# Patient Record
Sex: Male | Born: 1940
Health system: Southern US, Community
[De-identification: ages and names within clinical notes are randomized; demographics above are authoritative.]

## PROBLEM LIST (undated history)

## (undated) DIAGNOSIS — I10 Essential (primary) hypertension: Secondary | ICD-10-CM

## (undated) DIAGNOSIS — M1990 Unspecified osteoarthritis, unspecified site: Secondary | ICD-10-CM

## (undated) DIAGNOSIS — K264 Chronic or unspecified duodenal ulcer with hemorrhage: Secondary | ICD-10-CM

## (undated) DIAGNOSIS — K222 Esophageal obstruction: Secondary | ICD-10-CM

## (undated) DIAGNOSIS — K259 Gastric ulcer, unspecified as acute or chronic, without hemorrhage or perforation: Secondary | ICD-10-CM

## (undated) DIAGNOSIS — K209 Esophagitis, unspecified: Secondary | ICD-10-CM

## (undated) DIAGNOSIS — K449 Diaphragmatic hernia without obstruction or gangrene: Secondary | ICD-10-CM

## (undated) HISTORY — DX: Unspecified osteoarthritis, unspecified site: M19.90

## (undated) HISTORY — PX: COLONOSCOPY: SHX174

## (undated) HISTORY — DX: Gastric ulcer, unspecified as acute or chronic, without hemorrhage or perforation: K25.9

## (undated) HISTORY — PX: REPLACEMENT TOTAL KNEE: SUR1224

---

## 1973-05-07 HISTORY — PX: HERNIA REPAIR: SHX51

## 1998-03-03 ENCOUNTER — Inpatient Hospital Stay (HOSPITAL_COMMUNITY): Admission: EM | Admit: 1998-03-03 | Discharge: 1998-03-05 | Payer: Self-pay | Admitting: Emergency Medicine

## 1998-03-04 ENCOUNTER — Encounter: Payer: Self-pay | Admitting: *Deleted

## 1998-04-05 ENCOUNTER — Encounter: Payer: Self-pay | Admitting: Emergency Medicine

## 1998-04-05 ENCOUNTER — Inpatient Hospital Stay (HOSPITAL_COMMUNITY): Admission: EM | Admit: 1998-04-05 | Discharge: 1998-04-09 | Payer: Self-pay | Admitting: Emergency Medicine

## 1998-04-06 ENCOUNTER — Encounter: Payer: Self-pay | Admitting: Emergency Medicine

## 1998-04-07 ENCOUNTER — Encounter: Payer: Self-pay | Admitting: *Deleted

## 1999-12-04 ENCOUNTER — Encounter: Payer: Self-pay | Admitting: Emergency Medicine

## 1999-12-04 ENCOUNTER — Inpatient Hospital Stay (HOSPITAL_COMMUNITY): Admission: EM | Admit: 1999-12-04 | Discharge: 1999-12-06 | Payer: Self-pay | Admitting: Emergency Medicine

## 2000-04-07 ENCOUNTER — Emergency Department (HOSPITAL_COMMUNITY): Admission: EM | Admit: 2000-04-07 | Discharge: 2000-04-07 | Payer: Self-pay | Admitting: Emergency Medicine

## 2000-05-07 HISTORY — PX: OTHER SURGICAL HISTORY: SHX169

## 2000-11-17 ENCOUNTER — Emergency Department (HOSPITAL_COMMUNITY): Admission: EM | Admit: 2000-11-17 | Discharge: 2000-11-17 | Payer: Self-pay | Admitting: Unknown Physician Specialty

## 2003-10-15 ENCOUNTER — Emergency Department (HOSPITAL_COMMUNITY): Admission: EM | Admit: 2003-10-15 | Discharge: 2003-10-15 | Payer: Self-pay | Admitting: *Deleted

## 2003-10-22 ENCOUNTER — Ambulatory Visit (HOSPITAL_COMMUNITY): Admission: RE | Admit: 2003-10-22 | Discharge: 2003-10-22 | Payer: Self-pay | Admitting: Orthopaedic Surgery

## 2003-11-02 ENCOUNTER — Encounter: Admission: RE | Admit: 2003-11-02 | Discharge: 2004-01-31 | Payer: Self-pay | Admitting: Orthopaedic Surgery

## 2008-01-22 ENCOUNTER — Inpatient Hospital Stay (HOSPITAL_COMMUNITY): Admission: RE | Admit: 2008-01-22 | Discharge: 2008-01-28 | Payer: Self-pay | Admitting: Orthopaedic Surgery

## 2008-01-26 ENCOUNTER — Ambulatory Visit: Payer: Self-pay | Admitting: Vascular Surgery

## 2008-01-26 ENCOUNTER — Encounter (INDEPENDENT_AMBULATORY_CARE_PROVIDER_SITE_OTHER): Payer: Self-pay | Admitting: Orthopaedic Surgery

## 2008-02-03 ENCOUNTER — Inpatient Hospital Stay (HOSPITAL_COMMUNITY): Admission: EM | Admit: 2008-02-03 | Discharge: 2008-02-11 | Payer: Self-pay | Admitting: Emergency Medicine

## 2008-02-03 ENCOUNTER — Ambulatory Visit: Payer: Self-pay | Admitting: Infectious Diseases

## 2008-02-06 ENCOUNTER — Encounter: Payer: Self-pay | Admitting: Infectious Diseases

## 2008-04-28 ENCOUNTER — Encounter: Admission: RE | Admit: 2008-04-28 | Discharge: 2008-04-28 | Payer: Self-pay | Admitting: Orthopedic Surgery

## 2009-12-06 IMAGING — CR DG CHEST 2V
2 series · 2 of 2 positions shown · non-contrast
Comparison: None

CLINICAL DATA: Preop chest radiograph

CHEST - 2 VIEW

[view not recorded (1 of 2)]
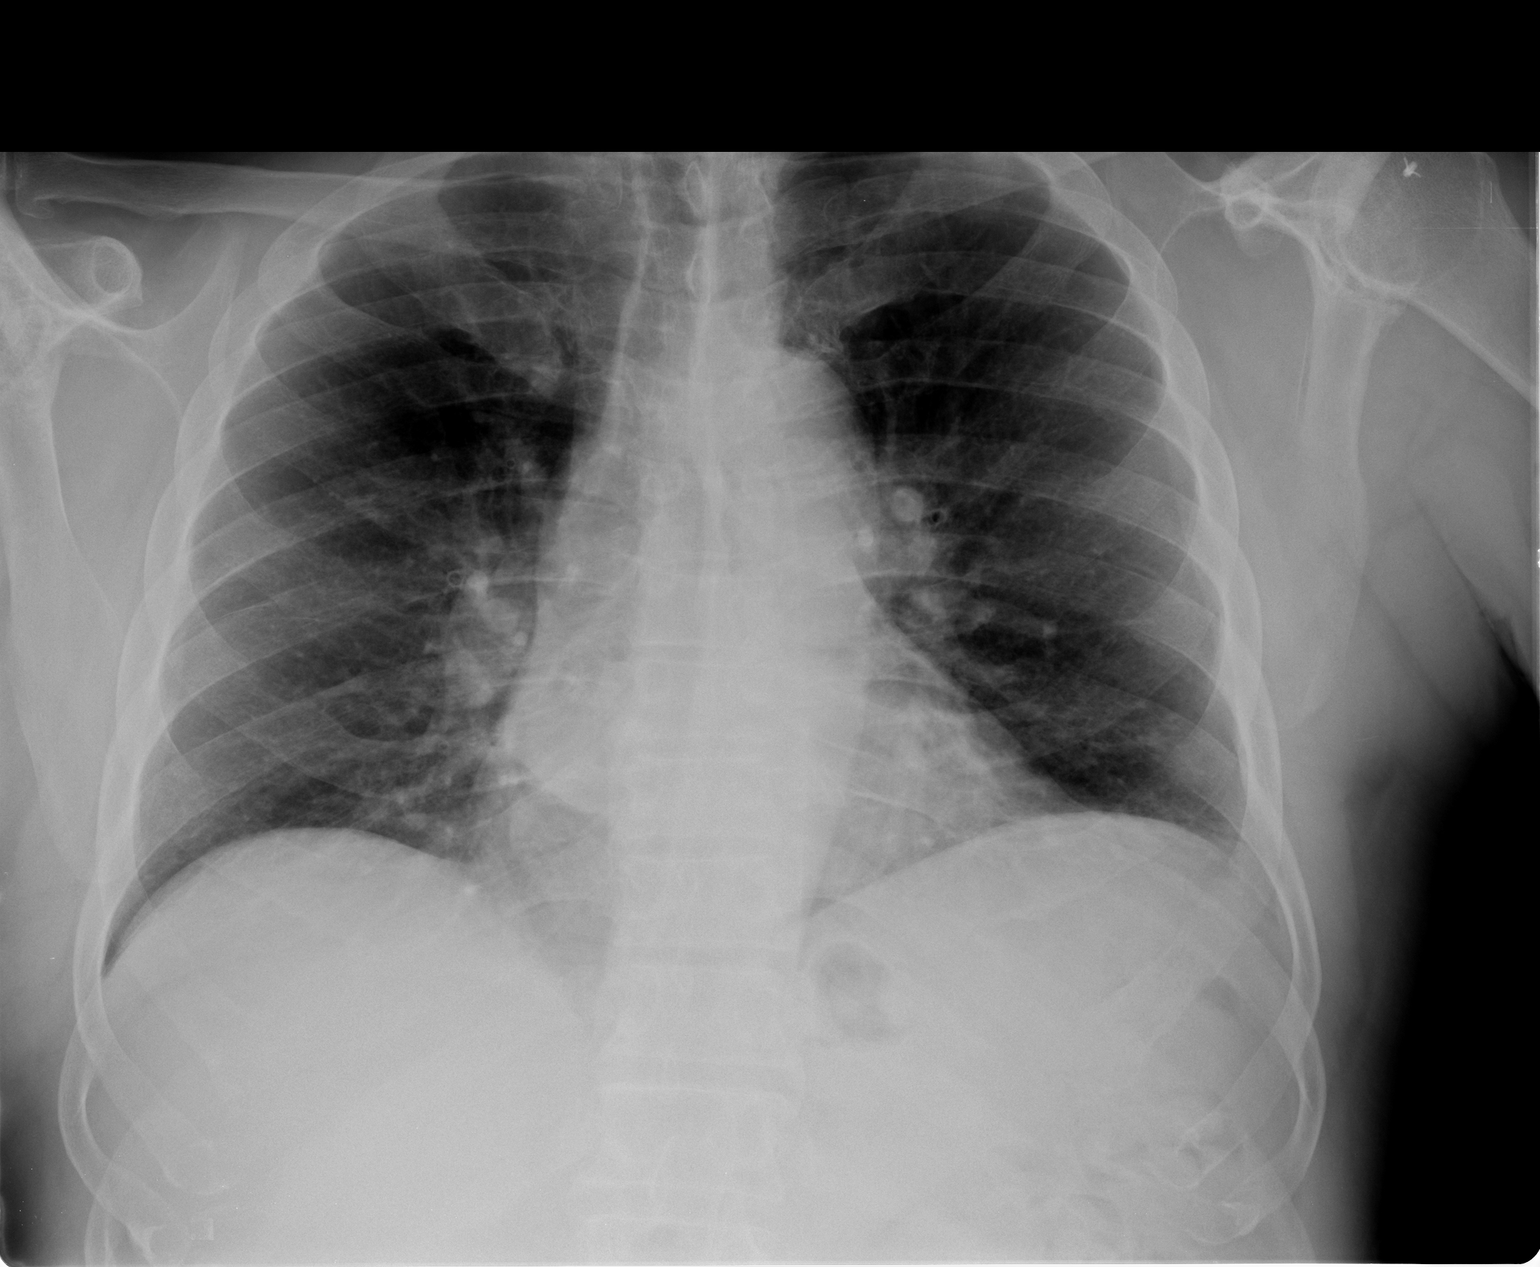

[view not recorded (2 of 2)]
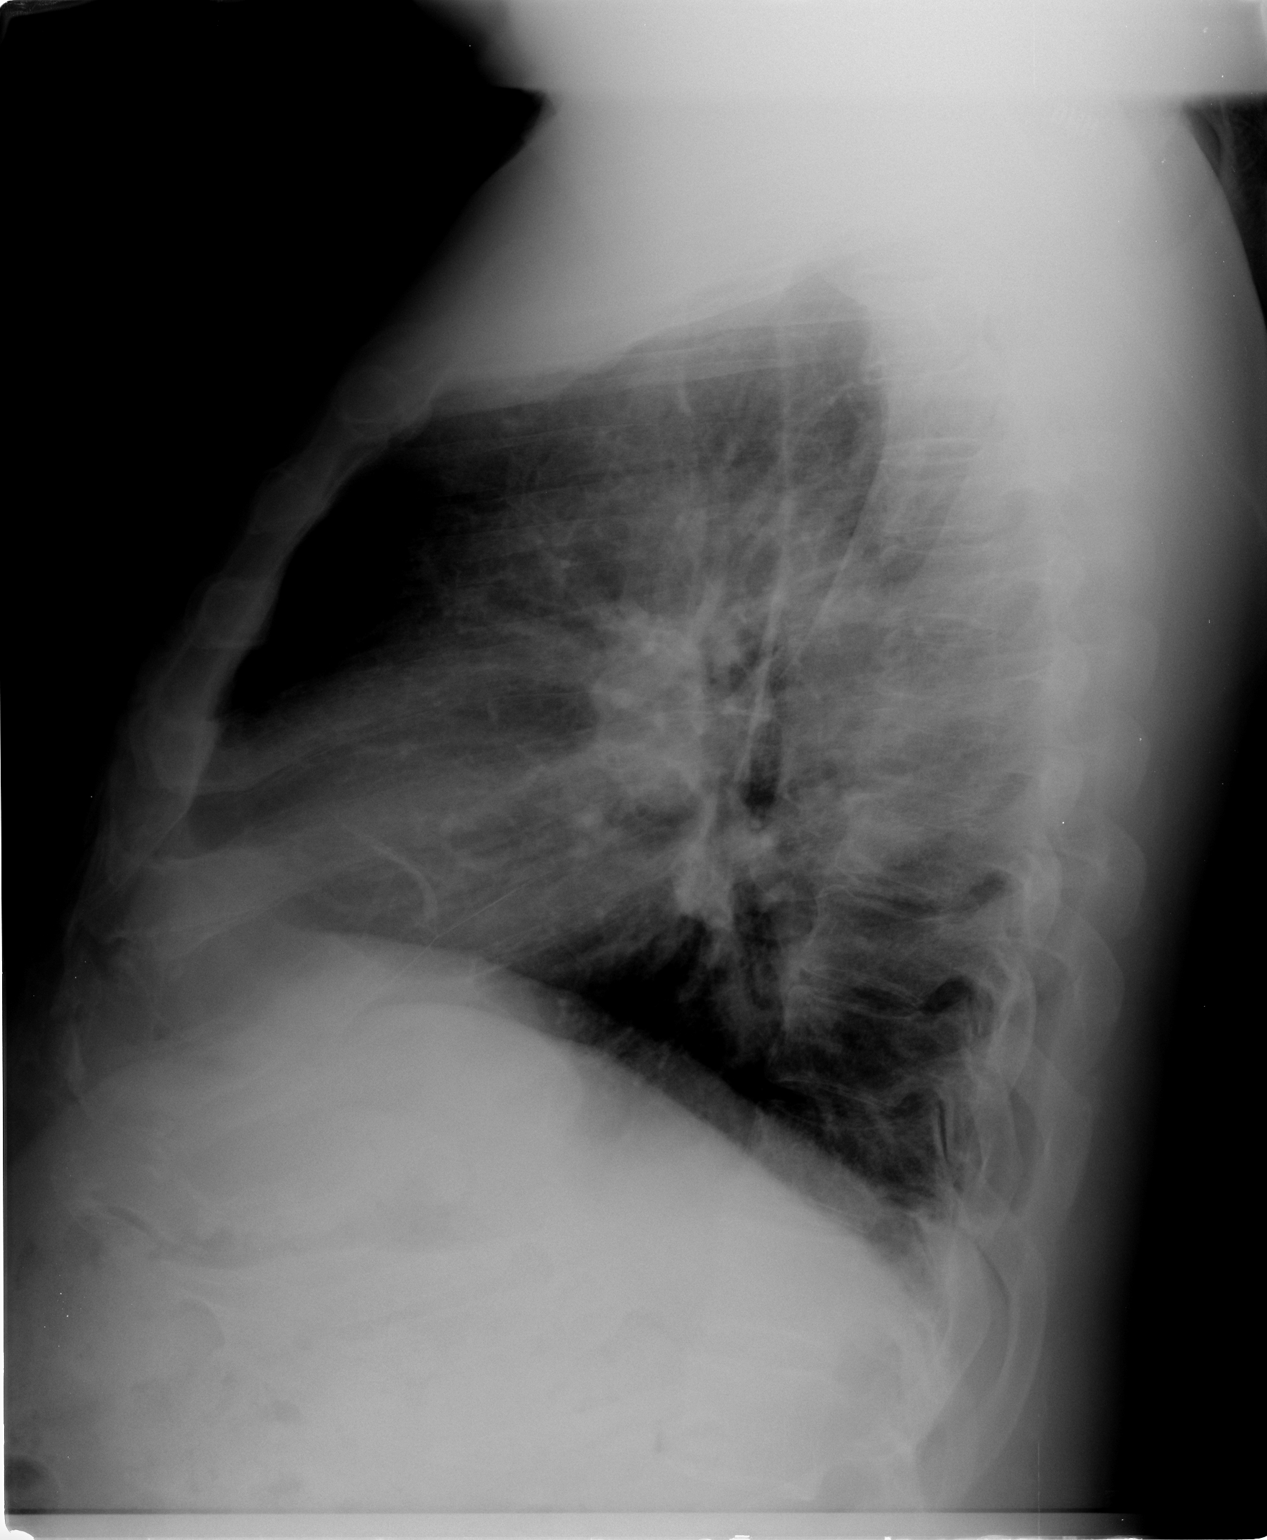

[2 of 2 positions shown; findings below may reference images not displayed]

FINDINGS: Heart size is normal.

There is no pleural effusion or pulmonary interstitial edema

No airspace densities identified.

There is a right hilar prominence which is best appreciated on the
lateral view.

No airspace densities or pulmonary masses noted.
IMPRESSION: 1.  Right hilar prominence, cannot rule out adenopathy..  Recommend
CT of the chest for more definitive evaluation.
2.  No acute airspace density.

## 2009-12-15 IMAGING — CR DG CHEST 2V
1 series · 1 of 1 positions shown · non-contrast
Comparison: Chest radiograph 01/16/2008

CLINICAL DATA: DJD left knee, productive cough

CHEST - 2 VIEW

[view not recorded]
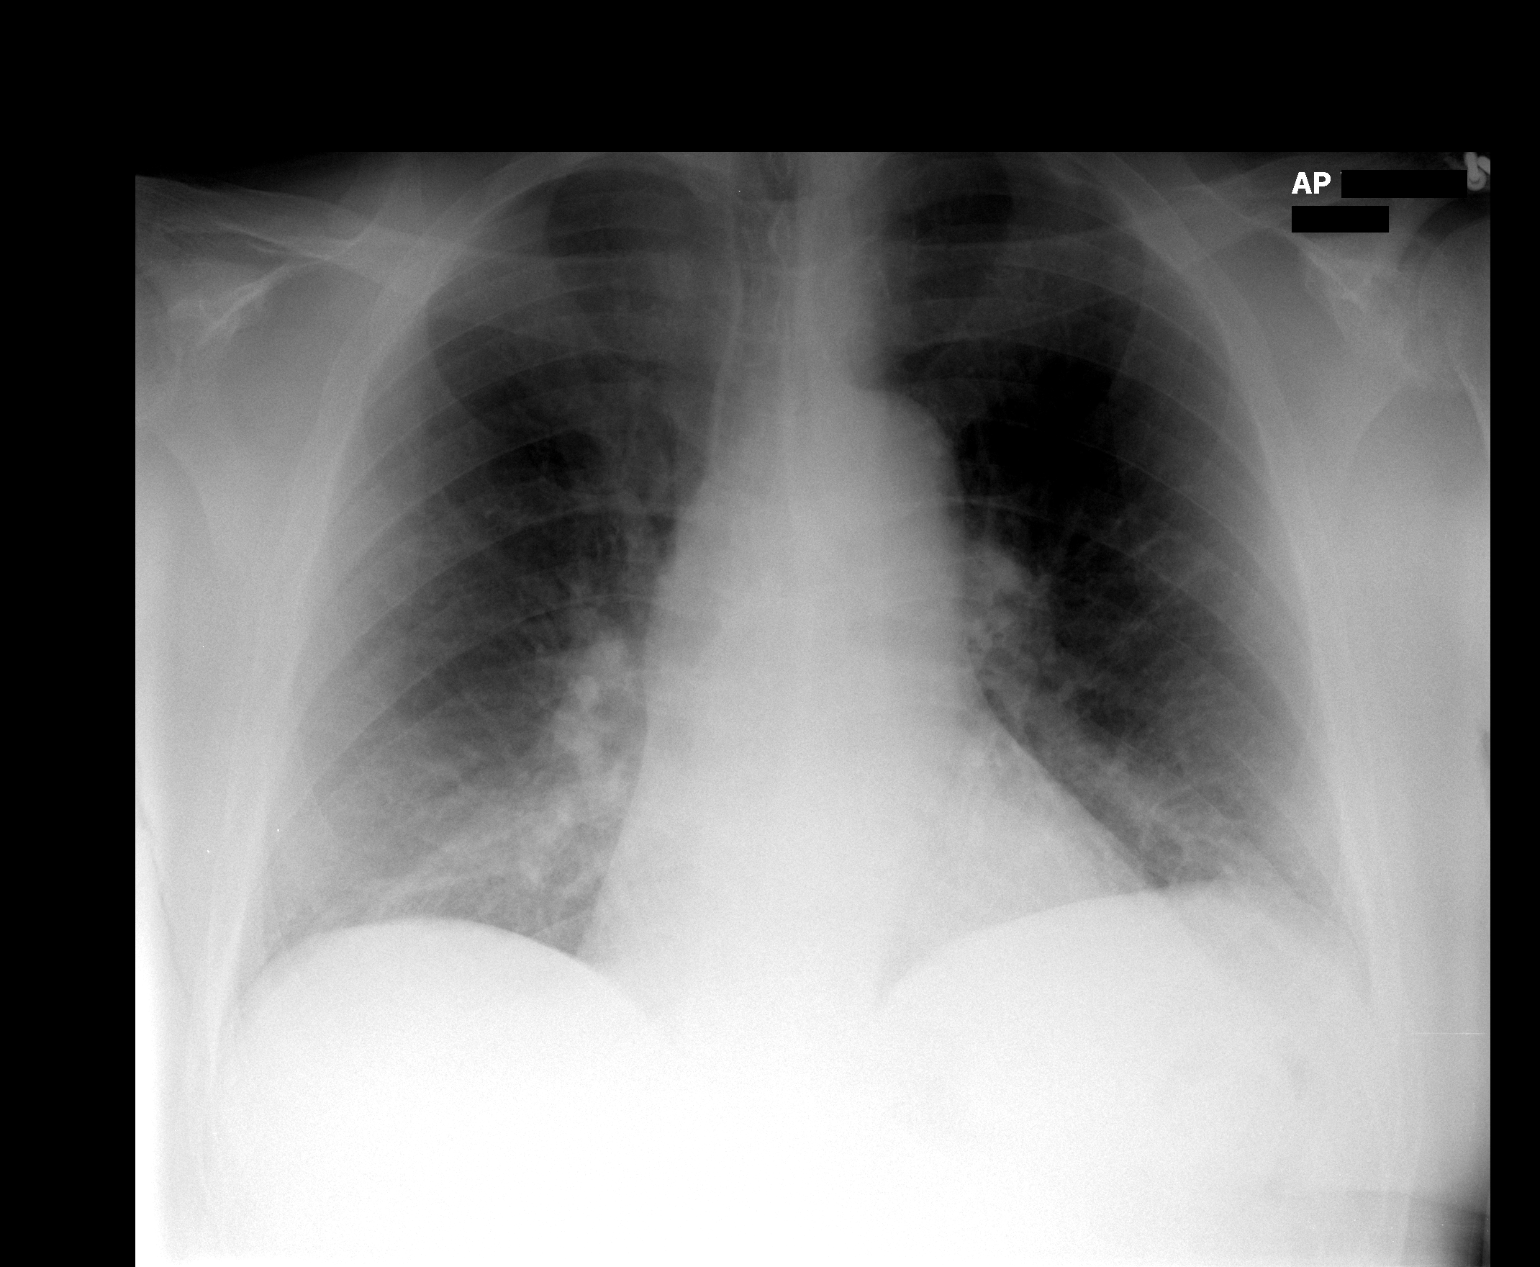

[1 of 1 positions shown; findings below may reference images not displayed]

FINDINGS: Mildly enlarged cardiac silhouette.  There are linear
markings at the bilateral lung bases slightly increased from prior.
No evidence of pulmonary edema.  No pneumothorax.  Lateral
projection demonstrates a potential 3 cm opacity projecting over
the anterior border of the thoracic spine.
IMPRESSION: 1..  Increasing bilateral lobe linear opacities representing
atelectasis or infiltrates.

2.  Potential 3 cm opacity projecting over the lower lobes.    This
may represent a focus of infection however cannot exclude a
pulmonary mass or lymph node.  Recommend either repeat PA and
lateral radiographs in short term or consider CT thorax.

## 2010-04-08 ENCOUNTER — Emergency Department (HOSPITAL_COMMUNITY)
Admission: EM | Admit: 2010-04-08 | Discharge: 2010-04-08 | Payer: Self-pay | Source: Home / Self Care | Admitting: Emergency Medicine

## 2010-07-18 LAB — GC/CHLAMYDIA PROBE AMP, GENITAL
Chlamydia, DNA Probe: NEGATIVE
GC Probe Amp, Genital: NEGATIVE

## 2010-09-19 NOTE — Op Note (Signed)
NAME:  Adrian Miller, WOJNAROWSKI NO.:  [id number]   MEDICAL RECORD NO.:  [id number]          PATIENT TYPE:  INP   LOCATION:  5033                         FACILITY:  [facility]   PHYSICIAN:  Lubertha Basque. Dalldorf, M.D.DATE OF BIRTH:  Apr 07, 1941   DATE OF PROCEDURE:  01/22/2008  DATE OF DISCHARGE:                               OPERATIVE REPORT   PREOPERATIVE DIAGNOSIS:  Left knee degenerative arthritis.   POSTOPERATIVE DIAGNOSIS:  Left knee degenerative arthritis.   PROCEDURE:  Left total knee replacement.   ANESTHESIA:  General and block.   ATTENDING SURGEON:  Lubertha Basque. Jerl Santos, MD   ASSISTANT:  Lindwood Qua, PA   INDICATIONS FOR PROCEDURE:  The patient is a 70 year old male with a  long history of painful knees.  By x-ray he has advanced degenerative  change, medial and patellofemoral.  He has failed oral anti-  inflammatories and injections.  He has pain which limits his ability to  rest and walk.  He is offered a knee replacement to the most painful  side which is the left.  Informed operative consent was obtained after  discussion of possible complications of reaction to anesthesia,  infection, DVT, PE, and death.   SUMMARY, FINDINGS, AND PROCEDURE:  Under general anesthesia and a block,  a left knee replacement was performed through a standard anterior  longitudinal incision and medial parapatellar approach.  He had advanced  degenerative change, medial and patellofemoral, but excellent bone  quality.  We addressed this problem with a cemented DePuy LCS system  using a large femur, 10-mm deep dish spacer, 5 tibia, and 38 mm all-  polyethylene patella.  We did include antibiotic in the cement.  Lindwood Qua assisted throughout and was invaluable to the completion of the  case in that he helped to position and retract while I performed the  procedure.  He also closed simultaneously to help minimize OR time.   DESCRIPTION OF PROCEDURE:  The patient was taken to  the operating suite  where general anesthetic was applied without difficulty.  He was also  given a block in the preanesthesia area.  He was positioned in supine  and prepped and draped in the normal sterile fashion.  After the  administration of preop IV Kefzol, the left leg was elevated,  exsanguinated, and tourniquet was inflated about the thigh.  A  longitudinal anterior incision was made with dissection down to the  extensor mechanism.  A medial parapatellar incision was made in this  structure.  The kneecap was flipped and the knee was flexed.  Residual  meniscal tissues were removed along with ACL and PCL.  Findings were as  noted above.  We made a cut on the tibia with an extramedullary guide  with a slight posterior tilt.  We then made a cut on the femur with an  intramedullary guide, removing an anterior-posterior bone, creating a  flexion gap of 10 mm.  We then made a second cut on the femur with a  distal guide also intramedullary.  This cut balanced the knee at 10 mm.  The femur  sized to a large and the tibia to a 4 and the appropriate  guides were placed and utilized.  The patella was cut down to thickness  by 9 mm to 15 and sized to a 38 with the appropriate guide placed and  utilized.  A trial reduction was done with these components.  The knee  easily came to full extension and flexed well and the patella tracked  well.  Trial components were removed followed by pulsatile lavage and  irrigation of 3 cut bony surfaces.  Cement was mixed including Zinacef  antibiotic 1.5 g.  The cement was pressurized onto the bones followed by  placement of the aforementioned DePuy LCS components.  Pressure was held  on the components until cement hardened.  Excess cement was trimmed.  The tourniquet was deflated and a small amount of bleeding was easily  controlled with Bovie cautery.  The knee was irrigated followed by  placement of drain exiting superolaterally.  The extensor mechanism  was  reapproximated with #1 Vicryl interrupted fashion followed by  subcutaneous reapproximation with 0 and 2-0 undyed Vicryl and skin  closure with staples.  The knee was easily flexed to 120 against gravity  at the end of the case.  Estimated blood loss and intraoperative fluids  could be obtained from anesthesia records as could the accurate  tourniquet time.   DISPOSITION:  The patient was extubated in the operating room and taken  to the recovery room in stable addition.  He is to be admitted to the  Orthopedic Surgery Service for appropriate postop care to include  perioperative antibiotics and Coumadin plus Lovenox for DVT prophylaxis.      Lubertha Basque Jerl Santos, M.D.  Electronically Signed     PGD/MEDQ  D:  01/22/2008  T:  01/23/2008  Job:  [id number]

## 2010-09-19 NOTE — Consult Note (Signed)
NAME:  Adrian Miller, Adrian Miller NO.:  [id number]   MEDICAL RECORD NO.:  [id number]          PATIENT TYPE:  INP   LOCATION:  5033                         FACILITY:  [facility]   PHYSICIAN:  Isidor Holts, M.D.  DATE OF BIRTH:  12-28-1940   DATE OF CONSULTATION:  01/26/2008  DATE OF DISCHARGE:                                 CONSULTATION   PMD:  Lubertha Basque. Jerl Santos, M.D.   REQUESTING PHYSICIAN:  Lubertha Basque. Jerl Santos, M.D., orthopedic surgeon.   REASON FOR CONSULTATION:  Possible chest infection.   HISTORY OF PRESENT ILLNESS:  This is a 70 year old male admitted to  [facility] on January 22, 2008, for end-stage  osteoarthritis of his left knee and is now status post left knee total  knee replacement on January 22, 2008.  We are requested for consult  for possible chest infection.  On detailed questioning, patient admits  to cough productive of mucky gray-brown phlegm for three days,  associated with fever, sweats, a feeling of cold.  He also has  bilateral pleuritic-type chest pain.   PAST MEDICAL HISTORY:  1. Hypertension.  2. Dyslipidemia.  3. Coronary artery disease, status post NSTEMI in 1999.  4. Status post coronary catheterization on November 25, 1999, which showed      nonobstructive coronary disease and diastolic dysfunction.  5. History of skull trauma in 1994, i.e., occupational.  6. Smoking history.  7. Osteoarthritis.  8. Erectile dysfunction.   PRE-ADMISSION MEDICATIONS:  1. Viagra 100 mg p.o. p.r.n.  2. Tramadol 50 mg p.o. p.r.n. q.6h.  3. Hydrochlorothiazide 25 mg p.o. daily.  4. Tarka-240, 1 p.o. daily.  5. Goody's powders p.r.n.   CURRENT MEDICATIONS:  1. Therapeutic Lovenox.  2. Coumadin per INR.  3. Dilaudid PCA.  4. Colace 100 mg p.o. b.i.d.  5. Ferrous sulfate 325 mg p.o. t.i.d.  6. Verapamil 240 mg p.o. daily.  7. Trandolapril 4 mg p.o. daily.   ALLERGIES:  No known drug allergies.   REVIEW OF SYSTEMS:  Patient denies  abdominal pain, vomiting, or  diarrhea.   SOCIAL HISTORY:  Patient is a smoker, and has been smoking for many  years.  Continues to smoke about a half a pack of cigarettes per day.  Alcohol:  Drinks only occasionally.   FAMILY HISTORY:  This is positive for coronary artery disease, patient's  sister having died status post MI at age 38; however, noncontributory.   PHYSICAL EXAMINATION:  VITALS:  Temperature 99.4 (was 101.5 on January 25, 2008).  Pulse 88 per minute.  Respiratory 18.  BP 108/56 mmHg.  Pulse oximetry 95% on room air.  Patient did not appear to be in obvious acute distress at the time of  this evaluation.  Alert, communicative.  Not short of breath at rest.  HEENT:  No clinical pallor.  No jaundice.  No conjunctival injection.  Throat is clear.  NECK:  Supple.  JVP not seen.  No palpable lymphadenopathy.  No palpable  goiter.  CHEST:  Clinically clear to auscultation.  No wheezes, no crackles.  HEART:  Sounds S1 and  S2 heard.  Normal, regular, no murmurs.  ABDOMEN:  Full, soft, nontender.  No palpable organomegaly.  No palpable  masses.  Normal bowel sounds.  LOWER EXTREMITIES:  Right lower extremity is unremarkable.  Left lower  extremity:  Postop dressing is noted over the knee incision.  Patient  has moderate edema, left lower extremity and a knot that is palpable in  the popliteal fossa, which might represent superficial thrombophlebitis.  CENTRAL NERVOUS SYSTEM:  No focal neurologic deficit on gross  examination.   INVESTIGATIONS:  CBC:  WBC 16, hemoglobin 11.5, hematocrit 34.4,  platelets 260, INR 1.5.  Electrolytes on January 25, 2008 shows sodium  136, potassium 3.9, chloride 100, CO2 27, BUN 15, creatinine 1.36,  glucose 104.  Urinalysis shows WBCs 0-2, otherwise negative.   Chest x-ray detailed on January 25, 2008 shows increase of bilateral  linear opacities, consistent with infiltrates versus atelectasis.  Also,  a 3-cm opacity projecting over the  lower lobes, which may represent  focal infection versus mass.   ASSESSMENT/PLAN/RECOMMENDATIONS:  1. Pneumonia:  Patient clinically appears to have a nosocomial      pneumonia, as indicated by significant pyrexia, productive cough,      pleuritic-type chest pain.  We shall therefore commence patient on      broad-spectrum intravenous antibiotic therapy with Vancomycin and      Zosyn, p.r.n. bronchodilator nebulizers and oxygen supplementation.      To further elucidate pulmonary opacity, we shall arrange chest CT      angiogram, as this will also assist in excluding pulmonary      embolism, for which this patient is clearly susceptible.   1. Hypertension:  This is controlled.  We should continue the present      treatment.   1. Smoking history:  Patient has been counseled.  He does not show any      inclination to quit.  We shall utilize Nicoderm CQ patch, during      this hospitalization.   1. Dyslipidemia:  Patient was not on any lipid-lowering medication on      admission.  We shall therefore check a lipid profile, and institute      appropriate treatment, if indicated.   1. End-stage osteoarthritis, left knee:  Postoperative day #4.  Manage      per orthopedic surgeon.  Patient appears to be comfortable pain-      wise.  We shall defer pain management to orthopedic surgeons.   1. Left lower extremity edema:  This is clearly suspicious for DVT,      although patient is currently on therapeutic Lovenox and      concomitant Coumadin.  Primary MD has already arranged lower      extremity venous Doppler.  We agree with this.   Thank you for this consultation.  Will follow with you.      Isidor Holts, M.D.  Electronically Signed     CO/MEDQ  D:  01/26/2008  T:  01/26/2008  Job:  [id number]

## 2010-09-19 NOTE — Op Note (Signed)
NAME:  Adrian Miller, Adrian Miller NO.:  [id number]   MEDICAL RECORD NO.:  [id number]          PATIENT TYPE:  INP   LOCATION:  5021                         FACILITY:  [facility]   PHYSICIAN:  Lubertha Basque. Dalldorf, M.D.DATE OF BIRTH:  June 25, 1940   DATE OF PROCEDURE:  02/06/2008  DATE OF DISCHARGE:                               OPERATIVE REPORT   PREOPERATIVE DIAGNOSIS:  Left knee infection.   POSTOPERATIVE DIAGNOSIS:  Left knee infection.   PROCEDURE:  Left knee arthroscopic debridement.   ANESTHESIA:  General.   ATTENDING SURGEON:  Lubertha Basque. Dalldorf, MD   INDICATIONS FOR PROCEDURE:  The patient is a 70 year old man about 2  weeks from a knee replacement.  We admitted him with an inflamed and  reddened left knee about 4 days ago.  An aspiration of his knee revealed  about 15,000 white cells.  This was repeated yesterday and was slightly  higher at about 25,000.  The cultures had been no growth and a gram  stain has not shown any organisms.  He has not responded to IV  vancomycin and persists with some low-grade temperatures and pain.  We  elected to go ahead and irrigate and debride the knee.  Informed  operative consent was obtained after discussion of possible  complications of reaction to anesthesia and continued infection.   SUMMARY, FINDINGS, AND PROCEDURE:  Under general anesthesia, an  arthroscopic lavage of the left knee was performed.  There was really no  fluid in the knee just some clotted blood.  This was all removed with  thorough irrigation with saline and antibiotic solution.  Drains were  placed at the end of the case.   DESCRIPTION OF PROCEDURE:  The patient was taken to the operating suite  where general anesthetic was applied without difficulty.  He was  positioned supine and prepped and draped in normal sterile fashion.  He  was already on IV vancomycin and no additional perioperative antibiotic  was necessary.  We made 2 small incisions and performed an  arthroscopy  of the knee.  I found some clotted blood but really no significant fluid  in the joint.  We removed the blood clots with a shaver and irrigated  with 6 liters of saline followed by about a liter of antibiotic  solution.  I placed 2 large Hemovac drains through the portals.  These  were tied in place with suture.  A sterile dressing was applied.  Estimated blood loss and intraoperative fluids obtained from anesthesia  records.   DISPOSITION:  The patient was extubated in the operating room and taken  to recovery room in stable addition.  He is being admitted back to  Orthopedic Surgery Service for appropriate postop care to include  continued vancomycin plus Rocephin and Coumadin for DVT prophylaxis.      Lubertha Basque Jerl Santos, M.D.  Electronically Signed     PGD/MEDQ  D:  02/06/2008  T:  02/07/2008  Job:  [id number]

## 2010-09-22 NOTE — Discharge Summary (Signed)
NAME:  Adrian Miller, Adrian Miller NO.:  [id number]   MEDICAL RECORD NO.:  [id number]          PATIENT TYPE:  INP   LOCATION:  5033                         FACILITY:  [facility]   PHYSICIAN:  Lubertha Basque. Dalldorf, M.D.DATE OF BIRTH:  03-31-1941   DATE OF ADMISSION:  01/22/2008  DATE OF DISCHARGE:  01/28/2008                               DISCHARGE SUMMARY   ADMITTING DIAGNOSES:  1. Left knee end-stage degenerative joint disease.  2. Hypertension.  3. Hyperlipidemia.  4. Tobacco abuse.   DISCHARGE DIAGNOSES:  1. Left knee end-stage degenerative joint disease.  2. Hypertension.  3. Hyperlipidemia.  4. Tobacco abuse.  5. Pneumonia.   BRIEF HISTORY:  Adrian Miller is a patient well-known to our practice.  Adrian Miller  is a 69 year old black male complaining of increasing left knee pain.  His x-rays reveal end-stage DJD.  Adrian Miller is having pain when Adrian Miller walks and  pain when Adrian Miller sleeps.  Adrian Miller has failed oral anti-inflammatory medicines and  corticosteroid injections.   PERTINENT LABORATORY DATA AND X-RAY FINDINGS:  EKG:  Sinus bradycardia.  WBCs 8.1 with a rise to 19.3, hemoglobin 10.6 on discharge, hematocrit  31.5, and platelets at 300.  Regular or serial INRs were drawn as Adrian Miller was  on low-dose Coumadin protocol.  Sodium 138, potassium 4.4, glucose at  82, BUN 14, and creatinine 1.07.  Cholesterol 123.  Chest x-ray:  Increasing bilateral lobe linear opacities representing atelectasis or  infiltrates.  CT of the chest negative for pulmonary embolus or acute  cardiopulmonary disease.  Negative for mass or other evidence of  neoplastic process.   HOSPITAL COURSE:  Adrian Miller was admitted postoperatively and placed on a  variety of p.o. and IM analgesics for pain.  A PCA pump was used with  reduced dose of Dilaudid.  Adrian Miller was given IV Ancef 1 gram q.8 h. x3 doses,  Coumadin, and Lovenox protocol for DVT prophylaxis by Pharmacy and then  appropriate stool softeners, laxatives of choice, p.o. medications for  iron  supplementation, pain relief, antiemetics, CPM machine use 0-50  degrees, advanced as tolerated, knee-high TEDs, incentive spirometry,  and then out of bed with physical therapy to be weightbearing as  tolerated.  During his hospital stay, first day postop, his vital signs  were blood pressure 128/67, temperature 99.  Lungs were clear.  Abdomen  was soft.  Dressing was changed as Adrian Miller had kind of soaked through the  dressing around the area of the drain site, but the drain was left in  and Adrian Miller had a Foley catheter in, but was eating well.  Second day postop,  his dressing was changed again and drain was pulled.  No sign of  infection or irritation.  Adrian Miller did during his hospitalization had some  trouble with nausea and temperature elevation.  We called in a medical  consult which worked him up and diagnosed him as having a pneumonia for  which Adrian Miller was treated with appropriate antibiotics.  At one point, Adrian Miller was  thinking about being discharged to a nursing care facility, but later  someone helped from his family  members and was discharged home.   CONDITION ON DISCHARGE:  Improved.   DISCHARGE INSTRUCTIONS:  Adrian Miller will remain on a low-sodium heart-healthy  diet, may change his dressing daily, any sign of infection will call the  office at [phone] and report this to be seen.  Also, to be seen for  staple removal in 2 weeks.  Adrian Miller will have home physical therapy and blood  draws for INR arranged.  A dose of Coumadin per Pharmacy prior to  discharge.  Adrian Miller is also given a prescription for Percocet one to two q.4-  6 p.r.n. pain and then Adrian Miller is kept on his home medicines which include  Viagra 100 mg daily as needed, hydrochlorothiazide 25 mg as needed,  Tarka 240 daily as needed, and Avelox 400 mg one p.o. daily x10 days.  Followup with his PCP doctor in regards to his pneumonia who is Dr.  Yetta Barre.      Adrian Miller, P.A.      Lubertha Basque Jerl Santos, M.D.  Electronically Signed    MC/MEDQ  D:   03/02/2008  T:  03/02/2008  Job:  [id number]

## 2010-09-22 NOTE — Discharge Summary (Signed)
NAME:  Adrian Miller, Adrian Miller NO.:  [id number]   MEDICAL RECORD NO.:  [id number]          PATIENT TYPE:  INP   LOCATION:  5021                         FACILITY:  [facility]   PHYSICIAN:  Lubertha Basque. Dalldorf, M.D.DATE OF BIRTH:  [birth year]   DATE OF ADMISSION:  02/03/2008  DATE OF DISCHARGE:  02/11/2008                               DISCHARGE SUMMARY   ADMITTING DIAGNOSES:  1. Status post left total knee replacement with cellulitis and      possible septic joint infection.  2. Hypertension.  3. Hyperlipidemia.  4. History of myocardial infarction.  5. Tobacco abuse.   DISCHARGE DIAGNOSES:  1. Status post left total knee replacement with cellulitis and      possible septic joint infection.  2. Hypertension.  3. Hyperlipidemia.  4. History of myocardial infarction.  5. Tobacco abuse.  6. Left total knee replacement cellulitis.   BRIEF HISTORY:  Adrian Miller is a 70 year old black male patient well known  to our practice who had undergone a total knee replacement earlier at  home the week of admission, he started to develop some increasing  redness and pain in his left knee.  Was seen in the office and was  suspicious that he may have had an infection.  He presented to the  emergency room at which time he was admitted to the floor and consult by  Dr. Ninetta Lights from Infectious Disease about consideration of going to the  operating room for I and D.   PERTINENT LABORATORY DATA AND X-RAY FINDINGS:  Abdomen 2 views, no  obstruction of bowel-gas pattern and no free air.  Chest, a PICC line  placement.  WBC initially 17 down to 9.0, hemoglobin 9.1, hematocrit  26.7, platelets 767.  ESR 110.  Serial INRs were done, as he was on low-  dose Coumadin protocol for DVT prophylaxis.  Sodium 137, potassium 3.8,  glucose 104, BUN 8, creatinine 0.97.  Total protein 6.7, albumin 2.5,  amylase 48, lipase 25, other indices were normal.  CK 71.  Synovial  fluid cell count 15,000 and second cell  count 23,000.  Urinalysis,  normal.  Blood cultures, no growth 5 days.  Left knee aspirate and  synovial fluid culture, no growth 3 days.   COURSE IN THE HOSPITAL:  He was admitted actually through the emergency  room and consulted by Dr. Ninetta Lights, put on appropriate antibiotics, and  followed his knee with increasing cell count and redness and discomfort  and made the decision to go to the operating room for I and D, which was  done.  Large 4 drains were left in for 2-3 days postop and he was kept  on appropriate antibiotics, a PICC line was placed, vancomycin in  particular, and he progressed well, was feeling better.  With dressing  change, wound was noted to be benign.  Temperatures all came back within  normal limits and as did his white count.  Physical therapy was ordered  for out of bed.  Weightbearing as tolerated.  He was kept on Coumadin.  Dose regulated by pharmacy.  Before his  discharge from the hospital, his  PICC line was discontinued as recommendation by Dr. Darlina Sicilian.  His  condition on discharge improved.   DISCHARGE INSTRUCTIONS:  Follow up will be on a low-sodium heart-healthy  diet.  May change his dressing daily.  Weightbearing as tolerated.  Call  Dr. Nolon Nations office [phone] for any increasing signs of infection and  for a return appointment visit in 10 days.  He is kept on Coumadin x2  weeks, dose regulated by pharmacy, Percocet 1 or 2 q.4-6 p.r.n. as  needed for pain, doxycycline 100 mg 1 p.o. b.i.d.  Home physical therapy  and INRs were also arranged.  He was kept on his home medications, which  are Viagra p.r.n., hydrochlorothiazide 25 mg daily, verapamil 240 daily.      Lindwood Qua, P.A.      Lubertha Basque Jerl Santos, M.D.  Electronically Signed    MC/MEDQ  D:  03/18/2008  T:  03/18/2008  Job:  [id number]

## 2010-09-22 NOTE — Cardiovascular Report (Signed)
[place]. [facility]  Patient:    Adrian Miller                         MRN: [id number] Proc. Date: 12/05/99 Adm. Date:  [id number] Attending:  Learta Codding CC:         Lewayne Bunting, M.D.   Cardiac Catheterization  PROCEDURES PERFORMED: 1. Left heart catheterization. 2. Selective coronary angiography. 3. Ventriculography.  DIAGNOSES: 1. No flow-limiting coronary artery disease. 2. Elevated left ventricular end-diastolic pressure, consistent    with diastolic dysfunction.  HISTORY:  Mr. Barthold is a 70 year old African-American male with a past medical history of hypertension, hypercholesterolemia and possibility of non-Q-wave myocardial infarction.  The patient had a prior cardiac catheterization in 1999 and was noted to have a 40% LAD lesion; otherwise nonobstructive coronary artery disease.  The patient now presents with recurrent substernal chest pain, increased orthopnea and PND.  She has been referred for diagnostic catheterization to assess his coronary anatomy.  DESCRIPTION OF PROCEDURE:  After an informed consent was obtained, the patient was brought to the catheterization laboratory.  After adequate anesthesia was provided to the right groin, a 6-French arterial sheath was inserted without difficulty.  Subsequently selective coronary angiography was performed using a JL4 and JR4 catheters.  Images were obtained in various projections, using hand injection of contrast.  After selective coronary angiography, a 6-French pigtail catheter was advanced through the arterial sheath and pinto the femoral artery and placed in the left ventricle.  Appropriate left-sided hemodynamics were then obtained.  The left ventriculogram was then performed in single-plane RAO projection.  Subsequently the pigtail catheter was pulled back and removed.  At the termination of the case the sheath was removed and adequate hemostasis was provided.  There were no  complications noted during this procedure.  FINDINGS:  HEMODYNAMICS: 1. Left ventricular pressure:  112/21 mmHg. 2. Aortic pressure:   112/67 mmHg.  VENTRICULOGRAPHY:  No segmental wall motion abnormalities.  Ejection fraction 71%.  SELECTIVE CORONARY ANGIOGRAPHY: 1. LEFT MAIN CORONARY ARTERY:  A large-caliber vessel and free of disease. 2. LEFT ANTERIOR DESCENDING ARTERY:  A moderately-sized vessel, with no    evidence of flow-limiting coronary artery disease.  But, there was a 30%    mid LAD lesion.  Two large diagonal branches arise from the LAD with no    flow-limiting disease. 3. LEFT CIRCUMFLEX CORONARY ARTERY:  The patients coronary system was left    dominant.  The posterior descending artery comes off the left circumflex    artery.  This was a large-caliber vessel with no evidence of flow-limiting    coronary artery disease.  Several obtuse marginal branches have no    flow-limiting coronary artery disease. 4. RIGHT CORONARY ARTERY:  Nondominant vessel with no evidence of    flow-limiting coronary artery disease.  CONCLUSIONS: 1. No evidence of flow-limiting coronary artery disease, with mild    coronary plaquing of the LAD. 2. Elevated left ventricular end-diastolic pressure, consistent with    diastolic dysfunction.  RECOMMENDATIONS:  The patient had no evidence of significant coronary artery disease to explain his symptoms.  He does have significant diastolic dysfunction; his predominant symptoms are orthopnea and PND.  The patient will need to be treated medically with ACE inhibitors, beta blockers and hydrochlorothiazide has been added to this regimen.  The patient can refer to Dr. Andee Lineman in the office. DD:  12/05/99 TD:  12/05/99 Job: [id number]

## 2010-09-22 NOTE — Discharge Summary (Signed)
[place]. [facility]  Patient:    Adrian Miller                         MRN: [id number] Adm. Date:  [id number] Disc. Date: [id number] Attending:  Learta Codding Dictator:   Joellyn Rued, P.A.C. CC:         Lewayne Bunting, M.D.   Discharge Summary  DATE OF BIRTH: 10-12-1940  HISTORY OF PRESENT ILLNESS: Adrian Miller is a 70 year old black male, with a past medical history of hypertension and hyperlipidemia, possible non-Q wave MI in 1999.  He underwent cardiac catheterization at that time; however, results were not available.  He has done well until the last couple of months.  He was being awakened at night with a squeezing sensation in his chest, with associated feeling that his breathing was being cut off, lasting several minutes.  Sitting up improved his symptoms.  It occasionally radiated to the back and left shoulder.  He states he feels like it is 18-wheeler driving over his chest.  His symptoms are not necessarily worsened by exertion.  He does complain of orthopnea and PND as well as dyspnea on exertion.  He has not been compliant with medical care and has not followed up with a cardiologist since Dr. Weston Anna premature death.  LABORATORY DATA: EKG showed sinus bradycardia, T wave inversion in V3 through V6, with flattening inferiorly.  Chest x-ray showed mild interstitial prominence without focal infiltrates or heart failure.  Hemoglobin 16.2, hematocrit 47.5, normal indices; platelets 301,000; WBC 9.7. PT 13.2, PTT 32.  Sodium 138, potassium 3.9, BUN 12, creatinine 1.0, glucose 118.  CKs and troponins were negative for myocardial infarction.  Fasting lipids showed a total cholesterol of 181, triglyceride 287, HDL 30, LDL 94.  HOSPITAL COURSE: The patient was admitted overnight for observation.  He did not have any further discomfort and enzymes and EKGs were negative for myocardial infarction.  It was also noted that he had some rectal bleeding  and would require outpatient GI evaluation.  It was felt that his chest discomfort was atypical; however, he underwent cardiac catheterization on December 05, 1999. This did not show any evidence of flow limiting coronary artery disease, with mild coronary plaque of the LAD noted.  He did have elevated left ventricular end diastolic pressure consistent with diastolic dysfunction.  Dr. Andee Lineman felt he should be treated medically with ACE inhibitors, beta-blockers, and hydrochlorothiazide.  After sheath removal and best rest the patients catheterization site was intact and he was ambulating without difficulty; thus, Dr. Andee Lineman felt he could be discharged home on December 05, 1999.  DISCHARGE DIAGNOSES:  1. Exertional shortness of breath, possibly related to diastolic dysfunction.  2. Hypertension.  3. Rectal bleeding.  DISPOSITION: He is discharged home.  DISCHARGE MEDICATIONS:  1. Hydrochlorothiazide 25 mg q.d.  2. Altace 2.5 mg q.d.  3. Lopressor 50 mg 1/2 tablet b.i.d.  4. Aspirin 325 mg q.d.  DISCHARGE ACTIVITY: He was advised to do no lifting, driving, sexual activity, or heavy exertion x 2 days.  DISCHARGE DIET: Maintain low-fat/low-salt/low-cholesterol diet.  FOLLOW-UP: If he has any problems with his catheterization site he is asked to call us immediately.  He was started that he may go to the office after 9 a.m. tomorrow morning, or the morning following discharge, but before 11 a.m. to obtain samples that may be available of the above.  He will see Dr. Andee Lineman in the office  and he was asked to call for an appointment in the morning after he is discharged.DD:  01/05/00 TD:  01/05/00 Job: [id number]

## 2010-09-22 NOTE — H&P (Signed)
[place]. [facility]  Patient:    Adrian Miller                         MRN: [id number] Adm. Date:  [id number] Attending:  Learta Codding CC:         Lewayne Bunting, M.D.                         History and Physical  CHIEF COMPLAINT: Substernal chest pain and upper epigastric pain for several months, with worsening symptoms over the last two weeks.  HISTORY OF PRESENT ILLNESS: [name] is a 70 year old African-American male with a past medical history of hypertension, hypercholesterolemia, and possible non-Q wave myocardial infarction.  The patient had a prior admission in 1999 due to non-Q wave MI and underwent a cardiac catheterization.  The results, however, are unavailable at the present time.  The patient has been doing well since that time with no recurred substernal chest pain.  However, over the last couple of months the patient complains of, particularly at night, being awakened by a squeezing sensation in the chest associated with feeling that his breathing is cut off.  This usually lasts several minutes and the patient has to sit up at the bedside in order to improve his symptoms.  He also has occasional substernal chest pain with radiation to the back and some in the left shoulder.  Again, this is also associated with shortness of breath.  The patient describes it as feeling like an 18-wheeler is driving over his chest when he has this tightness and when he feels his throat is closing up on him.  However, it does not appear his symptoms are necessarily worsened by exertion.  He does complain of orthopnea and PND, and dyspnea on exertion.  The patient has been somewhat noncompliant with his medical care and has not seen a cardiologist or primary care doctor in the last two years since Dr. Weston Anna premature death.  The patient has a prior history of syncope and has been worked up for this several years ago.  He has had no recent episodes of  syncope.  He denies any palpitations or presyncope.  He does report over the last several months having seen blood in the stools.  On occasional he also has reported melena.  ALLERGIES: No known drug allergies.  MEDICATIONS:  1. Aspirin.  2. Alka-Seltzer.  3. BC and Goody Powders.  PAST MEDICAL HISTORY:  1. Positive for non-Q wave myocardial infarction, status post catheterization     in 1999, results unavailable, performed by Dr. Daisy Floro.  2. Hypertension.  3. Hypercholesterolemia.  4. Tobacco use.  5. History of syncope with negative work-up including negative neurological     work-up.  6. History of CNS trauma related to patients occupation involving     significant skull injury in 1994.  FAMILY HISTORY: Positive for CAD, with sister having died of MI at age 54.  SOCIAL HISTORY: The patient has been unable to work over the last few years since his trauma.  He used to work with heavy equipment.  He continues to smoke and occasionally drinks.  There is no history of drug use.  REVIEW OF SYSTEMS: Notable for bright red blood per rectum for the last several months and occasional melena.  Epigastric pain, for which the patient takes Alka-Seltzer, BC and Goody Powders with some relief of his symptoms.  No  fever or chills.  No nausea or vomiting.  The remainder of the Review Of Systems is as per the History of Present Illness.  PHYSICAL EXAMINATION:  VITAL SIGNS: Temperature 98.1 degrees, heart rate 70-80 beats per minute, respirations 18, blood pressure 180/110 mmHg.  GENERAL: Obese black male in no apparent distress.  HEENT: No JVD or jugular reflux.  Normal carotid upstrokes.  No carotid bruits.  NECK: Supple.  LUNGS: Clear breath sounds bilaterally.  HEART: Regular rate and rhythm, normal S1 and S2.  Soft 1/6 systolic murmur at the left upper sternal border.  No S3 or S4.  ABDOMEN: Soft, nontender.  Positive epigastric tenderness but no rebound or guarding.  Good  bowel sounds.  GU: Examination not performed.  EXTREMITIES: There are 2+ peripheral pulses.  No clubbing or cyanosis but there is trace edema.  SKIN: No lesions.  NEUROLOGIC: The patient is alert and oriented.  Cranial nerves 2-12 grossly nonfocal.  Neurologic examination otherwise nonfocal.  LABORATORY DATA: Chest x-ray shows no evidence of congestive heart failure, mild cardiomegaly.  A 12 lead electrocardiogram showed normal sinus rhythm with nonspecific T wave changes but dynamic changes compared to a tracing earlier in the day.  Sodium was 142, potassium 4.1, chloride 106, bicarbonate 30, BUN 11, creatinine 1.3, blood sugar 117.  Hemoglobin 17, hematocrit 50.  The first set of cardiac enzymes obtained was within normal limits.  IMPRESSION/PLAN:  1. Substernal chest pain.  The patients chest pain syndrome is somewhat     atypical, particularly his epigastric pain which resolves with taking     BC Powders and Alka-Seltzer.  However, he does report a feeling as if     an 18-wheeler is driving over his chest and he clearly has symptoms of     orthopnea and paroxysmal nocturnal dyspnea and nocturnal chest pain.     Given the patients history and profile and prior history of non-Q wave     myocardial infarction, aggressive cardiac work-up is indicated.  The     patient will be admitted for rule out myocardial infarction and we have     discussed proceeding with cardiac catheterization, particularly in light     of some of the dynamic T wave changes noted by 12 lead electrocardiogram.     The patient will be kept NPO and will probably undergo cardiac     catheterization in the morning.  In the interim he will be treated with     aspirin, beta-blockers, and ACE inhibitors.  We will hold off on     anticoagulation due to his report of bright red blood per rectum.  2. Hypertension.  Initiation of beta-blockers and aspirin as outlined above.  3. Rectal bleeding.  The patient will  require gastrointestinal evaluation     and we will obtain consultation during this admission.  Will hold off on      anticoagulation for the time being.  4. Hypercholesterolemia.  Fasting lipid panel will be obtained.  The patient     likely will need to be started on Statin drug therapy. DD:  12/04/99 TD:  12/05/99 Job: [id number]

## 2010-10-28 ENCOUNTER — Emergency Department (HOSPITAL_COMMUNITY): Payer: Medicare Other

## 2010-10-28 ENCOUNTER — Observation Stay (HOSPITAL_COMMUNITY)
Admission: EM | Admit: 2010-10-28 | Discharge: 2010-10-30 | Disposition: A | Discharge status: Home / Self Care | Payer: Medicare Other | Attending: Internal Medicine | Admitting: Internal Medicine

## 2010-10-28 DIAGNOSIS — J438 Other emphysema: Secondary | ICD-10-CM | POA: Insufficient documentation

## 2010-10-28 DIAGNOSIS — I10 Essential (primary) hypertension: Secondary | ICD-10-CM | POA: Insufficient documentation

## 2010-10-28 DIAGNOSIS — E785 Hyperlipidemia, unspecified: Secondary | ICD-10-CM | POA: Insufficient documentation

## 2010-10-28 DIAGNOSIS — R5383 Other fatigue: Secondary | ICD-10-CM | POA: Insufficient documentation

## 2010-10-28 DIAGNOSIS — R509 Fever, unspecified: Secondary | ICD-10-CM | POA: Insufficient documentation

## 2010-10-28 DIAGNOSIS — G8929 Other chronic pain: Secondary | ICD-10-CM | POA: Insufficient documentation

## 2010-10-28 DIAGNOSIS — R5381 Other malaise: Secondary | ICD-10-CM | POA: Insufficient documentation

## 2010-10-28 DIAGNOSIS — R0789 Other chest pain: Principal | ICD-10-CM | POA: Insufficient documentation

## 2010-10-28 DIAGNOSIS — I517 Cardiomegaly: Secondary | ICD-10-CM | POA: Insufficient documentation

## 2010-10-28 DIAGNOSIS — Z87891 Personal history of nicotine dependence: Secondary | ICD-10-CM | POA: Insufficient documentation

## 2010-10-28 DIAGNOSIS — M549 Dorsalgia, unspecified: Secondary | ICD-10-CM | POA: Insufficient documentation

## 2010-10-28 DIAGNOSIS — M25559 Pain in unspecified hip: Secondary | ICD-10-CM | POA: Insufficient documentation

## 2010-10-28 DIAGNOSIS — Z23 Encounter for immunization: Secondary | ICD-10-CM | POA: Insufficient documentation

## 2010-10-28 DIAGNOSIS — Z79899 Other long term (current) drug therapy: Secondary | ICD-10-CM | POA: Insufficient documentation

## 2010-10-28 LAB — COMPREHENSIVE METABOLIC PANEL
ALT: 14 U/L (ref 0–53)
AST: 14 U/L (ref 0–37)
Albumin: 3.7 g/dL (ref 3.5–5.2)
Alkaline Phosphatase: 121 U/L — ABNORMAL HIGH (ref 39–117)
BUN: 14 mg/dL (ref 6–23)
CO2: 22 mEq/L (ref 19–32)
Calcium: 9.2 mg/dL (ref 8.4–10.5)
Chloride: 103 mEq/L (ref 96–112)
Creatinine, Ser: 1.05 mg/dL (ref 0.50–1.35)
GFR calc Af Amer: >60 mL/min (ref >60)
GFR calc non Af Amer: >60 mL/min (ref >60)
Glucose, Bld: 105 mg/dL — ABNORMAL HIGH (ref 70–99)
Potassium: 3.5 mEq/L (ref 3.5–5.1)
Sodium: 138 mEq/L (ref 135–145)
Total Bilirubin: 0.6 mg/dL (ref 0.3–1.2)
Total Protein: 7.9 g/dL (ref 6.0–8.3)

## 2010-10-28 LAB — CK TOTAL AND CKMB (NOT AT ARMC)
CK, MB: 2.5 ng/mL (ref 0.3–4.0)
Relative Index: 2.4 (ref 0.0–2.5)
Total CK: 104 U/L (ref 7–232)

## 2010-10-28 LAB — DIFFERENTIAL
Basophils Absolute: 0 10*3/uL (ref 0.0–0.1)
Basophils Relative: 0 % (ref 0–1)
Eosinophils Absolute: 0.1 10*3/uL (ref 0.0–0.7)
Eosinophils Relative: 1 % (ref 0–5)
Lymphocytes Relative: 11 % — ABNORMAL LOW (ref 12–46)
Lymphs Abs: 2.7 10*3/uL (ref 0.7–4.0)
Monocytes Absolute: 2 10*3/uL — ABNORMAL HIGH (ref 0.1–1.0)
Monocytes Relative: 8 % (ref 3–12)
Neutro Abs: 19.8 10*3/uL — ABNORMAL HIGH (ref 1.7–7.7)
Neutrophils Relative %: 80 % — ABNORMAL HIGH (ref 43–77)

## 2010-10-28 LAB — CBC
HCT: 43.5 % (ref 39.0–52.0)
Hemoglobin: 15.2 g/dL (ref 13.0–17.0)
MCH: 30.5 pg (ref 26.0–34.0)
MCHC: 34.9 g/dL (ref 30.0–36.0)
MCV: 87.3 fL (ref 78.0–100.0)
Platelets: 293 10*3/uL (ref 150–400)
RBC: 4.98 MIL/uL (ref 4.22–5.81)
RDW: 14.8 % (ref 11.5–15.5)
WBC: 24.7 10*3/uL — ABNORMAL HIGH (ref 4.0–10.5)

## 2010-10-28 LAB — TROPONIN I: Troponin I: <0.3 ng/mL (ref <0.30)

## 2010-10-29 ENCOUNTER — Encounter (HOSPITAL_COMMUNITY): Payer: Self-pay

## 2010-10-29 ENCOUNTER — Observation Stay (HOSPITAL_COMMUNITY): Payer: Medicare Other

## 2010-10-29 LAB — URINALYSIS, ROUTINE W REFLEX MICROSCOPIC
Glucose, UA: NEGATIVE mg/dL
Hgb urine dipstick: NEGATIVE
Ketones, ur: 15 mg/dL — AB
Leukocytes, UA: NEGATIVE
Nitrite: NEGATIVE
Protein, ur: NEGATIVE mg/dL
Specific Gravity, Urine: 1.028 (ref 1.005–1.030)
Urobilinogen, UA: 1 mg/dL (ref 0.0–1.0)
pH: 5 (ref 5.0–8.0)

## 2010-10-29 LAB — LIPID PANEL
Cholesterol: 135 mg/dL (ref 0–200)
HDL: 37 mg/dL — ABNORMAL LOW (ref >39)
LDL Cholesterol: 72 mg/dL (ref 0–99)
Total CHOL/HDL Ratio: 3.6 RATIO
Triglycerides: 131 mg/dL (ref <150)
VLDL: 26 mg/dL (ref 0–40)

## 2010-10-29 LAB — CBC
HCT: 40.6 % (ref 39.0–52.0)
Hemoglobin: 14.7 g/dL (ref 13.0–17.0)
MCH: 31.9 pg (ref 26.0–34.0)
MCHC: 36.2 g/dL — ABNORMAL HIGH (ref 30.0–36.0)
MCV: 88.1 fL (ref 78.0–100.0)
Platelets: 272 10*3/uL (ref 150–400)
RBC: 4.61 MIL/uL (ref 4.22–5.81)
RDW: 14.7 % (ref 11.5–15.5)
WBC: 20.1 10*3/uL — ABNORMAL HIGH (ref 4.0–10.5)

## 2010-10-29 LAB — CARDIAC PANEL(CRET KIN+CKTOT+MB+TROPI)
CK, MB: 3.1 ng/mL (ref 0.3–4.0)
CK, MB: 3 ng/mL (ref 0.3–4.0)
Relative Index: 2.6 — ABNORMAL HIGH (ref 0.0–2.5)
Relative Index: 2.7 — ABNORMAL HIGH (ref 0.0–2.5)
Total CK: 118 U/L (ref 7–232)
Total CK: 112 U/L (ref 7–232)
Troponin I: <0.3 ng/mL (ref <0.30)
Troponin I: <0.3 ng/mL (ref <0.30)

## 2010-10-29 LAB — D-DIMER, QUANTITATIVE (NOT AT ARMC): D-Dimer, Quant: 0.95 ug/mL-FEU — ABNORMAL HIGH (ref 0.00–0.48)

## 2010-10-29 LAB — SEDIMENTATION RATE: Sed Rate: 23 mm/hr — ABNORMAL HIGH (ref 0–16)

## 2010-10-29 LAB — TSH: TSH: 2.359 u[IU]/mL (ref 0.350–4.500)

## 2010-10-29 MED ORDER — IOHEXOL 300 MG/ML  SOLN
100.0000 mL | Freq: Once | INTRAMUSCULAR | Status: AC | PRN
  Administered 2010-10-29: 100 mL via INTRAVENOUS

## 2010-10-30 LAB — URINE CULTURE
Colony Count: NO GROWTH
Culture  Setup Time: 201206241124
Culture: NO GROWTH

## 2010-10-30 LAB — CBC
HCT: 41.4 % (ref 39.0–52.0)
Hemoglobin: 14.1 g/dL (ref 13.0–17.0)
MCH: 30.4 pg (ref 26.0–34.0)
MCHC: 34.1 g/dL (ref 30.0–36.0)
MCV: 89.2 fL (ref 78.0–100.0)
Platelets: 301 10*3/uL (ref 150–400)
RBC: 4.64 MIL/uL (ref 4.22–5.81)
RDW: 14.7 % (ref 11.5–15.5)
WBC: 14.9 10*3/uL — ABNORMAL HIGH (ref 4.0–10.5)

## 2010-10-30 LAB — BASIC METABOLIC PANEL
BUN: 17 mg/dL (ref 6–23)
CO2: 28 mEq/L (ref 19–32)
Calcium: 8.9 mg/dL (ref 8.4–10.5)
Chloride: 102 mEq/L (ref 96–112)
Creatinine, Ser: 1.2 mg/dL (ref 0.50–1.35)
GFR calc Af Amer: >60 mL/min (ref >60)
GFR calc non Af Amer: >60 mL/min (ref >60)
Glucose, Bld: 94 mg/dL (ref 70–99)
Potassium: 3.9 mEq/L (ref 3.5–5.1)
Sodium: 138 mEq/L (ref 135–145)

## 2010-10-30 LAB — D-DIMER, QUANTITATIVE: D-Dimer, Quant: 1.22 ug/mL-FEU — ABNORMAL HIGH (ref 0.00–0.48)

## 2010-11-02 ENCOUNTER — Emergency Department (HOSPITAL_COMMUNITY): Payer: Medicare Other

## 2010-11-02 ENCOUNTER — Inpatient Hospital Stay (HOSPITAL_COMMUNITY)
Admission: EM | Admit: 2010-11-02 | Discharge: 2010-11-08 | DRG: 075 | Disposition: A | Discharge status: Home Health | Payer: Medicare Other | Attending: Internal Medicine | Admitting: Internal Medicine

## 2010-11-02 DIAGNOSIS — J449 Chronic obstructive pulmonary disease, unspecified: Secondary | ICD-10-CM | POA: Diagnosis present

## 2010-11-02 DIAGNOSIS — I471 Supraventricular tachycardia, unspecified: Secondary | ICD-10-CM | POA: Diagnosis present

## 2010-11-02 DIAGNOSIS — I252 Old myocardial infarction: Secondary | ICD-10-CM

## 2010-11-02 DIAGNOSIS — K5900 Constipation, unspecified: Secondary | ICD-10-CM | POA: Diagnosis present

## 2010-11-02 DIAGNOSIS — J4489 Other specified chronic obstructive pulmonary disease: Secondary | ICD-10-CM | POA: Diagnosis present

## 2010-11-02 DIAGNOSIS — A879 Viral meningitis, unspecified: Principal | ICD-10-CM | POA: Diagnosis present

## 2010-11-02 DIAGNOSIS — E785 Hyperlipidemia, unspecified: Secondary | ICD-10-CM | POA: Diagnosis present

## 2010-11-02 DIAGNOSIS — K047 Periapical abscess without sinus: Secondary | ICD-10-CM | POA: Diagnosis present

## 2010-11-02 DIAGNOSIS — I10 Essential (primary) hypertension: Secondary | ICD-10-CM | POA: Diagnosis present

## 2010-11-02 DIAGNOSIS — F17200 Nicotine dependence, unspecified, uncomplicated: Secondary | ICD-10-CM | POA: Diagnosis present

## 2010-11-02 DIAGNOSIS — G8929 Other chronic pain: Secondary | ICD-10-CM | POA: Diagnosis present

## 2010-11-02 DIAGNOSIS — M549 Dorsalgia, unspecified: Secondary | ICD-10-CM | POA: Diagnosis present

## 2010-11-02 LAB — DIFFERENTIAL
Basophils Absolute: 0 10*3/uL (ref 0.0–0.1)
Basophils Relative: 0 % (ref 0–1)
Eosinophils Absolute: 0 10*3/uL (ref 0.0–0.7)
Eosinophils Relative: 0 % (ref 0–5)
Lymphocytes Relative: 13 % (ref 12–46)
Lymphs Abs: 2.8 10*3/uL (ref 0.7–4.0)
Monocytes Absolute: 1.7 10*3/uL — ABNORMAL HIGH (ref 0.1–1.0)
Monocytes Relative: 8 % (ref 3–12)
Neutro Abs: 17.3 10*3/uL — ABNORMAL HIGH (ref 1.7–7.7)
Neutrophils Relative %: 79 % — ABNORMAL HIGH (ref 43–77)

## 2010-11-02 LAB — BASIC METABOLIC PANEL
BUN: 14 mg/dL (ref 6–23)
CO2: 27 mEq/L (ref 19–32)
Calcium: 11 mg/dL — ABNORMAL HIGH (ref 8.4–10.5)
Chloride: 92 mEq/L — ABNORMAL LOW (ref 96–112)
Creatinine, Ser: 1.22 mg/dL (ref 0.50–1.35)
GFR calc Af Amer: >60 mL/min (ref >60)
GFR calc non Af Amer: 59 mL/min — ABNORMAL LOW (ref >60)
Glucose, Bld: 118 mg/dL — ABNORMAL HIGH (ref 70–99)
Potassium: 3.7 mEq/L (ref 3.5–5.1)
Sodium: 133 mEq/L — ABNORMAL LOW (ref 135–145)

## 2010-11-02 LAB — CBC
HCT: 47.9 % (ref 39.0–52.0)
Hemoglobin: 16.8 g/dL (ref 13.0–17.0)
MCH: 30.7 pg (ref 26.0–34.0)
MCHC: 35.1 g/dL (ref 30.0–36.0)
MCV: 87.4 fL (ref 78.0–100.0)
Platelets: 328 10*3/uL (ref 150–400)
RBC: 5.48 MIL/uL (ref 4.22–5.81)
RDW: 14.1 % (ref 11.5–15.5)
WBC: 21.8 10*3/uL — ABNORMAL HIGH (ref 4.0–10.5)

## 2010-11-02 LAB — LACTIC ACID, PLASMA: Lactic Acid, Venous: 1.2 mmol/L (ref 0.5–2.2)

## 2010-11-02 LAB — PROCALCITONIN: Procalcitonin: <0.1 ng/mL

## 2010-11-02 MED ORDER — IOHEXOL 300 MG/ML  SOLN: 100.0000 mL | Freq: Once | INTRAMUSCULAR | Status: AC | PRN

## 2010-11-03 ENCOUNTER — Inpatient Hospital Stay (HOSPITAL_COMMUNITY): Payer: Medicare Other

## 2010-11-03 LAB — COMPREHENSIVE METABOLIC PANEL
ALT: 37 U/L (ref 0–53)
AST: 23 U/L (ref 0–37)
Albumin: 3.6 g/dL (ref 3.5–5.2)
Alkaline Phosphatase: 125 U/L — ABNORMAL HIGH (ref 39–117)
BUN: 11 mg/dL (ref 6–23)
CO2: 27 mEq/L (ref 19–32)
Calcium: 9.6 mg/dL (ref 8.4–10.5)
Chloride: 97 mEq/L (ref 96–112)
Creatinine, Ser: 1.2 mg/dL (ref 0.50–1.35)
GFR calc Af Amer: >60 mL/min (ref >60)
GFR calc non Af Amer: >60 mL/min (ref >60)
Glucose, Bld: 126 mg/dL — ABNORMAL HIGH (ref 70–99)
Potassium: 3.9 mEq/L (ref 3.5–5.1)
Sodium: 132 mEq/L — ABNORMAL LOW (ref 135–145)
Total Bilirubin: 0.4 mg/dL (ref 0.3–1.2)
Total Protein: 7.7 g/dL (ref 6.0–8.3)

## 2010-11-03 LAB — CBC
HCT: 41.4 % (ref 39.0–52.0)
Hemoglobin: 14 g/dL (ref 13.0–17.0)
MCH: 29.5 pg (ref 26.0–34.0)
MCHC: 33.8 g/dL (ref 30.0–36.0)
MCV: 87.3 fL (ref 78.0–100.0)
Platelets: 348 10*3/uL (ref 150–400)
RBC: 4.74 MIL/uL (ref 4.22–5.81)
RDW: 14.1 % (ref 11.5–15.5)
WBC: 20.3 10*3/uL — ABNORMAL HIGH (ref 4.0–10.5)

## 2010-11-03 LAB — CSF CELL COUNT WITH DIFFERENTIAL
RBC Count, CSF: 1 /mm3 — ABNORMAL HIGH
Tube #: 3
WBC, CSF: 6 /mm3 — ABNORMAL HIGH (ref 0–5)

## 2010-11-03 LAB — PROTEIN AND GLUCOSE, CSF
Glucose, CSF: 63 mg/dL (ref 43–76)
Total  Protein, CSF: 226 mg/dL — ABNORMAL HIGH (ref 15–45)

## 2010-11-04 ENCOUNTER — Inpatient Hospital Stay (HOSPITAL_COMMUNITY): Payer: Medicare Other

## 2010-11-04 LAB — CBC
HCT: 40.8 % (ref 39.0–52.0)
Hemoglobin: 13.7 g/dL (ref 13.0–17.0)
MCH: 29.5 pg (ref 26.0–34.0)
MCHC: 33.6 g/dL (ref 30.0–36.0)
MCV: 87.7 fL (ref 78.0–100.0)
Platelets: 318 10*3/uL (ref 150–400)
RBC: 4.65 MIL/uL (ref 4.22–5.81)
RDW: 14.2 % (ref 11.5–15.5)
WBC: 15.6 10*3/uL — ABNORMAL HIGH (ref 4.0–10.5)

## 2010-11-04 LAB — BASIC METABOLIC PANEL
BUN: 10 mg/dL (ref 6–23)
CO2: 25 mEq/L (ref 19–32)
Calcium: 8.9 mg/dL (ref 8.4–10.5)
Chloride: 98 mEq/L (ref 96–112)
Creatinine, Ser: 1.09 mg/dL (ref 0.50–1.35)
GFR calc Af Amer: >60 mL/min (ref >60)
GFR calc non Af Amer: >60 mL/min (ref >60)
Glucose, Bld: 114 mg/dL — ABNORMAL HIGH (ref 70–99)
Potassium: 3.8 mEq/L (ref 3.5–5.1)
Sodium: 133 mEq/L — ABNORMAL LOW (ref 135–145)

## 2010-11-04 LAB — LIPID PANEL
Cholesterol: 94 mg/dL (ref 0–200)
HDL: 30 mg/dL — ABNORMAL LOW (ref >39)
LDL Cholesterol: 48 mg/dL (ref 0–99)
Total CHOL/HDL Ratio: 3.1 RATIO
Triglycerides: 78 mg/dL (ref <150)
VLDL: 16 mg/dL (ref 0–40)

## 2010-11-04 LAB — HEMOGLOBIN A1C
Hgb A1c MFr Bld: 6 % — ABNORMAL HIGH (ref <5.7)
Mean Plasma Glucose: 126 mg/dL — ABNORMAL HIGH (ref <117)

## 2010-11-04 MED ORDER — GADOBENATE DIMEGLUMINE 529 MG/ML IV SOLN
20.0000 mL | Freq: Once | INTRAVENOUS | Status: AC | PRN
  Administered 2010-11-04: 20 mL via INTRAVENOUS

## 2010-11-05 LAB — COMPREHENSIVE METABOLIC PANEL
ALT: 21 U/L (ref 0–53)
AST: 14 U/L (ref 0–37)
Albumin: 3.3 g/dL — ABNORMAL LOW (ref 3.5–5.2)
Alkaline Phosphatase: 109 U/L (ref 39–117)
BUN: 11 mg/dL (ref 6–23)
CO2: 28 mEq/L (ref 19–32)
Calcium: 9.8 mg/dL (ref 8.4–10.5)
Chloride: 93 mEq/L — ABNORMAL LOW (ref 96–112)
Creatinine, Ser: 1.07 mg/dL (ref 0.50–1.35)
GFR calc Af Amer: >60 mL/min (ref >60)
GFR calc non Af Amer: >60 mL/min (ref >60)
Glucose, Bld: 102 mg/dL — ABNORMAL HIGH (ref 70–99)
Potassium: 3.7 mEq/L (ref 3.5–5.1)
Sodium: 131 mEq/L — ABNORMAL LOW (ref 135–145)
Total Bilirubin: 0.4 mg/dL (ref 0.3–1.2)
Total Protein: 8.2 g/dL (ref 6.0–8.3)

## 2010-11-05 LAB — CBC
HCT: 40.9 % (ref 39.0–52.0)
Hemoglobin: 14.1 g/dL (ref 13.0–17.0)
MCH: 30.2 pg (ref 26.0–34.0)
MCHC: 34.5 g/dL (ref 30.0–36.0)
MCV: 87.6 fL (ref 78.0–100.0)
Platelets: 326 10*3/uL (ref 150–400)
RBC: 4.67 MIL/uL (ref 4.22–5.81)
RDW: 14.3 % (ref 11.5–15.5)
WBC: 13.2 10*3/uL — ABNORMAL HIGH (ref 4.0–10.5)

## 2010-11-05 NOTE — H&P (Signed)
Adrian Miller, Adrian Miller NO.:  [id number]  MEDICAL RECORD NO.:  [id number]  LOCATION:  [id number]                         FACILITY:  [facility]  PHYSICIAN:  Lonia Blood, M.D.      DATE OF BIRTH:  1940-10-22  DATE OF ADMISSION:  11/02/2010 DATE OF DISCHARGE:                             HISTORY & PHYSICAL   PRIMARY CARE PHYSICIAN:  Dr. Oliver Barre.  PRESENTING COMPLAINT:  Fever and weakness.  HISTORY OF PRESENT ILLNESS:  The patient is a 70 year old gentleman that was just discharged from [facility] recently after admission with atypical chest pain.  He left the hospital on October 30, 2010.  During that hospitalization, the patient was having some fever and his x-rays suggested early pneumonia.  He was discharged on Avelox to complete for 5 days.  The patient returned secondary to continued pain all over and further more he was having pain in his jaw area and neck.  He is unable to turn his neck to the right side effectively.  His workup here showed that the patient has right upper molar abscess.  He is also having some low-grade fever, hence he is being admitted for further management.  PAST MEDICAL HISTORY:  Significant for, 1. Recent atypical chest pain. 2. History of chronic obstructive pulmonary disease. 3. Cardiomegaly. 4. Hypertension. 5. Hyperlipidemia. 6. Chronic back pain. 7. Chronic tobacco use. 8. Non-Q-wave MI in 1999.  ALLERGIES:  He has no known drug allergies.  CURRENT MEDICATIONS: 1. Avelox 400 mg daily. 2. Hydrochlorothiazide 25 mg daily. 3. Ventolin inhaler 1 puff t.i.d. 4. Robaxin 750 mg p.o. t.i.d. 5. Tramadol 50 mg p.o. t.i.d. 6. Percocet 5/325 one tablet q.6 h. p.r.n. 7. Nicoderm patch as needed.  SOCIAL HISTORY:  The patient lives in [place].  He smokes about half a pack per day.  Denied any alcohol or IV drug use.  FAMILY HISTORY:  Significant for heart disease and hypertension.  REVIEW OF SYSTEMS:  All systems reviewed are  negative except as per HPI.  PHYSICAL EXAMINATION:  VITAL SIGNS:  His temperature is 99, blood pressure 155/86, pulse 85, respiratory rate 16.  He saturations 100% on room air. GENERAL:  He is awake, alert, and oriented.  He is in no acute distress. HEENT:  PERRL.  EOMI.  He has slightly swollen right side of his jaw, which is tender to touch.  He has submandibular lymphadenopathy, otherwise no rhinorrhea.  Examination of his buccal mucosa showed dislodged, several matted poor dentition with missing teeth. NECK:  Supple with some scattered lymphadenopathy. RESPIRATORY:  Good air entry bilaterally.  No wheezes.  No rales. CARDIOVASCULAR:  He has S1 and S2.  No murmur. ABDOMEN:  Soft, full, nontender with positive bowel sounds. EXTREMITIES:  No edema, cyanosis, or clubbing. SKIN:  No rashes or ulcers.  LABORATORY DATA:  Sodium is 133, potassium 3.7, chloride 92, CO2 of 27, glucose 118, BUN 14, creatinine 1.22 with a calcium of 11.0.  White count is 1800, hemoglobin 16.8 with a platelet count of 328.  He has left shift ANC of 17.3.  Procalcitonin is less than 0.1 and lactic acid 1.2.  The patient had a CT of  the head and neck region that showed scattered subcentimeter limb loss in the neck likely reactive.  No abscess or mass identified.  There is evidence of dental infection around the right upper molar.  ASSESSMENT:  This is a 70 year old gentleman presenting with what appears to be a right molar tooth infection with possible abscess. Also, the patient has chronic obstructive pulmonary disease and persistent fever from that.  The oral surgeon has already been consulted and they wanted the patient to be in the hospital for IV antibiotics for now.  PLAN: 1. Right upper molar abscess due to persistent fever and pain.  We     will admit the patient for initial IV antibiotics.  We will start     him on clindamycin and Flagyl per oral surgeon.  They will see him     in the morning and  make further decisions, probably extraction of     that molar tooth.  According to the patient, he had crane injury     years ago at work where he lost most of his teeth, so his current     teeth were actually artificial.  He is therefore very familiar with     his dentist and the procedures by the dental surgeons.  We will     defer, therefore, to them while we do pain control and antibiotics. 2. Chronic obstructive pulmonary disease.  I will put him on empiric     nebulizers. 3. Tobacco abuse.  The patient has been counseled and will be given     nicotine patch as needed. 4. Hypertension and blood pressure seems slightly elevated.  We will     continue hydrochlorothiazide and make further adjustments as     needed. 5. Hyperlipidemia.  He is not on any statin at this point, but seems     stable. 6. Chronic back pain.  We will continue with Robaxin and his Percocet. 7. Dehydration.  Hydrate him gently.  Further treatment will depend on how he responds to these measures.     Lonia Blood, M.D.     Verlin Grills  D:  11/03/2010  T:  11/03/2010  Job:  [id number]  Electronically Signed by Lonia Blood M.D. on 11/05/2010 12:51:19 AM

## 2010-11-06 LAB — CBC
HCT: 40.2 % (ref 39.0–52.0)
Hemoglobin: 13.8 g/dL (ref 13.0–17.0)
MCH: 29.7 pg (ref 26.0–34.0)
MCHC: 34.3 g/dL (ref 30.0–36.0)
MCV: 86.6 fL (ref 78.0–100.0)
Platelets: 351 10*3/uL (ref 150–400)
RBC: 4.64 MIL/uL (ref 4.22–5.81)
RDW: 14.1 % (ref 11.5–15.5)
WBC: 12.9 10*3/uL — ABNORMAL HIGH (ref 4.0–10.5)

## 2010-11-06 LAB — BASIC METABOLIC PANEL
BUN: 12 mg/dL (ref 6–23)
CO2: 22 mEq/L (ref 19–32)
Calcium: 9.3 mg/dL (ref 8.4–10.5)
Chloride: 96 mEq/L (ref 96–112)
Creatinine, Ser: 1.01 mg/dL (ref 0.50–1.35)
GFR calc Af Amer: >60 mL/min (ref >60)
GFR calc non Af Amer: >60 mL/min (ref >60)
Glucose, Bld: 102 mg/dL — ABNORMAL HIGH (ref 70–99)
Potassium: 3.7 mEq/L (ref 3.5–5.1)
Sodium: 131 mEq/L — ABNORMAL LOW (ref 135–145)

## 2010-11-06 LAB — VANCOMYCIN, TROUGH: Vancomycin Tr: 16.3 ug/mL (ref 10.0–20.0)

## 2010-11-06 LAB — C-REACTIVE PROTEIN: CRP: 9.4 mg/dL (ref >0.6)

## 2010-11-07 LAB — CSF CULTURE W GRAM STAIN: Culture: NO GROWTH

## 2010-11-07 LAB — CBC
HCT: 40.6 % (ref 39.0–52.0)
Hemoglobin: 13.9 g/dL (ref 13.0–17.0)
MCH: 29.8 pg (ref 26.0–34.0)
MCHC: 34.2 g/dL (ref 30.0–36.0)
MCV: 86.9 fL (ref 78.0–100.0)
Platelets: 370 10*3/uL (ref 150–400)
RBC: 4.67 MIL/uL (ref 4.22–5.81)
RDW: 14.3 % (ref 11.5–15.5)
WBC: 12.3 10*3/uL — ABNORMAL HIGH (ref 4.0–10.5)

## 2010-11-07 NOTE — Discharge Summary (Signed)
Adrian Miller, Adrian Miller NO.:  [id number]  MEDICAL RECORD NO.:  [id number]  LOCATION:  1441                         FACILITY:  [facility]  PHYSICIAN:  Pleas Koch, MD        DATE OF BIRTH:  [birth year]  DATE OF ADMISSION:  11/02/2010 DATE OF DISCHARGE:                              DISCHARGE SUMMARY   DISCHARGE DIAGNOSES: 1. Likely aseptic/partially treated bacterial meningitis. 2. Right upper molar abscess. 3. Chronic obstructive pulmonary disease. 4. Tobacco abuse. 5. Hypertension. 6. Hyperlipidemia. 7. Chronic back pain. 8. Nonsustained paroxysmal supraventricular tachycardia, 1 episode     while in the hospital.  DISCHARGE MEDICATIONS:  As follows: 1. Lactulose 30 mL b.i.d. 2. Tylenol 650 q.6 p.r.n. 3. Nicotine patch 21 mg over 24 hours. 4. MiraLax 17 g daily. 5. Senokot-S 1 tablet b.i.d. 6. Robaxin 750 one tablet t.i.d. 7. Ceftriaxone 2 g b.i.d. for diagnosis of meningitis for 5 more days.     I have discontinued his Percocet in favor of morphine sulfate 15 g     SR tablets q.12 hourly.  Prescription sufficient for 5 days, 10     tablets given. 8. Tramadol 50 mg 1 tablet t.i.d. p.r.n. 9. Vancomycin 1250 mg b.i.d. IV, sufficient for 5 days. 10.Discontinue BuSpar in favor of Aspirin 81 mg daily.  Discontinue     Avelox, which he was on in the past. 11.Ventolin inhaler 1 puff t.i.d. 12.HCTZ 25 mg once daily. 13.Visine 2 drops monthly p.r.n.[name]  DIAGNOSTIC DATA:  Pertinent imaging studies: 1. CT neck on November 02, 2010, showed infection at the molar site,     scattered subcentimeter lymph nodes, likely reactive.  No abscess     or neck mass.  Orthopantogram on November 03, 2010, showed lucency     around roots of posterior right upper tooth, raises question of     periapical abscess.  CT head without contrast showed unremarkable     noncontrast head CT.  No acute intracranial findings.  Lumbar     puncture was performed. 2. MRA/MRI showed no acute infarct.  No  intracerebral hemorrhage.     a.     Mild-to-moderate small-vessel type changes.     b.     Mild global atrophy of the hydrocephalous.     c.     Very mild paranasal sinus mucosal thickening.  MRA neck showed vertebral arteries ectatic with narrowing measuring 60% to 70%.  No evidence of hemodynamically significant stenosis involving either carotid bifurcation.  Please see full dictation by Dr. Mikeal Hawthorne.  Briefly, this is a 70 year old male recently discharged from [facility] with the diagnosis of atypical chest pain,  fever and was discharged on Avelox.  He returned with pain all over and is having further pain in jaw and neck, unable to turn his neck and he had a right upper molar abscess.  STUDIES:  Vital signs on admission were temperature 99, blood pressure was 165/86, pulse 85, respirations 16, saturating 100% on room air. Initially, sodium was 133, potassium 3.7, BUN and creatinine 14 and 1.22.  Prolactin less than 1, 0.1.  some Left shift, absolute neutrophil count 17.3, white count was  1800.  Please see full dictation by Dr. Mikeal Hawthorne for details.  1. Neck pain and fever.  It was thought that the patient initially had     tooth abscess however, The patient had significant enough pain that     lumbar puncture was performed.  Lumbar puncture revealed     pleocytosis with 6 white cells and CSF preliminary showed no     organism in 2 days.  Cell count differential showed 6 white cells,     1 red blood cell.  Blood cultures were negative.  Protein was 226,     glucose was 63.  I had consulted Dr. Maurice March regarding presentation     and he recommends that this might be a partially treated meningitis     with prominent meningeal focus, which we ruled out.  He does have     ectatic vertebral arteries, but I think this is not a cause of his     headache.  Nevertheless, he has a PICC line and will continue 5     more days of IV and vancomycin and ceftriaxone at home and I will     arrange  home health for the same.  The patient was doing better on     day of discharge and had decreased neck pain. 2. Headache.  This could be a prior meningeal abscess.  We will     continue high-dose medications.  In addition, I have placed him on     morphine. 3. Constipation.  The patient had significant constipation and has     been placed on lactulose, MiraLax, Senokot-S and if he does not     resolve from this, I will add Fleet's enema b.i.d. for 3 days to     help with him passing stool. 4. A 16-beat run of V-tach.  The patient is asymptomatic.  Vitals are     stable.  He had no chest pain at that time.  He has had a recent     workup of cardiac issues inclusive of echocardiogram on October 30, 2010, which showed EF of 55% to 60% and grade 1 diastolic     dysfunction.  He had no further recurrence on day of discharge and     is doing fair. 5. It is noted that he had prior STEMI in 1999 and he is on Goody     Powder at home, I have changed him over to aspirin. 6. Hyperlipidemia.  His lipids are at goal.  With his HDL slightly     depressed, the patient will benefit from exercise and diet     counseling.  DISCHARGE CONDITION:  The patient was stable on day of discharge. Temperature was 99.3, blood pressures were 132-148 over 74-86, pulse was 80, saturating 94% on room air.  He was feeling much better as his white count was down to 12.3 and as his other issues have been resolved, I feel it would be relatively safe to send him home.  I have discussed this with his daughter.  She understands the same.  He will go home with PT/OT and Home Health and likely would benefit from iron to help with medications.          ______________________________ Pleas Koch, MD     JS/MEDQ  D:  11/07/2010  T:  11/07/2010  Job:  [id number]  cc:   Corwin Levins, MD [address] [place] [address]  Electronically Signed  by Pleas Koch MD on 11/07/2010 08:06:09 PM

## 2010-11-09 ENCOUNTER — Telehealth: Payer: Self-pay | Admitting: *Deleted

## 2010-11-09 LAB — CULTURE, BLOOD (ROUTINE X 2)
Culture  Setup Time: 201206290126
Culture  Setup Time: 201206290126
Culture: NO GROWTH
Culture: NO GROWTH

## 2010-11-09 NOTE — Telephone Encounter (Signed)
Caller is requesting call back at your earliest convenience to verify that you will be signing Pt's orders on referral for Home Health and that you will be the signing physician for Pt. [NP 11/14/2010] Caller states that she has a few questions also that she needs to ask of you.

## 2010-11-09 NOTE — Telephone Encounter (Signed)
He is to be Establishing Care as New Patient with you as of 11/14/2010.

## 2010-11-09 NOTE — Telephone Encounter (Signed)
[facility] RN advised that MD cannot sign orders for pt until he has established care.

## 2010-11-09 NOTE — Discharge Summary (Signed)
NAME:  Adrian Miller, Adrian Miller NO.:  [id number]  MEDICAL RECORD NO.:  [id number]  LOCATION:  2037                         FACILITY:  [facility]  PHYSICIAN:  Thad Ranger, MD       DATE OF BIRTH:  17-Aug-1940  DATE OF ADMISSION:  10/28/2010 DATE OF DISCHARGE:  10/30/2010                        DISCHARGE SUMMARY - REFERRING   PRIMARY CARE PHYSICIAN:  Corwin Levins, MD  ORTHOPEDIC DOCTOR:  Lubertha Basque. Jerl Santos, MD  DISCHARGE DIAGNOSES: 1. Atypical chest pain resolved. 2. Emphysema with possible early pneumonia. 3. Cardiomegaly. 4. Hypertension. 5. Hyperlipidemia. 6. History of tobacco abuse. 7. Chronic back pain and hip pain.  DISCHARGE MEDICATIONS: 1. Moxifloxacin 400 mg p.o. daily for 5 days. 2. NicoDerm patch 21 mg/24 hours transdermal daily. 3. Percocet 5/325 mg, 1 tablet p.o. q.6 h. p.r.n. for pain. 4. Tramadol 50 mg p.o. t.i.d. 5. Robaxin 750 mg p.o. t.i.d. 6. Hydrochlorothiazide 25 mg p.o. daily. 7. Ventolin inhaler 1 puff inhaled t.i.d. p.r.n. for shortness of     breath or wheezing.  RADIOLOGICAL DATA:   Chest x-ray June 23, minimal chronic bronchitic changes, no acute abnormality.    CT angiogram of the chest June 24, no evidence of acute PE, emphysema but no acute pulmonary process, cardiomegaly, no acute abnormality identified.    A 2-D echocardiogram on June 25 showed EF in the range of 55-60%, grade 1 diastolic dysfunction, aortic sclerosis.  BRIEF HOSPITALIZATION COURSE:  Mr. Hagood is a 70 year old male who presented on October 28, 2010 for atypical chest pain.  The patient was admitted by Dr. Della Goo.  Please see admit note on the chart. Briefly, Mr. Gleed is a 70 year old male who presented with atypical chest pain for 1 week, also had fevers, chills with weakness and decreased p.o. intake and appetite.  The patient was admitted with atypical chest pain and bronchitis with early pneumonia. 1. Atypical chest pain resolved.  Cardiac enzymes.   Three sets     remained negative for acute ACS.  The patient underwent 2-D     echocardiogram which showed EF of 55-60% with no regional wall     motion abnormalities.  The patient does have grade 1 diastolic     dysfunction.  D-dimer was found to be elevated at 0.95 from the CT     angiogram of the chest which was negative for any acute PE.  It did     show emphysema with cardiomegaly.  The patient also had some     musculoskeletal component to the chest pain as well on examination     and worse with movement.  He was placed on Robaxin and Ultram     scheduled with p.r.n. Percocet.  At the time of discharge, the pain     has resolved. 2. Leukocytosis.  The patient did present with leukocytosis of 24.7.     He was started on Avelox for early pneumonia/bronchitis.  At the     time of dictation, the patient has not spiked any fevers and the     CBC at the time of discharge showed white count improved to 14.9.     The patient will continue Avelox  to complete the course as well as     Ventolin inhaler.  I strongly counseled him for the smoking     cessation and provided him with the NicoDerm patch at the     discharge. 3. Hypertension remained stable.  The patient was continued on HCTZ.     The patient was discharged to home today.  PT, OT evaluation was     done and recommended home health PT, which was arranged through the     case management.  PHYSICAL EXAMINATION ON DISCHARGE:  VITAL SIGNS:  Temperature 98.0, pulse 61, respirations 18, blood pressure 138/88, O2 sats 96% on room air. GENERAL:  The patient is alert, awake, and oriented x3 not in acute distress. HEENT:  Anicteric sclerae, conjunctivae.  Pupils reactive to light and accommodation.  EOMI. NECK:  Supple.  No lymphadenopathy.  No JVD. CVS:  S1, S2 clear. CHEST:  Decreased breath sounds at the bases, otherwise fairly clear. No wheezing or rhonchi noted. ABDOMEN:  Soft, nontender, nondistended.  Normal bowel  sounds. EXTREMITIES:  No cyanosis, clubbing, or edema noted in upper or lower extremities bilaterally.  DISCHARGE DIET:  Heart-healthy diet.  DISCHARGE FOLLOWUP:  With Dr. Oliver Barre in 2 weeks and Dr. Evette Doffing in 2 weeks.     Thad Ranger, MD     RR/MEDQ  D:  10/30/2010  T:  10/30/2010  Job:  [id number]  cc:   Lubertha Basque. Jerl Santos, M.D. Corwin Levins, MD  Electronically Signed by Andres Labrum RAI  on 11/09/2010 01:59:45 PM

## 2010-11-09 NOTE — Telephone Encounter (Signed)
No, since I see no record of involvment or seeing this pt in centricity or epic, and I dont think he is an established pt

## 2010-11-10 LAB — CULTURE, BLOOD (ROUTINE X 2)
Culture  Setup Time: 201206300005
Culture  Setup Time: 201206300005
Culture: NO GROWTH
Culture: NO GROWTH

## 2010-11-14 ENCOUNTER — Ambulatory Visit (INDEPENDENT_AMBULATORY_CARE_PROVIDER_SITE_OTHER): Payer: Medicare Other | Admitting: Internal Medicine

## 2010-11-14 ENCOUNTER — Encounter: Payer: Self-pay | Admitting: Internal Medicine

## 2010-11-14 VITALS — BP 112/74 | HR 71 | Temp 98.3°F | Ht 69.0 in | Wt 201.5 lb

## 2010-11-14 DIAGNOSIS — B370 Candidal stomatitis: Secondary | ICD-10-CM

## 2010-11-14 DIAGNOSIS — Z0000 Encounter for general adult medical examination without abnormal findings: Secondary | ICD-10-CM

## 2010-11-14 DIAGNOSIS — Z23 Encounter for immunization: Secondary | ICD-10-CM

## 2010-11-14 MED ORDER — PNEUMOCOCCAL VAC POLYVALENT 25 MCG/0.5ML IJ INJ
0.5000 mL | INJECTION | Freq: Once | INTRAMUSCULAR | Status: AC
  Administered 2010-11-14: 0.5 mL via INTRAMUSCULAR

## 2010-11-14 MED ORDER — TETANUS-DIPHTH-ACELL PERTUSSIS 5-2.5-18.5 LF-MCG/0.5 IM SUSP
0.5000 mL | Freq: Once | INTRAMUSCULAR | Status: AC
  Administered 2010-11-14: 0.5 mL via INTRAMUSCULAR

## 2010-11-14 MED ORDER — NYSTATIN 100000 UNIT/ML MT SUSP: 500000.0000 [IU] | Freq: Four times a day (QID) | OROMUCOSAL | Status: AC

## 2010-11-14 NOTE — Assessment & Plan Note (Signed)
Ok to d/c the PICC line s/p IV antibx done

## 2010-11-14 NOTE — Progress Notes (Signed)
Subjective:    Patient ID: Adrian Miller, male    DOB: 1940-07-09, 70 y.o.   MRN: [id number]  HPI Here to establish as new pt,  Was just hospd with molar abscess and meningitis, s/p IV antibx tx post hospn with family able to admin IV's.  Now doing well.  Pt denies fever, wt loss, night sweats, loss of appetite, or other constitutional symptoms.  Has gained a few lbs back.  Pt denies chest pain, increased sob or doe, wheezing, orthopnea, PND, increased LE swelling, palpitations, dizziness or syncope.  Pt denies new neurological symptoms such as new headache, or facial or extremity weakness or numbness   Pt denies polydipsia, polyuria. Pt states overall good compliance with meds, to start trying to follow lower cholesterol diet, wt overall stable but little exercise however.   Here for wellness also; Pt denies worsening depressive symptoms, suicidal ideation or panic. No fever, wt loss, night sweats, loss of appetite, or other constitutional symptoms.  Pt states good ability with ADL's, low fall risk, home safety reviewed and adequate, no significant changes in hearing or vision, and occasionally active with exercise.  Home since July 4, daughter able to accomplish IV home meds, and needs PICC liine out. No other new complaints, Using little of the pain medication in the past 2 days. Has some neck soreness since d/c. Past Medical History  Diagnosis Date  . Chronic headaches   . Arthritis   . Lumbar disc disease 11/14/2010  . Hypertension   . Meningitis 11/14/2010  . COPD (chronic obstructive pulmonary disease) 11/14/2010  . Hyperlipidemia 11/14/2010  . Chronic LBP 11/14/2010   History reviewed. No pertinent past surgical history.  reports that he has quit smoking. He does not have any smokeless tobacco history on file. He reports that he does not drink alcohol or use illicit drugs. family history is not on file. No Known Allergies No current outpatient prescriptions on file prior to visit.   No  current facility-administered medications on file prior to visit.   Review of Systems Review of Systems  Constitutional: Negative for diaphoresis, activity change, appetite change and unexpected weight change.  HENT: Negative for hearing loss, ear pain, facial swelling, mouth sores and neck stiffness.   Eyes: Negative for pain, redness and visual disturbance.  Respiratory: Negative for shortness of breath and wheezing.   Cardiovascular: Negative for chest pain and palpitations.  Gastrointestinal: Negative for diarrhea, blood in stool, abdominal distention and rectal pain.  Genitourinary: Negative for hematuria, flank pain and decreased urine volume.  Musculoskeletal: Negative for myalgias and joint swelling.  Skin: Negative for color change and wound.  Neurological: Negative for syncope and numbness.  Hematological: Negative for adenopathy.  Psychiatric/Behavioral: Negative for hallucinations, self-injury, decreased concentration and agitation.      Objective:   Physical Exam BP 112/74  Pulse 71  Temp(Src) 98.3 F (36.8 C) (Oral)  Ht 5\' 9"  (1.753 m)  Wt 201 lb 8 oz (91.4 kg)  BMI 29.76 kg/m2  SpO2 94% Physical Exam  VS noted Constitutional: Pt is oriented to person, place, and time. Appears well-developed and well-nourished.  Head: Normocephalic and atraumatic.  Right Ear: External ear normal.  Left Ear: External ear normal.  Nose: Nose normal.  Mouth/Throat: Oropharynx is clear and moist. mild thrush noted to dorsal tongue Eyes: Conjunctivae and EOM are normal. Pupils are equal, round, and reactive to light.  Neck: Normal range of motion. Neck supple. No JVD present. No tracheal deviation present.  Cardiovascular: Normal  rate, regular rhythm, normal heart sounds and intact distal pulses.   Pulmonary/Chest: Effort normal and breath sounds normal.  Abdominal: Soft. Bowel sounds are normal. There is no tenderness.  Musculoskeletal: Normal range of motion. Exhibits no edema.    Lymphadenopathy:  Has no cervical adenopathy.  Neurological: Pt is alert and oriented to person, place, and time. Pt has normal reflexes. No cranial nerve deficit.  Skin: Skin is warm and dry. No rash noted. Left PICC line in place Psychiatric:  Has  normal mood and affect. Behavior is normal.         Assessment & Plan:

## 2010-11-14 NOTE — Assessment & Plan Note (Signed)
Mild s/p IV antibx, for nystatin soln asd,  to f/u any worsening symptoms or concerns

## 2010-11-14 NOTE — Patient Instructions (Addendum)
Take all new medications as prescribed - the nystatin soln Continue all other medications as before We will contact advanced home care to have the PICC line stopped You had the pneumonia and tetanus shots today You will be contacted regarding the referral for: colonoscopy Please return in 1 year for your yearly visit, or sooner if needed, with Lab testing done 3-5 days before

## 2010-11-14 NOTE — Assessment & Plan Note (Signed)
Overall doing well, age appropriate education and counseling updated, referrals for preventative services and immunizations addressed, dietary and smoking counseling addressed, most recent labs and ECG reviewed.  I have personally reviewed and have noted: 1) the patient's medical and social history 2) The pt's use of alcohol, tobacco, and illicit drugs 3) The patient's current medications and supplements 4) Functional ability including ADL's, fall risk, home safety risk, hearing and visual impairment 5) Diet and physical activities 6) Evidence for depression or mood disorder 7) The patient's height, weight, and BMI have been recorded in the chart I have made referrals, and provided counseling and education based on review of the above Due for colonsocpy, tetanus and pneumonia

## 2010-11-24 ENCOUNTER — Emergency Department (HOSPITAL_COMMUNITY)
Admission: EM | Admit: 2010-11-24 | Discharge: 2010-11-24 | Disposition: A | Discharge status: Home / Self Care | Payer: Medicare Other | Attending: Emergency Medicine | Admitting: Emergency Medicine

## 2010-11-24 DIAGNOSIS — N390 Urinary tract infection, site not specified: Secondary | ICD-10-CM | POA: Insufficient documentation

## 2010-11-24 DIAGNOSIS — W1809XA Striking against other object with subsequent fall, initial encounter: Secondary | ICD-10-CM | POA: Insufficient documentation

## 2010-11-24 DIAGNOSIS — S63509A Unspecified sprain of unspecified wrist, initial encounter: Secondary | ICD-10-CM | POA: Insufficient documentation

## 2010-11-24 DIAGNOSIS — I10 Essential (primary) hypertension: Secondary | ICD-10-CM | POA: Insufficient documentation

## 2010-11-24 LAB — POCT I-STAT, CHEM 8
BUN: 10 mg/dL (ref 6–23)
Calcium, Ion: 1.06 mmol/L — ABNORMAL LOW (ref 1.12–1.32)
Chloride: 104 mEq/L (ref 96–112)
Creatinine, Ser: 1 mg/dL (ref 0.50–1.35)
Glucose, Bld: 104 mg/dL — ABNORMAL HIGH (ref 70–99)
HCT: 42 % (ref 39.0–52.0)
Hemoglobin: 14.3 g/dL (ref 13.0–17.0)
Potassium: 3.8 mEq/L (ref 3.5–5.1)
Sodium: 139 mEq/L (ref 135–145)
TCO2: 24 mmol/L (ref 0–100)

## 2010-11-24 LAB — URINALYSIS, ROUTINE W REFLEX MICROSCOPIC
Bilirubin Urine: NEGATIVE
Glucose, UA: NEGATIVE mg/dL
Hgb urine dipstick: NEGATIVE
Ketones, ur: NEGATIVE mg/dL
Nitrite: NEGATIVE
Protein, ur: NEGATIVE mg/dL
Specific Gravity, Urine: 1.024 (ref 1.005–1.030)
Urobilinogen, UA: 0.2 mg/dL (ref 0.0–1.0)
pH: 6 (ref 5.0–8.0)

## 2010-11-24 LAB — DIFFERENTIAL
Basophils Absolute: 0 10*3/uL (ref 0.0–0.1)
Basophils Relative: 0 % (ref 0–1)
Eosinophils Absolute: 0.1 10*3/uL (ref 0.0–0.7)
Eosinophils Relative: 1 % (ref 0–5)
Lymphocytes Relative: 13 % (ref 12–46)
Lymphs Abs: 2.1 10*3/uL (ref 0.7–4.0)
Monocytes Absolute: 1.4 10*3/uL — ABNORMAL HIGH (ref 0.1–1.0)
Monocytes Relative: 9 % (ref 3–12)
Neutro Abs: 11.8 10*3/uL — ABNORMAL HIGH (ref 1.7–7.7)
Neutrophils Relative %: 77 % (ref 43–77)

## 2010-11-24 LAB — URINE MICROSCOPIC-ADD ON

## 2010-11-24 LAB — CBC
HCT: 38.9 % — ABNORMAL LOW (ref 39.0–52.0)
Hemoglobin: 13 g/dL (ref 13.0–17.0)
MCH: 29.3 pg (ref 26.0–34.0)
MCHC: 33.4 g/dL (ref 30.0–36.0)
MCV: 87.8 fL (ref 78.0–100.0)
Platelets: 286 10*3/uL (ref 150–400)
RBC: 4.43 MIL/uL (ref 4.22–5.81)
RDW: 14.2 % (ref 11.5–15.5)
WBC: 15.4 10*3/uL — ABNORMAL HIGH (ref 4.0–10.5)

## 2010-11-27 ENCOUNTER — Telehealth: Payer: Self-pay

## 2010-11-27 NOTE — Telephone Encounter (Signed)
[facility] Triage Call Report Triage Record Num: [id number] Operator: Patriciaann Clan Patient Name: Adrian Miller Call Date & Time: 11/26/2010 9:43:25PM Patient Phone: [phone] PCP: Oliver Barre Patient Gender: Male PCP Fax : [phone] Patient DOB: 1940/09/26 Practice Name: [facility] Reason for Call: Daughter/[name] calling. States patient c/o mid sternal chest pain. Onset 11/26/10 0800. States pain intermittent, brief, since 0800 11/26/10. Patient had PICC line removed 11/14/10. Patient has cast on hand for hand fx 11/16/10. States intermittent shortness of breath. Describes pain as an "ache." States pain @ "2" on 1-10 scale. Patient also states he feels irregular heart beats. Triage per Chest Pain Protocol. Care advice given per guidelines. Advised to give ASA 81mg .; 4 tablets now. Daughter advised to call 911 for transport to [facility] ED. Verbalizes understanding and agreeable. Protocol(s) Used: Chest Pain Recommended Outcome per Protocol: Activate EMS 911 Reason for Outcome: New onset or worsening odd heartbeats or different heart rate (fluttering, skipping, or rapid heart beat) with this chest pain Care Advice: ~ Protect the patient from falling or other harm. ~ An adult should stay with the patient, preferably one trained in CPR. ~ IMMEDIATE ACTION Write down provider's name. List or place the following in a bag for transport with the patient: current prescription and/or nonprescription medications; alternative treatments, therapies and medications; and street drugs. ~ After calling EMS 911, have the person chew one aspirin tablet (325 mg), or 4 baby aspirin (81mg ) with a small amount of water now if conscious, not allergic to aspirin, or if has not been told to avoid taking aspirin by their provider. It is important to use aspirin, not acetaminophen. ~ ~ Tell providers if you are taking an erectile dysfunction drug such as Viagra(R), Cialis(R), Levitra(R). 11/26/2010  10:00:02PM Page 1 of 1 [id number]

## 2011-01-15 ENCOUNTER — Encounter: Payer: Self-pay | Admitting: Gastroenterology

## 2011-02-05 LAB — CBC
HCT: 29.5 — ABNORMAL LOW
HCT: 30.7 — ABNORMAL LOW
HCT: 31.5 — ABNORMAL LOW
HCT: 34.4 — ABNORMAL LOW
HCT: 34.8 — ABNORMAL LOW
HCT: 35.1 — ABNORMAL LOW
HCT: 35.6 — ABNORMAL LOW
HCT: 25.3 — ABNORMAL LOW
HCT: 26.7 — ABNORMAL LOW
Hemoglobin: 10.4 — ABNORMAL LOW
Hemoglobin: 10.6 — ABNORMAL LOW
Hemoglobin: 11.4 — ABNORMAL LOW
Hemoglobin: 11.8 — ABNORMAL LOW
Hemoglobin: 12 — ABNORMAL LOW
Hemoglobin: 12.1 — ABNORMAL LOW
Hemoglobin: 9.9 — ABNORMAL LOW
Hemoglobin: 8.4 — ABNORMAL LOW
Hemoglobin: 9.1 — ABNORMAL LOW
MCHC: 33.2
MCHC: 33.5
MCHC: 33.5
MCHC: 33.6
MCHC: 33.7
MCHC: 33.9
MCHC: 34.8
MCHC: 33.4
MCHC: 34.2
MCV: 91.9
MCV: 93.2
MCV: 93.5
MCV: 93.6
MCV: 94
MCV: 94.1
MCV: 94.6
MCV: 93.3
MCV: 93.8
Platelets: 232
Platelets: 232
Platelets: 239
Platelets: 260
Platelets: 300
Platelets: 627 — ABNORMAL HIGH
Platelets: 715 — ABNORMAL HIGH
Platelets: 712 — ABNORMAL HIGH
Platelets: 767 — ABNORMAL HIGH
RBC: 3.14 — ABNORMAL LOW
RBC: 3.3 — ABNORMAL LOW
RBC: 3.35 — ABNORMAL LOW
RBC: 3.64 — ABNORMAL LOW
RBC: 3.76 — ABNORMAL LOW
RBC: 3.79 — ABNORMAL LOW
RBC: 3.8 — ABNORMAL LOW
RBC: 2.69 — ABNORMAL LOW
RBC: 2.86 — ABNORMAL LOW
RDW: 14.1
RDW: 14.7
RDW: 14.7
RDW: 14.8
RDW: 14.8
RDW: 14.8
RDW: 14.9
RDW: 14.4
RDW: 14.6
WBC: 11.5 — ABNORMAL HIGH
WBC: 14.8 — ABNORMAL HIGH
WBC: 15.4 — ABNORMAL HIGH
WBC: 16 — ABNORMAL HIGH
WBC: 17 — ABNORMAL HIGH
WBC: 18.4 — ABNORMAL HIGH
WBC: 19.3 — ABNORMAL HIGH
WBC: 8.2
WBC: 9

## 2011-02-05 LAB — GRAM STAIN

## 2011-02-05 LAB — PROTIME-INR
INR: 1.1
INR: 1.2
INR: 1.4
INR: 1.5
INR: 1.5
INR: 1.9 — ABNORMAL HIGH
INR: 2.3 — ABNORMAL HIGH
INR: 2.4 — ABNORMAL HIGH
INR: 1.4
INR: 1.4
INR: 1.5
INR: 1.7 — ABNORMAL HIGH
INR: 1.7 — ABNORMAL HIGH
INR: 1.8 — ABNORMAL HIGH
Prothrombin Time: 14.4
Prothrombin Time: 15.4 — ABNORMAL HIGH
Prothrombin Time: 18.1 — ABNORMAL HIGH
Prothrombin Time: 18.8 — ABNORMAL HIGH
Prothrombin Time: 19 — ABNORMAL HIGH
Prothrombin Time: 23.1 — ABNORMAL HIGH
Prothrombin Time: 26.5 — ABNORMAL HIGH
Prothrombin Time: 27.3 — ABNORMAL HIGH
Prothrombin Time: 17.4 — ABNORMAL HIGH
Prothrombin Time: 18 — ABNORMAL HIGH
Prothrombin Time: 19.2 — ABNORMAL HIGH
Prothrombin Time: 20.3 — ABNORMAL HIGH
Prothrombin Time: 21.2 — ABNORMAL HIGH
Prothrombin Time: 22.2 — ABNORMAL HIGH

## 2011-02-05 LAB — COMPREHENSIVE METABOLIC PANEL
ALT: 14
AST: 18
Albumin: 2.5 — ABNORMAL LOW
Alkaline Phosphatase: 64
BUN: 8
CO2: 26
Calcium: 8.7
Chloride: 102
Creatinine, Ser: 0.97
GFR calc Af Amer: >60
GFR calc non Af Amer: >60
Glucose, Bld: 104 — ABNORMAL HIGH
Potassium: 3.8
Sodium: 137
Total Bilirubin: 1.1
Total Protein: 6.7

## 2011-02-05 LAB — POCT I-STAT, CHEM 8
BUN: 16
Calcium, Ion: 1.13
Chloride: 105
Creatinine, Ser: 1.3
Glucose, Bld: 147 — ABNORMAL HIGH
HCT: 32 — ABNORMAL LOW
Hemoglobin: 10.9 — ABNORMAL LOW
Potassium: 3.8
Sodium: 138
TCO2: 23

## 2011-02-05 LAB — URINALYSIS, ROUTINE W REFLEX MICROSCOPIC
Bilirubin Urine: NEGATIVE
Glucose, UA: NEGATIVE
Glucose, UA: NEGATIVE
Hgb urine dipstick: NEGATIVE
Hgb urine dipstick: NEGATIVE
Ketones, ur: 15 — AB
Ketones, ur: NEGATIVE
Nitrite: NEGATIVE
Nitrite: NEGATIVE
Protein, ur: NEGATIVE
Protein, ur: NEGATIVE
Specific Gravity, Urine: 1.024
Specific Gravity, Urine: 1.019
Urobilinogen, UA: 2 — ABNORMAL HIGH
Urobilinogen, UA: 2 — ABNORMAL HIGH
pH: 6
pH: 6

## 2011-02-05 LAB — BASIC METABOLIC PANEL
BUN: 10
BUN: 13
BUN: 15
BUN: 22
CO2: 25
CO2: 25
CO2: 26
CO2: 27
Calcium: 8.6
Calcium: 8.6
Calcium: 8.7
Calcium: 8.8
Chloride: 100
Chloride: 103
Chloride: 103
Chloride: 105
Creatinine, Ser: 1.05
Creatinine, Ser: 1.19
Creatinine, Ser: 1.36
Creatinine, Ser: 1.36
GFR calc Af Amer: >60
GFR calc Af Amer: >60
GFR calc Af Amer: >60
GFR calc Af Amer: >60
GFR calc non Af Amer: 52 — ABNORMAL LOW
GFR calc non Af Amer: 52 — ABNORMAL LOW
GFR calc non Af Amer: >60
GFR calc non Af Amer: >60
Glucose, Bld: 101 — ABNORMAL HIGH
Glucose, Bld: 104 — ABNORMAL HIGH
Glucose, Bld: 106 — ABNORMAL HIGH
Glucose, Bld: 127 — ABNORMAL HIGH
Potassium: 3.5
Potassium: 3.9
Potassium: 4
Potassium: 4
Sodium: 136
Sodium: 136
Sodium: 137
Sodium: 137

## 2011-02-05 LAB — LIPID PANEL
Cholesterol: 123
HDL: 23 — ABNORMAL LOW
LDL Cholesterol: 71
Total CHOL/HDL Ratio: 5.3
Triglycerides: 146
VLDL: 29

## 2011-02-05 LAB — SYNOVIAL CELL COUNT + DIFF, W/ CRYSTALS
Crystals, Fluid: NONE SEEN
Crystals, Fluid: NONE SEEN
Crystals, Fluid: NONE SEEN
Eosinophils-Synovial: 0
Lymphocytes-Synovial Fld: 6
Lymphocytes-Synovial Fld: 4
Monocyte-Macrophage-Synovial Fluid: 2 — ABNORMAL LOW
Monocyte-Macrophage-Synovial Fluid: 2 — ABNORMAL LOW
Neutrophil, Synovial: 92 — ABNORMAL HIGH
Neutrophil, Synovial: 100 — ABNORMAL HIGH
Neutrophil, Synovial: 94 — ABNORMAL HIGH
Other Cells-SYN: 0
WBC, Synovial: 15490 — ABNORMAL HIGH
WBC, Synovial: 23500 — ABNORMAL HIGH

## 2011-02-05 LAB — AMYLASE: Amylase: 48

## 2011-02-05 LAB — DIFFERENTIAL
Basophils Absolute: 0.1
Basophils Absolute: 0
Basophils Absolute: 0.1
Basophils Relative: 1
Basophils Relative: 0
Basophils Relative: 1
Eosinophils Absolute: 0
Eosinophils Absolute: 0.2
Eosinophils Absolute: 0.2
Eosinophils Relative: 0
Eosinophils Relative: 2
Eosinophils Relative: 3
Lymphocytes Relative: 9 — ABNORMAL LOW
Lymphocytes Relative: 22
Lymphocytes Relative: 25
Lymphs Abs: 1.5
Lymphs Abs: 2
Lymphs Abs: 2
Monocytes Absolute: 1.2 — ABNORMAL HIGH
Monocytes Absolute: 1.2 — ABNORMAL HIGH
Monocytes Absolute: 1.2 — ABNORMAL HIGH
Monocytes Relative: 7
Monocytes Relative: 13 — ABNORMAL HIGH
Monocytes Relative: 15 — ABNORMAL HIGH
Neutro Abs: 14.2 — ABNORMAL HIGH
Neutro Abs: 4.8
Neutro Abs: 5.6
Neutrophils Relative %: 83 — ABNORMAL HIGH
Neutrophils Relative %: 58
Neutrophils Relative %: 62

## 2011-02-05 LAB — URINE CULTURE
Colony Count: NO GROWTH
Culture: NO GROWTH
Special Requests: NEGATIVE

## 2011-02-05 LAB — CULTURE, BLOOD (SINGLE): Culture: NO GROWTH

## 2011-02-05 LAB — BODY FLUID CULTURE: Culture: NO GROWTH

## 2011-02-05 LAB — CULTURE, BLOOD (ROUTINE X 2)
Culture: NO GROWTH
Culture: NO GROWTH

## 2011-02-05 LAB — VANCOMYCIN, TROUGH: Vancomycin Tr: <5 — ABNORMAL LOW

## 2011-02-05 LAB — URINE MICROSCOPIC-ADD ON

## 2011-02-05 LAB — CK: Total CK: 71

## 2011-02-05 LAB — LIPASE, BLOOD: Lipase: 25

## 2011-02-05 LAB — SEDIMENTATION RATE: Sed Rate: 110 — ABNORMAL HIGH

## 2011-02-07 LAB — BASIC METABOLIC PANEL
BUN: 14
CO2: 25
Calcium: 9
Chloride: 109
Creatinine, Ser: 1.07
GFR calc Af Amer: >60
GFR calc non Af Amer: >60
Glucose, Bld: 82
Potassium: 4.4
Sodium: 138

## 2011-02-07 LAB — CBC
HCT: 43.2
Hemoglobin: 14.4
MCHC: 33.3
MCV: 93.3
Platelets: 263
RBC: 4.63
RDW: 15.1
WBC: 8.1

## 2011-03-13 ENCOUNTER — Encounter: Payer: Self-pay | Admitting: Internal Medicine

## 2011-03-13 ENCOUNTER — Other Ambulatory Visit (INDEPENDENT_AMBULATORY_CARE_PROVIDER_SITE_OTHER): Payer: Medicare Other

## 2011-03-13 ENCOUNTER — Ambulatory Visit (INDEPENDENT_AMBULATORY_CARE_PROVIDER_SITE_OTHER): Payer: Medicare Other | Admitting: Internal Medicine

## 2011-03-13 VITALS — BP 140/82 | HR 84 | Temp 99.0°F | Wt 202.1 lb

## 2011-03-13 DIAGNOSIS — R42 Dizziness and giddiness: Secondary | ICD-10-CM

## 2011-03-13 DIAGNOSIS — IMO0002 Reserved for concepts with insufficient information to code with codable children: Secondary | ICD-10-CM

## 2011-03-13 DIAGNOSIS — G589 Mononeuropathy, unspecified: Secondary | ICD-10-CM

## 2011-03-13 DIAGNOSIS — G5610 Other lesions of median nerve, unspecified upper limb: Secondary | ICD-10-CM

## 2011-03-13 DIAGNOSIS — G90519 Complex regional pain syndrome I of unspecified upper limb: Secondary | ICD-10-CM

## 2011-03-13 LAB — CBC WITH DIFFERENTIAL/PLATELET
Basophils Absolute: 0 10*3/uL (ref 0.0–0.1)
Basophils Relative: 0.2 % (ref 0.0–3.0)
Eosinophils Absolute: 0.2 10*3/uL (ref 0.0–0.7)
Eosinophils Relative: 1.7 % (ref 0.0–5.0)
HCT: 46.7 % (ref 39.0–52.0)
Hemoglobin: 16 g/dL (ref 13.0–17.0)
Lymphocytes Relative: 19.1 % (ref 12.0–46.0)
Lymphs Abs: 2.8 10*3/uL (ref 0.7–4.0)
MCHC: 34.2 g/dL (ref 30.0–36.0)
MCV: 91.9 fl (ref 78.0–100.0)
Monocytes Absolute: 1.1 10*3/uL — ABNORMAL HIGH (ref 0.1–1.0)
Monocytes Relative: 7.2 % (ref 3.0–12.0)
Neutro Abs: 10.5 10*3/uL — ABNORMAL HIGH (ref 1.4–7.7)
Neutrophils Relative %: 71.8 % (ref 43.0–77.0)
Platelets: 314 10*3/uL (ref 150.0–400.0)
RBC: 5.08 Mil/uL (ref 4.22–5.81)
RDW: 15 % — ABNORMAL HIGH (ref 11.5–14.6)
WBC: 14.6 10*3/uL — ABNORMAL HIGH (ref 4.5–10.5)

## 2011-03-13 MED ORDER — MECLIZINE HCL 12.5 MG PO TABS: 12.5000 mg | ORAL_TABLET | Freq: Three times a day (TID) | ORAL | Status: DC | PRN

## 2011-03-13 NOTE — Patient Instructions (Signed)
It was good to see you today. We have reviewed your prior records including labs and tests today Test(s) ordered today. Your results will be called to you after review (48-72hours after test completion). If any changes need to be made, you will be notified at that time. Median nerve injury causing hand numbness and weakness - we'll make referral to hand specialist to help you with same. Our office will contact you regarding appointment(s) once made. Use meclizine as needed for dizzy symptoms - Your prescription(s) have been submitted to your pharmacy. Please take as directed and contact our office if you believe you are having problem(s) with the medication(s). Please schedule followup in 2 weeks with Dr. Jonny Ruiz, call sooner if problems.    Vertigo Vertigo means you feel like you or your surroundings are moving when they are not. Vertigo can be dangerous if it occurs when you are at work, driving, or performing difficult activities.   CAUSES   Vertigo occurs when there is a conflict of signals sent to your brain from the visual and sensory systems in your body. There are many different causes of vertigo, including:  Infections, especially in the inner ear.     A bad reaction to a drug or misuse of alcohol and medicines.     Withdrawal from drugs or alcohol.     Rapidly changing positions, such as lying down or rolling over in bed.     A migraine headache.     Decreased blood flow to the brain.     Increased pressure in the brain from a head injury, infection, tumor, or bleeding.  SYMPTOMS   You may feel as though the world is spinning around or you are falling to the ground. Because your balance is upset, vertigo can cause nausea and vomiting. You may have involuntary eye movements (nystagmus). DIAGNOSIS   Vertigo is usually diagnosed by physical exam. If the cause of your vertigo is unknown, your caregiver may perform imaging tests, such as an MRI scan (magnetic resonance  imaging). TREATMENT   Most cases of vertigo resolve on their own, without treatment. Depending on the cause, your caregiver may prescribe certain medicines. If your vertigo is related to body position issues, your caregiver may recommend movements or procedures to correct the problem. In rare cases, if your vertigo is caused by certain inner ear problems, you may need surgery. HOME CARE INSTRUCTIONS    Follow your caregiver's instructions.     Avoid driving.     Avoid operating heavy machinery.     Avoid performing any tasks that would be dangerous to you or others during a vertigo episode.     Tell your caregiver if you notice that certain medicines seem to be causing your vertigo. Some of the medicines used to treat vertigo episodes can actually make them worse in some people.  SEEK IMMEDIATE MEDICAL CARE IF:    Your medicines do not relieve your vertigo or are making it worse.     You develop problems with talking, walking, weakness, or using your arms, hands, or legs.     You develop severe headaches.     Your nausea or vomiting continues or gets worse.     You develop visual changes.     A family member notices behavioral changes.     Your condition gets worse.  MAKE SURE YOU:  Understand these instructions.     Will watch your condition.     Will get help right away if  you are not doing well or get worse.  Document Released: 01/31/2005 Document Revised: 01/03/2011 Document Reviewed: 11/09/2010 [facility] Patient Information 2012 [place], [place].

## 2011-03-13 NOTE — Progress Notes (Signed)
Subjective:    Patient ID: Adrian Miller, male    DOB: May 15, 1940, 70 y.o.   MRN: [id number]  HPI Complains of numbness in right hand Onset following right forearm trauma in July 2012 Seen at that time by urgent care and subsequent referral to orthopedics>> reports ? of fracture>> recommendation to wear wrist splint until symptoms improved. Persisting pain and numbness especially in, index and third finger. Weakness when using right hand such as holding coffee mug or shaving activity. Now swelling in thumb index and third finger on right hand over past week, increase in numbness and pain No relief with use of splint, temporary relief with previously prescribed Percocet  Complains of dizziness x3 days Symptoms are positional, worse with position change Symptoms resolved when lying still/supine or eyes closed No head trauma or recent falls History of meningitis summer 2012 reviewed No tinnitus or hearing change, no vision change or balance change  Past Medical History  Diagnosis Date  . Chronic headaches   . Arthritis   . Lumbar disc disease   . Hypertension   . Meningitis 10/2010    related to molart abscess   . COPD (chronic obstructive pulmonary disease)   . Hyperlipidemia   . Chronic LBP      Review of Systems  Constitutional: Positive for appetite change. Negative for fever, chills and fatigue.  Respiratory: Negative for cough and shortness of breath.   Cardiovascular: Negative for chest pain and leg swelling.  Musculoskeletal: Negative for myalgias, back pain and gait problem.  Skin: Negative for pallor and rash.  Neurological: Negative for tremors, seizures, syncope and speech difficulty.       Objective:   Physical Exam BP 140/82  Pulse 84  Temp(Src) 99 F (37.2 C) (Oral)  Wt 202 lb 1.9 oz (91.681 kg)  SpO2 98% Wt Readings from Last 3 Encounters:  03/13/11 202 lb 1.9 oz (91.681 kg)  11/14/10 201 lb 8 oz (91.4 kg)   Constitutional:  He appears well-developed  and well-nourished. No distress.  Dtr at side HENT: NCAT, ears bilaterally without cerumen, bilateral tympanic membranes without erythema or effusion. Neck: Normal range of motion. Neck supple. No JVD present. No thyromegaly present.  Cardiovascular: Normal rate, regular rhythm and normal heart sounds.  No murmur heard. no BLE edema Pulmonary/Chest: Effort normal and breath sounds normal. No respiratory distress. no wheezes.   Musculoskeletal: No gross deformities of right forearm, wrist or hand. Diffuse but mild edematous swelling of the palm, thumb, index and third finger on right hand. Weakness of grip on right hand compared to left grip; poor flexion and extension of right hand. Neurological: he is alert and oriented to person, place, and time. No cranial nerve deficit. No nystagmus. Coordination symmetrically intact and gait cautious but normal and unaided.  Psychiatric: he has a normal mood and affect. behavior is normal. Judgment and thought content normal.   Lab Results  Component Value Date   WBC 15.4* 11/24/2010   HGB 14.3 11/24/2010   HCT 42.0 11/24/2010   PLT 286 11/24/2010   GLUCOSE 104* 11/24/2010   CHOL 94 11/04/2010   TRIG 78 11/04/2010   HDL 30* 11/04/2010   LDLCALC 48 11/04/2010   ALT 21 11/05/2010   AST 14 11/05/2010   NA 139 11/24/2010   K 3.8 11/24/2010   CL 104 11/24/2010   CREATININE 1.00 11/24/2010   BUN 10 11/24/2010   CO2 22 11/06/2010   TSH 2.359 10/29/2010   INR 1.8* 02/11/2008  HGBA1C 6.0* 11/04/2010        Assessment & Plan:   R hand progressive numbness, pain with new weakness and swelling of, index and third fingers. Precipitated by forearm trauma 11/2010, ? Fracture. Seen initially in urgent care but lost to or for followup since. Wearing wrist splints without relief of pain symptoms. Clinically concerning for complex regional pain syndrome or developing dystrophy>> refer urgent to hand specialist for eval and tx of same  Vertigo - ENT and neuro exam benign, but  check labs given hx meningitis 10/2010 related to molar abscess - trial antivert - education provided on same

## 2011-03-15 ENCOUNTER — Telehealth: Payer: Self-pay

## 2011-03-15 NOTE — Telephone Encounter (Signed)
Called left message to call back 

## 2011-03-15 NOTE — Telephone Encounter (Signed)
Already addressed per Dr Felicity Coyer and I agree - see other note

## 2011-03-15 NOTE — Telephone Encounter (Signed)
Called the daughter and informed  Of MD's instructions.  The patients daughter would like more explanation please. She would like to know if there is anything she can do until the appointment or symptoms to watch for that would indicate a problem with the WBC. She is very concerned he could be getting worse and would like to know what to do.

## 2011-03-15 NOTE — Telephone Encounter (Signed)
The patients daughter Lanney Gins called with concern over the patients WBC being up on 03/13/2011. She would like to know how they can watch that on a daily basis, there is concern that it will continue to go and without checking daily  How will they know?

## 2011-03-15 NOTE — Telephone Encounter (Signed)
WBC has been mild slight elev for 4 mo, and extremely unlikely to need to be checked daily;  The time to do this would be if there was some concern that he has a known active acute infection that checking the WBC would be helpful in determining if the infection is getting better or worse

## 2011-03-16 NOTE — Telephone Encounter (Signed)
Informed the patients daughter of information. 

## 2011-03-27 ENCOUNTER — Ambulatory Visit: Payer: Medicare Other | Admitting: Internal Medicine

## 2011-03-27 DIAGNOSIS — Z0289 Encounter for other administrative examinations: Secondary | ICD-10-CM

## 2011-04-19 ENCOUNTER — Ambulatory Visit (INDEPENDENT_AMBULATORY_CARE_PROVIDER_SITE_OTHER): Payer: Medicare Other

## 2011-04-19 DIAGNOSIS — M1710 Unilateral primary osteoarthritis, unspecified knee: Secondary | ICD-10-CM

## 2011-04-19 DIAGNOSIS — M25569 Pain in unspecified knee: Secondary | ICD-10-CM

## 2011-04-19 DIAGNOSIS — R350 Frequency of micturition: Secondary | ICD-10-CM

## 2011-07-16 ENCOUNTER — Encounter (HOSPITAL_COMMUNITY): Payer: Self-pay

## 2011-07-16 ENCOUNTER — Emergency Department (HOSPITAL_COMMUNITY)
Admission: EM | Admit: 2011-07-16 | Discharge: 2011-07-16 | Disposition: A | Discharge status: Home / Self Care | Payer: Medicare Other | Attending: Emergency Medicine | Admitting: Emergency Medicine

## 2011-07-16 DIAGNOSIS — K029 Dental caries, unspecified: Secondary | ICD-10-CM | POA: Insufficient documentation

## 2011-07-16 DIAGNOSIS — Z87891 Personal history of nicotine dependence: Secondary | ICD-10-CM | POA: Insufficient documentation

## 2011-07-16 DIAGNOSIS — I10 Essential (primary) hypertension: Secondary | ICD-10-CM | POA: Insufficient documentation

## 2011-07-16 DIAGNOSIS — E785 Hyperlipidemia, unspecified: Secondary | ICD-10-CM | POA: Insufficient documentation

## 2011-07-16 DIAGNOSIS — K0889 Other specified disorders of teeth and supporting structures: Secondary | ICD-10-CM

## 2011-07-16 DIAGNOSIS — J449 Chronic obstructive pulmonary disease, unspecified: Secondary | ICD-10-CM | POA: Insufficient documentation

## 2011-07-16 DIAGNOSIS — J4489 Other specified chronic obstructive pulmonary disease: Secondary | ICD-10-CM | POA: Insufficient documentation

## 2011-07-16 MED ORDER — PENICILLIN V POTASSIUM 500 MG PO TABS: 500.0000 mg | ORAL_TABLET | Freq: Four times a day (QID) | ORAL | Status: AC

## 2011-07-16 MED ORDER — OXYCODONE-ACETAMINOPHEN 5-325 MG PO TABS: 1.0000 | ORAL_TABLET | Freq: Four times a day (QID) | ORAL | Status: DC | PRN

## 2011-07-16 MED ORDER — BUPIVACAINE HCL 0.5 % IJ SOLN
5.0000 mL | Freq: Once | INTRAMUSCULAR | Status: AC
  Administered 2011-07-16: 5 mL
  Filled 2011-07-16: qty 5

## 2011-07-16 NOTE — ED Notes (Signed)
Lt. Lower toothache began 1 week ago

## 2011-07-16 NOTE — Discharge Instructions (Signed)
Dental Pain  A tooth ache may be caused by cavities (tooth decay). Cavities expose the nerve of the tooth to air and hot or cold temperatures. It may come from an infection or abscess (also called a boil or furuncle) around your tooth. It is also often caused by dental caries (tooth decay). This causes the pain you are having.  DIAGNOSIS   Your caregiver can diagnose this problem by exam.  TREATMENT   · If caused by an infection, it may be treated with medications which kill germs (antibiotics) and pain medications as prescribed by your caregiver. Take medications as directed.  · Only take over-the-counter or prescription medicines for pain, discomfort, or fever as directed by your caregiver.  · Whether the tooth ache today is caused by infection or dental disease, you should see your dentist as soon as possible for further care.  SEEK MEDICAL CARE IF:  The exam and treatment you received today has been provided on an emergency basis only. This is not a substitute for complete medical or dental care. If your problem worsens or new problems (symptoms) appear, and you are unable to meet with your dentist, call or return to this location.  SEEK IMMEDIATE MEDICAL CARE IF:   · You have a fever.  · You develop redness and swelling of your face, jaw, or neck.  · You are unable to open your mouth.  · You have severe pain uncontrolled by pain medicine.  MAKE SURE YOU:   · Understand these instructions.  · Will watch your condition.  · Will get help right away if you are not doing well or get worse.  Document Released: 04/23/2005 Document Revised: 04/12/2011 Document Reviewed: 12/10/2007  [employer]® Patient Information ©2012 [employer].

## 2011-07-16 NOTE — ED Notes (Signed)
Pt. Took 3 percocets prior to coming to ED and also took a Tramadol

## 2011-07-16 NOTE — ED Notes (Signed)
Patient states seen 3 dentist for dental pain left lower tooth.  Does not take his insurance [employer].  Pain 8-10/10 throbbing. Airway intact bilateral equal chest rise and fall.

## 2011-07-16 NOTE — ED Provider Notes (Signed)
History     CSN: [id number]  Arrival date & time 07/16/11  1152   None     Chief Complaint  Patient presents with  . Dental Pain    (Consider location/radiation/quality/duration/timing/severity/associated sxs/prior treatment) Patient is a 71 y.o. male presenting with tooth pain. The history is provided by the patient.  Dental PainPrimary symptoms do not include fever or sore throat.  Additional symptoms do not include: trouble swallowing and drooling.   patient had dental pain from broken off tooth the last week. No relief with Percocet and Ultram. No fevers. No trauma. He states he is hoping we would pull it out or have someone to come in to do it. No swelling in his mouth.  Past Medical History  Diagnosis Date  . Chronic headaches   . Arthritis   . Lumbar disc disease   . Hypertension   . Meningitis 10/2010    related to molart abscess   . COPD (chronic obstructive pulmonary disease)   . Hyperlipidemia   . Chronic LBP     History reviewed. No pertinent past surgical history.  No family history on file.  History  Substance Use Topics  . Smoking status: Former Games developer  . Smokeless tobacco: Not on file   Comment: quit 2 weeks ago  . Alcohol Use: No      Review of Systems  Constitutional: Negative for fever and chills.  HENT: Positive for dental problem. Negative for sore throat, drooling, mouth sores and trouble swallowing.     Allergies  Review of patient's allergies indicates no known allergies.  Home Medications   Current Outpatient Rx  Name Route Sig Dispense Refill  . ALBUTEROL SULFATE HFA 108 (90 BASE) MCG/ACT IN AERS Inhalation Inhale 2 puffs into the lungs every 6 (six) hours as needed. For shortness of breath     . ASPIRIN 81 MG PO TABS Oral Take 81 mg by mouth daily.      [name] HYDROCHLOROTHIAZIDE 25 MG PO TABS Oral Take 25 mg by mouth daily.      [name] MECLIZINE HCL 12.5 MG PO TABS Oral Take 12.5 mg by mouth 3 (three) times daily as needed. For  dizziness     . OXYCODONE-ACETAMINOPHEN 5-325 MG PO TABS Oral Take 3 tablets by mouth daily as needed. For pain    . TRAMADOL HCL 50 MG PO TABS Oral Take 50 mg by mouth every 6 (six) hours as needed. For pain    . OXYCODONE-ACETAMINOPHEN 5-325 MG PO TABS Oral Take 1-2 tablets by mouth every 6 (six) hours as needed for pain. 15 tablet 0  . PENICILLIN V POTASSIUM 500 MG PO TABS Oral Take 1 tablet (500 mg total) by mouth 4 (four) times daily. 40 tablet 0    BP 195/95  Pulse 64  Temp(Src) 99 F (37.2 C) (Oral)  Resp 16  SpO2 95%  Physical Exam  Constitutional: He appears well-developed.  HENT:  Head: Normocephalic.       Left lower fourth tooth from midline with large cavity. No fluctuance it done. Some tenderness to the tooth. No swelling of floor of mouth.    ED Course  Dental Date/Time: 07/16/2011 2:12 PM Performed by: Billee Cashing. Authorized by: Billee Cashing Consent: Verbal consent obtained. Written consent not obtained. Risks and benefits: risks, benefits and alternatives were discussed Consent given by: patient Patient understanding: patient states understanding of the procedure being performed Patient consent: the patient's understanding of the procedure matches consent given Procedure consent:  procedure consent matches procedure scheduled Relevant documents: relevant documents present and verified Test results: test results available and properly labeled Site marked: the operative site was marked Imaging studies: imaging studies not available Required items: required blood products, implants, devices, and special equipment available Patient identity confirmed: verbally with patient and arm band Time out: Immediately prior to procedure a "time out" was called to verify the correct patient, procedure, equipment, support staff and site/side marked as required. Comments: Dental block was done. 2 cc of 0.5% Marcaine was injected in the left lower fourth tooth  from midline. 1 cc was injected medially and 1 cc laterally. There is no immediate complications.   (including critical care time)  Labs Reviewed - No data to display No results found.   1. Pain, dental       MDM  Dental pain. Bad tooth. Dental block was done. Additionally given some pain medicine, he states he is out of his own. He'll also get antibiotics. He'll follow with the dentist        Juliet Rude. Rubin Payor, MD 07/16/11 1415

## 2011-08-21 ENCOUNTER — Other Ambulatory Visit: Payer: Self-pay | Admitting: Internal Medicine

## 2011-08-27 ENCOUNTER — Telehealth: Payer: Self-pay

## 2011-08-27 MED ORDER — HYDROCHLOROTHIAZIDE 25 MG PO TABS: 25.0000 mg | ORAL_TABLET | Freq: Every day | ORAL | Status: DC

## 2011-08-27 NOTE — Telephone Encounter (Signed)
The patients daughter called stating the patient has been having headaches and some chest pain.  She states there has been no other symptoms. Please advise the patient is scheduled for appt. Tomorrow 08/28/11 with ZOX

## 2011-08-27 NOTE — Telephone Encounter (Signed)
Will see at OV, I could not offer more until then

## 2011-08-28 ENCOUNTER — Encounter: Payer: Self-pay | Admitting: Internal Medicine

## 2011-08-28 ENCOUNTER — Ambulatory Visit (INDEPENDENT_AMBULATORY_CARE_PROVIDER_SITE_OTHER): Payer: Medicare Other | Admitting: Internal Medicine

## 2011-08-28 ENCOUNTER — Encounter: Payer: Self-pay | Admitting: Neurology

## 2011-08-28 DIAGNOSIS — M79609 Pain in unspecified limb: Secondary | ICD-10-CM

## 2011-08-28 DIAGNOSIS — R079 Chest pain, unspecified: Secondary | ICD-10-CM

## 2011-08-28 DIAGNOSIS — N400 Enlarged prostate without lower urinary tract symptoms: Secondary | ICD-10-CM

## 2011-08-28 DIAGNOSIS — R202 Paresthesia of skin: Secondary | ICD-10-CM

## 2011-08-28 DIAGNOSIS — I10 Essential (primary) hypertension: Secondary | ICD-10-CM

## 2011-08-28 DIAGNOSIS — R42 Dizziness and giddiness: Secondary | ICD-10-CM

## 2011-08-28 DIAGNOSIS — R413 Other amnesia: Secondary | ICD-10-CM

## 2011-08-28 DIAGNOSIS — R51 Headache: Secondary | ICD-10-CM

## 2011-08-28 MED ORDER — TAMSULOSIN HCL 0.4 MG PO CAPS: 0.4000 mg | ORAL_CAPSULE | Freq: Every day | ORAL | Status: DC

## 2011-08-28 MED ORDER — AMLODIPINE BESYLATE 5 MG PO TABS: 5.0000 mg | ORAL_TABLET | Freq: Every day | ORAL | Status: DC

## 2011-08-28 NOTE — Assessment & Plan Note (Signed)
liekly benign HA given neg MRI June 2012,  to f/u any worsening symptoms or concerns

## 2011-08-28 NOTE — Assessment & Plan Note (Signed)
Recent onset, MRI June 2012 neg for acute, will hold on further imaging at this time, for neurology eval as well as for HA, dizziness, and RUE predicament

## 2011-08-28 NOTE — Assessment & Plan Note (Signed)
More symptomatic, for flomax, refer urology

## 2011-08-28 NOTE — Assessment & Plan Note (Addendum)
Also for UE NCS/EMG, neuro eval, will try to obtain most recent Dr Orlan Leavens eval

## 2011-08-28 NOTE — Assessment & Plan Note (Signed)
For tylenol prn,  to f/u any worsening symptoms or concerns 

## 2011-08-28 NOTE — Progress Notes (Signed)
Subjective:    Patient ID: Adrian Miller, male    DOB: 28-Jun-1940, 71 y.o.   MRN: [id number]  HPI  Here with family with confusing hx  - ostensibly here for CP and HA's but then with numerous other complaints, pt c/o 4 wks intermittent diffuse chest pain, but with repeated questioning o/w vague hx - unable to characterize for instance sharp or dull, pleuritic or exertional but apparently without radiation or assoc factors such as n/v, sob or doe, wheezing, orthopnea, PND, increased LE swelling, palpitations, dizziness or syncope.  Also seems somewhat confused today with some memory difficulty family states ongoing since about July last yr; BP has been elevated several times along with recurring headaches, dizziness, and achiness of the legs by which he seems to localize most at the left knee s/p TKR with mild discomfort and swelling.  Also mentions about 6 mo gradual increase urinary hesitancy and slower stream but not overly straining to urinate, no fever, flank pain, passed stones or hematuria. Also with persistent distal RUE hand/wrist sweling and numbness of several fingers, has seen Dr Orlan Leavens, and referred elsewhere but pt and family unable to explain further, stating pt just gave up after saw Dr Orlan Leavens.  Does not describe any NCS done, though this was ordered per family.   Past Medical History  Diagnosis Date  . Chronic headaches   . Arthritis   . Lumbar disc disease   . Hypertension   . Meningitis 10/2010    related to molart abscess   . COPD (chronic obstructive pulmonary disease)   . Hyperlipidemia   . Chronic LBP    No past surgical history on file.  reports that he has quit smoking. He does not have any smokeless tobacco history on file. He reports that he does not drink alcohol or use illicit drugs. family history is not on file. No Known Allergies Current Outpatient Prescriptions on File Prior to Visit  Medication Sig Dispense Refill  . hydrochlorothiazide (HYDRODIURIL) 25 MG tablet  Take 1 tablet (25 mg total) by mouth daily.  90 tablet  1  . meclizine (ANTIVERT) 12.5 MG tablet Take 12.5 mg by mouth 3 (three) times daily as needed. For dizziness       . albuterol (VENTOLIN HFA) 108 (90 BASE) MCG/ACT inhaler Inhale 2 puffs into the lungs every 6 (six) hours as needed. For shortness of breath       . amLODipine (NORVASC) 5 MG tablet Take 1 tablet (5 mg total) by mouth daily.  90 tablet  3  . aspirin 81 MG tablet Take 81 mg by mouth daily.        [name] oxyCODONE-acetaminophen (PERCOCET) 5-325 MG per tablet Take 3 tablets by mouth daily as needed. For pain      . oxyCODONE-acetaminophen (PERCOCET) 5-325 MG per tablet Take 1-2 tablets by mouth every 6 (six) hours as needed for pain.  15 tablet  0  . traMADol (ULTRAM) 50 MG tablet Take 50 mg by mouth every 6 (six) hours as needed. For pain       Review of Systems Review of Systems  Constitutional: Negative for diaphoresis and unexpected weight change.  HENT: Negative for drooling and tinnitus.   Eyes: Negative for photophobia and visual disturbance.  Respiratory: Negative for choking and stridor.   Gastrointestinal: Negative for vomiting and blood in stool.  Genitourinary: Negative for hematuria and decreased urine volume.  Musculoskeletal: Negative for gait problem.  Skin: Negative for color change and wound.  Neurological: Negative for tremors.    Objective:   Physical Exam BP 158/84  Pulse 66  Temp(Src) 99.7 F (37.6 C) (Oral)  Ht 5\' 9"  (1.753 m)  Wt 221 lb 4 oz (100.358 kg)  BMI 32.67 kg/m2  SpO2 94% Physical Exam  VS noted, not ill appearing Constitutional: Pt appears well-developed and well-nourished.  HENT: Head: Normocephalic.  Right Ear: External ear normal.  Left Ear: External ear normal.  Eyes: Conjunctivae and EOM are normal. Pupils are equal, round, and reactive to light.  Neck: Normal range of motion. Neck supple.  Cardiovascular: Normal rate and regular rhythm.   Pulmonary/Chest: Effort normal and  breath sounds normal.  Abd:  Soft, NT, non-distended, + BS Neurological: Pt is alert. No cranial nerve deficit. o/w not done in detail Distal RUE with mild swelling lateral wrist  Left knee s/p TKR, mild effusion, NT but mild decreased ROM Skin: Skin is warm. No erythema.  Psychiatric: Pt behavior is normal. Thought content with some cognitive slowing, mild confusion    Assessment & Plan:

## 2011-08-28 NOTE — Patient Instructions (Addendum)
Take all new medications as prescribed  - the generic flomax for the prostate, and amlodipine 5 mg per day for the blood pressure Continue all other medications as before Please go to XRAY in the Basement for the x-ray test You will be contacted regarding the referral for: [facility],  Urology, Stress test, and the Nerve test for the arm Please take tylenol for the leg and knee pain as needed

## 2011-08-28 NOTE — Assessment & Plan Note (Addendum)
Uncontrolled, to add amlod 5 qd,  to f/u any worsening symptoms or concerns BP Readings from Last 3 Encounters:  08/28/11 158/84  07/16/11 195/95  03/13/11 140/82

## 2011-08-28 NOTE — Assessment & Plan Note (Signed)
Etiology unclear, to cont meclziine prn,  to f/u any worsening symptoms or concerns

## 2011-08-28 NOTE — Assessment & Plan Note (Addendum)
ECG reviewed as per emr, atypical, for cxr and stress test for further evalaution, Continue all other medications as before,  to f/u any worsening symptoms or concerns  Note:  Time for pt hx and exam, review of record with pt and family in room, determination of diagnosis and plan for further eval and tx is > 40 min

## 2011-09-06 ENCOUNTER — Encounter: Payer: Self-pay | Admitting: Internal Medicine

## 2011-09-13 ENCOUNTER — Encounter (HOSPITAL_COMMUNITY): Payer: Medicare Other

## 2011-11-01 ENCOUNTER — Ambulatory Visit: Payer: Medicare Other | Admitting: Neurology

## 2012-02-05 ENCOUNTER — Inpatient Hospital Stay (HOSPITAL_COMMUNITY)
Admission: EM | Admit: 2012-02-05 | Discharge: 2012-02-08 | DRG: 193 | Disposition: A | Discharge status: Home / Self Care | Payer: Medicare Other | Attending: Family Medicine | Admitting: Family Medicine

## 2012-02-05 ENCOUNTER — Ambulatory Visit (INDEPENDENT_AMBULATORY_CARE_PROVIDER_SITE_OTHER): Payer: Medicare Other | Admitting: Emergency Medicine

## 2012-02-05 ENCOUNTER — Telehealth: Payer: Self-pay | Admitting: Internal Medicine

## 2012-02-05 ENCOUNTER — Encounter (HOSPITAL_COMMUNITY): Payer: Self-pay | Admitting: Emergency Medicine

## 2012-02-05 ENCOUNTER — Emergency Department (HOSPITAL_COMMUNITY): Payer: Medicare Other

## 2012-02-05 ENCOUNTER — Ambulatory Visit: Payer: Medicare Other

## 2012-02-05 VITALS — BP 152/82 | HR 113 | Temp 102.9°F | Resp 17 | Ht 68.5 in | Wt 203.0 lb

## 2012-02-05 DIAGNOSIS — N179 Acute kidney failure, unspecified: Secondary | ICD-10-CM | POA: Diagnosis present

## 2012-02-05 DIAGNOSIS — Z7982 Long term (current) use of aspirin: Secondary | ICD-10-CM

## 2012-02-05 DIAGNOSIS — J4489 Other specified chronic obstructive pulmonary disease: Secondary | ICD-10-CM | POA: Diagnosis present

## 2012-02-05 DIAGNOSIS — M51379 Other intervertebral disc degeneration, lumbosacral region without mention of lumbar back pain or lower extremity pain: Secondary | ICD-10-CM | POA: Diagnosis present

## 2012-02-05 DIAGNOSIS — M5137 Other intervertebral disc degeneration, lumbosacral region: Secondary | ICD-10-CM | POA: Diagnosis present

## 2012-02-05 DIAGNOSIS — Z87891 Personal history of nicotine dependence: Secondary | ICD-10-CM

## 2012-02-05 DIAGNOSIS — Z8661 Personal history of infections of the central nervous system: Secondary | ICD-10-CM

## 2012-02-05 DIAGNOSIS — Z6831 Body mass index (BMI) 31.0-31.9, adult: Secondary | ICD-10-CM

## 2012-02-05 DIAGNOSIS — M545 Low back pain, unspecified: Secondary | ICD-10-CM

## 2012-02-05 DIAGNOSIS — N289 Disorder of kidney and ureter, unspecified: Secondary | ICD-10-CM

## 2012-02-05 DIAGNOSIS — R509 Fever, unspecified: Secondary | ICD-10-CM

## 2012-02-05 DIAGNOSIS — E860 Dehydration: Secondary | ICD-10-CM

## 2012-02-05 DIAGNOSIS — G8929 Other chronic pain: Secondary | ICD-10-CM

## 2012-02-05 DIAGNOSIS — E785 Hyperlipidemia, unspecified: Secondary | ICD-10-CM | POA: Diagnosis present

## 2012-02-05 DIAGNOSIS — Z23 Encounter for immunization: Secondary | ICD-10-CM

## 2012-02-05 DIAGNOSIS — R059 Cough, unspecified: Secondary | ICD-10-CM

## 2012-02-05 DIAGNOSIS — Z79899 Other long term (current) drug therapy: Secondary | ICD-10-CM

## 2012-02-05 DIAGNOSIS — R112 Nausea with vomiting, unspecified: Secondary | ICD-10-CM

## 2012-02-05 DIAGNOSIS — E43 Unspecified severe protein-calorie malnutrition: Secondary | ICD-10-CM | POA: Diagnosis present

## 2012-02-05 DIAGNOSIS — J189 Pneumonia, unspecified organism: Principal | ICD-10-CM | POA: Diagnosis present

## 2012-02-05 DIAGNOSIS — R197 Diarrhea, unspecified: Secondary | ICD-10-CM

## 2012-02-05 DIAGNOSIS — R05 Cough: Secondary | ICD-10-CM

## 2012-02-05 DIAGNOSIS — J449 Chronic obstructive pulmonary disease, unspecified: Secondary | ICD-10-CM

## 2012-02-05 DIAGNOSIS — N400 Enlarged prostate without lower urinary tract symptoms: Secondary | ICD-10-CM | POA: Diagnosis present

## 2012-02-05 DIAGNOSIS — I10 Essential (primary) hypertension: Secondary | ICD-10-CM | POA: Diagnosis present

## 2012-02-05 LAB — POCT UA - MICROSCOPIC ONLY
Casts, Ur, LPF, POC: NEGATIVE
Crystals, Ur, HPF, POC: NEGATIVE
Yeast, UA: NEGATIVE

## 2012-02-05 LAB — COMPREHENSIVE METABOLIC PANEL
ALT: 31 U/L (ref 0–53)
AST: 33 U/L (ref 0–37)
Albumin: 3.7 g/dL (ref 3.5–5.2)
Alkaline Phosphatase: 100 U/L (ref 39–117)
BUN: 21 mg/dL (ref 6–23)
CO2: 24 mEq/L (ref 19–32)
Calcium: 9 mg/dL (ref 8.4–10.5)
Chloride: 97 mEq/L (ref 96–112)
Creatinine, Ser: 1.39 mg/dL — ABNORMAL HIGH (ref 0.50–1.35)
GFR calc Af Amer: 58 mL/min — ABNORMAL LOW (ref >90)
GFR calc non Af Amer: 50 mL/min — ABNORMAL LOW (ref >90)
Glucose, Bld: 136 mg/dL — ABNORMAL HIGH (ref 70–99)
Potassium: 3.6 mEq/L (ref 3.5–5.1)
Sodium: 133 mEq/L — ABNORMAL LOW (ref 135–145)
Total Bilirubin: 0.8 mg/dL (ref 0.3–1.2)
Total Protein: 7.7 g/dL (ref 6.0–8.3)

## 2012-02-05 LAB — POCT URINALYSIS DIPSTICK
Blood, UA: NEGATIVE
Glucose, UA: NEGATIVE
Leukocytes, UA: NEGATIVE
Nitrite, UA: NEGATIVE
Protein, UA: 30
Spec Grav, UA: >=1.03
Urobilinogen, UA: 1
pH, UA: 5

## 2012-02-05 LAB — CBC WITH DIFFERENTIAL/PLATELET
Basophils Absolute: 0 10*3/uL (ref 0.0–0.1)
Basophils Relative: 0 % (ref 0–1)
Eosinophils Absolute: 0 10*3/uL (ref 0.0–0.7)
Eosinophils Relative: 0 % (ref 0–5)
HCT: 45.6 % (ref 39.0–52.0)
Hemoglobin: 16.3 g/dL (ref 13.0–17.0)
Lymphocytes Relative: 7 % — ABNORMAL LOW (ref 12–46)
Lymphs Abs: 1.2 10*3/uL (ref 0.7–4.0)
MCH: 31.2 pg (ref 26.0–34.0)
MCHC: 35.7 g/dL (ref 30.0–36.0)
MCV: 87.4 fL (ref 78.0–100.0)
Monocytes Absolute: 0.9 10*3/uL (ref 0.1–1.0)
Monocytes Relative: 5 % (ref 3–12)
Neutro Abs: 14.7 10*3/uL — ABNORMAL HIGH (ref 1.7–7.7)
Neutrophils Relative %: 87 % — ABNORMAL HIGH (ref 43–77)
Platelets: 232 10*3/uL (ref 150–400)
RBC: 5.22 MIL/uL (ref 4.22–5.81)
RDW: 15.3 % (ref 11.5–15.5)
WBC: 16.8 10*3/uL — ABNORMAL HIGH (ref 4.0–10.5)

## 2012-02-05 LAB — POCT CBC
Granulocyte percent: 91.1 %G — AB (ref 37–80)
HCT, POC: 54.5 % — AB (ref 43.5–53.7)
Hemoglobin: 17.7 g/dL (ref 14.1–18.1)
Lymph, poc: 1.2 (ref 0.6–3.4)
MCH, POC: 30.6 pg (ref 27–31.2)
MCHC: 32.5 g/dL (ref 31.8–35.4)
MCV: 94.2 fL (ref 80–97)
MID (cbc): 0.6 (ref 0–0.9)
MPV: 8.2 fL (ref 0–99.8)
POC Granulocyte: 18.4 — AB (ref 2–6.9)
POC LYMPH PERCENT: 5.9 %L — AB (ref 10–50)
POC MID %: 3 %M (ref 0–12)
Platelet Count, POC: 296 10*3/uL (ref 142–424)
RBC: 5.79 M/uL (ref 4.69–6.13)
RDW, POC: 15.5 %
WBC: 20.2 10*3/uL — AB (ref 4.6–10.2)

## 2012-02-05 LAB — IFOBT (OCCULT BLOOD): IFOBT: NEGATIVE

## 2012-02-05 LAB — LACTIC ACID, PLASMA: Lactic Acid, Venous: 1.4 mmol/L (ref 0.5–2.2)

## 2012-02-05 MED ORDER — ONDANSETRON HCL 4 MG/2ML IJ SOLN
4.0000 mg | Freq: Once | INTRAMUSCULAR | Status: AC
  Administered 2012-02-05: 4 mg via INTRAVENOUS
  Filled 2012-02-05: qty 2

## 2012-02-05 MED ORDER — SODIUM CHLORIDE 0.9 % IV BOLUS (SEPSIS)
1000.0000 mL | Freq: Once | INTRAVENOUS | Status: AC
  Administered 2012-02-05: 1000 mL via INTRAVENOUS

## 2012-02-05 MED ORDER — MORPHINE SULFATE 4 MG/ML IJ SOLN
4.0000 mg | Freq: Once | INTRAMUSCULAR | Status: AC
  Administered 2012-02-05: 4 mg via INTRAVENOUS
  Filled 2012-02-05: qty 1

## 2012-02-05 MED ORDER — DEXTROSE 5 % IV SOLN
1.0000 g | Freq: Once | INTRAVENOUS | Status: AC
  Administered 2012-02-05: 1 g via INTRAVENOUS
  Filled 2012-02-05: qty 10

## 2012-02-05 MED ORDER — DEXTROSE 5 % IV SOLN
500.0000 mg | Freq: Once | INTRAVENOUS | Status: AC
  Administered 2012-02-05: 500 mg via INTRAVENOUS
  Filled 2012-02-05: qty 500

## 2012-02-05 NOTE — Progress Notes (Signed)
[facility] [address], [place] [address] [phone]  Date:  02/05/2012   Name:  Adrian Miller   DOB:  Nov 12, 1940   MRN:  [id number]  PCP:  Oliver Barre, MD    Chief Complaint: Dizziness, Emesis and Nausea   History of Present Illness:  Adrian Miller is a 71 y.o. very pleasant male patient who presents with the following:  Ill since Friday with fever and cough.  Has nausea and vomiting, diarrhea frequently with black stool.  Not using pepto bismol.  Fever past 48 hours.  Poor po intake.  Has some numbness in hands. No visual symptoms.  Says dizzy.   No shortness of breath or wheezing.  No abdominal pain or GU symptoms.  No rash.  Patient Active Problem List  Diagnosis  . Arthritis  . Hypertension  . Lumbar disc disease  . Meningitis  . Preventative health care  . COPD (chronic obstructive pulmonary disease)  . Hyperlipidemia  . Chronic LBP  . Degenerative arthritis of hip  . Left knee DJD  . Headache  . Dizziness  . Memory loss  . Paresthesia and pain of right extremity  . BPH (benign prostatic hyperplasia)  . Chest pain    Past Medical History  Diagnosis Date  . Chronic headaches   . Arthritis   . Lumbar disc disease   . Hypertension   . Meningitis 10/2010    related to molart abscess   . COPD (chronic obstructive pulmonary disease)   . Hyperlipidemia   . Chronic LBP     No past surgical history on file.  History  Substance Use Topics  . Smoking status: Former Games developer  . Smokeless tobacco: Not on file   Comment: quit 2 weeks ago  . Alcohol Use: No    No family history on file.  No Known Allergies  Medication list has been reviewed and updated.  Current Outpatient Prescriptions on File Prior to Visit  Medication Sig Dispense Refill  . aspirin 81 MG tablet Take 81 mg by mouth daily.        . hydrochlorothiazide (HYDRODIURIL) 25 MG tablet Take 1 tablet (25 mg total) by mouth daily.  90 tablet  1  . oxyCODONE-acetaminophen  (PERCOCET) 5-325 MG per tablet Take 3 tablets by mouth daily as needed. For pain      . traMADol (ULTRAM) 50 MG tablet Take 50 mg by mouth every 6 (six) hours as needed. For pain      . albuterol (VENTOLIN HFA) 108 (90 BASE) MCG/ACT inhaler Inhale 2 puffs into the lungs every 6 (six) hours as needed. For shortness of breath       . amLODipine (NORVASC) 5 MG tablet Take 1 tablet (5 mg total) by mouth daily.  90 tablet  3  . meclizine (ANTIVERT) 12.5 MG tablet Take 12.5 mg by mouth 3 (three) times daily as needed. For dizziness       . oxyCODONE-acetaminophen (PERCOCET) 5-325 MG per tablet Take 1-2 tablets by mouth every 6 (six) hours as needed for pain.  15 tablet  0  . Tamsulosin HCl (FLOMAX) 0.4 MG CAPS Take 1 capsule (0.4 mg total) by mouth daily.  90 capsule  3    Review of Systems:  As per HPI, otherwise negative.    Physical Examination: Filed Vitals:   02/05/12 1837  BP: 152/82  Pulse: 113  Temp: 102.9 F (39.4 C)  Resp: 17   Filed Vitals:  02/05/12 1837  Height: 5' 8.5" (1.74 m)  Weight: 203 lb (92.08 kg)   Body mass index is 30.42 kg/(m^2). Ideal Body Weight: Weight in (lb) to have BMI = 25: 166.5   GEN: WDWN, NAD, Non-toxic, A & O x 3 HEENT: Atraumatic, Normocephalic. Neck supple. No masses, No LAD. Ears and Nose: No external deformity. CV: RRR, No M/G/R. No JVD. No thrill. No extra heart sounds. PULM:  B, no wheezes, right basilar rales, rhonchi. No retractions. No resp. distress. No accessory muscle use. ABD: S, NT, ND, +BS. No rebound. No HSM. EXTR: No c/c/e NEURO Normal gait.  PSYCH: Normally interactive. Conversant. Not depressed or anxious appearing.  Calm demeanor.  RECTAL:  Heme negative  Assessment and Plan: Nausea, vomiting and diarrhea Cough Fever and chills Dehydration IV fluids  To the ER via EMS   [facility] reading (PRIMARY) by  Dr. Dareen Piano.  No acute process.    Results for orders placed in visit on 02/05/12  POCT CBC      Component  Value Range   WBC 20.2 (*) 4.6 - 10.2 K/uL   Lymph, poc 1.2  0.6 - 3.4   POC LYMPH PERCENT 5.9 (*) 10 - 50 %L   MID (cbc) 0.6  0 - 0.9   POC MID % 3.0  0 - 12 %M   POC Granulocyte 18.4 (*) 2 - 6.9   Granulocyte percent 91.1 (*) 37 - 80 %G   RBC 5.79  4.69 - 6.13 M/uL   Hemoglobin 17.7  14.1 - 18.1 g/dL   HCT, POC 16.1 (*) 09.6 - 53.7 %   MCV 94.2  80 - 97 fL   MCH, POC 30.6  27 - 31.2 pg   MCHC 32.5  31.8 - 35.4 g/dL   RDW, POC 04.5     Platelet Count, POC 296  142 - 424 K/uL   MPV 8.2  0 - 99.8 fL  POCT URINALYSIS DIPSTICK      Component Value Range   Color, UA brown     Clarity, UA cloudy     Glucose, UA neg     Bilirubin, UA small     Ketones, UA trace     Spec Grav, UA >=1.030     Blood, UA neg     pH, UA 5.0     Protein, UA 30     Urobilinogen, UA 1.0     Nitrite, UA neg     Leukocytes, UA Negative    POCT UA - MICROSCOPIC ONLY      Component Value Range   WBC, Ur, HPF, POC 0-1     RBC, urine, microscopic 0-1     Bacteria, U Microscopic trace     Mucus, UA 3+     Epithelial cells, urine per micros 0-2     Crystals, Ur, HPF, POC neg     Casts, Ur, LPF, POC neg     Yeast, UA neg    IFOBT (OCCULT BLOOD)      Component Value Range   IFOBT Negative       Carmelina Dane, MD  I have reviewed and agree with documentation. Robert P. Merla Riches, M.D.

## 2012-02-05 NOTE — Telephone Encounter (Signed)
Caller: Laura/Child; Patient Name: Adrian Miller; PCP: Oliver Barre (Adults only); Best Callback Phone Number: [phone].  Caller calling about vomiting and diarrhea.  States diarrhea has blood in it.  Temp 99.6 O.  Onset 02/05/12.  Seen in orthopedics office 02/05/12 but states he was not feeling well.  Caller states he looks very weak, and she is concerned.  Patient states he is vomiting coffee-grounds appearing emesis and his stools are tarry and black.  BP currently 154/85.  Per diarrhea protocol, advised 911; family declines but states they will go to [facility] ED.

## 2012-02-05 NOTE — ED Provider Notes (Signed)
History     CSN: [id number]  Arrival date & time 02/05/12  2042   First MD Initiated Contact with Patient 02/05/12 2143      Chief Complaint  Patient presents with  . Nausea  . Emesis  . Diarrhea    (Consider location/radiation/quality/duration/timing/severity/associated sxs/prior treatment) HPI Patient presents emergency Department, nausea, vomiting, and cough, that started this morning.  Patient, states he went to the urgent care center and was found to be febrile and sent to the emergency department.  Patient denies chest pain, headache, visual changes, syncope, weakness, myalgias, dysuria, or abdominal pain.  Patient, states, that he did not take anything for his symptoms prior to arrival.  Past Medical History  Diagnosis Date  . Chronic headaches   . Arthritis   . Lumbar disc disease   . Hypertension   . Meningitis 10/2010    related to molart abscess   . COPD (chronic obstructive pulmonary disease)   . Hyperlipidemia   . Chronic LBP   . BPH (benign prostatic hypertrophy)     History reviewed. No pertinent past surgical history.  History reviewed. No pertinent family history.  History  Substance Use Topics  . Smoking status: Former Games developer  . Smokeless tobacco: Not on file   Comment: quit 2 weeks ago  . Alcohol Use: No      Review of Systems All other systems negative except as documented in the HPI. All pertinent positives and negatives as reviewed in the HPI.  Allergies  Review of patient's allergies indicates no known allergies.  Home Medications   Current Outpatient Rx  Name Route Sig Dispense Refill  . ALBUTEROL SULFATE HFA 108 (90 BASE) MCG/ACT IN AERS Inhalation Inhale 2 puffs into the lungs every 6 (six) hours as needed. For shortness of breath     . AMLODIPINE BESYLATE 5 MG PO TABS Oral Take 1 tablet (5 mg total) by mouth daily. 90 tablet 3  . ASPIRIN 81 MG PO TABS Oral Take 81 mg by mouth daily.      [name] HYDROCHLOROTHIAZIDE 25 MG PO TABS Oral  Take 1 tablet (25 mg total) by mouth daily. 90 tablet 1  . MECLIZINE HCL 12.5 MG PO TABS Oral Take 12.5 mg by mouth 3 (three) times daily as needed. For dizziness     . OXYCODONE-ACETAMINOPHEN 5-325 MG PO TABS Oral Take 3 tablets by mouth daily as needed. For pain    . OXYCODONE-ACETAMINOPHEN 5-325 MG PO TABS Oral Take 1-2 tablets by mouth every 6 (six) hours as needed for pain. 15 tablet 0  . TAMSULOSIN HCL 0.4 MG PO CAPS Oral Take 1 capsule (0.4 mg total) by mouth daily. 90 capsule 3  . TRAMADOL HCL 50 MG PO TABS Oral Take 50 mg by mouth every 6 (six) hours as needed. For pain      BP 160/77  Pulse 83  Temp 101.8 F (38.8 C) (Oral)  Resp 16  SpO2 97%  Physical Exam  Nursing note and vitals reviewed. Constitutional: He is oriented to person, place, and time. He appears well-developed and well-nourished. No distress.  HENT:  Head: Normocephalic and atraumatic.  Mouth/Throat: Oropharynx is clear and moist.  Eyes: Pupils are equal, round, and reactive to light.  Neck: Normal range of motion. Neck supple.  Cardiovascular: Normal rate, regular rhythm and normal heart sounds.   Pulmonary/Chest: Effort normal and breath sounds normal. No respiratory distress.       Patient has crackles at the bases bilaterally  Neurological:  He is alert and oriented to person, place, and time.  Skin: Skin is warm and dry. No rash noted.    ED Course  Procedures (including critical care time)  Labs Reviewed - No data to display Dg Chest 2 View  02/05/2012  *RADIOLOGY REPORT*  Clinical Data: Vomiting.  Cough and fever.  CHEST - 2 VIEW  Comparison: None.  Findings: The cardiac silhouette, mediastinal and hilar contours are within normal limits for age.  There is mild tortuosity and ectasia of the thoracic aorta.  The lungs are clear.  No pleural effusion.  The bony thorax is intact.  Degenerative changes noted in the mid thoracic spine.  IMPRESSION: No acute cardiopulmonary findings.   Original Report  Authenticated By: P. Loralie Champagne, M.D.    Patient most likely need admission, based on his symptoms and continued fever.  Patient has been stable otherwise in the emergency department.  Patient was started on antibiotics for community acquired pneumonia.   MDM  MDM Reviewed: vitals, nursing note and previous chart Reviewed previous: labs Interpretation: labs and x-ray Consults: admitting MD            Carlyle Dolly, PA-C 02/06/12 [phone]

## 2012-02-05 NOTE — ED Notes (Signed)
Per EMS pt sent here from [facility] with c/o N/V/D, fever, and cough  Sxs started on Thursday of last week  Pt states he feels weak and tired and has only had one episode of vomiting today  Pt has an IV established in his right [facility] from urgent care  O2 applied by EMS for pt comfort as requested by the pt

## 2012-02-06 ENCOUNTER — Encounter (HOSPITAL_COMMUNITY): Payer: Self-pay | Admitting: Internal Medicine

## 2012-02-06 DIAGNOSIS — N289 Disorder of kidney and ureter, unspecified: Secondary | ICD-10-CM

## 2012-02-06 DIAGNOSIS — J189 Pneumonia, unspecified organism: Principal | ICD-10-CM

## 2012-02-06 DIAGNOSIS — J449 Chronic obstructive pulmonary disease, unspecified: Secondary | ICD-10-CM

## 2012-02-06 DIAGNOSIS — G8929 Other chronic pain: Secondary | ICD-10-CM

## 2012-02-06 DIAGNOSIS — M545 Low back pain, unspecified: Secondary | ICD-10-CM

## 2012-02-06 DIAGNOSIS — R509 Fever, unspecified: Secondary | ICD-10-CM

## 2012-02-06 LAB — TSH: TSH: 0.372 u[IU]/mL (ref 0.350–4.500)

## 2012-02-06 MED ORDER — INFLUENZA VIRUS VACC SPLIT PF IM SUSP
0.5000 mL | INTRAMUSCULAR | Status: AC
  Administered 2012-02-07: 0.5 mL via INTRAMUSCULAR
  Filled 2012-02-06: qty 0.5

## 2012-02-06 MED ORDER — ASPIRIN EC 81 MG PO TBEC: 81.0000 mg | DELAYED_RELEASE_TABLET | Freq: Every day | ORAL | Status: DC

## 2012-02-06 MED ORDER — MORPHINE SULFATE 4 MG/ML IJ SOLN
4.0000 mg | INTRAMUSCULAR | Status: DC | PRN
  Administered 2012-02-06: 4 mg via INTRAVENOUS
  Filled 2012-02-06: qty 1

## 2012-02-06 MED ORDER — ONDANSETRON HCL 4 MG/2ML IJ SOLN
4.0000 mg | Freq: Once | INTRAMUSCULAR | Status: AC
  Administered 2012-02-06: 4 mg via INTRAVENOUS

## 2012-02-06 MED ORDER — ACETAMINOPHEN 500 MG PO TABS
1000.0000 mg | ORAL_TABLET | Freq: Once | ORAL | Status: AC
  Administered 2012-02-06: 1000 mg via ORAL
  Filled 2012-02-06: qty 2

## 2012-02-06 MED ORDER — ONDANSETRON HCL 4 MG PO TABS: 4.0000 mg | ORAL_TABLET | Freq: Four times a day (QID) | ORAL | Status: DC | PRN

## 2012-02-06 MED ORDER — ALBUTEROL SULFATE (5 MG/ML) 0.5% IN NEBU: 2.5000 mg | INHALATION_SOLUTION | RESPIRATORY_TRACT | Status: DC | PRN

## 2012-02-06 MED ORDER — ONDANSETRON HCL 4 MG/2ML IJ SOLN
4.0000 mg | Freq: Four times a day (QID) | INTRAMUSCULAR | Status: DC | PRN
  Administered 2012-02-06: 4 mg via INTRAVENOUS
  Filled 2012-02-06: qty 2

## 2012-02-06 MED ORDER — DEXTROSE 5 % IV SOLN
1.0000 g | INTRAVENOUS | Status: DC
  Administered 2012-02-06 – 2012-02-07 (×2): 1 g via INTRAVENOUS
  Filled 2012-02-06 (×2): qty 10

## 2012-02-06 MED ORDER — MORPHINE SULFATE 2 MG/ML IJ SOLN
2.0000 mg | INTRAMUSCULAR | Status: DC | PRN
  Administered 2012-02-06 – 2012-02-07 (×2): 2 mg via INTRAVENOUS
  Filled 2012-02-06 (×2): qty 1

## 2012-02-06 MED ORDER — DEXTROSE 5 % IV SOLN
500.0000 mg | INTRAVENOUS | Status: DC
  Administered 2012-02-07 (×2): 500 mg via INTRAVENOUS
  Filled 2012-02-06 (×2): qty 500

## 2012-02-06 MED ORDER — ACETAMINOPHEN 325 MG PO TABS
650.0000 mg | ORAL_TABLET | Freq: Four times a day (QID) | ORAL | Status: DC | PRN
  Administered 2012-02-06 – 2012-02-07 (×2): 650 mg via ORAL
  Filled 2012-02-06 (×2): qty 2

## 2012-02-06 MED ORDER — DOCUSATE SODIUM 100 MG PO CAPS
100.0000 mg | ORAL_CAPSULE | Freq: Two times a day (BID) | ORAL | Status: DC
  Administered 2012-02-06 – 2012-02-08 (×4): 100 mg via ORAL
  Filled 2012-02-06 (×6): qty 1

## 2012-02-06 MED ORDER — ALBUTEROL SULFATE (5 MG/ML) 0.5% IN NEBU
2.5000 mg | INHALATION_SOLUTION | Freq: Four times a day (QID) | RESPIRATORY_TRACT | Status: DC
  Administered 2012-02-06 – 2012-02-08 (×8): 2.5 mg via RESPIRATORY_TRACT
  Filled 2012-02-06 (×9): qty 0.5

## 2012-02-06 MED ORDER — ASPIRIN 81 MG PO CHEW
81.0000 mg | CHEWABLE_TABLET | Freq: Every day | ORAL | Status: DC
  Administered 2012-02-06: 81 mg via ORAL
  Filled 2012-02-06: qty 1

## 2012-02-06 MED ORDER — ONDANSETRON HCL 4 MG/2ML IJ SOLN
INTRAMUSCULAR | Status: AC
  Administered 2012-02-06: 4 mg via INTRAVENOUS
  Filled 2012-02-06: qty 2

## 2012-02-06 MED ORDER — PANTOPRAZOLE SODIUM 40 MG PO TBEC
40.0000 mg | DELAYED_RELEASE_TABLET | Freq: Every day | ORAL | Status: DC
  Administered 2012-02-07 – 2012-02-08 (×2): 40 mg via ORAL
  Filled 2012-02-06 (×2): qty 1

## 2012-02-06 MED ORDER — ASPIRIN 81 MG PO TABS: 81.0000 mg | ORAL_TABLET | Freq: Every day | ORAL | Status: DC

## 2012-02-06 MED ORDER — DEXTROSE-NACL 5-0.9 % IV SOLN
INTRAVENOUS | Status: DC
  Administered 2012-02-06 – 2012-02-07 (×2): via INTRAVENOUS

## 2012-02-06 MED ORDER — ENOXAPARIN SODIUM 40 MG/0.4ML ~~LOC~~ SOLN
40.0000 mg | SUBCUTANEOUS | Status: DC
  Administered 2012-02-06: 40 mg via SUBCUTANEOUS
  Filled 2012-02-06: qty 0.4

## 2012-02-06 NOTE — H&P (Signed)
[facility] History and Physical  Adrian Miller ZOX:[id number] DOB: [birth year]    PCP:   Oliver Barre, MD   Chief Complaint: fever, chills, and cough.  HPI: Adrian Miller is an 71 y.o. male with hx of COPD, prior heavy smoker, HTN, chronic LBP, presents to [facility] complaining of 4 days history of fever and diaphoresis, with a nonproductive cough and increased SOB.  He had a CXR there which didn't show any infiltrate.  He was also having black stool, but FOB was negative.  He had Leukocytosis with WBC of 20K and he was referred [facility] to [facility].  Reevaluation with CXR showed possible infiltrates bibisilarly.  His Na is 133, with Cr of 1.39.  He had a temperature of 102 in the ER.  Hospitalist was asked to admit him for CAP.  Rewiew of Systems:  Constitutional: Negative for malaise. No significant weight loss or weight gain Eyes: Negative for eye pain, redness and discharge, diplopia, visual changes, or flashes of light. ENMT: Negative for ear pain, hoarseness, nasal congestion, sinus pressure and sore throat. No headaches; tinnitus, drooling, or problem swallowing. Cardiovascular: Negative for chest pain, palpitations, diaphoresis,  and peripheral edema. ; No orthopnea, PND Respiratory: Negative for hemoptysis, wheezing and stridor. No pleuritic chestpain. Gastrointestinal: Negative for nausea, vomiting, diarrhea, constipation, abdominal pain, melena, blood in stool, hematemesis, jaundice and rectal bleeding.    Genitourinary: Negative for frequency, dysuria, incontinence,flank pain and hematuria; Musculoskeletal: Negative for back pain and neck pain. Negative for swelling and trauma.;  Skin: . Negative for pruritus, rash, abrasions, bruising and skin lesion.; ulcerations Neuro: Negative for headache, lightheadedness and neck stiffness. Negative for weakness, altered level of consciousness , altered mental status, extremity weakness, burning feet, involuntary movement, seizure and  syncope.  Psych: negative for anxiety, depression, insomnia, tearfulness, panic attacks, hallucinations, paranoia, suicidal or homicidal ideation    Past Medical History  Diagnosis Date  . Chronic headaches   . Arthritis   . Lumbar disc disease   . Hypertension   . Meningitis 10/2010    related to molart abscess   . COPD (chronic obstructive pulmonary disease)   . Hyperlipidemia   . Chronic LBP   . BPH (benign prostatic hypertrophy)     History reviewed. No pertinent past surgical history.  Medications:  HOME MEDS: Prior to Admission medications   Medication Sig Start Date End Date Taking? Authorizing Provider  aspirin 81 MG tablet Take 81 mg by mouth daily.     Yes Historical Provider, MD  oxyCODONE-acetaminophen (PERCOCET) 5-325 MG per tablet Take 1-2 tablets by mouth every 6 (six) hours as needed for pain. 07/16/11 07/26/11  Juliet Rude. Rubin Payor, MD     Allergies:  No Known Allergies  Social History:   reports that he has quit smoking. He does not have any smokeless tobacco history on file. He reports that he does not drink alcohol or use illicit drugs.  Family History: History reviewed. No pertinent family history.   Physical Exam: Filed Vitals:   02/06/12 0130 02/06/12 0200 02/06/12 0225 02/06/12 0300  BP: 117/54 168/64  156/65  Pulse:      Temp:   102.4 F (39.1 C)   TempSrc:      Resp:      SpO2:       Blood pressure 156/65, pulse 83, temperature 102.4 F (39.1 C), temperature source Oral, resp. rate 16, SpO2 97.00%.  GEN:  Pleasant  patient lying in the stretcher in no acute  distress; cooperative with exam. PSYCH:  alert and oriented x4; does not appear anxious or depressed; affect is appropriate. HEENT: Mucous membranes pink and anicteric; PERRLA; EOM intact; no cervical lymphadenopathy nor thyromegaly or carotid bruit; no JVD; There were no stridor. Neck is very supple. Breasts:: Not examined CHEST WALL: No tenderness CHEST: crackles on his right base,  with no wheezing. HEART: Regular rate and rhythm.  There are no murmur, rub, or gallops.   BACK: No kyphosis or scoliosis; no CVA tenderness ABDOMEN: soft and non-tender; no masses, no organomegaly, normal abdominal bowel sounds; no pannus; no intertriginous candida. There is no rebound and no distention. Rectal Exam: Not done EXTREMITIES: No bone or joint deformity; age-appropriate arthropathy of the hands and knees; no edema; no ulcerations.  There is no calf tenderness. Genitalia: not examined PULSES: 2+ and symmetric SKIN: Normal hydration no rash or ulceration CNS: Cranial nerves 2-12 grossly intact no focal lateralizing neurologic deficit.  Speech is fluent; uvula elevated with phonation, facial symmetry and tongue midline. DTR are normal bilaterally, cerebella exam is intact, barbinski is negative and strengths are equaled bilaterally.  No sensory loss.   Labs on Admission:  Basic Metabolic Panel:  Lab 02/05/12 1610  NA 133*  K 3.6  CL 97  CO2 24  GLUCOSE 136*  BUN 21  CREATININE 1.39*  CALCIUM 9.0  MG --  PHOS --   Liver Function Tests:  Lab 02/05/12 2213  AST 33  ALT 31  ALKPHOS 100  BILITOT 0.8  PROT 7.7  ALBUMIN 3.7   No results found for this basename: LIPASE:5,AMYLASE:5 in the last 168 hours No results found for this basename: AMMONIA:5 in the last 168 hours CBC:  Lab 02/05/12 2213 02/05/12 1951  WBC 16.8* 20.2*  NEUTROABS 14.7* --  HGB 16.3 17.7  HCT 45.6 54.5*  MCV 87.4 94.2  PLT 232 --   Cardiac Enzymes: No results found for this basename: CKTOTAL:5,CKMB:5,CKMBINDEX:5,TROPONINI:5 in the last 168 hours  CBG: No results found for this basename: GLUCAP:5 in the last 168 hours   Radiological Exams on Admission: Dg Chest 2 View  02/05/2012  *RADIOLOGY REPORT*  Clinical Data: Fever, shortness of breath.  CHEST - 2 VIEW  Comparison: 10/29/2010 CT  Findings: Mild bilateral lung base opacity.  Cardiomediastinal contours unchanged.  Mild left  hemidiaphragm elevation. No pleural effusion or pneumothorax.  No acute osseous finding. Glenohumeral degenerative changes.  IMPRESSION: Mild bibasilar opacities; atelectasis versus pneumonia.   Original Report Authenticated By: Waneta Martins, M.D.    Dg Chest 2 View  02/05/2012  *RADIOLOGY REPORT*  Clinical Data: Vomiting.  Cough and fever.  CHEST - 2 VIEW  Comparison: None.  Findings: The cardiac silhouette, mediastinal and hilar contours are within normal limits for age.  There is mild tortuosity and ectasia of the thoracic aorta.  The lungs are clear.  No pleural effusion.  The bony thorax is intact.  Degenerative changes noted in the mid thoracic spine.  IMPRESSION: No acute cardiopulmonary findings.   Original Report Authenticated By: P. Loralie Champagne, M.D.      Assessment/Plan Present on Admission:  .PNA (pneumonia) .COPD (chronic obstructive pulmonary disease) .Chronic LBP .Hypertension  PLAN:  Will admit him for CAP given his advanced age, significant leukocytosis, and high fever.  Will Tx with Rocephin and Zithromax.  He will be given pain meds for his LBP.  He is very stable, full code, and will be admitted to [facility] service.  I have confirmed that he is  a full code.  Other plans as per orders.  Code Status: FULL Unk Lightning, MD. [facility] Pager [phone] 7pm to 7am.  02/06/2012, 5:06 AM

## 2012-02-06 NOTE — Progress Notes (Signed)
[facility] PROGRESS NOTE  LOC FEINSTEIN ZOX:[id number] DOB: 05-Sep-1940 DOA: 02/05/2012 PCP: Oliver Barre, MD  Assessment/Plan: 1. CAP--improving, continue abx. Add IS. Out of bed during day. 2. N/v/diarrhea--none since admission, Hgb normal. Significance of RN note "emesis, tarry and black [stool]" unclear. Patient does not report. Check stool studies, follow CBC. Does not appear clinically significant at this point. Hold ASA, Lovenox for now. 3. Renal insufficiency--acute vs. Chronic, repeat BMP in AM. 4. Chronic low back pain--stable.  Follow-up legionella  Code Status: Full code Family Communication: discussed with daughter at bedside Disposition Plan: home when improved  Brendia Sacks, MD  [facility] Team 6 Pager [phone]. If 8PM-8AM, please contact night-coverage at [email or web address], password [facility] 02/06/2012, 12:09 PM  LOS: 1 day   Brief narrative: 71 year old man presented with fever, cough, n/v/d, black stool (FOBT negative). Admitted for pneumonia.  Consultants:    Procedures:    Antibiotics:  Azithromycin 10/1 >>  Rocephin 10/1 >>  HPI/Subjective: Fever to 102.9. BP, HR, oxygenation, RR stable. Feels better, breathing better. No n/v/diarrhea since admission.  Objective: Filed Vitals:   02/06/12 0630 02/06/12 0817 02/06/12 0823 02/06/12 0920  BP: 156/62 161/69    Pulse: 71 68    Temp:  98.5 F (36.9 C)    TempSrc:  Oral    Resp:  18    SpO2: 94% 98% 95% 95%   No intake or output data in the 24 hours ending 02/06/12 1209 There were no vitals filed for this visit.  Exam:  General:  Appears calm and comfortable Cardiovascular: RRR, no m/r/g. No LE edema. Respiratory: CTA bilaterally, no w/r/r. Normal respiratory effort. Abdomen: soft, ntnd Musculoskeletal: grossly normal tone BUE/BLE Psychiatric: grossly normal mood and affect, speech fluent and appropriate  Data Reviewed: Basic Metabolic Panel:  Lab 02/05/12 8295  NA 133*  K 3.6    CL 97  CO2 24  GLUCOSE 136*  BUN 21  CREATININE 1.39*  CALCIUM 9.0  MG --  PHOS --   Liver Function Tests:  Lab 02/05/12 2213  AST 33  ALT 31  ALKPHOS 100  BILITOT 0.8  PROT 7.7  ALBUMIN 3.7   CBC:  Lab 02/05/12 2213 02/05/12 1951  WBC 16.8* 20.2*  NEUTROABS 14.7* --  HGB 16.3 17.7  HCT 45.6 54.5*  MCV 87.4 94.2  PLT 232 --   Studies: Dg Chest 2 View  02/05/2012  *RADIOLOGY REPORT*  Clinical Data: Fever, shortness of breath.  CHEST - 2 VIEW  Comparison: 10/29/2010 CT  Findings: Mild bilateral lung base opacity.  Cardiomediastinal contours unchanged.  Mild left hemidiaphragm elevation. No pleural effusion or pneumothorax.  No acute osseous finding. Glenohumeral degenerative changes.  IMPRESSION: Mild bibasilar opacities; atelectasis versus pneumonia.   Original Report Authenticated By: Waneta Martins, M.D.    Dg Chest 2 View  02/05/2012  *RADIOLOGY REPORT*  Clinical Data: Vomiting.  Cough and fever.  CHEST - 2 VIEW  Comparison: None.  Findings: The cardiac silhouette, mediastinal and hilar contours are within normal limits for age.  There is mild tortuosity and ectasia of the thoracic aorta.  The lungs are clear.  No pleural effusion.  The bony thorax is intact.  Degenerative changes noted in the mid thoracic spine.  IMPRESSION: No acute cardiopulmonary findings.   Original Report Authenticated By: P. Loralie Champagne, M.D.     Scheduled Meds:   . acetaminophen  1,000 mg Oral Once  . albuterol  2.5 mg Nebulization Q6H  . aspirin  81 mg  Oral Daily  . azithromycin  500 mg Intravenous Once  . azithromycin  500 mg Intravenous Q24H  . cefTRIAXone (ROCEPHIN)  IV  1 g Intravenous Once  . cefTRIAXone (ROCEPHIN)  IV  1 g Intravenous Q24H  . docusate sodium  100 mg Oral BID  . enoxaparin (LOVENOX) injection  40 mg Subcutaneous Q24H  . influenza  inactive virus vaccine  0.5 mL Intramuscular Tomorrow-1000  .  morphine injection  4 mg Intravenous Once  . ondansetron (ZOFRAN)  IV  4 mg Intravenous Once  . ondansetron (ZOFRAN) IV  4 mg Intravenous Once  . sodium chloride  1,000 mL Intravenous Once  . DISCONTD: aspirin EC  81 mg Oral Daily  . DISCONTD: aspirin  81 mg Oral Daily   Continuous Infusions:   . dextrose 5 % and 0.9% NaCl 100 mL/hr at 02/06/12 0949    Principal Problem:  *PNA (pneumonia) Active Problems:  Hypertension  Chronic LBP  Renal insufficiency     Brendia Sacks, MD  [facility] Team 6 Pager [phone]. If 8PM-8AM, please contact night-coverage at [email or web address], password [facility] 02/06/2012, 12:09 PM  LOS: 1 day   Time spent: 25 minutes

## 2012-02-06 NOTE — Telephone Encounter (Signed)
Agree with [place] for fever, bloody diarrhea, black stools

## 2012-02-06 NOTE — ED Provider Notes (Signed)
Patient presented with nausea vomiting cough with associated fever. On exam the patient had vital signs that reflected a fever but no tachycardia or significant hypoxia, moist mucous membranes, clear lungs on my exam, abdomen which is soft, slight increased bowel sounds no focal abdominal tenderness to palpation. The chest x-ray showed possible lower lobe acute infiltrate, otherwise patient had no hypotension. Due to ongoing nausea with fever patient will be admitted to the hospital.  Medical screening examination/treatment/procedure(s) were conducted as a shared visit with non-physician practitioner(s) and myself.  I personally evaluated the patient during the encounter    Vida Roller, MD 02/06/12 [phone]

## 2012-02-07 LAB — BASIC METABOLIC PANEL
BUN: 12 mg/dL (ref 6–23)
CO2: 24 mEq/L (ref 19–32)
Calcium: 8.4 mg/dL (ref 8.4–10.5)
Chloride: 99 mEq/L (ref 96–112)
Creatinine, Ser: 1.05 mg/dL (ref 0.50–1.35)
GFR calc Af Amer: 81 mL/min — ABNORMAL LOW (ref >90)
GFR calc non Af Amer: 70 mL/min — ABNORMAL LOW (ref >90)
Glucose, Bld: 155 mg/dL — ABNORMAL HIGH (ref 70–99)
Potassium: 3.4 mEq/L — ABNORMAL LOW (ref 3.5–5.1)
Sodium: 134 mEq/L — ABNORMAL LOW (ref 135–145)

## 2012-02-07 LAB — CBC
HCT: 41.1 % (ref 39.0–52.0)
Hemoglobin: 14.1 g/dL (ref 13.0–17.0)
MCH: 30.3 pg (ref 26.0–34.0)
MCHC: 34.3 g/dL (ref 30.0–36.0)
MCV: 88.2 fL (ref 78.0–100.0)
Platelets: 180 10*3/uL (ref 150–400)
RBC: 4.66 MIL/uL (ref 4.22–5.81)
RDW: 15.5 % (ref 11.5–15.5)
WBC: 8.6 10*3/uL (ref 4.0–10.5)

## 2012-02-07 LAB — LEGIONELLA ANTIGEN, URINE: Legionella Antigen, Urine: NEGATIVE

## 2012-02-07 MED ORDER — OXYCODONE-ACETAMINOPHEN 5-325 MG PO TABS
1.0000 | ORAL_TABLET | Freq: Four times a day (QID) | ORAL | Status: DC | PRN
  Administered 2012-02-07: 1 via ORAL
  Administered 2012-02-08: 2 via ORAL
  Filled 2012-02-07: qty 2
  Filled 2012-02-07: qty 1

## 2012-02-07 MED ORDER — POTASSIUM CHLORIDE CRYS ER 20 MEQ PO TBCR
40.0000 meq | EXTENDED_RELEASE_TABLET | Freq: Two times a day (BID) | ORAL | Status: AC
  Administered 2012-02-07 (×2): 40 meq via ORAL
  Filled 2012-02-07 (×2): qty 2

## 2012-02-07 MED ORDER — ASPIRIN EC 81 MG PO TBEC
81.0000 mg | DELAYED_RELEASE_TABLET | Freq: Every day | ORAL | Status: DC
  Administered 2012-02-07 – 2012-02-08 (×2): 81 mg via ORAL
  Filled 2012-02-07 (×2): qty 1

## 2012-02-07 MED ORDER — ENOXAPARIN SODIUM 40 MG/0.4ML ~~LOC~~ SOLN
40.0000 mg | SUBCUTANEOUS | Status: DC
  Administered 2012-02-07: 40 mg via SUBCUTANEOUS
  Filled 2012-02-07 (×2): qty 0.4

## 2012-02-07 NOTE — Progress Notes (Signed)
[facility] PROGRESS NOTE  Adrian Miller WUJ:[id number] DOB: 1940/07/05 DOA: 02/05/2012 PCP: Oliver Barre, MD  Assessment/Plan: 1. CAP--febrile last night, but overall improving, no hypoxia; leukocytosis resolved. Continue antibiotics. Out of bed during day. Legionella negative. 2. N/v/diarrhea--none since admission, Hgb normal (decrease likely dilutional). Significance of RN note prior to admission "emesis, tarry and black [stool]" unclear. Check stool studies (no BM while inpatient). Follow CBC in AM. Resume ASA, Lovenox. 3. Acute renal failure--resolved. 4. Chronic low back pain--stable.  5. Severe malnutrition of chronic illness  Code Status: Full code Family Communication: discussed with daughter at bedside 10/3 Disposition Plan: home 10/4 if better  Brendia Sacks, MD  [facility] Team 6 Pager [phone]. If 8PM-8AM, please contact night-coverage at [email or web address], password [facility] 02/07/2012, 3:25 PM  LOS: 2 days   Brief narrative: 71 year old man presented with fever, cough, n/v/d, black stool (FOBT negative). Admitted for pneumonia.  Consultants:    Procedures:    Antibiotics:  Azithromycin 10/1 >>  Rocephin 10/1 >>  HPI/Subjective: Feels better, no vomiting or diarrhea. Breathing fine. Wants to go home.  Objective: Filed Vitals:   02/07/12 0912 02/07/12 1200 02/07/12 1230 02/07/12 1346  BP:  168/90 134/74 163/79  Pulse:  62  60  Temp:  98.4 F (36.9 C)  99.2 F (37.3 C)  TempSrc:  Oral  Oral  Resp:  16  20  Height:      Weight:      SpO2: 95% 97%  97%    Intake/Output Summary (Last 24 hours) at 02/07/12 1525 Last data filed at 02/07/12 1418  Gross per 24 hour  Intake   1048 ml  Output    900 ml  Net    148 ml   Filed Weights   02/06/12 1816  Weight: 95.255 kg (210 lb)    Exam:  General:  Appears calm and comfortable Cardiovascular: RRR, no m/r/g. No LE edema. Respiratory: CTA bilaterally, no w/r/r. Normal respiratory  effort. Psychiatric: grossly normal mood and affect, speech fluent and appropriate  Data Reviewed: Basic Metabolic Panel:  Lab 02/07/12 8657 02/05/12 2213  NA 134* 133*  K 3.4* 3.6  CL 99 97  CO2 24 24  GLUCOSE 155* 136*  BUN 12 21  CREATININE 1.05 1.39*  CALCIUM 8.4 9.0  MG -- --  PHOS -- --   Liver Function Tests:  Lab 02/05/12 2213  AST 33  ALT 31  ALKPHOS 100  BILITOT 0.8  PROT 7.7  ALBUMIN 3.7   CBC:  Lab 02/07/12 0500 02/05/12 2213 02/05/12 1951  WBC 8.6 16.8* 20.2*  NEUTROABS -- 14.7* --  HGB 14.1 16.3 17.7  HCT 41.1 45.6 54.5*  MCV 88.2 87.4 94.2  PLT 180 232 --   Studies: Dg Chest 2 View  02/05/2012  *RADIOLOGY REPORT*  Clinical Data: Fever, shortness of breath.  CHEST - 2 VIEW  Comparison: 10/29/2010 CT  Findings: Mild bilateral lung base opacity.  Cardiomediastinal contours unchanged.  Mild left hemidiaphragm elevation. No pleural effusion or pneumothorax.  No acute osseous finding. Glenohumeral degenerative changes.  IMPRESSION: Mild bibasilar opacities; atelectasis versus pneumonia.   Original Report Authenticated By: Waneta Martins, M.D.    Dg Chest 2 View  02/05/2012  *RADIOLOGY REPORT*  Clinical Data: Vomiting.  Cough and fever.  CHEST - 2 VIEW  Comparison: None.  Findings: The cardiac silhouette, mediastinal and hilar contours are within normal limits for age.  There is mild tortuosity and ectasia of the thoracic aorta.  The lungs  are clear.  No pleural effusion.  The bony thorax is intact.  Degenerative changes noted in the mid thoracic spine.  IMPRESSION: No acute cardiopulmonary findings.   Original Report Authenticated By: P. Loralie Champagne, M.D.     Scheduled Meds:    . albuterol  2.5 mg Nebulization Q6H  . azithromycin  500 mg Intravenous Q24H  . cefTRIAXone (ROCEPHIN)  IV  1 g Intravenous Q24H  . docusate sodium  100 mg Oral BID  . influenza  inactive virus vaccine  0.5 mL Intramuscular Tomorrow-1000  . pantoprazole  40 mg Oral Daily   . DISCONTD: aspirin  81 mg Oral Daily  . DISCONTD: enoxaparin (LOVENOX) injection  40 mg Subcutaneous Q24H   Continuous Infusions:    . dextrose 5 % and 0.9% NaCl 100 mL/hr at 02/07/12 0724    Principal Problem:  *PNA (pneumonia) Active Problems:  Hypertension  Chronic LBP  Renal insufficiency     Brendia Sacks, MD  [facility] Team 6 Pager [phone]. If 8PM-8AM, please contact night-coverage at [email or web address], password [facility] 02/07/2012, 3:25 PM  LOS: 2 days   Time spent: 25 minutes

## 2012-02-07 NOTE — Progress Notes (Signed)
INITIAL ADULT NUTRITION ASSESSMENT Date: 02/07/2012   Time: 12:54 PM Reason for Assessment: Nutrition risk   INTERVENTION: Diet advancement per MD. Will monitor.   Pt meets criteria for severe malnutrition of chronic illness AEB likely <75% estimated energy intake for the past month with 7.8% weight loss in the past 2 months per pt report.   ASSESSMENT: Male 71 y.o.  Dx: PNA (pneumonia)  Food/Nutrition Related Hx: Pt reports poor intake since Thursday r/t nausea/vomiting/diarrhea. Pt states when he as feeling better before then he would eat whenever he wanted to - no specific meal pattern. Pt states since he got sick he has lost his sense of taste and that nothing tastes good. Pt reports 18 pound unintended weight loss in the past 2 months. Pt denies any nausea/vomiting today and said he has been tolerating the clear liquid diet.   Hx:  Past Medical History  Diagnosis Date  . Chronic headaches   . Arthritis   . Lumbar disc disease   . Hypertension   . Meningitis 10/2010    related to molart abscess   . COPD (chronic obstructive pulmonary disease)   . Hyperlipidemia   . Chronic LBP   . BPH (benign prostatic hypertrophy)     Related Meds:  Scheduled Meds:   . albuterol  2.5 mg Nebulization Q6H  . azithromycin  500 mg Intravenous Q24H  . cefTRIAXone (ROCEPHIN)  IV  1 g Intravenous Q24H  . docusate sodium  100 mg Oral BID  . influenza  inactive virus vaccine  0.5 mL Intramuscular Tomorrow-1000  . pantoprazole  40 mg Oral Daily  . DISCONTD: aspirin  81 mg Oral Daily  . DISCONTD: enoxaparin (LOVENOX) injection  40 mg Subcutaneous Q24H   Continuous Infusions:   . dextrose 5 % and 0.9% NaCl 100 mL/hr at 02/07/12 0724   PRN Meds:.acetaminophen, albuterol, morphine injection, ondansetron (ZOFRAN) IV, ondansetron, DISCONTD:  morphine injection  Ht: 5\' 9"  (175.3 cm)  Wt: 210 lb (95.255 kg)  Ideal Wt: 160 lb % Ideal Wt: 131  Usual Wt: 228 lb per pt report % Usual Wt:  92   Body mass index is 31.01 kg/(m^2). Class I obesity   Labs:  CMP     Component Value Date/Time   NA 134* 02/07/2012 0500   K 3.4* 02/07/2012 0500   CL 99 02/07/2012 0500   CO2 24 02/07/2012 0500   GLUCOSE 155* 02/07/2012 0500   BUN 12 02/07/2012 0500   CREATININE 1.05 02/07/2012 0500   CALCIUM 8.4 02/07/2012 0500   PROT 7.7 02/05/2012 2213   ALBUMIN 3.7 02/05/2012 2213   AST 33 02/05/2012 2213   ALT 31 02/05/2012 2213   ALKPHOS 100 02/05/2012 2213   BILITOT 0.8 02/05/2012 2213   GFRNONAA 70* 02/07/2012 0500   GFRAA 81* 02/07/2012 0500    Intake/Output Summary (Last 24 hours) at 02/07/12 1301 Last data filed at 02/07/12 1100  Gross per 24 hour  Intake      0 ml  Output    600 ml  Net   -600 ml   Last BM - 9/30  Diet Order: Clear Liquid    IVF:    dextrose 5 % and 0.9% NaCl Last Rate: 100 mL/hr at 02/07/12 0724    Estimated Nutritional Needs:   Kcal:1700-2050 Protein:75-90g Fluid:1.7-2L  NUTRITION DIAGNOSIS: -Inadequate oral intake (NI-2.1).  Status: Ongoing  RELATED TO: nausea/vomiting  AS EVIDENCE BY: clear liquid   MONITORING/EVALUATION(Goals): Advance diet as tolerated to regular diet  EDUCATION  NEEDS: -No education needs identified at this time   Dietitian #: [phone]  DOCUMENTATION CODES Per approved criteria  -Severe malnutrition in the context of chronic illness -Obesity Unspecified    Marshall Cork 02/07/2012, 12:54 PM

## 2012-02-08 LAB — BASIC METABOLIC PANEL
BUN: 10 mg/dL (ref 6–23)
CO2: 25 mEq/L (ref 19–32)
Calcium: 8.7 mg/dL (ref 8.4–10.5)
Chloride: 103 mEq/L (ref 96–112)
Creatinine, Ser: 1.13 mg/dL (ref 0.50–1.35)
GFR calc Af Amer: 74 mL/min — ABNORMAL LOW (ref >90)
GFR calc non Af Amer: 64 mL/min — ABNORMAL LOW (ref >90)
Glucose, Bld: 91 mg/dL (ref 70–99)
Potassium: 3.7 mEq/L (ref 3.5–5.1)
Sodium: 138 mEq/L (ref 135–145)

## 2012-02-08 MED ORDER — LEVOFLOXACIN 750 MG PO TABS
750.0000 mg | ORAL_TABLET | Freq: Every day | ORAL | Status: DC
  Administered 2012-02-08: 750 mg via ORAL
  Filled 2012-02-08 (×2): qty 1

## 2012-02-08 MED ORDER — LEVOFLOXACIN 750 MG PO TABS: 750.0000 mg | ORAL_TABLET | Freq: Every day | ORAL | Status: DC

## 2012-02-08 MED ORDER — OXYCODONE-ACETAMINOPHEN 5-325 MG PO TABS: 1.0000 | ORAL_TABLET | Freq: Four times a day (QID) | ORAL | Status: DC | PRN

## 2012-02-08 MED ORDER — ALBUTEROL SULFATE HFA 108 (90 BASE) MCG/ACT IN AERS: 2.0000 | INHALATION_SPRAY | Freq: Four times a day (QID) | RESPIRATORY_TRACT | Status: DC | PRN

## 2012-02-08 NOTE — Discharge Summary (Signed)
Physician Discharge Summary  Adrian Miller YQI:[id number] DOB: [birth year] DOA: 02/05/2012  PCP: Oliver Barre, MD  Admit date: 02/05/2012 Discharge date: 02/08/2012  Recommendations for Outpatient Follow-up:  1. Follow-up resolution of CAP  Follow-up Information    Follow up with Oliver Barre, MD. Schedule an appointment as soon as possible for a visit in 1 week.   Contact information:   [address] [place] [address] [phone]         Discharge Diagnoses:  1. CAP 2. N/v/diarrhea  3. Acute renal failure 4. Chronic low back pain   5. Severe malnutrition of chronic illness  Discharge Condition: improved Disposition: home  Diet recommendation: regular  Filed Weights   02/06/12 1816  Weight: 95.255 kg (210 lb)    History of present illness:  71 year old man presented with fever, cough, n/v/d, black stool (FOBT negative). Admitted for pneumonia.  Hospital Course:  Adrian Miller was placed on empiric antibiotics for CAP. He rapidly improved and will complete antibiotics in the outpatient setting. There was a telephone note made prior to admission of tarry black stool prior to admission and coffee ground emesis, however there was no evidence of this on admission and in fact no emesis or stools during this admission, no evidence of bleeding and Hgb has been normal. The significance of this report is unclear but no further testing is suggested at this point. Acute renal failure resolved with IVF.  1. CAP--afebrile 24 hours, no hypoxia; leukocytosis resolved. Continue antibiotics as outpatient. Legionella negative.  2. N/v/diarrhea--none since admission, Hgb normal (decrease likely dilutional). Significance of RN note prior to admission "emesis, tarry and black [stool]" unclear. Unable to obtain stool studies (no BM while inpatient).   3. Acute renal failure--resolved.  4. Chronic low back pain--stable.   5. Severe malnutrition of chronic  illness  Consultants:  None  Procedures:  None  Antibiotics:  Azithromycin 10/1 >>10/3   Rocephin 10/1 >>10/3   Levaquin 10/4 >> 10/8  Discharge Instructions  Discharge Orders    Future Orders Please Complete By Expires   Diet general      Discharge instructions      Comments:   Be sure to finish all antibiotics. Call physician or seek immediate medical attention for recurrent fever, increased shortness of breath.   Activity as tolerated - No restrictions          Medication List     As of 02/08/2012 12:10 PM    TAKE these medications         albuterol 108 (90 BASE) MCG/ACT inhaler   Commonly known as: PROVENTIL HFA;VENTOLIN HFA   Inhale 2 puffs into the lungs every 6 (six) hours as needed for wheezing or shortness of breath.      aspirin 81 MG tablet   Take 81 mg by mouth daily.      levofloxacin 750 MG tablet   Commonly known as: LEVAQUIN   Take 1 tablet (750 mg total) by mouth daily. Start 10/5 at 1200.      oxyCODONE-acetaminophen 5-325 MG per tablet   Commonly known as: PERCOCET/ROXICET   Take 1 tablet by mouth every 6 (six) hours as needed for pain.        The results of significant diagnostics from this hospitalization (including imaging, microbiology, ancillary and laboratory) are listed below for reference.    Significant Diagnostic Studies: Dg Chest 2 View  02/05/2012  *RADIOLOGY REPORT*  Clinical Data: Fever, shortness of  breath.  CHEST - 2 VIEW  Comparison: 10/29/2010 CT  Findings: Mild bilateral lung base opacity.  Cardiomediastinal contours unchanged.  Mild left hemidiaphragm elevation. No pleural effusion or pneumothorax.  No acute osseous finding. Glenohumeral degenerative changes.  IMPRESSION: Mild bibasilar opacities; atelectasis versus pneumonia.   Original Report Authenticated By: Waneta Martins, M.D.    Dg Chest 2 View  02/05/2012  *RADIOLOGY REPORT*  Clinical Data: Vomiting.  Cough and fever.  CHEST - 2 VIEW  Comparison: None.   Findings: The cardiac silhouette, mediastinal and hilar contours are within normal limits for age.  There is mild tortuosity and ectasia of the thoracic aorta.  The lungs are clear.  No pleural effusion.  The bony thorax is intact.  Degenerative changes noted in the mid thoracic spine.  IMPRESSION: No acute cardiopulmonary findings.   Original Report Authenticated By: P. Loralie Champagne, M.D.    Labs: Basic Metabolic Panel:  Lab 02/08/12 1610 02/07/12 0500 02/05/12 2213  NA 138 134* 133*  K 3.7 3.4* 3.6  CL 103 99 97  CO2 25 24 24   GLUCOSE 91 155* 136*  BUN 10 12 21   CREATININE 1.13 1.05 1.39*  CALCIUM 8.7 8.4 9.0  MG -- -- --  PHOS -- -- --   Liver Function Tests:  Lab 02/05/12 2213  AST 33  ALT 31  ALKPHOS 100  BILITOT 0.8  PROT 7.7  ALBUMIN 3.7   CBC:  Lab 02/07/12 0500 02/05/12 2213 02/05/12 1951  WBC 8.6 16.8* 20.2*  NEUTROABS -- 14.7* --  HGB 14.1 16.3 17.7  HCT 41.1 45.6 54.5*  MCV 88.2 87.4 94.2  PLT 180 232 --    Principal Problem:  *PNA (pneumonia) Active Problems:  Hypertension  Chronic LBP  Renal insufficiency   Time coordinating discharge: 35 minutes  Signed:  Brendia Sacks, MD [facility] 02/08/2012, 12:00 PM

## 2012-02-08 NOTE — Progress Notes (Signed)
Patient's daughter called with multiple questions and worried about father.  She has questions about if patient has complained of any headaches as he frequently complains of this at home.  I told her that he had not complained of any headache to me only pain in the right leg and foot.  She is also concerned about him having mold in his home and if that is causing him problems.  I encouraged her to call [facility] Department to see if his home could be tested.  She also wondered if he needed an inhaler, I told her I would mention this to the MD who will be rounding today.   Daughter requests if she does not see physician that he please call her at [phone] Adrian Miller).  Thanks

## 2012-02-08 NOTE — Progress Notes (Signed)
Patient discharged to home.  Reviewed discharge instructions and prescriptions with patient.  No questions at this time.  Patient escorted to lobby via wheelchair.  Patient discharged.

## 2012-02-08 NOTE — Progress Notes (Signed)
[facility] PROGRESS NOTE  ATSUSHI YOM GNF:[id number] DOB: 1940-11-12 DOA: 02/05/2012 PCP: Oliver Barre, MD  Assessment/Plan: 1. CAP--afebrile 24 hours, no hypoxia; leukocytosis resolved. Continue antibiotics. Out of bed during day. Legionella negative. 2. N/v/diarrhea--none since admission, Hgb normal (decrease likely dilutional). Significance of RN note prior to admission "emesis, tarry and black [stool]" unclear. Unable to obtain stool studies (no BM while inpatient).   3. Acute renal failure--resolved. 4. Chronic low back pain--stable.  5. Severe malnutrition of chronic illness  Code Status: Full code Family Communication: discussed with daughter at bedside 10/3 Disposition Plan: home 10/4 if better  Brendia Sacks, MD  [facility] Team 6 Pager [phone]. If 8PM-8AM, please contact night-coverage at [email or web address], password [facility] 02/08/2012, 11:40 AM  LOS: 3 days   Brief narrative: 71 year old man presented with fever, cough, n/v/d, black stool (FOBT negative). Admitted for pneumonia.  Consultants:    Procedures:    Antibiotics:  Azithromycin 10/1 >>10/3  Rocephin 10/1 >>10/3  Levaquin 10/4 >> 10/8  HPI/Subjective: Feels better, no vomiting or diarrhea. Breathing fine. Wants to go home.  Objective: Filed Vitals:   02/07/12 2230 02/08/12 0126 02/08/12 0630 02/08/12 0732  BP: 170/81  163/78   Pulse: 73  57   Temp: 99.3 F (37.4 C)  98.9 F (37.2 C)   TempSrc: Oral     Resp: 18  18   Height:      Weight:      SpO2: 98% 99% 97% 93%    Intake/Output Summary (Last 24 hours) at 02/08/12 1140 Last data filed at 02/07/12 1818  Gross per 24 hour  Intake    808 ml  Output    700 ml  Net    108 ml   Filed Weights   02/06/12 1816  Weight: 95.255 kg (210 lb)    Exam:  General:  Appears calm and comfortable Cardiovascular: RRR, no m/r/g. No LE edema. Respiratory: CTA bilaterally, no w/r/r. Normal respiratory effort. Psychiatric: grossly normal  mood and affect, speech fluent and appropriate  Data Reviewed: Basic Metabolic Panel:  Lab 02/08/12 5284 02/07/12 0500 02/05/12 2213  NA 138 134* 133*  K 3.7 3.4* 3.6  CL 103 99 97  CO2 25 24 24   GLUCOSE 91 155* 136*  BUN 10 12 21   CREATININE 1.13 1.05 1.39*  CALCIUM 8.7 8.4 9.0  MG -- -- --  PHOS -- -- --   Liver Function Tests:  Lab 02/05/12 2213  AST 33  ALT 31  ALKPHOS 100  BILITOT 0.8  PROT 7.7  ALBUMIN 3.7   CBC:  Lab 02/07/12 0500 02/05/12 2213 02/05/12 1951  WBC 8.6 16.8* 20.2*  NEUTROABS -- 14.7* --  HGB 14.1 16.3 17.7  HCT 41.1 45.6 54.5*  MCV 88.2 87.4 94.2  PLT 180 232 --   Studies: No results found.  Scheduled Meds:    . albuterol  2.5 mg Nebulization Q6H  . aspirin EC  81 mg Oral Daily  . azithromycin  500 mg Intravenous Q24H  . cefTRIAXone (ROCEPHIN)  IV  1 g Intravenous Q24H  . docusate sodium  100 mg Oral BID  . enoxaparin (LOVENOX) injection  40 mg Subcutaneous Q24H  . pantoprazole  40 mg Oral Daily  . potassium chloride  40 mEq Oral BID   Continuous Infusions:    . DISCONTD: dextrose 5 % and 0.9% NaCl 100 mL/hr at 02/07/12 0724    Principal Problem:  *PNA (pneumonia) Active Problems:  Hypertension  Chronic LBP  Renal  insufficiency     Brendia Sacks, MD  [facility] Team 6 Pager [phone]. If 8PM-8AM, please contact night-coverage at [email or web address], password [facility] 02/08/2012, 11:40 AM  LOS: 3 days   Time spent: 25 minutes

## 2012-02-19 ENCOUNTER — Telehealth: Payer: Self-pay | Admitting: Internal Medicine

## 2012-02-19 NOTE — Telephone Encounter (Signed)
Ok with me 

## 2012-02-19 NOTE — Telephone Encounter (Signed)
The patient's daughter called and is hoping to get the patient switched from Dr.John to Dr.Jones for Primary Care.  Please advise if this works.   Pt's daughter Lawson Fiscal) [phone]

## 2012-02-20 NOTE — Telephone Encounter (Signed)
yes

## 2012-02-21 NOTE — Telephone Encounter (Signed)
Pt scheduled with Dr Yetta Barre 02/28/12

## 2012-02-28 ENCOUNTER — Encounter: Payer: Self-pay | Admitting: Internal Medicine

## 2012-02-28 ENCOUNTER — Ambulatory Visit (INDEPENDENT_AMBULATORY_CARE_PROVIDER_SITE_OTHER)
Admission: RE | Admit: 2012-02-28 | Discharge: 2012-02-28 | Disposition: A | Discharge status: Home / Self Care | Payer: Medicare Other | Source: Ambulatory Visit | Attending: Internal Medicine | Admitting: Internal Medicine

## 2012-02-28 ENCOUNTER — Ambulatory Visit (INDEPENDENT_AMBULATORY_CARE_PROVIDER_SITE_OTHER): Payer: Medicare Other | Admitting: Internal Medicine

## 2012-02-28 VITALS — BP 140/70 | HR 64 | Temp 98.3°F | Resp 16 | Wt 219.0 lb

## 2012-02-28 DIAGNOSIS — M169 Osteoarthritis of hip, unspecified: Secondary | ICD-10-CM

## 2012-02-28 DIAGNOSIS — J189 Pneumonia, unspecified organism: Secondary | ICD-10-CM

## 2012-02-28 DIAGNOSIS — J449 Chronic obstructive pulmonary disease, unspecified: Secondary | ICD-10-CM

## 2012-02-28 DIAGNOSIS — I10 Essential (primary) hypertension: Secondary | ICD-10-CM

## 2012-02-28 MED ORDER — MOMETASONE FURO-FORMOTEROL FUM 100-5 MCG/ACT IN AERO: 2.0000 | INHALATION_SPRAY | Freq: Two times a day (BID) | RESPIRATORY_TRACT | Status: DC

## 2012-02-28 MED ORDER — METHYLPREDNISOLONE ACETATE 80 MG/ML IJ SUSP
120.0000 mg | Freq: Once | INTRAMUSCULAR | Status: AC
  Administered 2012-02-28: 120 mg via INTRAMUSCULAR

## 2012-02-28 MED ORDER — HYDROCODONE-ACETAMINOPHEN 10-325 MG PO TABS: 1.0000 | ORAL_TABLET | Freq: Three times a day (TID) | ORAL | Status: DC | PRN

## 2012-02-28 NOTE — Progress Notes (Signed)
Subjective:    Patient ID: Adrian Miller, male    DOB: November 16, 1940, 71 y.o.   MRN: [id number]  Cough This is a recurrent problem. The current episode started 1 to 4 weeks ago. The problem has been gradually improving. The problem occurs every few hours. The cough is non-productive. Pertinent negatives include no chest pain, chills, ear congestion, ear pain, fever, headaches, heartburn, hemoptysis, myalgias, nasal congestion, postnasal drip, rash, rhinorrhea, sore throat, shortness of breath, sweats, weight loss or wheezing. He has tried a beta-agonist inhaler for the symptoms. The treatment provided moderate relief. His past medical history is significant for COPD and pneumonia. There is no history of asthma, bronchiectasis, emphysema or environmental allergies.      Review of Systems  Constitutional: Negative for fever, chills, weight loss, diaphoresis, activity change, appetite change, fatigue and unexpected weight change.  HENT: Negative for ear pain, sore throat, rhinorrhea and postnasal drip.   Eyes: Negative.   Respiratory: Positive for cough. Negative for apnea, hemoptysis, choking, chest tightness, shortness of breath, wheezing and stridor.   Cardiovascular: Negative for chest pain.  Gastrointestinal: Negative for heartburn, nausea, vomiting, abdominal pain, diarrhea, constipation, blood in stool and anal bleeding.  Genitourinary: Negative.   Musculoskeletal: Positive for arthralgias (hips). Negative for myalgias, back pain, joint swelling and gait problem.  Skin: Negative for color change, pallor, rash and wound.  Neurological: Negative for dizziness, tremors, seizures, syncope, facial asymmetry, speech difficulty, weakness, light-headedness, numbness and headaches.  Hematological: Negative for environmental allergies and adenopathy. Does not bruise/bleed easily.  Psychiatric/Behavioral: Negative.        Objective:   Physical Exam  Vitals reviewed. Constitutional: He is oriented  to person, place, and time. He appears well-developed and well-nourished. No distress.  HENT:  Head: Normocephalic and atraumatic.  Mouth/Throat: Oropharynx is clear and moist. No oropharyngeal exudate.  Eyes: Conjunctivae normal are normal. Right eye exhibits no discharge. Left eye exhibits no discharge. No scleral icterus.  Neck: Normal range of motion. Neck supple. No JVD present. No tracheal deviation present. No thyromegaly present.  Cardiovascular: Normal rate, regular rhythm, normal heart sounds and intact distal pulses.  Exam reveals no gallop and no friction rub.   No murmur heard. Pulmonary/Chest: Effort normal and breath sounds normal. No stridor. No respiratory distress. He has no wheezes. He has no rales. He exhibits no tenderness.  Abdominal: Soft. Bowel sounds are normal. He exhibits no distension and no mass. There is no tenderness. There is no rebound and no guarding.  Musculoskeletal: Normal range of motion. He exhibits no edema and no tenderness.  Lymphadenopathy:    He has no cervical adenopathy.  Neurological: He is oriented to person, place, and time.  Skin: Skin is warm and dry. No rash noted. He is not diaphoretic. No erythema. No pallor.  Psychiatric: He has a normal mood and affect. His behavior is normal. Judgment and thought content normal.      Lab Results  Component Value Date   WBC 8.6 02/07/2012   HGB 14.1 02/07/2012   HCT 41.1 02/07/2012   PLT 180 02/07/2012   GLUCOSE 91 02/08/2012   CHOL 94 11/04/2010   TRIG 78 11/04/2010   HDL 30* 11/04/2010   LDLCALC 48 11/04/2010   ALT 31 02/05/2012   AST 33 02/05/2012   NA 138 02/08/2012   K 3.7 02/08/2012   CL 103 02/08/2012   CREATININE 1.13 02/08/2012   BUN 10 02/08/2012   CO2 25 02/08/2012   TSH 0.372 02/05/2012  INR 1.8* 02/11/2008   HGBA1C 6.0* 11/04/2010  Dg Chest 2 View  02/05/2012  *RADIOLOGY REPORT*  Clinical Data: Fever, shortness of breath.  CHEST - 2 VIEW  Comparison: 10/29/2010 CT  Findings: Mild bilateral  lung base opacity.  Cardiomediastinal contours unchanged.  Mild left hemidiaphragm elevation. No pleural effusion or pneumothorax.  No acute osseous finding. Glenohumeral degenerative changes.  IMPRESSION: Mild bibasilar opacities; atelectasis versus pneumonia.   Original Report Authenticated By: Waneta Martins, M.D.      Assessment & Plan:

## 2012-02-28 NOTE — Patient Instructions (Addendum)

## 2012-03-02 ENCOUNTER — Encounter: Payer: Self-pay | Admitting: Internal Medicine

## 2012-03-02 NOTE — Assessment & Plan Note (Signed)
I will recheck his CXR today to see if the PNA has resolved

## 2012-03-02 NOTE — Assessment & Plan Note (Signed)
I have asked him to start [facility]

## 2012-03-02 NOTE — Assessment & Plan Note (Signed)
His BP is well controlled 

## 2012-03-02 NOTE — Assessment & Plan Note (Signed)
Will try an injection of depo-medrol IM for symptoms relief, he will continue taking percocet as needed for pain

## 2012-04-23 ENCOUNTER — Ambulatory Visit: Payer: Medicare Other | Admitting: Internal Medicine

## 2012-04-23 DIAGNOSIS — Z0289 Encounter for other administrative examinations: Secondary | ICD-10-CM

## 2012-09-17 IMAGING — CR DG CHEST 2V
2 series · 2 of 2 positions shown · non-contrast
Comparison: 02/09/2000

CLINICAL DATA: Chest pain, fever, weakness, not eating, history
hypertension, smoking

CHEST - 2 VIEW

[w chest pa]
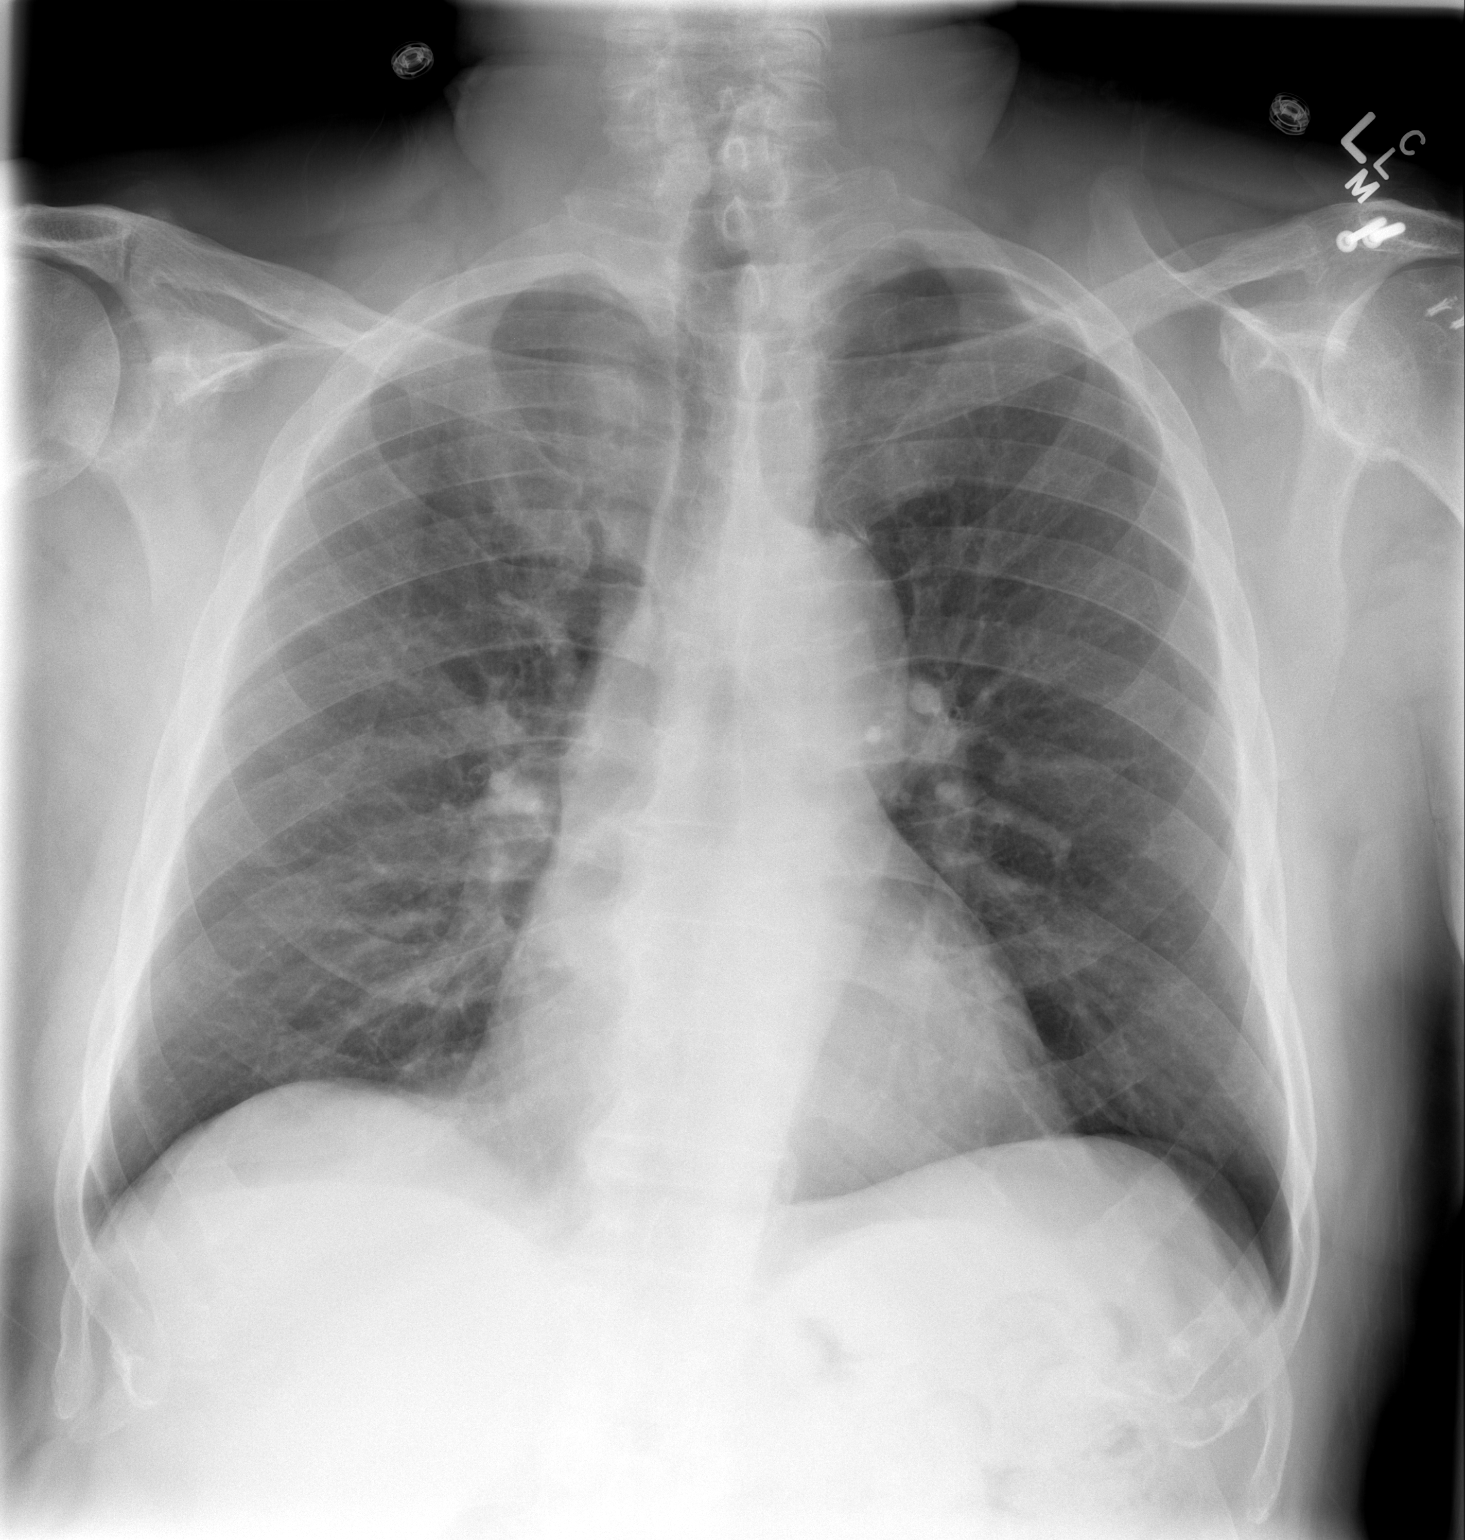

[w chest lat]
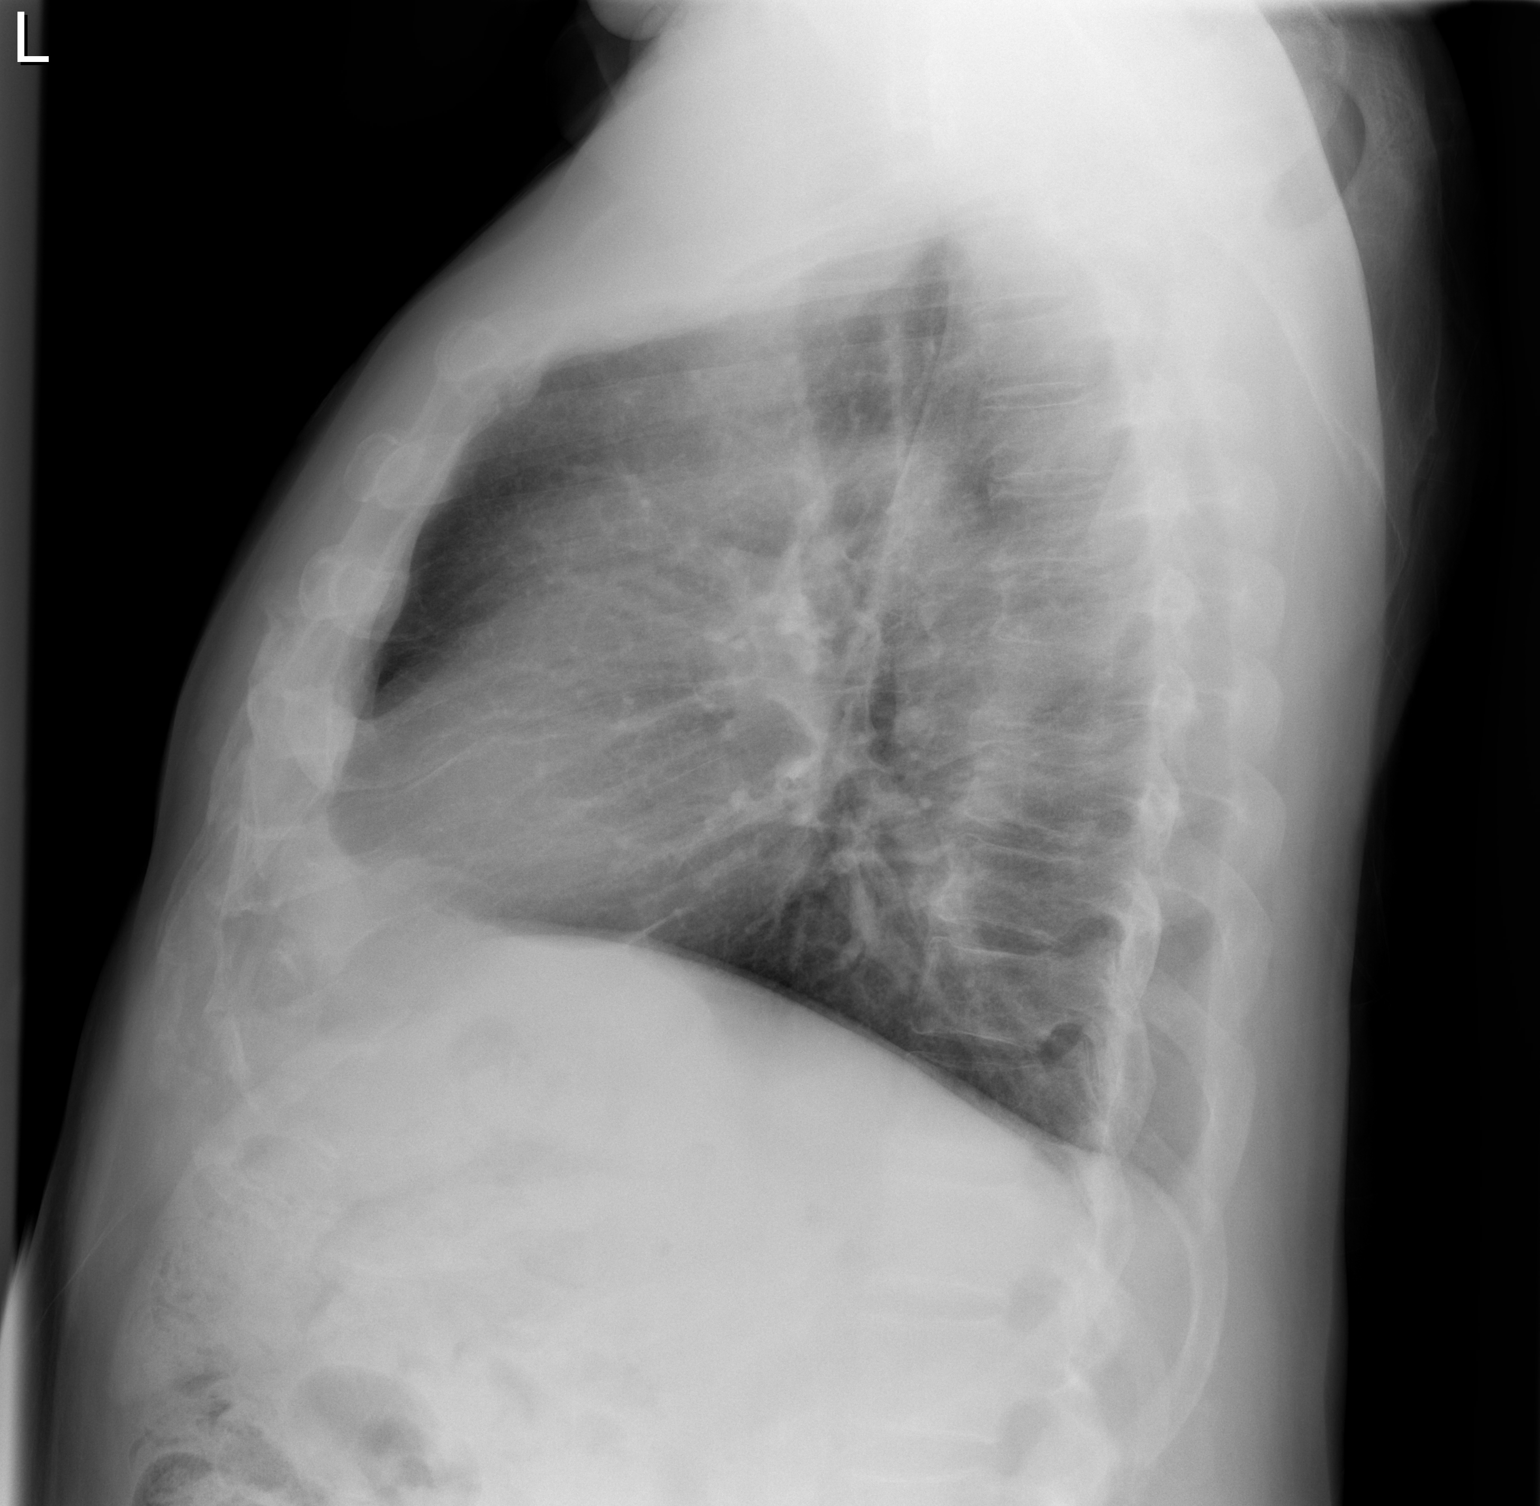

[2 of 2 positions shown; findings below may reference images not displayed]

FINDINGS: Upper-normal size of cardiac silhouette.
Mediastinal contours and pulmonary vascularity normal.
Minimally rotated to the right.
No definite infiltrate, pleural effusion or pneumothorax.
Minimal chronic peribronchial thickening.
Bones unremarkable.
IMPRESSION: Minimal chronic bronchitic changes.
No acute abnormalities.

## 2012-10-29 ENCOUNTER — Emergency Department (HOSPITAL_COMMUNITY): Payer: Medicare Other

## 2012-10-29 ENCOUNTER — Emergency Department (HOSPITAL_COMMUNITY)
Admission: EM | Admit: 2012-10-29 | Discharge: 2012-10-29 | Disposition: A | Discharge status: Home / Self Care | Payer: Medicare Other | Attending: Emergency Medicine | Admitting: Emergency Medicine

## 2012-10-29 ENCOUNTER — Encounter (HOSPITAL_COMMUNITY): Payer: Self-pay | Admitting: *Deleted

## 2012-10-29 DIAGNOSIS — J4489 Other specified chronic obstructive pulmonary disease: Secondary | ICD-10-CM | POA: Insufficient documentation

## 2012-10-29 DIAGNOSIS — I10 Essential (primary) hypertension: Secondary | ICD-10-CM | POA: Insufficient documentation

## 2012-10-29 DIAGNOSIS — Z8739 Personal history of other diseases of the musculoskeletal system and connective tissue: Secondary | ICD-10-CM | POA: Insufficient documentation

## 2012-10-29 DIAGNOSIS — M545 Low back pain, unspecified: Secondary | ICD-10-CM | POA: Insufficient documentation

## 2012-10-29 DIAGNOSIS — M129 Arthropathy, unspecified: Secondary | ICD-10-CM | POA: Insufficient documentation

## 2012-10-29 DIAGNOSIS — R51 Headache: Secondary | ICD-10-CM | POA: Insufficient documentation

## 2012-10-29 DIAGNOSIS — Z7982 Long term (current) use of aspirin: Secondary | ICD-10-CM | POA: Insufficient documentation

## 2012-10-29 DIAGNOSIS — M542 Cervicalgia: Secondary | ICD-10-CM | POA: Insufficient documentation

## 2012-10-29 DIAGNOSIS — Z862 Personal history of diseases of the blood and blood-forming organs and certain disorders involving the immune mechanism: Secondary | ICD-10-CM | POA: Insufficient documentation

## 2012-10-29 DIAGNOSIS — Z87448 Personal history of other diseases of urinary system: Secondary | ICD-10-CM | POA: Insufficient documentation

## 2012-10-29 DIAGNOSIS — Z8639 Personal history of other endocrine, nutritional and metabolic disease: Secondary | ICD-10-CM | POA: Insufficient documentation

## 2012-10-29 DIAGNOSIS — Z87891 Personal history of nicotine dependence: Secondary | ICD-10-CM | POA: Insufficient documentation

## 2012-10-29 DIAGNOSIS — Z96659 Presence of unspecified artificial knee joint: Secondary | ICD-10-CM | POA: Insufficient documentation

## 2012-10-29 DIAGNOSIS — J449 Chronic obstructive pulmonary disease, unspecified: Secondary | ICD-10-CM | POA: Insufficient documentation

## 2012-10-29 DIAGNOSIS — Z8661 Personal history of infections of the central nervous system: Secondary | ICD-10-CM | POA: Insufficient documentation

## 2012-10-29 DIAGNOSIS — M2569 Stiffness of other specified joint, not elsewhere classified: Secondary | ICD-10-CM | POA: Insufficient documentation

## 2012-10-29 DIAGNOSIS — Z79899 Other long term (current) drug therapy: Secondary | ICD-10-CM | POA: Insufficient documentation

## 2012-10-29 DIAGNOSIS — M25569 Pain in unspecified knee: Secondary | ICD-10-CM | POA: Insufficient documentation

## 2012-10-29 DIAGNOSIS — G8929 Other chronic pain: Secondary | ICD-10-CM | POA: Insufficient documentation

## 2012-10-29 LAB — POCT I-STAT, CHEM 8
BUN: 16 mg/dL (ref 6–23)
Calcium, Ion: 1.17 mmol/L (ref 1.13–1.30)
Chloride: 105 mEq/L (ref 96–112)
Creatinine, Ser: 1.3 mg/dL (ref 0.50–1.35)
Glucose, Bld: 110 mg/dL — ABNORMAL HIGH (ref 70–99)
HCT: 47 % (ref 39.0–52.0)
Hemoglobin: 16 g/dL (ref 13.0–17.0)
Potassium: 3.8 mEq/L (ref 3.5–5.1)
Sodium: 141 mEq/L (ref 135–145)
TCO2: 25 mmol/L (ref 0–100)

## 2012-10-29 LAB — CBC
HCT: 43.2 % (ref 39.0–52.0)
Hemoglobin: 14.8 g/dL (ref 13.0–17.0)
MCH: 29.6 pg (ref 26.0–34.0)
MCHC: 34.3 g/dL (ref 30.0–36.0)
MCV: 86.4 fL (ref 78.0–100.0)
Platelets: 302 10*3/uL (ref 150–400)
RBC: 5 MIL/uL (ref 4.22–5.81)
RDW: 14.7 % (ref 11.5–15.5)
WBC: 13.5 10*3/uL — ABNORMAL HIGH (ref 4.0–10.5)

## 2012-10-29 MED ORDER — HYDROMORPHONE HCL PF 1 MG/ML IJ SOLN
1.0000 mg | Freq: Once | INTRAMUSCULAR | Status: AC
  Administered 2012-10-29: 1 mg via INTRAVENOUS
  Filled 2012-10-29: qty 1

## 2012-10-29 MED ORDER — OXYCODONE-ACETAMINOPHEN 5-325 MG PO TABS: 1.0000 | ORAL_TABLET | Freq: Four times a day (QID) | ORAL | Status: DC | PRN

## 2012-10-29 MED ORDER — CYCLOBENZAPRINE HCL 10 MG PO TABS
5.0000 mg | ORAL_TABLET | Freq: Once | ORAL | Status: AC
  Administered 2012-10-29: 5 mg via ORAL
  Filled 2012-10-29: qty 1

## 2012-10-29 MED ORDER — OXYCODONE-ACETAMINOPHEN 5-325 MG PO TABS
1.0000 | ORAL_TABLET | Freq: Once | ORAL | Status: AC
  Administered 2012-10-29: 1 via ORAL
  Filled 2012-10-29: qty 1

## 2012-10-29 MED ORDER — ONDANSETRON HCL 4 MG/2ML IJ SOLN
4.0000 mg | Freq: Once | INTRAMUSCULAR | Status: AC
  Administered 2012-10-29: 4 mg via INTRAVENOUS
  Filled 2012-10-29: qty 2

## 2012-10-29 MED ORDER — CYCLOBENZAPRINE HCL 10 MG PO TABS: 10.0000 mg | ORAL_TABLET | Freq: Two times a day (BID) | ORAL | Status: DC | PRN

## 2012-10-29 MED ORDER — SODIUM CHLORIDE 0.9 % IV SOLN
INTRAVENOUS | Status: DC
  Administered 2012-10-29: 07:00:00 via INTRAVENOUS

## 2012-10-29 NOTE — ED Notes (Signed)
MD at bedside. 

## 2012-10-29 NOTE — ED Notes (Signed)
Family at bedside. 

## 2012-10-29 NOTE — ED Provider Notes (Signed)
History    CSN: [id number] Arrival date & time 10/29/12  0413  First MD Initiated Contact with Patient 10/29/12 [phone]     Chief Complaint  Patient presents with  . Neck Pain   (Consider location/radiation/quality/duration/timing/severity/associated sxs/prior Treatment) HPI HX per PT - HAs chronic back pain and HAs, presents tonight with mostly HA/ neck pain but also knee pain and joint pain all over, worse over the last 3 days. No trauma, no F/C. Taking hydrocodone at home without relief. No rash, recent travel, photophobia or AMS.    Past Medical History  Diagnosis Date  . Chronic headaches   . Arthritis   . Lumbar disc disease   . Hypertension   . Meningitis 10/2010    related to molart abscess   . COPD (chronic obstructive pulmonary disease)   . Hyperlipidemia   . Chronic LBP   . BPH (benign prostatic hypertrophy)    Past Surgical History  Procedure Laterality Date  . Left rotator cuff repair  2002  . Hernia repair  1975  . Replacement total knee     No family history on file. History  Substance Use Topics  . Smoking status: Former Smoker -- 0.25 packs/day for 51 years    Types: Cigarettes  . Smokeless tobacco: Never Used     Comment: quit 2 weeks ago  . Alcohol Use: No    Review of Systems  Constitutional: Negative for fever and chills.  HENT: Positive for neck pain and neck stiffness.   Eyes: Negative for pain.  Respiratory: Negative for shortness of breath.   Cardiovascular: Negative for chest pain.  Gastrointestinal: Negative for abdominal pain.  Genitourinary: Negative for dysuria.  Musculoskeletal: Positive for back pain. Negative for joint swelling.  Skin: Negative for rash.  Neurological: Positive for headaches.  All other systems reviewed and are negative.    Allergies  Review of patient's allergies indicates no known allergies.  Home Medications   Current Outpatient Rx  Name  Route  Sig  Dispense  Refill  . albuterol (PROVENTIL  HFA;VENTOLIN HFA) 108 (90 BASE) MCG/ACT inhaler   Inhalation   Inhale 2 puffs into the lungs every 6 (six) hours as needed for wheezing or shortness of breath.   1 Inhaler   0   . aspirin 81 MG tablet   Oral   Take 81 mg by mouth daily.           [name] HYDROcodone-acetaminophen (NORCO) 10-325 MG per tablet   Oral   Take 1 tablet by mouth every 8 (eight) hours as needed for pain.   60 tablet   5   . mometasone-formoterol (DULERA) 100-5 MCG/ACT AERO   Inhalation   Inhale 2 puffs into the lungs 2 (two) times daily.   2 Inhaler   0    BP 153/98  Pulse 75  Temp(Src) 98.9 F (37.2 C)  Resp 20  SpO2 96% Physical Exam  Constitutional: He is oriented to person, place, and time. He appears well-developed and well-nourished.  HENT:  Head: Normocephalic and atraumatic.  Eyes: EOM are normal. Pupils are equal, round, and reactive to light.  Neck:  Bilateral paraspinal tenderness. Good range of motion laterally to the left, minimal to the right.   Cardiovascular: Normal rate, regular rhythm and intact distal pulses.   Pulmonary/Chest: Effort normal. No respiratory distress.  Abdominal: Soft. There is no tenderness.  Musculoskeletal: Normal range of motion. He exhibits no edema.  Neurological: He is alert and oriented to person,  place, and time. No cranial nerve deficit. Coordination normal.  Skin: Skin is warm and dry.    ED Course  Procedures (including critical care time) Labs Reviewed  CBC - Abnormal; Notable for the following:    WBC 13.5 (*)    All other components within normal limits  POCT I-STAT, CHEM 8 - Abnormal; Notable for the following:    Glucose, Bld 110 (*)    All other components within normal limits   Dg Cervical Spine Complete  10/29/2012   *RADIOLOGY REPORT*  Clinical Data: Neck pain.  No known injury.  CERVICAL SPINE - COMPLETE 4+ VIEW  Comparison: None.  Findings: Degenerative disc disease changes from C4-5 through C6-7 with disc space narrowing and  spurring.  Moderate to severe bilateral neural foraminal narrowing at these levels, most pronounced bilaterally at C5-6.  No fracture.  Prevertebral soft tissues are normal.  IMPRESSION: Degenerative changes as above.  No acute findings.   Original Report Authenticated By: Charlett Nose, M.D.   Ct Head Wo Contrast  10/29/2012   *RADIOLOGY REPORT*  Clinical Data: Headache.  Neck pain.  CT HEAD WITHOUT CONTRAST  Technique:  Contiguous axial images were obtained from the base of the skull through the vertex without contrast.  Comparison: MRI 11/04/2010  Findings: Mild age related volume loss. No acute intracranial abnormality.  Specifically, no hemorrhage, hydrocephalus, mass lesion, acute infarction, or significant intracranial injury.  No acute calvarial abnormality. Visualized paranasal sinuses and mastoids clear.  Orbital soft tissues unremarkable.  IMPRESSION: No acute intracranial abnormality.   Original Report Authenticated By: Charlett Nose, M.D.   IV dilaudid and IVfs  I had a long discussion with PT and family bedside, I doubt bacterial meningitis given 3 days of symptoms and improved with pain medications.  Daughter concerned about prior diagnosis of meningitis. Patient assures me that those symptoms were different and much more severe.   7:56 AM headache is resolved and neck pain feels much better with improved range of motion. PT ambulates in ED, declines LP and wants to go home.  Rx provided, plan close PCP follow up.  Patient's family agreed to strict return precautions and followup instructions   MDM   Neck pain  Evaluated with imaging and labs reviewed as above.  treated with IV fluids and narcotics, condition improved  Vital signs and nursing notes reviewed and considered  Sunnie Nielsen, MD 10/29/12 2306

## 2012-10-29 NOTE — ED Notes (Signed)
Pt c/o neck pain and headache x 3 days; no known injury; states headache consistent regardless if lying or sitting; no fevers; previous history of meningitis--states feels the same

## 2012-10-30 ENCOUNTER — Telehealth: Payer: Self-pay | Admitting: *Deleted

## 2012-10-30 NOTE — Telephone Encounter (Signed)
Vernona Rieger, pt's daughter called states pt is experiencing neck stiffness and headaches again, she is requesting a spinal tap be scheduled for her father.  Please advise

## 2012-10-30 NOTE — Telephone Encounter (Signed)
Spoke with Vernona Rieger, advised her of Dr Yetta Barre message.

## 2012-10-30 NOTE — Telephone Encounter (Signed)
He needs to go back to the ER

## 2012-12-16 ENCOUNTER — Emergency Department (HOSPITAL_COMMUNITY): Payer: Medicare Other

## 2012-12-16 ENCOUNTER — Encounter (HOSPITAL_COMMUNITY): Payer: Self-pay | Admitting: Emergency Medicine

## 2012-12-16 ENCOUNTER — Emergency Department (HOSPITAL_COMMUNITY)
Admission: EM | Admit: 2012-12-16 | Discharge: 2012-12-17 | Discharge status: Against Medical Advice | Payer: Medicare Other | Attending: Emergency Medicine | Admitting: Emergency Medicine

## 2012-12-16 DIAGNOSIS — Z79899 Other long term (current) drug therapy: Secondary | ICD-10-CM | POA: Insufficient documentation

## 2012-12-16 DIAGNOSIS — I10 Essential (primary) hypertension: Secondary | ICD-10-CM | POA: Insufficient documentation

## 2012-12-16 DIAGNOSIS — R079 Chest pain, unspecified: Secondary | ICD-10-CM | POA: Insufficient documentation

## 2012-12-16 DIAGNOSIS — J449 Chronic obstructive pulmonary disease, unspecified: Secondary | ICD-10-CM | POA: Insufficient documentation

## 2012-12-16 DIAGNOSIS — I219 Acute myocardial infarction, unspecified: Secondary | ICD-10-CM | POA: Insufficient documentation

## 2012-12-16 DIAGNOSIS — F17200 Nicotine dependence, unspecified, uncomplicated: Secondary | ICD-10-CM | POA: Insufficient documentation

## 2012-12-16 DIAGNOSIS — J4489 Other specified chronic obstructive pulmonary disease: Secondary | ICD-10-CM | POA: Insufficient documentation

## 2012-12-16 LAB — CBC
HCT: 43.9 % (ref 39.0–52.0)
Hemoglobin: 15.2 g/dL (ref 13.0–17.0)
MCH: 30.1 pg (ref 26.0–34.0)
MCHC: 34.6 g/dL (ref 30.0–36.0)
MCV: 86.9 fL (ref 78.0–100.0)
Platelets: 313 10*3/uL (ref 150–400)
RBC: 5.05 MIL/uL (ref 4.22–5.81)
RDW: 15.3 % (ref 11.5–15.5)
WBC: 11.5 10*3/uL — ABNORMAL HIGH (ref 4.0–10.5)

## 2012-12-16 LAB — POCT I-STAT TROPONIN I: Troponin i, poc: 0.01 ng/mL (ref 0.00–0.08)

## 2012-12-16 MED ORDER — ASPIRIN 81 MG PO CHEW
324.0000 mg | CHEWABLE_TABLET | Freq: Once | ORAL | Status: AC
  Administered 2012-12-17: 324 mg via ORAL
  Filled 2012-12-16: qty 4

## 2012-12-16 MED ORDER — ALBUTEROL SULFATE (5 MG/ML) 0.5% IN NEBU
5.0000 mg | INHALATION_SOLUTION | Freq: Once | RESPIRATORY_TRACT | Status: AC
  Administered 2012-12-17: 5 mg via RESPIRATORY_TRACT
  Filled 2012-12-16: qty 1

## 2012-12-16 NOTE — ED Notes (Signed)
Pt states that he started having mid substernal CP approx. 2-3 days ago and usually he just takes a 'shot' on his albuterol inhaler but he is now out.

## 2012-12-16 NOTE — ED Provider Notes (Signed)
CSN: [id number]     Arrival date & time 12/16/12  2303 History     First MD Initiated Contact with Patient 12/16/12 2309     Chief Complaint  Patient presents with  . Chest Pain   (Consider location/radiation/quality/duration/timing/severity/associated sxs/prior Treatment) HPI Comments: Patient with a history of HTN, NSTEMI, and COPD presents with a chief complaint of chest pain.  Patient is a poor historian.  He states that he has had the chest pain for the past 3-4 days.  Pain is intermittent.  He is unsure how long the pain last.  He is unable to characterize the pain and unable to state if the pain radiates.  He denies any chest pain at this time.  He is unable to state if the pain is associated with exertion or anything else.  Nothing makes the pain better or worse.  He has not taken anything for the pain prior to arrival.  He reports mild shortness of breath, but denies nausea, vomiting, numbness, lightheadedness, or syncope.   He is unsure if he has had a fever, but denies chills or cough.  Review of the chart shows tht the patient had a NSTEMI thirteen years ago.  He states that he is currently not followed by Cardiology.  He states that he currently smokes, but is unable to state how much.  He states it is less than one ppd.  He has a history of HTN, but is unsure if he has been taking his antihypertensive medications.  He uses an Albuterol inhaler for his COPD, but states that the inhaler ran out a few days ago.  PCP is Dr. Oliver Barre.    The history is provided by the patient.    Past Medical History  Diagnosis Date  . Chronic headaches   . Arthritis   . Lumbar disc disease   . Hypertension   . Meningitis 10/2010    related to molart abscess   . COPD (chronic obstructive pulmonary disease)   . Hyperlipidemia   . Chronic LBP   . BPH (benign prostatic hypertrophy)    Past Surgical History  Procedure Laterality Date  . Left rotator cuff repair  2002  . Hernia repair  1975  .  Replacement total knee     History reviewed. No pertinent family history. History  Substance Use Topics  . Smoking status: Former Smoker -- 0.25 packs/day for 51 years    Types: Cigarettes  . Smokeless tobacco: Never Used     Comment: quit 2 weeks ago  . Alcohol Use: No    Review of Systems  Cardiovascular: Positive for chest pain.  All other systems reviewed and are negative.    Allergies  Review of patient's allergies indicates no known allergies.  Home Medications   Current Outpatient Rx  Name  Route  Sig  Dispense  Refill  . albuterol (PROVENTIL HFA;VENTOLIN HFA) 108 (90 BASE) MCG/ACT inhaler   Inhalation   Inhale 2 puffs into the lungs every 6 (six) hours as needed for wheezing or shortness of breath.   1 Inhaler   0   . aspirin 81 MG tablet   Oral   Take 81 mg by mouth daily.           . cyclobenzaprine (FLEXERIL) 10 MG tablet   Oral   Take 1 tablet (10 mg total) by mouth 2 (two) times daily as needed for muscle spasms.   20 tablet   0   . HYDROcodone-acetaminophen (NORCO) 10-325  MG per tablet   Oral   Take 1 tablet by mouth every 8 (eight) hours as needed for pain.   60 tablet   5   . oxyCODONE-acetaminophen (PERCOCET/ROXICET) 5-325 MG per tablet   Oral   Take 1 tablet by mouth every 6 (six) hours as needed for pain.   12 tablet   0    BP 196/80  Pulse 70  Temp(Src) 98.1 F (36.7 C) (Axillary)  Resp 21  SpO2 98% Physical Exam  Nursing note and vitals reviewed. Constitutional: He appears well-developed and well-nourished. No distress.  HENT:  Head: Normocephalic and atraumatic.  Mouth/Throat: Oropharynx is clear and moist.  Neck: Normal range of motion. Neck supple.  Cardiovascular: Normal rate, regular rhythm, normal heart sounds and intact distal pulses.   Pulmonary/Chest: Effort normal and breath sounds normal. No respiratory distress. He has no wheezes. He has no rales.  Abdominal: Soft. He exhibits no distension and no mass. There is  no tenderness. There is no rebound and no guarding.  Musculoskeletal: Normal range of motion.  No LE edema  Neurological: He is alert.  Skin: Skin is warm and dry. He is not diaphoretic.  Psychiatric: He has a normal mood and affect.    ED Course   Procedures (including critical care time)  Labs Reviewed  CBC - Abnormal; Notable for the following:    WBC 11.5 (*)    All other components within normal limits  BASIC METABOLIC PANEL  PRO B NATRIURETIC PEPTIDE   Dg Chest Port 1 View  12/17/2012   *RADIOLOGY REPORT*  Clinical Data: Chest pain  PORTABLE CHEST - 1 VIEW  Comparison: 02/28/2012  Findings: Heart size is upper limits of normal.  Mild central vascular congestion without overt edema.  No pleural effusion or focal parenchymal consolidation.  No pneumothorax.  IMPRESSION: Borderline cardiomegaly without focal acute finding.   Original Report Authenticated By: Christiana Pellant, M.D.   No diagnosis found.   Date: 12/17/2012  Rate: 64  Rhythm: normal sinus rhythm  QRS Axis: normal  Intervals: normal  ST/T Wave abnormalities: T wave inversion in leads V3-V6  Conduction Disutrbances:none  Narrative Interpretation:   Old EKG Reviewed: changes noted, new t wave inversions    MDM  Patient with a history of HTN and NSTEMI presents today with chest pain.  He denies any pain at the time of my evaluation.  Patient is a poor historian.  Labs unremarkable.  Initial troponin negaative.  CXR negative.  EKG showing new t wave inversion in leads V3-V6.  Therefore, patient admitted to [facility] for chest pain rule out.    Pascal Lux [place], PA-C 12/17/12 1219

## 2012-12-17 LAB — BASIC METABOLIC PANEL
BUN: 10 mg/dL (ref 6–23)
CO2: 28 mEq/L (ref 19–32)
Calcium: 9.2 mg/dL (ref 8.4–10.5)
Chloride: 99 mEq/L (ref 96–112)
Creatinine, Ser: 0.92 mg/dL (ref 0.50–1.35)
GFR calc Af Amer: >90 mL/min (ref >90)
GFR calc non Af Amer: 83 mL/min — ABNORMAL LOW (ref >90)
Glucose, Bld: 103 mg/dL — ABNORMAL HIGH (ref 70–99)
Potassium: 3.7 mEq/L (ref 3.5–5.1)
Sodium: 138 mEq/L (ref 135–145)

## 2012-12-17 LAB — PRO B NATRIURETIC PEPTIDE: Pro B Natriuretic peptide (BNP): 44.2 pg/mL (ref 0–125)

## 2012-12-17 NOTE — ED Notes (Signed)
Pt offered admission but he refused. Risks were explained to pt by both Dr. Rulon Abide and Dr. Sharyon Medicus. Pt set home with inhaler in stable condition.

## 2012-12-17 NOTE — ED Provider Notes (Signed)
Medical screening examination/treatment/procedure(s) were conducted as a shared visit with non-physician practitioner(s) and myself.  I personally evaluated the patient during the encounter Jones Skene, M.D.  Adrian Miller is a 72 y.o. male presenting with chest pain, I agree with PA assessment.  I saw the patient, is currently chest pain-free, not short of breath, walking around the room.  He refused admission by [facility] and wants to leave against medical advice. Patient retains capacity to make this decision for himself, he understands the hospital have a heart attack or die as a result of this decision, accepts the risks and has left AMA, he knows he can return for any reason to the ER.    Jones Skene, MD 12/17/12 2256

## 2013-01-28 ENCOUNTER — Encounter (HOSPITAL_COMMUNITY): Payer: Self-pay | Admitting: Emergency Medicine

## 2013-01-28 ENCOUNTER — Emergency Department (HOSPITAL_COMMUNITY)
Admission: EM | Admit: 2013-01-28 | Discharge: 2013-01-28 | Disposition: A | Discharge status: Home / Self Care | Payer: Medicare Other | Attending: Emergency Medicine | Admitting: Emergency Medicine

## 2013-01-28 DIAGNOSIS — G8929 Other chronic pain: Secondary | ICD-10-CM | POA: Insufficient documentation

## 2013-01-28 DIAGNOSIS — Z8639 Personal history of other endocrine, nutritional and metabolic disease: Secondary | ICD-10-CM | POA: Insufficient documentation

## 2013-01-28 DIAGNOSIS — Z87891 Personal history of nicotine dependence: Secondary | ICD-10-CM | POA: Insufficient documentation

## 2013-01-28 DIAGNOSIS — M129 Arthropathy, unspecified: Secondary | ICD-10-CM | POA: Insufficient documentation

## 2013-01-28 DIAGNOSIS — R21 Rash and other nonspecific skin eruption: Secondary | ICD-10-CM | POA: Insufficient documentation

## 2013-01-28 DIAGNOSIS — Z7982 Long term (current) use of aspirin: Secondary | ICD-10-CM | POA: Insufficient documentation

## 2013-01-28 DIAGNOSIS — Z8669 Personal history of other diseases of the nervous system and sense organs: Secondary | ICD-10-CM | POA: Insufficient documentation

## 2013-01-28 DIAGNOSIS — I10 Essential (primary) hypertension: Secondary | ICD-10-CM | POA: Insufficient documentation

## 2013-01-28 DIAGNOSIS — Z79899 Other long term (current) drug therapy: Secondary | ICD-10-CM | POA: Insufficient documentation

## 2013-01-28 DIAGNOSIS — J449 Chronic obstructive pulmonary disease, unspecified: Secondary | ICD-10-CM | POA: Insufficient documentation

## 2013-01-28 DIAGNOSIS — Z87448 Personal history of other diseases of urinary system: Secondary | ICD-10-CM | POA: Insufficient documentation

## 2013-01-28 DIAGNOSIS — Z862 Personal history of diseases of the blood and blood-forming organs and certain disorders involving the immune mechanism: Secondary | ICD-10-CM | POA: Insufficient documentation

## 2013-01-28 DIAGNOSIS — T7840XA Allergy, unspecified, initial encounter: Secondary | ICD-10-CM

## 2013-01-28 DIAGNOSIS — J4489 Other specified chronic obstructive pulmonary disease: Secondary | ICD-10-CM | POA: Insufficient documentation

## 2013-01-28 MED ORDER — PREDNISONE 20 MG PO TABS
60.0000 mg | ORAL_TABLET | Freq: Once | ORAL | Status: AC
  Administered 2013-01-28: 60 mg via ORAL

## 2013-01-28 MED ORDER — DIPHENHYDRAMINE HCL 25 MG PO TABS: 50.0000 mg | ORAL_TABLET | Freq: Three times a day (TID) | ORAL | Status: DC

## 2013-01-28 MED ORDER — DIPHENHYDRAMINE HCL 25 MG PO CAPS
50.0000 mg | ORAL_CAPSULE | Freq: Once | ORAL | Status: AC
  Administered 2013-01-28: 50 mg via ORAL
  Filled 2013-01-28: qty 2

## 2013-01-28 MED ORDER — PREDNISONE 20 MG PO TABS: 40.0000 mg | ORAL_TABLET | Freq: Every day | ORAL | Status: DC

## 2013-01-28 MED ORDER — PREDNISONE 50 MG PO TABS: 50.0000 mg | ORAL_TABLET | Freq: Every day | ORAL | Status: DC

## 2013-01-28 MED ORDER — RANITIDINE HCL 150 MG PO TABS: 150.0000 mg | ORAL_TABLET | Freq: Two times a day (BID) | ORAL | Status: DC

## 2013-01-28 MED ORDER — PREDNISONE 20 MG PO TABS
60.0000 mg | ORAL_TABLET | Freq: Once | ORAL | Status: DC
  Filled 2013-01-28: qty 3

## 2013-01-28 NOTE — ED Provider Notes (Signed)
I saw and evaluated the patient, reviewed the resident's note and I agree with the findings and plan.  Pertinent History: The patient has had several weeks of intermittent swelling, this includes swelling of the lips which occurred 2 weeks ago, and swelling of his face which family members state he had when they found him earlier in the evening. He has now developed a rash which is itching mostly on his legs but it is bilateral on the trunk, arms and legs. He also has it at the base of his neck bilaterally.   Pertinent Exam findings: On exam he has no facial edema, no angioedema, clear oropharynx. He has a mild urticarial blanching raised red pruritic rash on his bilateral arms and legs and neck. There are no lesions in the mouth, no difficulty with vision, no conjunctival changes.  The patient has allergic reaction type symptoms, whether this is related to the medication or not we are unsure.  Obtained medication list from the pharmacy, no ACE inhibitor's present. The patient is stable for discharge, will discharge on prednisone, antihistamines. Followup with family doctor for medication reconciliation.  I personally interpreted the EKG as well as the resident and agree with the interpretation on the resident's chart.   Vida Roller, MD 01/28/13 2116

## 2013-01-28 NOTE — ED Notes (Addendum)
Per EMS pt here from home c/o scattered rash to left eye, arms, abdomen due to possible bug bites. Pt does not remember being bit by anything. When EMS got on scene pt was diaphoretic and hypotensive 70/40 lying down. EMS gave 50 mg IV benadryl at 1417 and NS IV. Pt is now warm and dry, BP now 166/97. Pt has hx of chronic back pain and has been taking tylenol and ibuprophen "whenever he has pain" and has not had BP medication since April. Lungs clear. NAD noted at this time.

## 2013-01-28 NOTE — ED Provider Notes (Signed)
CSN: [id number]     Arrival date & time 01/28/13  1446 History   First MD Initiated Contact with Patient 01/28/13 1503     Chief Complaint  Patient presents with  . Allergic Reaction  . Rash   (Consider location/radiation/quality/duration/timing/severity/associated sxs/prior Treatment) HPI 72 year old male with a pertinent past medical history of COPD, chronic low back pain and hypertension who presents with an itching rash of his arms and legs. He tried Benadryl with some relief.  Rash has been present for several days. He reports working outside yesterday and thinks he may have been bitten by mosquitoes. He also reports some lower lip swelling yesterday. No one else that he is in close contact with has had similar symptoms. There are no pets in the house. Adrian Miller is unable to provide an accurate medication history or further exposure history. Upon further discussion he did report significant lip swelling 2 weeks ago. He had a picture of this episode on his phone.  Past Medical History  Diagnosis Date  . Chronic headaches   . Arthritis   . Lumbar disc disease   . Hypertension   . Meningitis 10/2010    related to molart abscess   . COPD (chronic obstructive pulmonary disease)   . Hyperlipidemia   . Chronic LBP   . BPH (benign prostatic hypertrophy)    Past Surgical History  Procedure Laterality Date  . Left rotator cuff repair  2002  . Hernia repair  1975  . Replacement total knee     History reviewed. No pertinent family history. History  Substance Use Topics  . Smoking status: Former Smoker -- 0.25 packs/day for 51 years    Types: Cigarettes  . Smokeless tobacco: Never Used     Comment: quit 2 weeks ago  . Alcohol Use: No    Review of Systems  Constitutional: Negative for fever and chills.  HENT: Negative for trouble swallowing.   Eyes: Negative for pain and redness.  Respiratory: Negative for shortness of breath.   Cardiovascular: Negative for chest pain and  palpitations.  Genitourinary: Negative for dysuria and frequency.  Musculoskeletal: Negative for back pain and arthralgias.  Skin: Negative for rash.  Neurological: Negative for syncope, numbness and headaches.  All other systems reviewed and are negative.    Allergies  Review of patient's allergies indicates no known allergies.  Home Medications   Current Outpatient Rx  Name  Route  Sig  Dispense  Refill  . albuterol (PROVENTIL HFA;VENTOLIN HFA) 108 (90 BASE) MCG/ACT inhaler   Inhalation   Inhale 2 puffs into the lungs every 6 (six) hours as needed for wheezing or shortness of breath.   1 Inhaler   0   . aspirin 81 MG tablet   Oral   Take 81 mg by mouth daily.           . cyclobenzaprine (FLEXERIL) 10 MG tablet   Oral   Take 1 tablet (10 mg total) by mouth 2 (two) times daily as needed for muscle spasms.   20 tablet   0   . HYDROcodone-acetaminophen (NORCO) 10-325 MG per tablet   Oral   Take 1 tablet by mouth every 6 (six) hours as needed. For pain          BP 149/75  Pulse 62  Temp(Src) 97.6 F (36.4 C) (Oral)  Resp 16  SpO2 97% Physical Exam  Vitals reviewed. Constitutional: He is oriented to person, place, and time. He appears well-developed and well-nourished. No distress.  HENT:  Head: Normocephalic.  Right Ear: External ear normal.  Left Ear: External ear normal.  Nose: Nose normal.  Mouth/Throat: Oropharynx is clear and moist. No oropharyngeal exudate.  No mucosal lesions  Eyes: Conjunctivae and EOM are normal. Pupils are equal, round, and reactive to light.  Neck: Normal range of motion. Neck supple.  Cardiovascular: Normal rate, regular rhythm, normal heart sounds and intact distal pulses.  Exam reveals no gallop and no friction rub.   No murmur heard. Pulmonary/Chest: Effort normal and breath sounds normal.  Abdominal: Soft. Bowel sounds are normal. He exhibits no distension. There is no tenderness.  Musculoskeletal: Normal range of motion. He  exhibits no edema and no tenderness.  Neurological: He is alert and oriented to person, place, and time. No cranial nerve deficit.  Skin: Skin is warm and dry.  Blanchable urticarial rash with some areas of excoriation of his bilateral upper and lower extremities involving thighs, lower legs, upper arms and forearms   Psychiatric: He has a normal mood and affect.    ED Course  Procedures (including critical care time) Labs Review Labs Reviewed - No data to display Imaging Review No results found.  MDM   2 y M here with an itching rash.  No mucosal lesions or signs of SJS/TEN.  Rash not c/w scabies.  None of his close contacts have similar symptoms.  No pets.  Pt is A&Ox4 but is a poor historian and can't provide an accurate exposure or medication hx.  Benadryl and prednisone here.  Rx's for benadryl, prednisone and zantac.  Allergic reaction return precautions reviewed.  They were instructed to call 911 with any sx of difficulty breathing, tongue/throat swelling, etc.  I was unable to obtain a reliable medication history even after contacting his pharmacy. Given these difficulties he was counseled to hold all of his medications including OTCs and to followup with his PCP with next available appointment, preferably by tomorrow.  This plan was also reviewed with family at the bedside. Everyone was in agreement.  Clinical Impression: 1. Allergic reaction, initial encounter     Disposition: Discharge  Condition: Good  I have discussed the results, Dx and Tx plan. They expressed understanding and agree with the plan and were told to return to ED with any worsening of condition or concern.    Discharge Medication List as of 01/28/2013  4:35 PM    START taking these medications   Details  diphenhydrAMINE (BENADRYL) 25 MG tablet Take 2 tablets (50 mg total) by mouth every 8 (eight) hours. For three days, Starting 01/28/2013, Until Discontinued, Print    ranitidine (ZANTAC) 150 MG tablet  Take 1 tablet (150 mg total) by mouth 2 (two) times daily., Starting 01/28/2013, Until Discontinued, Print      predniSONE (DELTASONE) 20 MG tablet Take 2 tablets (40 mg total) by mouth daily. Beginning 01/29/13, Refills: 0   Follow Up: Etta Grandchild, MD [address] [place] [address] [phone]  Schedule an appointment as soon as possible for a visit    Pt seen in conjunction with Dr. Hyacinth Meeker.  Adrian Just. Beverely Pace, MD Emergency Medicine PGY-III [phone]    Oleh Genin, MD 01/28/13 [phone]

## 2013-01-28 NOTE — ED Notes (Signed)
MD at bedside. 

## 2013-02-10 ENCOUNTER — Other Ambulatory Visit: Payer: Self-pay | Admitting: Internal Medicine

## 2013-02-11 NOTE — Telephone Encounter (Signed)
Patient needs follow up appointment

## 2013-03-05 ENCOUNTER — Telehealth: Payer: Self-pay | Admitting: Internal Medicine

## 2013-03-05 NOTE — Telephone Encounter (Signed)
Pt's daughter call request referral for allergy specialist. Pt face, eye and lip is swollen and the daughter is very concern about this. Please call pt's daughter if this is ok.

## 2013-03-05 NOTE — Telephone Encounter (Signed)
I have not seen him recently so I don't feel comfortable sending a referral

## 2013-03-05 NOTE — Telephone Encounter (Signed)
Called left Vernona Rieger vm to call back and make an appt with Dr. Yetta Barre for Mr. Grauberger.

## 2013-03-06 ENCOUNTER — Encounter: Payer: Self-pay | Admitting: Internal Medicine

## 2013-03-06 ENCOUNTER — Other Ambulatory Visit (INDEPENDENT_AMBULATORY_CARE_PROVIDER_SITE_OTHER): Payer: Medicare Other

## 2013-03-06 ENCOUNTER — Ambulatory Visit (INDEPENDENT_AMBULATORY_CARE_PROVIDER_SITE_OTHER): Payer: Medicare Other | Admitting: Internal Medicine

## 2013-03-06 VITALS — BP 140/90 | HR 71 | Temp 97.0°F | Resp 16 | Ht 69.0 in | Wt 222.0 lb

## 2013-03-06 DIAGNOSIS — L500 Allergic urticaria: Secondary | ICD-10-CM

## 2013-03-06 DIAGNOSIS — I10 Essential (primary) hypertension: Secondary | ICD-10-CM

## 2013-03-06 DIAGNOSIS — N289 Disorder of kidney and ureter, unspecified: Secondary | ICD-10-CM

## 2013-03-06 DIAGNOSIS — D72829 Elevated white blood cell count, unspecified: Secondary | ICD-10-CM

## 2013-03-06 LAB — BASIC METABOLIC PANEL
BUN: 10 mg/dL (ref 6–23)
CO2: 27 mEq/L (ref 19–32)
Calcium: 9.2 mg/dL (ref 8.4–10.5)
Chloride: 104 mEq/L (ref 96–112)
Creatinine, Ser: 0.9 mg/dL (ref 0.4–1.5)
GFR: 101.5 mL/min (ref >60.00)
Glucose, Bld: 90 mg/dL (ref 70–99)
Potassium: 4.2 mEq/L (ref 3.5–5.1)
Sodium: 139 mEq/L (ref 135–145)

## 2013-03-06 LAB — CBC WITH DIFFERENTIAL/PLATELET
Basophils Absolute: 0 10*3/uL (ref 0.0–0.1)
Basophils Relative: 0.4 % (ref 0.0–3.0)
Eosinophils Absolute: 0.1 10*3/uL (ref 0.0–0.7)
Eosinophils Relative: 1.7 % (ref 0.0–5.0)
HCT: 45.3 % (ref 39.0–52.0)
Hemoglobin: 15.3 g/dL (ref 13.0–17.0)
Lymphocytes Relative: 27 % (ref 12.0–46.0)
Lymphs Abs: 2.3 10*3/uL (ref 0.7–4.0)
MCHC: 33.8 g/dL (ref 30.0–36.0)
MCV: 89.2 fl (ref 78.0–100.0)
Monocytes Absolute: 0.7 10*3/uL (ref 0.1–1.0)
Monocytes Relative: 8.7 % (ref 3.0–12.0)
Neutro Abs: 5.3 10*3/uL (ref 1.4–7.7)
Neutrophils Relative %: 62.2 % (ref 43.0–77.0)
Platelets: 331 10*3/uL (ref 150.0–400.0)
RBC: 5.08 Mil/uL (ref 4.22–5.81)
RDW: 15.3 % — ABNORMAL HIGH (ref 11.5–14.6)
WBC: 8.5 10*3/uL (ref 4.5–10.5)

## 2013-03-06 LAB — URINALYSIS, ROUTINE W REFLEX MICROSCOPIC
Bilirubin Urine: NEGATIVE
Hgb urine dipstick: NEGATIVE
Ketones, ur: NEGATIVE
Leukocytes, UA: NEGATIVE
Nitrite: NEGATIVE
Specific Gravity, Urine: 1.025 (ref 1.000–1.030)
Total Protein, Urine: NEGATIVE
Urine Glucose: NEGATIVE
Urobilinogen, UA: 0.2 (ref 0.0–1.0)
pH: 6 (ref 5.0–8.0)

## 2013-03-06 LAB — SEDIMENTATION RATE: Sed Rate: 10 mm/hr (ref 0–22)

## 2013-03-06 LAB — TSH: TSH: 1.24 u[IU]/mL (ref 0.35–5.50)

## 2013-03-06 MED ORDER — HYDROXYZINE HCL 50 MG PO TABS: 50.0000 mg | ORAL_TABLET | Freq: Three times a day (TID) | ORAL | Status: DC | PRN

## 2013-03-06 NOTE — Progress Notes (Signed)
Subjective:    Patient ID: Adrian Miller, male    DOB: 1940-08-08, 72 y.o.   MRN: [id number]  Allergic Reaction This is a new problem. The current episode started more than 1 week ago. The problem occurs intermittently. The problem is unchanged. The problem is moderate. Associated with: aspirin. The time of exposure was just prior to onset. Associated symptoms include itching and a rash. Pertinent negatives include no abdominal pain, chest pain, chest pressure, coughing, diarrhea, difficulty breathing, drooling, eye itching, eye redness, eye watering, globus sensation, hyperventilation, stridor, trouble swallowing or wheezing. Swelling is present on the eyes and face. Past treatments include diphenhydramine. The treatment provided mild relief.      Review of Systems  Constitutional: Negative.  Negative for fever, chills, diaphoresis, appetite change and fatigue.  HENT: Negative.  Negative for drooling and trouble swallowing.   Eyes: Negative.  Negative for redness and itching.  Respiratory: Negative.  Negative for cough, choking, chest tightness, shortness of breath, wheezing and stridor.   Cardiovascular: Negative.  Negative for chest pain, palpitations and leg swelling.  Gastrointestinal: Negative.  Negative for abdominal pain and diarrhea.  Endocrine: Negative.   Genitourinary: Negative.   Musculoskeletal: Negative.   Skin: Positive for itching and rash. Negative for color change, pallor and wound.       Intermittent swelling of lips, eyelids over the last months. Also has itchy whelps that come and go over his torso, arms, and legs. He has been taking aspirin.  Allergic/Immunologic: Negative.   Neurological: Negative.  Negative for dizziness, tremors, weakness and light-headedness.  Hematological: Negative.  Negative for adenopathy. Does not bruise/bleed easily.  Psychiatric/Behavioral: Negative.        Objective:   Physical Exam  Vitals reviewed. Constitutional: He is oriented to  person, place, and time. He appears well-developed and well-nourished. No distress.  HENT:  Head: Normocephalic and atraumatic.  Mouth/Throat: Oropharynx is clear and moist. No oropharyngeal exudate.  Eyes: Conjunctivae are normal. Right eye exhibits no discharge. Left eye exhibits no discharge. No scleral icterus.  Neck: Normal range of motion. Neck supple. No JVD present. No tracheal deviation present. No thyromegaly present.  Cardiovascular: Normal rate, regular rhythm, normal heart sounds and intact distal pulses.  Exam reveals no gallop and no friction rub.   No murmur heard. Pulmonary/Chest: Effort normal and breath sounds normal. No accessory muscle usage or stridor. Not tachypneic. No respiratory distress. He has no decreased breath sounds. He has no wheezes. He has no rhonchi. He has no rales. He exhibits no tenderness.  Abdominal: Soft. Bowel sounds are normal. He exhibits no distension and no mass. There is no tenderness. There is no rebound and no guarding.  Musculoskeletal: Normal range of motion. He exhibits no edema and no tenderness.  Lymphadenopathy:    He has no cervical adenopathy.  Neurological: He is oriented to person, place, and time.  Skin: Skin is warm and dry. Rash noted. No purpura noted. Rash is urticarial. Rash is not macular, not papular, not maculopapular, not nodular, not pustular and not vesicular. He is not diaphoretic. No erythema. No pallor.  Right upper eyelid is mildly swollen, there are wheals on his torso.  Psychiatric: He has a normal mood and affect. His behavior is normal. Judgment and thought content normal.     Lab Results  Component Value Date   WBC 11.5* 12/16/2012   HGB 15.2 12/16/2012   HCT 43.9 12/16/2012   PLT 313 12/16/2012   GLUCOSE 103* 12/16/2012  CHOL 94 11/04/2010   TRIG 78 11/04/2010   HDL 30* 11/04/2010   LDLCALC 48 11/04/2010   ALT 31 02/05/2012   AST 33 02/05/2012   NA 138 12/16/2012   K 3.7 12/16/2012   CL 99 12/16/2012   CREATININE  0.92 12/16/2012   BUN 10 12/16/2012   CO2 28 12/16/2012   TSH 0.372 02/05/2012   INR 1.8* 02/11/2008   HGBA1C 6.0* 11/04/2010       Assessment & Plan:

## 2013-03-06 NOTE — Patient Instructions (Signed)
Angioedema Angioedema (AE) is a sudden swelling of the eyelids, lips, lobes of ears, external genitalia, skin, and other parts of the body. AE can happen by itself. It usually begins during the night and is found on awakening. It can happen with hives and other allergic reactions. Attacks can be mild and annoying, or life-threatening if the air passages swell. AE generally occurs in a short time period (over minutes to hours) and gets better in 24 to 48 hours. It usually does not cause any serious problems.  There are 2 different kinds of AE:   Allergic AE.  Nonallergic AE.  There may be an overreaction or direct stimulation of cells that are a part of the immune system (mast cells).  There may be problems with the release of chemicals made by the body that cause swelling and inflammation (kinins). AE due to kinins can be inherited from parents (hereditary), or it can develop on its own (acquired). Acquired AE either shows up before, or along with, certain diseases or is due to the body's immune system attacking parts of the body's own cells (autoimmune). CAUSES  Allergic  AE due to allergic reactions are caused by something that causes the body to react (trigger). Common triggers include:  Foods.  Medicines.  Latex.  Direct contact with certain fruits, vegetables, or animal saliva.  Insect stings. Nonallergic  Mast cell stimulation may be caused by:  Medicines.  Dyes used in X-rays.  The body's own immune system reactions to parts of the body (autoimmune disease).  Possibly, some virus infections.  AE due to problems with kinins can be hereditary or acquired. Attacks are triggered by:  Mild injury.  Dental work or any surgery.  Stress.  Sudden changes in temperature.  Exercise.  Medicines.  AE due to problems with kinins can also be due to certain medicines, especially blood pressure medicines like angiotensin-converting enzyme (ACE) inhibitors. African Americans  are at nearly 5 times greater risk of developing AE than Caucasians from ACE inhibitors. SYMPTOMS  Allergic symptoms:  Non-itchy swelling of the skin. Often the swelling is on the face and lips, but any area of the skin can swell. Sometimes, the swelling can be painful. If hives are present, there is intense itching.  Breathing problems if the air passages swell. Nonallergic symptoms:  If internal organs are involved, there may be:  Nausea.  Abdominal pain.  Vomiting.  Difficulty swallowing.  Difficulty passing urine.  Breathing problems if the air passages swell. Depending on the cause of AE, episodes may:  Only happen once (if triggers are removed or avoided).  Come back in unpredictable patterns.  Repeat for several years and then gradually fade away. DIAGNOSIS  AE is diagnosed by:   Asking questions to find out how fast the symptoms began.  Taking a family history.  Physical exam.  Diagnostic tests. Tests could include:  Allergy skin tests to see if the problem is allergic.  Blood tests to diagnose hereditary and some acquired types of AE.  Other tests to see if there is a hidden disease leading to the AE. TREATMENT  Treatment depends on the type and cause (if any) of the AE. Allergic  Allergic types of AE are treated with:  Immediate removal of the trigger or medicine (if any).  Epinephrine injection.  Steroids.  Antihistamines.  Hospitalization for severe attacks. Nonallergic  Mast cell stimulation types of AE are treated with:  Immediate removal of the trigger or medicine (if any).  Epinephrine injection.    Steroids.  Antihistamines.  Hospitalization for severe attacks.  Hereditary AE is treated with:  Medicines to prevent and treat attacks. There is little response to antihistamines, epinephrine, or steroids.  Preventive medicines before dental work or surgery.  Removing or avoiding medicines that trigger  attacks.  Hospitalization for severe attacks.  Acquired AE is treated with:  Treating underlying disease (if any).  Medicines to prevent and treat attacks. HOME CARE INSTRUCTIONS   Always carry your emergency allergy treatment medicines with you.  Wear a medical bracelet.  Avoid known triggers. SEEK MEDICAL CARE IF:   You get repeat attacks.  Your attacks are more frequent or more severe despite preventive measures.  You have hereditary AE and are considering having children. It is important to discuss the risks of passing this on to your children. SEEK IMMEDIATE MEDICAL CARE IF:   You have difficulty breathing.  You have difficulty swallowing.  You experience fainting. This condition should be treated immediately. It can be life-threatening if it involves throat swelling. Document Released: 07/02/2001 Document Revised: 07/16/2011 Document Reviewed: 04/22/2008 [employer] Patient Information 2014 [employer].  

## 2013-03-06 NOTE — Assessment & Plan Note (Signed)
I will recheck his CBC Will also look at his SPEP and ESR to see if he has a lymphoproliferative process

## 2013-03-06 NOTE — Assessment & Plan Note (Signed)
His BP is well controlled 

## 2013-03-06 NOTE — Assessment & Plan Note (Signed)
He will stop the aspirin and will avoid alcohol He will try atarax as needed Today I will check his labs to look for secondary causes

## 2013-03-10 ENCOUNTER — Other Ambulatory Visit: Payer: Self-pay | Admitting: Internal Medicine

## 2013-03-10 ENCOUNTER — Encounter: Payer: Self-pay | Admitting: Internal Medicine

## 2013-03-10 DIAGNOSIS — D72829 Elevated white blood cell count, unspecified: Secondary | ICD-10-CM

## 2013-03-10 LAB — PROTEIN ELECTROPHORESIS, SERUM
Albumin ELP: 55.1 % — ABNORMAL LOW (ref 55.8–66.1)
Alpha-1-Globulin: 4.2 % (ref 2.9–4.9)
Alpha-2-Globulin: 9.7 % (ref 7.1–11.8)
Beta 2: 6.4 % (ref 3.2–6.5)
Beta Globulin: 7.3 % — ABNORMAL HIGH (ref 4.7–7.2)
Gamma Globulin: 17.3 % (ref 11.1–18.8)
Total Protein, Serum Electrophoresis: 7.7 g/dL (ref 6.0–8.3)

## 2013-03-12 ENCOUNTER — Telehealth: Payer: Self-pay | Admitting: Internal Medicine

## 2013-03-12 ENCOUNTER — Telehealth: Payer: Self-pay | Admitting: Oncology

## 2013-03-12 ENCOUNTER — Other Ambulatory Visit: Payer: Self-pay

## 2013-03-12 NOTE — Telephone Encounter (Signed)
S/w pt and gve np appt 11/19 @ 1:30 w/Dr. Clelia Croft Referring Dr. Sanda Linger Dx-leukocytosis Welcome packet mailed.

## 2013-03-12 NOTE — Telephone Encounter (Signed)
03/12/2013  Pt was calling to get lab results. Was speaking with Dr. Yetta Barre and was cut off.  Please call back.

## 2013-03-12 NOTE — Telephone Encounter (Signed)
C/D 03/12/13 for appt. 03/25/13

## 2013-03-13 NOTE — Telephone Encounter (Signed)
Results and e-mail sent via mychart

## 2013-03-16 ENCOUNTER — Telehealth: Payer: Self-pay | Admitting: Internal Medicine

## 2013-03-16 NOTE — Telephone Encounter (Signed)
03/16/2013  Pt called wanting to know results of lab work that was done on 03/06/2013.  Please contact pt.

## 2013-03-18 LAB — HM DIABETES EYE EXAM

## 2013-03-20 ENCOUNTER — Other Ambulatory Visit: Payer: Self-pay | Admitting: Oncology

## 2013-03-20 DIAGNOSIS — D72829 Elevated white blood cell count, unspecified: Secondary | ICD-10-CM

## 2013-03-25 ENCOUNTER — Emergency Department (HOSPITAL_COMMUNITY): Payer: Medicare Other

## 2013-03-25 ENCOUNTER — Ambulatory Visit: Payer: Medicare Other

## 2013-03-25 ENCOUNTER — Other Ambulatory Visit: Payer: Medicare Other | Admitting: Lab

## 2013-03-25 ENCOUNTER — Encounter: Payer: Self-pay | Admitting: *Deleted

## 2013-03-25 ENCOUNTER — Emergency Department (HOSPITAL_COMMUNITY)
Admission: EM | Admit: 2013-03-25 | Discharge: 2013-03-25 | Disposition: A | Discharge status: Home / Self Care | Payer: Medicare Other | Attending: Emergency Medicine | Admitting: Emergency Medicine

## 2013-03-25 ENCOUNTER — Encounter: Payer: Medicare Other | Admitting: Oncology

## 2013-03-25 ENCOUNTER — Encounter (HOSPITAL_COMMUNITY): Payer: Self-pay | Admitting: Emergency Medicine

## 2013-03-25 DIAGNOSIS — Z8661 Personal history of infections of the central nervous system: Secondary | ICD-10-CM | POA: Insufficient documentation

## 2013-03-25 DIAGNOSIS — Z79899 Other long term (current) drug therapy: Secondary | ICD-10-CM | POA: Insufficient documentation

## 2013-03-25 DIAGNOSIS — M129 Arthropathy, unspecified: Secondary | ICD-10-CM | POA: Insufficient documentation

## 2013-03-25 DIAGNOSIS — G8929 Other chronic pain: Secondary | ICD-10-CM | POA: Insufficient documentation

## 2013-03-25 DIAGNOSIS — J4489 Other specified chronic obstructive pulmonary disease: Secondary | ICD-10-CM | POA: Insufficient documentation

## 2013-03-25 DIAGNOSIS — G510 Bell's palsy: Secondary | ICD-10-CM | POA: Insufficient documentation

## 2013-03-25 DIAGNOSIS — Z862 Personal history of diseases of the blood and blood-forming organs and certain disorders involving the immune mechanism: Secondary | ICD-10-CM | POA: Insufficient documentation

## 2013-03-25 DIAGNOSIS — IMO0002 Reserved for concepts with insufficient information to code with codable children: Secondary | ICD-10-CM | POA: Insufficient documentation

## 2013-03-25 DIAGNOSIS — Z8639 Personal history of other endocrine, nutritional and metabolic disease: Secondary | ICD-10-CM | POA: Insufficient documentation

## 2013-03-25 DIAGNOSIS — Z87891 Personal history of nicotine dependence: Secondary | ICD-10-CM | POA: Insufficient documentation

## 2013-03-25 DIAGNOSIS — I10 Essential (primary) hypertension: Secondary | ICD-10-CM | POA: Insufficient documentation

## 2013-03-25 DIAGNOSIS — J449 Chronic obstructive pulmonary disease, unspecified: Secondary | ICD-10-CM | POA: Insufficient documentation

## 2013-03-25 DIAGNOSIS — Z87448 Personal history of other diseases of urinary system: Secondary | ICD-10-CM | POA: Insufficient documentation

## 2013-03-25 LAB — DIFFERENTIAL
Basophils Absolute: 0 10*3/uL (ref 0.0–0.1)
Basophils Relative: 0 % (ref 0–1)
Eosinophils Absolute: 0.1 10*3/uL (ref 0.0–0.7)
Eosinophils Relative: 1 % (ref 0–5)
Lymphocytes Relative: 32 % (ref 12–46)
Lymphs Abs: 2.7 10*3/uL (ref 0.7–4.0)
Monocytes Absolute: 0.5 10*3/uL (ref 0.1–1.0)
Monocytes Relative: 6 % (ref 3–12)
Neutro Abs: 5.2 10*3/uL (ref 1.7–7.7)
Neutrophils Relative %: 61 % (ref 43–77)

## 2013-03-25 LAB — CBC
HCT: 45.1 % (ref 39.0–52.0)
Hemoglobin: 15.1 g/dL (ref 13.0–17.0)
MCH: 29.3 pg (ref 26.0–34.0)
MCHC: 33.5 g/dL (ref 30.0–36.0)
MCV: 87.4 fL (ref 78.0–100.0)
Platelets: 329 10*3/uL (ref 150–400)
RBC: 5.16 MIL/uL (ref 4.22–5.81)
RDW: 14.4 % (ref 11.5–15.5)
WBC: 8.5 10*3/uL (ref 4.0–10.5)

## 2013-03-25 LAB — TROPONIN I: Troponin I: <0.3 ng/mL (ref <0.30)

## 2013-03-25 LAB — POCT I-STAT, CHEM 8
BUN: 10 mg/dL (ref 6–23)
Calcium, Ion: 1.17 mmol/L (ref 1.13–1.30)
Chloride: 105 mEq/L (ref 96–112)
Creatinine, Ser: 1.2 mg/dL (ref 0.50–1.35)
Glucose, Bld: 136 mg/dL — ABNORMAL HIGH (ref 70–99)
HCT: 52 % (ref 39.0–52.0)
Hemoglobin: 17.7 g/dL — ABNORMAL HIGH (ref 13.0–17.0)
Potassium: 3.7 mEq/L (ref 3.5–5.1)
Sodium: 143 mEq/L (ref 135–145)
TCO2: 25 mmol/L (ref 0–100)

## 2013-03-25 LAB — PROTIME-INR
INR: 1.01 (ref 0.00–1.49)
Prothrombin Time: 13.1 seconds (ref 11.6–15.2)

## 2013-03-25 LAB — POCT I-STAT TROPONIN I: Troponin i, poc: 0 ng/mL (ref 0.00–0.08)

## 2013-03-25 LAB — COMPREHENSIVE METABOLIC PANEL
ALT: 24 U/L (ref 0–53)
AST: 29 U/L (ref 0–37)
Albumin: 4.2 g/dL (ref 3.5–5.2)
Alkaline Phosphatase: 119 U/L — ABNORMAL HIGH (ref 39–117)
BUN: 10 mg/dL (ref 6–23)
CO2: 24 mEq/L (ref 19–32)
Calcium: 9.5 mg/dL (ref 8.4–10.5)
Chloride: 104 mEq/L (ref 96–112)
Creatinine, Ser: 1.01 mg/dL (ref 0.50–1.35)
GFR calc Af Amer: 84 mL/min — ABNORMAL LOW (ref >90)
GFR calc non Af Amer: 73 mL/min — ABNORMAL LOW (ref >90)
Glucose, Bld: 133 mg/dL — ABNORMAL HIGH (ref 70–99)
Potassium: 4.4 mEq/L (ref 3.5–5.1)
Sodium: 139 mEq/L (ref 135–145)
Total Bilirubin: 0.4 mg/dL (ref 0.3–1.2)
Total Protein: 8.3 g/dL (ref 6.0–8.3)

## 2013-03-25 LAB — GLUCOSE, CAPILLARY: Glucose-Capillary: 116 mg/dL — ABNORMAL HIGH (ref 70–99)

## 2013-03-25 LAB — APTT: aPTT: 34 seconds (ref 24–37)

## 2013-03-25 MED ORDER — PREDNISONE (PAK) 10 MG PO TABS: ORAL_TABLET | ORAL | Status: DC

## 2013-03-25 NOTE — ED Notes (Signed)
MRI transported pt to MRI.

## 2013-03-25 NOTE — ED Notes (Signed)
Pt currently in MRI, unable to perform 1400 Neuro check.

## 2013-03-25 NOTE — Progress Notes (Signed)
This encounter was created in error - please disregard.

## 2013-03-25 NOTE — Progress Notes (Signed)
Mr. Askren and his daughter in [facility] lobby at 12:45 for new patient visit.  Asked registration for help reporting they believe he has had a stroke.  This nurse to lobby.  Patient seated with cane in hand.  Daughter reports his "FACE IS TWISTED, EYE DROOPING and trying to figure out what is wrong with him because he didn't look like this last night."  This nurse noted red tinted stains to white shirt at his mid upper chest near clavicle.  Reports having trouble drinking today.  He noticed these changes when he awakened this morning.  Speech clear, denies trouble walking or trouble with use of limbs.  Daughter reports "he fell within the last two weeks and does not want to call 911 or have family call for help."  This nurse took him to the ER, registered him and he was taken to a room immediately for stroke like symptoms.  Dr. Clelia Croft notified.  I returned to [facility] at 1300.

## 2013-03-25 NOTE — ED Provider Notes (Signed)
CSN: [id number]     Arrival date & time 03/25/13  1249 History   First MD Initiated Contact with Patient 03/25/13 1303     Chief Complaint  Patient presents with  . Facial Droop  . Weakness    Patient is a 72 y.o. male presenting with weakness.  Weakness   Patient presents to the emergency room with complaints of a facial droop. Patient states he noticed it last night. The symptoms have persisted throughout the day. Patient denies any trouble with his speech. He does feel like his balance is a little off in his not sure if his right side is a little bit weakeR.  However, he does have a history of back trouble and weakness in that same leg which is chronic. The patient denies any headache. He denies any fevers or chills. Denies any neck pain or chest pain. No visual disturbances Past Medical History  Diagnosis Date  . Chronic headaches   . Arthritis   . Lumbar disc disease   . Hypertension   . Meningitis 10/2010    related to molart abscess   . COPD (chronic obstructive pulmonary disease)   . Hyperlipidemia   . Chronic LBP   . BPH (benign prostatic hypertrophy)    Past Surgical History  Procedure Laterality Date  . Left rotator cuff repair  2002  . Hernia repair  1975  . Replacement total knee     History reviewed. No pertinent family history. History  Substance Use Topics  . Smoking status: Former Smoker -- 0.25 packs/day for 51 years    Types: Cigarettes  . Smokeless tobacco: Never Used     Comment: quit 2 weeks ago  . Alcohol Use: No    Review of Systems  Neurological: Positive for weakness.  All other systems reviewed and are negative.    Allergies  Asa  Home Medications   Current Outpatient Rx  Name  Route  Sig  Dispense  Refill  . albuterol (PROVENTIL HFA;VENTOLIN HFA) 108 (90 BASE) MCG/ACT inhaler   Inhalation   Inhale 2 puffs into the lungs every 6 (six) hours as needed for wheezing or shortness of breath.   1 Inhaler   0   . amLODipine (NORVASC) 5  MG tablet   Oral   Take 5 mg by mouth daily.         [name] HYDROcodone-acetaminophen (NORCO) 10-325 MG per tablet   Oral   Take 1 tablet by mouth every 6 (six) hours as needed for moderate pain.         . hydrOXYzine (ATARAX/VISTARIL) 50 MG tablet   Oral   Take 1 tablet (50 mg total) by mouth 3 (three) times daily as needed for itching.   75 tablet   3   . mometasone-formoterol (DULERA) 100-5 MCG/ACT AERO   Inhalation   Inhale 2 puffs into the lungs 2 (two) times daily.         . predniSONE (STERAPRED UNI-PAK) 10 MG tablet      Take 6 tabs by mouth daily  for 2 days, then 5 tabs for 2 days, then 4 tabs for 2 days, then 3 tabs for 2 days, 2 tabs for 2 days, then 1 tab by mouth daily for 2 days   42 tablet   0    BP 146/82  Pulse 66  Temp(Src) 98.7 F (37.1 C) (Oral)  Resp 15  SpO2 96% Physical Exam  Nursing note and vitals reviewed. Constitutional: He is oriented to  person, place, and time. He appears well-developed and well-nourished. No distress.  HENT:  Head: Normocephalic and atraumatic.  Right Ear: External ear normal.  Left Ear: External ear normal.  Mouth/Throat: Oropharynx is clear and moist.  Eyes: Conjunctivae are normal. Right eye exhibits no discharge. Left eye exhibits no discharge. No scleral icterus.  Neck: Neck supple. No tracheal deviation present.  Cardiovascular: Normal rate, regular rhythm and intact distal pulses.   Pulmonary/Chest: Effort normal and breath sounds normal. No stridor. No respiratory distress. He has no wheezes. He has no rales.  Abdominal: Soft. Bowel sounds are normal. He exhibits no distension. There is no tenderness. There is no rebound and no guarding.  Musculoskeletal: He exhibits no edema and no tenderness.  Neurological: He is alert and oriented to person, place, and time. He has normal strength. A cranial nerve deficit (Left-sided facial droop it does seem to involve the forehead slightly, extraocular movements intact, tongue  midline  ) is present. No sensory deficit. He exhibits normal muscle tone. He displays no seizure activity. Coordination normal.  No pronator drift bilateral upper extrem, able to hold both legs off bed for 5 seconds, sensation intact in all extremities, no visual field cuts, no left or right sided neglect, normal finger-nose exam bilaterally, no nystagmus noted   Skin: Skin is warm and dry. No rash noted.  Psychiatric: He has a normal mood and affect.    ED Course  Procedures (including critical care time) Labs Review Labs Reviewed  GLUCOSE, CAPILLARY - Abnormal; Notable for the following:    Glucose-Capillary 116 (*)    All other components within normal limits  COMPREHENSIVE METABOLIC PANEL - Abnormal; Notable for the following:    Glucose, Bld 133 (*)    Alkaline Phosphatase 119 (*)    GFR calc non Af Amer 73 (*)    GFR calc Af Amer 84 (*)    All other components within normal limits  POCT I-STAT, CHEM 8 - Abnormal; Notable for the following:    Glucose, Bld 136 (*)    Hemoglobin 17.7 (*)    All other components within normal limits  PROTIME-INR  APTT  CBC  DIFFERENTIAL  TROPONIN I  POCT I-STAT TROPONIN I   Imaging Review Mr Brain Wo Contrast  03/25/2013   CLINICAL DATA:  Right-sided facial droop, weakness and slurred speech. History of high blood pressure hyperlipidemia.  EXAM: MRI HEAD WITHOUT CONTRAST  TECHNIQUE: Multiplanar, multiecho pulse sequences of the brain and surrounding structures were obtained without intravenous contrast.  COMPARISON:  10/29/2012 head CT.  11/04/2010 brain MR.  FINDINGS: Sequences are motion degraded.  No acute infarct.  No intracranial hemorrhage.  Mild to moderate small vessel disease type changes including that involving the pons with minimal progression since prior exam.  Global atrophy without hydrocephalus. Slightly asymmetric appearance of the subarachnoid spaces more notable left hemisphere unchanged.  No intracranial mass lesion noted on  this unenhanced exam.  Major intracranial vascular structures are patent.  IMPRESSION: Sequences are motion degraded.  No acute infarct.  No intracranial hemorrhage.  Mild to moderate small vessel disease type changes including that involving the pons with minimal progression since prior exam.  Global atrophy without hydrocephalus.   Electronically Signed   By: Bridgett Larsson M.D.   On: 03/25/2013 14:44    EKG Interpretation    Date/Time:  Wednesday March 25 2013 13:02:37 EST Ventricular Rate:  53 PR Interval:  161 QRS Duration: 92 QT Interval:  409 QTC Calculation: 384  R Axis:   50 Text Interpretation:  Sinus rhythm Borderline repolarization abnormality No significant change since last tracing Confirmed by Palyn Scrima  MD-J, Netty Sullivant ([id number]) on 03/25/2013 1:28:46 PM            MDM   1. Bell's palsy     Symptoms concerning for stroke versus a peripheral facial palsy. There does seem to be some involvement of the forehead but it is not complete and I am not entirely certain. i PLAN TO mri  The brain to rule out a stroke.   MRI is negative.  Will treat with steroids for bells palsy   Celene Kras, MD 03/25/13 1537
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
## 2013-03-25 NOTE — ED Notes (Signed)
Pt from home  right facial droop starting last pm, and  Weakness. Alert and oriented to personand place but stated October for month.

## 2013-04-09 ENCOUNTER — Encounter: Payer: Self-pay | Admitting: Internal Medicine

## 2013-04-09 ENCOUNTER — Ambulatory Visit (HOSPITAL_BASED_OUTPATIENT_CLINIC_OR_DEPARTMENT_OTHER): Payer: Medicare Other | Admitting: Internal Medicine

## 2013-04-09 ENCOUNTER — Ambulatory Visit: Payer: Medicare Other | Admitting: Internal Medicine

## 2013-04-09 ENCOUNTER — Other Ambulatory Visit (INDEPENDENT_AMBULATORY_CARE_PROVIDER_SITE_OTHER): Payer: Medicare Other

## 2013-04-09 ENCOUNTER — Ambulatory Visit (INDEPENDENT_AMBULATORY_CARE_PROVIDER_SITE_OTHER): Payer: Medicare Other | Admitting: Internal Medicine

## 2013-04-09 ENCOUNTER — Telehealth: Payer: Self-pay | Admitting: Internal Medicine

## 2013-04-09 ENCOUNTER — Ambulatory Visit: Payer: Medicare Other

## 2013-04-09 ENCOUNTER — Other Ambulatory Visit: Payer: Medicare Other | Admitting: Lab

## 2013-04-09 VITALS — BP 208/87 | HR 66 | Temp 98.4°F | Resp 18 | Ht 69.0 in | Wt 219.3 lb

## 2013-04-09 VITALS — BP 180/90 | HR 88 | Temp 98.7°F | Resp 16 | Wt 221.0 lb

## 2013-04-09 DIAGNOSIS — M519 Unspecified thoracic, thoracolumbar and lumbosacral intervertebral disc disorder: Secondary | ICD-10-CM

## 2013-04-09 DIAGNOSIS — M79609 Pain in unspecified limb: Secondary | ICD-10-CM

## 2013-04-09 DIAGNOSIS — D72829 Elevated white blood cell count, unspecified: Secondary | ICD-10-CM

## 2013-04-09 DIAGNOSIS — N289 Disorder of kidney and ureter, unspecified: Secondary | ICD-10-CM

## 2013-04-09 DIAGNOSIS — Z1211 Encounter for screening for malignant neoplasm of colon: Secondary | ICD-10-CM

## 2013-04-09 DIAGNOSIS — R7309 Other abnormal glucose: Secondary | ICD-10-CM

## 2013-04-09 DIAGNOSIS — M545 Low back pain, unspecified: Secondary | ICD-10-CM

## 2013-04-09 DIAGNOSIS — I2510 Atherosclerotic heart disease of native coronary artery without angina pectoris: Secondary | ICD-10-CM

## 2013-04-09 DIAGNOSIS — G510 Bell's palsy: Secondary | ICD-10-CM

## 2013-04-09 DIAGNOSIS — I10 Essential (primary) hypertension: Secondary | ICD-10-CM

## 2013-04-09 DIAGNOSIS — R202 Paresthesia of skin: Secondary | ICD-10-CM

## 2013-04-09 DIAGNOSIS — E785 Hyperlipidemia, unspecified: Secondary | ICD-10-CM

## 2013-04-09 DIAGNOSIS — J449 Chronic obstructive pulmonary disease, unspecified: Secondary | ICD-10-CM

## 2013-04-09 DIAGNOSIS — L500 Allergic urticaria: Secondary | ICD-10-CM

## 2013-04-09 DIAGNOSIS — G8929 Other chronic pain: Secondary | ICD-10-CM

## 2013-04-09 DIAGNOSIS — F17200 Nicotine dependence, unspecified, uncomplicated: Secondary | ICD-10-CM

## 2013-04-09 DIAGNOSIS — M169 Osteoarthritis of hip, unspecified: Secondary | ICD-10-CM

## 2013-04-09 LAB — LIPID PANEL
Cholesterol: 156 mg/dL (ref 0–200)
HDL: 39.5 mg/dL (ref >39.00)
LDL Cholesterol: 79 mg/dL (ref 0–99)
Total CHOL/HDL Ratio: 4
Triglycerides: 187 mg/dL — ABNORMAL HIGH (ref 0.0–149.0)
VLDL: 37.4 mg/dL (ref 0.0–40.0)

## 2013-04-09 LAB — BASIC METABOLIC PANEL
BUN: 11 mg/dL (ref 6–23)
CO2: 29 mEq/L (ref 19–32)
Calcium: 8.9 mg/dL (ref 8.4–10.5)
Chloride: 103 mEq/L (ref 96–112)
Creatinine, Ser: 1 mg/dL (ref 0.4–1.5)
GFR: 90.3 mL/min (ref >60.00)
Glucose, Bld: 151 mg/dL — ABNORMAL HIGH (ref 70–99)
Potassium: 3.7 mEq/L (ref 3.5–5.1)
Sodium: 138 mEq/L (ref 135–145)

## 2013-04-09 LAB — HM DIABETES FOOT EXAM

## 2013-04-09 LAB — HEMOGLOBIN A1C: Hgb A1c MFr Bld: 6.7 % — ABNORMAL HIGH (ref 4.6–6.5)

## 2013-04-09 MED ORDER — OLMESARTAN MEDOXOMIL-HCTZ 40-12.5 MG PO TABS: 1.0000 | ORAL_TABLET | Freq: Every day | ORAL | Status: DC

## 2013-04-09 MED ORDER — AMLODIPINE BESYLATE 5 MG PO TABS: 5.0000 mg | ORAL_TABLET | Freq: Every day | ORAL | Status: DC

## 2013-04-09 MED ORDER — ATORVASTATIN CALCIUM 10 MG PO TABS: 10.0000 mg | ORAL_TABLET | Freq: Every day | ORAL | Status: DC

## 2013-04-09 NOTE — Assessment & Plan Note (Signed)
His BP is not well controlled so I have asked him to add Benicar-HCT to the amlodipine

## 2013-04-09 NOTE — Patient Instructions (Signed)

## 2013-04-09 NOTE — Assessment & Plan Note (Signed)
He will work on his lifestyle modifications He does not need a medication yet

## 2013-04-09 NOTE — Assessment & Plan Note (Signed)
He needs the risk reduction of a statin so I have asked him to start atorvastatin

## 2013-04-09 NOTE — Progress Notes (Signed)
Pre visit review using our clinic review tool, if applicable. No additional management support is needed unless otherwise documented below in the visit note. 

## 2013-04-09 NOTE — Progress Notes (Signed)
Subjective:    Patient ID: Adrian Miller, male    DOB: 1940-06-17, 72 y.o.   MRN: [id number]  Hypertension This is a chronic problem. The current episode started more than 1 year ago. The problem has been gradually worsening since onset. The problem is uncontrolled. Pertinent negatives include no anxiety, blurred vision, chest pain, headaches, malaise/fatigue, neck pain, orthopnea, palpitations, peripheral edema, PND, shortness of breath or sweats. Past treatments include calcium channel blockers. The current treatment provides mild improvement. Compliance problems include exercise and diet.  Hypertensive end-organ damage includes kidney disease. Identifiable causes of hypertension include chronic renal disease.      Review of Systems  Constitutional: Negative.  Negative for fever, chills, malaise/fatigue, diaphoresis, appetite change and fatigue.  HENT: Negative.   Eyes: Negative.  Negative for blurred vision.  Respiratory: Negative.  Negative for cough, choking, chest tightness, shortness of breath, wheezing and stridor.   Cardiovascular: Negative.  Negative for chest pain, palpitations, orthopnea, leg swelling and PND.  Gastrointestinal: Negative.  Negative for abdominal pain, diarrhea, constipation, blood in stool and rectal pain.  Endocrine: Negative.   Genitourinary: Negative.   Musculoskeletal: Negative.  Negative for arthralgias, neck pain and neck stiffness.  Skin: Negative.   Allergic/Immunologic: Negative.   Neurological: Positive for facial asymmetry. Negative for dizziness, tremors, syncope, weakness, light-headedness and headaches.  Hematological: Negative.  Negative for adenopathy. Does not bruise/bleed easily.  Psychiatric/Behavioral: Negative.        Objective:   Physical Exam  Vitals reviewed. Constitutional: He is oriented to person, place, and time. He appears well-developed and well-nourished. No distress.  HENT:  Head: Normocephalic and atraumatic.    Mouth/Throat: Oropharynx is clear and moist. No oropharyngeal exudate.  Eyes: Conjunctivae are normal. Right eye exhibits no discharge. Left eye exhibits no discharge. No scleral icterus.  Neck: Normal range of motion. Neck supple. No JVD present. No tracheal deviation present. No thyromegaly present.  Cardiovascular: Normal rate, regular rhythm, normal heart sounds and intact distal pulses.  Exam reveals no gallop and no friction rub.   No murmur heard. Pulmonary/Chest: Effort normal and breath sounds normal. No stridor. No respiratory distress. He has no wheezes. He has no rales. He exhibits no tenderness.  Abdominal: Soft. Bowel sounds are normal. He exhibits no distension and no mass. There is no tenderness. There is no rebound and no guarding.  Musculoskeletal: Normal range of motion. He exhibits no edema and no tenderness.  Lymphadenopathy:    He has no cervical adenopathy.  Neurological: He is alert and oriented to person, place, and time. He has normal strength. He displays no atrophy, no tremor and normal reflexes. A cranial nerve deficit (left peripheral facial palsy) is present. No sensory deficit. He exhibits normal muscle tone. He displays a negative Romberg sign. He displays no seizure activity. Coordination and gait normal.  Reflex Scores:      Tricep reflexes are 1+ on the right side and 1+ on the left side.      Bicep reflexes are 1+ on the right side and 1+ on the left side.      Brachioradialis reflexes are 1+ on the right side and 1+ on the left side.      Patellar reflexes are 1+ on the right side and 1+ on the left side.      Achilles reflexes are 1+ on the right side and 1+ on the left side. Skin: Skin is warm and dry. No rash noted. He is not diaphoretic. No erythema.  No pallor.     Lab Results  Component Value Date   WBC 8.5 03/25/2013   HGB 17.7* 03/25/2013   HCT 52.0 03/25/2013   PLT 329 03/25/2013   GLUCOSE 136* 03/25/2013   CHOL 94 11/04/2010   TRIG 78  11/04/2010   HDL 30* 11/04/2010   LDLCALC 48 11/04/2010   ALT 24 03/25/2013   AST 29 03/25/2013   NA 143 03/25/2013   K 3.7 03/25/2013   CL 105 03/25/2013   CREATININE 1.20 03/25/2013   BUN 10 03/25/2013   CO2 24 03/25/2013   TSH 1.24 03/06/2013   INR 1.01 03/25/2013   HGBA1C 6.0* 11/04/2010       Assessment & Plan:

## 2013-04-09 NOTE — Assessment & Plan Note (Signed)
I have asked him to f/up with cardiology 

## 2013-04-09 NOTE — Patient Instructions (Signed)
Bell's Palsy Bell's palsy is a condition in which the muscles on one side of the face cannot move (paralysis). This is because the nerves in the face are paralyzed. It is most often thought to be caused by a virus. The virus causes swelling of the nerve that controls movement on one side of the face. The nerve travels through a tight space surrounded by bone. When the nerve swells, it can be compressed by the bone. This results in damage to the protective covering around the nerve. This damage interferes with how the nerve communicates with the muscles of the face. As a result, it can cause weakness or paralysis of the facial muscles.  Injury (trauma), tumor, and surgery may cause Bell's palsy, but most of the time the cause is unknown. It is a relatively common condition. It starts suddenly (abrupt onset) with the paralysis usually ending within 2 days. Bell's palsy is not dangerous. But because the eye does not close properly, you may need care to keep the eye from getting dry. This can include splinting (to keep the eye shut) or moistening with artificial tears. Bell's palsy very seldom occurs on both sides of the face at the same time. SYMPTOMS   Eyebrow sagging.  Drooping of the eyelid and corner of the mouth.  Inability to close one eye.  Loss of taste on the front of the tongue.  Sensitivity to loud noises. TREATMENT  The treatment is usually non-surgical. If the patient is seen within the first 24 to 48 hours, a short course of steroids may be prescribed, in an attempt to shorten the length of the condition. Antiviral medicines may also be used with the steroids, but it is unclear if they are helpful.  You will need to protect your eye, if you cannot close it. The cornea (clear covering over your eye) will become dry and can be damaged. Artificial tears can be used to keep your eye moist. Glasses or an eye patch should be worn to protect your eye. PROGNOSIS  Recovery is variable, ranging  from days to months. Although the problem usually goes away completely (about 80% of cases resolve), predicting the outcome is impossible. Most people improve within 3 weeks of when the symptoms began. Improvement may continue for 3 to 6 months. A small number of people have moderate to severe weakness that is permanent.  HOME CARE INSTRUCTIONS   If your caregiver prescribed medication to reduce swelling in the nerve, use as directed. Do not stop taking the medication unless directed by your caregiver.  Use moisturizing eye drops as needed to prevent drying of your eye, as directed by your caregiver.  Protect your eye, as directed by your caregiver.  Use facial massage and exercises, as directed by your caregiver.  Perform your normal activities, and get your normal rest. SEEK IMMEDIATE MEDICAL CARE IF:   There is pain, redness or irritation in the eye.  You or your child has an oral temperature above 102 F (38.9 C), not controlled by medicine. MAKE SURE YOU:   Understand these instructions.  Will watch your condition.  Will get help right away if you are not doing well or get worse. Document Released: 04/23/2005 Document Revised: 07/16/2011 Document Reviewed: 05/02/2009 [facility] Patient Information 2014 [place], [place].  Colorectal Cancer Screening Colorectal cancer screening is done to detect early disease. Colorectal refers to the colon and rectum. The colon and rectum are located at the end of the large intestine (digestive system), and carry your  bowel movements out of the body. Screening may be done even if you are not experiencing symptoms.  Colorectal cancer screening checks for:  Polyps. These are small growths in the lining of the colon that can turn cancerous.  Cancer that is already growing. Cancer is a cluster of abnormal cells that can cause problems in the body. REASONS FOR COLORECTAL CANCER SCREENING  It is common for polyps to form in the lining of the colon,  especially in older people. These polyps can be cancerous or become cancerous.  Caught early, colorectal cancer is treatable.  Cancer can be life threatening. Detecting or preventing cancer early can save your life and allow you to enjoy life longer. TYPES OF SCREENING  Fecal occult blood testing. A stool sample is examined for blood in the laboratory.  Sigmoidoscopy. A sigmoidoscope is used to examine the rectum and lower colon. A sigmoidoscope is a flexible tube with a camera that is inserted through your anus to examine your lower rectum.  Colonoscopy. The longer colonoscope is used to examine the entire colon. A colonoscope is also a thin, flexible tube with a camera. This test examines the colon and rectum. Other tests include:  Digital rectal exam.  Barium enema.  Stool DNA test.  Virtual colonoscopy is the use of computerized X-ray scan (computed tomography, CT) to take X-ray images of your colon. WHO SHOULD HAVE COLORECTAL CANCER SCREENING?  Screening is recommended for all adults aged 7 to 75 years.  Screening is generally done every 5 to 10 years or more frequently if you have a family history or symptoms.  Screening is rarely recommended in adults aged 2 to 85 years. Screening is not recommended in adults aged 43 years and older. Your caregiver may recommend screening at a younger age and more frequent screening if you have:  A history of colorectal cancer or polyps.  Family members with histories of colorectal cancer or polyps.  Inflammatory bowel disease, such as ulcerative colitis or Crohn's disease.  A type of hereditary colon cancer syndrome. Talk with your caregiver about any symptoms, personal and family history. SYMPTOMS OF COLORECTAL CANCER It is important to discuss the following symptoms with your caregiver. These symptoms may be the result of other conditions and may be easily treated:  Rectal bleeding.  Blood in your stool.  Changes in bowel  movements (hard or loose stools). These changes may last several weeks.  Abdominal cramping.  Feeling the pressure to have a bowel movement when there is no bowel movement.  Feeling tired or weak.  Unexplained weight loss.  Unexplained low red blood cell count. This may also be called iron deficiency anemia. HOME CARE INSTRUCTIONS   Follow up with your caregiver as directed.  Follow all instructions for preparation before your test as well as after. PREVENTION  Following healthy lifestyle habits each day can reduce your chance of getting colorectal cancer and many other types of cancer:  Eat a healthy, well-balanced diet rich in fruits and vegetables and low in fats, sugars and cholesterol.  Stay active. Try to exercise at least 4 to 6 times per week for 30 minutes.  Maintain a healthy weight. Ask your caregiver what a healthy weight range is for you.  Women should only drink 1 alcoholic drink per day. Men should only drink 2 alcoholic drinks per day.  Quit smoking. SEEK MEDICAL CARE IF:   You experience abdominal or rectal symptoms (see Symptoms of Colorectal Cancer).  Your gastrointestinal issues (constipation, diarrhea) do  not go away as expected.  You have questions or concerns. FOR MORE INFORMATION  American Academy of Family Physicians www.familydoctor.org  Centers for Disease Control and Prevention FootballEx[employer].com.br  [place] Preventive Services Task Force www.uspreventiveservicestaskforce.org  American Cancer Society www.cancer.org MAKE SURE YOU:   Understand these instructions.  Will watch your condition.  Will get help right away if you are not doing well or get worse. Always follow up with your caregiver to find out the results of your tests. Not all test results may be available during your visit. If your test results are not back during the visit, make an appointment with your caregiver to find out the results. Do not assume everything is normal if you have not  heard from your caregiver or the medical facility. It is important for you to follow up on all of your test results.  Document Released: 10/11/2009 Document Revised: 07/16/2011 Document Reviewed: 10/11/2009 [facility] Patient Information 2014 [place], [place]. Amlodipine tablets What is this medicine? AMLODIPINE (am LOE di peen) is a calcium-channel blocker. It affects the amount of calcium found in your heart and muscle cells. This relaxes your blood vessels, which can reduce the amount of work the heart has to do. This medicine is used to lower high blood pressure. It is also used to prevent chest pain. This medicine may be used for other purposes; ask your health care provider or pharmacist if you have questions. COMMON BRAND NAME(S): Norvasc What should I tell my health care provider before I take this medicine? They need to know if you have any of these conditions: -heart problems like heart failure or aortic stenosis -liver disease -an unusual or allergic reaction to amlodipine, other medicines, foods, dyes, or preservatives -pregnant or trying to get pregnant -breast-feeding How should I use this medicine? Take this medicine by mouth with a glass of water. Follow the directions on the prescription label. Take your medicine at regular intervals. Do not take more medicine than directed. Talk to your pediatrician regarding the use of this medicine in children. Special care may be needed. This medicine has been used in children as young as 6. Persons over 25 years old may have a stronger reaction to this medicine and need smaller doses. Overdosage: If you think you have taken too much of this medicine contact a poison control center or emergency room at once. NOTE: This medicine is only for you. Do not share this medicine with others. What if I miss a dose? If you miss a dose, take it as soon as you can. If it is almost time for your next dose, take only that dose. Do not take double or extra  doses. What may interact with this medicine? -herbal or dietary supplements -local or general anesthetics -medicines for high blood pressure -medicines for prostate problems -rifampin This list may not describe all possible interactions. Give your health care provider a list of all the medicines, herbs, non-prescription drugs, or dietary supplements you use. Also tell them if you smoke, drink alcohol, or use illegal drugs. Some items may interact with your medicine. What should I watch for while using this medicine? Visit your doctor or health care professional for regular check ups. Check your blood pressure and pulse rate regularly. Ask your health care professional what your blood pressure and pulse rate should be, and when you should contact him or her. This medicine may make you feel confused, dizzy or lightheaded. Do not drive, use machinery, or do anything that needs mental alertness  until you know how this medicine affects you. To reduce the risk of dizzy or fainting spells, do not sit or stand up quickly, especially if you are an older patient. Avoid alcoholic drinks; they can make you more dizzy. Do not suddenly stop taking amlodipine. Ask your doctor or health care professional how you can gradually reduce the dose. What side effects may I notice from receiving this medicine? Side effects that you should report to your doctor or health care professional as soon as possible: -allergic reactions like skin rash, itching or hives, swelling of the face, lips, or tongue -breathing problems -changes in vision or hearing -chest pain -fast, irregular heartbeat -swelling of legs or ankles Side effects that usually do not require medical attention (report to your doctor or health care professional if they continue or are bothersome): -dry mouth -facial flushing -nausea, vomiting -stomach gas, pain -tired, weak -trouble sleeping This list may not describe all possible side effects. Call  your doctor for medical advice about side effects. You may report side effects to FDA at 1-800-FDA-1088. Where should I keep my medicine? Keep out of the reach of children. Store at room temperature between 59 and 86 degrees F (15 and 30 degrees C). Protect from light. Keep container tightly closed. Throw away any unused medicine after the expiration date. NOTE: This sheet is a summary. It may not cover all possible information. If you have questions about this medicine, talk to your doctor, pharmacist, or health care provider.  2014, Elsevier/Gold Standard. (2012-03-21 11:40:58) Leukocytosis Leukocytosis means you have more white blood cells than normal. White blood cells are made in your bone marrow. The main job of white blood cells is to fight infection. Having too many white blood cells is a common condition. It can develop as a result of many types of medical problems. CAUSES  In some cases, your bone marrow may be normal, but it is still making too many white blood cells. This could be the result of:  Infection.  Injury.  Physical stress.  Emotional stress.  Surgery.  Allergic reactions.  Tumors that do not start in the blood or bone marrow.  An inherited disease.  Certain medicines.  Pregnancy and labor. In other cases, you may have a bone marrow disorder that is causing your body to make too many white blood cells. Bone marrow disorders include:  Leukemia. This is a type of blood cancer.  Myeloproliferative disorders. These disorders cause blood cells to grow abnormally. SYMPTOMS  Some people have no symptoms. Others have symptoms due to the medical problem that is causing their leukocytosis. These symptoms may include:  Bleeding.  Bruising.  Fever.  Night sweats.  Repeated infections.  Weakness.  Weight loss. DIAGNOSIS  Leukocytosis is often found during blood tests that are done as part of a normal physical exam. Your caregiver will probably order other  tests to help determine why you have too many white blood cells. These tests may include:  A complete blood count (CBC). This test measures all the types of blood cells in your body.  Chest X-rays, urine tests (urinalysis), or other tests to look for signs of infection.  Bone marrow aspiration. For this test, a needle is put into your bone. Cells from the bone marrow are removed through the needle. The cells are then examined under a microscope. TREATMENT  Treatment is usually not needed for leukocytosis. However, if a disorder is causing your leukocytosis, it will need to be treated. Treatment may include:  Antibiotic medicines if you have a bacterial infection.  Bone marrow transplant. Your diseased bone marrow is replaced with healthy cells that will grow new bone marrow.  Chemotherapy. This is the use of drugs to kill cancer cells. HOME CARE INSTRUCTIONS  Only take over-the-counter or prescription medicines as directed by your caregiver.  Maintain a healthy weight. Ask your caregiver what weight is best for you.  Eat foods that are low in saturated fats and high in fiber. Eat plenty of fruits and vegetables.  Drink enough fluids to keep your urine clear or pale yellow.  Get 30 minutes of exercise at least 5 times a week. Check with your caregiver before starting a new exercise routine.  Limit caffeine and alcohol.  Do not smoke.  Keep all follow-up appointments as directed by your caregiver. SEEK MEDICAL CARE IF:  You feel weak or more tired than usual.  You develop chills, a cough, or nasal congestion.  You lose weight without trying.  You have night sweats.  You bruise easily. SEEK IMMEDIATE MEDICAL CARE IF:  You bleed more than normal.  You have chest pain.  You have trouble breathing.  You have a fever.  You have uncontrolled nausea or vomiting.  You feel dizzy or lightheaded. MAKE SURE YOU:  Understand these instructions.  Will watch your  condition.  Will get help right away if you are not doing well or get worse. Document Released: 04/12/2011 Document Revised: 07/16/2011 Document Reviewed: 04/12/2011 [facility] Patient Information 2014 [place], [place].

## 2013-04-09 NOTE — Assessment & Plan Note (Signed)
He tells me that he has been treated in the ER and by an eye doctor and a neurologist for this

## 2013-04-09 NOTE — Telephone Encounter (Signed)
appts made per 12/4 POF Appt for referral to GI and screening colonoscopy made AVS and CAL printed shh

## 2013-04-09 NOTE — Telephone Encounter (Signed)
C/D 04/09/13 for appt. 04/09/13

## 2013-04-12 NOTE — Progress Notes (Signed)
Adrian Miller [phone]   [phone]  NEW PATIENT EVALUATION   Name: Adrian Miller Date: 04/12/2013 MRN: [id number] DOB: [birth year]  PCP: Sanda Linger, MD   REFERRING PHYSICIAN: Etta Grandchild, MD  REASON FOR REFERRAL: Leukocytosis, mild   HISTORY OF PRESENT ILLNESS:Adrian Miller is a 72 y.o. male with a history as noted below including a recent history of Bell's palsy is here for evaluation of his mild leukocytosis.  He is a reliable historian.  He notes that two weeks ago he was diagnosed with Bell's palsy.  Additional neurological work-up was negative (see MRI of Brain below).  He is finishing steroids with some resolution.  He still has persitence in left eyelid drag with dry eye and left facial droop.  Otherwise, he denies any additional constitutional symptoms.  He was seen by Dr. Sanda Linger on 03/06/2013 secondary to intermittent swelling of his lips, eyelids over the past one month.  He also reported itchy whelps that came and went over his torso, arms and legs associated with aspirin use.  His recent includes including a CBC obtained on 03/25/2013 revealed a WBC of 8.5, Hemoglobin of 15.1, Platelets of 329; One month ago (03/06/2013), WBC of 8.5, Hemoglobin of 15.3, Platelets of 331;  Three months ago (12/16/2012), WBC of 11.5; Hemoglobin of 15.2; Platelets of 313; Five months ago (10/29/2012) with WBC of 13.5; Hemoglobin of 14.8 and Platelets of 302; and finally one year ago (02/05/2012) WB of 16.8; Hemoglobin of 16.3 and Platelets of 232.  Differential from one year ago showed elevated neutrophils.  He has a normal creatinine and liver function test recently. He reports finishing the steroid pack for his Bell's palsy and receiving back injections every few months for severe back pain by Dr. Regino Schultze.  He is a 1/2 pack smoker for many years with a notable history of COPD.    PAST MEDICAL HISTORY:  has a past medical history of Chronic headaches; Arthritis;  Lumbar disc disease; Hypertension; Meningitis (10/2010); COPD (chronic obstructive pulmonary disease); Hyperlipidemia; Chronic LBP; and BPH (benign prostatic hypertrophy).     PAST SURGICAL HISTORY: Past Surgical History  Procedure Laterality Date  . Left rotator cuff repair  2002  . Hernia repair  1975  . Replacement total knee       CURRENT MEDICATIONS: has a current medication list which includes the following prescription(s): amlodipine, hydrocodone-acetaminophen, atorvastatin, and olmesartan-hydrochlorothiazide.   ALLERGIES: Asa   SOCIAL HISTORY:  reports that he has been smoking Cigarettes.  He has a 12.75 pack-year smoking history. He has never used smokeless tobacco. He reports that he does not drink alcohol or use illicit drugs.   FAMILY HISTORY: Mother deceased at age 63 for natural causes; Father, deceased at age 42 from natural causes.  Sister deceased of MI at age 25.    LABORATORY DATA:  CBC    Component Value Date/Time   WBC 8.5 03/25/2013 1301   WBC 20.2* 02/05/2012 1951   RBC 5.16 03/25/2013 1301   RBC 5.79 02/05/2012 1951   HGB 17.7* 03/25/2013 1312   HGB 17.7 02/05/2012 1951   HCT 52.0 03/25/2013 1312   HCT 54.5* 02/05/2012 1951   PLT 329 03/25/2013 1301   MCV 87.4 03/25/2013 1301   MCV 94.2 02/05/2012 1951   MCH 29.3 03/25/2013 1301   MCH 30.6 02/05/2012 1951   MCHC 33.5 03/25/2013 1301   MCHC 32.5 02/05/2012 1951   RDW 14.4 03/25/2013 1301   LYMPHSABS 2.7  03/25/2013 1301   MONOABS 0.5 03/25/2013 1301   EOSABS 0.1 03/25/2013 1301   BASOSABS 0.0 03/25/2013 1301    CMP     Component Value Date/Time   NA 138 04/09/2013 1445   K 3.7 04/09/2013 1445   CL 103 04/09/2013 1445   CO2 29 04/09/2013 1445   GLUCOSE 151* 04/09/2013 1445   BUN 11 04/09/2013 1445   CREATININE 1.0 04/09/2013 1445   CALCIUM 8.9 04/09/2013 1445   PROT 8.3 03/25/2013 1301   ALBUMIN 4.2 03/25/2013 1301   AST 29 03/25/2013 1301   ALT 24 03/25/2013 1301   ALKPHOS 119* 03/25/2013 1301    BILITOT 0.4 03/25/2013 1301   GFRNONAA 73* 03/25/2013 1301   GFRAA 84* 03/25/2013 1301    RADIOGRAPHY: Mr Brain Wo Contrast  03/25/2013   CLINICAL DATA:  Right-sided facial droop, weakness and slurred speech. History of high blood pressure hyperlipidemia.  EXAM: MRI HEAD WITHOUT CONTRAST  TECHNIQUE: Multiplanar, multiecho pulse sequences of the brain and surrounding structures were obtained without intravenous contrast.  COMPARISON:  10/29/2012 head CT.  11/04/2010 brain MR.  FINDINGS: Sequences are motion degraded.  No acute infarct.  No intracranial hemorrhage.  Mild to moderate small vessel disease type changes including that involving the pons with minimal progression since prior exam.  Global atrophy without hydrocephalus. Slightly asymmetric appearance of the subarachnoid spaces more notable left hemisphere unchanged.  No intracranial mass lesion noted on this unenhanced exam.  Major intracranial vascular structures are patent.  IMPRESSION: Sequences are motion degraded.  No acute infarct.  No intracranial hemorrhage.  Mild to moderate small vessel disease type changes including that involving the pons with minimal progression since prior exam.  Global atrophy without hydrocephalus.   Electronically Signed   By: Bridgett Larsson M.D.   On: 03/25/2013 14:44    REVIEW OF SYSTEMS:  Constitutional: Denies fevers, chills or abnormal weight loss; + L knee pain that is chronic Eyes: Denies blurriness of vision; + dry eye Ears, nose, mouth, throat, and face: Denies mucositis or sore throat Respiratory: Denies cough, dyspnea or wheezes Cardiovascular: Denies palpitation, chest discomfort or lower extremity swelling Gastrointestinal:  Denies nausea, heartburn or change in bowel habits Skin: Denies abnormal skin rashes Lymphatics: Denies new lymphadenopathy or easy bruising Neurological:Denies numbness, tingling or new weaknesses Behavioral/Psych: Mood is stable, no new changes  All other systems were  reviewed with the patient and are negative.  PHYSICAL EXAM:  height is 5\' 9"  (1.753 m) and weight is 219 lb 4.8 oz (99.474 kg). His oral temperature is 98.4 F (36.9 C). His blood pressure is 208/87 and his pulse is 66. His respiration is 18.    GENERAL:alert, no distress and comfortable; well developed and well nourished.  SKIN: skin color, texture, turgor are normal, no rashes or significant lesions EYES: normal, Conjunctiva are pink and non-injected, sclera clear; L eye lid droop.  L facial drop.  OROPHARYNX:no exudate, no erythema and lips, buccal mucosa, and tongue normal  NECK: supple, thyroid normal size, non-tender, without nodularity LYMPH:  no palpable lymphadenopathy in the cervical, axillary or inguinal LUNGS: clear to auscultation and percussion with normal breathing effort HEART: regular rate & rhythm and no murmurs and no lower extremity edema ABDOMEN:abdomen soft, non-tender and normal bowel sounds Musculoskeletal:no cyanosis of digits and no clubbing  NEURO: alert & oriented x 3 with fluent speech, no focal motor/sensory deficits   IMPRESSION: Adrian Miller is a 72 y.o. male with a history of COPD/Tobacco abuse,  Bell's palsy, and mild leukocytosis.  PLAN: 1.  Leukocytosis, likely secondary smoking versus medication. -- We discussed that we will repeat his labs following his receipt of steroids for #2.   Unlikely, to be a primary bone marrow problem given normal hemoglobin and plts.   If trend persists with increases, we will consider further hematological work up.  CLL less likely given normal lymphocytes.  We will review his peripheral smear.  In addition, we discussed that smoking causes mild leukocytosis.     2. Bell's Palsy. --Patient had questions regarding this diagnosis.  We provided him a detail handout along with reassurance that in 80% of cases that it remit.    3. COPD/Tobacco Abuse. --We counseled Adrian Miller on the benefits of quitting.  He did not request  referral to tobacco cessation presently.   4. Follow-up. --Follow up for repeat labs in a few weeks off steroids and with resolution of #2.   He was referred to GI for screening colonoscopy.   RTC in one month.   All questions were answered. The patient knows to call the clinic with any problems, questions or concerns. We can certainly see the patient much sooner if necessary.  I spent 25 minutes counseling the patient face to face. The total time spent in the appointment was 45 minutes.    Suan Pyeatt, Bar[place], MD 04/12/2013 11:00 PM

## 2013-04-14 ENCOUNTER — Ambulatory Visit: Payer: Medicare Other | Admitting: Internal Medicine

## 2013-04-15 ENCOUNTER — Encounter: Payer: Self-pay | Admitting: *Deleted

## 2013-04-16 ENCOUNTER — Other Ambulatory Visit: Payer: Medicare Other

## 2013-04-17 ENCOUNTER — Ambulatory Visit: Payer: Medicare Other | Admitting: Cardiology

## 2013-04-23 ENCOUNTER — Encounter: Payer: Medicare Other | Admitting: Internal Medicine

## 2013-05-18 ENCOUNTER — Other Ambulatory Visit: Payer: Self-pay | Admitting: Medical Oncology

## 2013-05-18 ENCOUNTER — Other Ambulatory Visit: Payer: Medicare Other

## 2013-05-25 ENCOUNTER — Emergency Department (HOSPITAL_COMMUNITY)
Admission: EM | Admit: 2013-05-25 | Discharge: 2013-05-25 | Disposition: A | Discharge status: Home / Self Care | Payer: Medicare Other | Attending: Emergency Medicine | Admitting: Emergency Medicine

## 2013-05-25 ENCOUNTER — Encounter (HOSPITAL_COMMUNITY): Payer: Self-pay | Admitting: Emergency Medicine

## 2013-05-25 DIAGNOSIS — E785 Hyperlipidemia, unspecified: Secondary | ICD-10-CM | POA: Insufficient documentation

## 2013-05-25 DIAGNOSIS — J4489 Other specified chronic obstructive pulmonary disease: Secondary | ICD-10-CM | POA: Insufficient documentation

## 2013-05-25 DIAGNOSIS — Z79899 Other long term (current) drug therapy: Secondary | ICD-10-CM | POA: Insufficient documentation

## 2013-05-25 DIAGNOSIS — M129 Arthropathy, unspecified: Secondary | ICD-10-CM | POA: Insufficient documentation

## 2013-05-25 DIAGNOSIS — H9312 Tinnitus, left ear: Secondary | ICD-10-CM

## 2013-05-25 DIAGNOSIS — Z87448 Personal history of other diseases of urinary system: Secondary | ICD-10-CM | POA: Insufficient documentation

## 2013-05-25 DIAGNOSIS — J449 Chronic obstructive pulmonary disease, unspecified: Secondary | ICD-10-CM | POA: Insufficient documentation

## 2013-05-25 DIAGNOSIS — H9319 Tinnitus, unspecified ear: Secondary | ICD-10-CM | POA: Insufficient documentation

## 2013-05-25 DIAGNOSIS — F17200 Nicotine dependence, unspecified, uncomplicated: Secondary | ICD-10-CM | POA: Insufficient documentation

## 2013-05-25 DIAGNOSIS — G8929 Other chronic pain: Secondary | ICD-10-CM | POA: Insufficient documentation

## 2013-05-25 DIAGNOSIS — I10 Essential (primary) hypertension: Secondary | ICD-10-CM | POA: Insufficient documentation

## 2013-05-25 NOTE — ED Provider Notes (Signed)
Medical screening examination/treatment/procedure(s) were performed by non-physician practitioner and as supervising physician I was immediately available for consultation/collaboration.  EKG Interpretation   None        Threasa Beards, MD 05/25/13 1331

## 2013-05-25 NOTE — ED Provider Notes (Signed)
CSN: [id number]     Arrival date & time 05/25/13  1141 History  This chart was scribed for non-physician practitioner, Clayton Bibles, PA-C working with Threasa Beards, MD by Frederich Balding, ED scribe. This patient was seen in [facility] [place] and the patient's care was started at 12:47 PM.  Chief Complaint  Patient presents with  . Tinnitus    x 3 weeks   The history is provided by the patient. No language interpreter was used.   HPI Comments: Adrian Miller is a 73 y.o. male who presents to the Emergency Department complaining of gradual onset, constant tinnitus that started 3 weeks ago. Denies ear pain, discharge, or change in hearing. Denies injury. States it sounds like a motor in his ear. He has been cleaning his ear out with Q-tips and alcohol. Pt states he was diagnosed with Bell's Palsy about one month ago and this has resolved. Denies fever, ear pain, rhinorrhea, congestion, sore throat, SOB, cough, chest pain.   Denies any residual facial weakness or numbness.    Past Medical History  Diagnosis Date  . Chronic headaches   . Arthritis   . Lumbar disc disease   . Hypertension   . Meningitis 10/2010    related to molart abscess   . COPD (chronic obstructive pulmonary disease)   . Hyperlipidemia   . Chronic LBP   . BPH (benign prostatic hypertrophy)    Past Surgical History  Procedure Laterality Date  . Left rotator cuff repair  2002  . Hernia repair  1975  . Replacement total knee     No family history on file. History  Substance Use Topics  . Smoking status: Current Every Day Smoker -- 0.25 packs/day for 51 years    Types: Cigarettes  . Smokeless tobacco: Never Used     Comment: quit 2 weeks ago  . Alcohol Use: No    Review of Systems  Constitutional: Negative for fever.  HENT: Positive for tinnitus. Negative for congestion, ear pain, rhinorrhea and sore throat.   Respiratory: Negative for cough and shortness of breath.   Cardiovascular: Negative for chest pain.  All  other systems reviewed and are negative.    Allergies  Asa  Home Medications   Current Outpatient Rx  Name  Route  Sig  Dispense  Refill  . amLODipine (NORVASC) 5 MG tablet   Oral   Take 1 tablet (5 mg total) by mouth daily.   30 tablet   0   . atorvastatin (LIPITOR) 10 MG tablet   Oral   Take 1 tablet (10 mg total) by mouth daily.   90 tablet   3   . HYDROcodone-acetaminophen (NORCO) 10-325 MG per tablet   Oral   Take 1 tablet by mouth every 6 (six) hours as needed for moderate pain.         [name] olmesartan-hydrochlorothiazide (BENICAR HCT) 40-12.5 MG per tablet   Oral   Take 1 tablet by mouth daily.   70 tablet   0    BP 154/67  Pulse 64  Temp(Src) 98.4 F (36.9 C) (Oral)  Resp 16  SpO2 96%  Physical Exam  Nursing note and vitals reviewed. Constitutional: He appears well-developed and well-nourished. No distress.  HENT:  Head: Normocephalic and atraumatic.  Right Ear: Tympanic membrane and ear canal normal.  Left Ear: Tympanic membrane and ear canal normal.  Mouth/Throat: Uvula is midline and oropharynx is clear and moist. No oropharyngeal exudate, posterior oropharyngeal edema or posterior oropharyngeal  erythema.  No pain with traction of the pinna or palpation of tragus.  No facial droop.    Eyes: Conjunctivae and EOM are normal. Right eye exhibits no discharge. Left eye exhibits no discharge. No scleral icterus.  Neck: Normal range of motion. Neck supple.  Pulmonary/Chest: Effort normal.  Neurological: He is alert.  Skin: No rash noted. He is not diaphoretic.    ED Course  Procedures (including critical care time)  DIAGNOSTIC STUDIES: Oxygen Saturation is 96% on RA, normal by my interpretation.    COORDINATION OF CARE: 12:50 PM-Discussed treatment plan which includes referral to ENT with pt at bedside and pt agreed to plan.   Labs Review Labs Reviewed - No data to display Imaging Review No results found.  EKG Interpretation   None       1:03 PM Discussed patient with Dr Canary Brim.   MDM   1. Tinnitus of left ear    Pt with three weeks of tinnitus of left ear.  He has not been ill and has no infection on exam.  NO change in hearing.  No accumulation of cerumen that might change hearing.  He has had left sided bell's palsy diagnosed in midNovember treated successfully with steroids.  Unclear what has caused his tinnitus.  Pt discussed with Dr Canary Brim.  D/C home with ENT follow up.  Discussed findings, treatment, and follow up  with patient.  Pt given return precautions.  Pt verbalizes understanding and agrees with plan.      I personally performed the services described in this documentation, which was scribed in my presence. The recorded information has been reviewed and is accurate.   Clayton Bibles, PA-C 05/25/13 1321

## 2013-05-25 NOTE — Discharge Instructions (Signed)
Read the information below.  You may return to the Emergency Department at any time for worsening condition or any new symptoms that concern you.  Please follow up with the specialist listed above.  If you develop fevers, ear pain, discharge from your ear, new weakness or numbness in your face, or change in your hearing, return to the ER for a recheck.     Tinnitus Sounds you hear in your ears and coming from within the ear is called tinnitus. This can be a symptom of many ear disorders. It is often associated with hearing loss.  Tinnitus can be seen with:  Infections.  Ear blockages such as wax buildup.  Meniere's disease.  Ear damage.  Inherited.  Occupational causes. While irritating, it is not usually a threat to health. When the cause of the tinnitus is wax, infection in the middle ear, or foreign body it is easily treated. Hearing loss will usually be reversible.  TREATMENT  When treating the underlying cause does not get rid of tinnitus, it may be necessary to get rid of the unwanted sound by covering it up with more pleasant background noises. This may include music, the radio etc. There are tinnitus maskers which can be worn which produce background noise to cover up the tinnitus. Avoid all medications which tend to make tinnitus worse such as alcohol, caffeine, aspirin, and nicotine. There are many soothing background tapes such as rain, ocean, thunderstorms, etc. These soothing sounds help with sleeping or resting. Keep all follow-up appointments and referrals. This is important to identify the cause of the problem. It also helps avoid complications, impaired hearing, disability, or chronic pain. Document Released: 04/23/2005 Document Revised: 07/16/2011 Document Reviewed: 12/10/2007 [facility] Patient Information 2014 [place].

## 2013-05-25 NOTE — ED Notes (Signed)
Pt presents with NAD- Pt reports ringing and pain to left ear x 3 weeks. " sounds like motor running". Used Q-TIP  Without relief Denies any other symptoms. GCS 15

## 2013-06-04 ENCOUNTER — Telehealth: Payer: Self-pay

## 2013-06-04 DIAGNOSIS — I10 Essential (primary) hypertension: Secondary | ICD-10-CM

## 2013-06-04 MED ORDER — AMLODIPINE BESYLATE 5 MG PO TABS
5.0000 mg | ORAL_TABLET | Freq: Every day | ORAL | Status: DC
Start: 2013-06-04 — End: 2013-10-28

## 2013-06-04 MED ORDER — OLMESARTAN MEDOXOMIL-HCTZ 40-12.5 MG PO TABS: 1.0000 | ORAL_TABLET | Freq: Every day | ORAL | Status: DC

## 2013-06-04 NOTE — Telephone Encounter (Signed)
benicar + HCTZ + amlodipine

## 2013-06-04 NOTE — Telephone Encounter (Signed)
I left a message for Ms Adrian Miller that he should be on both blood pressure meds.

## 2013-06-04 NOTE — Telephone Encounter (Signed)
Patient's daughter called. When patient went to the pharmacy to get new BP med he was given the old one. Is patient to be on Benicar instead of Amlodipine?

## 2013-06-08 ENCOUNTER — Ambulatory Visit: Payer: Medicare Other | Admitting: Cardiology

## 2013-10-08 ENCOUNTER — Other Ambulatory Visit: Payer: Self-pay | Admitting: Medical Oncology

## 2013-10-08 ENCOUNTER — Ambulatory Visit: Payer: Medicare Other

## 2013-10-12 ENCOUNTER — Telehealth: Payer: Self-pay | Admitting: Internal Medicine

## 2013-10-12 NOTE — Telephone Encounter (Signed)
called pt re appt for 6/23. appt r/s from last week. per pt he has to have his eyes fixed - cx appt and he will call back to let us know when he can come in. message to provider/desk nurse.

## 2013-10-27 ENCOUNTER — Other Ambulatory Visit: Payer: Self-pay

## 2013-10-28 ENCOUNTER — Other Ambulatory Visit: Payer: Self-pay

## 2013-10-28 DIAGNOSIS — I10 Essential (primary) hypertension: Secondary | ICD-10-CM

## 2013-10-28 MED ORDER — AMLODIPINE BESYLATE 5 MG PO TABS
5.0000 mg | ORAL_TABLET | Freq: Every day | ORAL | Status: DC
Start: 2013-10-28 — End: 2014-10-13

## 2013-10-29 ENCOUNTER — Ambulatory Visit: Payer: Medicare Other

## 2013-10-29 ENCOUNTER — Other Ambulatory Visit: Payer: Medicare Other

## 2014-01-23 ENCOUNTER — Emergency Department (HOSPITAL_COMMUNITY): Payer: Medicare Other

## 2014-01-23 ENCOUNTER — Emergency Department (HOSPITAL_COMMUNITY)
Admission: EM | Admit: 2014-01-23 | Discharge: 2014-01-23 | Disposition: A | Discharge status: Home / Self Care | Payer: Medicare Other | Attending: Emergency Medicine | Admitting: Emergency Medicine

## 2014-01-23 ENCOUNTER — Encounter (HOSPITAL_COMMUNITY): Payer: Self-pay | Admitting: Emergency Medicine

## 2014-01-23 DIAGNOSIS — G8929 Other chronic pain: Secondary | ICD-10-CM | POA: Insufficient documentation

## 2014-01-23 DIAGNOSIS — J449 Chronic obstructive pulmonary disease, unspecified: Secondary | ICD-10-CM | POA: Diagnosis not present

## 2014-01-23 DIAGNOSIS — I10 Essential (primary) hypertension: Secondary | ICD-10-CM | POA: Insufficient documentation

## 2014-01-23 DIAGNOSIS — Z8669 Personal history of other diseases of the nervous system and sense organs: Secondary | ICD-10-CM | POA: Diagnosis not present

## 2014-01-23 DIAGNOSIS — F17200 Nicotine dependence, unspecified, uncomplicated: Secondary | ICD-10-CM | POA: Diagnosis not present

## 2014-01-23 DIAGNOSIS — Z79899 Other long term (current) drug therapy: Secondary | ICD-10-CM | POA: Diagnosis not present

## 2014-01-23 DIAGNOSIS — R059 Cough, unspecified: Secondary | ICD-10-CM | POA: Insufficient documentation

## 2014-01-23 DIAGNOSIS — J4489 Other specified chronic obstructive pulmonary disease: Secondary | ICD-10-CM | POA: Insufficient documentation

## 2014-01-23 DIAGNOSIS — R05 Cough: Secondary | ICD-10-CM | POA: Insufficient documentation

## 2014-01-23 DIAGNOSIS — E785 Hyperlipidemia, unspecified: Secondary | ICD-10-CM | POA: Insufficient documentation

## 2014-01-23 DIAGNOSIS — Z8739 Personal history of other diseases of the musculoskeletal system and connective tissue: Secondary | ICD-10-CM | POA: Insufficient documentation

## 2014-01-23 DIAGNOSIS — Z87448 Personal history of other diseases of urinary system: Secondary | ICD-10-CM | POA: Diagnosis not present

## 2014-01-23 DIAGNOSIS — J42 Unspecified chronic bronchitis: Secondary | ICD-10-CM

## 2014-01-23 MED ORDER — AZITHROMYCIN 250 MG PO TABS: 500.0000 mg | ORAL_TABLET | Freq: Every day | ORAL | Status: DC

## 2014-01-23 MED ORDER — AZITHROMYCIN 250 MG PO TABS: ORAL_TABLET | ORAL | Status: DC

## 2014-01-23 MED ORDER — AZITHROMYCIN 250 MG PO TABS: 250.0000 mg | ORAL_TABLET | Freq: Every day | ORAL | Status: DC

## 2014-01-23 NOTE — Discharge Instructions (Signed)

## 2014-01-23 NOTE — ED Notes (Signed)
Pt ambulatory upon dc with cane. He verbalizes that he is leaving with ALL belongings he arrived with.

## 2014-01-23 NOTE — ED Provider Notes (Signed)
CSN: [id number]     Arrival date & time 01/23/14  1093 History   First MD Initiated Contact with Patient 01/23/14 0757     Chief Complaint  Patient presents with  . Cough  . Sore Throat      HPI Patient has a history of smoking.  Has had cough and sore throat for the past week.  Cough has been productive.  Patient is unsure this had fevers or not.  No recent weight loss.  No difficulty in breathing. Past Medical History  Diagnosis Date  . Chronic headaches   . Arthritis   . Lumbar disc disease   . Hypertension   . Meningitis 10/2010    related to molart abscess   . COPD (chronic obstructive pulmonary disease)   . Hyperlipidemia   . Chronic LBP   . BPH (benign prostatic hypertrophy)    Past Surgical History  Procedure Laterality Date  . Left rotator cuff repair  2002  . Hernia repair  1975  . Replacement total knee     History reviewed. No pertinent family history. History  Substance Use Topics  . Smoking status: Current Every Day Smoker -- 0.25 packs/day for 51 years    Types: Cigarettes  . Smokeless tobacco: Never Used     Comment: quit 2 weeks ago  . Alcohol Use: No    Review of Systems  All other systems reviewed and are negative  Allergies  Asa  Home Medications   Prior to Admission medications   Medication Sig Start Date End Date Taking? Authorizing Provider  amLODipine (NORVASC) 5 MG tablet Take 1 tablet (5 mg total) by mouth daily. 10/28/13   Janith Lima, MD  atorvastatin (LIPITOR) 10 MG tablet Take 1 tablet (10 mg total) by mouth daily. 04/09/13   Janith Lima, MD  azithromycin (ZITHROMAX Z-PAK) 250 MG tablet Take 2 tablets on the first day then one tablet a day until all gone 01/23/14   Dot Lanes, MD  HYDROcodone-acetaminophen (NORCO) 10-325 MG per tablet Take 1 tablet by mouth every 6 (six) hours as needed for moderate pain.    Historical Provider, MD  olmesartan-hydrochlorothiazide (BENICAR HCT) 40-12.5 MG per tablet Take 1 tablet by mouth  daily. 06/04/13   Janith Lima, MD   BP 148/72  Pulse 68  Temp(Src) 98.1 F (36.7 C) (Oral)  Resp 17  SpO2 98% Physical Exam  Nursing note and vitals reviewed. Constitutional: He is oriented to person, place, and time. He appears well-developed and well-nourished. No distress.  HENT:  Head: Normocephalic and atraumatic.  Mouth/Throat: Uvula is midline and oropharynx is clear and moist. No oropharyngeal exudate, posterior oropharyngeal edema, posterior oropharyngeal erythema or tonsillar abscesses.  Eyes: Pupils are equal, round, and reactive to light.  Neck: Normal range of motion. No tracheal deviation present. No thyromegaly present.  Cardiovascular: Normal rate and intact distal pulses.   Pulmonary/Chest: No respiratory distress.  Abdominal: Normal appearance. He exhibits no distension.  Musculoskeletal: Normal range of motion.  Lymphadenopathy:    He has no cervical adenopathy.  Neurological: He is alert and oriented to person, place, and time. No cranial nerve deficit.  Skin: Skin is warm and dry. No rash noted.  Psychiatric: He has a normal mood and affect. His behavior is normal.    ED Course  Procedures (including critical care time) Labs Review Labs Reviewed - No data to display  Imaging Review Dg Chest 2 View (if Patient Has Fever And/or Copd)  01/23/2014  CLINICAL DATA:  Cough.  Pharyngitis.  EXAM: CHEST  2 VIEW  COMPARISON:  12/16/2012.  FINDINGS: Poor inspiration. Normal sized heart. Clear lungs. Stable prominence of the interstitial markings. Thoracic spine degenerative changes.  IMPRESSION: Mild to moderate chronic interstitial lung disease. No acute abnormality.   Electronically Signed   By: Enrique Sack M.D.   On: 01/23/2014 08:05      MDM   Final diagnoses:  Chronic bronchitis, unspecified chronic bronchitis type        Dot Lanes, MD 01/23/14 [phone]

## 2014-01-23 NOTE — ED Notes (Addendum)
Pt reports cough and sore throat for past week. Cough productive with small amount of sputum. Unsure if he has had any fever at home. Pt speaking in complete sentences with no difficulty.

## 2014-02-24 LAB — HM DIABETES EYE EXAM

## 2014-03-18 ENCOUNTER — Ambulatory Visit (INDEPENDENT_AMBULATORY_CARE_PROVIDER_SITE_OTHER): Payer: Medicare Other | Admitting: Internal Medicine

## 2014-03-18 ENCOUNTER — Encounter: Payer: Self-pay | Admitting: Internal Medicine

## 2014-03-18 ENCOUNTER — Other Ambulatory Visit (INDEPENDENT_AMBULATORY_CARE_PROVIDER_SITE_OTHER): Payer: Medicare Other

## 2014-03-18 VITALS — BP 140/90 | HR 64 | Temp 97.9°F | Ht 69.0 in | Wt 221.0 lb

## 2014-03-18 DIAGNOSIS — N289 Disorder of kidney and ureter, unspecified: Secondary | ICD-10-CM

## 2014-03-18 DIAGNOSIS — I2510 Atherosclerotic heart disease of native coronary artery without angina pectoris: Secondary | ICD-10-CM

## 2014-03-18 DIAGNOSIS — Z23 Encounter for immunization: Secondary | ICD-10-CM

## 2014-03-18 DIAGNOSIS — N400 Enlarged prostate without lower urinary tract symptoms: Secondary | ICD-10-CM

## 2014-03-18 DIAGNOSIS — E785 Hyperlipidemia, unspecified: Secondary | ICD-10-CM

## 2014-03-18 DIAGNOSIS — E118 Type 2 diabetes mellitus with unspecified complications: Secondary | ICD-10-CM

## 2014-03-18 DIAGNOSIS — I10 Essential (primary) hypertension: Secondary | ICD-10-CM

## 2014-03-18 DIAGNOSIS — M1712 Unilateral primary osteoarthritis, left knee: Secondary | ICD-10-CM

## 2014-03-18 DIAGNOSIS — M160 Bilateral primary osteoarthritis of hip: Secondary | ICD-10-CM

## 2014-03-18 DIAGNOSIS — M519 Unspecified thoracic, thoracolumbar and lumbosacral intervertebral disc disorder: Secondary | ICD-10-CM

## 2014-03-18 DIAGNOSIS — Z0000 Encounter for general adult medical examination without abnormal findings: Secondary | ICD-10-CM

## 2014-03-18 LAB — URINALYSIS, ROUTINE W REFLEX MICROSCOPIC
Bilirubin Urine: NEGATIVE
Hgb urine dipstick: NEGATIVE
Ketones, ur: NEGATIVE
Leukocytes, UA: NEGATIVE
Nitrite: NEGATIVE
Specific Gravity, Urine: 1.025 (ref 1.000–1.030)
Total Protein, Urine: NEGATIVE
Urine Glucose: NEGATIVE
Urobilinogen, UA: 0.2 (ref 0.0–1.0)
pH: 6 (ref 5.0–8.0)

## 2014-03-18 LAB — COMPREHENSIVE METABOLIC PANEL
ALT: 13 U/L (ref 0–53)
AST: 18 U/L (ref 0–37)
Albumin: 3.7 g/dL (ref 3.5–5.2)
Alkaline Phosphatase: 115 U/L (ref 39–117)
BUN: 14 mg/dL (ref 6–23)
CO2: 25 mEq/L (ref 19–32)
Calcium: 9.4 mg/dL (ref 8.4–10.5)
Chloride: 106 mEq/L (ref 96–112)
Creatinine, Ser: 1 mg/dL (ref 0.4–1.5)
GFR: 94.23 mL/min (ref >60.00)
Glucose, Bld: 82 mg/dL (ref 70–99)
Potassium: 4.3 mEq/L (ref 3.5–5.1)
Sodium: 139 mEq/L (ref 135–145)
Total Bilirubin: 0.5 mg/dL (ref 0.2–1.2)
Total Protein: 7.8 g/dL (ref 6.0–8.3)

## 2014-03-18 LAB — CBC WITH DIFFERENTIAL/PLATELET
Basophils Absolute: 0 10*3/uL (ref 0.0–0.1)
Basophils Relative: 0.3 % (ref 0.0–3.0)
Eosinophils Absolute: 0.3 10*3/uL (ref 0.0–0.7)
Eosinophils Relative: 3.9 % (ref 0.0–5.0)
HCT: 46.3 % (ref 39.0–52.0)
Hemoglobin: 15.4 g/dL (ref 13.0–17.0)
Lymphocytes Relative: 33.6 % (ref 12.0–46.0)
Lymphs Abs: 2.8 10*3/uL (ref 0.7–4.0)
MCHC: 33.2 g/dL (ref 30.0–36.0)
MCV: 90.3 fl (ref 78.0–100.0)
Monocytes Absolute: 0.8 10*3/uL (ref 0.1–1.0)
Monocytes Relative: 9.2 % (ref 3.0–12.0)
Neutro Abs: 4.5 10*3/uL (ref 1.4–7.7)
Neutrophils Relative %: 53 % (ref 43.0–77.0)
Platelets: 323 10*3/uL (ref 150.0–400.0)
RBC: 5.13 Mil/uL (ref 4.22–5.81)
RDW: 15.3 % (ref 11.5–15.5)
WBC: 8.4 10*3/uL (ref 4.0–10.5)

## 2014-03-18 LAB — LIPID PANEL
Cholesterol: 154 mg/dL (ref 0–200)
HDL: 26.7 mg/dL — ABNORMAL LOW (ref >39.00)
LDL Cholesterol: 106 mg/dL — ABNORMAL HIGH (ref 0–99)
NonHDL: 127.3
Total CHOL/HDL Ratio: 6
Triglycerides: 107 mg/dL (ref 0.0–149.0)
VLDL: 21.4 mg/dL (ref 0.0–40.0)

## 2014-03-18 LAB — PSA: PSA: 0.87 ng/mL (ref 0.10–4.00)

## 2014-03-18 LAB — TSH: TSH: 1.32 u[IU]/mL (ref 0.35–4.50)

## 2014-03-18 LAB — HEMOGLOBIN A1C: Hgb A1c MFr Bld: 6.2 % (ref 4.6–6.5)

## 2014-03-18 MED ORDER — HYDROCODONE-ACETAMINOPHEN 10-325 MG PO TABS: 1.0000 | ORAL_TABLET | Freq: Four times a day (QID) | ORAL | Status: DC | PRN

## 2014-03-18 NOTE — Progress Notes (Signed)
Pre visit review using our clinic review tool, if applicable. No additional management support is needed unless otherwise documented below in the visit note. 

## 2014-03-18 NOTE — Progress Notes (Signed)
Subjective:    Patient ID: Adrian Miller, male    DOB: [birth year], 73 y.o.   MRN: [id number]  Arthritis Presents for follow-up visit. The disease course has been fluctuating. He complains of pain. He reports no stiffness, joint swelling or joint warmth. Affected locations include the left knee and right knee. His pain is at a severity of 6/10. Associated symptoms include pain at night and pain while resting. Pertinent negatives include no diarrhea, dry eyes, dry mouth, dysuria, fatigue, fever, rash, Raynaud's syndrome, uveitis or weight loss. His past medical history is significant for osteoarthritis. His pertinent risk factors include overuse. Past treatments include an opioid and acetaminophen. The treatment provided moderate relief. Factors aggravating his arthritis include activity.      Review of Systems  Constitutional: Negative.  Negative for fever, chills, weight loss, diaphoresis, appetite change and fatigue.  HENT: Negative.   Eyes: Negative.   Respiratory: Negative.  Negative for cough, choking, chest tightness, shortness of breath and stridor.   Cardiovascular: Negative.  Negative for chest pain, palpitations and leg swelling.  Gastrointestinal: Negative.  Negative for abdominal pain and diarrhea.  Endocrine: Negative.   Genitourinary: Negative.  Negative for dysuria.  Musculoskeletal: Positive for back pain, arthralgias and arthritis. Negative for myalgias, joint swelling, gait problem, stiffness, neck pain and neck stiffness.  Skin: Negative.  Negative for rash.  Allergic/Immunologic: Negative.   Neurological: Negative.   Hematological: Negative.  Negative for adenopathy. Does not bruise/bleed easily.  Psychiatric/Behavioral: Negative.        Objective:   Physical Exam  Constitutional: He is oriented to person, place, and time. He appears well-developed and well-nourished. No distress.  HENT:  Head: Normocephalic and atraumatic.  Mouth/Throat: Oropharynx is clear and  moist. No oropharyngeal exudate.  Eyes: Conjunctivae are normal. Right eye exhibits no discharge. Left eye exhibits no discharge. No scleral icterus.  Neck: Normal range of motion. Neck supple. No JVD present. No tracheal deviation present. No thyromegaly present.  Cardiovascular: Normal rate, regular rhythm, normal heart sounds and intact distal pulses.  Exam reveals no gallop and no friction rub.   No murmur heard. Pulmonary/Chest: Effort normal and breath sounds normal. No stridor. No respiratory distress. He has no wheezes. He has no rales. He exhibits no tenderness.  Abdominal: Soft. Bowel sounds are normal. He exhibits no distension and no mass. There is no tenderness. There is no rebound and no guarding.  Musculoskeletal: Normal range of motion. He exhibits no edema or tenderness.  Lymphadenopathy:    He has no cervical adenopathy.  Neurological: He is oriented to person, place, and time.  Skin: Skin is warm and dry. No rash noted. He is not diaphoretic. No erythema. No pallor.  Psychiatric: He has a normal mood and affect. His behavior is normal. Judgment and thought content normal.  Vitals reviewed.     Lab Results  Component Value Date   WBC 8.5 03/25/2013   HGB 17.7* 03/25/2013   HCT 52.0 03/25/2013   PLT 329 03/25/2013   GLUCOSE 151* 04/09/2013   CHOL 156 04/09/2013   TRIG 187.0* 04/09/2013   HDL 39.50 04/09/2013   LDLCALC 79 04/09/2013   ALT 24 03/25/2013   AST 29 03/25/2013   NA 138 04/09/2013   K 3.7 04/09/2013   CL 103 04/09/2013   CREATININE 1.0 04/09/2013   BUN 11 04/09/2013   CO2 29 04/09/2013   TSH 1.24 03/06/2013   INR 1.01 03/25/2013   HGBA1C 6.7* 04/09/2013  Assessment & Plan:

## 2014-03-18 NOTE — Patient Instructions (Signed)

## 2014-03-19 ENCOUNTER — Encounter: Payer: Self-pay | Admitting: Internal Medicine

## 2014-03-19 MED ORDER — EZETIMIBE 10 MG PO TABS: 10.0000 mg | ORAL_TABLET | Freq: Every day | ORAL | Status: DC

## 2014-03-20 NOTE — Assessment & Plan Note (Signed)
Will cont norco as needed for pain 

## 2014-03-20 NOTE — Assessment & Plan Note (Signed)
His blood sugars are adequately well controlled 

## 2014-03-20 NOTE — Assessment & Plan Note (Signed)
His BP is well controlled Lytes and renal function are stable 

## 2014-03-20 NOTE — Assessment & Plan Note (Signed)
He has not met his LDL goal so I have asked him to add zetia to the statin

## 2014-03-31 ENCOUNTER — Encounter: Payer: Self-pay | Admitting: Internal Medicine

## 2014-04-14 ENCOUNTER — Encounter: Payer: Self-pay | Admitting: Family Medicine

## 2014-04-14 ENCOUNTER — Ambulatory Visit (INDEPENDENT_AMBULATORY_CARE_PROVIDER_SITE_OTHER): Payer: Medicare Other | Admitting: Family Medicine

## 2014-04-14 VITALS — BP 142/86 | HR 68 | Ht 69.0 in | Wt 228.0 lb

## 2014-04-14 DIAGNOSIS — M19011 Primary osteoarthritis, right shoulder: Secondary | ICD-10-CM

## 2014-04-14 DIAGNOSIS — M1711 Unilateral primary osteoarthritis, right knee: Secondary | ICD-10-CM

## 2014-04-14 NOTE — Patient Instructions (Signed)
Great to meet you Ice 20 minutes 2 times daily. Usually after activity and before bed. Exercises 3 times a week. Alternate the knee and shoulder Try the brace with activity Take tylenol 650 mg three times a day is the best evidence based medicine we have for arthritis.  Glucosamine sulfate 750mg  twice a day is a supplement that has been shown to help moderate to severe arthritis. Vitamin D 2000 IU daily Fish oil 2 grams daily.  Tumeric 500mg  twice daily.  Capsaicin topically up to four times a day may also help with pain..  If cortisone injections do not help, there are different types of shots that may help but they take longer to take effect.  We can discuss this at follow up.  It's important that you continue to stay active. Shoe inserts with good arch support may be helpful.  [employer] orthotics at [employer] could help.  Water aerobics and cycling with low resistance are the best two types of exercise for arthritis. Come back and see me in 4-6 weeks.

## 2014-04-14 NOTE — Progress Notes (Signed)
[name] [address], [place] [address] Phone: [phone] Subjective:    I'm seeing this patient by the request  of:  Scarlette Calico, MD   CC: Right shoulder pain, right knee pain.  ONG:[id number] Adrian Miller is a 73 y.o. male coming in with complaint of right shoulder and knee pain.  Regarding patient's right shoulder. Patient states that it hurts with certain range of motion. Patient states that it has allowed clicking sound. Patient has been told that he does have a tear in his rotator cuff. Patient did not have her have any surgery on the right shoulder but has had repair on the left shoulder multiple years ago. Patient states that there is some mild weakness and sometimes radiation down the arm. Patient tries not to take any medications but from time to time needs hydrocodone. Denies any numbness in the fingertips. States that it does not wake him up at night. Rates the severity of 5 out of 10.  Is also complaining of right knee pain. Has had this pain for multiple years. Patient does have a left knee total replacement and has been told that he would need a replacement on the right knee. Patient would like to avoid this as long as possible. Patient has had steroid injections but not one for greater than a year. Patient denies any giving out on him but he does ambulate with the aid of a cane. Denies any radiation down the leg or any numbness or tingling associated with the knee but does have chronic low back pain that can contribute to this. Patient rates the severity of this as 6 out of 10.     Past medical history, social, surgical and family history all reviewed in electronic medical record.   Review of Systems: No headache, visual changes, nausea, vomiting, diarrhea, constipation, dizziness, abdominal pain, skin rash, fevers, chills, night sweats, weight loss, swollen lymph nodes, body aches, joint swelling, muscle aches, chest pain, shortness  of breath, mood changes.   Objective Blood pressure 142/86, pulse 68, height 5\' 9"  (1.753 m), weight 228 lb (103.42 kg), SpO2 95 %.  General: No apparent distress alert and oriented x3 mood and affect normal, dressed appropriately.  HEENT: Pupils equal, extraocular movements intact  Respiratory: Patient's speak in full sentences and does not appear short of breath  Cardiovascular: No lower extremity edema, non tender, no erythema  Skin: Warm dry intact with no signs of infection or rash on extremities or on axial skeleton.  Abdomen: Soft nontender  Neuro: Cranial nerves II through XII are intact, neurovascularly intact in all extremities with 2+ DTRs and 2+ pulses.  Lymph: No lymphadenopathy of posterior or anterior cervical chain or axillae bilaterally.  Gait he relates with the aid of a cane MSK:  Non tender with full range of motion and good stability and symmetric strength and tone of  elbows, wrist, hip, and ankles bilaterally. Degenerative changes of multiple joints Knee:right Normal to inspection with no erythema or effusion or obvious bony abnormalities. Tender to palpation of the medial and lateral joint lines ROM full in flexion and extension and lower leg rotation. Ligaments with solid consistent endpoints including ACL, PCL, LCL, MCL. Negative Mcmurray's, Apley's, and Thessalonian tests.  painful patellar compression. Patellar glide with moderate crepitus. Patellar and quadriceps tendons unremarkable. Hamstring and quadriceps strength is normal.  Contralateral knee has been replaced  Shoulder: Right Inspection reveals no abnormalities, atrophy or asymmetry. Palpation is normal with no  tenderness over AC joint or bicipital groove. ROM is full in all planes passively. Rotator cuff strength normal throughout. signs of impingement with positive Neer and Hawkin's tests, but negative empty can sign. Speeds and Yergason's tests normal. No labral pathology noted with negative  Obrien's, negative clunk and good stability. Normal scapular function observed. No painful arc and no drop arm sign. No apprehension sign  MSK US performed of: Right This study was ordered, performed, and interpreted by Charlann Boxer D.O.  Shoulder:   Supraspinatus:  Degenerative tear noted, Bursal bulge seen with shoulder abduction on impingement view. Infraspinatus:  Appears normal on long and transverse views. Significant increase in Doppler flow Subscapularis:  Degenerative tear noted Positive bursa Teres Minor:  Appears normal on long and transverse views. AC joint:  Moderate arthritis Glenohumeral Joint:  Severe arthritis Glenoid Labrum:  Difficult to assess Biceps Tendon:  Appears normal on long and transverse views, no fraying of tendon, tendon located in intertubercular groove, no subluxation with shoulder internal or external rotation.  Impression: Subacromial bursitis, severe osteophytic changes  Procedure: Real-time Ultrasound Guided Injection of right glenohumeral joint Device: GE Logiq E  Ultrasound guided injection is preferred based studies that show increased duration, increased effect, greater accuracy, decreased procedural pain, increased response rate with ultrasound guided versus blind injection.  Verbal informed consent obtained.  Time-out conducted.  Noted no overlying erythema, induration, or other signs of local infection.  Skin prepped in a sterile fashion.  Local anesthesia: Topical Ethyl chloride.  With sterile technique and under real time ultrasound guidance:  Joint visualized.  23g 1  inch needle inserted posterior approach. Pictures taken for needle placement. Patient did have injection of 2 cc of 1% lidocaine, 2 cc of 0.5% Marcaine, and 1.0 cc of Kenalog 40 mg/dL. Completed without difficulty  Pain immediately resolved suggesting accurate placement of the medication.  Advised to call if fevers/chills, erythema, induration, drainage, or persistent  bleeding.  Images permanently stored and available for review in the ultrasound unit.  Impression: Technically successful ultrasound guided injection.   After informed written and verbal consent, patient was seated on exam table. Right knee was prepped with alcohol swab and utilizing anterolateral approach, patient's right knee space was injected with 4:1  marcaine 0.5%: Kenalog 40mg /dL. Patient tolerated the procedure well without immediate complications.   Impression and Recommendations:     This case required medical decision making of moderate complexity.

## 2014-04-14 NOTE — Assessment & Plan Note (Signed)
Patient does have rotator cuff arthropathy. Patient did respond fairly well to the injection. Patient given home exercises to do 3 times a week. We discussed proper lifting mechanics. We discussed over-the-counter medications a candidate beneficial. We discussed icing regimen. Patient try these changes and come back and see me again in 3-4 weeks to make sure that he continues to improve. We can repeat the injection every 3-4 months if necessary.

## 2014-04-14 NOTE — Assessment & Plan Note (Signed)
Injected today. Home exercises given, patient was given a medial unloader brace. Patient sits that this is very come trouble. Patient and will come back and see me again in 3-4 weeks for further evaluation. Patient could be a candidate for viscous supplementation.

## 2014-05-17 ENCOUNTER — Telehealth: Payer: Self-pay | Admitting: Family Medicine

## 2014-05-17 DIAGNOSIS — M1712 Unilateral primary osteoarthritis, left knee: Secondary | ICD-10-CM

## 2014-05-17 DIAGNOSIS — M160 Bilateral primary osteoarthritis of hip: Secondary | ICD-10-CM

## 2014-05-17 DIAGNOSIS — M519 Unspecified thoracic, thoracolumbar and lumbosacral intervertebral disc disorder: Secondary | ICD-10-CM

## 2014-05-17 MED ORDER — HYDROCODONE-ACETAMINOPHEN 10-325 MG PO TABS: 1.0000 | ORAL_TABLET | Freq: Four times a day (QID) | ORAL | Status: DC | PRN

## 2014-05-17 NOTE — Telephone Encounter (Signed)
Pt.notified

## 2014-05-17 NOTE — Telephone Encounter (Signed)
Is requesting a script for pain meds.  Not sure what it is he is taking.

## 2014-05-17 NOTE — Telephone Encounter (Signed)
done

## 2014-05-17 NOTE — Telephone Encounter (Signed)
Pt is requesting a refill on Hydrocodone.

## 2014-05-20 ENCOUNTER — Ambulatory Visit: Payer: Medicare Other | Admitting: Family Medicine

## 2014-06-17 ENCOUNTER — Telehealth: Payer: Self-pay | Admitting: Family Medicine

## 2014-06-17 DIAGNOSIS — M1712 Unilateral primary osteoarthritis, left knee: Secondary | ICD-10-CM

## 2014-06-17 DIAGNOSIS — M160 Bilateral primary osteoarthritis of hip: Secondary | ICD-10-CM

## 2014-06-17 DIAGNOSIS — M519 Unspecified thoracic, thoracolumbar and lumbosacral intervertebral disc disorder: Secondary | ICD-10-CM

## 2014-06-17 MED ORDER — HYDROCODONE-ACETAMINOPHEN 10-325 MG PO TABS: 1.0000 | ORAL_TABLET | Freq: Four times a day (QID) | ORAL | Status: DC | PRN

## 2014-06-17 NOTE — Telephone Encounter (Signed)
done

## 2014-06-17 NOTE — Telephone Encounter (Signed)
Patient is requesting script for hydrocodone.   °

## 2014-07-14 ENCOUNTER — Telehealth: Payer: Self-pay | Admitting: Internal Medicine

## 2014-07-14 NOTE — Telephone Encounter (Signed)
appt set

## 2014-07-14 NOTE — Telephone Encounter (Signed)
Patient requesting HYDROcodone-acetaminophen (NORCO) 10-325 MG per tablet [[id number]] refill.

## 2014-07-14 NOTE — Telephone Encounter (Signed)
Last seen 03/2014, must be seen every 3 mos for controlled refills

## 2014-07-15 ENCOUNTER — Ambulatory Visit: Payer: Medicare Other | Admitting: Internal Medicine

## 2014-07-15 ENCOUNTER — Ambulatory Visit (INDEPENDENT_AMBULATORY_CARE_PROVIDER_SITE_OTHER): Payer: Medicare Other | Admitting: Internal Medicine

## 2014-07-15 ENCOUNTER — Encounter: Payer: Self-pay | Admitting: Internal Medicine

## 2014-07-15 VITALS — BP 140/80 | HR 77 | Temp 98.5°F | Resp 16 | Ht 69.0 in | Wt 222.0 lb

## 2014-07-15 DIAGNOSIS — M160 Bilateral primary osteoarthritis of hip: Secondary | ICD-10-CM

## 2014-07-15 DIAGNOSIS — M545 Low back pain, unspecified: Secondary | ICD-10-CM

## 2014-07-15 DIAGNOSIS — M1711 Unilateral primary osteoarthritis, right knee: Secondary | ICD-10-CM

## 2014-07-15 DIAGNOSIS — M1712 Unilateral primary osteoarthritis, left knee: Secondary | ICD-10-CM

## 2014-07-15 DIAGNOSIS — E118 Type 2 diabetes mellitus with unspecified complications: Secondary | ICD-10-CM

## 2014-07-15 DIAGNOSIS — M519 Unspecified thoracic, thoracolumbar and lumbosacral intervertebral disc disorder: Secondary | ICD-10-CM

## 2014-07-15 DIAGNOSIS — G8929 Other chronic pain: Secondary | ICD-10-CM

## 2014-07-15 DIAGNOSIS — M19011 Primary osteoarthritis, right shoulder: Secondary | ICD-10-CM

## 2014-07-15 DIAGNOSIS — I10 Essential (primary) hypertension: Secondary | ICD-10-CM

## 2014-07-15 MED ORDER — HYDROCODONE-ACETAMINOPHEN 10-325 MG PO TABS: 1.0000 | ORAL_TABLET | Freq: Four times a day (QID) | ORAL | Status: DC | PRN

## 2014-07-15 NOTE — Progress Notes (Signed)
Subjective:    Patient ID: Adrian Miller, male    DOB: 09-22-1940, 74 y.o.   MRN: [id number]  Hypertension This is a chronic problem. The current episode started more than 1 year ago. The problem has been gradually improving since onset. The problem is controlled. Pertinent negatives include no anxiety, blurred vision, chest pain, headaches, malaise/fatigue, neck pain, orthopnea, palpitations, peripheral edema, PND, shortness of breath or sweats. There are no associated agents to hypertension. Past treatments include calcium channel blockers, angiotensin blockers and diuretics. The current treatment provides moderate improvement. There are no compliance problems.  Hypertensive end-organ damage includes CAD/MI.      Review of Systems  Constitutional: Negative.  Negative for fever, chills, malaise/fatigue, diaphoresis, appetite change and fatigue.  HENT: Negative.   Eyes: Negative.  Negative for blurred vision.  Respiratory: Negative.  Negative for cough, chest tightness, shortness of breath and stridor.   Cardiovascular: Negative.  Negative for chest pain, palpitations, orthopnea, leg swelling and PND.  Gastrointestinal: Negative.  Negative for nausea, vomiting, abdominal pain, diarrhea, constipation and blood in stool.  Endocrine: Negative.   Genitourinary: Negative.  Negative for dysuria, urgency, frequency, hematuria, flank pain, decreased urine volume and difficulty urinating.  Musculoskeletal: Positive for back pain and arthralgias. Negative for myalgias, joint swelling, gait problem, neck pain and neck stiffness.  Skin: Negative.  Negative for rash.  Allergic/Immunologic: Negative.   Neurological: Negative.  Negative for headaches.  Hematological: Negative.  Negative for adenopathy. Does not bruise/bleed easily.  Psychiatric/Behavioral: Negative.        Objective:   Physical Exam  Constitutional: He is oriented to person, place, and time. He appears well-developed and  well-nourished. No distress.  HENT:  Head: Normocephalic and atraumatic.  Mouth/Throat: Oropharynx is clear and moist. No oropharyngeal exudate.  Eyes: Conjunctivae are normal. Right eye exhibits no discharge. Left eye exhibits no discharge. No scleral icterus.  Neck: Normal range of motion. Neck supple. No JVD present. No tracheal deviation present. No thyromegaly present.  Cardiovascular: Normal rate, regular rhythm, normal heart sounds and intact distal pulses.  Exam reveals no gallop and no friction rub.   No murmur heard. Pulmonary/Chest: Effort normal and breath sounds normal. No stridor. No respiratory distress. He has no wheezes. He has no rales. He exhibits no tenderness.  Abdominal: Soft. Bowel sounds are normal. He exhibits no distension and no mass. There is no tenderness. There is no rebound and no guarding.  Musculoskeletal: Normal range of motion. He exhibits no edema or tenderness.  Lymphadenopathy:    He has no cervical adenopathy.  Neurological: He is oriented to person, place, and time.  Skin: Skin is warm and dry. No rash noted. He is not diaphoretic. No erythema. No pallor.  Psychiatric: He has a normal mood and affect. His behavior is normal. Judgment and thought content normal.  Vitals reviewed.     Lab Results  Component Value Date   WBC 8.4 03/18/2014   HGB 15.4 03/18/2014   HCT 46.3 03/18/2014   PLT 323.0 03/18/2014   GLUCOSE 82 03/18/2014   CHOL 154 03/18/2014   TRIG 107.0 03/18/2014   HDL 26.70* 03/18/2014   LDLCALC 106* 03/18/2014   ALT 13 03/18/2014   AST 18 03/18/2014   NA 139 03/18/2014   K 4.3 03/18/2014   CL 106 03/18/2014   CREATININE 1.0 03/18/2014   BUN 14 03/18/2014   CO2 25 03/18/2014   TSH 1.32 03/18/2014   PSA 0.87 03/18/2014   INR 1.01 03/25/2013  HGBA1C 6.2 03/18/2014      Assessment & Plan:

## 2014-07-15 NOTE — Patient Instructions (Signed)

## 2014-07-15 NOTE — Progress Notes (Signed)
Pre visit review using our clinic review tool, if applicable. No additional management support is needed unless otherwise documented below in the visit note. 

## 2014-07-16 NOTE — Assessment & Plan Note (Signed)
His BP is well controlled Will monitor his lytes and renal function today

## 2014-07-16 NOTE — Assessment & Plan Note (Signed)
His blood sugars have been well controlled Will recheck his A1C and will monitor his renal function

## 2014-07-16 NOTE — Assessment & Plan Note (Signed)
Will cont norco as needed for pain 

## 2014-09-19 ENCOUNTER — Encounter (HOSPITAL_COMMUNITY): Payer: Self-pay | Admitting: *Deleted

## 2014-09-19 ENCOUNTER — Emergency Department (HOSPITAL_COMMUNITY)
Admission: EM | Admit: 2014-09-19 | Discharge: 2014-09-19 | Disposition: A | Discharge status: Home / Self Care | Payer: Medicare Other | Attending: Emergency Medicine | Admitting: Emergency Medicine

## 2014-09-19 DIAGNOSIS — Z9119 Patient's noncompliance with other medical treatment and regimen: Secondary | ICD-10-CM | POA: Diagnosis not present

## 2014-09-19 DIAGNOSIS — Z79899 Other long term (current) drug therapy: Secondary | ICD-10-CM | POA: Diagnosis not present

## 2014-09-19 DIAGNOSIS — E785 Hyperlipidemia, unspecified: Secondary | ICD-10-CM | POA: Diagnosis not present

## 2014-09-19 DIAGNOSIS — Z9114 Patient's other noncompliance with medication regimen: Secondary | ICD-10-CM

## 2014-09-19 DIAGNOSIS — Z720 Tobacco use: Secondary | ICD-10-CM | POA: Insufficient documentation

## 2014-09-19 DIAGNOSIS — R369 Urethral discharge, unspecified: Secondary | ICD-10-CM | POA: Diagnosis present

## 2014-09-19 DIAGNOSIS — J449 Chronic obstructive pulmonary disease, unspecified: Secondary | ICD-10-CM | POA: Insufficient documentation

## 2014-09-19 DIAGNOSIS — G8929 Other chronic pain: Secondary | ICD-10-CM | POA: Diagnosis not present

## 2014-09-19 DIAGNOSIS — M1990 Unspecified osteoarthritis, unspecified site: Secondary | ICD-10-CM | POA: Diagnosis not present

## 2014-09-19 DIAGNOSIS — I10 Essential (primary) hypertension: Secondary | ICD-10-CM | POA: Diagnosis not present

## 2014-09-19 DIAGNOSIS — A64 Unspecified sexually transmitted disease: Secondary | ICD-10-CM | POA: Insufficient documentation

## 2014-09-19 DIAGNOSIS — Z87438 Personal history of other diseases of male genital organs: Secondary | ICD-10-CM | POA: Insufficient documentation

## 2014-09-19 LAB — HIV ANTIBODY (ROUTINE TESTING W REFLEX): HIV Screen 4th Generation wRfx: NONREACTIVE

## 2014-09-19 LAB — RPR: RPR Ser Ql: NONREACTIVE

## 2014-09-19 MED ORDER — AZITHROMYCIN 250 MG PO TABS
1000.0000 mg | ORAL_TABLET | Freq: Once | ORAL | Status: AC
  Administered 2014-09-19: 1000 mg via ORAL
  Filled 2014-09-19: qty 4

## 2014-09-19 MED ORDER — AMLODIPINE BESYLATE 10 MG PO TABS
10.0000 mg | ORAL_TABLET | Freq: Once | ORAL | Status: AC
  Administered 2014-09-19: 10 mg via ORAL
  Filled 2014-09-19: qty 1

## 2014-09-19 MED ORDER — CEFTRIAXONE SODIUM 250 MG IJ SOLR
250.0000 mg | Freq: Once | INTRAMUSCULAR | Status: AC
  Administered 2014-09-19: 250 mg via INTRAMUSCULAR
  Filled 2014-09-19: qty 250

## 2014-09-19 MED ORDER — STERILE WATER FOR INJECTION IJ SOLN
INTRAMUSCULAR | Status: AC
  Filled 2014-09-19: qty 10

## 2014-09-19 NOTE — ED Notes (Signed)
"  his penis feels sticky, thinks he has a sexually transmitted disease,  Unprotected sex,"

## 2014-09-19 NOTE — Discharge Instructions (Signed)
Managing Your High Blood Pressure Blood pressure is a measurement of how forceful your blood is pressing against the walls of the arteries. Arteries are muscular tubes within the circulatory system. Blood pressure does not stay the same. Blood pressure rises when you are active, excited, or nervous; and it lowers during sleep and relaxation. If the numbers measuring your blood pressure stay above normal most of the time, you are at risk for health problems. High blood pressure (hypertension) is a long-term (chronic) condition in which blood pressure is elevated. A blood pressure reading is recorded as two numbers, such as 120 over 80 (or 120/80). The first, higher number is called the systolic pressure. It is a measure of the pressure in your arteries as the heart beats. The second, lower number is called the diastolic pressure. It is a measure of the pressure in your arteries as the heart relaxes between beats.  Keeping your blood pressure in a normal range is important to your overall health and prevention of health problems, such as heart disease and stroke. When your blood pressure is uncontrolled, your heart has to work harder than normal. High blood pressure is a very common condition in adults because blood pressure tends to rise with age. Men and women are equally likely to have hypertension but at different times in life. Before age 75, men are more likely to have hypertension. After 73 years of age, women are more likely to have it. Hypertension is especially common in African Americans. This condition often has no signs or symptoms. The cause of the condition is usually not known. Your caregiver can help you come up with a plan to keep your blood pressure in a normal, healthy range. BLOOD PRESSURE STAGES Blood pressure is classified into four stages: normal, prehypertension, stage 1, and stage 2. Your blood pressure reading will be used to determine what type of treatment, if any, is necessary.  Appropriate treatment options are tied to these four stages:  Normal  Systolic pressure (mm Hg): below 120.  Diastolic pressure (mm Hg): below 80. Prehypertension  Systolic pressure (mm Hg): 120 to 139.  Diastolic pressure (mm Hg): 80 to 89. Stage1  Systolic pressure (mm Hg): 140 to 159.  Diastolic pressure (mm Hg): 90 to 99. Stage2  Systolic pressure (mm Hg): 160 or above.  Diastolic pressure (mm Hg): 100 or above. RISKS RELATED TO HIGH BLOOD PRESSURE Managing your blood pressure is an important responsibility. Uncontrolled high blood pressure can lead to:  A heart attack.  A stroke.  A weakened blood vessel (aneurysm).  Heart failure.  Kidney damage.  Eye damage.  Metabolic syndrome.  Memory and concentration problems. HOW TO MANAGE YOUR BLOOD PRESSURE Blood pressure can be managed effectively with lifestyle changes and medicines (if needed). Your caregiver will help you come up with a plan to bring your blood pressure within a normal range. Your plan should include the following: Education  Read all information provided by your caregivers about how to control blood pressure.  Educate yourself on the latest guidelines and treatment recommendations. New research is always being done to further define the risks and treatments for high blood pressure. Lifestylechanges  Control your weight.  Avoid smoking.  Stay physically active.  Reduce the amount of salt in your diet.  Reduce stress.  Control any chronic conditions, such as high cholesterol or diabetes.  Reduce your alcohol intake. Medicines  Several medicines (antihypertensive medicines) are available, if needed, to bring blood pressure within a normal range.  Communication  Review all the medicines you take with your caregiver because there may be side effects or interactions.  Talk with your caregiver about your diet, exercise habits, and other lifestyle factors that may be contributing to  high blood pressure.  See your caregiver regularly. Your caregiver can help you create and adjust your plan for managing high blood pressure. RECOMMENDATIONS FOR TREATMENT AND FOLLOW-UP  The following recommendations are based on current guidelines for managing high blood pressure in nonpregnant adults. Use these recommendations to identify the proper follow-up period or treatment option based on your blood pressure reading. You can discuss these options with your caregiver.  Systolic pressure of 025 to 427 or diastolic pressure of 80 to 89: Follow up with your caregiver as directed.  Systolic pressure of 062 to 376 or diastolic pressure of 90 to 100: Follow up with your caregiver within 2 months.  Systolic pressure above 283 or diastolic pressure above 151: Follow up with your caregiver within 1 month.  Systolic pressure above 761 or diastolic pressure above 607: Consider antihypertensive therapy; follow up with your caregiver within 1 week.  Systolic pressure above 371 or diastolic pressure above 062: Begin antihypertensive therapy; follow up with your caregiver within 1 week. Document Released: 01/16/2012 Document Reviewed: 01/16/2012 [facility] Patient Information 2015 [place]. This information is not intended to replace advice given to you by your health care provider. Make sure you discuss any questions you have with your health care provider.  Sexually Transmitted Disease A sexually transmitted disease (STD) is a disease or infection that may be passed (transmitted) from person to person, usually during sexual activity. This may happen by way of saliva, semen, blood, vaginal mucus, or urine. Common STDs include:   Gonorrhea.   Chlamydia.   Syphilis.   HIV and AIDS.   Genital herpes.   Hepatitis B and C.   Trichomonas.   Human papillomavirus (HPV).   Pubic lice.   Scabies.  Mites.  Bacterial vaginosis. WHAT ARE CAUSES OF STDs? An STD may be caused by  bacteria, a virus, or parasites. STDs are often transmitted during sexual activity if one person is infected. However, they may also be transmitted through nonsexual means. STDs may be transmitted after:   Sexual intercourse with an infected person.   Sharing sex toys with an infected person.   Sharing needles with an infected person or using unclean piercing or tattoo needles.  Having intimate contact with the genitals, mouth, or rectal areas of an infected person.   Exposure to infected fluids during birth. WHAT ARE THE SIGNS AND SYMPTOMS OF STDs? Different STDs have different symptoms. Some people may not have any symptoms. If symptoms are present, they may include:   Painful or bloody urination.   Pain in the pelvis, abdomen, vagina, anus, throat, or eyes.   A skin rash, itching, or irritation.  Growths, ulcerations, blisters, or sores in the genital and anal areas.  Abnormal vaginal discharge with or without bad odor.   Penile discharge in men.   Fever.   Pain or bleeding during sexual intercourse.   Swollen glands in the groin area.   Yellow skin and eyes (jaundice). This is seen with hepatitis.   Swollen testicles.  Infertility.  Sores and blisters in the mouth. HOW ARE STDs DIAGNOSED? To make a diagnosis, your health care provider may:   Take a medical history.   Perform a physical exam.   Take a sample of any discharge to examine.  Swab  the throat, cervix, opening to the penis, rectum, or vagina for testing.  Test a sample of your first morning urine.   Perform blood tests.   Perform a Pap test, if this applies.   Perform a colposcopy.   Perform a laparoscopy.  HOW ARE STDs TREATED? Treatment depends on the STD. Some STDs may be treated but not cured.   Chlamydia, gonorrhea, trichomonas, and syphilis can be cured with antibiotic medicine.   Genital herpes, hepatitis, and HIV can be treated, but not cured, with prescribed  medicines. The medicines lessen symptoms.   Genital warts from HPV can be treated with medicine or by freezing, burning (electrocautery), or surgery. Warts may come back.   HPV cannot be cured with medicine or surgery. However, abnormal areas may be removed from the cervix, vagina, or vulva.   If your diagnosis is confirmed, your recent sexual partners need treatment. This is true even if they are symptom-free or have a negative culture or evaluation. They should not have sex until their health care providers say it is okay. HOW CAN I REDUCE MY RISK OF GETTING AN STD? Take these steps to reduce your risk of getting an STD:  Use latex condoms, dental dams, and water-soluble lubricants during sexual activity. Do not use petroleum jelly or oils.  Avoid having multiple sex partners.  Do not have sex with someone who has other sex partners.  Do not have sex with anyone you do not know or who is at high risk for an STD.  Avoid risky sex practices that can break your skin.  Do not have sex if you have open sores on your mouth or skin.  Avoid drinking too much alcohol or taking illegal drugs. Alcohol and drugs can affect your judgment and put you in a vulnerable position.  Avoid engaging in oral and anal sex acts.  Get vaccinated for HPV and hepatitis. If you have not received these vaccines in the past, talk to your health care provider about whether one or both might be right for you.   If you are at risk of being infected with HIV, it is recommended that you take a prescription medicine daily to prevent HIV infection. This is called pre-exposure prophylaxis (PrEP). You are considered at risk if:  You are a man who has sex with other men (MSM).  You are a heterosexual man or woman and are sexually active with more than one partner.  You take drugs by injection.  You are sexually active with a partner who has HIV.  Talk with your health care provider about whether you are at high  risk of being infected with HIV. If you choose to begin PrEP, you should first be tested for HIV. You should then be tested every 3 months for as long as you are taking PrEP.  WHAT SHOULD I DO IF I THINK I HAVE AN STD?  See your health care provider.   Tell your sexual partner(s). They should be tested and treated for any STDs.  Do not have sex until your health care provider says it is okay. WHEN SHOULD I GET IMMEDIATE MEDICAL CARE? Contact your health care provider right away if:   You have severe abdominal pain.  You are a man and notice swelling or pain in your testicles.  You are a woman and notice swelling or pain in your vagina. Document Released: 07/14/2002 Document Revised: 04/28/2013 Document Reviewed: 11/11/2012 [facility] Patient Information 2015 [place], [place]. This information is not intended  to replace advice given to you by your health care provider. Make sure you discuss any questions you have with your health care provider. ° °

## 2014-09-19 NOTE — ED Provider Notes (Signed)
CSN: [id number]     Arrival date & time 09/19/14  0421 History   First MD Initiated Contact with Patient 09/19/14 [phone]     Chief Complaint  Patient presents with  . SEXUALLY TRANSMITTED DISEASE     (Consider location/radiation/quality/duration/timing/severity/associated sxs/prior Treatment) HPI 74 year old male presents to the emergency department with complaint of stickiness to his penis.  He reports that he recently has had unprotected sex.  He thinks that he has a sexually transmitted disease.  He denies any dysuria.  Patient is uncircumcised.  Patient noted be hypertensive.  He reports that he only takes his blood pressure medicine, Norvasc, when he thinks that he might need it.  He took it yesterday morning.  He reports he had not taken it for several days prior to that.  He denies chest pain, shortness of breath, headache, dizziness, weakness or numbness. Past Medical History  Diagnosis Date  . Chronic headaches   . Arthritis   . Lumbar disc disease   . Hypertension   . Meningitis 10/2010    related to molart abscess   . COPD (chronic obstructive pulmonary disease)   . Hyperlipidemia   . Chronic LBP   . BPH (benign prostatic hypertrophy)    Past Surgical History  Procedure Laterality Date  . Left rotator cuff repair  2002  . Hernia repair  1975  . Replacement total knee     History reviewed. No pertinent family history. History  Substance Use Topics  . Smoking status: Current Every Day Smoker -- 0.25 packs/day for 51 years    Types: Cigarettes  . Smokeless tobacco: Never Used     Comment: quit 2 weeks ago  . Alcohol Use: No    Review of Systems  See History of Present Illness; otherwise all other systems are reviewed and negative   Allergies  Asa  Home Medications   Prior to Admission medications   Medication Sig Start Date End Date Taking? Authorizing Provider  amLODipine (NORVASC) 5 MG tablet Take 1 tablet (5 mg total) by mouth daily. 10/28/13  Yes Janith Lima, MD  HYDROcodone-acetaminophen (NORCO) 10-325 MG per tablet Take 1 tablet by mouth every 6 (six) hours as needed for moderate pain. 07/15/14  Yes Janith Lima, MD  atorvastatin (LIPITOR) 10 MG tablet Take 1 tablet (10 mg total) by mouth daily. Patient not taking: Reported on 09/19/2014 04/09/13   Janith Lima, MD  ezetimibe (ZETIA) 10 MG tablet Take 1 tablet (10 mg total) by mouth daily. Patient not taking: Reported on 09/19/2014 03/19/14   Janith Lima, MD  olmesartan-hydrochlorothiazide (BENICAR HCT) 40-12.5 MG per tablet Take 1 tablet by mouth daily. Patient not taking: Reported on 09/19/2014 06/04/13   Janith Lima, MD   BP 204/94 mmHg  Pulse 74  Temp(Src) 98.7 F (37.1 C) (Oral)  Resp 18  SpO2 96% Physical Exam  Constitutional: He is oriented to person, place, and time. He appears well-developed and well-nourished.  HENT:  Head: Normocephalic and atraumatic.  Nose: Nose normal.  Mouth/Throat: Oropharynx is clear and moist.  Eyes: Conjunctivae and EOM are normal. Pupils are equal, round, and reactive to light.  Neck: Normal range of motion. Neck supple. No JVD present. No tracheal deviation present. No thyromegaly present.  Cardiovascular: Normal rate, regular rhythm, normal heart sounds and intact distal pulses.  Exam reveals no gallop and no friction rub.   No murmur heard. Pulmonary/Chest: Effort normal and breath sounds normal. No stridor. No respiratory distress. He  has no wheezes. He has no rales. He exhibits no tenderness.  Abdominal: Soft. Bowel sounds are normal. He exhibits no distension and no mass. There is no tenderness. There is no rebound and no guarding.  Genitourinary:  No discharge noted at urethra.  No signs of balanitis or other penile infections.  Musculoskeletal: Normal range of motion. He exhibits no edema or tenderness.  Lymphadenopathy:    He has no cervical adenopathy.  Neurological: He is alert and oriented to person, place, and time. He displays  normal reflexes. He exhibits normal muscle tone. Coordination normal.  Skin: Skin is warm and dry. No rash noted. No erythema. No pallor.  Psychiatric: He has a normal mood and affect. His behavior is normal. Judgment and thought content normal.  Nursing note and vitals reviewed.   ED Course  Procedures (including critical care time) Labs Review Labs Reviewed  RPR  HIV ANTIBODY (ROUTINE TESTING)  GC/CHLAMYDIA PROBE AMP ([place])    Imaging Review No results found.   EKG Interpretation None      MDM   Final diagnoses:  Penile discharge  Essential hypertension  Noncompliance with medication regimen  STD (male)   74 year old male with reported penile discharge and stickiness.  Patient was swelled for gonorrhea and chlamydia.  HIV and RPR drawn.  Patient to be treated with Rocephin and azithromycin.  Patient counseled to take his blood pressure medicine every day.  Follow-up with health department and primary care advised.  Linton Flemings, MD 09/19/14 [phone]

## 2014-09-20 LAB — GC/CHLAMYDIA PROBE AMP (~~LOC~~) NOT AT ARMC
Chlamydia: NEGATIVE
Neisseria Gonorrhea: NEGATIVE

## 2014-10-13 ENCOUNTER — Other Ambulatory Visit: Payer: Self-pay

## 2014-10-13 DIAGNOSIS — I10 Essential (primary) hypertension: Secondary | ICD-10-CM

## 2014-10-13 MED ORDER — AMLODIPINE BESYLATE 5 MG PO TABS: 5.0000 mg | ORAL_TABLET | Freq: Every day | ORAL | Status: DC

## 2014-11-01 ENCOUNTER — Other Ambulatory Visit (INDEPENDENT_AMBULATORY_CARE_PROVIDER_SITE_OTHER): Payer: Medicare Other

## 2014-11-01 ENCOUNTER — Ambulatory Visit (INDEPENDENT_AMBULATORY_CARE_PROVIDER_SITE_OTHER): Payer: Medicare Other | Admitting: Family Medicine

## 2014-11-01 ENCOUNTER — Encounter: Payer: Self-pay | Admitting: Family Medicine

## 2014-11-01 VITALS — BP 134/82 | HR 66 | Ht 69.0 in | Wt 220.0 lb

## 2014-11-01 DIAGNOSIS — M79642 Pain in left hand: Secondary | ICD-10-CM

## 2014-11-01 DIAGNOSIS — G5602 Carpal tunnel syndrome, left upper limb: Secondary | ICD-10-CM

## 2014-11-01 DIAGNOSIS — M1711 Unilateral primary osteoarthritis, right knee: Secondary | ICD-10-CM

## 2014-11-01 NOTE — Progress Notes (Signed)
[facility] [address], [place] [address] Phone: [phone] Subjective:    I'm seeing this patient by the request  of:  Scarlette Calico, MD   CC: Multiple joint pain mostly being left wrist and right knee today  HLK:[id number] DAXTIN LEIKER is a 74 y.o. male coming in with complaint of right shoulder and knee pain.  Patient is not seen for greater than 6 months. Patient does have known osteophytic changes of the right knee pain. Patient has had contralateral knee replacement previously. Patient states that overall he has started to do relatively well. Patient states though that this is starting to decrease his ambulation secondary to the pain. States his ulcer on the medial aspect of the knee. States that going up or downstairs seems to be worse.  She is also complaining of hand pain.  She states that this is mostly on the left hand. States that he has numbness of the thumb index and middle finger. States at night it seems to be worse. Seems to get better throughout the day little bit. Continues to have constant numbness though. States that the pain is more of a sharp burning sensation. Has full range of motion. Denies any significant weakness.  .    Past medical history, social, surgical and family history all reviewed in electronic medical record.   Review of Systems: No headache, visual changes, nausea, vomiting, diarrhea, constipation, dizziness, abdominal pain, skin rash, fevers, chills, night sweats, weight loss, swollen lymph nodes, body aches, joint swelling, muscle aches, chest pain, shortness of breath, mood changes.   Objective Blood pressure 134/82, pulse 66, height 5\' 9"  (1.753 m), weight 220 lb (99.791 kg), SpO2 97 %.  General: No apparent distress alert and oriented x3 mood and affect normal, dressed appropriately.  HEENT: Pupils equal, extraocular movements intact  Respiratory: Patient's speak in full sentences and does not appear  short of breath  Cardiovascular: No lower extremity edema, non tender, no erythema  Skin: Warm dry intact with no signs of infection or rash on extremities or on axial skeleton.  Abdomen: Soft nontender  Neuro: Cranial nerves II through XII are intact, neurovascularly intact in all extremities with 2+ DTRs and 2+ pulses.  Lymph: No lymphadenopathy of posterior or anterior cervical chain or axillae bilaterally.  Gait he relates with the aid of a cane MSK:  Non tender with full range of motion and good stability and symmetric strength and tone of  elbows, wrist, hip, and ankles bilaterally. Degenerative changes of multiple joints Knee:right Normal to inspection with no erythema or effusion or obvious bony abnormalities. Tender to palpation of the medial and lateral joint lines ROM full in flexion and extension and lower leg rotation. Ligaments with solid consistent endpoints including ACL, PCL, LCL, MCL. Negative Mcmurray's, Apley's, and Thessalonian tests.  painful patellar compression. Patellar glide with moderate crepitus. Patellar and quadriceps tendons unremarkable. Hamstring and quadriceps strength is normal.  Contralateral knee has been replaced  Wrist: Left Inspection normal with no visible erythema or swelling. ROM smooth and normal with good flexion and extension and ulnar/radial deviation that is symmetrical with opposite wrist. Moderate osteophytic changes noted with some crepitus with range of motion. Palpation is normal over metacarpals, navicular, lunate, and TFCC; tendons without tenderness/ swelling No snuffbox tenderness. No tenderness over Canal of [place]. Strength 5/5 in all directions without pain. Positive Tinel's. Negative Watson's test.    Procedure: Real-time Ultrasound Guided Injection of left carpal tunnel Device: GE  Logiq E  Ultrasound guided injection is preferred based studies that show increased duration, increased effect, greater accuracy, decreased  procedural pain, increased response rate with ultrasound guided versus blind injection.  Verbal informed consent obtained.  Time-out conducted.  Noted no overlying erythema, induration, or other signs of local infection.  Skin prepped in a sterile fashion.  Local anesthesia: Topical Ethyl chloride.  With sterile technique and under real time ultrasound guidance:  median nerve visualized.  23g 5/8 inch needle inserted distal to proximal approach into nerve sheath. Pictures taken nfor needle placement. Patient did have injection of 2 cc of 1% lidocaine, 1 cc of 0.5% Marcaine, and 1 cc of Kenalog 40 mg/dL. Completed without difficulty  Pain immediately resolved suggesting accurate placement of the medication.  Advised to call if fevers/chills, erythema, induration, drainage, or persistent bleeding.  Images permanently stored and available for review in the ultrasound unit.  Impression: Technically successful ultrasound guided injection.  Procedure note After informed written and verbal consent, patient was seated on exam table. Right knee was prepped with alcohol swab and utilizing anterolateral approach, patient's right knee space was injected with 4:1  marcaine 0.5%: Kenalog 40mg /dL. Patient tolerated the procedure well without immediate complications.    Impression and Recommendations:     This case required medical decision making of moderate complexity.

## 2014-11-01 NOTE — Patient Instructions (Addendum)
God to see you Contonue the medications we discussed previously.  Ice 20 minutes 2 times daily as needed.  Stay active! We did 2 injections today  Wear gloves at night See me again in 3-4 weeks if you need more.  If shoulder and hip still hurts we will take care of it now.  Take tylenol 650 mg three times a day is the best evidence based medicine we have for arthritis.  Do not take the pain medicine with this.  Glucosamine sulfate 750mg  twice a day is a supplement that has been shown to help moderate to severe arthritis. Vitamin D 2000 IU daily Fish oil 2 grams daily.  Tumeric 500mg  twice daily.

## 2014-11-01 NOTE — Assessment & Plan Note (Signed)
Patient was given an injection today. Neer complete resolution of the numbness almost immediately. This is very good news. We discussed icing regimen. Patient we discussed the possibility of a brace which patient declined. We can repeat this injection and written months if necessary. Patient did have a history of a fracture left wrist that likely will be contributing to some of the discomfort and pain.

## 2014-11-01 NOTE — Progress Notes (Signed)
Pre visit review using our clinic review tool, if applicable. No additional management support is needed unless otherwise documented below in the visit note. 

## 2014-11-01 NOTE — Assessment & Plan Note (Signed)
Patient overall does have osteophytic changes. Patient's last injection was greater than 6 months ago. Patient could be a candidate for viscous supplementation otherwise he can keep doing the injection every 3-4 weeks as needed. Patient will remain active. If any worsening symptoms or instability will consider further workup. His home exercises and will try to do more regularly. Patient may need a custom brace in the future.

## 2014-11-17 ENCOUNTER — Other Ambulatory Visit (INDEPENDENT_AMBULATORY_CARE_PROVIDER_SITE_OTHER): Payer: Medicare Other

## 2014-11-17 ENCOUNTER — Encounter: Payer: Self-pay | Admitting: Internal Medicine

## 2014-11-17 ENCOUNTER — Ambulatory Visit (INDEPENDENT_AMBULATORY_CARE_PROVIDER_SITE_OTHER): Payer: Medicare Other | Admitting: Internal Medicine

## 2014-11-17 VITALS — BP 138/78 | HR 76 | Temp 98.1°F | Resp 16 | Ht 69.0 in | Wt 221.0 lb

## 2014-11-17 DIAGNOSIS — I2510 Atherosclerotic heart disease of native coronary artery without angina pectoris: Secondary | ICD-10-CM | POA: Diagnosis not present

## 2014-11-17 DIAGNOSIS — E118 Type 2 diabetes mellitus with unspecified complications: Secondary | ICD-10-CM

## 2014-11-17 DIAGNOSIS — I10 Essential (primary) hypertension: Secondary | ICD-10-CM | POA: Diagnosis not present

## 2014-11-17 DIAGNOSIS — M1711 Unilateral primary osteoarthritis, right knee: Secondary | ICD-10-CM

## 2014-11-17 DIAGNOSIS — M519 Unspecified thoracic, thoracolumbar and lumbosacral intervertebral disc disorder: Secondary | ICD-10-CM

## 2014-11-17 DIAGNOSIS — M19011 Primary osteoarthritis, right shoulder: Secondary | ICD-10-CM

## 2014-11-17 DIAGNOSIS — M1712 Unilateral primary osteoarthritis, left knee: Secondary | ICD-10-CM

## 2014-11-17 DIAGNOSIS — M160 Bilateral primary osteoarthritis of hip: Secondary | ICD-10-CM

## 2014-11-17 LAB — BASIC METABOLIC PANEL
BUN: 16 mg/dL (ref 6–23)
CO2: 28 mEq/L (ref 19–32)
Calcium: 9.4 mg/dL (ref 8.4–10.5)
Chloride: 105 mEq/L (ref 96–112)
Creatinine, Ser: 1.11 mg/dL (ref 0.40–1.50)
GFR: 83.39 mL/min (ref >60.00)
Glucose, Bld: 83 mg/dL (ref 70–99)
Potassium: 3.8 mEq/L (ref 3.5–5.1)
Sodium: 140 mEq/L (ref 135–145)

## 2014-11-17 LAB — HEMOGLOBIN A1C: Hgb A1c MFr Bld: 6.3 % (ref 4.6–6.5)

## 2014-11-17 MED ORDER — HYDROCODONE-ACETAMINOPHEN 10-325 MG PO TABS: 1.0000 | ORAL_TABLET | Freq: Four times a day (QID) | ORAL | Status: DC | PRN

## 2014-11-17 NOTE — Patient Instructions (Signed)

## 2014-11-17 NOTE — Progress Notes (Signed)
Pre visit review using our clinic review tool, if applicable. No additional management support is needed unless otherwise documented below in the visit note. 

## 2014-11-17 NOTE — Progress Notes (Signed)
Subjective:  Patient ID: [facility], male    DOB: 1941-04-26  Age: 74 y.o. MRN: [id number]  CC: Hypertension and Diabetes   HPI [facility] Miller presents for for blood pressure and blood sugar check. He also needs a refill on his pain medication for chronic back and joint pain.  Outpatient Prescriptions Prior to Visit  Medication Sig Dispense Refill  . amLODipine (NORVASC) 5 MG tablet Take 1 tablet (5 mg total) by mouth daily. 90 tablet 3  . atorvastatin (LIPITOR) 10 MG tablet Take 1 tablet (10 mg total) by mouth daily. 90 tablet 3  . ezetimibe (ZETIA) 10 MG tablet Take 1 tablet (10 mg total) by mouth daily. 90 tablet 3  . olmesartan-hydrochlorothiazide (BENICAR HCT) 40-12.5 MG per tablet Take 1 tablet by mouth daily. 30 tablet 5  . HYDROcodone-acetaminophen (NORCO) 10-325 MG per tablet Take 1 tablet by mouth every 6 (six) hours as needed for moderate pain. 90 tablet 0   No facility-administered medications prior to visit.    ROS Review of Systems  Constitutional: Negative.  Negative for fever, chills, diaphoresis, appetite change and fatigue.  HENT: Negative.   Eyes: Negative.   Respiratory: Negative.  Negative for cough, choking, chest tightness, shortness of breath and stridor.   Cardiovascular: Negative.  Negative for chest pain, palpitations and leg swelling.  Gastrointestinal: Negative.  Negative for nausea, vomiting, abdominal pain, diarrhea, constipation and blood in stool.  Endocrine: Negative.  Negative for polydipsia, polyphagia and polyuria.  Genitourinary: Negative.  Negative for urgency, hematuria and difficulty urinating.  Musculoskeletal: Positive for back pain and arthralgias.  Skin: Negative.   Allergic/Immunologic: Negative.   Neurological: Negative.  Negative for dizziness, syncope, speech difficulty, weakness, light-headedness, numbness and headaches.  Hematological: Negative.  Negative for adenopathy. Does not bruise/bleed easily.  Psychiatric/Behavioral:  Negative.     Objective:  BP 138/78 mmHg  Pulse 61  Temp(Src) 98.2 F (36.8 C) (Oral)  Resp 11  Ht 5\' 9"  (1.753 m)  Wt 221 lb (100.245 kg)  BMI 32.62 kg/m2  SpO2 94%  BP Readings from Last 3 Encounters:  11/17/14 138/78  11/01/14 134/82  09/19/14 184/94    Wt Readings from Last 3 Encounters:  11/17/14 221 lb (100.245 kg)  11/01/14 220 lb (99.791 kg)  07/15/14 222 lb (100.699 kg)    Physical Exam  Constitutional: He is oriented to person, place, and time. No distress.  HENT:  Mouth/Throat: Oropharynx is clear and moist. No oropharyngeal exudate.  Eyes: Conjunctivae are normal. Right eye exhibits no discharge. Left eye exhibits no discharge. No scleral icterus.  Neck: Normal range of motion. Neck supple. No JVD present. No tracheal deviation present. No thyromegaly present.  Cardiovascular: Normal rate, regular rhythm, normal heart sounds and intact distal pulses.  Exam reveals no gallop and no friction rub.   No murmur heard. Pulmonary/Chest: Effort normal and breath sounds normal. No stridor. No respiratory distress. He has no wheezes. He has no rales. He exhibits no tenderness.  Abdominal: Soft. Bowel sounds are normal. He exhibits no distension and no mass. There is no tenderness. There is no rebound and no guarding.  Musculoskeletal: Normal range of motion. He exhibits no edema or tenderness.  Lymphadenopathy:    He has no cervical adenopathy.  Neurological: He is oriented to person, place, and time.  Skin: Skin is warm and dry. No rash noted. He is not diaphoretic. No erythema. No pallor.  Psychiatric: He has a normal mood and affect. His behavior is  normal. Judgment and thought content normal.  Vitals reviewed.   Lab Results  Component Value Date   WBC 8.4 03/18/2014   HGB 15.4 03/18/2014   HCT 46.3 03/18/2014   PLT 323.0 03/18/2014   GLUCOSE 83 11/17/2014   CHOL 154 03/18/2014   TRIG 107.0 03/18/2014   HDL 26.70* 03/18/2014   LDLCALC 106* 03/18/2014   ALT  13 03/18/2014   AST 18 03/18/2014   NA 140 11/17/2014   K 3.8 11/17/2014   CL 105 11/17/2014   CREATININE 1.11 11/17/2014   BUN 16 11/17/2014   CO2 28 11/17/2014   TSH 1.32 03/18/2014   PSA 0.87 03/18/2014   INR 1.01 03/25/2013   HGBA1C 6.3 11/17/2014    No results found.  Assessment & Plan:   [facility] Miller was seen today for hypertension and diabetes.  Diagnoses and all orders for this visit:  Type II diabetes mellitus with manifestations- his blood sugars are well controlled. He will continue his lifestyle modifications. Orders: -     Basic metabolic panel; Future -     Hemoglobin A1c; Future  Essential hypertension, benign- his blood pressure is well-controlled, lites and renal function are stable. Orders: -     Basic metabolic panel; Future  Coronary artery disease involving native coronary artery of native heart without angina pectoris- he is symptom free with respect to this will continue risk modification  Primary osteoarthritis of right knee Orders: -     Discontinue: HYDROcodone-acetaminophen (NORCO) 10-325 MG per tablet; Take 1 tablet by mouth every 6 (six) hours as needed for moderate pain. -     Discontinue: HYDROcodone-acetaminophen (NORCO) 10-325 MG per tablet; Take 1 tablet by mouth every 6 (six) hours as needed for moderate pain. -     HYDROcodone-acetaminophen (NORCO) 10-325 MG per tablet; Take 1 tablet by mouth every 6 (six) hours as needed for moderate pain.  Primary osteoarthritis of right shoulder Orders: -     Discontinue: HYDROcodone-acetaminophen (NORCO) 10-325 MG per tablet; Take 1 tablet by mouth every 6 (six) hours as needed for moderate pain. -     Discontinue: HYDROcodone-acetaminophen (NORCO) 10-325 MG per tablet; Take 1 tablet by mouth every 6 (six) hours as needed for moderate pain. -     HYDROcodone-acetaminophen (NORCO) 10-325 MG per tablet; Take 1 tablet by mouth every 6 (six) hours as needed for moderate pain.  Lumbar disc disease Orders: -      Discontinue: HYDROcodone-acetaminophen (NORCO) 10-325 MG per tablet; Take 1 tablet by mouth every 6 (six) hours as needed for moderate pain. -     Discontinue: HYDROcodone-acetaminophen (NORCO) 10-325 MG per tablet; Take 1 tablet by mouth every 6 (six) hours as needed for moderate pain. -     HYDROcodone-acetaminophen (NORCO) 10-325 MG per tablet; Take 1 tablet by mouth every 6 (six) hours as needed for moderate pain.  Primary osteoarthritis of left knee Orders: -     Discontinue: HYDROcodone-acetaminophen (NORCO) 10-325 MG per tablet; Take 1 tablet by mouth every 6 (six) hours as needed for moderate pain. -     Discontinue: HYDROcodone-acetaminophen (NORCO) 10-325 MG per tablet; Take 1 tablet by mouth every 6 (six) hours as needed for moderate pain. -     HYDROcodone-acetaminophen (NORCO) 10-325 MG per tablet; Take 1 tablet by mouth every 6 (six) hours as needed for moderate pain.  Primary osteoarthritis of both hips Orders: -     Discontinue: HYDROcodone-acetaminophen (NORCO) 10-325 MG per tablet; Take 1 tablet by mouth every 6 (  six) hours as needed for moderate pain. -     Discontinue: HYDROcodone-acetaminophen (NORCO) 10-325 MG per tablet; Take 1 tablet by mouth every 6 (six) hours as needed for moderate pain. -     HYDROcodone-acetaminophen (NORCO) 10-325 MG per tablet; Take 1 tablet by mouth every 6 (six) hours as needed for moderate pain.   I am having Adrian Miller maintain his atorvastatin, olmesartan-hydrochlorothiazide, ezetimibe, amLODipine, and HYDROcodone-acetaminophen.  Meds ordered this encounter  Medications  . DISCONTD: HYDROcodone-acetaminophen (NORCO) 10-325 MG per tablet    Sig: Take 1 tablet by mouth every 6 (six) hours as needed for moderate pain.    Dispense:  90 tablet    Refill:  0    Fill on or after 11/17/14  . DISCONTD: HYDROcodone-acetaminophen (NORCO) 10-325 MG per tablet    Sig: Take 1 tablet by mouth every 6 (six) hours as needed for moderate pain.     Dispense:  90 tablet    Refill:  0    Fill on or after 12/1314  . HYDROcodone-acetaminophen (NORCO) 10-325 MG per tablet    Sig: Take 1 tablet by mouth every 6 (six) hours as needed for moderate pain.    Dispense:  90 tablet    Refill:  0    Fill on or after 01/18/15     Follow-up: Return in about 4 months (around 03/20/2015).  Scarlette Calico, MD

## 2014-11-18 ENCOUNTER — Encounter: Payer: Self-pay | Admitting: Internal Medicine

## 2015-01-21 ENCOUNTER — Ambulatory Visit (INDEPENDENT_AMBULATORY_CARE_PROVIDER_SITE_OTHER)
Admission: RE | Admit: 2015-01-21 | Discharge: 2015-01-21 | Disposition: A | Discharge status: Home / Self Care | Payer: Medicare Other | Source: Ambulatory Visit | Attending: Family Medicine | Admitting: Family Medicine

## 2015-01-21 ENCOUNTER — Other Ambulatory Visit (INDEPENDENT_AMBULATORY_CARE_PROVIDER_SITE_OTHER): Payer: Medicare Other

## 2015-01-21 ENCOUNTER — Encounter: Payer: Self-pay | Admitting: Family Medicine

## 2015-01-21 ENCOUNTER — Ambulatory Visit (INDEPENDENT_AMBULATORY_CARE_PROVIDER_SITE_OTHER): Payer: Medicare Other | Admitting: Family Medicine

## 2015-01-21 VITALS — BP 144/86 | HR 66 | Ht 69.0 in | Wt 223.0 lb

## 2015-01-21 DIAGNOSIS — M1711 Unilateral primary osteoarthritis, right knee: Secondary | ICD-10-CM | POA: Diagnosis not present

## 2015-01-21 DIAGNOSIS — M25551 Pain in right hip: Secondary | ICD-10-CM

## 2015-01-21 DIAGNOSIS — M160 Bilateral primary osteoarthritis of hip: Secondary | ICD-10-CM

## 2015-01-21 NOTE — Patient Instructions (Signed)
Good to see you Tylenol 325 mg 3 times daily Ice when you need it up to 3 times a day Get xray downstairs today and I will call you if I think you need a replacement Otherwise we can repeat every 3-4 months.  See me again when you need me.

## 2015-01-21 NOTE — Assessment & Plan Note (Signed)
Patient does have arthritis of the hip and we will get x-rays to further evaluate. This is seen on ultrasound. Patient given some mild range of motion exercises and did respond very well to the injection. Discussed with continue this every 3-4 months if needed. Otherwise patient may need to consider surgical intervention. If patient has worsening pain within the next several weeks we would consider referral.

## 2015-01-21 NOTE — Progress Notes (Signed)
[name] [place], [place] [address] Phone: [phone] Subjective:     CC: Right hip and right knee pain  [id number] Adrian Miller is a 74 y.o. male coming in with complaint of right hip and right knee. Right hip pain. Patient's has had this pain for quite some time but worsening over the course last several weeks. States that it has groin pain that seems to radiate to the posterior aspect as well. Sometimes feels like it is unstable. States that sometimes has pain at night as well. Does walk with the aid of a cane. Rates the severity of pain a 7 out of 10. Patient has had arthritis of multiple different joints and has been told that he has arthritis in this joint as well.  Patient is also complaining of right knee pain. States that it feels unstable as well. Has history of severe arthritis of the contralateral knee. Patient states it feels very similar. Most the pain seems to be on the medial aspect of the knee. Sometimes feels unstable. Severity 8 out of 10. Sometimes has swelling. Ambulates with the aid of a cane.     Past medical history, social, surgical and family history all reviewed in electronic medical record.   Review of Systems: No headache, visual changes, nausea, vomiting, diarrhea, constipation, dizziness, abdominal pain, skin rash, fevers, chills, night sweats, weight loss, swollen lymph nodes, body aches, joint swelling, muscle aches, chest pain, shortness of breath, mood changes.   Objective Blood pressure 144/86, pulse 66, weight 223 lb (101.152 kg), SpO2 96 %.  General: No apparent distress alert and oriented x3 mood and affect normal, dressed appropriately.  HEENT: Pupils equal, extraocular movements intact  Respiratory: Patient's speak in full sentences and does not appear short of breath  Cardiovascular: No lower extremity edema, non tender, no erythema  Skin: Warm dry intact with no signs of infection or rash on  extremities or on axial skeleton.  Abdomen: Soft nontender  Neuro: Cranial nerves II through XII are intact, neurovascularly intact in all extremities with 2+ DTRs and 2+ pulses.  Lymph: No lymphadenopathy of posterior or anterior cervical chain or axillae bilaterally.  Gait normal with good balance and coordination.  MSK:  Non tender with full range of motion and good stability and symmetric strength and tone of shoulders, elbows, wrist, and ankles bilaterally.  Hip: Right ROM IR: 15 Deg with discomfort in the groin, ER: 45 Deg, Flexion: 100 Deg, Extension: 80 Deg, Abduction: 45 Deg, Adduction: 15 Deg Strength 4 out of 5 strength compared to 5 out of 5 on the contralateral side Pelvic alignment unremarkable to inspection and palpation. Standing hip rotation and gait without trendelenburg sign / unsteadiness. Greater trochanter without tenderness to palpation. No tenderness over piriformis and greater trochanter. Significant pain with internal rotation No SI joint tenderness and normal minimal SI movement.  Knee: Right Normal to inspection with no erythema or effusion or obvious bony abnormalities. Valgus deformity noted Tender to palpation over the medial joint line ROM full in flexion and extension and lower leg rotation. Mild instability with valgus stress Non painful patellar compression. Patellar glide without crepitus. Patellar and quadriceps tendons unremarkable. Hamstring and quadriceps strength is normal.  Contralateral knee unremarkable  Procedure: Real-time Ultrasound Guided Injection of right hip Device: GE Logiq E  Ultrasound guided injection is preferred based studies that show increased duration, increased effect, greater accuracy, decreased procedural pain, increased response rate with ultrasound guided versus blind  injection.  Verbal informed consent obtained.  Time-out conducted.  Noted no overlying erythema, induration, or other signs of local infection.  Skin  prepped in a sterile fashion.  Local anesthesia: Topical Ethyl chloride.  With sterile technique and under real time ultrasound guidance:  Anterior capsule visualized, needle visualized going to the head neck junction at the anterior capsule. Pictures taken. Patient did have injection of 3 cc of 1% lidocaine, 3 cc of 0.5% Marcaine, and 1 cc of Kenalog 40 mg/dL. Completed without difficulty  Pain immediately resolved suggesting accurate placement of the medication.  Advised to call if fevers/chills, erythema, induration, drainage, or persistent bleeding.  Images permanently stored and available for review in the ultrasound unit.  Impression: Technically successful ultrasound guided injection.   After informed written and verbal consent, patient was seated on exam table. Right knee was prepped with alcohol swab and utilizing anterolateral approach, patient's right knee space was injected with 4:1  marcaine 0.5%: Kenalog 40mg /dL. Patient tolerated the procedure well without immediate complications.    Impression and Recommendations:     This case required medical decision making of moderate complexity.

## 2015-01-21 NOTE — Assessment & Plan Note (Signed)
Injected today.  Discussed HEP and possible bracing.  Patient declined custom bracing at this time but we may went to consider this. We could consider also viscous supplementation in this individual. We discussed patient should come back in 4 weeks for further evaluation and treatment.

## 2015-01-21 NOTE — Progress Notes (Signed)
Pre visit review using our clinic review tool, if applicable. No additional management support is needed unless otherwise documented below in the visit note. 

## 2015-01-31 ENCOUNTER — Other Ambulatory Visit: Payer: Self-pay | Admitting: Orthopaedic Surgery

## 2015-02-11 ENCOUNTER — Encounter (HOSPITAL_COMMUNITY): Payer: Self-pay

## 2015-02-11 ENCOUNTER — Ambulatory Visit (HOSPITAL_COMMUNITY)
Admission: RE | Admit: 2015-02-11 | Discharge: 2015-02-11 | Disposition: A | Discharge status: Home / Self Care | Payer: Medicare Other | Source: Ambulatory Visit | Attending: Orthopaedic Surgery | Admitting: Orthopaedic Surgery

## 2015-02-11 ENCOUNTER — Encounter (HOSPITAL_COMMUNITY)
Admission: RE | Admit: 2015-02-11 | Discharge: 2015-02-11 | Disposition: A | Discharge status: Home / Self Care | Payer: Medicare Other | Source: Ambulatory Visit | Attending: Orthopaedic Surgery | Admitting: Orthopaedic Surgery

## 2015-02-11 DIAGNOSIS — Z0183 Encounter for blood typing: Secondary | ICD-10-CM | POA: Diagnosis not present

## 2015-02-11 DIAGNOSIS — Z01812 Encounter for preprocedural laboratory examination: Secondary | ICD-10-CM | POA: Diagnosis not present

## 2015-02-11 DIAGNOSIS — J449 Chronic obstructive pulmonary disease, unspecified: Secondary | ICD-10-CM | POA: Insufficient documentation

## 2015-02-11 DIAGNOSIS — Z79899 Other long term (current) drug therapy: Secondary | ICD-10-CM | POA: Diagnosis not present

## 2015-02-11 DIAGNOSIS — M179 Osteoarthritis of knee, unspecified: Secondary | ICD-10-CM | POA: Insufficient documentation

## 2015-02-11 DIAGNOSIS — I10 Essential (primary) hypertension: Secondary | ICD-10-CM | POA: Insufficient documentation

## 2015-02-11 DIAGNOSIS — F17210 Nicotine dependence, cigarettes, uncomplicated: Secondary | ICD-10-CM | POA: Diagnosis not present

## 2015-02-11 DIAGNOSIS — E785 Hyperlipidemia, unspecified: Secondary | ICD-10-CM | POA: Insufficient documentation

## 2015-02-11 DIAGNOSIS — Z01818 Encounter for other preprocedural examination: Secondary | ICD-10-CM | POA: Insufficient documentation

## 2015-02-11 LAB — BASIC METABOLIC PANEL
Anion gap: 11 (ref 5–15)
BUN: 14 mg/dL (ref 6–20)
CO2: 21 mmol/L — ABNORMAL LOW (ref 22–32)
Calcium: 9.2 mg/dL (ref 8.9–10.3)
Chloride: 106 mmol/L (ref 101–111)
Creatinine, Ser: 0.89 mg/dL (ref 0.61–1.24)
GFR calc Af Amer: >60 mL/min (ref >60)
GFR calc non Af Amer: >60 mL/min (ref >60)
Glucose, Bld: 116 mg/dL — ABNORMAL HIGH (ref 65–99)
Potassium: 4.8 mmol/L (ref 3.5–5.1)
Sodium: 138 mmol/L (ref 135–145)

## 2015-02-11 LAB — URINALYSIS, ROUTINE W REFLEX MICROSCOPIC
Bilirubin Urine: NEGATIVE
Glucose, UA: NEGATIVE mg/dL
Hgb urine dipstick: NEGATIVE
Ketones, ur: NEGATIVE mg/dL
Leukocytes, UA: NEGATIVE
Nitrite: NEGATIVE
Protein, ur: NEGATIVE mg/dL
Specific Gravity, Urine: >1.03 — ABNORMAL HIGH (ref 1.005–1.030)
Urobilinogen, UA: 0.2 mg/dL (ref 0.0–1.0)
pH: 5 (ref 5.0–8.0)

## 2015-02-11 LAB — ABO/RH: ABO/RH(D): O POS

## 2015-02-11 LAB — CBC WITH DIFFERENTIAL/PLATELET
Basophils Absolute: 0 10*3/uL (ref 0.0–0.1)
Basophils Relative: 0 %
Eosinophils Absolute: 0.4 10*3/uL (ref 0.0–0.7)
Eosinophils Relative: 4 %
HCT: 45.2 % (ref 39.0–52.0)
Hemoglobin: 15.3 g/dL (ref 13.0–17.0)
Lymphocytes Relative: 18 %
Lymphs Abs: 1.8 10*3/uL (ref 0.7–4.0)
MCH: 30.7 pg (ref 26.0–34.0)
MCHC: 33.8 g/dL (ref 30.0–36.0)
MCV: 90.8 fL (ref 78.0–100.0)
Monocytes Absolute: 0.7 10*3/uL (ref 0.1–1.0)
Monocytes Relative: 7 %
Neutro Abs: 7.3 10*3/uL (ref 1.7–7.7)
Neutrophils Relative %: 71 %
Platelets: 273 10*3/uL (ref 150–400)
RBC: 4.98 MIL/uL (ref 4.22–5.81)
RDW: 15.5 % (ref 11.5–15.5)
WBC: 10.2 10*3/uL (ref 4.0–10.5)

## 2015-02-11 LAB — SURGICAL PCR SCREEN
MRSA, PCR: POSITIVE — AB
Staphylococcus aureus: POSITIVE — AB

## 2015-02-11 LAB — PROTIME-INR
INR: 1.08 (ref 0.00–1.49)
Prothrombin Time: 14.2 seconds (ref 11.6–15.2)

## 2015-02-11 LAB — APTT: aPTT: 31 seconds (ref 24–37)

## 2015-02-11 LAB — TYPE AND SCREEN
ABO/RH(D): O POS
Antibody Screen: NEGATIVE

## 2015-02-11 MED ORDER — CHLORHEXIDINE GLUCONATE 4 % EX LIQD: 60.0000 mL | Freq: Once | CUTANEOUS | Status: DC

## 2015-02-11 NOTE — Progress Notes (Signed)
[facility] called into the  [facility] on [address] 

## 2015-02-11 NOTE — Progress Notes (Addendum)
Cardiologist denies having one  Medical Md is Dr.Thomas Jones  Echo in 1999 per pt  Stress test report in epic from [place] cath in 1999 per pt  EKG denies having in past yr  CXR denies having in past yr

## 2015-02-11 NOTE — Pre-Procedure Instructions (Signed)
Adrian Miller  02/11/2015      [id number] [place], [place] [address] [place] [address] Phone: [phone] Fax: [phone]    Your procedure is scheduled on Tues, Oct 18 @ 12:50 PM  Report to [facility] Admitting at 10:45 AM  Call this number if you have problems the morning of surgery:  [phone]   Remember:  Do not eat food or drink liquids after midnight.  Take these medicines the morning of surgery with A SIP OF WATER Amlodipine(Norvasc) and Pain Pill(if needed)             No Goody's,BC's,Aleve,Aspirin,Ibuprofen,Fish Oil,or any Herbal Medications.    Do not wear jewelry.  Do not wear lotions, powders, or colognes.  You may wear deodorant.             Men may shave face and neck.  Do not bring valuables to the hospital.  [facility] is not responsible for any belongings or valuables.  Contacts, dentures or bridgework may not be worn into surgery.  Leave your suitcase in the car.  After surgery it may be brought to your room.  For patients admitted to the hospital, discharge time will be determined by your treatment team.  Patients discharged the day of surgery will not be allowed to drive home.    Special instructions:  [place] - Preparing for Surgery  Before surgery, you can play an important role.  Because skin is not sterile, your skin needs to be as free of germs as possible.  You can reduce the number of germs on you skin by washing with CHG (chlorahexidine gluconate) soap before surgery.  CHG is an antiseptic cleaner which kills germs and bonds with the skin to continue killing germs even after washing.  Please DO NOT use if you have an allergy to CHG or antibacterial soaps.  If your skin becomes reddened/irritated stop using the CHG and inform your nurse when you arrive at Short Stay.  Do not shave (including legs and underarms) for at least 48 hours prior to the first CHG shower.  You may shave your  face.  Please follow these instructions carefully:   1.  Shower with CHG Soap the night before surgery and the                                morning of Surgery.  2.  If you choose to wash your hair, wash your hair first as usual with your       normal shampoo.  3.  After you shampoo, rinse your hair and body thoroughly to remove the                      Shampoo.  4.  Use CHG as you would any other liquid soap.  You can apply chg directly       to the skin and wash gently with scrungie or a clean washcloth.  5.  Apply the CHG Soap to your body ONLY FROM THE NECK DOWN.        Do not use on open wounds or open sores.  Avoid contact with your eyes,       ears, mouth and genitals (private parts).  Wash genitals (private parts)       with your normal soap.  6.  Wash thoroughly, paying special attention to  the area where your surgery        will be performed.  7.  Thoroughly rinse your body with warm water from the neck down.  8.  DO NOT shower/wash with your normal soap after using and rinsing off       the CHG Soap.  9.  Pat yourself dry with a clean towel.            10.  Wear clean pajamas.            11.  Place clean sheets on your bed the night of your first shower and do not        sleep with pets.  Day of Surgery  Do not apply any lotions/deoderants the morning of surgery.  Please wear clean clothes to the hospital/surgery center.    Please read over the following fact sheets that you were given. Pain Booklet, Coughing and Deep Breathing, Blood Transfusion Information, MRSA Information and Surgical Site Infection Prevention

## 2015-02-14 NOTE — Progress Notes (Signed)
Anesthesia Chart Review:  Pt is 74 year old male scheduled for R total knee arthroplasty on 02/22/2015 with Dr. Rhona Raider.   PMH includes: HTN, hyperlipidemia, COPD (pt denies this). Current smoker. BMI 33.   Medications include: amlodipine  Preoperative labs reviewed.    Chest x-ray 02/11/2015 reviewed. COPD with mild stable interstitial prominent. There is no CHF, pneumonia, nor other acute cardiopulmonary abnormality.  EKG 02/11/2015: NSR. Cannot rule out Inferior infarct, age undetermined. Non-specific ST-t changes. EKG appears stable dating back to 04/07/1998.   Echo 10/30/2010: -LV cavity size normal, systolic function normal. EF 55-60%. Increased relative contribution of atrial contraction to ventricular filling. Doppler parameters are consistent with abnormal LV relaxation (grade I diastolic dysfunction).  -Aortic valve: moderate diffuse thickening and calcification, consistent with sclerosis -LA mildly dilated  Cardiac cath 12/05/1999:  1. No evidence of flow-limiting coronary artery disease, with mild coronary plaquing of the LAD (mid 30%). 2. Elevated left ventricular end-diastolic pressure, consistent with diastolic dysfunction.  Reviewed case with Dr. Kalman Shan.   If no changes, I anticipate pt can proceed with surgery as scheduled.   Willeen Cass, FNP-BC [facility] Stay [facility]/Anesthesiology Phone: [phone] 02/14/2015 4:11 PM

## 2015-02-16 ENCOUNTER — Other Ambulatory Visit: Payer: Self-pay

## 2015-02-16 ENCOUNTER — Telehealth: Payer: Self-pay | Admitting: Internal Medicine

## 2015-02-16 ENCOUNTER — Other Ambulatory Visit: Payer: Self-pay | Admitting: Internal Medicine

## 2015-02-16 MED ORDER — HYDROCODONE-ACETAMINOPHEN 10-325 MG PO TABS: 1.0000 | ORAL_TABLET | Freq: Four times a day (QID) | ORAL | Status: DC | PRN

## 2015-02-16 NOTE — Telephone Encounter (Signed)
Patient requesting refill for HYDROcodone-acetaminophen (NORCO) 7.5-325 MG tablet [[id number]]  He wants to pick it up on Friday

## 2015-02-16 NOTE — H&P (Signed)
TOTAL KNEE ADMISSION H&P  Patient is being admitted for right total knee arthroplasty.  Subjective:  Chief Complaint:right knee pain.  HPI: Adrian Miller, 74 y.o. male, has a history of pain and functional disability in the right knee due to arthritis and has failed non-surgical conservative treatments for greater than 12 weeks to includeNSAID's and/or analgesics, corticosteriod injections, flexibility and strengthening excercises, use of assistive devices, weight reduction as appropriate and activity modification.  Onset of symptoms was gradual, starting 5 years ago with gradually worsening course since that time. The patient noted no past surgery on the right knee(s).  Patient currently rates pain in the right knee(s) at 10 out of 10 with activity. Patient has night pain, worsening of pain with activity and weight bearing, pain that interferes with activities of daily living, crepitus and joint swelling.  Patient has evidence of subchondral cysts, subchondral sclerosis, periarticular osteophytes and joint space narrowing by imaging studies.There is no active infection.  Patient Active Problem List   Diagnosis Date Noted  . Carpal tunnel syndrome on left 11/01/2014  . Osteoarthritis of right shoulder 04/14/2014  . Osteoarthritis of right knee 04/14/2014  . Type II diabetes mellitus with manifestations ([place]) 04/09/2013  . CAD (coronary artery disease), native coronary artery 04/09/2013  . Paresthesia and pain of right extremity 08/28/2011  . BPH (benign prostatic hyperplasia) 08/28/2011  . Lumbar disc disease 11/14/2010  . Preventative health care 11/14/2010  . COPD (chronic obstructive pulmonary disease) ([place]) 11/14/2010  . Hyperlipidemia with target LDL less than 70 11/14/2010  . Chronic LBP 11/14/2010  . Degenerative arthritis of hip 11/14/2010  . Left knee DJD 11/14/2010  . Essential hypertension, benign    Past Medical History  Diagnosis Date  . Arthritis   . Hyperlipidemia    takes Zetia and Atorvastatin daily  . BPH (benign prostatic hypertrophy)   . Hypertension     takes Amlodipine and Benicar daily  . Joint swelling   . Chronic back pain   . Urinary frequency   . Urinary urgency   . COPD (chronic obstructive pulmonary disease) ([place])     pt denies this diagnosis    Past Surgical History  Procedure Laterality Date  . Left rotator cuff repair  2002  . Hernia repair  1975  . Replacement total knee Left     No prescriptions prior to admission   Allergies  Allergen Reactions  . Asa [Aspirin]     angioedema    Social History  Substance Use Topics  . Smoking status: Current Every Day Smoker -- 0.50 packs/day for 51 years    Types: Cigarettes  . Smokeless tobacco: Former Systems developer    Quit date: 11/03/2014     Comment: quit 2 weeks ago  . Alcohol Use: No    No family history on file.   Review of Systems  Musculoskeletal: Positive for joint pain.       Right knee  All other systems reviewed and are negative.   Objective:  Physical Exam  Constitutional: He is oriented to person, place, and time. He appears well-developed and well-nourished.  HENT:  Head: Normocephalic and atraumatic.  Eyes: Pupils are equal, round, and reactive to light.  Neck: Normal range of motion.  Cardiovascular: Normal rate and regular rhythm.   Respiratory: Effort normal.  GI: Soft.  Musculoskeletal:  His right knee motion is about 0-110. He has crepitation and a moderate valgus deformity. Hip motion is good and does cause some low back pain. Straight leg  raise causes back pain only. Sensation and motor function are intact in his feet with palpable pulses on both sides.His calves are soft and nontender.  Neurological: He is alert and oriented to person, place, and time.  Skin: Skin is warm and dry.  Psychiatric: He has a normal mood and affect. His behavior is normal. Judgment and thought content normal.    Vital signs in last 24 hours:    Labs:   Estimated body  mass index is 32.92 kg/(m^2) as calculated from the following:   Height as of 01/21/15: 5\' 9"  (1.753 m).   Weight as of 01/21/15: 101.152 kg (223 lb).   Imaging Review Plain radiographs demonstrate severe degenerative joint disease of the right knee(s). The overall alignment isneutral. The bone quality appears to be good for age and reported activity level.  Assessment/Plan:  End stage primary arthritis, right knee   The patient history, physical examination, clinical judgment of the provider and imaging studies are consistent with end stage degenerative joint disease of the right knee(s) and total knee arthroplasty is deemed medically necessary. The treatment options including medical management, injection therapy arthroscopy and arthroplasty were discussed at length. The risks and benefits of total knee arthroplasty were presented and reviewed. The risks due to aseptic loosening, infection, stiffness, patella tracking problems, thromboembolic complications and other imponderables were discussed. The patient acknowledged the explanation, agreed to proceed with the plan and consent was signed. Patient is being admitted for inpatient treatment for surgery, pain control, PT, OT, prophylactic antibiotics, VTE prophylaxis, progressive ambulation and ADL's and discharge planning. The patient is planning to be discharged home with home health services

## 2015-02-16 NOTE — Telephone Encounter (Signed)
done

## 2015-02-16 NOTE — Telephone Encounter (Signed)
Pt informed - will pick up on Friday

## 2015-02-16 NOTE — Telephone Encounter (Signed)
Pt requesting RF on Hydrocodone-acetaminophen. Last rf from you was #[phone] in July. I cannot see who provided the refills is Sept. Please advise

## 2015-02-21 NOTE — Progress Notes (Signed)
Pt called and informed of time change. Pt instructed to be here at 0900 for surgery at 1100, pt voices understanding.

## 2015-02-22 ENCOUNTER — Inpatient Hospital Stay (HOSPITAL_COMMUNITY): Payer: Medicare Other | Admitting: Emergency Medicine

## 2015-02-22 ENCOUNTER — Encounter (HOSPITAL_COMMUNITY)
Admission: RE | Disposition: A | Discharge status: Home / Self Care | Payer: Self-pay | Source: Ambulatory Visit | Attending: Orthopaedic Surgery

## 2015-02-22 ENCOUNTER — Inpatient Hospital Stay (HOSPITAL_COMMUNITY)
Admission: RE | Admit: 2015-02-22 | Discharge: 2015-02-24 | DRG: 470 | Disposition: A | Discharge status: Home / Self Care | Payer: Medicare Other | Source: Ambulatory Visit | Attending: Orthopaedic Surgery | Admitting: Orthopaedic Surgery

## 2015-02-22 ENCOUNTER — Encounter (HOSPITAL_COMMUNITY): Payer: Self-pay | Admitting: General Practice

## 2015-02-22 DIAGNOSIS — Z96652 Presence of left artificial knee joint: Secondary | ICD-10-CM | POA: Diagnosis present

## 2015-02-22 DIAGNOSIS — E785 Hyperlipidemia, unspecified: Secondary | ICD-10-CM | POA: Diagnosis present

## 2015-02-22 DIAGNOSIS — Z886 Allergy status to analgesic agent status: Secondary | ICD-10-CM | POA: Diagnosis not present

## 2015-02-22 DIAGNOSIS — I10 Essential (primary) hypertension: Secondary | ICD-10-CM | POA: Diagnosis present

## 2015-02-22 DIAGNOSIS — F17200 Nicotine dependence, unspecified, uncomplicated: Secondary | ICD-10-CM | POA: Diagnosis present

## 2015-02-22 DIAGNOSIS — M1711 Unilateral primary osteoarthritis, right knee: Secondary | ICD-10-CM | POA: Diagnosis present

## 2015-02-22 DIAGNOSIS — N400 Enlarged prostate without lower urinary tract symptoms: Secondary | ICD-10-CM | POA: Diagnosis present

## 2015-02-22 DIAGNOSIS — Z23 Encounter for immunization: Secondary | ICD-10-CM | POA: Diagnosis not present

## 2015-02-22 DIAGNOSIS — M19011 Primary osteoarthritis, right shoulder: Secondary | ICD-10-CM | POA: Diagnosis present

## 2015-02-22 DIAGNOSIS — I2510 Atherosclerotic heart disease of native coronary artery without angina pectoris: Secondary | ICD-10-CM | POA: Diagnosis present

## 2015-02-22 DIAGNOSIS — G8929 Other chronic pain: Secondary | ICD-10-CM | POA: Diagnosis present

## 2015-02-22 DIAGNOSIS — E119 Type 2 diabetes mellitus without complications: Secondary | ICD-10-CM | POA: Diagnosis present

## 2015-02-22 DIAGNOSIS — M1610 Unilateral primary osteoarthritis, unspecified hip: Secondary | ICD-10-CM | POA: Diagnosis present

## 2015-02-22 HISTORY — PX: TOTAL KNEE ARTHROPLASTY: SHX125

## 2015-02-22 SURGERY — ARTHROPLASTY, KNEE, TOTAL
Anesthesia: Spinal | Site: Knee | Laterality: Right

## 2015-02-22 MED ORDER — ONDANSETRON HCL 4 MG/2ML IJ SOLN
INTRAMUSCULAR | Status: DC | PRN
  Administered 2015-02-22: 4 mg via INTRAVENOUS

## 2015-02-22 MED ORDER — ONDANSETRON HCL 4 MG/2ML IJ SOLN: 4.0000 mg | Freq: Four times a day (QID) | INTRAMUSCULAR | Status: DC | PRN

## 2015-02-22 MED ORDER — ONDANSETRON HCL 4 MG PO TABS: 4.0000 mg | ORAL_TABLET | Freq: Four times a day (QID) | ORAL | Status: DC | PRN

## 2015-02-22 MED ORDER — LIDOCAINE HCL (CARDIAC) 20 MG/ML IV SOLN
INTRAVENOUS | Status: AC
  Filled 2015-02-22: qty 5

## 2015-02-22 MED ORDER — PROPOFOL 500 MG/50ML IV EMUL
INTRAVENOUS | Status: DC | PRN
  Administered 2015-02-22: 60 ug/kg/min via INTRAVENOUS

## 2015-02-22 MED ORDER — PROPOFOL 10 MG/ML IV BOLUS
INTRAVENOUS | Status: DC | PRN
  Administered 2015-02-22 (×2): 10 mg via INTRAVENOUS
  Administered 2015-02-22: 20 mg via INTRAVENOUS

## 2015-02-22 MED ORDER — BUPIVACAINE LIPOSOME 1.3 % IJ SUSP
20.0000 mL | INTRAMUSCULAR | Status: DC
  Filled 2015-02-22: qty 20

## 2015-02-22 MED ORDER — TRANEXAMIC ACID 1000 MG/10ML IV SOLN
2000.0000 mg | INTRAVENOUS | Status: DC | PRN
  Administered 2015-02-22: 2000 mg via TOPICAL

## 2015-02-22 MED ORDER — VANCOMYCIN HCL IN DEXTROSE 1-5 GM/200ML-% IV SOLN
1000.0000 mg | Freq: Once | INTRAVENOUS | Status: AC
  Administered 2015-02-23: 1000 mg via INTRAVENOUS
  Filled 2015-02-22: qty 200

## 2015-02-22 MED ORDER — FENTANYL CITRATE (PF) 250 MCG/5ML IJ SOLN
INTRAMUSCULAR | Status: AC
  Filled 2015-02-22: qty 5

## 2015-02-22 MED ORDER — CEFAZOLIN SODIUM-DEXTROSE 2-3 GM-% IV SOLR
2.0000 g | Freq: Four times a day (QID) | INTRAVENOUS | Status: AC
  Administered 2015-02-22 (×2): 2 g via INTRAVENOUS
  Filled 2015-02-22 (×2): qty 50

## 2015-02-22 MED ORDER — SODIUM CHLORIDE 0.9 % IR SOLN
Status: DC | PRN
  Administered 2015-02-22: 3000 mL

## 2015-02-22 MED ORDER — METOCLOPRAMIDE HCL 5 MG PO TABS: 5.0000 mg | ORAL_TABLET | Freq: Three times a day (TID) | ORAL | Status: DC | PRN

## 2015-02-22 MED ORDER — 0.9 % SODIUM CHLORIDE (POUR BTL) OPTIME
TOPICAL | Status: DC | PRN
  Administered 2015-02-22: 1000 mL

## 2015-02-22 MED ORDER — METOCLOPRAMIDE HCL 5 MG/ML IJ SOLN: 5.0000 mg | Freq: Three times a day (TID) | INTRAMUSCULAR | Status: DC | PRN

## 2015-02-22 MED ORDER — CEFAZOLIN SODIUM-DEXTROSE 2-3 GM-% IV SOLR
2.0000 g | INTRAVENOUS | Status: AC
  Administered 2015-02-22: 2 g via INTRAVENOUS
  Filled 2015-02-22: qty 50

## 2015-02-22 MED ORDER — MIDAZOLAM HCL 2 MG/2ML IJ SOLN
INTRAMUSCULAR | Status: AC
  Filled 2015-02-22: qty 4

## 2015-02-22 MED ORDER — LACTATED RINGERS IV SOLN
INTRAVENOUS | Status: DC
  Administered 2015-02-22 (×3): via INTRAVENOUS

## 2015-02-22 MED ORDER — DIPHENHYDRAMINE HCL 12.5 MG/5ML PO ELIX: 12.5000 mg | ORAL_SOLUTION | ORAL | Status: DC | PRN

## 2015-02-22 MED ORDER — LIDOCAINE HCL (PF) 2 % IJ SOLN
INTRAMUSCULAR | Status: DC | PRN
  Administered 2015-02-22: 40 mg via INTRADERMAL

## 2015-02-22 MED ORDER — BUPIVACAINE-EPINEPHRINE (PF) 0.25% -1:200000 IJ SOLN
INTRAMUSCULAR | Status: DC | PRN
  Administered 2015-02-22: 20 mL

## 2015-02-22 MED ORDER — HYDROMORPHONE HCL 1 MG/ML IJ SOLN
0.5000 mg | INTRAMUSCULAR | Status: DC | PRN
  Administered 2015-02-22 – 2015-02-23 (×3): 1 mg via INTRAVENOUS
  Filled 2015-02-22 (×3): qty 1

## 2015-02-22 MED ORDER — VANCOMYCIN HCL IN DEXTROSE 1-5 GM/200ML-% IV SOLN
1000.0000 mg | INTRAVENOUS | Status: AC
  Administered 2015-02-22: 1000 mg via INTRAVENOUS
  Filled 2015-02-22: qty 200

## 2015-02-22 MED ORDER — MIDAZOLAM HCL 5 MG/5ML IJ SOLN
INTRAMUSCULAR | Status: DC | PRN
  Administered 2015-02-22: 2 mg via INTRAVENOUS

## 2015-02-22 MED ORDER — INFLUENZA VAC SPLIT QUAD 0.5 ML IM SUSY
0.5000 mL | PREFILLED_SYRINGE | INTRAMUSCULAR | Status: AC
  Administered 2015-02-24: 0.5 mL via INTRAMUSCULAR
  Filled 2015-02-22: qty 0.5

## 2015-02-22 MED ORDER — MENTHOL 3 MG MT LOZG: 1.0000 | LOZENGE | OROMUCOSAL | Status: DC | PRN

## 2015-02-22 MED ORDER — LACTATED RINGERS IV SOLN
INTRAVENOUS | Status: DC
  Administered 2015-02-22: 19:00:00 via INTRAVENOUS

## 2015-02-22 MED ORDER — EPHEDRINE SULFATE 50 MG/ML IJ SOLN
INTRAMUSCULAR | Status: DC | PRN
  Administered 2015-02-22: 5 mg via INTRAVENOUS

## 2015-02-22 MED ORDER — BUPIVACAINE-EPINEPHRINE (PF) 0.25% -1:200000 IJ SOLN
INTRAMUSCULAR | Status: AC
  Filled 2015-02-22: qty 30

## 2015-02-22 MED ORDER — ACETAMINOPHEN 650 MG RE SUPP: 650.0000 mg | Freq: Four times a day (QID) | RECTAL | Status: DC | PRN

## 2015-02-22 MED ORDER — OXYCODONE HCL 5 MG PO TABS
5.0000 mg | ORAL_TABLET | ORAL | Status: DC | PRN
  Administered 2015-02-22 – 2015-02-24 (×9): 10 mg via ORAL
  Filled 2015-02-22 (×9): qty 2

## 2015-02-22 MED ORDER — DOCUSATE SODIUM 100 MG PO CAPS
100.0000 mg | ORAL_CAPSULE | Freq: Two times a day (BID) | ORAL | Status: DC
  Administered 2015-02-22 – 2015-02-24 (×4): 100 mg via ORAL
  Filled 2015-02-22 (×4): qty 1

## 2015-02-22 MED ORDER — TRANEXAMIC ACID 1000 MG/10ML IV SOLN
2000.0000 mg | INTRAVENOUS | Status: DC
  Filled 2015-02-22: qty 20

## 2015-02-22 MED ORDER — ONDANSETRON HCL 4 MG/2ML IJ SOLN: 4.0000 mg | Freq: Once | INTRAMUSCULAR | Status: DC | PRN

## 2015-02-22 MED ORDER — ROCURONIUM BROMIDE 50 MG/5ML IV SOLN
INTRAVENOUS | Status: AC
  Filled 2015-02-22: qty 1

## 2015-02-22 MED ORDER — ACETAMINOPHEN 325 MG PO TABS
650.0000 mg | ORAL_TABLET | Freq: Four times a day (QID) | ORAL | Status: DC | PRN
  Administered 2015-02-23 – 2015-02-24 (×5): 650 mg via ORAL
  Filled 2015-02-22 (×5): qty 2

## 2015-02-22 MED ORDER — BUPIVACAINE LIPOSOME 1.3 % IJ SUSP
INTRAMUSCULAR | Status: DC | PRN
  Administered 2015-02-22: 20 mL

## 2015-02-22 MED ORDER — ENOXAPARIN SODIUM 40 MG/0.4ML ~~LOC~~ SOLN
40.0000 mg | SUBCUTANEOUS | Status: DC
  Administered 2015-02-23 – 2015-02-24 (×2): 40 mg via SUBCUTANEOUS
  Filled 2015-02-22 (×2): qty 0.4

## 2015-02-22 MED ORDER — METHOCARBAMOL 1000 MG/10ML IJ SOLN: 500.0000 mg | Freq: Four times a day (QID) | INTRAVENOUS | Status: DC | PRN

## 2015-02-22 MED ORDER — ONDANSETRON HCL 4 MG/2ML IJ SOLN
INTRAMUSCULAR | Status: AC
  Filled 2015-02-22: qty 2

## 2015-02-22 MED ORDER — PROPOFOL 10 MG/ML IV BOLUS
INTRAVENOUS | Status: AC
  Filled 2015-02-22: qty 20

## 2015-02-22 MED ORDER — AMLODIPINE BESYLATE 5 MG PO TABS
5.0000 mg | ORAL_TABLET | Freq: Every day | ORAL | Status: DC
  Administered 2015-02-23 – 2015-02-24 (×2): 5 mg via ORAL
  Filled 2015-02-22 (×3): qty 1

## 2015-02-22 MED ORDER — METHOCARBAMOL 500 MG PO TABS
500.0000 mg | ORAL_TABLET | Freq: Four times a day (QID) | ORAL | Status: DC | PRN
  Administered 2015-02-22 – 2015-02-23 (×3): 500 mg via ORAL
  Filled 2015-02-22 (×3): qty 1

## 2015-02-22 MED ORDER — ALUM & MAG HYDROXIDE-SIMETH 200-200-20 MG/5ML PO SUSP: 30.0000 mL | ORAL | Status: DC | PRN

## 2015-02-22 MED ORDER — PHENOL 1.4 % MT LIQD: 1.0000 | OROMUCOSAL | Status: DC | PRN

## 2015-02-22 MED ORDER — BISACODYL 5 MG PO TBEC
5.0000 mg | DELAYED_RELEASE_TABLET | Freq: Every day | ORAL | Status: DC | PRN
Start: 2015-02-22 — End: 2015-02-24

## 2015-02-22 MED ORDER — FENTANYL CITRATE (PF) 100 MCG/2ML IJ SOLN
INTRAMUSCULAR | Status: DC | PRN
  Administered 2015-02-22 (×4): 25 ug via INTRAVENOUS

## 2015-02-22 SURGICAL SUPPLY — 62 items
BAG DECANTER FOR FLEXI CONT (MISCELLANEOUS) ×2 IMPLANT
BANDAGE ELASTIC 4 VELCRO ST LF (GAUZE/BANDAGES/DRESSINGS) IMPLANT
BANDAGE ESMARK 6X9 LF (GAUZE/BANDAGES/DRESSINGS) ×1 IMPLANT
BENZOIN TINCTURE PRP APPL 2/3 (GAUZE/BANDAGES/DRESSINGS) ×2 IMPLANT
BLADE SAGITTAL 25.0X1.19X90 (BLADE) ×2 IMPLANT
BLADE SAW SGTL 13.0X1.19X90.0M (BLADE) IMPLANT
BLADE SURG ROTATE 9660 (MISCELLANEOUS) IMPLANT
BNDG ELASTIC 6X10 VLCR STRL LF (GAUZE/BANDAGES/DRESSINGS) ×2 IMPLANT
BNDG ESMARK 6X9 LF (GAUZE/BANDAGES/DRESSINGS) ×2
BNDG GAUZE ELAST 4 BULKY (GAUZE/BANDAGES/DRESSINGS) ×4 IMPLANT
BOWL SMART MIX CTS (DISPOSABLE) ×2 IMPLANT
CAP KNEE TOTAL 3 SIGMA ×2 IMPLANT
CEMENT HV SMART SET (Cement) ×4 IMPLANT
COVER SURGICAL LIGHT HANDLE (MISCELLANEOUS) ×2 IMPLANT
CUFF TOURNIQUET SINGLE 34IN LL (TOURNIQUET CUFF) ×2 IMPLANT
CUFF TOURNIQUET SINGLE 44IN (TOURNIQUET CUFF) IMPLANT
DECANTER SPIKE VIAL GLASS SM (MISCELLANEOUS) ×2 IMPLANT
DRAPE EXTREMITY T 121X128X90 (DRAPE) ×2 IMPLANT
DRAPE PROXIMA HALF (DRAPES) ×2 IMPLANT
DRAPE U-SHAPE 47X51 STRL (DRAPES) ×2 IMPLANT
DRSG ADAPTIC 3X8 NADH LF (GAUZE/BANDAGES/DRESSINGS) ×2 IMPLANT
DRSG PAD ABDOMINAL 8X10 ST (GAUZE/BANDAGES/DRESSINGS) ×4 IMPLANT
DURAPREP 26ML APPLICATOR (WOUND CARE) ×2 IMPLANT
ELECT REM PT RETURN 9FT ADLT (ELECTROSURGICAL) ×2
ELECTRODE REM PT RTRN 9FT ADLT (ELECTROSURGICAL) ×1 IMPLANT
GAUZE SPONGE 4X4 12PLY STRL (GAUZE/BANDAGES/DRESSINGS) ×2 IMPLANT
GLOVE BIO SURGEON STRL SZ8 (GLOVE) ×4 IMPLANT
GLOVE BIOGEL PI IND STRL 8 (GLOVE) ×2 IMPLANT
GLOVE BIOGEL PI INDICATOR 8 (GLOVE) ×2
GOWN STRL REUS W/ TWL LRG LVL3 (GOWN DISPOSABLE) ×1 IMPLANT
GOWN STRL REUS W/ TWL XL LVL3 (GOWN DISPOSABLE) ×2 IMPLANT
GOWN STRL REUS W/TWL LRG LVL3 (GOWN DISPOSABLE) ×1
GOWN STRL REUS W/TWL XL LVL3 (GOWN DISPOSABLE) ×2
HANDPIECE INTERPULSE COAX TIP (DISPOSABLE) ×1
HOOD PEEL AWAY FACE SHEILD DIS (HOOD) ×6 IMPLANT
IMMOBILIZER KNEE 22 UNIV (SOFTGOODS) ×2 IMPLANT
KIT BASIN OR (CUSTOM PROCEDURE TRAY) ×2 IMPLANT
KIT ROOM TURNOVER OR (KITS) ×2 IMPLANT
MANIFOLD NEPTUNE II (INSTRUMENTS) ×2 IMPLANT
NEEDLE HYPO 21X1 ECLIPSE (NEEDLE) IMPLANT
NS IRRIG 1000ML POUR BTL (IV SOLUTION) ×2 IMPLANT
PACK TOTAL JOINT (CUSTOM PROCEDURE TRAY) ×2 IMPLANT
PACK UNIVERSAL I (CUSTOM PROCEDURE TRAY) ×2 IMPLANT
PAD ARMBOARD 7.5X6 YLW CONV (MISCELLANEOUS) ×6 IMPLANT
SET HNDPC FAN SPRY TIP SCT (DISPOSABLE) ×1 IMPLANT
STAPLER VISISTAT 35W (STAPLE) IMPLANT
STRIP CLOSURE SKIN 1/2X4 (GAUZE/BANDAGES/DRESSINGS) ×2 IMPLANT
SUCTION FRAZIER TIP 10 FR DISP (SUCTIONS) IMPLANT
SUT MNCRL AB 3-0 PS2 18 (SUTURE) ×2 IMPLANT
SUT VIC AB 0 CT1 27 (SUTURE) ×2
SUT VIC AB 0 CT1 27XBRD ANBCTR (SUTURE) ×2 IMPLANT
SUT VIC AB 1 CT1 27 (SUTURE) ×1
SUT VIC AB 1 CT1 27XBRD ANBCTR (SUTURE) ×1 IMPLANT
SUT VIC AB 2-0 CT1 27 (SUTURE) ×2
SUT VIC AB 2-0 CT1 TAPERPNT 27 (SUTURE) ×2 IMPLANT
SUT VLOC 180 0 24IN GS25 (SUTURE) ×2 IMPLANT
SYR 50ML LL SCALE MARK (SYRINGE) ×2 IMPLANT
TOWEL OR 17X24 6PK STRL BLUE (TOWEL DISPOSABLE) ×2 IMPLANT
TOWEL OR 17X26 10 PK STRL BLUE (TOWEL DISPOSABLE) ×2 IMPLANT
TRAY CATH 16FR W/PLASTIC CATH (SET/KITS/TRAYS/PACK) ×2 IMPLANT
TRAY FOLEY CATH 14FR (SET/KITS/TRAYS/PACK) IMPLANT
WATER STERILE IRR 1000ML POUR (IV SOLUTION) IMPLANT

## 2015-02-22 NOTE — Evaluation (Signed)
Physical Therapy Evaluation Patient Details Name: Adrian Miller MRN: [id number] DOB: 1940-09-13 Today's Date: 02/22/2015   History of Present Illness  Patient is a 74 y/o male s/p Rt TKA. PMH includes HTN, HLD, COPD.  Clinical Impression  Patient presents with pain and post surgical deficits RLE s/p Rt TKA. Tolerated transfers and short distance ambulation with Min guard -Min A for balance/safety. Instructed pt in exercises. Pt lives alone but reports having good support from daughters and family. Will need to see if family can stay with pt initially for ~1 week at discharge. Will follow acutely to maximize independence and mobility prior to return home.    Follow Up Recommendations Home health PT;Supervision/Assistance - 24 hour    Equipment Recommendations  None recommended by PT    Recommendations for Other Services OT consult     Precautions / Restrictions Precautions Precautions: Knee Precaution Booklet Issued: No Precaution Comments: reviewed no pillow under knee and precautions. Restrictions Weight Bearing Restrictions: Yes RLE Weight Bearing: Weight bearing as tolerated      Mobility  Bed Mobility Overal bed mobility: Needs Assistance Bed Mobility: Supine to Sit     Supine to sit: Min guard;HOB elevated     General bed mobility comments: Cues for technique. Use of rails for support.  Transfers Overall transfer level: Needs assistance Equipment used: Rolling walker (2 wheeled) Transfers: Sit to/from Stand Sit to Stand: Min guard;Min assist         General transfer comment: Stoodf rom EOB x5 due to assisting with pericare. Cues for hand placement.   Ambulation/Gait Ambulation/Gait assistance: Min guard Ambulation Distance (Feet): 7 Feet Assistive device: Rolling walker (2 wheeled) Gait Pattern/deviations: Step-to pattern;Decreased stance time - right;Decreased step length - left;Trunk flexed;Decreased stride length Gait velocity: decreased   General  Gait Details: Slow, steady gait with increased knee flexion throughout gait cycle RLE.  Stairs            Wheelchair Mobility    Modified Rankin (Stroke Patients Only)       Balance Overall balance assessment: Needs assistance Sitting-balance support: Feet supported;No upper extremity supported Sitting balance-Leahy Scale: Good Sitting balance - Comments: Able to reach outside BoS and wipe up floor without LOB.   Standing balance support: During functional activity Standing balance-Leahy Scale: Fair Standing balance comment: Able to perform pericare in standing and reach around to bottom without LOB.                             Pertinent Vitals/Pain Pain Assessment: Faces Faces Pain Scale: Hurts a little bit Pain Location: right knee Pain Descriptors / Indicators: Sore Pain Intervention(s): Monitored during session;Repositioned;Premedicated before session    Home Living Family/patient expects to be discharged to:: Private residence Living Arrangements: Alone Available Help at Discharge: Family;Available PRN/intermittently Type of Home: House Home Access: Stairs to enter Entrance Stairs-Rails: Can reach both;Right;Left Entrance Stairs-Number of Steps: 1 Home Layout: One level Home Equipment: Walker - 2 wheels;Cane - single point      Prior Function Level of Independence: Independent with assistive device(s)         Comments: uses SPC PRN when in community.     Hand Dominance        Extremity/Trunk Assessment   Upper Extremity Assessment: Defer to OT evaluation           Lower Extremity Assessment: RLE deficits/detail RLE Deficits / Details: Limited AROM/strength secondary to pain/surgery  Communication   Communication: No difficulties  Cognition Arousal/Alertness: Awake/alert Behavior During Therapy: WFL for tasks assessed/performed Overall Cognitive Status: Within Functional Limits for tasks assessed                       General Comments General comments (skin integrity, edema, etc.): Family members stepped outside room during session. Pt found incontinent of stool. Performed clean up.    Exercises Total Joint Exercises Ankle Circles/Pumps: Both;10 reps;Supine Quad Sets: Both;10 reps;Supine      Assessment/Plan    PT Assessment Patient needs continued PT services  PT Diagnosis Difficulty walking;Acute pain;Generalized weakness   PT Problem List Decreased strength;Pain;Decreased range of motion;Impaired sensation;Decreased activity tolerance;Decreased balance;Decreased mobility  PT Treatment Interventions Balance training;Gait training;Stair training;Therapeutic activities;Therapeutic exercise;Functional mobility training;Patient/family education   PT Goals (Current goals can be found in the Care Plan section) Acute Rehab PT Goals Patient Stated Goal: to go home PT Goal Formulation: With patient Time For Goal Achievement: 03/08/15 Potential to Achieve Goals: Fair    Frequency 7X/week   Barriers to discharge Decreased caregiver support Pt lives alone    Co-evaluation               End of Session Equipment Utilized During Treatment: Gait belt Activity Tolerance: Patient tolerated treatment well Patient left: in chair;with call bell/phone within reach;with family/visitor present Nurse Communication: Mobility status         Time: 0165-5374 PT Time Calculation (min) (ACUTE ONLY): 31 min   Charges:   PT Evaluation $Initial PT Evaluation Tier I: 1 Procedure PT Treatments $Therapeutic Activity: 8-22 mins   PT G Codes:        Raia Amico A Delonda Coley 02/22/2015, 5:21 PM Wray Kearns, [place], DPT [phone]

## 2015-02-22 NOTE — Interval H&P Note (Signed)
OK for surgery PD 

## 2015-02-22 NOTE — Progress Notes (Signed)
Orthopedic Tech Progress Note Patient Details:  Adrian Miller [birth year] [id number] Applied CPM to RLE.  Applied OHF with trapeze to pt.'s bed.  Left Bone Foam with pt.'s nurse. CPM Right Knee CPM Right Knee: On Right Knee Flexion (Degrees): 90 Right Knee Extension (Degrees): 0   Darrol Poke 02/22/2015, 1:54 PM

## 2015-02-22 NOTE — Transfer of Care (Signed)
Immediate Anesthesia Transfer of Care Note  Patient: Adrian Miller  Procedure(s) Performed: Procedure(s): TOTAL KNEE ARTHROPLASTY (Right)  Patient Location: PACU  Anesthesia Type:MAC and Spinal  Level of Consciousness: awake, alert , patient cooperative and responds to stimulation  Airway & Oxygen Therapy: Patient Spontanous Breathing  Post-op Assessment: Report given to RN and Post -op Vital signs reviewed and stable  Post vital signs: stable  Last Vitals:  Filed Vitals:   02/22/15 0736  BP: 161/87  Pulse: 63  Temp: 36.1 C  Resp: 20    Complications: No apparent anesthesia complications

## 2015-02-22 NOTE — Progress Notes (Signed)
Orthopedic Tech Progress Note Patient Details:  [name] [birth year] [id number] On cpm at 7:15 pm Patient ID: Adrian Miller, male   DOB: Jul 24, 1940, 74 y.o.   MRN: [id number]   Braulio Bosch 02/22/2015, 7:18 PM

## 2015-02-22 NOTE — Anesthesia Procedure Notes (Signed)
Spinal Patient location during procedure: OR Start time: 02/22/2015 11:20 AM End time: 02/22/2015 11:22 AM Staffing Performed by: anesthesiologist  Spinal Block Patient position: sitting Prep: DuraPrep Patient monitoring: heart rate, cardiac monitor, continuous pulse ox and blood pressure Approach: midline Location: L3-4 Injection technique: single-shot Needle Needle type: Quincke  Needle gauge: 22 G Needle length: 9 cm Needle insertion depth: 4 cm Assessment Sensory level: T8 Additional Notes Sitting Sterile prep lower back. 22ga needle Csf clear free flow. 10mg  Marcaine ( 0.75%) w/ epi w/o difficulty. Pt tolerated well. GES

## 2015-02-22 NOTE — Op Note (Signed)
PREOP DIAGNOSIS: DJD RIGHT KNEE POSTOP DIAGNOSIS: same PROCEDURE: RIGHT TKR ANESTHESIA: Spinal and block ATTENDING SURGEON: Jere Vanburen G ASSISTANT: Loni Dolly PA  INDICATIONS FOR PROCEDURE: Adrian Miller is a 74 y.o. male who has struggled for a long time with pain due to degenerative arthritis of the right knee.  The patient has failed many conservative non-operative measures and at this point has pain which limits the ability to sleep and walk.  The patient is offered total knee replacement.  Informed operative consent was obtained after discussion of possible risks of anesthesia, infection, neurovascular injury, DVT, and death.  The importance of the post-operative rehabilitation protocol to optimize result was stressed extensively with the patient.  SUMMARY OF FINDINGS AND PROCEDURE:  Adrian Miller was taken to the operative suite where under the above anesthesia a right knee replacement was performed.  There were advanced degenerative changes and the bone quality was excellent.  We used the DePuy LCS system and placed size large femur, 5 tibia, 38 mm all polyethylene patella, and a size 10 mm spacer.  Loni Dolly PA-C assisted throughout and was invaluable to the completion of the case in that he helped retract and maintain exposure while I placed components.  He also helped close thereby minimizing OR time.  The patient was admitted for appropriate post-op care to include perioperative antibiotics and mechanical and pharmacologic measures for DVT prophylaxis.  DESCRIPTION OF PROCEDURE:  Adrian Miller was taken to the operative suite where the above anesthesia was applied.  The patient was positioned supine and prepped and draped in normal sterile fashion.  An appropriate time out was performed.  After the administration of kefzol and vancomycin pre-op antibiotic the leg was elevated and exsanguinated and a tourniquet inflated. A standard longitudinal incision was made on the anterior knee.   Dissection was carried down to the extensor mechanism.  All appropriate anti-infective measures were used including the pre-operative antibiotic, betadine impregnated drape, and closed hooded exhaust systems for each member of the surgical team.  A medial parapatellar incision was made in the extensor mechanism and the knee cap flipped and the knee flexed.  Some residual meniscal tissues were removed along with any remaining ACL/PCL tissue.  A guide was placed on the tibia and a flat cut was made on it's superior surface.  An intramedullary guide was placed in the femur and was utilized to make anterior and posterior cuts creating an appropriate flexion gap.  A second intramedullary guide was placed in the femur to make a distal cut properly balancing the knee with an extension gap equal to the flexion gap.  The three bones sized to the above mentioned sizes and the appropriate guides were placed and utilized.  A trial reduction was done and the knee easily came to full extension and the patella tracked well on flexion.  The trial components were removed and all bones were cleaned with pulsatile lavage and then dried thoroughly.  Cement was mixed and was pressurized onto the bones followed by placement of the aforementioned components.  Excess cement was trimmed and pressure was held on the components until the cement had hardened.  The tourniquet was deflated and a small amount of bleeding was controlled with cautery and pressure.  The knee was irrigated thoroughly.  The extensor mechanism was re-approximated with V-loc suture in running fashion.  The knee was flexed and the repair was solid.  The subcutaneous tissues were re-approximated with #0 and #2-0 vicryl and the skin closed  with a subcuticular stitch and steristrips.  A sterile dressing was applied.  Intraoperative fluids, EBL, and tourniquet time can be obtained from anesthesia records.  DISPOSITION:  The patient was taken to recovery room in stable  condition and admitted for appropriate post-op care to include peri-operative antibiotic and DVT prophylaxis with mechanical and pharmacologic measures.  Babs Dabbs G 02/22/2015, 12:50 PM

## 2015-02-22 NOTE — Anesthesia Postprocedure Evaluation (Signed)
  Anesthesia Post-op Note  Patient: Adrian Miller  Procedure(s) Performed: Procedure(s): TOTAL KNEE ARTHROPLASTY (Right)  Patient Location: PACU  Anesthesia Type:Spinal  Level of Consciousness: awake, alert , oriented and patient cooperative  Airway and Oxygen Therapy: Patient Spontanous Breathing  Post-op Pain: none  Post-op Assessment: Post-op Vital signs reviewed, Patient's Cardiovascular Status Stable, Respiratory Function Stable, Patent Airway, No signs of Nausea or vomiting, Pain level controlled and Spinal receding LLE Motor Response: No movement due to regional block   RLE Motor Response: No movement due to regional block   L Sensory Level: T10-Umbilical region R Sensory Level: T10-Umbilical region  Post-op Vital Signs: stable  Last Vitals:  Filed Vitals:   02/22/15 1329  BP:   Pulse:   Temp: 36.6 C  Resp:     Complications: No apparent anesthesia complications

## 2015-02-22 NOTE — Anesthesia Preprocedure Evaluation (Signed)
Anesthesia Evaluation  Patient identified by MRN, date of birth, ID band  Reviewed: Allergy & Precautions, NPO status , Patient's Chart, lab work & pertinent test results, reviewed documented beta blocker date and time   Airway Mallampati: II  TM Distance: >3 FB     Dental   Pulmonary COPD, Current Smoker,     + decreased breath sounds      Cardiovascular hypertension, + CAD   Rhythm:Regular Rate:Normal     Neuro/Psych  Neuromuscular disease    GI/Hepatic   Endo/Other  diabetes, Type 2  Renal/GU      Musculoskeletal  (+) Arthritis ,   Abdominal   Peds  Hematology   Anesthesia Other Findings   Reproductive/Obstetrics                             Anesthesia Physical Anesthesia Plan  ASA: III  Anesthesia Plan: Spinal   Post-op Pain Management:    Induction: Intravenous  Airway Management Planned: Mask  Additional Equipment:   Intra-op Plan:   Post-operative Plan:   Informed Consent: I have reviewed the patients History and Physical, chart, labs and discussed the procedure including the risks, benefits and alternatives for the proposed anesthesia with the patient or authorized representative who has indicated his/her understanding and acceptance.     Plan Discussed with: CRNA, Anesthesiologist and Surgeon  Anesthesia Plan Comments:         Anesthesia Quick Evaluation

## 2015-02-23 ENCOUNTER — Encounter (HOSPITAL_COMMUNITY): Payer: Self-pay | Admitting: Orthopaedic Surgery
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
## 2015-02-23 NOTE — Clinical Social Work Note (Signed)
CSW received referral for SNF.  Case discussed with case manager and plan is to discharge home.  CSW to sign off please re-consult if social work needs arise.  Jaimya Feliciano R. Kayleen Alig, MSW, LCSWA [phone]  

## 2015-02-23 NOTE — Progress Notes (Signed)
Utilization review completed.  
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
## 2015-02-23 NOTE — Progress Notes (Signed)
Physical Therapy Treatment Patient Details Name: Adrian Miller MRN: [id number] DOB: 03-22-1941 Today's Date: 02/23/2015    History of Present Illness Patient is a 74 y/o male s/p Rt TKA. PMH includes HTN, HLD, COPD.    PT Comments    Pain significantly limiting mobility today, and pt required max encouragement, but he was able to increase his distance walked; Noted KI in room (order was for KI, then to [place]) -- it was useful to increase Adrian Miller confidence with walking  Need to work on quad activation and knee control in stance and against gravity   Follow Up Recommendations  Home health PT;Supervision/Assistance - 24 hour     Equipment Recommendations  None recommended by PT    Recommendations for Other Services OT consult     Precautions / Restrictions Precautions Precautions: Knee Precaution Comments: reviewed no pillow under knee and precautions. Required Braces or Orthoses: Knee Immobilizer - Right Knee Immobilizer - Right: Other (comment) (KI in room, order was for KI, then discontinued in same order set) Restrictions RLE Weight Bearing: Weight bearing as tolerated    Mobility  Bed Mobility Overal bed mobility: Needs Assistance Bed Mobility: Supine to Sit     Supine to sit: Supervision     General bed mobility comments: Cues for technique. Use of rails for support.  Transfers Overall transfer level: Needs assistance Equipment used: Rolling walker (2 wheeled) Transfers: Sit to/from Stand Sit to Stand: Mod assist         General transfer comment: Mod assist to power up; cues for safety and hand placement; noted decr control of stand to sit  Ambulation/Gait Ambulation/Gait assistance: Min assist;+2 safety/equipment (nurse tech pushed chair behind) Ambulation Distance (Feet): 30 Feet Assistive device: Rolling walker (2 wheeled) Gait Pattern/deviations: Step-to pattern;Decreased step length - left;Decreased stance time - right Gait velocity:  decreased   General Gait Details: Very painful today, requiring mod assist to advance RLE and max encouragement to incr amb distance   Stairs            Wheelchair Mobility    Modified Rankin (Stroke Patients Only)       Balance             Standing balance-Leahy Scale: Fair                      Cognition Arousal/Alertness: Awake/alert Behavior During Therapy: WFL for tasks assessed/performed Overall Cognitive Status: Within Functional Limits for tasks assessed                      Exercises Total Joint Exercises Ankle Circles/Pumps: Both;10 reps;Supine Quad Sets: AROM;Right;10 reps (minimal quad activation noted) Heel Slides: AAROM;Right;10 reps Straight Leg Raises: AAROM;Right;10 reps Goniometric ROM: 5-40    General Comments        Pertinent Vitals/Pain Pain Assessment: 0-10 Pain Score: 10-Worst pain ever Faces Pain Scale: Hurts whole lot Pain Location: Right knee with therex Pain Descriptors / Indicators: Aching;Grimacing Pain Intervention(s): Limited activity within patient's tolerance;Monitored during session    Home Living                      Prior Function            PT Goals (current goals can now be found in the care plan section) Acute Rehab PT Goals Patient Stated Goal: to go home PT Goal Formulation: With patient Time For Goal Achievement: 03/08/15 Potential to Achieve Goals:  Fair Progress towards PT goals: Progressing toward goals    Frequency  7X/week    PT Plan Current plan remains appropriate    Co-evaluation             End of Session Equipment Utilized During Treatment: Gait belt;Right knee immobilizer Activity Tolerance: Patient limited by pain Patient left: in chair;with call bell/phone within reach     Time: 9811-9147 PT Time Calculation (min) (ACUTE ONLY): 35 min  Charges:  $Gait Training: 8-22 mins $Therapeutic Exercise: 8-22 mins                    G Codes:       Quin Hoop 02/23/2015, 4:11 PM  Roney Marion, [place] Pager [phone] Office [phone]

## 2015-02-24 DIAGNOSIS — M1711 Unilateral primary osteoarthritis, right knee: Secondary | ICD-10-CM | POA: Diagnosis not present

## 2015-02-24 MED ORDER — ENOXAPARIN SODIUM 40 MG/0.4ML ~~LOC~~ SOLN: 40.0000 mg | SUBCUTANEOUS | Status: DC

## 2015-02-24 MED ORDER — METHOCARBAMOL 500 MG PO TABS: 500.0000 mg | ORAL_TABLET | Freq: Four times a day (QID) | ORAL | Status: DC | PRN

## 2015-02-24 MED ORDER — OXYCODONE-ACETAMINOPHEN 5-325 MG PO TABS: 1.0000 | ORAL_TABLET | ORAL | Status: DC | PRN

## 2015-02-24 NOTE — Discharge Summary (Signed)
Patient ID: Adrian Miller MRN: [id number] DOB/AGE: 73-Oct-1942 73 y.o.  Admit date: 02/22/2015 Discharge date: 02/24/2015  Admission Diagnoses:  Principal Problem:   Primary osteoarthritis of right knee   Discharge Diagnoses:  Same  Past Medical History  Diagnosis Date  . Arthritis   . Hyperlipidemia     takes Zetia and Atorvastatin daily  . BPH (benign prostatic hypertrophy)   . Hypertension     takes Amlodipine and Benicar daily  . Joint swelling   . Chronic back pain   . Urinary frequency   . Urinary urgency   . COPD (chronic obstructive pulmonary disease) ([place])     pt denies this diagnosis    Surgeries: Procedure(s): TOTAL KNEE ARTHROPLASTY on 02/22/2015   Consultants:    Discharged Condition: Improved  Hospital Course: Adrian Miller is an 74 y.o. male who was admitted 02/22/2015 for operative treatment ofPrimary osteoarthritis of right knee. Patient has severe unremitting pain that affects sleep, daily activities, and work/hobbies. After pre-op clearance the patient was taken to the operating room on 02/22/2015 and underwent  Procedure(s): TOTAL KNEE ARTHROPLASTY.    Patient was given perioperative antibiotics: Anti-infectives    Start     Dose/Rate Route Frequency Ordered Stop   02/22/15 2345  vancomycin (VANCOCIN) IVPB 1000 mg/200 mL premix     1,000 mg 200 mL/hr over 60 Minutes Intravenous  Once 02/22/15 1555 02/23/15 0122   02/22/15 1730  ceFAZolin (ANCEF) IVPB 2 g/50 mL premix     2 g 100 mL/hr over 30 Minutes Intravenous Every 6 hours 02/22/15 1555 02/22/15 2306   02/22/15 1130  vancomycin (VANCOCIN) IVPB 1000 mg/200 mL premix     1,000 mg 200 mL/hr over 60 Minutes Intravenous To Surgery 02/22/15 1129 02/22/15 1230   02/22/15 0715  ceFAZolin (ANCEF) IVPB 2 g/50 mL premix     2 g 100 mL/hr over 30 Minutes Intravenous On call to O.R. 02/22/15 0716 02/22/15 1112       Patient was given sequential compression devices, early ambulation, and  chemoprophylaxis to prevent DVT.  Patient benefited maximally from hospital stay and there were no complications.    Recent vital signs: Patient Vitals for the past 24 hrs:  BP Temp Temp src Pulse Resp SpO2  02/24/15 0548 - 99.6 F (37.6 C) Oral - - -  02/24/15 0458 (!) 163/61 mmHg (!) 101.1 F (38.4 C) Oral 83 16 93 %  02/24/15 0200 - (!) 101 F (38.3 C) - - - -  02/23/15 2223 (!) 169/73 mmHg (!) 101.6 F (38.7 C) Oral 79 16 95 %  02/23/15 1500 107/64 mmHg 98.7 F (37.1 C) Oral 71 16 100 %  02/23/15 1453 (!) 108/42 mmHg (!) 101.2 F (38.4 C) Oral 73 16 100 %     Recent laboratory studies: No results for input(s): WBC, HGB, HCT, PLT, NA, K, CL, CO2, BUN, CREATININE, GLUCOSE, INR, CALCIUM in the last 72 hours.  Invalid input(s): PT, 2   Discharge Medications:     Medication List    STOP taking these medications        HYDROcodone-acetaminophen 10-325 MG tablet  Commonly known as:  NORCO      TAKE these medications        acetaminophen 500 MG tablet  Commonly known as:  TYLENOL  Take 1,000 mg by mouth every 8 (eight) hours as needed for mild pain or moderate pain.     amLODipine 5 MG tablet  Commonly known as:  NORVASC  Take 1 tablet (5 mg total) by mouth daily.     enoxaparin 40 MG/0.4ML injection  Commonly known as:  LOVENOX  Inject 0.4 mLs (40 mg total) into the skin daily.     methocarbamol 500 MG tablet  Commonly known as:  ROBAXIN  Take 1 tablet (500 mg total) by mouth every 6 (six) hours as needed for muscle spasms.     oxyCODONE-acetaminophen 5-325 MG tablet  Commonly known as:  ROXICET  Take 1-2 tablets by mouth every 4 (four) hours as needed for severe pain.        Diagnostic Studies: Dg Chest 2 View  02/11/2015  CLINICAL DATA:  Preoperative study prior to knee surgery ; history of Celsius OPD knee, diabetes, coronary artery disease. EXAM: CHEST  2 VIEW COMPARISON:  PA and lateral chest x-ray of January 23, 2014 FINDINGS: The lungs are adequately  inflated. There is no focal infiltrate. The interstitial markings are coarse but stable. There is no pleural effusion or pneumothorax. The heart and pulmonary vascularity are normal. There is mild tortuosity of the descending thoracic aorta. The mediastinum is normal in width. There is mild multilevel degenerative disc disease of the thoracic spine. IMPRESSION: COPD with mild stable interstitial prominent. There is no CHF, pneumonia, nor other acute cardiopulmonary abnormality. Electronically Signed   By: Ryoma  Martinique M.D.   On: 02/11/2015 09:58    Disposition: 01-Home or Self Care      Discharge Instructions    Call MD / Call 911    Complete by:  As directed   If you experience chest pain or shortness of breath, CALL 911 and be transported to the hospital emergency room.  If you develope a fever above 101 F, pus (white drainage) or increased drainage or redness at the wound, or calf pain, call your surgeon's office.     Constipation Prevention    Complete by:  As directed   Drink plenty of fluids.  Prune juice may be helpful.  You may use a stool softener, such as Colace (over the counter) 100 mg twice a day.  Use MiraLax (over the counter) for constipation as needed.     Diet - low sodium heart healthy    Complete by:  As directed      Discharge instructions    Complete by:  As directed   INSTRUCTIONS AFTER JOINT REPLACEMENT   Remove items at home which could result in a fall. This includes throw rugs or furniture in walking pathways ICE to the affected joint every three hours while awake for 30 minutes at a time, for at least the first 3-5 days, and then as needed for pain and swelling.  Continue to use ice for pain and swelling. You may notice swelling that will progress down to the foot and ankle.  This is normal after surgery.  Elevate your leg when you are not up walking on it.   Continue to use the breathing machine you got in the hospital (incentive spirometer) which will help keep  your temperature down.  It is common for your temperature to cycle up and down following surgery, especially at night when you are not up moving around and exerting yourself.  The breathing machine keeps your lungs expanded and your temperature down.   DIET:  As you were doing prior to hospitalization, we recommend a well-balanced diet.  DRESSING / WOUND CARE / SHOWERING  You may shower 3 days after surgery, but keep the wounds dry during showering.  You may use an occlusive plastic wrap (Press'n Seal for example), NO SOAKING/SUBMERGING IN THE BATHTUB.  If the bandage gets wet, change with a clean dry gauze.  If the incision gets wet, pat the wound dry with a clean towel.  ACTIVITY  Increase activity slowly as tolerated, but follow the weight bearing instructions below.   No driving for 6 weeks or until further direction given by your physician.  You cannot drive while taking narcotics.  No lifting or carrying greater than 10 lbs. until further directed by your surgeon. Avoid periods of inactivity such as sitting longer than an hour when not asleep. This helps prevent blood clots.  You may return to work once you are authorized by your doctor.     WEIGHT BEARING   Weight bearing as tolerated with assist device (walker, cane, etc) as directed, use it as long as suggested by your surgeon or therapist, typically at least 4-6 weeks.   EXERCISES  Results after joint replacement surgery are often greatly improved when you follow the exercise, range of motion and muscle strengthening exercises prescribed by your doctor. Safety measures are also important to protect the joint from further injury. Any time any of these exercises cause you to have increased pain or swelling, decrease what you are doing until you are comfortable again and then slowly increase them. If you have problems or questions, call your caregiver or physical therapist for advice.   Rehabilitation is important following a joint  replacement. After just a few days of immobilization, the muscles of the leg can become weakened and shrink (atrophy).  These exercises are designed to build up the tone and strength of the thigh and leg muscles and to improve motion. Often times heat used for twenty to thirty minutes before working out will loosen up your tissues and help with improving the range of motion but do not use heat for the first two weeks following surgery (sometimes heat can increase post-operative swelling).   These exercises can be done on a training (exercise) mat, on the floor, on a table or on a bed. Use whatever works the best and is most comfortable for you.    Use music or television while you are exercising so that the exercises are a pleasant break in your day. This will make your life better with the exercises acting as a break in your routine that you can look forward to.   Perform all exercises about fifteen times, three times per day or as directed.  You should exercise both the operative leg and the other leg as well.   Exercises include:   Quad Sets - Tighten up the muscle on the front of the thigh (Quad) and hold for 5-10 seconds.   Straight Leg Raises - With your knee straight (if you were given a brace, keep it on), lift the leg to 60 degrees, hold for 3 seconds, and slowly lower the leg.  Perform this exercise against resistance later as your leg gets stronger.  Leg Slides: Lying on your back, slowly slide your foot toward your buttocks, bending your knee up off the floor (only go as far as is comfortable). Then slowly slide your foot back down until your leg is flat on the floor again.  Angel Wings: Lying on your back spread your legs to the side as far apart as you can without causing discomfort.  Hamstring Strength:  Lying on your back, push your heel against the floor with your leg straight  by tightening up the muscles of your buttocks.  Repeat, but this time bend your knee to a comfortable angle, and  push your heel against the floor.  You may put a pillow under the heel to make it more comfortable if necessary.   A rehabilitation program following joint replacement surgery can speed recovery and prevent re-injury in the future due to weakened muscles. Contact your doctor or a physical therapist for more information on knee rehabilitation.    CONSTIPATION  Constipation is defined medically as fewer than three stools per week and severe constipation as less than one stool per week.  Even if you have a regular bowel pattern at home, your normal regimen is likely to be disrupted due to multiple reasons following surgery.  Combination of anesthesia, postoperative narcotics, change in appetite and fluid intake all can affect your bowels.   YOU MUST use at least one of the following options; they are listed in order of increasing strength to get the job done.  They are all available over the counter, and you may need to use some, POSSIBLY even all of these options:    Drink plenty of fluids (prune juice may be helpful) and high fiber foods Colace 100 mg by mouth twice a day  Senokot for constipation as directed and as needed Dulcolax (bisacodyl), take with full glass of water  Miralax (polyethylene glycol) once or twice a day as needed.  If you have tried all these things and are unable to have a bowel movement in the first 3-4 days after surgery call either your surgeon or your primary doctor.    If you experience loose stools or diarrhea, hold the medications until you stool forms back up.  If your symptoms do not get better within 1 week or if they get worse, check with your doctor.  If you experience "the worst abdominal pain ever" or develop nausea or vomiting, please contact the office immediately for further recommendations for treatment.   ITCHING:  If you experience itching with your medications, try taking only a single pain pill, or even half a pain pill at a time.  You can also use  Benadryl over the counter for itching or also to help with sleep.   TED HOSE STOCKINGS:  Use stockings on both legs until for at least 2 weeks or as directed by physician office. They may be removed at night for sleeping.  MEDICATIONS:  See your medication summary on the "After Visit Summary" that nursing will review with you.  You may have some home medications which will be placed on hold until you complete the course of blood thinner medication.  It is important for you to complete the blood thinner medication as prescribed.  PRECAUTIONS:  If you experience chest pain or shortness of breath - call 911 immediately for transfer to the hospital emergency department.   If you develop a fever greater that 101 F, purulent drainage from wound, increased redness or drainage from wound, foul odor from the wound/dressing, or calf pain - CONTACT YOUR SURGEON.                                                   FOLLOW-UP APPOINTMENTS:  If you do not already have a post-op appointment, please call the office for an appointment to be seen by your surgeon.  Guidelines for how soon to be seen are listed in your "After Visit Summary", but are typically between 1-4 weeks after surgery.  OTHER INSTRUCTIONS:   Knee Replacement:  Do not place pillow under knee, focus on keeping the knee straight while resting. CPM instructions: 0-90 degrees, 2 hours in the morning, 2 hours in the afternoon, and 2 hours in the evening. Place foam block, curve side up under heel at all times except when in CPM or when walking.  DO NOT modify, tear, cut, or change the foam block in any way.  MAKE SURE YOU:  Understand these instructions.  Get help right away if you are not doing well or get worse.    Thank you for letting us be a part of your medical care team.  It is a privilege we respect greatly.  We hope these instructions will help you stay on track for a fast and full recovery!     Increase activity slowly as tolerated     Complete by:  As directed            Follow-up Information    Follow up with Hessie Dibble, MD. Schedule an appointment as soon as possible for a visit in 2 weeks.   Specialty:  Orthopedic Surgery   Contact information:   [place] [address] [phone]        Signed: Rich Fuchs 02/24/2015, 8:21 AM

## 2015-02-24 NOTE — Progress Notes (Signed)
Occupational Therapy Treatment Patient Details Name: Adrian Miller MRN: [id number] DOB: 07-25-1940 Today's Date: 02/24/2015    History of present illness Patient is a 74 y/o male s/p Rt TKA. PMH includes HTN, HLD, COPD.   OT comments  Patient progressing very slowly towards OT goals. He requires assistance for all transfers, bathing, dressing, toileting at this time. He reports his family will assist 24/7.   Follow Up Recommendations  Home health OT;Supervision/Assistance - 24 hour    Equipment Recommendations  3 in 1 bedside comode  -- patient states the one he has is missing parts   Recommendations for Other Services      Precautions / Restrictions Precautions Precautions: Knee Required Braces or Orthoses: Knee Immobilizer - Right Restrictions Weight Bearing Restrictions: Yes RLE Weight Bearing: Weight bearing as tolerated       Mobility Bed Mobility Overal bed mobility: Needs Assistance Bed Mobility: Sit to Supine       Sit to supine: Min assist;HOB elevated   General bed mobility comments: assistance for RLE into elevated bed.  Transfers Overall transfer level: Needs assistance Equipment used: Rolling walker (2 wheeled) Transfers: Sit to/from Stand Sit to Stand: Mod assist;Min assist (mod A from recliner; min A from elevated bed and BSC)         General transfer comment: Increased time and cues for safety/hand placement. Tends to "plop" with sitting -- cues to not do that.    Balance                                   ADL Overall ADL's : Needs assistance/impaired Eating/Feeding: Independent   Grooming: Set up;Sitting               Lower Body Dressing: Moderate assistance;Sit to/from stand   Toilet Transfer: Minimal assistance;BSC;RW;Ambulation           Functional mobility during ADLs: Minimal assistance;Moderate assistance;Cueing for safety;Rolling walker;Cueing for sequencing General ADL Comments: Patient reports his family  will assist with LB self-care "until I can do it myself." Denies need for AE at this time. Patient required moderate assistance sit to stand from recliner. Min A from elevated bed, min A from 3 in 1 over toilet. Upon standing the first time, he became dizzy. Had to sit back on chair. Upon standing second time, patient also felt dizzy. Sat on elevated bed instead. He stood a third time and was able to ambulate to/from bathroom with RW with cues for safety and sequencing. Patient tends to let go of walker and reach for surface he is going to prior to turning fully. He also tried to lean down on elbows on walker due to fatigue on his way to the bathroom. Extensive cues for safe techniques and to not lean on elbows. Patient also tends to get RW to far out in front of him. Education and cues regarding this as well. Patient performed toilet transfer, then ambulated back to bed to rest. We briefly discussed SNF rehab, and patient reported he wishes to go home. He reports he may go home today. Recommend HHOT at discharge.      Vision                     Perception     Praxis      Cognition   Behavior During Therapy: [facility] for tasks assessed/performed Overall Cognitive Status: Within Functional Limits for tasks assessed  Extremity/Trunk Assessment               Exercises     Shoulder Instructions       General Comments      Pertinent Vitals/ Pain       Pain Assessment: 0-10 Pain Score: 3  Pain Location: R knee Pain Descriptors / Indicators: Aching;Sore Pain Intervention(s): Limited activity within patient's tolerance;Monitored during session  Home Living                                          Prior Functioning/Environment              Frequency Min 2X/week     Progress Toward Goals  OT Goals(current goals can now be found in the care plan section)  Progress towards OT goals: Progressing toward goals  Acute Rehab  OT Goals Patient Stated Goal: to go home  Plan Discharge plan remains appropriate    Co-evaluation                 End of Session Equipment Utilized During Treatment: Rolling walker;Right knee immobilizer   Activity Tolerance Patient tolerated treatment well   Patient Left in bed;with call bell/phone within reach   Nurse Communication          Time: 4034-7425 OT Time Calculation (min): 26 min  Charges: OT General Charges $OT Visit: 1 Procedure OT Treatments $Self Care/Home Management : 23-37 mins  Adrian Miller A 02/24/2015, 9:23 AM

## 2015-02-24 NOTE — Progress Notes (Signed)
Subjective: 2 Days Post-Op Procedure(s) (LRB): TOTAL KNEE ARTHROPLASTY (Right)  Activity level:  wbat Diet tolerance:  ok Voiding:  ok Patient reports pain as mild.    Objective: Vital signs in last 24 hours: Temp:  [98.7 F (37.1 C)-101.6 F (38.7 C)] 99.6 F (37.6 C) (10/20 0548) Pulse Rate:  [71-83] 83 (10/20 0458) Resp:  [16] 16 (10/20 0458) BP: (107-169)/(42-73) 163/61 mmHg (10/20 0458) SpO2:  [93 %-100 %] 93 % (10/20 0458)  Labs: No results for input(s): HGB in the last 72 hours. No results for input(s): WBC, RBC, HCT, PLT in the last 72 hours. No results for input(s): NA, K, CL, CO2, BUN, CREATININE, GLUCOSE, CALCIUM in the last 72 hours. No results for input(s): LABPT, INR in the last 72 hours.  Physical Exam:  Neurologically intact ABD soft Neurovascular intact Sensation intact distally Intact pulses distally Dorsiflexion/Plantar flexion intact Incision: dressing C/D/I and scant drainage No cellulitis present Compartment soft  Assessment/Plan:  2 Days Post-Op Procedure(s) (LRB): TOTAL KNEE ARTHROPLASTY (Right) Advance diet Up with therapy Discharge home with home health today if he does well with PT this morning. He will continue on Lovenox  For 2 weeks for DVT prevention. Follow up in office 2 weeks post op. Dressing changed to aquacel this morning.  Adrian Miller, Larwance Sachs 02/24/2015, 8:17 AM

## 2015-02-24 NOTE — Progress Notes (Signed)
Physical Therapy Treatment Patient Details Name: Adrian Miller MRN: [id number] DOB: Mar 27, 1941 Today's Date: 02/24/2015    History of Present Illness Patient is a 74 y/o male s/p Rt TKA. PMH includes HTN, HLD, COPD.    PT Comments    Pt reports he would like to d/c home today. He states he has family available to assist him 24 hours. Pt is currently slow to progress with mobility. He requires min assist with bed mobility, min assist to stand from elevated surfaces, min guard to ambulate only 26' with RW. Pt has significant quad weakness and fatigues quickly. There is no family present to discuss mobility needs and safety.  I recommend another day of therapy prior to d/c home. Recommend HHPT.  Follow Up Recommendations  Home health PT;Supervision/Assistance - 24 hour     Equipment Recommendations  3in1 (PT)    Recommendations for Other Services       Precautions / Restrictions Precautions Precautions: Fall;Knee Required Braces or Orthoses: Knee Immobilizer - Right Restrictions Weight Bearing Restrictions: Yes RLE Weight Bearing: Weight bearing as tolerated    Mobility  Bed Mobility Overal bed mobility: Needs Assistance Bed Mobility: Supine to Sit     Supine to sit: Min assist Sit to supine: Min assist   General bed mobility comments: assistance lifting R LE  Transfers Overall transfer level: Needs assistance Equipment used: Rolling walker (2 wheeled) Transfers: Risk manager;Sit to/from Stand Sit to Stand: Min assist;From elevated surface         General transfer comment: cues for hand placement, increased time  Ambulation/Gait Ambulation/Gait assistance: Min guard Ambulation Distance (Feet): 35 Feet Assistive device: Rolling walker (2 wheeled) Gait Pattern/deviations: Step-to pattern;Decreased step length - left;Decreased stance time - right   Gait velocity interpretation: Below normal speed for age/gender General Gait Details: very slow, cues for  upright posture, cues for safety with RW and sequencing   Stairs Stairs:  (pt declined practice)          Wheelchair Mobility    Modified Rankin (Stroke Patients Only)       Balance Overall balance assessment: Needs assistance Sitting-balance support: No upper extremity supported;Feet supported Sitting balance-Leahy Scale: Good     Standing balance support: Bilateral upper extremity supported Standing balance-Leahy Scale: Fair                      Cognition Arousal/Alertness: Awake/alert Behavior During Therapy: WFL for tasks assessed/performed Overall Cognitive Status: Within Functional Limits for tasks assessed                      Exercises Total Joint Exercises Ankle Circles/Pumps: AROM;Strengthening;Both;10 reps;Supine Quad Sets: AROM;Strengthening;Both;10 reps;Supine Hip ABduction/ADduction: AAROM;Strengthening;Right;10 reps;Supine Straight Leg Raises: AAROM;Strengthening;Right;10 reps;Supine Long Arc Quad: AAROM;Strengthening;Right;10 reps;Supine Knee Flexion: AAROM;Strengthening;Right;10 reps;Seated Goniometric ROM: 70*    General Comments        Pertinent Vitals/Pain Pain Assessment: Faces Pain Score: 3  Pain Location: R leg Pain Descriptors / Indicators: Aching Pain Intervention(s): Limited activity within patient's tolerance;Monitored during session    Home Living                      Prior Function            PT Goals (current goals can now be found in the care plan section) Acute Rehab PT Goals Patient Stated Goal: to go home Progress towards PT goals: Progressing toward goals    Frequency  7X/week  PT Plan Current plan remains appropriate    Co-evaluation             End of Session Equipment Utilized During Treatment: Gait belt;Right knee immobilizer Activity Tolerance: Patient tolerated treatment well Patient left: in bed;with call bell/phone within reach     Time: 1003-1036 PT Time  Calculation (min) (ACUTE ONLY): 33 min  Charges:  $Gait Training: 8-22 mins $Therapeutic Exercise: 8-22 mins                    G Codes:      Lelon Mast 02/24/2015, 10:44 AM

## 2015-02-24 NOTE — Progress Notes (Signed)
Patient demonstrated proper technique for self administering the lovenox injections.  Patient discharged to home accompanied by his daughter.

## 2015-04-04 ENCOUNTER — Telehealth: Payer: Self-pay | Admitting: *Deleted

## 2015-04-04 MED ORDER — OXYCODONE-ACETAMINOPHEN 5-325 MG PO TABS: 1.0000 | ORAL_TABLET | ORAL | Status: DC | PRN

## 2015-04-04 NOTE — Telephone Encounter (Signed)
Notified pt rx ready for pick-up.../lmb 

## 2015-04-04 NOTE — Telephone Encounter (Signed)
Left msg on triage requesting refill on his pain med (Oxycodone).../lmb 

## 2015-04-04 NOTE — Telephone Encounter (Signed)
done

## 2015-05-30 ENCOUNTER — Telehealth: Payer: Self-pay | Admitting: Internal Medicine

## 2015-05-30 ENCOUNTER — Inpatient Hospital Stay (HOSPITAL_COMMUNITY)
Admission: EM | Admit: 2015-05-30 | Discharge: 2015-06-02 | DRG: 378 | Disposition: A | Discharge status: Home / Self Care | Payer: Medicare Other | Attending: Internal Medicine | Admitting: Internal Medicine

## 2015-05-30 ENCOUNTER — Encounter (HOSPITAL_COMMUNITY): Payer: Self-pay | Admitting: Emergency Medicine

## 2015-05-30 DIAGNOSIS — D72829 Elevated white blood cell count, unspecified: Secondary | ICD-10-CM | POA: Diagnosis present

## 2015-05-30 DIAGNOSIS — K921 Melena: Secondary | ICD-10-CM | POA: Diagnosis not present

## 2015-05-30 DIAGNOSIS — G8929 Other chronic pain: Secondary | ICD-10-CM | POA: Diagnosis present

## 2015-05-30 DIAGNOSIS — J449 Chronic obstructive pulmonary disease, unspecified: Secondary | ICD-10-CM | POA: Diagnosis present

## 2015-05-30 DIAGNOSIS — N400 Enlarged prostate without lower urinary tract symptoms: Secondary | ICD-10-CM | POA: Diagnosis present

## 2015-05-30 DIAGNOSIS — R079 Chest pain, unspecified: Secondary | ICD-10-CM | POA: Diagnosis not present

## 2015-05-30 DIAGNOSIS — M545 Low back pain: Secondary | ICD-10-CM | POA: Diagnosis present

## 2015-05-30 DIAGNOSIS — E785 Hyperlipidemia, unspecified: Secondary | ICD-10-CM | POA: Diagnosis present

## 2015-05-30 DIAGNOSIS — I10 Essential (primary) hypertension: Secondary | ICD-10-CM | POA: Diagnosis present

## 2015-05-30 DIAGNOSIS — K209 Esophagitis, unspecified without bleeding: Secondary | ICD-10-CM | POA: Diagnosis present

## 2015-05-30 DIAGNOSIS — K264 Chronic or unspecified duodenal ulcer with hemorrhage: Principal | ICD-10-CM | POA: Diagnosis present

## 2015-05-30 DIAGNOSIS — T39015A Adverse effect of aspirin, initial encounter: Secondary | ICD-10-CM | POA: Diagnosis present

## 2015-05-30 DIAGNOSIS — R072 Precordial pain: Secondary | ICD-10-CM | POA: Diagnosis present

## 2015-05-30 DIAGNOSIS — F17210 Nicotine dependence, cigarettes, uncomplicated: Secondary | ICD-10-CM | POA: Diagnosis present

## 2015-05-30 DIAGNOSIS — Z96653 Presence of artificial knee joint, bilateral: Secondary | ICD-10-CM | POA: Diagnosis present

## 2015-05-30 DIAGNOSIS — M1990 Unspecified osteoarthritis, unspecified site: Secondary | ICD-10-CM | POA: Diagnosis present

## 2015-05-30 DIAGNOSIS — Z79899 Other long term (current) drug therapy: Secondary | ICD-10-CM

## 2015-05-30 DIAGNOSIS — K922 Gastrointestinal hemorrhage, unspecified: Secondary | ICD-10-CM | POA: Diagnosis present

## 2015-05-30 DIAGNOSIS — D62 Acute posthemorrhagic anemia: Secondary | ICD-10-CM | POA: Diagnosis present

## 2015-05-30 DIAGNOSIS — K449 Diaphragmatic hernia without obstruction or gangrene: Secondary | ICD-10-CM

## 2015-05-30 DIAGNOSIS — Z7982 Long term (current) use of aspirin: Secondary | ICD-10-CM

## 2015-05-30 DIAGNOSIS — K222 Esophageal obstruction: Secondary | ICD-10-CM | POA: Diagnosis present

## 2015-05-30 DIAGNOSIS — Z886 Allergy status to analgesic agent status: Secondary | ICD-10-CM

## 2015-05-30 DIAGNOSIS — Z22322 Carrier or suspected carrier of Methicillin resistant Staphylococcus aureus: Secondary | ICD-10-CM

## 2015-05-30 HISTORY — DX: Chronic or unspecified duodenal ulcer with hemorrhage: K26.4

## 2015-05-30 HISTORY — DX: Esophagitis, unspecified: K20.9

## 2015-05-30 HISTORY — DX: Esophageal obstruction: K22.2

## 2015-05-30 HISTORY — DX: Diaphragmatic hernia without obstruction or gangrene: K44.9

## 2015-05-30 HISTORY — DX: Essential (primary) hypertension: I10

## 2015-05-30 LAB — BASIC METABOLIC PANEL
Anion gap: 10 (ref 5–15)
BUN: 28 mg/dL — ABNORMAL HIGH (ref 6–20)
CO2: 26 mmol/L (ref 22–32)
Calcium: 10.1 mg/dL (ref 8.9–10.3)
Chloride: 109 mmol/L (ref 101–111)
Creatinine, Ser: 1.04 mg/dL (ref 0.61–1.24)
GFR calc Af Amer: >60 mL/min (ref >60)
GFR calc non Af Amer: >60 mL/min (ref >60)
Glucose, Bld: 122 mg/dL — ABNORMAL HIGH (ref 65–99)
Potassium: 4.4 mmol/L (ref 3.5–5.1)
Sodium: 145 mmol/L (ref 135–145)

## 2015-05-30 LAB — CBC
HCT: 32.7 % — ABNORMAL LOW (ref 39.0–52.0)
Hemoglobin: 10.7 g/dL — ABNORMAL LOW (ref 13.0–17.0)
MCH: 30 pg (ref 26.0–34.0)
MCHC: 32.7 g/dL (ref 30.0–36.0)
MCV: 91.6 fL (ref 78.0–100.0)
Platelets: 352 10*3/uL (ref 150–400)
RBC: 3.57 MIL/uL — ABNORMAL LOW (ref 4.22–5.81)
RDW: 14.9 % (ref 11.5–15.5)
WBC: 12.8 10*3/uL — ABNORMAL HIGH (ref 4.0–10.5)

## 2015-05-30 LAB — URINALYSIS, ROUTINE W REFLEX MICROSCOPIC
Bilirubin Urine: NEGATIVE
Glucose, UA: NEGATIVE mg/dL
Hgb urine dipstick: NEGATIVE
Ketones, ur: NEGATIVE mg/dL
Leukocytes, UA: NEGATIVE
Nitrite: NEGATIVE
Protein, ur: NEGATIVE mg/dL
Specific Gravity, Urine: 1.026 (ref 1.005–1.030)
pH: 5 (ref 5.0–8.0)

## 2015-05-30 LAB — TROPONIN I: Troponin I: <0.03 ng/mL

## 2015-05-30 LAB — POC OCCULT BLOOD, ED: Fecal Occult Bld: POSITIVE — AB

## 2015-05-30 MED ORDER — ACETAMINOPHEN 500 MG PO TABS
1000.0000 mg | ORAL_TABLET | Freq: Three times a day (TID) | ORAL | Status: DC | PRN
  Administered 2015-06-01 (×2): 1000 mg via ORAL
  Filled 2015-05-30 (×2): qty 2

## 2015-05-30 MED ORDER — AMLODIPINE BESYLATE 5 MG PO TABS
5.0000 mg | ORAL_TABLET | Freq: Every day | ORAL | Status: DC
  Administered 2015-05-31 – 2015-06-02 (×3): 5 mg via ORAL
  Filled 2015-05-30 (×3): qty 1

## 2015-05-30 MED ORDER — PANTOPRAZOLE SODIUM 40 MG IV SOLR
40.0000 mg | Freq: Two times a day (BID) | INTRAVENOUS | Status: DC
  Administered 2015-05-31 – 2015-06-01 (×5): 40 mg via INTRAVENOUS
  Filled 2015-05-30 (×6): qty 40

## 2015-05-30 MED ORDER — ACETAMINOPHEN 325 MG PO TABS
650.0000 mg | ORAL_TABLET | ORAL | Status: DC | PRN
  Administered 2015-05-31: 650 mg via ORAL
  Filled 2015-05-30: qty 2

## 2015-05-30 MED ORDER — ONDANSETRON HCL 4 MG/2ML IJ SOLN: 4.0000 mg | Freq: Four times a day (QID) | INTRAMUSCULAR | Status: DC | PRN

## 2015-05-30 NOTE — ED Notes (Signed)
Nurse drawing labs. 

## 2015-05-30 NOTE — ED Notes (Signed)
Pt c/o dizziness x 3 days. Pt denies N/V/D, abdominal pain, urinary symptoms. Pt c/o decrease in appetite. A&Ox4. Pt c/o SOB "every now and then"

## 2015-05-30 NOTE — ED Provider Notes (Signed)
CSN: [id number]     Arrival date & time 05/30/15  1537 History   First MD Initiated Contact with Patient 05/30/15 2132     Chief Complaint  Patient presents with  . Dizziness     (Consider location/radiation/quality/duration/timing/severity/associated sxs/prior Treatment) HPI  Patient reports he had burning in his chest while at rest yesterday morning and this morning lasting 1 or 2 hours. He also complains of generalized weakness. He is presently asymptomatic. No treatment prior to coming here. Nothing makes symptoms better or worse. Denies shortness of breath. No nausea or vomiting. He does admit that he sometimes gets chest pain while walking, which improves with rest. Past Medical History  Diagnosis Date  . Arthritis   . Hyperlipidemia     takes Zetia and Atorvastatin daily  . BPH (benign prostatic hypertrophy)   . Hypertension     takes Amlodipine and Benicar daily  . Joint swelling   . Chronic back pain   . Urinary frequency   . Urinary urgency   . COPD (chronic obstructive pulmonary disease) ([place])     pt denies this diagnosis   Past Surgical History  Procedure Laterality Date  . Left rotator cuff repair  2002  . Hernia repair  1975  . Replacement total knee Left   . Total knee arthroplasty Right 02/22/2015    Procedure: TOTAL KNEE ARTHROPLASTY;  Surgeon: Melrose Nakayama, MD;  [facility];  Service: Orthopedics;  Laterality: Right;   No family history on file. Social History  Substance Use Topics  . Smoking status: Current Every Day Smoker -- 0.50 packs/day for 51 years    Types: Cigarettes  . Smokeless tobacco: Former Systems developer    Quit date: 11/03/2014     Comment: quit 2 weeks ago  . Alcohol Use: No    Review of Systems  HENT: Negative.   Respiratory: Negative.   Cardiovascular: Positive for chest pain.  Gastrointestinal: Positive for blood in stool.       Noted red blood in stool past 2 days  Musculoskeletal: Negative.   Skin: Negative.   Neurological:  Positive for weakness.  Psychiatric/Behavioral: Negative.   All other systems reviewed and are negative.     Allergies  Asa  Home Medications   Prior to Admission medications   Medication Sig Start Date End Date Taking? Authorizing Provider  acetaminophen (TYLENOL) 500 MG tablet Take 1,000 mg by mouth every 8 (eight) hours as needed for mild pain or moderate pain.   Yes Historical Provider, MD  amLODipine (NORVASC) 5 MG tablet Take 1 tablet (5 mg total) by mouth daily. 10/13/14  Yes Janith Lima, MD  Aspirin-Salicylamide-Caffeine (BC HEADACHE POWDER PO) Take 1 each by mouth daily.   Yes Historical Provider, MD  enoxaparin (LOVENOX) 40 MG/0.4ML injection Inject 0.4 mLs (40 mg total) into the skin daily. Patient not taking: Reported on 05/30/2015 02/24/15   Loni Dolly, PA-C  methocarbamol (ROBAXIN) 500 MG tablet Take 1 tablet (500 mg total) by mouth every 6 (six) hours as needed for muscle spasms. Patient not taking: Reported on 05/30/2015 02/24/15   Loni Dolly, PA-C  oxyCODONE-acetaminophen (ROXICET) 5-325 MG tablet Take 1-2 tablets by mouth every 4 (four) hours as needed for severe pain. Patient not taking: Reported on 05/30/2015 04/04/15   Janith Lima, MD   BP 148/75 mmHg  Pulse 97  Temp(Src) 98.3 F (36.8 C) (Oral)  Resp 16  SpO2 100% Physical Exam  Constitutional: He appears well-developed and well-nourished. No distress.  HENT:  Head: Normocephalic and atraumatic.  Eyes: Conjunctivae are normal. Pupils are equal, round, and reactive to light.  Neck: Neck supple. No tracheal deviation present. No thyromegaly present.  Cardiovascular: Normal rate and regular rhythm.   No murmur heard. Pulmonary/Chest: Effort normal and breath sounds normal.  Abdominal: Soft. Bowel sounds are normal. He exhibits no distension. There is no tenderness.  Genitourinary: Guaiac positive stool.  Normal tone and brown stool Hemoccult positive  Musculoskeletal: Normal range of motion. He exhibits  no edema or tenderness.  Neurological: He is alert. Coordination normal.  Skin: Skin is warm and dry. No rash noted.  Psychiatric: He has a normal mood and affect.  Nursing note and vitals reviewed.   ED Course  Procedures (including critical care time) Labs Review Labs Reviewed  BASIC METABOLIC PANEL - Abnormal; Notable for the following:    Glucose, Bld 122 (*)    BUN 28 (*)    All other components within normal limits  CBC - Abnormal; Notable for the following:    WBC 12.8 (*)    RBC 3.57 (*)    Hemoglobin 10.7 (*)    HCT 32.7 (*)    All other components within normal limits  URINALYSIS, ROUTINE W REFLEX MICROSCOPIC (NOT AT [facility])    Imaging Review No results found. I have personally reviewed and evaluated these images and lab results as part of my medical decision-making.   EKG Interpretation   Date/Time:  Monday May 30 2015 16:11:54 EST Ventricular Rate:  92 PR Interval:  146 QRS Duration: 87 QT Interval:  310 QTC Calculation: 383 R Axis:   57 Text Interpretation:  Sinus rhythm Borderline repolarization abnormality  SINCE LAST TRACING HEART RATE HAS INCREASED Confirmed by Winfred Leeds  MD,  Meriel Kelliher [phone]) on 05/30/2015 4:15:38 PM      MDM  Patient's story concerning for angina. He also has anemia which is new in onset over 3 months ago, with Hemoccult-positive stools. Heart score was 5. ASA withheld as pt with hemoccult positive stools alnd has asa allergy I consulted with Dr. Alcario Drought hospitalist service. Plan telemetry, 23 hour observation. Diagnoses #1 chest pain #2 weakness #3 Hemoccult-positive stools #4 anemia #5 tobacco abuse Final diagnoses:  None        Orlie Dakin, MD 05/30/15 2212

## 2015-05-30 NOTE — Telephone Encounter (Signed)
[facility] [place] [facility]  Patient Name: Adrian Miller  DOB: [birth year]    Initial Comment Caller states her Dad passed out, called 911. EMS came and he didn't want to be transported. He's still weak and very dizzy. Doesn't want to eat.   Nurse Assessment  Nurse: Wayne Sever, RN, Tillie Rung Date/Time (Eastern Time): 05/30/2015 2:44:13 PM  Confirm and document reason for call. If symptomatic, describe symptoms. You must click the next button to save text entered. ---Caller states her father passed out at 4am Sunday morning. He had dialed 911, but refused transport. Daughter says he is sitting up and doing okay. This was 2 days ago. He is feeling okay since eating when daughter arrived at house today.  Has the patient traveled out of the country within the last 30 days? ---Not Applicable  Does the patient have any new or worsening symptoms? ---Yes  Will a triage be completed? ---Yes  Related visit to physician within the last 2 weeks? ---No  Does the PT have any chronic conditions? (i.e. diabetes, asthma, etc.) ---Yes  List chronic conditions. ---HTN  Is this a behavioral health or substance abuse call? ---No     Guidelines    Guideline Title Affirmed Question Affirmed Notes  Fainting [1] Age > 50 years AND [2] now alert and feels fine    Final Disposition User   Go to ED Now (or PCP triage) Wayne Sever, RN, Tillie Rung    Comments  Daughter agrees to take to ER, no appointments left for the day in office   Referrals  [facility] - ED   Disagree/Comply: Comply

## 2015-05-30 NOTE — H&P (Signed)
[facility] History and Physical  NILTON FISCHETTI [id number] DOB: 16-Oct-1940 DOA: 05/30/2015  Referring physician: EDP PCP: Scarlette Calico, MD   Chief Complaint: Chest pain, melena   HPI: Adrian Miller is a 75 y.o. male who presents to the ED with c/o chest burning like sensation.  Symptoms onset at rest yesterday morning and re-occurred this morning.  Symptoms lasted 1-2 hours.  There is associated generalized weakness, SOB.  Patient reports that last week he had significant amounts of black-tarry stool.  This has improved this week, but he has developed the chest pain symptoms as above.  Review of Systems: Systems reviewed.  As above, otherwise negative  Past Medical History  Diagnosis Date  . Arthritis   . Hyperlipidemia     takes Zetia and Atorvastatin daily  . BPH (benign prostatic hypertrophy)   . Hypertension     takes Amlodipine and Benicar daily  . Joint swelling   . Chronic back pain   . Urinary frequency   . Urinary urgency   . COPD (chronic obstructive pulmonary disease) ([place])     pt denies this diagnosis   Past Surgical History  Procedure Laterality Date  . Left rotator cuff repair  2002  . Hernia repair  1975  . Replacement total knee Left   . Total knee arthroplasty Right 02/22/2015    Procedure: TOTAL KNEE ARTHROPLASTY;  Surgeon: Melrose Nakayama, MD;  Location: [place];  Service: Orthopedics;  Laterality: Right;   Social History:  reports that he has been smoking Cigarettes.  He has a 25.5 pack-year smoking history. He quit smokeless tobacco use about 6 months ago. He reports that he does not drink alcohol or use illicit drugs.  Allergies  Allergen Reactions  . Asa [Aspirin]     angioedema    No family history on file.   Prior to Admission medications   Medication Sig Start Date End Date Taking? Authorizing Provider  acetaminophen (TYLENOL) 500 MG tablet Take 1,000 mg by mouth every 8 (eight) hours as needed for mild pain or moderate pain.   Yes  Historical Provider, MD  amLODipine (NORVASC) 5 MG tablet Take 1 tablet (5 mg total) by mouth daily. 10/13/14  Yes Janith Lima, MD  Aspirin-Salicylamide-Caffeine (BC HEADACHE POWDER PO) Take 1 each by mouth daily.   Yes Historical Provider, MD   Physical Exam: Filed Vitals:   05/30/15 2245 05/30/15 2251  BP: 152/107 152/107  Pulse:  97  Temp:    Resp: 16 17    BP 152/107 mmHg  Pulse 97  Temp(Src) 98.3 F (36.8 C) (Oral)  Resp 17  SpO2 100%  General Appearance:    Alert, oriented, no distress, appears stated age  Head:    Normocephalic, atraumatic  Eyes:    PERRL, EOMI, sclera non-icteric        Nose:   Nares without drainage or epistaxis. Mucosa, turbinates normal  Throat:   Moist mucous membranes. Oropharynx without erythema or exudate.  Neck:   Supple. No carotid bruits.  No thyromegaly.  No lymphadenopathy.   Back:     No CVA tenderness, no spinal tenderness  Lungs:     Clear to auscultation bilaterally, without wheezes, rhonchi or rales  Chest wall:    No tenderness to palpitation  Heart:    Regular rate and rhythm without murmurs, gallops, rubs  Abdomen:     Soft, non-tender, nondistended, normal bowel sounds, no organomegaly  Genitalia:    deferred  Rectal:  deferred  Extremities:   No clubbing, cyanosis or edema.  Pulses:   2+ and symmetric all extremities  Skin:   Skin color, texture, turgor normal, no rashes or lesions  Lymph nodes:   Cervical, supraclavicular, and axillary nodes normal  Neurologic:   CNII-XII intact. Normal strength, sensation and reflexes      throughout    Labs on Admission:  Basic Metabolic Panel:  Recent Labs Lab 05/30/15 1631  NA 145  K 4.4  CL 109  CO2 26  GLUCOSE 122*  BUN 28*  CREATININE 1.04  CALCIUM 10.1   Liver Function Tests: No results for input(s): AST, ALT, ALKPHOS, BILITOT, PROT, ALBUMIN in the last 168 hours. No results for input(s): LIPASE, AMYLASE in the last 168 hours. No results for input(s): AMMONIA in the  last 168 hours. CBC:  Recent Labs Lab 05/30/15 1631  WBC 12.8*  HGB 10.7*  HCT 32.7*  MCV 91.6  PLT 352   Cardiac Enzymes: No results for input(s): CKTOTAL, CKMB, CKMBINDEX, TROPONINI in the last 168 hours.  BNP (last 3 results) No results for input(s): PROBNP in the last 8760 hours. CBG: No results for input(s): GLUCAP in the last 168 hours.  Radiological Exams on Admission: No results found.  EKG: Independently reviewed.  Assessment/Plan Principal Problem:   Melena Active Problems:   Chest pain at rest   1. Melena - suspicious for NSAID induced ulcer, patient takes daily BC goody's powder 1. Needs GI consult in AM 2. HGB drop from 15.3 in October to 10.7 now is noted. 1. Repeat CBC in AM 3. PPI IV 4. In addition to upper endoscopy for above, patient also has never had colonoscopy he states, this latter likely can be done as outpatient 2. Chest pain - HEART score of at least 4, possibly 5 depending on the subjective symptoms. 1. Chest pain obs pathway 2. Tele monitor 3. Call cards in AM 4. Patient actually had stress test ordered back in 2013 which he never ended up getting done 5. Last heart cath was in the 90s 6. Didn't give ASA due to issue #1 above, is listed as allergy, but he takes [facility] goody's powder (which has ASA in it) daily. 3. HTN - continue home meds 4. HLD - looks like he hasnt been taking statin    Code Status: Full Code  Family Communication: Family at bedside Disposition Plan: Admit to obs   Time spent: 70 min  Maleki Hippe M. [facility] Pager [phone]  If 7AM-7PM, please contact the day team taking care of the patient [email or web address] Password [facility] 05/30/2015, 10:55 PM

## 2015-05-31 ENCOUNTER — Encounter (HOSPITAL_COMMUNITY)
Admission: EM | Disposition: A | Discharge status: Home / Self Care | Payer: Self-pay | Source: Home / Self Care | Attending: Internal Medicine

## 2015-05-31 ENCOUNTER — Encounter (HOSPITAL_COMMUNITY): Payer: Self-pay | Admitting: Internal Medicine

## 2015-05-31 DIAGNOSIS — I10 Essential (primary) hypertension: Secondary | ICD-10-CM

## 2015-05-31 DIAGNOSIS — D62 Acute posthemorrhagic anemia: Secondary | ICD-10-CM | POA: Diagnosis present

## 2015-05-31 DIAGNOSIS — R079 Chest pain, unspecified: Secondary | ICD-10-CM | POA: Diagnosis not present

## 2015-05-31 DIAGNOSIS — K922 Gastrointestinal hemorrhage, unspecified: Secondary | ICD-10-CM | POA: Diagnosis present

## 2015-05-31 DIAGNOSIS — D72829 Elevated white blood cell count, unspecified: Secondary | ICD-10-CM | POA: Diagnosis not present

## 2015-05-31 DIAGNOSIS — K264 Chronic or unspecified duodenal ulcer with hemorrhage: Secondary | ICD-10-CM | POA: Diagnosis not present

## 2015-05-31 DIAGNOSIS — K921 Melena: Secondary | ICD-10-CM | POA: Diagnosis not present

## 2015-05-31 HISTORY — DX: Essential (primary) hypertension: I10

## 2015-05-31 HISTORY — PX: ESOPHAGOGASTRODUODENOSCOPY: SHX5428

## 2015-05-31 LAB — MRSA PCR SCREENING: MRSA by PCR: POSITIVE — AB

## 2015-05-31 LAB — CBC
HCT: 29.3 % — ABNORMAL LOW (ref 39.0–52.0)
Hemoglobin: 9.7 g/dL — ABNORMAL LOW (ref 13.0–17.0)
MCH: 31 pg (ref 26.0–34.0)
MCHC: 33.1 g/dL (ref 30.0–36.0)
MCV: 93.6 fL (ref 78.0–100.0)
Platelets: 337 10*3/uL (ref 150–400)
RBC: 3.13 MIL/uL — ABNORMAL LOW (ref 4.22–5.81)
RDW: 15.1 % (ref 11.5–15.5)
WBC: 13.1 10*3/uL — ABNORMAL HIGH (ref 4.0–10.5)

## 2015-05-31 LAB — TROPONIN I
Troponin I: <0.03 ng/mL (ref <0.031)
Troponin I: <0.03 ng/mL (ref <0.031)

## 2015-05-31 SURGERY — EGD (ESOPHAGOGASTRODUODENOSCOPY)
Anesthesia: Moderate Sedation

## 2015-05-31 MED ORDER — CHLORHEXIDINE GLUCONATE CLOTH 2 % EX PADS
6.0000 | MEDICATED_PAD | Freq: Every day | CUTANEOUS | Status: DC
  Administered 2015-06-01 – 2015-06-02 (×2): 6 via TOPICAL

## 2015-05-31 MED ORDER — MIDAZOLAM HCL 10 MG/2ML IJ SOLN
INTRAMUSCULAR | Status: DC | PRN
  Administered 2015-05-31: 1 mg via INTRAVENOUS
  Administered 2015-05-31 (×2): 2 mg via INTRAVENOUS

## 2015-05-31 MED ORDER — TRAMADOL HCL 50 MG PO TABS
50.0000 mg | ORAL_TABLET | Freq: Once | ORAL | Status: AC
  Administered 2015-05-31: 50 mg via ORAL
  Filled 2015-05-31: qty 1

## 2015-05-31 MED ORDER — MIDAZOLAM HCL 5 MG/ML IJ SOLN
INTRAMUSCULAR | Status: AC
  Filled 2015-05-31: qty 2

## 2015-05-31 MED ORDER — MUPIROCIN 2 % EX OINT
1.0000 | TOPICAL_OINTMENT | Freq: Two times a day (BID) | CUTANEOUS | Status: DC
Start: 2015-05-31 — End: 2015-06-02
  Administered 2015-05-31 – 2015-06-01 (×3): 1 via NASAL
  Filled 2015-05-31: qty 22

## 2015-05-31 MED ORDER — FENTANYL CITRATE (PF) 100 MCG/2ML IJ SOLN
INTRAMUSCULAR | Status: AC
  Filled 2015-05-31: qty 2

## 2015-05-31 MED ORDER — BUTAMBEN-TETRACAINE-BENZOCAINE 2-2-14 % EX AERO
INHALATION_SPRAY | CUTANEOUS | Status: DC | PRN
  Administered 2015-05-31: 2 via TOPICAL

## 2015-05-31 MED ORDER — SODIUM CHLORIDE 0.9 % IV SOLN: INTRAVENOUS | Status: DC

## 2015-05-31 MED ORDER — SODIUM CHLORIDE 0.9 % IV SOLN
INTRAVENOUS | Status: DC
  Administered 2015-05-31: 19:00:00 via INTRAVENOUS

## 2015-05-31 MED ORDER — FENTANYL CITRATE (PF) 100 MCG/2ML IJ SOLN
INTRAMUSCULAR | Status: DC | PRN
  Administered 2015-05-31 (×2): 25 ug via INTRAVENOUS

## 2015-05-31 MED ORDER — NICOTINE 7 MG/24HR TD PT24
7.0000 mg | MEDICATED_PATCH | Freq: Every day | TRANSDERMAL | Status: DC
  Administered 2015-05-31 – 2015-06-02 (×3): 7 mg via TRANSDERMAL
  Filled 2015-05-31 (×3): qty 1

## 2015-05-31 NOTE — ED Notes (Signed)
Bed: [id number] Expected date:  Expected time:  Means of arrival:  Comments: Pt in endo

## 2015-05-31 NOTE — Consult Note (Signed)
Referring Provider: Dr. Charlies Silvers Primary Care Physician:  Scarlette Calico, MD Primary Gastroenterologist:  Althia Forts  Reason for Consultation:  Melena; heme positive stool; NSAID use  HPI: Adrian Miller is a 75 y.o. male who has limited PMH, but has undergone orthopedic surgeries.  He presented to [facility] hospital due to "almost passing out" and complaints of weakness.  Also complains of several episodes of black stools, the last was on 1/23.  Denies any abdominal pain or nausea/vomiting.  Has been using one Goody or BC powder daily for several years.  Heme positive in the ED.  Hgb was 10.7 grams yesterday and is 9.7 grams today.  His baseline is around 15 and he was at that level when last checked about 3 months ago.  BUN up some at 28.  They have him on IV PPI BID here.  He's never had any GI evaluation in the past.  History reviewed. No pertinent past medical history.  Past Surgical History  Procedure Laterality Date  . Left rotator cuff repair  2002  . Hernia repair  1975  . Replacement total knee Left   . Total knee arthroplasty Right 02/22/2015    Procedure: TOTAL KNEE ARTHROPLASTY;  Surgeon: Melrose Nakayama, MD;  Location: [place];  Service: Orthopedics;  Laterality: Right;    Prior to Admission medications   Medication Sig Start Date End Date Taking? Authorizing Provider  acetaminophen (TYLENOL) 500 MG tablet Take 1,000 mg by mouth every 8 (eight) hours as needed for mild pain or moderate pain.   Yes Historical Provider, MD  amLODipine (NORVASC) 5 MG tablet Take 1 tablet (5 mg total) by mouth daily. 10/13/14  Yes Janith Lima, MD  Aspirin-Salicylamide-Caffeine (BC HEADACHE POWDER PO) Take 1 each by mouth daily.   Yes Historical Provider, MD    Current Facility-Administered Medications  Medication Dose Route Frequency Provider Last Rate Last Dose  . acetaminophen (TYLENOL) tablet 1,000 mg  1,000 mg Oral Q8H PRN Etta Quill, DO      . acetaminophen (TYLENOL) tablet 650 mg  650 mg Oral  Q4H PRN Etta Quill, DO   650 mg at 05/31/15 0030  . amLODipine (NORVASC) tablet 5 mg  5 mg Oral Daily Etta Quill, DO   5 mg at 05/31/15 1048  . nicotine (NICODERM CQ - dosed in mg/24 hr) patch 7 mg  7 mg Transdermal Q0600 Gardiner Barefoot, NP   7 mg at 05/31/15 0521  . ondansetron (ZOFRAN) injection 4 mg  4 mg Intravenous Q6H PRN Etta Quill, DO      . pantoprazole (PROTONIX) injection 40 mg  40 mg Intravenous Q12H Etta Quill, DO   40 mg at 05/31/15 1049   Current Outpatient Prescriptions  Medication Sig Dispense Refill  . acetaminophen (TYLENOL) 500 MG tablet Take 1,000 mg by mouth every 8 (eight) hours as needed for mild pain or moderate pain.    [name] amLODipine (NORVASC) 5 MG tablet Take 1 tablet (5 mg total) by mouth daily. 90 tablet 3  . Aspirin-Salicylamide-Caffeine (BC HEADACHE POWDER PO) Take 1 each by mouth daily.      Allergies as of 05/30/2015 - Review Complete 05/30/2015  Allergen Reaction Noted  . Asa [aspirin]  03/06/2013    No family history on file.  Social History   Social History  . Marital Status: Single    Spouse Name: N/A  . Number of Children: N/A  . Years of Education: N/A   Occupational History  .  Not on file.   Social History Main Topics  . Smoking status: Current Every Day Smoker -- 0.50 packs/day for 51 years    Types: Cigarettes  . Smokeless tobacco: Former Systems developer    Quit date: 11/03/2014     Comment: quit 2 weeks ago  . Alcohol Use: No  . Drug Use: No  . Sexual Activity: No   Other Topics Concern  . Not on file   Social History Narrative    Review of Systems: Ten point ROS is O/W negative except as mentioned in HPI.  Physical Exam: Vital signs in last 24 hours: Temp:  [98.3 F (36.8 C)] 98.3 F (36.8 C) (01/23 1603) Pulse Rate:  [77-97] 77 (01/24 1055) Resp:  [12-21] 12 (01/24 1055) BP: (110-152)/(54-124) 134/73 mmHg (01/24 1055) SpO2:  [96 %-100 %] 100 % (01/24 1055)   General:  Alert, Well-developed,  well-nourished, pleasant and cooperative in NAD Head:  Normocephalic and atraumatic. Eyes:  Sclera clear, no icterus.  Conjunctiva pink. Ears:  Normal auditory acuity. Mouth:  No deformity or lesions.   Lungs:  Clear throughout to auscultation.  No wheezes, crackles, or rhonchi.  Heart:  Regular rate and rhythm; no murmurs, clicks, rubs, or gallops. Abdomen:  Soft, non-distended.  BS present.  Non-tender. Rectal:  Deferred  Msk:  Symmetrical without gross deformities. Pulses:  Normal pulses noted. Extremities:  Without clubbing or edema. Neurologic:  Alert and oriented x4; grossly normal neurologically. Skin:  Intact without significant lesions or rashes. Psych:  Alert and cooperative. Normal mood and affect.  Lab Results:  Recent Labs  05/30/15 1631 05/31/15 0428  WBC 12.8* 13.1*  HGB 10.7* 9.7*  HCT 32.7* 29.3*  PLT 352 337   BMET  Recent Labs  05/30/15 1631  NA 145  K 4.4  CL 109  CO2 26  GLUCOSE 122*  BUN 28*  CREATININE 1.04  CALCIUM 10.1     IMPRESSION:  -75 year old male with complaints of melena in the setting of BC/Goody powder use.  Hgb also down 6 grams from baseline (last checked 3 months ago).  PLAN: -Plan for EGD today to rule out ulcer disease, which I suspect, vs other etiologies. -Continue IV BID PPI for now.  [name], [name]  05/31/2015, 1:30 PM  Pager number [phone]     Attending physician's note   I have taken a history, examined the patient and reviewed the chart. I agree with the Advanced Practitioner's note, impression and recommendations. Melena and ABL anemia with heavy BC/Goody powder use. Trend Hb. IV PPI. EGD today.   [name], MD [facility] [phone] Mon-Fri 8a-5p [phone] after 5p, weekends, holidays

## 2015-05-31 NOTE — H&P (View-Only) (Signed)
Referring Provider: Dr. Charlies Silvers Primary Care Physician:  Scarlette Calico, MD Primary Gastroenterologist:  Althia Forts  Reason for Consultation:  Melena; heme positive stool; NSAID use  HPI: Adrian Miller is a 75 y.o. male who has limited PMH, but has undergone orthopedic surgeries.  He presented to [facility] hospital due to "almost passing out" and complaints of weakness.  Also complains of several episodes of black stools, the last was on 1/23.  Denies any abdominal pain or nausea/vomiting.  Has been using one Goody or BC powder daily for several years.  Heme positive in the ED.  Hgb was 10.7 grams yesterday and is 9.7 grams today.  His baseline is around 15 and he was at that level when last checked about 3 months ago.  BUN up some at 28.  They have him on IV PPI BID here.  He's never had any GI evaluation in the past.  History reviewed. No pertinent past medical history.  Past Surgical History  Procedure Laterality Date  . Left rotator cuff repair  2002  . Hernia repair  1975  . Replacement total knee Left   . Total knee arthroplasty Right 02/22/2015    Procedure: TOTAL KNEE ARTHROPLASTY;  Surgeon: Melrose Nakayama, MD;  Location: [place];  Service: Orthopedics;  Laterality: Right;    Prior to Admission medications   Medication Sig Start Date End Date Taking? Authorizing Provider  acetaminophen (TYLENOL) 500 MG tablet Take 1,000 mg by mouth every 8 (eight) hours as needed for mild pain or moderate pain.   Yes Historical Provider, MD  amLODipine (NORVASC) 5 MG tablet Take 1 tablet (5 mg total) by mouth daily. 10/13/14  Yes Janith Lima, MD  Aspirin-Salicylamide-Caffeine (BC HEADACHE POWDER PO) Take 1 each by mouth daily.   Yes Historical Provider, MD    Current Facility-Administered Medications  Medication Dose Route Frequency Provider Last Rate Last Dose  . acetaminophen (TYLENOL) tablet 1,000 mg  1,000 mg Oral Q8H PRN Etta Quill, DO      . acetaminophen (TYLENOL) tablet 650 mg  650 mg Oral  Q4H PRN Etta Quill, DO   650 mg at 05/31/15 0030  . amLODipine (NORVASC) tablet 5 mg  5 mg Oral Daily Etta Quill, DO   5 mg at 05/31/15 1048  . nicotine (NICODERM CQ - dosed in mg/24 hr) patch 7 mg  7 mg Transdermal Q0600 Gardiner Barefoot, NP   7 mg at 05/31/15 0521  . ondansetron (ZOFRAN) injection 4 mg  4 mg Intravenous Q6H PRN Etta Quill, DO      . pantoprazole (PROTONIX) injection 40 mg  40 mg Intravenous Q12H Etta Quill, DO   40 mg at 05/31/15 1049   Current Outpatient Prescriptions  Medication Sig Dispense Refill  . acetaminophen (TYLENOL) 500 MG tablet Take 1,000 mg by mouth every 8 (eight) hours as needed for mild pain or moderate pain.    [name] amLODipine (NORVASC) 5 MG tablet Take 1 tablet (5 mg total) by mouth daily. 90 tablet 3  . Aspirin-Salicylamide-Caffeine (BC HEADACHE POWDER PO) Take 1 each by mouth daily.      Allergies as of 05/30/2015 - Review Complete 05/30/2015  Allergen Reaction Noted  . Asa [aspirin]  03/06/2013    No family history on file.  Social History   Social History  . Marital Status: Single    Spouse Name: N/A  . Number of Children: N/A  . Years of Education: N/A   Occupational History  .  Not on file.   Social History Main Topics  . Smoking status: Current Every Day Smoker -- 0.50 packs/day for 51 years    Types: Cigarettes  . Smokeless tobacco: Former Systems developer    Quit date: 11/03/2014     Comment: quit 2 weeks ago  . Alcohol Use: No  . Drug Use: No  . Sexual Activity: No   Other Topics Concern  . Not on file   Social History Narrative    Review of Systems: Ten point ROS is O/W negative except as mentioned in HPI.  Physical Exam: Vital signs in last 24 hours: Temp:  [98.3 F (36.8 C)] 98.3 F (36.8 C) (01/23 1603) Pulse Rate:  [77-97] 77 (01/24 1055) Resp:  [12-21] 12 (01/24 1055) BP: (110-152)/(54-124) 134/73 mmHg (01/24 1055) SpO2:  [96 %-100 %] 100 % (01/24 1055)   General:  Alert, Well-developed,  well-nourished, pleasant and cooperative in NAD Head:  Normocephalic and atraumatic. Eyes:  Sclera clear, no icterus.  Conjunctiva pink. Ears:  Normal auditory acuity. Mouth:  No deformity or lesions.   Lungs:  Clear throughout to auscultation.  No wheezes, crackles, or rhonchi.  Heart:  Regular rate and rhythm; no murmurs, clicks, rubs, or gallops. Abdomen:  Soft, non-distended.  BS present.  Non-tender. Rectal:  Deferred  Msk:  Symmetrical without gross deformities. Pulses:  Normal pulses noted. Extremities:  Without clubbing or edema. Neurologic:  Alert and oriented x4; grossly normal neurologically. Skin:  Intact without significant lesions or rashes. Psych:  Alert and cooperative. Normal mood and affect.  Lab Results:  Recent Labs  05/30/15 1631 05/31/15 0428  WBC 12.8* 13.1*  HGB 10.7* 9.7*  HCT 32.7* 29.3*  PLT 352 337   BMET  Recent Labs  05/30/15 1631  NA 145  K 4.4  CL 109  CO2 26  GLUCOSE 122*  BUN 28*  CREATININE 1.04  CALCIUM 10.1     IMPRESSION:  -75 year old male with complaints of melena in the setting of BC/Goody powder use.  Hgb also down 6 grams from baseline (last checked 3 months ago).  PLAN: -Plan for EGD today to rule out ulcer disease, which I suspect, vs other etiologies. -Continue IV BID PPI for now.  [name], [name]  05/31/2015, 1:30 PM  Pager number [phone]     Attending physician's note   I have taken a history, examined the patient and reviewed the chart. I agree with the Advanced Practitioner's note, impression and recommendations. Melena and ABL anemia with heavy BC/Goody powder use. Trend Hb. IV PPI. EGD today.   [name], MD [facility] [phone] Mon-Fri 8a-5p [phone] after 5p, weekends, holidays

## 2015-05-31 NOTE — Interval H&P Note (Signed)
History and Physical Interval Note:  05/31/2015 3:29 PM  Adrian Miller  has presented today for surgery, with the diagnosis of Melena, anemia, NSAID use  The various methods of treatment have been discussed with the patient and family. After consideration of risks, benefits and other options for treatment, the patient has consented to  Procedure(s): ESOPHAGOGASTRODUODENOSCOPY (EGD) (N/A) as a surgical intervention .  The patient's history has been reviewed, patient examined, no change in status, stable for surgery.  I have reviewed the patient's chart and labs.  Questions were answered to the patient's satisfaction.     Pricilla Riffle. Fuller Plan

## 2015-05-31 NOTE — ED Notes (Signed)
Pt to endoscopy.

## 2015-05-31 NOTE — Progress Notes (Signed)
Patient ID: Adrian Miller, male   DOB: 1941-03-16, 75 y.o.   MRN: [id number] [facility] PROGRESS NOTE  [name] [id number] DOB: 1941-03-02 DOA: 05/30/2015 PCP: Scarlette Calico, MD  Brief narrative:    75 y.o. male with past medical history of hypertension, Bell's policy, chronic low back pain for which he was taking Goody powders. He presented to [facility] ED with melenotic stools over past week or so. No abdominal pain. No nausea or vomiting. Patient additional reported having midsternal chest pain which started the morning of the admission and lasted about 1-2 hours, pain was sharp, non radiating, present at rest. No specific alleviating or aggravating factors. Pain has completely resolved at this point.  In ED, patient was hemodynamically stable. Blood work was notable for leukocytosis of 12.8, hemoglobin 10.7, normal troponin levels for total of 3 sets. He reported still having melanotic stool for which reason we consulted GI who will see him in consultation.  Assessment/Plan:    Principal Problem:   Acute blood loss anemia /  Upper GI bleed - Likely related to use of Goody powders. - Hemoglobin was 10.7 on the admission and this morning 9.7 - Appreciate the GI consult and recommendations - Continue nothing by mouth, IV fluids - Continue Protonix 40 mg twice daily  Active Problems:   Chest pain at rest - Perhaps related to gastritis from the powder use. Troponin levels were within normal limits 3 sets - 12-lead EKG showed sinus rhythm - Chest pain resolved at this point - We'll obtain 2-D echo    Leukocytosis - Likely reactive. No evidence of acute infectious process. Urinalysis is unremarkable. No reports of cough or shortness of breath. No fevers.    Benign essential HTN - Blood pressure is 134/73 - Continue Norvasc 5 mg daily  DVT Prophylaxis  - SCDs bilaterally due to risk of bleeding   Code Status: Full.  Family Communication:  plan of care discussed with the  patient Disposition Plan: Home once no further sings of bleed and hemoglobins tale, possibly by 06/02/2015   IV access:  Peripheral IV  Procedures and diagnostic studies:    No results found.  Medical Consultants:  [facility]   Other Consultants:  None   IAnti-Infectives:   None    Leisa Lenz, MD  [facility] Pager [phone]  Time spent in minutes: 25 minutes  If 7PM-7AM, please contact night-coverage [email or web address] Password [facility] 05/31/2015, 12:49 PM      HPI/Subjective: No acute overnight events. Patient reports still noticing melenotic stool.  Objective: Filed Vitals:   05/31/15 0400 05/31/15 0500 05/31/15 0644 05/31/15 1055  BP: 127/70 110/54 113/57 134/73  Pulse:   77 77  Temp:      TempSrc:      Resp: 17 14 17 12   SpO2:   98% 100%   No intake or output data in the 24 hours ending 05/31/15 1249  Exam:   General:  Pt is alert, follows commands appropriately, not in acute distress  Cardiovascular: Regular rate and rhythm, S1/S2 (+)  Respiratory: Clear to auscultation bilaterally, no wheezing, no crackles, no rhonchi  Abdomen: Soft, non tender, non distended, bowel sounds present  Extremities: No edema, pulses DP and PT palpable bilaterally  Neuro: Grossly nonfocal  Data Reviewed: Basic Metabolic Panel:  Recent Labs Lab 05/30/15 1631  NA 145  K 4.4  CL 109  CO2 26  GLUCOSE 122*  BUN 28*  CREATININE 1.04  CALCIUM 10.1   Liver Function Tests:  No results for input(s): AST, ALT, ALKPHOS, BILITOT, PROT, ALBUMIN in the last 168 hours. No results for input(s): LIPASE, AMYLASE in the last 168 hours. No results for input(s): AMMONIA in the last 168 hours. CBC:  Recent Labs Lab 05/30/15 1631 05/31/15 0428  WBC 12.8* 13.1*  HGB 10.7* 9.7*  HCT 32.7* 29.3*  MCV 91.6 93.6  PLT 352 337   Cardiac Enzymes:  Recent Labs Lab 05/30/15 2240 05/31/15 0125 05/31/15 0427  TROPONINI <0.03 <0.03 <0.03   BNP: Invalid  input(s): POCBNP CBG: No results for input(s): GLUCAP in the last 168 hours.  No results found for this or any previous visit (from the past 240 hour(s)).   Scheduled Meds: . amLODipine  5 mg Oral Daily  . nicotine  7 mg Transdermal Q0600  . pantoprazole (PROTONIX) IV  40 mg Intravenous Q12H   Continuous Infusions:

## 2015-05-31 NOTE — ED Notes (Signed)
Nurse drawing labs. 

## 2015-05-31 NOTE — Plan of Care (Signed)
Problem: Pain Managment: Goal: General experience of comfort will improve Outcome: Progressing Denies pain at this time.    Problem: Physical Regulation: Goal: Ability to maintain clinical measurements within normal limits will improve Outcome: Progressing BP 132/63 mmHg  Pulse 65  Temp(Src) 97.5 F (36.4 C) (Oral)  Resp 18  Ht 5\' 9"  (1.753 m)  Wt 89.313 kg (196 lb 14.4 oz)  BMI 29.06 kg/m2  SpO2 99%RA. WBC count 13.1.     Problem: Nutrition: Goal: Adequate nutrition will be maintained Outcome: Progressing Pt with good appetite.  Asking for a popsicle this evening.

## 2015-05-31 NOTE — Progress Notes (Signed)
CRITICAL VALUE ALERT  Critical value received:  Positive MRSA-pcr  Date of notification:  05/31/15  Time of notification:  2143  Critical value read back:Yes.    Nurse who received alert:  Carolyn Stare  MD notified (1st page):  Raliegh Ip. Kriby-NP  Time of first page:  2200  MD notified (2nd page):  Time of second page:  Responding MD:  Anette Riedel  Time MD responded:  2300

## 2015-05-31 NOTE — Op Note (Signed)
[facility] [place], [address]   ENDOSCOPY PROCEDURE REPORT  PATIENT: Adrian Miller, Adrian Miller  MR#: [id number] BIRTHDATE: 1940-06-29 , 75  yrs. old GENDER: male ENDOSCOPIST: Ladene Artist, MD, [facility] REFERRED BY:  [facility] PROCEDURE DATE:  05/31/2015 PROCEDURE:  EGD, diagnostic ASA CLASS:     Class II INDICATIONS:  melena. MEDICATIONS: Fentanyl 50 mcg IV, Versed 5 mg IV, Moderate sedation started 1532 and ended 1545. TOPICAL ANESTHETIC: Cetacaine Spray DESCRIPTION OF PROCEDURE: After the risks benefits and alternatives of the procedure were thoroughly explained, informed consent was obtained.  The [place] [id number] endoscope was introduced through the mouth and advanced to the second portion of the duodenum , Without limitations.  The instrument was slowly withdrawn as the mucosa was fully examined.    DUODENUM: Three medium sized non-bleeding non-bleeding, clean-based, round and shallow ulcers were found in the duodenal bulb.   The duodenal mucosa showed no abnormalities in the 2nd part of the duodenum. ESOPHAGUS: There was LA Class B esophagitis (One or more mucosal breaks > 42mm, but without continuity across mucosal folds) noted. There was a benign appearing stricture at the gastroesophageal junction.  The stricture was easily traversable. STOMACH: The mucosa of the stomach appeared normal.  Retroflexed views revealed a hiatal hernia.     The scope was then withdrawn from the patient and the procedure completed.  COMPLICATIONS: There were no immediate complications.  ENDOSCOPIC IMPRESSION: 1.   Three ulcers in the duodenal bulb 2.   LA Class B esophagitis 3.   Stricture at the gastroesophageal junction 4.   Small hiatal hernia  RECOMMENDATIONS: 1.  Anti-reflux regimen long term 2.  Completely discontinue all ASA/NSAID products 3.  PPI bid for 2 weeks then PPI qam long term 4.  Follow-up of Helicobacter pylori Ab status,  treat if positive   eSigned:  Ladene Artist, MD, [facility] 05/31/2015 3:51 PM

## 2015-06-01 ENCOUNTER — Observation Stay (HOSPITAL_BASED_OUTPATIENT_CLINIC_OR_DEPARTMENT_OTHER): Payer: Medicare Other

## 2015-06-01 ENCOUNTER — Encounter (HOSPITAL_COMMUNITY): Payer: Self-pay | Admitting: Gastroenterology

## 2015-06-01 DIAGNOSIS — D62 Acute posthemorrhagic anemia: Secondary | ICD-10-CM | POA: Diagnosis not present

## 2015-06-01 DIAGNOSIS — R079 Chest pain, unspecified: Secondary | ICD-10-CM

## 2015-06-01 DIAGNOSIS — K449 Diaphragmatic hernia without obstruction or gangrene: Secondary | ICD-10-CM

## 2015-06-01 DIAGNOSIS — K264 Chronic or unspecified duodenal ulcer with hemorrhage: Secondary | ICD-10-CM | POA: Diagnosis not present

## 2015-06-01 DIAGNOSIS — K209 Esophagitis, unspecified without bleeding: Secondary | ICD-10-CM | POA: Diagnosis present

## 2015-06-01 DIAGNOSIS — K222 Esophageal obstruction: Secondary | ICD-10-CM

## 2015-06-01 HISTORY — DX: Diaphragmatic hernia without obstruction or gangrene: K44.9

## 2015-06-01 HISTORY — DX: Esophageal obstruction: K22.2

## 2015-06-01 HISTORY — DX: Esophagitis, unspecified without bleeding: K20.90

## 2015-06-01 LAB — H. PYLORI ANTIBODY, IGG: H Pylori IgG: <0.9 U/mL (ref 0.0–0.8)

## 2015-06-01 LAB — HEMOGLOBIN AND HEMATOCRIT, BLOOD
HCT: 26.9 % — ABNORMAL LOW (ref 39.0–52.0)
Hemoglobin: 8.8 g/dL — ABNORMAL LOW (ref 13.0–17.0)

## 2015-06-01 NOTE — Care Management Obs Status (Signed)
[facility] NOTIFICATION   Patient Details  Name: Adrian Miller MRN: [id number] Date of Birth: 01/07/1941   Medicare Observation Status Notification Given:  Yes    MahabirJuliann Pulse, RN 06/01/2015, 2:29 PM

## 2015-06-01 NOTE — Care Management Note (Signed)
Case Management Note  Patient Details  Name: Adrian Miller MRN: [id number] Date of Birth: 1940/07/09  Subjective/Objective:75 y/o m admitted w/anemia, duodenal ulcer. From home.                    Action/Plan:d/c plan home.   Expected Discharge Date:   (UNKNOWN)               Expected Discharge Plan:  Home/Self Care  In-House Referral:     Discharge planning Services  CM Consult  Post Acute Care Choice:    Choice offered to:     DME Arranged:    DME Agency:     HH Arranged:    HH Agency:     Status of Service:  In process, will continue to follow  Medicare Important Message Given:    Date Medicare IM Given:    Medicare IM give by:    Date Additional Medicare IM Given:    Additional Medicare Important Message give by:     If discussed at [place] of Stay Meetings, dates discussed:    Additional Comments:  Dessa Phi, RN 06/01/2015, 11:21 AM

## 2015-06-01 NOTE — Progress Notes (Signed)
Progress Note   ROBLEY DUTTON [id number] DOB: October 09, 1940 DOA: 05/30/2015 PCP: Scarlette Calico, MD   Brief Narrative:   Adrian Miller is an 75 y.o. male with a PMH of hypertension, Bell's policy, chronic low back pain for which he was taking Goody powders who was admitted 05/30/15 with chief complaint of a one-week history of melenotic stools and a 1-2 hour history of midsternal chest pain.  In ED, patient was hemodynamically stable. Blood work was notable for leukocytosis of 12.8, hemoglobin 10.7, normal troponin levels for total of 3 sets. GI was subsequently consulted.  Assessment/Plan:    Principal Problem:  Acute blood loss anemia / Upper GI bleed secondary to duodenal ulcers and esophagitis - Status post EGD 05/31/15. - Continue Protonix 40 mg twice daily 2 weeks then daily. - Follow-up H. pylori antibody status. Treat if positive. - Currently on clear liquid diet. Advance to regular.  Active Problems:   MRSA carrier - Continue contact precautions and decontamination therapy.   Chest pain at rest - Perhaps related to gastritis from the powder use. Troponin levels were within normal limits 3 sets. - 12-lead EKG showed sinus rhythm without ischemic changes. - Chest pain resolved at this point. - Cancel 2-D echo.   Leukocytosis - Likely reactive.  - No evidence of acute infectious process.    Benign essential HTN - Continue Norvasc 5 mg daily.    DVT Prophylaxis  - SCDs bilaterally due to risk of bleeding   Family Communication/Anticipated D/C date and plan/Code Status   Family Communication: No family at bedside. Disposition Plan: Home when hemoglobin stable 24 hours with no further signs of bleeding and diet advanced. Anticipated D/C date:   06/02/15. Code Status: Full code.   IV Access:    Peripheral IV   Procedures and diagnostic studies:    EGD 05/31/15   Medical Consultants:    Gastroenterology: Ladene Artist,  MD  Anti-Infectives:   Anti-infectives    None      Subjective:    Adrian Miller feels well.  Eager to have his diet advanced.  No further black/bloody stools.  Objective:    Filed Vitals:   05/31/15 1640 05/31/15 1720 05/31/15 2100 06/01/15 0517  BP: 120/72 143/77 132/63 133/63  Pulse: 66 69 65 68  Temp:  97.6 F (36.4 C) 97.5 F (36.4 C) 97.8 F (36.6 C)  TempSrc:  Oral Oral Oral  Resp: 18 18 18 18   Height:  5\' 9"  (1.753 m)    Weight:  89.313 kg (196 lb 14.4 oz)    SpO2: 98% 100% 99% 97%    Intake/Output Summary (Last 24 hours) at 06/01/15 0907 Last data filed at 06/01/15 0600  Gross per 24 hour  Intake 555.83 ml  Output    375 ml  Net 180.83 ml   Filed Weights   05/31/15 1720  Weight: 89.313 kg (196 lb 14.4 oz)    Exam: Gen:  NAD, sitting up in chair Cardiovascular:  RRR, No M/R/G Respiratory:  Lungs CTAB Gastrointestinal:  Abdomen soft, NT/ND, + BS Extremities:  No C/E/C   Data Reviewed:    Labs: Basic Metabolic Panel:  Recent Labs Lab 05/30/15 1631  NA 145  K 4.4  CL 109  CO2 26  GLUCOSE 122*  BUN 28*  CREATININE 1.04  CALCIUM 10.1   GFR Estimated Creatinine Clearance: 68.8 mL/min (by C-G formula based on Cr of 1.04).  CBC:  Recent Labs Lab 05/30/15 1631 05/31/15  [id number] 06/01/15 0745  WBC 12.8* 13.1*  --   HGB 10.7* 9.7* 8.8*  HCT 32.7* 29.3* 26.9*  MCV 91.6 93.6  --   PLT 352 337  --    Cardiac Enzymes:  Recent Labs Lab 05/30/15 2240 05/31/15 0125 05/31/15 0427  TROPONINI <0.03 <0.03 <0.03   Microbiology Recent Results (from the past 240 hour(s))  MRSA PCR Screening     Status: Abnormal   Collection Time: 05/31/15  8:05 PM  Result Value Ref Range Status   MRSA by PCR POSITIVE (A) NEGATIVE Final    Comment:        The GeneXpert MRSA Assay (FDA approved for NASAL specimens only), is one component of a comprehensive MRSA colonization surveillance program. It is not intended to diagnose MRSA infection nor to  guide or monitor treatment for MRSA infections. RESULT CALLED TO, READ BACK BY AND VERIFIED WITH: DAWN BROWN RN 1.24.17 @ 2143 BY RICEJ      Medications:   . amLODipine  5 mg Oral Daily  . Chlorhexidine Gluconate Cloth  6 each Topical Q0600  . mupirocin ointment  1 application Nasal BID  . nicotine  7 mg Transdermal Q0600  . pantoprazole (PROTONIX) IV  40 mg Intravenous Q12H   Continuous Infusions:    Time spent: 25 minutes.     [facility] Pager [phone]. If unable to reach me by pager, please call my cell phone at [phone].  *Please refer to [email or web address], password [email or web address] to get updated schedule on who will round on this patient, as hospitalists switch teams weekly. If 7PM-7AM, please contact night-coverage at [email or web address], password [email or web address] for any overnight needs.  06/01/2015, 9:07 AM

## 2015-06-01 NOTE — Progress Notes (Signed)
Cancel 2-D echo

## 2015-06-02 ENCOUNTER — Telehealth: Payer: Self-pay | Admitting: *Deleted

## 2015-06-02 ENCOUNTER — Encounter (HOSPITAL_COMMUNITY): Payer: Self-pay | Admitting: Internal Medicine

## 2015-06-02 DIAGNOSIS — Z7982 Long term (current) use of aspirin: Secondary | ICD-10-CM | POA: Diagnosis not present

## 2015-06-02 DIAGNOSIS — K264 Chronic or unspecified duodenal ulcer with hemorrhage: Secondary | ICD-10-CM | POA: Diagnosis present

## 2015-06-02 DIAGNOSIS — M1990 Unspecified osteoarthritis, unspecified site: Secondary | ICD-10-CM | POA: Diagnosis present

## 2015-06-02 DIAGNOSIS — I10 Essential (primary) hypertension: Secondary | ICD-10-CM | POA: Diagnosis present

## 2015-06-02 DIAGNOSIS — G8929 Other chronic pain: Secondary | ICD-10-CM | POA: Diagnosis present

## 2015-06-02 DIAGNOSIS — Z22322 Carrier or suspected carrier of Methicillin resistant Staphylococcus aureus: Secondary | ICD-10-CM | POA: Diagnosis not present

## 2015-06-02 DIAGNOSIS — Z79899 Other long term (current) drug therapy: Secondary | ICD-10-CM | POA: Diagnosis not present

## 2015-06-02 DIAGNOSIS — D62 Acute posthemorrhagic anemia: Secondary | ICD-10-CM | POA: Diagnosis present

## 2015-06-02 DIAGNOSIS — J449 Chronic obstructive pulmonary disease, unspecified: Secondary | ICD-10-CM | POA: Diagnosis present

## 2015-06-02 DIAGNOSIS — K449 Diaphragmatic hernia without obstruction or gangrene: Secondary | ICD-10-CM | POA: Diagnosis present

## 2015-06-02 DIAGNOSIS — R079 Chest pain, unspecified: Secondary | ICD-10-CM | POA: Diagnosis present

## 2015-06-02 DIAGNOSIS — K222 Esophageal obstruction: Secondary | ICD-10-CM | POA: Diagnosis present

## 2015-06-02 DIAGNOSIS — K921 Melena: Secondary | ICD-10-CM | POA: Diagnosis present

## 2015-06-02 DIAGNOSIS — N400 Enlarged prostate without lower urinary tract symptoms: Secondary | ICD-10-CM | POA: Diagnosis present

## 2015-06-02 DIAGNOSIS — D72829 Elevated white blood cell count, unspecified: Secondary | ICD-10-CM | POA: Diagnosis present

## 2015-06-02 DIAGNOSIS — Z96653 Presence of artificial knee joint, bilateral: Secondary | ICD-10-CM | POA: Diagnosis present

## 2015-06-02 DIAGNOSIS — T39015A Adverse effect of aspirin, initial encounter: Secondary | ICD-10-CM | POA: Diagnosis present

## 2015-06-02 DIAGNOSIS — M545 Low back pain: Secondary | ICD-10-CM | POA: Diagnosis present

## 2015-06-02 DIAGNOSIS — K209 Esophagitis, unspecified: Secondary | ICD-10-CM | POA: Diagnosis present

## 2015-06-02 DIAGNOSIS — F17210 Nicotine dependence, cigarettes, uncomplicated: Secondary | ICD-10-CM | POA: Diagnosis present

## 2015-06-02 DIAGNOSIS — Z886 Allergy status to analgesic agent status: Secondary | ICD-10-CM | POA: Diagnosis not present

## 2015-06-02 DIAGNOSIS — E785 Hyperlipidemia, unspecified: Secondary | ICD-10-CM | POA: Diagnosis present

## 2015-06-02 DIAGNOSIS — R072 Precordial pain: Secondary | ICD-10-CM | POA: Diagnosis present

## 2015-06-02 LAB — HEMOGLOBIN AND HEMATOCRIT, BLOOD
HCT: 27.8 % — ABNORMAL LOW (ref 39.0–52.0)
Hemoglobin: 9.1 g/dL — ABNORMAL LOW (ref 13.0–17.0)

## 2015-06-02 MED ORDER — PANTOPRAZOLE SODIUM 40 MG PO TBEC: 40.0000 mg | DELAYED_RELEASE_TABLET | Freq: Every day | ORAL | Status: DC

## 2015-06-02 MED ORDER — OXYCODONE-ACETAMINOPHEN 10-325 MG PO TABS: 1.0000 | ORAL_TABLET | ORAL | Status: DC | PRN

## 2015-06-02 NOTE — Care Management Note (Signed)
Case Management Note  Patient Details  Name: Adrian Miller MRN: [id number] Date of Birth: Apr 22, 1941  Subjective/Objective: Unable to give CC44 since already d/c, & left bldg.                   Action/Plan:d/c home no needs or orders.   Expected Discharge Date:   (UNKNOWN)               Expected Discharge Plan:  Home/Self Care  In-House Referral:     Discharge planning Services  CM Consult  Post Acute Care Choice:    Choice offered to:     DME Arranged:    DME Agency:     HH Arranged:    [place] Agency:     Status of Service:  Completed, signed off  Medicare Important Message Given:    Date Medicare IM Given:    Medicare IM give by:    Date Additional Medicare IM Given:    Additional Medicare Important Message give by:     If discussed at [place] of Stay Meetings, dates discussed:    Additional Comments:  Dessa Phi, RN 06/02/2015, 10:35 AM

## 2015-06-02 NOTE — Progress Notes (Signed)
Patient discharged home. Paper prescription for Percocet given to patient. Discharge paperwork explained and patient verbalized understanding. F/U appt with PCP made prior to D/C. Patient assisted to exit by RN using a W/C. Daughter stated she was "5 minutes away" so patient requested to wait outside on the bench alone until daughter arrived. All belongings sent with patient. Patient A&O and stable upon D/C.

## 2015-06-02 NOTE — Telephone Encounter (Signed)
Transition Care Management Follow-up Telephone Call   Date discharged? 06/02/15   How have you been since you were released from the hospital? Pt states he is ok   Do you understand why you were in the hospital? YES   Do you understand the discharge instructions? YES   Where were you discharged to? Home   Items Reviewed:  Medications reviewed: YES  Allergies reviewed: YES  Dietary changes reviewed: YES  Referrals reviewed: Referral for Gastroenterologist has not been set- up   Functional Questionnaire:   Activities of Daily Living (ADLs):   He states he are independent in the following: ambulation, bathing and hygiene, feeding, continence, grooming, toileting and dressing States he doesn't require assistance    Any transportation issues/concerns?: NO   Any patient concerns? NO   Confirmed importance and date/time of follow-up visits scheduled YES, made 06/14/15  Provider Appointment booked with Dr. Ronnald Ramp  Confirmed with patient if condition begins to worsen call PCP or go to the ER.  Patient was given the office number and encouraged to call back with question or concerns.  : YES

## 2015-06-02 NOTE — Discharge Summary (Signed)
Physician Discharge Summary  SHREE KERNER [id number] DOB: Sep 05, 1940 DOA: 05/30/2015  PCP: Scarlette Calico, MD  Admit date: 05/30/2015 Discharge date: 06/02/2015   Recommendations for Outpatient Follow-Up:   1. F/U with Dr. Ronnald Ramp in 2 weeks for repeat CBC.   Discharge Diagnosis:   Principal Problem:    Duodenal ulcer hemorrhage Active Problems:    Chest pain at rest    Upper GI bleed    Acute blood loss anemia    Leukocytosis    Benign essential HTN    Melena    Hiatal hernia    Acute esophagitis    Esophageal stricture   Discharge disposition:  Home.    Discharge Condition: Improved.  Diet recommendation: Low sodium, heart healthy.    History of Present Illness:   Adrian Miller is an 75 y.o. male with a PMH of hypertension, Bell's policy, chronic low back pain for which he was taking Goody powders who was admitted 05/30/15 with chief complaint of a one-week history of melenotic stools and a 1-2 hour history of midsternal chest pain.  In ED, patient was hemodynamically stable. Blood work was notable for leukocytosis of 12.8, hemoglobin 10.7, normal troponin levels for total of 3 sets. GI was subsequently consulted.   Hospital Course by Problem:    Principal Problem:  Acute blood loss anemia / Upper GI bleed secondary to duodenal ulcers and esophagitis - Status post EGD 05/31/15. Hemoglobin stable x 24 hours with no reports of further melanotic stools. - Baseline hemoglobin 15.3, D/C hemoglobin 9.1. - Continue Protonix 40 mg twice daily 2 weeks then daily. - H. pylori antibody negative. - Diet successfully advanced prior to D/C. - Instructed to avoid NSAIDS/Aspirin.  Active Problems:  MRSA carrier - Maintained on contact precautions and treated with decontamination therapy.   Chest pain at rest - Perhaps related to gastritis from the powder use. Troponin levels were within normal limits 3 sets. - 12-lead EKG showed sinus rhythm without  ischemic changes. - Chest pain resolved at this point, no further work up indicated.   Leukocytosis - Likely reactive.  - No evidence of acute infectious process.    Benign essential HTN - Continue Norvasc 5 mg daily.    Medical Consultants:    Gastroenterology: Ladene Artist, MD   Discharge Exam:   Filed Vitals:   06/01/15 2119 06/02/15 0546  BP: 143/69 130/66  Pulse: 73 65  Temp: 98 F (36.7 C) 98.5 F (36.9 C)  Resp: 18 16   Filed Vitals:   06/01/15 0517 06/01/15 1408 06/01/15 2119 06/02/15 0546  BP: 133/63 126/68 143/69 130/66  Pulse: 68 81 73 65  Temp: 97.8 F (36.6 C) 98.6 F (37 C) 98 F (36.7 C) 98.5 F (36.9 C)  TempSrc: Oral Oral Oral Oral  Resp: 18 18 18 16   Height:      Weight:      SpO2: 97% 99% 100% 99%    Gen:  NAD Cardiovascular:  RRR, No M/R/G Respiratory: Lungs CTAB Gastrointestinal: Abdomen soft, NT/ND with normal active bowel sounds. Extremities: No C/E/C   The results of significant diagnostics from this hospitalization (including imaging, microbiology, ancillary and laboratory) are listed below for reference.     Procedures and Diagnostic Studies:   EGD 05/31/15  ENDOSCOPIC IMPRESSION: 1. Three ulcers in the duodenal bulb 2. LA Class B esophagitis 3. Stricture at the gastroesophageal junction 4. Small hiatal hernia  RECOMMENDATIONS: 1. Anti-reflux regimen long term 2. Completely discontinue all ASA/NSAID products  3. PPI bid for 2 weeks then PPI qam long term 4. Follow-up of Helicobacter pylori Ab status, treat if positive   Labs:   Basic Metabolic Panel:  Recent Labs Lab 05/30/15 1631  NA 145  K 4.4  CL 109  CO2 26  GLUCOSE 122*  BUN 28*  CREATININE 1.04  CALCIUM 10.1   GFR Estimated Creatinine Clearance: 68.8 mL/min (by C-G formula based on Cr of 1.04).  CBC:  Recent Labs Lab 05/30/15 1631 05/31/15 0428 06/01/15 0745 06/02/15 0730  WBC 12.8* 13.1*  --   --   HGB 10.7* 9.7* 8.8*  9.1*  HCT 32.7* 29.3* 26.9* 27.8*  MCV 91.6 93.6  --   --   PLT 352 337  --   --    Cardiac Enzymes:  Recent Labs Lab 05/30/15 2240 05/31/15 0125 05/31/15 0427  TROPONINI <0.03 <0.03 <0.03   Microbiology Recent Results (from the past 240 hour(s))  MRSA PCR Screening     Status: Abnormal   Collection Time: 05/31/15  8:05 PM  Result Value Ref Range Status   MRSA by PCR POSITIVE (A) NEGATIVE Final    Comment:        The GeneXpert MRSA Assay (FDA approved for NASAL specimens only), is one component of a comprehensive MRSA colonization surveillance program. It is not intended to diagnose MRSA infection nor to guide or monitor treatment for MRSA infections. RESULT CALLED TO, READ BACK BY AND VERIFIED WITH: DAWN BROWN RN 1.24.17 @ 2143 BY RICEJ      Discharge Instructions:   Discharge Instructions    Call MD for:  extreme fatigue    Complete by:  As directed      Call MD for:  persistant dizziness or light-headedness    Complete by:  As directed      Call MD for:  persistant nausea and vomiting    Complete by:  As directed      Diet - low sodium heart healthy    Complete by:  As directed      Discharge instructions    Complete by:  As directed   Avoid any over-the-counter pain relievers except for tylenol (no aspirin, Motrin, Aleve, Ibuprofen, Naprosyn, etc).  Call your doctor for any recurrent black or bloody stools, dizziness, weakness, or feeling like you might pass out.     Increase activity slowly    Complete by:  As directed             Medication List    STOP taking these medications        BC HEADACHE POWDER PO      TAKE these medications        acetaminophen 500 MG tablet  Commonly known as:  TYLENOL  Take 1,000 mg by mouth every 8 (eight) hours as needed for mild pain or moderate pain.     amLODipine 5 MG tablet  Commonly known as:  NORVASC  Take 1 tablet (5 mg total) by mouth daily.     oxyCODONE-acetaminophen 10-325 MG tablet  Commonly  known as:  PERCOCET  Take 1 tablet by mouth every 4 (four) hours as needed for pain.     pantoprazole 40 MG tablet  Commonly known as:  PROTONIX  Take 1 tablet (40 mg total) by mouth daily.           Follow-up Information    Follow up with Scarlette Calico, MD. Schedule an appointment as soon as possible for a visit in 2  weeks.   Specialty:  Internal Medicine   Why:  Hospital follow up.   Contact information:   [address]LO[address] [place] [address] [phone]        Time coordinating discharge: 35 minutes.  Signed:  Cheikh Bramble  Pager [phone] [facility] 06/02/2015, 8:17 AM

## 2015-06-14 ENCOUNTER — Encounter: Payer: Self-pay | Admitting: Internal Medicine

## 2015-06-14 ENCOUNTER — Ambulatory Visit (INDEPENDENT_AMBULATORY_CARE_PROVIDER_SITE_OTHER): Payer: Medicare Other | Admitting: Internal Medicine

## 2015-06-14 ENCOUNTER — Other Ambulatory Visit (INDEPENDENT_AMBULATORY_CARE_PROVIDER_SITE_OTHER): Payer: Medicare Other

## 2015-06-14 VITALS — BP 130/88 | HR 70 | Temp 97.6°F | Resp 16 | Ht 69.0 in | Wt 211.0 lb

## 2015-06-14 DIAGNOSIS — M25559 Pain in unspecified hip: Secondary | ICD-10-CM | POA: Insufficient documentation

## 2015-06-14 DIAGNOSIS — K264 Chronic or unspecified duodenal ulcer with hemorrhage: Secondary | ICD-10-CM

## 2015-06-14 DIAGNOSIS — M159 Polyosteoarthritis, unspecified: Secondary | ICD-10-CM

## 2015-06-14 DIAGNOSIS — D508 Other iron deficiency anemias: Secondary | ICD-10-CM | POA: Insufficient documentation

## 2015-06-14 DIAGNOSIS — I10 Essential (primary) hypertension: Secondary | ICD-10-CM | POA: Diagnosis not present

## 2015-06-14 DIAGNOSIS — R7303 Prediabetes: Secondary | ICD-10-CM | POA: Insufficient documentation

## 2015-06-14 DIAGNOSIS — R739 Hyperglycemia, unspecified: Secondary | ICD-10-CM

## 2015-06-14 DIAGNOSIS — M25551 Pain in right hip: Secondary | ICD-10-CM | POA: Diagnosis not present

## 2015-06-14 DIAGNOSIS — D510 Vitamin B12 deficiency anemia due to intrinsic factor deficiency: Secondary | ICD-10-CM

## 2015-06-14 DIAGNOSIS — D519 Vitamin B12 deficiency anemia, unspecified: Secondary | ICD-10-CM

## 2015-06-14 LAB — HEMOGLOBIN A1C: Hgb A1c MFr Bld: 5.9 % (ref 4.6–6.5)

## 2015-06-14 LAB — BASIC METABOLIC PANEL
BUN: 12 mg/dL (ref 6–23)
CO2: 28 mEq/L (ref 19–32)
Calcium: 9.2 mg/dL (ref 8.4–10.5)
Chloride: 108 mEq/L (ref 96–112)
Creatinine, Ser: 0.79 mg/dL (ref 0.40–1.50)
GFR: 123.27 mL/min (ref >60.00)
Glucose, Bld: 127 mg/dL — ABNORMAL HIGH (ref 70–99)
Potassium: 4 mEq/L (ref 3.5–5.1)
Sodium: 140 mEq/L (ref 135–145)

## 2015-06-14 LAB — CBC WITH DIFFERENTIAL/PLATELET
Basophils Absolute: 0 10*3/uL (ref 0.0–0.1)
Basophils Relative: 0.1 % (ref 0.0–3.0)
Eosinophils Absolute: 0.3 10*3/uL (ref 0.0–0.7)
Eosinophils Relative: 3.7 % (ref 0.0–5.0)
HCT: 32.4 % — ABNORMAL LOW (ref 39.0–52.0)
Hemoglobin: 10.4 g/dL — ABNORMAL LOW (ref 13.0–17.0)
Lymphocytes Relative: 22.1 % (ref 12.0–46.0)
Lymphs Abs: 1.7 10*3/uL (ref 0.7–4.0)
MCHC: 32 g/dL (ref 30.0–36.0)
MCV: 88.6 fl (ref 78.0–100.0)
Monocytes Absolute: 0.5 10*3/uL (ref 0.1–1.0)
Monocytes Relative: 7.1 % (ref 3.0–12.0)
Neutro Abs: 5.1 10*3/uL (ref 1.4–7.7)
Neutrophils Relative %: 67 % (ref 43.0–77.0)
Platelets: 642 10*3/uL — ABNORMAL HIGH (ref 150.0–400.0)
RBC: 3.66 Mil/uL — ABNORMAL LOW (ref 4.22–5.81)
RDW: 17.4 % — ABNORMAL HIGH (ref 11.5–15.5)
WBC: 7.6 10*3/uL (ref 4.0–10.5)

## 2015-06-14 LAB — IBC PANEL
Iron: 16 ug/dL — ABNORMAL LOW (ref 42–165)
Saturation Ratios: 4.1 % — ABNORMAL LOW (ref 20.0–50.0)
Transferrin: 277 mg/dL (ref 212.0–360.0)

## 2015-06-14 LAB — FOLATE: Folate: 9.3 ng/mL (ref >5.9)

## 2015-06-14 LAB — VITAMIN B12: Vitamin B-12: 137 pg/mL — ABNORMAL LOW (ref 211–911)

## 2015-06-14 LAB — FERRITIN: Ferritin: 16.3 ng/mL — ABNORMAL LOW (ref 22.0–322.0)

## 2015-06-14 MED ORDER — OXYCODONE-ACETAMINOPHEN 10-325 MG PO TABS: 1.0000 | ORAL_TABLET | Freq: Three times a day (TID) | ORAL | Status: DC | PRN

## 2015-06-14 MED ORDER — CYANOCOBALAMIN 2000 MCG PO TABS: 2000.0000 ug | ORAL_TABLET | Freq: Every day | ORAL | Status: DC

## 2015-06-14 MED ORDER — FERROUS SULFATE 325 (65 FE) MG PO TABS: 325.0000 mg | ORAL_TABLET | Freq: Three times a day (TID) | ORAL | Status: DC

## 2015-06-14 NOTE — Progress Notes (Signed)
Pre visit review using our clinic review tool, if applicable. No additional management support is needed unless otherwise documented below in the visit note. 

## 2015-06-14 NOTE — Progress Notes (Signed)
Subjective:  Patient ID: Adrian Miller, male    DOB: 07/02/1940  Age: 75 y.o. MRN: [id number]  CC: Anemia and Osteoarthritis   HPI Adrian Miller presents for a hospital follow-up after he recently developed a duodenal ulcer related to NSAID intake. He tells me that he feels much better with no abdominal pain or bleeding. He has a significant amount of degenerative joint disease and if he is not able to take a narcotic for the pain he says he may revert back to taking an anti-inflammatory like BC powder. He complains mostly of right hip pain today. He tells me that he previously saw an orthopedist and had a steroid injection in the right hip. He wants to know if he can have another steroid injection in the hip and he also wants a refill on Percocet for pain.  Principal Problem:   Duodenal ulcer hemorrhage Active Problems:   Chest pain at rest   Upper GI bleed   Acute blood loss anemia   Leukocytosis   Benign essential HTN   Melena   Hiatal hernia   Acute esophagitis   Esophageal stricture  Outpatient Prescriptions Prior to Visit  Medication Sig Dispense Refill  . acetaminophen (TYLENOL) 500 MG tablet Take 1,000 mg by mouth every 8 (eight) hours as needed for mild pain or moderate pain.    [name] amLODipine (NORVASC) 5 MG tablet Take 1 tablet (5 mg total) by mouth daily. 90 tablet 3  . pantoprazole (PROTONIX) 40 MG tablet Take 1 tablet (40 mg total) by mouth daily. 60 tablet 2  . oxyCODONE-acetaminophen (PERCOCET) 10-325 MG tablet Take 1 tablet by mouth every 4 (four) hours as needed for pain. 30 tablet 0   No facility-administered medications prior to visit.    ROS Review of Systems  Constitutional: Negative.  Negative for fever, chills, diaphoresis, appetite change, fatigue and unexpected weight change.  HENT: Negative.  Negative for trouble swallowing and voice change.   Eyes: Negative.   Respiratory: Negative.  Negative for cough, choking, chest tightness,  shortness of breath and stridor.   Cardiovascular: Negative.  Negative for chest pain, palpitations and leg swelling.  Gastrointestinal: Negative.  Negative for nausea, vomiting, abdominal pain, diarrhea, constipation and blood in stool.  Endocrine: Negative.   Genitourinary: Negative.   Musculoskeletal: Positive for arthralgias. Negative for myalgias and neck pain.  Skin: Negative.  Negative for color change.  Allergic/Immunologic: Negative.   Neurological: Negative.  Negative for dizziness, tremors, speech difficulty, weakness, light-headedness and numbness.  Hematological: Negative.  Negative for adenopathy. Does not bruise/bleed easily.  Psychiatric/Behavioral: Negative.     Objective:  BP 130/88 mmHg  Pulse 70  Temp(Src) 97.6 F (36.4 C) (Oral)  Resp 16  Ht 5\' 9"  (1.753 m)  Wt 211 lb (95.709 kg)  BMI 31.15 kg/m2  SpO2 99%  BP Readings from Last 3 Encounters:  06/14/15 130/88  06/02/15 130/66  02/24/15 163/61    Wt Readings from Last 3 Encounters:  06/14/15 211 lb (95.709 kg)  05/31/15 196 lb 14.4 oz (89.313 kg)  02/22/15 221 lb 8 oz (100.472 kg)    Physical Exam  Constitutional: He is oriented to person, place, and time. He appears well-developed and well-nourished. No distress.  HENT:  Head: Normocephalic and atraumatic.  Mouth/Throat: Oropharynx is clear and moist. No oropharyngeal exudate.  Eyes: Conjunctivae are normal. Right eye exhibits no discharge. Left eye exhibits no discharge. No scleral icterus.  Neck: Normal range of motion. Neck supple. No JVD  present. No tracheal deviation present. No thyromegaly present.  Cardiovascular: Normal rate, regular rhythm, normal heart sounds and intact distal pulses.  Exam reveals no gallop and no friction rub.   No murmur heard. Pulmonary/Chest: Effort normal and breath sounds normal. No stridor. No respiratory distress. He has no wheezes. He has no rales. He exhibits no tenderness.  Abdominal: Soft. Bowel sounds are  normal. He exhibits no distension and no mass. There is no tenderness. There is no rebound and no guarding.  Musculoskeletal: Normal range of motion. He exhibits no edema or tenderness.  DJD is noted in his shoulders and knees.  Lymphadenopathy:    He has no cervical adenopathy.  Neurological: He is oriented to person, place, and time.  Skin: Skin is warm and dry. No rash noted. He is not diaphoretic. No erythema. No pallor.  Vitals reviewed.   Lab Results  Component Value Date   WBC 7.6 06/14/2015   HGB 10.4* 06/14/2015   HCT 32.4* 06/14/2015   PLT 642.0* 06/14/2015   GLUCOSE 127* 06/14/2015   CHOL 154 03/18/2014   TRIG 107.0 03/18/2014   HDL 26.70* 03/18/2014   LDLCALC 106* 03/18/2014   ALT 13 03/18/2014   AST 18 03/18/2014   NA 140 06/14/2015   K 4.0 06/14/2015   CL 108 06/14/2015   CREATININE 0.79 06/14/2015   BUN 12 06/14/2015   CO2 28 06/14/2015   TSH 1.32 03/18/2014   PSA 0.87 03/18/2014   INR 1.08 02/11/2015   HGBA1C 5.9 06/14/2015    No results found.  Assessment & Plan:   Adrian Miller was seen today for anemia and osteoarthritis.  Diagnoses and all orders for this visit:  Pernicious anemia- his anemia has improved some but he is deficient in B12 and iron -     CBC with Differential/Platelet; Future -     IBC panel; Future -     Ferritin; Future -     Folate; Future -     Vitamin B12; Future  Benign essential HTN- his blood pressure is adequately well controlled, lites and renal function are stable. -     Basic metabolic panel; Future  Hyperglycemia- he is prediabetic, no meds are needed at this time -     Basic metabolic panel; Future -     Hemoglobin A1c; Future  Hip pain, acute, right -     Ambulatory referral to Sports Medicine  Generalized OA -     Discontinue: oxyCODONE-acetaminophen (PERCOCET) 10-325 MG tablet; Take 1 tablet by mouth every 8 (eight) hours as needed for pain. -     Discontinue: oxyCODONE-acetaminophen (PERCOCET) 10-325 MG tablet;  Take 1 tablet by mouth every 8 (eight) hours as needed for pain. -     oxyCODONE-acetaminophen (PERCOCET) 10-325 MG tablet; Take 1 tablet by mouth every 8 (eight) hours as needed for pain.  B12 deficiency anemia- he will come in for B12 injection and will also start daily oral B12 supplementation -     cyanocobalamin 2000 MCG tablet; Take 1 tablet (2,000 mcg total) by mouth daily.  Other iron deficiency anemias- will start iron replacement therapy. -     ferrous sulfate 325 (65 FE) MG tablet; Take 1 tablet (325 mg total) by mouth 3 (three) times daily with meals.  I have discontinued Mr. Pultz's HYDROcodone-acetaminophen. I am also having him start on ferrous sulfate and cyanocobalamin. Additionally, I am having him maintain his amLODipine, acetaminophen, pantoprazole, and oxyCODONE-acetaminophen.  Meds ordered this encounter  Medications  .  DISCONTD: HYDROcodone-acetaminophen (NORCO/VICODIN) 5-325 MG tablet    Sig:     Refill:  0  . DISCONTD: oxyCODONE-acetaminophen (PERCOCET) 10-325 MG tablet    Sig: Take 1 tablet by mouth every 8 (eight) hours as needed for pain.    Dispense:  60 tablet    Refill:  0    Fill on of after 06/14/15  . DISCONTD: oxyCODONE-acetaminophen (PERCOCET) 10-325 MG tablet    Sig: Take 1 tablet by mouth every 8 (eight) hours as needed for pain.    Dispense:  60 tablet    Refill:  0    Fill on of after 07/12/15  . oxyCODONE-acetaminophen (PERCOCET) 10-325 MG tablet    Sig: Take 1 tablet by mouth every 8 (eight) hours as needed for pain.    Dispense:  60 tablet    Refill:  0    Fill on of after 08/12/15  . ferrous sulfate 325 (65 FE) MG tablet    Sig: Take 1 tablet (325 mg total) by mouth 3 (three) times daily with meals.    Dispense:  90 tablet    Refill:  5  . cyanocobalamin 2000 MCG tablet    Sig: Take 1 tablet (2,000 mcg total) by mouth daily.    Dispense:  90 tablet    Refill:  3     Follow-up: Return in about 3 months (around 09/11/2015).  Scarlette Calico, MD

## 2015-06-14 NOTE — Patient Instructions (Signed)
Anemia, Nonspecific Anemia is a condition in which the concentration of red blood cells or hemoglobin in the blood is below normal. Hemoglobin is a substance in red blood cells that carries oxygen to the tissues of the body. Anemia results in not enough oxygen reaching these tissues.  CAUSES  Common causes of anemia include:   Excessive bleeding. Bleeding may be internal or external. This includes excessive bleeding from periods (in women) or from the intestine.   Poor nutrition.   Chronic kidney, thyroid, and liver disease.  Bone marrow disorders that decrease red blood cell production.  Cancer and treatments for cancer.  HIV, AIDS, and their treatments.  Spleen problems that increase red blood cell destruction.  Blood disorders.  Excess destruction of red blood cells due to infection, medicines, and autoimmune disorders. SIGNS AND SYMPTOMS   Minor weakness.   Dizziness.   Headache.  Palpitations.   Shortness of breath, especially with exercise.   Paleness.  Cold sensitivity.  Indigestion.  Nausea.  Difficulty sleeping.  Difficulty concentrating. Symptoms may occur suddenly or they may develop slowly.  DIAGNOSIS  Additional blood tests are often needed. These help your health care provider determine the best treatment. Your health care provider will check your stool for blood and look for other causes of blood loss.  TREATMENT  Treatment varies depending on the cause of the anemia. Treatment can include:   Supplements of iron, vitamin B12, or folic acid.   Hormone medicines.   A blood transfusion. This may be needed if blood loss is severe.   Hospitalization. This may be needed if there is significant continual blood loss.   Dietary changes.  Spleen removal. HOME CARE INSTRUCTIONS Keep all follow-up appointments. It often takes many weeks to correct anemia, and having your health care provider check on your condition and your response to  treatment is very important. SEEK IMMEDIATE MEDICAL CARE IF:   You develop extreme weakness, shortness of breath, or chest pain.   You become dizzy or have trouble concentrating.  You develop heavy vaginal bleeding.   You develop a rash.   You have bloody or black, tarry stools.   You faint.   You vomit up blood.   You vomit repeatedly.   You have abdominal pain.  You have a fever or persistent symptoms for more than 2-3 days.   You have a fever and your symptoms suddenly get worse.   You are dehydrated.  MAKE SURE YOU:  Understand these instructions.  Will watch your condition.  Will get help right away if you are not doing well or get worse.   This information is not intended to replace advice given to you by your health care provider. Make sure you discuss any questions you have with your health care provider.   Document Released: 05/31/2004 Document Revised: 12/24/2012 Document Reviewed: 10/17/2012 Elsevier Interactive Patient Education 2016 [employer]  

## 2015-06-15 ENCOUNTER — Other Ambulatory Visit (INDEPENDENT_AMBULATORY_CARE_PROVIDER_SITE_OTHER): Payer: Medicare Other

## 2015-06-15 ENCOUNTER — Encounter: Payer: Self-pay | Admitting: Family Medicine

## 2015-06-15 ENCOUNTER — Ambulatory Visit (INDEPENDENT_AMBULATORY_CARE_PROVIDER_SITE_OTHER): Payer: Medicare Other

## 2015-06-15 ENCOUNTER — Encounter: Payer: Self-pay | Admitting: Internal Medicine

## 2015-06-15 ENCOUNTER — Ambulatory Visit (INDEPENDENT_AMBULATORY_CARE_PROVIDER_SITE_OTHER): Payer: Medicare Other | Admitting: Family Medicine

## 2015-06-15 VITALS — BP 134/82 | HR 70 | Ht 69.0 in | Wt 208.0 lb

## 2015-06-15 DIAGNOSIS — M12811 Other specific arthropathies, not elsewhere classified, right shoulder: Secondary | ICD-10-CM | POA: Diagnosis not present

## 2015-06-15 DIAGNOSIS — E538 Deficiency of other specified B group vitamins: Secondary | ICD-10-CM

## 2015-06-15 DIAGNOSIS — M1611 Unilateral primary osteoarthritis, right hip: Secondary | ICD-10-CM

## 2015-06-15 DIAGNOSIS — M1990 Unspecified osteoarthritis, unspecified site: Secondary | ICD-10-CM

## 2015-06-15 DIAGNOSIS — M75101 Unspecified rotator cuff tear or rupture of right shoulder, not specified as traumatic: Secondary | ICD-10-CM

## 2015-06-15 MED ORDER — CYANOCOBALAMIN 1000 MCG/ML IJ SOLN
1000.0000 ug | Freq: Once | INTRAMUSCULAR | Status: AC
  Administered 2015-06-15: 1000 ug via INTRAMUSCULAR

## 2015-06-15 NOTE — Assessment & Plan Note (Signed)
Improvement noted. Will treat his pain with the narcotic. He agrees to avoid NSAIDs, tobacco abuse and alcohol. We'll continue the proton pump inhibitor.

## 2015-06-15 NOTE — Progress Notes (Signed)
[name] [address], [place] [address] Phone: [phone] Subjective:     CC: Right hip and right shoulder pain  [id number] Adrian Miller is a 75 y.o. male coming in with complaint of right hip and right shoulder pain.  Regarding patient's right hip we do know the patient does have severe arthritic changes of the right hip. Patient is going to be scheduled for a hip replacement in June or July. Does not want to do it at this point. Last injection was greater than 4 months ago. Looking to have another one. Patient states that it is starting to affect his daily activities. Denies any numbness or tingling in the foot. Denies any giving out on him. Does walk with a cane.   patient is also complaining of right shoulder pain. This is a new problem. States that he has noticed some weakness on this side. States that it hurts with certain movements especially above his head or behind his back. States that rolling over at night can give him a significant amount of pain that wakes him up. Denies any numbness. Rates the severity pain is 8 out of 10. Not responding to over-the-counter medications. Has pain medication from primary care doc.  Past Medical History  Diagnosis Date  . Acute esophagitis 06/01/2015  . Duodenal ulcer hemorrhage   . Esophageal stricture 06/01/2015  . Hiatal hernia 06/01/2015  . Benign essential HTN 05/31/2015   Past Surgical History  Procedure Laterality Date  . Left rotator cuff repair  2002  . Hernia repair  1975  . Replacement total knee Left   . Total knee arthroplasty Right 02/22/2015    Procedure: TOTAL KNEE ARTHROPLASTY;  Surgeon: Melrose Nakayama, MD;  Location: [facility];  Service: Orthopedics;  Laterality: Right;  . Esophagogastroduodenoscopy N/A 05/31/2015    Procedure: ESOPHAGOGASTRODUODENOSCOPY (EGD);  Surgeon: Ladene Artist, MD;  Location: [facility];  Service: Endoscopy;  Laterality: N/A;   Social History   Social  History  . Marital Status: Single    Spouse Name: N/A  . Number of Children: N/A  . Years of Education: N/A   Occupational History  . Not on file.   Social History Main Topics  . Smoking status: Current Every Day Smoker -- 0.50 packs/day for 51 years    Types: Cigarettes  . Smokeless tobacco: Former Systems developer    Quit date: 11/03/2014     Comment: quit 2 weeks ago  . Alcohol Use: No  . Drug Use: No  . Sexual Activity: No   Other Topics Concern  . Not on file   Social History Narrative   Allergies  Allergen Reactions  . Asa [Aspirin]     angioedema   No family history on file. no family history of rheumatological diseases.  Past medical history, social, surgical and family history all reviewed in electronic medical record.   Review of Systems: No headache, visual changes, nausea, vomiting, diarrhea, constipation, dizziness, abdominal pain, skin rash, fevers, chills, night sweats, weight loss, swollen lymph nodes, body aches, joint swelling, muscle aches, chest pain, shortness of breath, mood changes.   Objective Blood pressure 134/82, pulse 70, height 5\' 9"  (1.753 m), weight 208 lb (94.348 kg), SpO2 98 %.  General: No apparent distress alert and oriented x3 mood and affect normal, dressed appropriately.  HEENT: Pupils equal, extraocular movements intact  Respiratory: Patient's speak in full sentences and does not appear short of breath  Cardiovascular: No lower extremity edema, non  tender, no erythema  Skin: Warm dry intact with no signs of infection or rash on extremities or on axial skeleton.  Abdomen: Soft nontender  Neuro: Cranial nerves II through XII are intact, neurovascularly intact in all extremities with 2+ DTRs and 2+ pulses.  Lymph: No lymphadenopathy of posterior or anterior cervical chain or axillae bilaterally.  Gait normal with good balance and coordination.  MSK:  Non tender with full range of motion and good stability and symmetric strength and tone of  shoulders, elbows, wrist, and ankles bilaterally.  Hip: Right ROM IR: 15 Deg with discomfort in the groin, ER: 45 Deg, Flexion: 100 Deg, Extension: 80 Deg, Abduction: 45 Deg, Adduction: 15 Deg Strength 4 out of 5 strength compared to 5 out of 5 on the contralateral side Pelvic alignment unremarkable to inspection and palpation. Standing hip rotation and gait without trendelenburg sign / unsteadiness. Greater trochanter without tenderness to palpation. No tenderness over piriformis and greater trochanter. Significant pain with internal rotation No SI joint tenderness and normal minimal SI movement. No significant change from previous exam  Shoulder: Right Inspection reveals no abnormalities, atrophy or asymmetry. Palpation is normal with no tenderness over AC joint or bicipital groove. ROM is full in all planes. Does have significant crepitus Rotator cuff strength 4 out of 5 but symmetric to the contralateral side crepitus noted Positive impingement Speeds and Yergason's tests normal. Positive labral pathology. Normal scapular function observed. No painful arc and no drop arm sign. No apprehension sign Contralateral side has further range of motion in the side.    Procedure: Real-time Ultrasound Guided Injection of right hip Device: GE Logiq E  Ultrasound guided injection is preferred based studies that show increased duration, increased effect, greater accuracy, decreased procedural pain, increased response rate with ultrasound guided versus blind injection.  Verbal informed consent obtained.  Time-out conducted.  Noted no overlying erythema, induration, or other signs of local infection.  Skin prepped in a sterile fashion.  Local anesthesia: Topical Ethyl chloride.  With sterile technique and under real time ultrasound guidance:  Anterior capsule visualized, needle visualized going to the head neck junction at the anterior capsule. Pictures taken. Patient did have injection of 3  cc of 1% lidocaine, 3 cc of 0.5% Marcaine, and 1 cc of Kenalog 40 mg/dL. Completed without difficulty  Pain immediately resolved suggesting accurate placement of the medication.  Advised to call if fevers/chills, erythema, induration, drainage, or persistent bleeding.  Images permanently stored and available for review in the ultrasound unit.  Impression: Technically successful ultrasound guided injection.   Procedure: Real-time Ultrasound Guided Injection of right glenohumeral joint Device: GE Logiq E  Ultrasound guided injection is preferred based studies that show increased duration, increased effect, greater accuracy, decreased procedural pain, increased response rate with ultrasound guided versus blind injection.  Verbal informed consent obtained.  Time-out conducted.  Noted no overlying erythema, induration, or other signs of local infection.  Skin prepped in a sterile fashion.  Local anesthesia: Topical Ethyl chloride.  With sterile technique and under real time ultrasound guidance:  Joint visualized.  23g 1  inch needle inserted posterior approach. Pictures taken for needle placement. Patient did have injection of 2 cc of 1% lidocaine, 2 cc of 0.5% Marcaine, and 1.0 cc of Kenalog 40 mg/dL. Completed without difficulty  Pain immediately resolved suggesting accurate placement of the medication.  Advised to call if fevers/chills, erythema, induration, drainage, or persistent bleeding.  Images permanently stored and available for review in the ultrasound unit.  Impression: Technically successful ultrasound guided injection.    Impression and Recommendations:     This case required medical decision making of moderate complexity.

## 2015-06-15 NOTE — Patient Instructions (Addendum)
Good to see you  Ice is your friend Keep taking the vitamins The hip is doing fine and we can repeat injection every 3 months.  If pain comes back before then you may need to consider replacement.  Injected shoulder as well  See me again when you need me.  Need to wait at least 2 weeks until another injection if you want the hand done.

## 2015-06-15 NOTE — Assessment & Plan Note (Signed)
Given an injection today. Tolerated the procedure well. Discussed we can repeat this every 3 months. Moderate to severe arthritic changes noted on x-ray. Patient may need replacement in the future. Patient wants to avoid any type of surgery. We will see him back again more on an as-needed basis. Has pain medications through his primary care physician.

## 2015-06-15 NOTE — Progress Notes (Signed)
Pre visit review using our clinic review tool, if applicable. No additional management support is needed unless otherwise documented below in the visit note. 

## 2015-06-15 NOTE — Assessment & Plan Note (Signed)
Patient given injection today. We discussed icing regimen and home exercises. We discussed which activities to an which was potentially avoid. Patient did have complete resolution of pain with some mild increase in range of motion. Discussed though that this will likely be something that would give him trouble over the course of time as well. Anti-inflammatories is not a possibility secondary to his history of GI bleeds. Not likely going to respond to Tylenol. Patient does have pain medication. Return on an as-needed basis.

## 2015-08-09 ENCOUNTER — Telehealth: Payer: Self-pay | Admitting: Internal Medicine

## 2015-08-09 NOTE — Telephone Encounter (Signed)
Daughter called in said that pts power has been turned off and she needs Dr Waynard Reeds to fax a note over to [facility] fax number [phone] to state that pt has COPD and power cant be turned off

## 2015-08-10 ENCOUNTER — Encounter: Payer: Self-pay | Admitting: Internal Medicine

## 2015-08-10 NOTE — Telephone Encounter (Signed)
LMOVM informing at front

## 2015-08-10 NOTE — Telephone Encounter (Signed)
Letter written

## 2015-08-10 NOTE — Telephone Encounter (Signed)
I do not see on pt problem list. Please advise

## 2015-09-09 ENCOUNTER — Telehealth: Payer: Self-pay | Admitting: Internal Medicine

## 2015-09-09 ENCOUNTER — Other Ambulatory Visit: Payer: Self-pay | Admitting: Internal Medicine

## 2015-09-09 DIAGNOSIS — M159 Polyosteoarthritis, unspecified: Secondary | ICD-10-CM

## 2015-09-09 MED ORDER — OXYCODONE-ACETAMINOPHEN 10-325 MG PO TABS: 1.0000 | ORAL_TABLET | Freq: Three times a day (TID) | ORAL | Status: DC | PRN

## 2015-09-09 NOTE — Telephone Encounter (Signed)
rx printed

## 2015-09-09 NOTE — Telephone Encounter (Signed)
PCP out of office. Please advise.

## 2015-09-09 NOTE — Telephone Encounter (Signed)
Pt request refill for oxyCODONE-acetaminophen (PERCOCET) 10-325 MG tablet, he got 3 pill left. Please call him back

## 2015-09-09 NOTE — Telephone Encounter (Signed)
Pt informed script at front

## 2015-09-16 ENCOUNTER — Telehealth: Payer: Self-pay | Admitting: Geriatric Medicine

## 2015-09-16 NOTE — Telephone Encounter (Signed)
Patient came in on 09/16/15 to pick up his prescription for Oxycodone. He was asked to do a UDS. He came out of the bathroom and I handed him the prescription. When I went into the bathroom the container was empty with only a drop of urine in the cup. It was not enough to perform the drug test. Patient left and we will have to do the drug screen before he picks up his next prescription.

## 2015-10-07 ENCOUNTER — Telehealth: Payer: Self-pay | Admitting: *Deleted

## 2015-10-07 DIAGNOSIS — M159 Polyosteoarthritis, unspecified: Secondary | ICD-10-CM

## 2015-10-07 MED ORDER — OXYCODONE-ACETAMINOPHEN 10-325 MG PO TABS
1.0000 | ORAL_TABLET | Freq: Three times a day (TID) | ORAL | Status: DC | PRN
Start: 2015-10-07 — End: 2015-11-09

## 2015-10-07 NOTE — Telephone Encounter (Signed)
Left msg on triage requesting refgill on pain med "Oxycodone". MD is out of office, but  Pt is not due until 6/5 will forward to MD to address whn he return on Mon...Adrian Miller

## 2015-10-07 NOTE — Telephone Encounter (Signed)
done

## 2015-10-10 NOTE — Telephone Encounter (Signed)
Notified pt rx ready for pick-up.../LMB 

## 2015-10-11 ENCOUNTER — Ambulatory Visit: Payer: Medicare Other | Admitting: Internal Medicine

## 2015-11-09 ENCOUNTER — Telehealth: Payer: Self-pay | Admitting: *Deleted

## 2015-11-09 DIAGNOSIS — M159 Polyosteoarthritis, unspecified: Secondary | ICD-10-CM

## 2015-11-09 MED ORDER — OXYCODONE-ACETAMINOPHEN 10-325 MG PO TABS: 1.0000 | ORAL_TABLET | Freq: Three times a day (TID) | ORAL | Status: DC | PRN

## 2015-11-09 NOTE — Telephone Encounter (Signed)
done

## 2015-11-09 NOTE — Telephone Encounter (Signed)
Notified pt rx ready for pick-up.../lmb 

## 2015-11-09 NOTE — Telephone Encounter (Signed)
Receive call pt requesting refill on his pain med Oxycodone...Adrian Miller

## 2015-12-12 ENCOUNTER — Other Ambulatory Visit: Payer: Self-pay | Admitting: Internal Medicine

## 2015-12-12 ENCOUNTER — Telehealth: Payer: Self-pay

## 2015-12-12 DIAGNOSIS — M159 Polyosteoarthritis, unspecified: Secondary | ICD-10-CM

## 2015-12-12 MED ORDER — OXYCODONE-ACETAMINOPHEN 10-325 MG PO TABS: 1.0000 | ORAL_TABLET | Freq: Three times a day (TID) | ORAL | 0 refills | Status: DC | PRN

## 2015-12-12 NOTE — Telephone Encounter (Signed)
Pt called requesting refill of Oxycodone, please advise. Thanks!

## 2015-12-12 NOTE — Telephone Encounter (Signed)
RX written 

## 2015-12-12 NOTE — Telephone Encounter (Signed)
Pt informed, Rx in cabinet for pt pick up  

## 2015-12-18 ENCOUNTER — Encounter (HOSPITAL_COMMUNITY): Payer: Self-pay | Admitting: Emergency Medicine

## 2015-12-18 ENCOUNTER — Observation Stay (HOSPITAL_COMMUNITY)
Admission: EM | Admit: 2015-12-18 | Discharge: 2015-12-19 | Disposition: A | Discharge status: Home / Self Care | Payer: Medicare Other | Attending: Internal Medicine | Admitting: Internal Medicine

## 2015-12-18 ENCOUNTER — Emergency Department (HOSPITAL_COMMUNITY): Payer: Medicare Other

## 2015-12-18 DIAGNOSIS — R0602 Shortness of breath: Secondary | ICD-10-CM | POA: Insufficient documentation

## 2015-12-18 DIAGNOSIS — I10 Essential (primary) hypertension: Secondary | ICD-10-CM | POA: Diagnosis not present

## 2015-12-18 DIAGNOSIS — F17210 Nicotine dependence, cigarettes, uncomplicated: Secondary | ICD-10-CM | POA: Insufficient documentation

## 2015-12-18 DIAGNOSIS — D509 Iron deficiency anemia, unspecified: Secondary | ICD-10-CM | POA: Insufficient documentation

## 2015-12-18 DIAGNOSIS — R0789 Other chest pain: Principal | ICD-10-CM | POA: Insufficient documentation

## 2015-12-18 DIAGNOSIS — Z79899 Other long term (current) drug therapy: Secondary | ICD-10-CM | POA: Insufficient documentation

## 2015-12-18 DIAGNOSIS — Z716 Tobacco abuse counseling: Secondary | ICD-10-CM

## 2015-12-18 DIAGNOSIS — K219 Gastro-esophageal reflux disease without esophagitis: Secondary | ICD-10-CM | POA: Diagnosis present

## 2015-12-18 DIAGNOSIS — K449 Diaphragmatic hernia without obstruction or gangrene: Secondary | ICD-10-CM

## 2015-12-18 DIAGNOSIS — E1165 Type 2 diabetes mellitus with hyperglycemia: Secondary | ICD-10-CM | POA: Diagnosis not present

## 2015-12-18 DIAGNOSIS — Z96653 Presence of artificial knee joint, bilateral: Secondary | ICD-10-CM | POA: Diagnosis not present

## 2015-12-18 DIAGNOSIS — K222 Esophageal obstruction: Secondary | ICD-10-CM | POA: Diagnosis not present

## 2015-12-18 DIAGNOSIS — R079 Chest pain, unspecified: Secondary | ICD-10-CM | POA: Diagnosis present

## 2015-12-18 DIAGNOSIS — R7303 Prediabetes: Secondary | ICD-10-CM | POA: Diagnosis present

## 2015-12-18 DIAGNOSIS — D519 Vitamin B12 deficiency anemia, unspecified: Secondary | ICD-10-CM | POA: Diagnosis present

## 2015-12-18 LAB — BASIC METABOLIC PANEL
Anion gap: 7 (ref 5–15)
BUN: 14 mg/dL (ref 6–20)
CO2: 25 mmol/L (ref 22–32)
Calcium: 9.1 mg/dL (ref 8.9–10.3)
Chloride: 106 mmol/L (ref 101–111)
Creatinine, Ser: 0.95 mg/dL (ref 0.61–1.24)
GFR calc Af Amer: >60 mL/min (ref >60)
GFR calc non Af Amer: >60 mL/min (ref >60)
Glucose, Bld: 118 mg/dL — ABNORMAL HIGH (ref 65–99)
Potassium: 4.6 mmol/L (ref 3.5–5.1)
Sodium: 138 mmol/L (ref 135–145)

## 2015-12-18 LAB — CBC
HCT: 44.4 % (ref 39.0–52.0)
Hemoglobin: 15.1 g/dL (ref 13.0–17.0)
MCH: 30.6 pg (ref 26.0–34.0)
MCHC: 34 g/dL (ref 30.0–36.0)
MCV: 89.9 fL (ref 78.0–100.0)
Platelets: 387 10*3/uL (ref 150–400)
RBC: 4.94 MIL/uL (ref 4.22–5.81)
RDW: 15.5 % (ref 11.5–15.5)
WBC: 10.8 10*3/uL — ABNORMAL HIGH (ref 4.0–10.5)

## 2015-12-18 LAB — I-STAT TROPONIN, ED: Troponin i, poc: 0 ng/mL (ref 0.00–0.08)

## 2015-12-18 LAB — BRAIN NATRIURETIC PEPTIDE: B Natriuretic Peptide: 20.6 pg/mL (ref 0.0–100.0)

## 2015-12-18 MED ORDER — NITROGLYCERIN 0.4 MG SL SUBL: 0.4000 mg | SUBLINGUAL_TABLET | SUBLINGUAL | Status: DC | PRN

## 2015-12-18 MED ORDER — AMLODIPINE BESYLATE 5 MG PO TABS
5.0000 mg | ORAL_TABLET | Freq: Once | ORAL | Status: AC
  Administered 2015-12-18: 5 mg via ORAL
  Filled 2015-12-18: qty 1

## 2015-12-18 MED ORDER — GI COCKTAIL ~~LOC~~
30.0000 mL | Freq: Once | ORAL | Status: AC
  Administered 2015-12-19: 30 mL via ORAL
  Filled 2015-12-18: qty 30

## 2015-12-18 NOTE — ED Triage Notes (Signed)
Pt presents to the ED with chest pain.  Daughter states pt has been having sharp, stabbing CP since this morning.  Pt reports not the first incidence of CP.  Pt reports SOB but denies nausea or vomiting.  Pt states he does not take BP medications like he should.

## 2015-12-18 NOTE — H&P (Signed)
History and Physical    Adrian Miller [id number] DOB: Aug 28, 1940 DOA: 12/18/2015  PCP: Scarlette Calico, MD   Patient coming from: Home.  Chief Complaint: Chest pain.  HPI: Adrian Miller is a 75 y.o. male with medical history significant of hiatal hernia, esophageal stricture, PUD, essential hypertension who comes emergency department due to sharp, stabbing like chest pain since around 10 AM today.  Per patient, after he woke up around 10 AM, he was planning to to go outside to do some yard work when he developed stabbing like chest pain radiates to both of his shoulders, associated with mild dyspnea, but denies dizziness, nausea, palpitations, diaphoresis. He denies exacerbating or relieving factors. He does states that the chest pain has been present since then , but became worse this afternoon so he decided to come to the emergency department. The patient has not taken his amlodipine in at least 2 weeks for his blood pressure .  ED Course: The patient was in no acute distress. he stated that his chest pain was a lot better. EKG was nonischemic, troponin level #1 and BNP were within normal limits. Chest radiograph shows borderline cardiomegaly.    Review of Systems: As per HPI otherwise 10 point review of systems negative.    Past Medical History:  Diagnosis Date  . Acute esophagitis 06/01/2015  . Benign essential HTN 05/31/2015  . Duodenal ulcer hemorrhage   . Esophageal stricture 06/01/2015  . Hiatal hernia 06/01/2015    Past Surgical History:  Procedure Laterality Date  . ESOPHAGOGASTRODUODENOSCOPY N/A 05/31/2015   Procedure: ESOPHAGOGASTRODUODENOSCOPY (EGD);  Surgeon: Ladene Artist, MD;  Location: [facility];  Service: Endoscopy;  Laterality: N/A;  . HERNIA REPAIR  1975  . LEFT ROTATOR CUFF REPAIR  2002  . REPLACEMENT TOTAL KNEE Left   . TOTAL KNEE ARTHROPLASTY Right 02/22/2015   Procedure: TOTAL KNEE ARTHROPLASTY;  Surgeon: Melrose Nakayama, MD;  Location: [facility];  Service:  Orthopedics;  Laterality: Right;     reports that he has been smoking Cigarettes.  He has a 25.50 pack-year smoking history. He quit smokeless tobacco use about 13 months ago. He reports that he does not drink alcohol or use drugs.  Allergies  Allergen Reactions  . Asa [Aspirin]     angioedema    History reviewed. No pertinent family history.    Prior to Admission medications   Medication Sig Start Date End Date Taking? Authorizing Provider  acetaminophen (TYLENOL) 500 MG tablet Take 1,000 mg by mouth every 8 (eight) hours as needed for mild pain or moderate pain.   Yes Historical Provider, MD  amLODipine (NORVASC) 5 MG tablet TAKE 1 TABLET (5 MG TOTAL) BY MOUTH DAILY. 09/09/15  Yes Janith Lima, MD  cyanocobalamin 2000 MCG tablet Take 1 tablet (2,000 mcg total) by mouth daily. 06/14/15  Yes Janith Lima, MD  ferrous sulfate 325 (65 FE) MG tablet Take 1 tablet (325 mg total) by mouth 3 (three) times daily with meals. Patient taking differently: Take 325 mg by mouth 2 (two) times daily with a meal.  06/14/15  Yes Janith Lima, MD  oxyCODONE-acetaminophen (PERCOCET) 10-325 MG tablet Take 1 tablet by mouth every 8 (eight) hours as needed for pain. 12/12/15  Yes Janith Lima, MD  pantoprazole (PROTONIX) 40 MG tablet Take 1 tablet (40 mg total) by mouth daily. Patient not taking: Reported on 12/18/2015 06/02/15   Venetia Maxon Rama, MD    Physical Exam: Vitals:   12/18/15 2219  12/18/15 2220 12/18/15 2230 12/18/15 2323  BP: 178/100 178/100 (!) 200/101 198/86  Pulse:  71 64 (!) 57  Resp:  18 16 19   Temp:      TempSrc:      SpO2:  95% 94% 96%  Weight:      Height:          Constitutional: NAD, calm, comfortable Vitals:   12/18/15 2219 12/18/15 2220 12/18/15 2230 12/18/15 2323  BP: 178/100 178/100 (!) 200/101 198/86  Pulse:  71 64 (!) 57  Resp:  18 16 19   Temp:      TempSrc:      SpO2:  95% 94% 96%  Weight:      Height:       Eyes: PERRL, lids and conjunctivae normal ENMT:  Mucous membranes are moist. Posterior pharynx clear of any exudate or lesions. Dentition is absent or in poor state of repair. Neck: normal, supple, no masses, no thyromegaly Respiratory: clear to auscultation bilaterally, no wheezing, no crackles. Normal respiratory effort. No accessory muscle use.  Cardiovascular: Regular rate and rhythm, no murmurs / rubs / gallops. No extremity edema. 2+ pedal pulses. No carotid bruits.  Abdomen:  BS positive. Soft, no tenderness, no masses palpated. No hepatosplenomegaly. Musculoskeletal: no clubbing / cyanosis. Mildly ROM of hips and knees due to osteoarthritis, no contractures. Normal muscle tone.  Skin: Few hyperpigmented lesions on back. Neurologic: CN 2-12 grossly intact. Sensation intact, DTR normal. Strength 5/5 in all 4.  Psychiatric: Normal judgment and insight. Alert and oriented x 4. Normal mood.     Labs on Admission: I have personally reviewed following labs and imaging studies  CBC:  Recent Labs Lab 12/18/15 2046  WBC 10.8*  HGB 15.1  HCT 44.4  MCV 89.9  PLT [id number]   Basic Metabolic Panel:  Recent Labs Lab 12/18/15 2046  NA 138  K 4.6  CL 106  CO2 25  GLUCOSE 118*  BUN 14  CREATININE 0.95  CALCIUM 9.1   GFR: Estimated Creatinine Clearance: 80.2 mL/min (by C-G formula based on SCr of 0.95 mg/dL).  Urine analysis:    Component Value Date/Time   COLORURINE YELLOW 05/30/2015 [place] 05/30/2015 1614   LABSPEC 1.026 05/30/2015 1614   PHURINE 5.0 05/30/2015 1614   GLUCOSEU NEGATIVE 05/30/2015 1614   GLUCOSEU NEGATIVE 03/18/2014 1621   HGBUR NEGATIVE 05/30/2015 1614   BILIRUBINUR NEGATIVE 05/30/2015 1614   BILIRUBINUR small 02/05/2012 1931   KETONESUR NEGATIVE 05/30/2015 1614   PROTEINUR NEGATIVE 05/30/2015 1614   UROBILINOGEN 0.2 02/11/2015 0929   NITRITE NEGATIVE 05/30/2015 1614   LEUKOCYTESUR NEGATIVE 05/30/2015 1614    Radiological Exams on Admission: Dg Chest 2 View  Result Date:  12/18/2015 CLINICAL DATA:  75 year old male with acute chest pain since this morning. Shortness of breath. Initial encounter. EXAM: CHEST  2 VIEW COMPARISON:  02/11/2015 and earlier. FINDINGS: Borderline to mild cardiomegaly. Other mediastinal contours are within normal limits. Visualized tracheal air column is within normal limits. No pneumothorax, pulmonary edema, pleural effusion or confluent pulmonary opacity. No acute osseous abnormality identified. IMPRESSION: Cardiac size at the upper limits of normal to mildly enlarged. No acute cardiopulmonary abnormality. Electronically Signed   By: Genevie Ann M.D.   On: 12/18/2015 21:01   Echocardiogram 06/01/2015 ------------------------------------------------------------------- LV EF: 60% -   65%  ------------------------------------------------------------------- Indications:      Chest pain 786.51.  ------------------------------------------------------------------- History:   PMH:  Shortness of Breath.  Chronic obstructive pulmonary disease.  Risk factors:  Diabetes mellitus.  ------------------------------------------------------------------- Study Conclusions  - Left ventricle: The cavity size was normal. Wall thickness was   increased in a pattern of mild LVH. Systolic function was normal.   The estimated ejection fraction was in the range of 60% to 65%.   Wall motion was normal; there were no regional wall motion   abnormalities. Features are consistent with a pseudonormal left   ventricular filling pattern, with concomitant abnormal relaxation   and increased filling pressure (grade 2 diastolic dysfunction).  EKG: Independently reviewed.  Vent. rate 65 BPM PR interval * ms QRS duration 88 ms QT/QTc 394/410 ms P-R-T axes 51 49 141 Sinus arrhythmia Ventricular premature complex Abnrm T, consider ischemia, anterolateral lds  Assessment/Plan Principal Problem:   Chest pain Admit to telemetry/observation. Continue supplemental  oxygen. Trend troponin level. Blood pressure control. Consider inpatient or outpatient stress testing.  Active Problems:   Benign essential HTN Resume amlodipine 5 mg by mouth daily. Patient advised to keep blood pressure control to avoid cardiovascular complications like heart disease or stroke. Smoking cessation advised. Moderation with caffeine intake advised. Monitor blood pressure closely and adjust therapy as needed.    Hiatal hernia   Esophageal stricture Resume pantoprazole 40 mg by mouth daily.    Hyperglycemia Check fasting blood glucose. Check hemoglobin A1c.    B12 deficiency anemia history. H&H is normal now. Continues B12 supplementation.    MRSA carrier history Check MRSA by PCR and start contact precautions/ decontamination therapy if needed.    DVT prophylaxis: Lovenox. Code Status: Full code. Family Communication:  Disposition Plan: Admitted for cardiac telemetry monitoring and troponin cycle trending. Consults called:  Admission status: Observation/telemetry   Reubin Milan MD [facility] Pager [phone].  If 7PM-7AM, please contact night-coverage [email or web address] Password [facility]  12/18/2015, 11:47 PM

## 2015-12-19 ENCOUNTER — Encounter (HOSPITAL_COMMUNITY): Payer: Self-pay | Admitting: Internal Medicine

## 2015-12-19 DIAGNOSIS — Z716 Tobacco abuse counseling: Secondary | ICD-10-CM | POA: Diagnosis not present

## 2015-12-19 DIAGNOSIS — K219 Gastro-esophageal reflux disease without esophagitis: Secondary | ICD-10-CM

## 2015-12-19 DIAGNOSIS — K222 Esophageal obstruction: Secondary | ICD-10-CM | POA: Diagnosis not present

## 2015-12-19 DIAGNOSIS — R072 Precordial pain: Secondary | ICD-10-CM

## 2015-12-19 DIAGNOSIS — I10 Essential (primary) hypertension: Secondary | ICD-10-CM

## 2015-12-19 LAB — CBC WITH DIFFERENTIAL/PLATELET
Basophils Absolute: 0 10*3/uL (ref 0.0–0.1)
Basophils Relative: 0 %
Eosinophils Absolute: 0 10*3/uL (ref 0.0–0.7)
Eosinophils Relative: 0 %
HCT: 42.5 % (ref 39.0–52.0)
Hemoglobin: 14.4 g/dL (ref 13.0–17.0)
Lymphocytes Relative: 17 %
Lymphs Abs: 1.8 10*3/uL (ref 0.7–4.0)
MCH: 30.4 pg (ref 26.0–34.0)
MCHC: 33.9 g/dL (ref 30.0–36.0)
MCV: 89.7 fL (ref 78.0–100.0)
Monocytes Absolute: 0.5 10*3/uL (ref 0.1–1.0)
Monocytes Relative: 5 %
Neutro Abs: 8.3 10*3/uL — ABNORMAL HIGH (ref 1.7–7.7)
Neutrophils Relative %: 78 %
Platelets: 325 10*3/uL (ref 150–400)
RBC: 4.74 MIL/uL (ref 4.22–5.81)
RDW: 15.2 % (ref 11.5–15.5)
WBC: 10.7 10*3/uL — ABNORMAL HIGH (ref 4.0–10.5)

## 2015-12-19 LAB — CBC
HCT: 44.1 % (ref 39.0–52.0)
Hemoglobin: 15 g/dL (ref 13.0–17.0)
MCH: 30.4 pg (ref 26.0–34.0)
MCHC: 34 g/dL (ref 30.0–36.0)
MCV: 89.3 fL (ref 78.0–100.0)
Platelets: 336 10*3/uL (ref 150–400)
RBC: 4.94 MIL/uL (ref 4.22–5.81)
RDW: 15.2 % (ref 11.5–15.5)
WBC: 11.3 10*3/uL — ABNORMAL HIGH (ref 4.0–10.5)

## 2015-12-19 LAB — COMPREHENSIVE METABOLIC PANEL
ALT: 188 U/L — ABNORMAL HIGH (ref 17–63)
AST: 166 U/L — ABNORMAL HIGH (ref 15–41)
Albumin: 4 g/dL (ref 3.5–5.0)
Alkaline Phosphatase: 139 U/L — ABNORMAL HIGH (ref 38–126)
Anion gap: 6 (ref 5–15)
BUN: 13 mg/dL (ref 6–20)
CO2: 22 mmol/L (ref 22–32)
Calcium: 9.3 mg/dL (ref 8.9–10.3)
Chloride: 110 mmol/L (ref 101–111)
Creatinine, Ser: 0.97 mg/dL (ref 0.61–1.24)
GFR calc Af Amer: >60 mL/min (ref >60)
GFR calc non Af Amer: >60 mL/min (ref >60)
Glucose, Bld: 151 mg/dL — ABNORMAL HIGH (ref 65–99)
Potassium: 4 mmol/L (ref 3.5–5.1)
Sodium: 138 mmol/L (ref 135–145)
Total Bilirubin: 0.6 mg/dL (ref 0.3–1.2)
Total Protein: 7.6 g/dL (ref 6.5–8.1)

## 2015-12-19 LAB — CREATININE, SERUM
Creatinine, Ser: 0.97 mg/dL (ref 0.61–1.24)
GFR calc Af Amer: >60 mL/min (ref >60)
GFR calc non Af Amer: >60 mL/min (ref >60)

## 2015-12-19 LAB — TROPONIN I
Troponin I: <0.03 ng/mL (ref <0.03)
Troponin I: <0.03 ng/mL (ref <0.03)

## 2015-12-19 LAB — MRSA PCR SCREENING: MRSA by PCR: INVALID — AB

## 2015-12-19 MED ORDER — HYDRALAZINE HCL 20 MG/ML IJ SOLN
5.0000 mg | Freq: Three times a day (TID) | INTRAMUSCULAR | Status: DC | PRN
  Administered 2015-12-19: 10 mg via INTRAVENOUS
  Filled 2015-12-19: qty 1

## 2015-12-19 MED ORDER — PANTOPRAZOLE SODIUM 40 MG PO TBEC: 40.0000 mg | DELAYED_RELEASE_TABLET | Freq: Every day | ORAL | 0 refills | Status: DC

## 2015-12-19 MED ORDER — PNEUMOCOCCAL VAC POLYVALENT 25 MCG/0.5ML IJ INJ: 0.5000 mL | INJECTION | INTRAMUSCULAR | Status: DC

## 2015-12-19 MED ORDER — ONDANSETRON HCL 4 MG/2ML IJ SOLN: 4.0000 mg | Freq: Four times a day (QID) | INTRAMUSCULAR | Status: DC | PRN

## 2015-12-19 MED ORDER — ENOXAPARIN SODIUM 40 MG/0.4ML ~~LOC~~ SOLN
40.0000 mg | SUBCUTANEOUS | Status: DC
  Administered 2015-12-19: 40 mg via SUBCUTANEOUS
  Filled 2015-12-19: qty 0.4

## 2015-12-19 MED ORDER — PANTOPRAZOLE SODIUM 40 MG PO TBEC
40.0000 mg | DELAYED_RELEASE_TABLET | Freq: Every day | ORAL | Status: DC
  Administered 2015-12-19 (×2): 40 mg via ORAL
  Filled 2015-12-19 (×2): qty 1

## 2015-12-19 MED ORDER — OXYCODONE-ACETAMINOPHEN 5-325 MG PO TABS
1.0000 | ORAL_TABLET | Freq: Three times a day (TID) | ORAL | Status: DC | PRN
  Administered 2015-12-19: 1 via ORAL
  Filled 2015-12-19: qty 1

## 2015-12-19 MED ORDER — OXYCODONE HCL 5 MG PO TABS
5.0000 mg | ORAL_TABLET | Freq: Three times a day (TID) | ORAL | Status: DC | PRN
  Administered 2015-12-19: 5 mg via ORAL
  Filled 2015-12-19: qty 1

## 2015-12-19 MED ORDER — AMLODIPINE BESYLATE 5 MG PO TABS
5.0000 mg | ORAL_TABLET | Freq: Every day | ORAL | Status: DC
  Administered 2015-12-19: 5 mg via ORAL
  Filled 2015-12-19: qty 1

## 2015-12-19 MED ORDER — ACETAMINOPHEN 325 MG PO TABS: 650.0000 mg | ORAL_TABLET | Freq: Three times a day (TID) | ORAL | Status: DC | PRN

## 2015-12-19 MED ORDER — FERROUS SULFATE 325 (65 FE) MG PO TABS
325.0000 mg | ORAL_TABLET | Freq: Two times a day (BID) | ORAL | Status: DC
  Administered 2015-12-19: 325 mg via ORAL
  Filled 2015-12-19: qty 1

## 2015-12-19 MED ORDER — NICOTINE 21 MG/24HR TD PT24: 21.0000 mg | MEDICATED_PATCH | Freq: Every day | TRANSDERMAL | 0 refills | Status: DC

## 2015-12-19 MED ORDER — OXYCODONE-ACETAMINOPHEN 10-325 MG PO TABS: 1.0000 | ORAL_TABLET | Freq: Three times a day (TID) | ORAL | Status: DC | PRN

## 2015-12-19 MED ORDER — SODIUM CHLORIDE 0.9% FLUSH
3.0000 mL | Freq: Two times a day (BID) | INTRAVENOUS | Status: DC
  Administered 2015-12-19 (×2): 3 mL via INTRAVENOUS

## 2015-12-19 MED ORDER — ALPRAZOLAM 0.25 MG PO TABS: 0.2500 mg | ORAL_TABLET | Freq: Two times a day (BID) | ORAL | Status: DC | PRN

## 2015-12-19 MED ORDER — VITAMIN B-12 1000 MCG PO TABS
2000.0000 ug | ORAL_TABLET | Freq: Every day | ORAL | Status: DC
  Administered 2015-12-19: 2000 ug via ORAL
  Filled 2015-12-19: qty 2

## 2015-12-19 MED ORDER — AMLODIPINE BESYLATE 5 MG PO TABS: 5.0000 mg | ORAL_TABLET | Freq: Every day | ORAL | 0 refills | Status: DC

## 2015-12-19 MED ORDER — NICOTINE 21 MG/24HR TD PT24
21.0000 mg | MEDICATED_PATCH | Freq: Every day | TRANSDERMAL | Status: DC
  Administered 2015-12-19: 21 mg via TRANSDERMAL
  Filled 2015-12-19: qty 1

## 2015-12-19 NOTE — Progress Notes (Deleted)
Spoke with pt concerning discharge needs. Pt declined [place] at present time.

## 2015-12-19 NOTE — Discharge Instructions (Addendum)
Tobacco Use Disorder Tobacco use disorder (TUD) is a mental disorder. It is the long-term use of tobacco in spite of related health problems or difficulty with normal life activities. Tobacco is most commonly smoked as cigarettes and less commonly as cigars or pipes. Smokeless chewing tobacco and snuff are also popular. People with TUD get a feeling of extreme pleasure (euphoria) from using tobacco and have a desire to use it again and again. Repeated use of tobacco can cause problems. The addictive effects of tobacco are due mainly tothe ingredient nicotine. Nicotine also causes a rush of adrenaline (epinephrine) in the body. This leads to increased blood pressure, heart rate, and breathing rate. These changes may cause problems for people with high blood pressure, weak hearts, or lung disease. High doses of nicotine in children and pets can lead to seizures and death.  Tobacco contains a number of other unsafe chemicals. These chemicals are especially harmful when inhaled as smoke and can damage almost every organ in the body. Smokers live shorter lives than nonsmokers and are at risk of dying from a number of diseases and cancers. Tobacco smoke can also cause health problems for nonsmokers (due to inhaling secondhand smoke). Smoking is also a fire hazard.  TUD usually starts in the late teenage years and is most common in young adults between the ages of 18 and 25 years. People who start smoking earlier in life are more likely to continue smoking as adults. TUD is somewhat more common in men than women. People with TUD are at higher risk for using alcohol and other drugs of abuse. RISK FACTORS Risk factors for TUD include:   Having family members with the disorder.  Being around people who use tobacco.  Having an existing mental health issue such as schizophrenia, depression, bipolar disorder, ADHD, or posttraumatic stress disorder (PTSD). SIGNS AND SYMPTOMS  People with tobacco use disorder have  two or more of the following signs and symptoms within 12 months:   Use of more tobacco over a longer period than intended.   Not able to cut down or control tobacco use.   A lot of time spent obtaining or using tobacco.   Strong desire or urge to use tobacco (craving). Cravings may last for 6 months or longer after quitting.  Use of tobacco even when use leads to major problems at work, school, or home.   Use of tobacco even when use leads to relationship problems.   Giving up or cutting down on important life activities because of tobacco use.   Repeatedly using tobacco in situations where it puts you or others in physical danger, like smoking in bed.   Use of tobacco even when it is known that a physical or mental problem is likely related to tobacco use.   Physical problems are numerous and may include chronic bronchitis, emphysema, lung and other cancers, gum disease, high blood pressure, heart disease, and stroke.   Mental problems caused by tobacco may include difficulty sleeping and anxiety.  Need to use greater amounts of tobacco to get the same effect. This means you have developed a tolerance.   Withdrawal symptoms as a result of stopping or rapidly cutting back use. These symptoms may last a month or more after quitting and include the following:   Depressed, anxious, or irritable mood.   Difficulty concentrating.   Increased appetite.  Restlessness or trouble sleeping.   Use of tobacco to avoid withdrawal symptoms. DIAGNOSIS  Tobacco use disorder is diagnosed by   your health care provider. A diagnosis may be made by:  Your health care provider asking questions about your tobacco use and any problems it may be causing.  A physical exam.  Lab tests.  You may be referred to a mental health professional or addiction specialist. The severity of tobacco use disorder depends on the number of signs and symptoms you have:   Mild--Two or three  symptoms.  Moderate--Four or five symptoms.   Severe--Six or more symptoms.  TREATMENT  Many people with tobacco use disorder are unable to quit on their own and need help. Treatment options include the following:  Nicotine replacement therapy (NRT). NRT provides nicotine without the other harmful chemicals in tobacco. NRT gradually lowers the dosage of nicotine in the body and reduces withdrawal symptoms. NRT is available in over-the-counter forms (gum, lozenges, and skin patches) as well as prescription forms (mouth inhaler and nasal spray).  Medicines.This may include:  Antidepressant medicine that may reduce nicotine cravings.  A medicine that acts on nicotine receptors in the brain to reduce cravings and withdrawal symptoms. It may also block the effects of tobacco in people with TUD who relapse.  Counseling or talk therapy. A form of talk therapy called behavioral therapy is commonly used to treat people with TUD. Behavioral therapy looks at triggers for tobacco use, how to avoid them, and how to cope with cravings. It is most effective in person or by phone but is also available in self-help forms (books and Internet websites).  Support groups. These provide emotional support, advice, and guidance for quitting tobacco. The most effective treatment for TUD is usually a combination of medicine, talk therapy, and support groups. HOME CARE INSTRUCTIONS  Keep all follow-up visits as directed by your health care provider. This is important.  Take medicines only as directed by your health care provider.  Check with your health care provider before starting new prescription or over-the-counter medicines. SEEK MEDICAL CARE IF:  You are not able to take your medicines as prescribed.  Treatment is not helping your TUD and your symptoms get worse. SEEK IMMEDIATE MEDICAL CARE IF:  You have serious thoughts about hurting yourself or others.  You have trouble breathing, chest pain,  sudden weakness, or sudden numbness in part of your body.   This information is not intended to replace advice given to you by your health care provider. Make sure you discuss any questions you have with your health care provider.   Document Released: 12/28/2003 Document Revised: 05/14/2014 Document Reviewed: 06/19/2013 Elsevier Interactive Patient Education 2016 [place] for Gastroesophageal Reflux Disease, Adult When you have gastroesophageal reflux disease (GERD), the foods you eat and your eating habits are very important. Choosing the right foods can help ease the discomfort of GERD. WHAT GENERAL GUIDELINES DO I NEED TO FOLLOW?  Choose fruits, vegetables, whole grains, low-fat dairy products, and low-fat meat, fish, and poultry.  Limit fats such as oils, salad dressings, butter, nuts, and avocado.  Keep a food diary to identify foods that cause symptoms.  Avoid foods that cause reflux. These may be different for different people.  Eat frequent small meals instead of three large meals each day.  Eat your meals slowly, in a relaxed setting.  Limit fried foods.  Cook foods using methods other than frying.  Avoid drinking alcohol.  Avoid drinking large amounts of liquids with your meals.  Avoid bending over or lying down until 2-3 hours after eating. WHAT FOODS ARE NOT  RECOMMENDED? The following are some foods and drinks that may worsen your symptoms: Vegetables Tomatoes. Tomato juice. Tomato and spaghetti sauce. Chili peppers. Onion and garlic. Horseradish. Fruits Oranges, grapefruit, and lemon (fruit and juice). Meats High-fat meats, fish, and poultry. This includes hot dogs, ribs, ham, sausage, salami, and bacon. Dairy Whole milk and chocolate milk. Sour cream. Cream. Butter. Ice cream. Cream cheese.  Beverages Coffee and tea, with or without caffeine. Carbonated beverages or energy drinks. Condiments Hot sauce. Barbecue sauce.   Sweets/Desserts Chocolate and cocoa. Donuts. Peppermint and spearmint. Fats and Oils High-fat foods, including [place] fries and potato chips. Other Vinegar. Strong spices, such as black pepper, white pepper, red pepper, cayenne, curry powder, cloves, ginger, and chili powder. The items listed above may not be a complete list of foods and beverages to avoid. Contact your dietitian for more information.   This information is not intended to replace advice given to you by your health care provider. Make sure you discuss any questions you have with your health care provider.   Document Released: 04/23/2005 Document Revised: 05/14/2014 Document Reviewed: 02/25/2013 Elsevier Interactive Patient Education [employer].

## 2015-12-19 NOTE — Discharge Summary (Signed)
Physician Discharge Summary  Adrian Miller [id number] DOB: [birth year] DOA: 12/18/2015  PCP: Scarlette Calico, MD  Admit date: 12/18/2015 Discharge date: 12/19/2015  Admitted From: Home Disposition:  Home  Recommendations for Outpatient Follow-up:  Follow up with PCP in 1-2 weeks   Home Health: No Equipment/Devices: None  Discharge Condition: Stable CODE STATUS: Full code Diet recommendation: Low sodium    Discharge Diagnoses:  Principal Problem:   Chest pain, atypical   Active Problems:   Hiatal hernia   Esophageal stricture   Hyperglycemia   B12 deficiency anemia   GERD (gastroesophageal reflux disease)   Uncontrolled hypertension   Tobacco abuse counseling   Brief narrative/history of present illness 75 year old male with ongoing heavy tobacco use, hiatal hernia, history of esophageal stricture, GERD, hypertension, history of GI bleed early this year presented with substernal chest pain radiating to the shoulder. Patient reports that his symptoms are similar to the gastritis but was more intense. Symptoms started when he was lying down, 3/-4/10 in severity without any aggravating or relieving factors. He reports some belching associated with it. Denies Nausea, vomiting, shortness of breath, abdominal pain, headache, dizziness, fever, chills, bowel or urinary symptoms. Denies use of NSAIDs but reports taking BC powder frequently for his right hip pain. Patient had elevated blood pressure on presentation. Blood work showed mild leukocytosis, normal hemoglobin and electrolytes. Initial troponin was negative. EKG showed normal sinus rhythm with T-wave inversion in lateral leads (unchanged from prior). Chest x-ray unremarkable. Patient admitted for further workup.  Hospital course Chest pain Appears to be atypical and likely associated with GERD. Patient continues to smoke heavily and also using BC powder for his hip pain. Stable on telemetry overnight. Serial troponins were  negative. Patient remained chest pain-free at this morning. Counseled on quitting smoking and avoiding NSAIDs/BC powder. 2-D echo done earlier this year with normal EF and no wall motion abnormality. I will prescribe him a course of PPI. Stable to be discharged home with outpatient follow-up.  Uncontrolled hypertension Patient has been off his blood pressure medication (amlodipine) for almost a week and has not been able to refill them. I have prescribed him a one-month supply. Counseled on diet adherence and medication compliance.  Iron deficiency anemia Continue iron supplement.  GERD with hiatal hernia Prescribed PPI  Tobacco abuse Counseled on cessation.. Prescribe nicotine patch.  Patient stable to be discharged home with outpatient follow-up.  Discharge Instructions     Medication List    TAKE these medications   acetaminophen 500 MG tablet Commonly known as:  TYLENOL Take 1,000 mg by mouth every 8 (eight) hours as needed for mild pain or moderate pain.   amLODipine 5 MG tablet Commonly known as:  NORVASC Take 1 tablet (5 mg total) by mouth daily. What changed:  See the new instructions.   cyanocobalamin 2000 MCG tablet Take 1 tablet (2,000 mcg total) by mouth daily.   ferrous sulfate 325 (65 FE) MG tablet Take 1 tablet (325 mg total) by mouth 3 (three) times daily with meals. What changed:  when to take this   nicotine 21 mg/24hr patch Commonly known as:  NICODERM CQ - dosed in mg/24 hours Place 1 patch (21 mg total) onto the skin daily.   oxyCODONE-acetaminophen 10-325 MG tablet Commonly known as:  PERCOCET Take 1 tablet by mouth every 8 (eight) hours as needed for pain.   pantoprazole 40 MG tablet Commonly known as:  PROTONIX Take 1 tablet (40 mg total) by mouth daily.  Follow-up Information    Scarlette Calico, MD. Schedule an appointment as soon as possible for a visit in 1 week(s).   Specialty:  Internal Medicine Contact information: [address]  [place] [address] [phone]          Allergies  Allergen Reactions  . Asa [Aspirin]     angioedema      Procedures/Studies: Dg Chest 2 View  Result Date: 12/18/2015 CLINICAL DATA:  75 year old male with acute chest pain since this morning. Shortness of breath. Initial encounter. EXAM: CHEST  2 VIEW COMPARISON:  02/11/2015 and earlier. FINDINGS: Borderline to mild cardiomegaly. Other mediastinal contours are within normal limits. Visualized tracheal air column is within normal limits. No pneumothorax, pulmonary edema, pleural effusion or confluent pulmonary opacity. No acute osseous abnormality identified. IMPRESSION: Cardiac size at the upper limits of normal to mildly enlarged. No acute cardiopulmonary abnormality. Electronically Signed   By: Genevie Ann M.D.   On: 12/18/2015 21:01       Subjective: No further symptoms. Stable on monitor  Discharge Exam: Vitals:   12/19/15 0111 12/19/15 0608  BP: (!) 217/96 (!) 164/81  Pulse: 67 61  Resp: 20 19  Temp: 98.3 F (36.8 C) 98.7 F (37.1 C)   Vitals:   12/18/15 2358 12/19/15 0030 12/19/15 0111 12/19/15 0608  BP: 200/95 173/77 (!) 217/96 (!) 164/81  Pulse: 67 61 67 61  Resp: 17 16 20 19   Temp:   98.3 F (36.8 C) 98.7 F (37.1 C)  TempSrc:   Oral Oral  SpO2: 96% 97% 98% 94%  Weight:   96.4 kg (212 lb 8.4 oz) 96.4 kg (212 lb 8.4 oz)  Height:   5\' 9"  (1.753 m)     General: Elderly male not in distress  HEENT: No Pallor, Moist Mucosa, Supple Neck Cardiovascular: RRR, S1/S2 +, no rubs, no gallops Respiratory: CTA bilaterally, no wheezing, no rhonchi Abdominal: Soft, NT, ND, bowel sounds + Extremities: Warm, no edema    The results of significant diagnostics from this hospitalization (including imaging, microbiology, ancillary and laboratory) are listed below for reference.     Microbiology: Recent Results (from the past 240 hour(s))  MRSA PCR Screening     Status: Abnormal   Collection  Time: 12/19/15  1:30 AM  Result Value Ref Range Status   MRSA by PCR INVALID RESULTS, SPECIMEN SENT FOR CULTURE (A) NEGATIVE Final    Comment: RESULT CALLED TO, READ BACK BY AND VERIFIED WITH: D.BROWN,RN AT [id number] ON 12/19/15 BY W.SHEA        The GeneXpert MRSA Assay (FDA approved for NASAL specimens only), is one component of a comprehensive MRSA colonization surveillance program. It is not intended to diagnose MRSA infection nor to guide or monitor treatment for MRSA infections.      Labs: BNP (last 3 results)  Recent Labs  12/18/15 2035  BNP [id number]   Basic Metabolic Panel:  Recent Labs Lab 12/18/15 2046 12/19/15 0247 12/19/15 0755  NA 138  --  138  K 4.6  --  4.0  CL 106  --  110  CO2 25  --  22  GLUCOSE 118*  --  151*  BUN 14  --  13  CREATININE 0.95 0.97 0.97  CALCIUM 9.1  --  9.3   Liver Function Tests:  Recent Labs Lab 12/19/15 0755  AST 166*  ALT 188*  ALKPHOS 139*  BILITOT 0.6  PROT 7.6  ALBUMIN 4.0   No results for input(s):  LIPASE, AMYLASE in the last 168 hours. No results for input(s): AMMONIA in the last 168 hours. CBC:  Recent Labs Lab 12/18/15 2046 12/19/15 0247 12/19/15 0755  WBC 10.8* 11.3* 10.7*  NEUTROABS  --   --  8.3*  HGB 15.1 15.0 14.4  HCT 44.4 44.1 42.5  MCV 89.9 89.3 89.7  PLT 387 336 325   Cardiac Enzymes:  Recent Labs Lab 12/19/15 0247 12/19/15 0755  TROPONINI <0.03 <0.03   BNP: Invalid input(s): POCBNP CBG: No results for input(s): GLUCAP in the last 168 hours. D-Dimer No results for input(s): DDIMER in the last 72 hours. Hgb A1c No results for input(s): HGBA1C in the last 72 hours. Lipid Profile No results for input(s): CHOL, HDL, LDLCALC, TRIG, CHOLHDL, LDLDIRECT in the last 72 hours. Thyroid function studies No results for input(s): TSH, T4TOTAL, T3FREE, THYROIDAB in the last 72 hours.  Invalid input(s): FREET3 Anemia work up No results for input(s): VITAMINB12, FOLATE, FERRITIN, TIBC, IRON,  RETICCTPCT in the last 72 hours. Urinalysis    Component Value Date/Time   COLORURINE YELLOW 05/30/2015 [place] 05/30/2015 1614   LABSPEC 1.026 05/30/2015 1614   PHURINE 5.0 05/30/2015 1614   GLUCOSEU NEGATIVE 05/30/2015 1614   GLUCOSEU NEGATIVE 03/18/2014 1621   HGBUR NEGATIVE 05/30/2015 1614   BILIRUBINUR NEGATIVE 05/30/2015 1614   BILIRUBINUR small 02/05/2012 1931   KETONESUR NEGATIVE 05/30/2015 1614   PROTEINUR NEGATIVE 05/30/2015 1614   UROBILINOGEN 0.2 02/11/2015 0929   NITRITE NEGATIVE 05/30/2015 1614   LEUKOCYTESUR NEGATIVE 05/30/2015 1614   Sepsis Labs Invalid input(s): PROCALCITONIN,  WBC,  LACTICIDVEN Microbiology Recent Results (from the past 240 hour(s))  MRSA PCR Screening     Status: Abnormal   Collection Time: 12/19/15  1:30 AM  Result Value Ref Range Status   MRSA by PCR INVALID RESULTS, SPECIMEN SENT FOR CULTURE (A) NEGATIVE Final    Comment: RESULT CALLED TO, READ BACK BY AND VERIFIED WITH: D.BROWN,RN AT [id number] ON 12/19/15 BY W.SHEA        The GeneXpert MRSA Assay (FDA approved for NASAL specimens only), is one component of a comprehensive MRSA colonization surveillance program. It is not intended to diagnose MRSA infection nor to guide or monitor treatment for MRSA infections.      Time coordinating discharge: <30 minutes  SIGNED:   Louellen Molder, MD  [facility] 12/19/2015, 10:57 AM Pager   If 7PM-7AM, please contact night-coverage [email or web address] Password [email or web address]

## 2015-12-19 NOTE — ED Provider Notes (Signed)
Adrian Miller Provider Note   CSN: [id number] Arrival date & time: 12/18/15  1958  First Provider Contact:  None       History   Chief Complaint Chief Complaint  Patient presents with  . Chest Pain  . Shortness of Breath    HPI Adrian Miller is a 75 y.o. male.  HPI   75 year old male with past medical history of duodenal ulcers, hypertension and hiatal hernia who presents with worsening hypertension and chest pain. The patient is a difficult historian, but states that his blood pressure has been increasing over the last several weeks. He believes he ran out of his blood pressure medication over the last 24 hours. The patient is had some general increase in his fatigue. He reports mild lightheadedness upon standing over the last 24 hours earlier today, the patient began to develop sharp, but also pressure-like substernal chest pressure. He had some associated shortness of breath. This was associated with exertion but also occurred at rest. Denies any alleviating factors. He subsequently presents for evaluation. The pain does not radiate. Denies any associated diaphoresis or nausea. Pain is different from his ulcer pain  Past Medical History:  Diagnosis Date  . Acute esophagitis 06/01/2015  . Benign essential HTN 05/31/2015  . Duodenal ulcer hemorrhage   . Esophageal stricture 06/01/2015  . Hiatal hernia 06/01/2015    Patient Active Problem List   Diagnosis Date Noted  . Chest pain 12/18/2015  . Arthritis of right hip 06/15/2015  . Rotator cuff tear arthropathy of right shoulder 06/15/2015  . Pernicious anemia 06/14/2015  . Hyperglycemia 06/14/2015  . Hip pain, acute 06/14/2015  . Generalized OA 06/14/2015  . B12 deficiency anemia 06/14/2015  . Other iron deficiency anemias 06/14/2015  . Hiatal hernia 06/01/2015  . Acute esophagitis 06/01/2015  . Esophageal stricture 06/01/2015  . Benign essential HTN 05/31/2015  . Duodenal ulcer hemorrhage     Past Surgical  History:  Procedure Laterality Date  . ESOPHAGOGASTRODUODENOSCOPY N/A 05/31/2015   Procedure: ESOPHAGOGASTRODUODENOSCOPY (EGD);  Surgeon: Ladene Artist, MD;  Location: [facility];  Service: Endoscopy;  Laterality: N/A;  . HERNIA REPAIR  1975  . LEFT ROTATOR CUFF REPAIR  2002  . REPLACEMENT TOTAL KNEE Left   . TOTAL KNEE ARTHROPLASTY Right 02/22/2015   Procedure: TOTAL KNEE ARTHROPLASTY;  Surgeon: Melrose Nakayama, MD;  Location: [facility];  Service: Orthopedics;  Laterality: Right;       Home Medications    Prior to Admission medications   Medication Sig Start Date End Date Taking? Authorizing Provider  acetaminophen (TYLENOL) 500 MG tablet Take 1,000 mg by mouth every 8 (eight) hours as needed for mild pain or moderate pain.   Yes Historical Provider, MD  amLODipine (NORVASC) 5 MG tablet TAKE 1 TABLET (5 MG TOTAL) BY MOUTH DAILY. 09/09/15  Yes Janith Lima, MD  cyanocobalamin 2000 MCG tablet Take 1 tablet (2,000 mcg total) by mouth daily. 06/14/15  Yes Janith Lima, MD  ferrous sulfate 325 (65 FE) MG tablet Take 1 tablet (325 mg total) by mouth 3 (three) times daily with meals. Patient taking differently: Take 325 mg by mouth 2 (two) times daily with a meal.  06/14/15  Yes Janith Lima, MD  oxyCODONE-acetaminophen (PERCOCET) 10-325 MG tablet Take 1 tablet by mouth every 8 (eight) hours as needed for pain. 12/12/15  Yes Janith Lima, MD  pantoprazole (PROTONIX) 40 MG tablet Take 1 tablet (40 mg total) by mouth daily. Patient not taking: Reported  on 12/18/2015 06/02/15   Venetia Maxon Rama, MD    Family History Family History  Problem Relation Age of Onset  . Hypertension Mother   . Heart attack Sister     Social History Social History  Substance Use Topics  . Smoking status: Current Every Day Smoker    Packs/day: 0.50    Years: 51.00    Types: Cigarettes  . Smokeless tobacco: Former Systems developer    Quit date: 11/03/2014     Comment: quit 2 weeks ago  . Alcohol use No     Allergies     Asa [aspirin]   Review of Systems Review of Systems  Constitutional: Negative for chills, fatigue and fever.  HENT: Negative for congestion and rhinorrhea.   Eyes: Negative for visual disturbance.  Respiratory: Positive for chest tightness and shortness of breath. Negative for cough and wheezing.   Cardiovascular: Positive for chest pain. Negative for leg swelling.  Gastrointestinal: Negative for abdominal pain, diarrhea, nausea and vomiting.  Genitourinary: Negative for dysuria and flank pain.  Musculoskeletal: Negative for neck pain and neck stiffness.  Skin: Negative for rash and wound.  Allergic/Immunologic: Negative for immunocompromised state.  Neurological: Positive for light-headedness. Negative for syncope, weakness and headaches.     Physical Exam Updated Vital Signs BP (!) 217/96 (BP Location: Right Arm)   Pulse 67   Temp 98.3 F (36.8 C) (Oral)   Resp 20   Ht 5\' 9"  (1.753 m)   Wt 212 lb 8.4 oz (96.4 kg)   SpO2 98%   BMI 31.38 kg/m   Physical Exam  Constitutional: He is oriented to person, place, and time. He appears well-developed and well-nourished. No distress.  HENT:  Head: Normocephalic and atraumatic.  Mouth/Throat: Oropharynx is clear and moist.  Eyes: Conjunctivae are normal. Pupils are equal, round, and reactive to light.  Neck: Neck supple.  Cardiovascular: Normal rate, regular rhythm and normal heart sounds.  Exam reveals no friction rub.   No murmur heard. Pulmonary/Chest: Effort normal and breath sounds normal. No respiratory distress. He has no wheezes. He has no rales.  Abdominal: He exhibits no distension. There is no tenderness.  Musculoskeletal: He exhibits no edema.  Neurological: He is alert and oriented to person, place, and time. He exhibits normal muscle tone.  Skin: Skin is warm. Capillary refill takes less than 2 seconds.  Nursing note and vitals reviewed.    ED Treatments / Results  Labs (all labs ordered are listed, but only  abnormal results are displayed) Labs Reviewed  BASIC METABOLIC PANEL - Abnormal; Notable for the following:       Result Value   Glucose, Bld 118 (*)    All other components within normal limits  CBC - Abnormal; Notable for the following:    WBC 10.8 (*)    All other components within normal limits  BRAIN NATRIURETIC PEPTIDE  I-STAT TROPOININ, ED    EKG  EKG Interpretation  Date/Time:  Sunday December 18 2015 20:01:11 EDT Ventricular Rate:  65 PR Interval:    QRS Duration: 88 QT Interval:  394 QTC Calculation: 410 R Axis:   49 Text Interpretation:  Sinus arrhythmia Ventricular premature contractions No significant change since last tracing Deeper TWI in lateral leads Rate has decreased Confirmed by Ellender Hose MD, Lysbeth Galas [phone]) on 12/19/2015 1:15:25 AM       Radiology Dg Chest 2 View  Result Date: 12/18/2015 CLINICAL DATA:  75 year old male with acute chest pain since this morning. Shortness of breath. Initial  encounter. EXAM: CHEST  2 VIEW COMPARISON:  02/11/2015 and earlier. FINDINGS: Borderline to mild cardiomegaly. Other mediastinal contours are within normal limits. Visualized tracheal air column is within normal limits. No pneumothorax, pulmonary edema, pleural effusion or confluent pulmonary opacity. No acute osseous abnormality identified. IMPRESSION: Cardiac size at the upper limits of normal to mildly enlarged. No acute cardiopulmonary abnormality. Electronically Signed   By: Genevie Ann M.D.   On: 12/18/2015 21:01    Procedures Procedures (including critical care time)  Medications Ordered in ED Medications  nitroGLYCERIN (NITROSTAT) SL tablet 0.4 mg (not administered)  amLODipine (NORVASC) tablet 5 mg (5 mg Oral Given 12/18/15 2219)  gi cocktail (Maalox,Lidocaine,Donnatal) (30 mLs Oral Given 12/19/15 0048)     Initial Impression / Assessment and Plan / ED Course  I have reviewed the triage vital signs and the nursing notes.  Pertinent labs & imaging results that were  available during my care of the patient were reviewed by me and considered in my medical decision making (see chart for details).  Clinical Course  75 year old male with past medical history of hypertension who presents with chest pain. On arrival, patient is markedly hypertensive but otherwise hemodynamically stable. Examination is as above. EKG shows diffuse T wave changes but no acute ST segment changes. Primary concern is, ACS, although story is somewhat atypical. He is not hypoxic, tachypnea, and I do not suspect PE. Pain is not consistent with dissection and he has symmetric pulses in upper and lower extremities. Hear score is greater than 4. Will give his home antihypertensives and obtain screening lab work. Regarding his lightheadedness, this is an ongoing issue and is consistent with orthostasis. He has no focal neurological deficits.  Initial lab work is unremarkable. Initial troponin is negative. Patient remains hypertensive. I have given him his home antihypertensive. Given his high risk heart score, will admit for observation and serial troponins.   Final Clinical Impressions(s) / ED Diagnoses   Final diagnoses:  Chest pain with high risk of acute coronary syndrome  Hypertension, essential    New Prescriptions Current Discharge Medication List       Duffy Bruce, MD 12/19/15 [phone]

## 2015-12-20 ENCOUNTER — Telehealth: Payer: Self-pay | Admitting: *Deleted

## 2015-12-20 LAB — HEMOGLOBIN A1C
Hgb A1c MFr Bld: 5.7 % — ABNORMAL HIGH (ref 4.8–5.6)
Mean Plasma Glucose: 117 mg/dL

## 2015-12-20 NOTE — Telephone Encounter (Signed)
Called pt completed TCM call below.../lmb  Transition Care Management Follow-up Telephone Call   Date discharged? 12/19/15   How have you been since you were released from the hospital? Pt states he is ok having arthritis pain in his hand/shoulder and hips   Do you understand why you were in the hospital? YES   Do you understand the discharge instructions? YES   Where were you discharged to? Home   Items Reviewed:  Medications reviewed: YES  Allergies reviewed: YES  Dietary changes reviewed: YES  Referrals reviewed: YES   Functional Questionnaire:   Activities of Daily Living (ADLs):   He states he are independent in the following: ambulation, bathing and hygiene, feeding, continence, grooming, toileting and dressing States he doesn't require assistance   Any transportation issues/concerns?: NO   Any patient concerns? {YES, just the pain in his hand & shoulder. He states he may need to get MD to give him another cortisone injection, but will let him know at the visit   Confirmed importance and date/time of follow-up visits scheduled YES. appt 12/26/15  Provider Appointment booked with Dr. Ronnald Ramp  Confirmed with patient if condition begins to worsen call PCP or go to the ER.  Patient was given the office number and encouraged to call back with question or concerns.  : YES

## 2015-12-20 NOTE — Telephone Encounter (Signed)
Tried calling to set-up TCM appt no answer x's 10 rings...Adrian Miller

## 2015-12-21 LAB — MRSA CULTURE

## 2015-12-26 ENCOUNTER — Ambulatory Visit (INDEPENDENT_AMBULATORY_CARE_PROVIDER_SITE_OTHER): Payer: Medicare Other | Admitting: Internal Medicine

## 2015-12-26 ENCOUNTER — Encounter: Payer: Self-pay | Admitting: Internal Medicine

## 2015-12-26 ENCOUNTER — Other Ambulatory Visit (INDEPENDENT_AMBULATORY_CARE_PROVIDER_SITE_OTHER): Payer: Medicare Other

## 2015-12-26 VITALS — BP 190/100 | HR 70 | Temp 98.4°F | Resp 16 | Ht 69.0 in | Wt 216.5 lb

## 2015-12-26 DIAGNOSIS — D508 Other iron deficiency anemias: Secondary | ICD-10-CM

## 2015-12-26 DIAGNOSIS — R7989 Other specified abnormal findings of blood chemistry: Secondary | ICD-10-CM | POA: Diagnosis not present

## 2015-12-26 DIAGNOSIS — I10 Essential (primary) hypertension: Secondary | ICD-10-CM

## 2015-12-26 DIAGNOSIS — K2100 Gastro-esophageal reflux disease with esophagitis, without bleeding: Secondary | ICD-10-CM

## 2015-12-26 DIAGNOSIS — R945 Abnormal results of liver function studies: Secondary | ICD-10-CM

## 2015-12-26 DIAGNOSIS — Z23 Encounter for immunization: Secondary | ICD-10-CM | POA: Diagnosis not present

## 2015-12-26 DIAGNOSIS — E785 Hyperlipidemia, unspecified: Secondary | ICD-10-CM

## 2015-12-26 DIAGNOSIS — D519 Vitamin B12 deficiency anemia, unspecified: Secondary | ICD-10-CM

## 2015-12-26 DIAGNOSIS — K759 Inflammatory liver disease, unspecified: Secondary | ICD-10-CM | POA: Diagnosis not present

## 2015-12-26 LAB — COMPREHENSIVE METABOLIC PANEL
ALT: 23 U/L (ref 0–53)
AST: 14 U/L (ref 0–37)
Albumin: 4.5 g/dL (ref 3.5–5.2)
Alkaline Phosphatase: 119 U/L — ABNORMAL HIGH (ref 39–117)
BUN: 13 mg/dL (ref 6–23)
CO2: 26 mEq/L (ref 19–32)
Calcium: 9.1 mg/dL (ref 8.4–10.5)
Chloride: 104 mEq/L (ref 96–112)
Creatinine, Ser: 0.99 mg/dL (ref 0.40–1.50)
GFR: 94.87 mL/min (ref >60.00)
Glucose, Bld: 88 mg/dL (ref 70–99)
Potassium: 4.1 mEq/L (ref 3.5–5.1)
Sodium: 139 mEq/L (ref 135–145)
Total Bilirubin: 0.4 mg/dL (ref 0.2–1.2)
Total Protein: 8.1 g/dL (ref 6.0–8.3)

## 2015-12-26 LAB — LIPID PANEL
Cholesterol: 143 mg/dL (ref 0–200)
HDL: 35 mg/dL — ABNORMAL LOW (ref >39.00)
LDL Cholesterol: 74 mg/dL (ref 0–99)
NonHDL: 108.27
Total CHOL/HDL Ratio: 4
Triglycerides: 172 mg/dL — ABNORMAL HIGH (ref 0.0–149.0)
VLDL: 34.4 mg/dL (ref 0.0–40.0)

## 2015-12-26 LAB — TSH: TSH: 0.76 u[IU]/mL (ref 0.35–4.50)

## 2015-12-26 LAB — GAMMA GT: GGT: 106 U/L — ABNORMAL HIGH (ref 7–51)

## 2015-12-26 MED ORDER — CHLORTHALIDONE 25 MG PO TABS: 25.0000 mg | ORAL_TABLET | Freq: Every day | ORAL | 1 refills | Status: DC

## 2015-12-26 NOTE — Patient Instructions (Signed)
Hypertension Hypertension, commonly called high blood pressure, is when the force of blood pumping through your arteries is too strong. Your arteries are the blood vessels that carry blood from your heart throughout your body. A blood pressure reading consists of a higher number over a lower number, such as 110/72. The higher number (systolic) is the pressure inside your arteries when your heart pumps. The lower number (diastolic) is the pressure inside your arteries when your heart relaxes. Ideally you want your blood pressure below 120/80. Hypertension forces your heart to work harder to pump blood. Your arteries may become narrow or stiff. Having untreated or uncontrolled hypertension can cause heart attack, stroke, kidney disease, and other problems. RISK FACTORS Some risk factors for high blood pressure are controllable. Others are not.  Risk factors you cannot control include:   Race. You may be at higher risk if you are African American.  Age. Risk increases with age.  Gender. Men are at higher risk than women before age 45 years. After age 65, women are at higher risk than men. Risk factors you can control include:  Not getting enough exercise or physical activity.  Being overweight.  Getting too much fat, sugar, calories, or salt in your diet.  Drinking too much alcohol. SIGNS AND SYMPTOMS Hypertension does not usually cause signs or symptoms. Extremely high blood pressure (hypertensive crisis) may cause headache, anxiety, shortness of breath, and nosebleed. DIAGNOSIS To check if you have hypertension, your health care provider will measure your blood pressure while you are seated, with your arm held at the level of your heart. It should be measured at least twice using the same arm. Certain conditions can cause a difference in blood pressure between your right and left arms. A blood pressure reading that is higher than normal on one occasion does not mean that you need treatment. If  it is not clear whether you have high blood pressure, you may be asked to return on a different day to have your blood pressure checked again. Or, you may be asked to monitor your blood pressure at home for 1 or more weeks. TREATMENT Treating high blood pressure includes making lifestyle changes and possibly taking medicine. Living a healthy lifestyle can help lower high blood pressure. You may need to change some of your habits. Lifestyle changes may include:  Following the DASH diet. This diet is high in fruits, vegetables, and whole grains. It is low in salt, red meat, and added sugars.  Keep your sodium intake below 2,300 mg per day.  Getting at least 30-45 minutes of aerobic exercise at least 4 times per week.  Losing weight if necessary.  Not smoking.  Limiting alcoholic beverages.  Learning ways to reduce stress. Your health care provider may prescribe medicine if lifestyle changes are not enough to get your blood pressure under control, and if one of the following is true:  You are 18-59 years of age and your systolic blood pressure is above 140.  You are 60 years of age or older, and your systolic blood pressure is above 150.  Your diastolic blood pressure is above 90.  You have diabetes, and your systolic blood pressure is over 140 or your diastolic blood pressure is over 90.  You have kidney disease and your blood pressure is above 140/90.  You have heart disease and your blood pressure is above 140/90. Your personal target blood pressure may vary depending on your medical conditions, your age, and other factors. HOME CARE INSTRUCTIONS    Have your blood pressure rechecked as directed by your health care provider.   Take medicines only as directed by your health care provider. Follow the directions carefully. Blood pressure medicines must be taken as prescribed. The medicine does not work as well when you skip doses. Skipping doses also puts you at risk for  problems.  Do not smoke.   Monitor your blood pressure at home as directed by your health care provider. SEEK MEDICAL CARE IF:   You think you are having a reaction to medicines taken.  You have recurrent headaches or feel dizzy.  You have swelling in your ankles.  You have trouble with your vision. SEEK IMMEDIATE MEDICAL CARE IF:  You develop a severe headache or confusion.  You have unusual weakness, numbness, or feel faint.  You have severe chest or abdominal pain.  You vomit repeatedly.  You have trouble breathing. MAKE SURE YOU:   Understand these instructions.  Will watch your condition.  Will get help right away if you are not doing well or get worse.   This information is not intended to replace advice given to you by your health care provider. Make sure you discuss any questions you have with your health care provider.   Document Released: 04/23/2005 Document Revised: 09/07/2014 Document Reviewed: 02/13/2013 Elsevier Interactive Patient Education 2016 [employer]  

## 2015-12-26 NOTE — Progress Notes (Signed)
Subjective:  Patient ID: Adrian Miller, male    DOB: [birth year]  Age: 75 y.o. MRN: [id number]  CC: Hypertension and Hyperlipidemia   HPI Adrian Miller presents for a hosp f/up.Marland KitchenMarland KitchenMarland KitchenHe tells me the pain that he was experiencing has resolved. He denies odynophagia or dysphagia. He had elevated liver enzymes but he was not told of this and is not aware of anything that would've caused elevated liver enzymes. He denies any episodes of abdominal pain, nausea, vomiting, loss of appetite, or turning yellow. He tells me he rarely drinks alcohol. He is concerned that his blood pressure is not been well controlled since discharge.  Principal Problem:   Chest pain, atypical   Active Problems:   Hiatal hernia   Esophageal stricture   Hyperglycemia   B12 deficiency anemia   GERD (gastroesophageal reflux disease)   Uncontrolled hypertension   Tobacco abuse counseling   Brief narrative/history of present illness 75 year old male with ongoing heavy tobacco use, hiatal hernia, history of esophageal stricture, GERD, hypertension, history of GI bleed early this year presented with substernal chest pain radiating to the shoulder. Patient reports that his symptoms are similar to the gastritis but was more intense. Symptoms started when he was lying down, 3/-4/10 in severity without any aggravating or relieving factors. He reports some belching associated with it. Denies Nausea, vomiting, shortness of breath, abdominal pain, headache, dizziness, fever, chills, bowel or urinary symptoms. Denies use of NSAIDs but reports taking BC powder frequently for his right hip pain. Patient had elevated blood pressure on presentation. Blood work showed mild leukocytosis, normal hemoglobin and electrolytes. Initial troponin was negative. EKG showed normal sinus rhythm with T-wave inversion in lateral leads (unchanged from prior). Chest x-ray unremarkable. Patient admitted for further workup.  Hospital course Chest  pain Appears to be atypical and likely associated with GERD. Patient continues to smoke heavily and also using BC powder for his hip pain. Stable on telemetry overnight. Serial troponins were negative. Patient remained chest pain-free at this morning. Counseled on quitting smoking and avoiding NSAIDs/BC powder. 2-D echo done earlier this year with normal EF and no wall motion abnormality. I will prescribe him a course of PPI. Stable to be discharged home with outpatient follow-up.   Outpatient Medications Prior to Visit  Medication Sig Dispense Refill  . acetaminophen (TYLENOL) 500 MG tablet Take 1,000 mg by mouth every 8 (eight) hours as needed for mild pain or moderate pain.    [name] amLODipine (NORVASC) 5 MG tablet Take 1 tablet (5 mg total) by mouth daily. 30 tablet 0  . cyanocobalamin 2000 MCG tablet Take 1 tablet (2,000 mcg total) by mouth daily. 90 tablet 3  . ferrous sulfate 325 (65 FE) MG tablet Take 1 tablet (325 mg total) by mouth 3 (three) times daily with meals. (Patient taking differently: Take 325 mg by mouth 2 (two) times daily with a meal. ) 90 tablet 5  . nicotine (NICODERM CQ - DOSED IN MG/24 HOURS) 21 mg/24hr patch Place 1 patch (21 mg total) onto the skin daily. 28 patch 0  . oxyCODONE-acetaminophen (PERCOCET) 10-325 MG tablet Take 1 tablet by mouth every 8 (eight) hours as needed for pain. 60 tablet 0  . pantoprazole (PROTONIX) 40 MG tablet Take 1 tablet (40 mg total) by mouth daily. 30 tablet 0   No facility-administered medications prior to visit.     ROS Review of Systems  Constitutional: Negative.  Negative for activity change, chills, diaphoresis, fatigue, fever and unexpected weight  change.  HENT: Negative.  Negative for sinus pressure and trouble swallowing.   Eyes: Negative.   Respiratory: Negative.  Negative for cough, choking, chest tightness, shortness of breath and stridor.   Cardiovascular: Negative for chest pain, palpitations and leg swelling.    Gastrointestinal: Negative.  Negative for abdominal pain, blood in stool, constipation, diarrhea, nausea and vomiting.  Endocrine: Negative.   Genitourinary: Negative.  Negative for difficulty urinating, dysuria and frequency.  Musculoskeletal: Negative.  Negative for back pain, myalgias and neck pain.  Skin: Negative.  Negative for color change and rash.  Allergic/Immunologic: Negative.   Neurological: Negative.  Negative for dizziness, weakness and headaches.  Hematological: Negative.  Negative for adenopathy. Does not bruise/bleed easily.  Psychiatric/Behavioral: Negative.     Objective:  BP (!) 190/100 (BP Location: Left Arm, Patient Position: Sitting, Cuff Size: Normal)   Pulse 70   Temp 98.4 F (36.9 C) (Oral)   Resp 16   Ht 5\' 9"  (1.753 m)   Wt 216 lb 8 oz (98.2 kg)   SpO2 96%   BMI 31.97 kg/m   BP Readings from Last 3 Encounters:  12/26/15 (!) 190/100  12/19/15 (!) 164/81  06/15/15 134/82    Wt Readings from Last 3 Encounters:  12/26/15 216 lb 8 oz (98.2 kg)  12/19/15 212 lb 8.4 oz (96.4 kg)  06/15/15 208 lb (94.3 kg)    Physical Exam  Constitutional: He is oriented to person, place, and time. He appears well-developed and well-nourished. No distress.  HENT:  Nose: Nose normal.  Mouth/Throat: Oropharynx is clear and moist. No oropharyngeal exudate.  Eyes: Conjunctivae are normal. Right eye exhibits no discharge. Left eye exhibits no discharge. No scleral icterus.  Neck: Normal range of motion. Neck supple. No JVD present. No tracheal deviation present. No thyromegaly present.  Cardiovascular: Normal rate, regular rhythm, normal heart sounds and intact distal pulses.  Exam reveals no gallop and no friction rub.   No murmur heard. Pulmonary/Chest: Effort normal and breath sounds normal. No stridor. No respiratory distress. He has no wheezes. He has no rales. He exhibits no tenderness.  Abdominal: Soft. Bowel sounds are normal. He exhibits no distension and no  mass. There is no tenderness. There is no rebound and no guarding.  Musculoskeletal: Normal range of motion. He exhibits no edema, tenderness or deformity.  Lymphadenopathy:    He has no cervical adenopathy.  Neurological: He is oriented to person, place, and time.  Skin: Skin is warm and dry. No rash noted. He is not diaphoretic. No erythema. No pallor.  Vitals reviewed.   Lab Results  Component Value Date   WBC 10.7 (H) 12/19/2015   HGB 14.4 12/19/2015   HCT 42.5 12/19/2015   PLT 325 12/19/2015   GLUCOSE 88 12/26/2015   CHOL 143 12/26/2015   TRIG 172.0 (H) 12/26/2015   HDL 35.00 (L) 12/26/2015   LDLCALC 74 12/26/2015   ALT 23 12/26/2015   AST 14 12/26/2015   NA 139 12/26/2015   K 4.1 12/26/2015   CL 104 12/26/2015   CREATININE 0.99 12/26/2015   BUN 13 12/26/2015   CO2 26 12/26/2015   TSH 0.76 12/26/2015   PSA 0.87 03/18/2014   INR 1.08 02/11/2015   HGBA1C 5.7 (H) 12/19/2015    Dg Chest 2 View  Result Date: 12/18/2015 CLINICAL DATA:  75 year old male with acute chest pain since this morning. Shortness of breath. Initial encounter. EXAM: CHEST  2 VIEW COMPARISON:  02/11/2015 and earlier. FINDINGS: Borderline to mild  cardiomegaly. Other mediastinal contours are within normal limits. Visualized tracheal air column is within normal limits. No pneumothorax, pulmonary edema, pleural effusion or confluent pulmonary opacity. No acute osseous abnormality identified. IMPRESSION: Cardiac size at the upper limits of normal to mildly enlarged. No acute cardiopulmonary abnormality. Electronically Signed   By: Genevie Ann M.D.   On: 12/18/2015 21:01    Assessment & Plan:   Ryananthony was seen today for hypertension and hyperlipidemia.  Diagnoses and all orders for this visit:  Need for prophylactic vaccination and inoculation against influenza -     Flu Vaccine QUAD 36+ mos IM  B12 deficiency anemia- improvement noted  Other iron deficiency anemias- improvement noted  Benign essential  HTN- his blood pressure is not adequately well controlled on amlodipine alone, his electrolytes and renal function do not indicate any secondary causes or end organ damage, I will add a thiazide diuretic for better blood pressure control. -     Comprehensive metabolic panel; Future -     TSH; Future -     chlorthalidone (HYGROTON) 25 MG tablet; Take 1 tablet (25 mg total) by mouth daily.  Elevated LFTs- His AST and ALT have normalized now, his GGT is slightly elevated so I am concerned that alcohol may be a causative factor in his elevated liver enzymes, I'm also concerned about fatty liver disease so I have ordered an abdominal ultrasound to see what the liver looks like. His screening for acute viral hepatitis is all negative. -     Comprehensive metabolic panel; Future -     Gamma GT; Future -     Hepatitis panel, acute; Future -     Hepatitis C antibody; Future -     US Abdomen Complete; Future  Hyperlipidemia with target LDL less than 130- his Framingham risk score is 25% so I have asked him to start taking a statin for risk reduction -     Lipid panel; Future -     TSH; Future   I am having Mr. Cheatom start on chlorthalidone. I am also having him maintain his acetaminophen, ferrous sulfate, cyanocobalamin, oxyCODONE-acetaminophen, amLODipine, pantoprazole, and nicotine.  Meds ordered this encounter  Medications  . chlorthalidone (HYGROTON) 25 MG tablet    Sig: Take 1 tablet (25 mg total) by mouth daily.    Dispense:  90 tablet    Refill:  1     Follow-up: Return in about 4 weeks (around 01/23/2016).  Scarlette Calico, MD

## 2015-12-26 NOTE — Progress Notes (Signed)
Pre visit review using our clinic review tool, if applicable. No additional management support is needed unless otherwise documented below in the visit note. 

## 2015-12-27 LAB — HEPATITIS PANEL, ACUTE
HCV Ab: NEGATIVE
Hep A IgM: NONREACTIVE
Hep B C IgM: NONREACTIVE
Hepatitis B Surface Ag: NEGATIVE

## 2015-12-27 LAB — HEPATITIS C ANTIBODY: HCV Ab: NEGATIVE

## 2015-12-27 MED ORDER — ATORVASTATIN CALCIUM 20 MG PO TABS: 20.0000 mg | ORAL_TABLET | Freq: Every day | ORAL | 3 refills | Status: DC

## 2015-12-27 NOTE — Progress Notes (Signed)
[name] [address], [place] [address] Phone: [phone] Subjective:     CC: Right hip and right shoulder pain  [id number]  Adrian Miller is a 75 y.o. male coming in with complaint of right hip and right shoulder pain.   Regarding patient's right hip we do know the patient does have severe arthritic changes of the right hip. No severe arthritis of the right hand. We've encouraged him to get a hip replacement but due to other chronic ailments he has not been able to do so. Patient states That he had pain again. Once to avoid any type of surgical intervention at this time. Continues to try to be active. Recently was having worsening pain.    worsening left wrist pain. Has had known carpal tunnel before. Has respond well to injection. Has been quite sometimes that she's had one. Does not remember a true injury at this time.  Past Medical History:  Diagnosis Date  . Acute esophagitis 06/01/2015  . Benign essential HTN 05/31/2015  . Duodenal ulcer hemorrhage   . Esophageal stricture 06/01/2015  . Hiatal hernia 06/01/2015   Past Surgical History:  Procedure Laterality Date  . ESOPHAGOGASTRODUODENOSCOPY N/A 05/31/2015   Procedure: ESOPHAGOGASTRODUODENOSCOPY (EGD);  Surgeon: Ladene Artist, MD;  Location: [facility];  Service: Endoscopy;  Laterality: N/A;  . HERNIA REPAIR  1975  . LEFT ROTATOR CUFF REPAIR  2002  . REPLACEMENT TOTAL KNEE Left   . TOTAL KNEE ARTHROPLASTY Right 02/22/2015   Procedure: TOTAL KNEE ARTHROPLASTY;  Surgeon: Melrose Nakayama, MD;  Location: [facility];  Service: Orthopedics;  Laterality: Right;   Social History   Social History  . Marital status: Single    Spouse name: N/A  . Number of children: N/A  . Years of education: N/A   Occupational History  . Not on file.   Social History Main Topics  . Smoking status: Current Every Day Smoker    Packs/day: 0.50    Years: 51.00    Types: Cigarettes  . Smokeless tobacco:  Former Systems developer    Quit date: 11/03/2014     Comment: quit 2 weeks ago  . Alcohol use No  . Drug use: No  . Sexual activity: No   Other Topics Concern  . Not on file   Social History Narrative  . No narrative on file   Allergies  Allergen Reactions  . Asa [Aspirin]     angioedema   Family History  Problem Relation Age of Onset  . Hypertension Mother   . Heart attack Sister    no family history of rheumatological diseases.  Past medical history, social, surgical and family history all reviewed in electronic medical record.   Review of Systems: No headache, visual changes, nausea, vomiting, diarrhea, constipation, dizziness, abdominal pain, skin rash, fevers, chills, night sweats, weight loss, swollen lymph nodes, body aches, joint swelling, muscle aches, chest pain, shortness of breath, mood changes.   Objective  Blood pressure 140/80, pulse 78, weight 218 lb (98.9 kg), SpO2 98 %.  General: No apparent distress alert and oriented x3 mood and affect normal, dressed appropriately.  HEENT: Pupils equal, extraocular movements intact  Respiratory: Patient's speak in full sentences and does not appear short of breath  Cardiovascular: No lower extremity edema, non tender, no erythema  Skin: Warm dry intact with no signs of infection or rash on extremities or on axial skeleton.  Abdomen: Soft nontender  Neuro: Cranial nerves II through XII are  intact, neurovascularly intact in all extremities with 2+ DTRs and 2+ pulses.  Lymph: No lymphadenopathy of posterior or anterior cervical chain or axillae bilaterally.  Gait normal with good balance and coordination.  MSK:  Non tender with full range of motion and good stability and symmetric strength and tone of shoulders, elbows, , and ankles bilaterally.  Hip: Right ROM IR: 15 Deg with discomfort in the groin, ER: 45 Deg, Flexion: 100 Deg, Extension: 80 Deg, Abduction: 45 Deg, Adduction: 15 Deg Strength 4 out of 5 strength compared to 5 out of  5 on the contralateral side Pelvic alignment unremarkable to inspection and palpation. Standing hip rotation and gait without trendelenburg sign / unsteadiness. Greater trochanter without tenderness to palpation. No tenderness over piriformis and greater trochanter. Significant pain with internal rotation No SI joint tenderness and normal minimal SI movement. No significant change from previous exam.  Left wrist exam shows the patient does have positive carpal tunnel Tinel's sign. As well as Phalen sign. Mild limitation in extension of the wrist. Good grip strength. Neurovascularly intact distally.  Procedure: Real-time Ultrasound Guided Injection of right hip Device: GE Logiq E  Ultrasound guided injection is preferred based studies that show increased duration, increased effect, greater accuracy, decreased procedural pain, increased response rate with ultrasound guided versus blind injection.  Verbal informed consent obtained.  Time-out conducted.  Noted no overlying erythema, induration, or other signs of local infection.  Skin prepped in a sterile fashion.  Local anesthesia: Topical Ethyl chloride.  With sterile technique and under real time ultrasound guidance:  Anterior capsule visualized, needle visualized going to the head neck junction at the anterior capsule. Pictures taken. Patient did have injection of 3 cc of 1% lidocaine, 3 cc of 0.5% Marcaine, and 1 cc of Kenalog 40 mg/dL. Completed without difficulty  Pain immediately resolved suggesting accurate placement of the medication.  Advised to call if fevers/chills, erythema, induration, drainage, or persistent bleeding.  Images permanently stored and available for review in the ultrasound unit.  Impression: Technically successful ultrasound guided injection.   Procedure: Real-time Ultrasound Guided Injection of left carpal tunnel Device: GE Logiq E  Ultrasound guided injection is preferred based studies that show increased  duration, increased effect, greater accuracy, decreased procedural pain, increased response rate with ultrasound guided versus blind injection.  Verbal informed consent obtained.  Time-out conducted.  Noted no overlying erythema, induration, or other signs of local infection.  Skin prepped in a sterile fashion.  Local anesthesia: Topical Ethyl chloride.  With sterile technique and under real time ultrasound guidance:  median nerve visualized.  23g 5/8 inch needle inserted distal to proximal approach into nerve sheath. Pictures taken nfor needle placement. Patient did have injection of 2 cc of 1% lidocaine, 1 cc of 0.5% Marcaine, and 1 cc of Kenalog 40 mg/dL. Completed without difficulty  Pain immediately resolved suggesting accurate placement of the medication.  Advised to call if fevers/chills, erythema, induration, drainage, or persistent bleeding.  Images permanently stored and available for review in the ultrasound unit.  Impression: Technically successful ultrasound guided injection.     Impression and Recommendations:     This case required medical decision making of moderate complexity.

## 2015-12-28 ENCOUNTER — Other Ambulatory Visit: Payer: Self-pay

## 2015-12-28 ENCOUNTER — Encounter: Payer: Self-pay | Admitting: Family Medicine

## 2015-12-28 ENCOUNTER — Ambulatory Visit (INDEPENDENT_AMBULATORY_CARE_PROVIDER_SITE_OTHER): Payer: Medicare Other | Admitting: Family Medicine

## 2015-12-28 VITALS — BP 140/80 | HR 78 | Wt 218.0 lb

## 2015-12-28 DIAGNOSIS — M1611 Unilateral primary osteoarthritis, right hip: Secondary | ICD-10-CM

## 2015-12-28 DIAGNOSIS — M1990 Unspecified osteoarthritis, unspecified site: Secondary | ICD-10-CM

## 2015-12-28 DIAGNOSIS — M25551 Pain in right hip: Secondary | ICD-10-CM

## 2015-12-28 DIAGNOSIS — G5602 Carpal tunnel syndrome, left upper limb: Secondary | ICD-10-CM | POA: Diagnosis not present

## 2015-12-28 MED ORDER — FUROSEMIDE 20 MG PO TABS: 20.0000 mg | ORAL_TABLET | Freq: Every day | ORAL | 3 refills | Status: DC | PRN

## 2015-12-28 NOTE — Patient Instructions (Signed)
Good to see you  Injected your hip and your wrist today  Hopefully that helps I can repeat injection in your hip every 3 months if needed but last as long as you can  You will need to think about replacement in the next year or so.  Stay active For the stomach fluid lets do lasix 20 mg daily for 5 days then as needed for the fluid.  See me again when you need me.

## 2015-12-28 NOTE — Assessment & Plan Note (Signed)
Given an injection today. We discussed bracing, icing and topical anti-inflammatories. Patient will avoid repetitive motion. Follow-up again in 3-4 weeks if no significant improvement. Avoid anti-inflammatories orally second due to esophageal stricture and recent duodenal ulcer.

## 2015-12-28 NOTE — Assessment & Plan Note (Signed)
Patient given an injection today and tolerated the procedure well. We discussed icing regimen and home exercises. We discussed which activities to do in which ones to avoid. Patient come back and see me again in 3-4 weeks.

## 2016-01-10 ENCOUNTER — Telehealth: Payer: Self-pay | Admitting: Internal Medicine

## 2016-01-10 ENCOUNTER — Other Ambulatory Visit: Payer: Self-pay | Admitting: Internal Medicine

## 2016-01-10 DIAGNOSIS — M159 Polyosteoarthritis, unspecified: Secondary | ICD-10-CM

## 2016-01-10 MED ORDER — OXYCODONE-ACETAMINOPHEN 10-325 MG PO TABS: 1.0000 | ORAL_TABLET | Freq: Three times a day (TID) | ORAL | 0 refills | Status: DC | PRN

## 2016-01-10 NOTE — Telephone Encounter (Signed)
Pt informed rx is ready for pick up

## 2016-01-10 NOTE — Telephone Encounter (Signed)
RX written 

## 2016-01-10 NOTE — Telephone Encounter (Signed)
Patient called to request fill of oxyCODONE-acetaminophen (PERCOCET) 10-325 MG tablet [id number]

## 2016-01-16 ENCOUNTER — Encounter: Payer: Self-pay | Admitting: Internal Medicine

## 2016-01-16 ENCOUNTER — Ambulatory Visit (HOSPITAL_COMMUNITY)
Admission: RE | Admit: 2016-01-16 | Discharge: 2016-01-16 | Disposition: A | Discharge status: Home / Self Care | Payer: Medicare Other | Source: Ambulatory Visit | Attending: Internal Medicine | Admitting: Internal Medicine

## 2016-01-16 ENCOUNTER — Other Ambulatory Visit: Payer: Self-pay | Admitting: Internal Medicine

## 2016-01-16 ENCOUNTER — Telehealth: Payer: Self-pay | Admitting: Family Medicine

## 2016-01-16 DIAGNOSIS — K812 Acute cholecystitis with chronic cholecystitis: Secondary | ICD-10-CM | POA: Insufficient documentation

## 2016-01-16 DIAGNOSIS — N281 Cyst of kidney, acquired: Secondary | ICD-10-CM | POA: Diagnosis not present

## 2016-01-16 DIAGNOSIS — M25552 Pain in left hip: Secondary | ICD-10-CM

## 2016-01-16 DIAGNOSIS — K8020 Calculus of gallbladder without cholecystitis without obstruction: Secondary | ICD-10-CM | POA: Diagnosis not present

## 2016-01-16 DIAGNOSIS — R945 Abnormal results of liver function studies: Secondary | ICD-10-CM

## 2016-01-16 DIAGNOSIS — R7989 Other specified abnormal findings of blood chemistry: Secondary | ICD-10-CM | POA: Insufficient documentation

## 2016-01-16 NOTE — Telephone Encounter (Signed)
I would see him but need an xray of left hip first to tell him for sure if I would do it.

## 2016-01-16 NOTE — Telephone Encounter (Signed)
Pt called asking to see if Dr. Tamala Julian would give him injection on the Left hip. Offer an appt but pt wants to see if Dr. Tamala Julian would do it first. Please call pt back

## 2016-01-17 NOTE — Telephone Encounter (Signed)
Spoke to pt, schedule appt for injection & ordered xray.

## 2016-01-17 NOTE — Progress Notes (Signed)
[name] [address], [place] [address] Phone: [phone] Subjective:     CC: Left hip pain  [id number]  Adrian Miller is a 75 y.o. male coming in with complaint of left hip pain. Patient has known arthritic changes of the right hip. Has avoided any type of replacement at the moment. Recent given injections doing well on the right side. Patient states that he is having similar presentation on the left leg. Patient states that this happened after he was on a tractor. States that he was bouncing around a lot but when he was getting off a tractor he had significant weakness on the left leg. States that the pain seems to be more on the posterior aspect ventrally on the groin area to the contralateral side. States that the pain is worse with activity especially with flexing. Can be a little improvement when standing still. Seems to be constant though. Not responding to over-the-counter medications. Rates the severity as 9 out of 10. Possibly worsen his hip on the other side has ever been.  X-rays taken today were independently visualized by me and showed only mild osteophytic changes of the hip.    Past Medical History:  Diagnosis Date  . Acute esophagitis 06/01/2015  . Benign essential HTN 05/31/2015  . Duodenal ulcer hemorrhage   . Esophageal stricture 06/01/2015  . Hiatal hernia 06/01/2015   Past Surgical History:  Procedure Laterality Date  . ESOPHAGOGASTRODUODENOSCOPY N/A 05/31/2015   Procedure: ESOPHAGOGASTRODUODENOSCOPY (EGD);  Surgeon: Ladene Artist, MD;  Location: [facility];  Service: Endoscopy;  Laterality: N/A;  . HERNIA REPAIR  1975  . LEFT ROTATOR CUFF REPAIR  2002  . REPLACEMENT TOTAL KNEE Left   . TOTAL KNEE ARTHROPLASTY Right 02/22/2015   Procedure: TOTAL KNEE ARTHROPLASTY;  Surgeon: Melrose Nakayama, MD;  Location: [facility];  Service: Orthopedics;  Laterality: Right;   Social History   Social History  . Marital status: Single     Spouse name: N/A  . Number of children: N/A  . Years of education: N/A   Occupational History  . Not on file.   Social History Main Topics  . Smoking status: Current Every Day Smoker    Packs/day: 0.50    Years: 51.00    Types: Cigarettes  . Smokeless tobacco: Former Systems developer    Quit date: 11/03/2014     Comment: quit 2 weeks ago  . Alcohol use No  . Drug use: No  . Sexual activity: No   Other Topics Concern  . Not on file   Social History Narrative  . No narrative on file   Allergies  Allergen Reactions  . Asa [Aspirin]     angioedema   Family History  Problem Relation Age of Onset  . Hypertension Mother   . Heart attack Sister    no family history of rheumatological diseases.  Past medical history, social, surgical and family history all reviewed in electronic medical record.   Review of Systems: No headache, visual changes, nausea, vomiting, diarrhea, constipation, dizziness, abdominal pain, skin rash, fevers, chills, night sweats, weight loss, swollen lymph nodes, body aches, joint swelling, muscle aches, chest pain, shortness of breath, mood changes.   Objective  Blood pressure 128/82, pulse 72, weight 210 lb (95.3 kg), SpO2 95 %.  General: No apparent distress alert and oriented x3 mood and affect normal, dressed appropriately.  HEENT: Pupils equal, extraocular movements intact  Respiratory: Patient's speak in full sentences and does not  appear short of breath  Cardiovascular: No lower extremity edema, non tender, no erythema  Skin: Warm dry intact with no signs of infection or rash on extremities or on axial skeleton.  Abdomen: Soft nontender  Neuro: Cranial nerves II through XII are intact, neurovascularly intact in all extremities with 2+ DTRs and 2+ pulses.  Lymph: No lymphadenopathy of posterior or anterior cervical chain or axillae bilaterally.  Gait Antalgic gait  MSK:  Non tender with full range of motion and good stability and symmetric strength and  tone of shoulders, elbows, , and ankles bilaterally.  Arthritic changes of multiple joints  Back Exam:  Inspection: Unremarkable patient does have what appears to be a burn from a heating pad with no signs of infection. Motion: Flexion 15 deg with radiation going down the left leg posteriorly Extension 25 deg, Side Bending to 35 deg bilaterally,  Rotation to 35 deg bilaterally  SLR laying: Positive straight leg test XSLR laying: Negative  Palpable tenderness: Severely tender to palpation in deep musculature of L4-S1 on the left side FABER: Positive bilaterally Sensory change: Gross sensation intact to all lumbar and sacral dermatomes.  Reflexes: 2+ at both patellar tendons, 2+ at achilles tendons, Babinski's downgoing.  Strength at foot  4-5 strength for seems to be symmetric.       Impression and Recommendations:     This case required medical decision making of moderate complexity.

## 2016-01-18 ENCOUNTER — Ambulatory Visit (INDEPENDENT_AMBULATORY_CARE_PROVIDER_SITE_OTHER): Payer: Medicare Other | Admitting: Family Medicine

## 2016-01-18 ENCOUNTER — Ambulatory Visit (INDEPENDENT_AMBULATORY_CARE_PROVIDER_SITE_OTHER)
Admission: RE | Admit: 2016-01-18 | Discharge: 2016-01-18 | Disposition: A | Discharge status: Home / Self Care | Payer: Medicare Other | Source: Ambulatory Visit | Attending: Family Medicine | Admitting: Family Medicine

## 2016-01-18 ENCOUNTER — Encounter: Payer: Self-pay | Admitting: Family Medicine

## 2016-01-18 VITALS — BP 128/82 | HR 72 | Wt 210.0 lb

## 2016-01-18 DIAGNOSIS — M25552 Pain in left hip: Secondary | ICD-10-CM | POA: Diagnosis not present

## 2016-01-18 DIAGNOSIS — M5416 Radiculopathy, lumbar region: Secondary | ICD-10-CM | POA: Insufficient documentation

## 2016-01-18 DIAGNOSIS — M1612 Unilateral primary osteoarthritis, left hip: Secondary | ICD-10-CM | POA: Diagnosis not present

## 2016-01-18 MED ORDER — KETOROLAC TROMETHAMINE 60 MG/2ML IM SOLN
60.0000 mg | Freq: Once | INTRAMUSCULAR | Status: AC
  Administered 2016-01-18: 60 mg via INTRAMUSCULAR

## 2016-01-18 MED ORDER — METHYLPREDNISOLONE ACETATE 80 MG/ML IJ SUSP
80.0000 mg | Freq: Once | INTRAMUSCULAR | Status: AC
  Administered 2016-01-18: 80 mg via INTRAMUSCULAR

## 2016-01-18 MED ORDER — PREDNISONE 50 MG PO TABS: 50.0000 mg | ORAL_TABLET | Freq: Every day | ORAL | 0 refills | Status: DC

## 2016-01-18 MED ORDER — GABAPENTIN 100 MG PO CAPS: 200.0000 mg | ORAL_CAPSULE | Freq: Every day | ORAL | 3 refills | Status: DC

## 2016-01-18 NOTE — Patient Instructions (Addendum)
Good to see you You only have mild arthritis of the hip, this is your back.  2 injections today  Starting tonight gabapentin 200mg  nightly Starting tomorrow prednisone daily for 5 days.   Heat 20 minutes then ice 20 minutes then off for 2 hours and repeat as much as you want.  Xray downstairs See me again in 2 weeks or call me if worsening and we may need an MRI.

## 2016-01-18 NOTE — Assessment & Plan Note (Signed)
Patient is having more of a lumbar radiculopathy. X-rays ordered today. We discussed icing regimen and patient given prednisone and gabapentin. 2 injections in today to decrease inflammation. Patient knows if any worsening symptoms to seek medical attention immediately. We discussed the differential being also possibly compression fracture and we will monitor x-rays for further evaluation. Following up again in 1-2 weeks for further evaluation.

## 2016-02-08 ENCOUNTER — Telehealth: Payer: Self-pay | Admitting: *Deleted

## 2016-02-08 ENCOUNTER — Other Ambulatory Visit: Payer: Self-pay | Admitting: Internal Medicine

## 2016-02-08 NOTE — Telephone Encounter (Signed)
He needs a UDS first

## 2016-02-08 NOTE — Telephone Encounter (Signed)
Rec'd call pt requesting refill on his Oxycodone.../lmb 

## 2016-02-08 NOTE — Telephone Encounter (Signed)
Notified pt made appt for 10/10 @ 9:00...Johny Chess

## 2016-02-10 ENCOUNTER — Telehealth: Payer: Self-pay | Admitting: Internal Medicine

## 2016-02-10 ENCOUNTER — Encounter: Payer: Self-pay | Admitting: Internal Medicine

## 2016-02-10 DIAGNOSIS — M159 Polyosteoarthritis, unspecified: Secondary | ICD-10-CM

## 2016-02-10 DIAGNOSIS — Z79891 Long term (current) use of opiate analgesic: Secondary | ICD-10-CM | POA: Diagnosis not present

## 2016-02-10 MED ORDER — OXYCODONE-ACETAMINOPHEN 10-325 MG PO TABS: 1.0000 | ORAL_TABLET | Freq: Three times a day (TID) | ORAL | 0 refills | Status: DC | PRN

## 2016-02-10 NOTE — Telephone Encounter (Signed)
Reviewed note from 02/08/16 and agree with UDS @ pickup. Rx for 6 pills til visit with PCP. Reviewed narcotic database and no early fills or other rxs from other provider.

## 2016-02-10 NOTE — Telephone Encounter (Signed)
Tried to call pt twice. No answer and no vm picked up.  Called mobile and pt is coming in.   UDS required written on envelope.

## 2016-02-10 NOTE — Telephone Encounter (Signed)
Is requesting 4 pain pills until appt on Tuesday.  Told patient Ronnald Ramp would not be back in until Monday.  Patient is requesting to see if another provider would be willing to do this.

## 2016-02-14 ENCOUNTER — Encounter: Payer: Self-pay | Admitting: Internal Medicine

## 2016-02-14 ENCOUNTER — Ambulatory Visit (INDEPENDENT_AMBULATORY_CARE_PROVIDER_SITE_OTHER): Payer: Medicare Other | Admitting: Internal Medicine

## 2016-02-14 VITALS — BP 150/74 | HR 88 | Temp 98.4°F | Resp 16 | Ht 69.0 in | Wt 215.0 lb

## 2016-02-14 DIAGNOSIS — I10 Essential (primary) hypertension: Secondary | ICD-10-CM

## 2016-02-14 DIAGNOSIS — M159 Polyosteoarthritis, unspecified: Secondary | ICD-10-CM

## 2016-02-14 DIAGNOSIS — Z23 Encounter for immunization: Secondary | ICD-10-CM

## 2016-02-14 DIAGNOSIS — Z2911 Encounter for prophylactic immunotherapy for respiratory syncytial virus (RSV): Secondary | ICD-10-CM | POA: Diagnosis not present

## 2016-02-14 MED ORDER — OXYCODONE-ACETAMINOPHEN 10-325 MG PO TABS: 1.0000 | ORAL_TABLET | Freq: Three times a day (TID) | ORAL | 0 refills | Status: DC | PRN

## 2016-02-14 NOTE — Progress Notes (Signed)
Subjective:  Patient ID: Adrian Miller, male    DOB: 1940/08/18  Age: 75 y.o. MRN: [id number]  CC: Hypertension and Osteoarthritis   HPI Adrian Miller presents for a blood pressure check and follow-up on chronic pain related to osteoarthritis. He tells me he has pain that limits his activities in his large joints including both hips, both shoulders, and both wrists. His pain is well-controlled with Percocet that he takes about 3 times a day.  He tells me his blood pressure has been well controlled on the combination of amlodipine and chlorthalidone. He denies any recent episodes of headache, blurred vision, chest pain, shortness of breath, palpitations, edema, or fatigue.  Outpatient Medications Prior to Visit  Medication Sig Dispense Refill  . amLODipine (NORVASC) 5 MG tablet Take 1 tablet (5 mg total) by mouth daily. 30 tablet 0  . atorvastatin (LIPITOR) 20 MG tablet Take 1 tablet (20 mg total) by mouth daily. 90 tablet 3  . chlorthalidone (HYGROTON) 25 MG tablet Take 1 tablet (25 mg total) by mouth daily. 90 tablet 1  . cyanocobalamin 2000 MCG tablet Take 1 tablet (2,000 mcg total) by mouth daily. 90 tablet 3  . ferrous sulfate 325 (65 FE) MG tablet Take 1 tablet (325 mg total) by mouth 3 (three) times daily with meals. (Patient taking differently: Take 325 mg by mouth 2 (two) times daily with a meal. ) 90 tablet 5  . furosemide (LASIX) 20 MG tablet Take 1 tablet (20 mg total) by mouth daily as needed for edema. 30 tablet 3  . gabapentin (NEURONTIN) 100 MG capsule Take 2 capsules (200 mg total) by mouth at bedtime. 60 capsule 3  . pantoprazole (PROTONIX) 40 MG tablet Take 1 tablet (40 mg total) by mouth daily. 30 tablet 0  . acetaminophen (TYLENOL) 500 MG tablet Take 1,000 mg by mouth every 8 (eight) hours as needed for mild pain or moderate pain.    . nicotine (NICODERM CQ - DOSED IN MG/24 HOURS) 21 mg/24hr patch Place 1 patch (21 mg total) onto the skin daily. 28 patch 0  .  oxyCODONE-acetaminophen (PERCOCET) 10-325 MG tablet Take 1 tablet by mouth every 8 (eight) hours as needed for pain. 6 tablet 0  . predniSONE (DELTASONE) 50 MG tablet Take 1 tablet (50 mg total) by mouth daily. 5 tablet 0   No facility-administered medications prior to visit.     ROS Review of Systems  Constitutional: Negative.  Negative for appetite change, chills, fatigue and unexpected weight change.  HENT: Negative.   Eyes: Negative.  Negative for visual disturbance.  Respiratory: Negative for cough, choking, chest tightness, shortness of breath and stridor.   Cardiovascular: Negative.  Negative for chest pain, palpitations and leg swelling.  Gastrointestinal: Negative.  Negative for abdominal pain, constipation, diarrhea, nausea and vomiting.  Endocrine: Negative.   Genitourinary: Negative.   Musculoskeletal: Positive for arthralgias. Negative for joint swelling, myalgias and neck stiffness.  Skin: Negative.   Allergic/Immunologic: Negative.   Neurological: Negative.  Negative for dizziness, tremors, weakness, numbness and headaches.  Hematological: Negative.  Negative for adenopathy. Does not bruise/bleed easily.  Psychiatric/Behavioral: Negative.     Objective:  BP (!) 150/74 (BP Location: Left Arm, Patient Position: Sitting, Cuff Size: Normal)   Pulse 88   Temp 98.4 F (36.9 C) (Oral)   Resp 16   Ht 5\' 9"  (1.753 m)   Wt 215 lb (97.5 kg)   SpO2 96%   BMI 31.75 kg/m   BP Readings  from Last 3 Encounters:  02/14/16 (!) 150/74  01/18/16 128/82  12/28/15 140/80    Wt Readings from Last 3 Encounters:  02/14/16 215 lb (97.5 kg)  01/18/16 210 lb (95.3 kg)  12/28/15 218 lb (98.9 kg)    Physical Exam  Constitutional: He is oriented to person, place, and time. No distress.  HENT:  Mouth/Throat: Oropharynx is clear and moist. No oropharyngeal exudate.  Eyes: Conjunctivae are normal. Right eye exhibits no discharge. Left eye exhibits no discharge. No scleral icterus.    Neck: Normal range of motion. Neck supple. No JVD present. No tracheal deviation present. No thyromegaly present.  Cardiovascular: Normal rate, regular rhythm, normal heart sounds and intact distal pulses.  Exam reveals no gallop and no friction rub.   No murmur heard. Pulmonary/Chest: Effort normal and breath sounds normal. No stridor. No respiratory distress. He has no wheezes. He has no rales. He exhibits no tenderness.  Abdominal: Soft. Bowel sounds are normal. He exhibits no distension and no mass. There is no tenderness. There is no rebound and no guarding.  Musculoskeletal: Normal range of motion. He exhibits no edema, tenderness or deformity.  Lymphadenopathy:    He has no cervical adenopathy.  Neurological: He is oriented to person, place, and time.  Skin: Skin is warm and dry. No rash noted. He is not diaphoretic. No erythema. No pallor.  Vitals reviewed.   Lab Results  Component Value Date   WBC 10.7 (H) 12/19/2015   HGB 14.4 12/19/2015   HCT 42.5 12/19/2015   PLT 325 12/19/2015   GLUCOSE 88 12/26/2015   CHOL 143 12/26/2015   TRIG 172.0 (H) 12/26/2015   HDL 35.00 (L) 12/26/2015   LDLCALC 74 12/26/2015   ALT 23 12/26/2015   AST 14 12/26/2015   NA 139 12/26/2015   K 4.1 12/26/2015   CL 104 12/26/2015   CREATININE 0.99 12/26/2015   BUN 13 12/26/2015   CO2 26 12/26/2015   TSH 0.76 12/26/2015   PSA 0.87 03/18/2014   INR 1.08 02/11/2015   HGBA1C 5.7 (H) 12/19/2015    Dg Hip Unilat With Pelvis 2-3 Views Left  Result Date: 01/18/2016 CLINICAL DATA:  Left hip pain and groin area for 5 days. Pain radiates down left leg. No known injury. EXAM: DG HIP (WITH OR WITHOUT PELVIS) 2-3V LEFT COMPARISON:  None. FINDINGS: Mild degenerative changes with joint space narrowing and spurring. Sick No acute bony abnormality. Specifically, no fracture, subluxation, or dislocation. Soft tissues are intact. IMPRESSION: No acute bony abnormality.  Mild degenerative changes. Electronically  Signed   By: Adrian Miller M.D.   On: 01/18/2016 09:36    Assessment & Plan:   [name] was seen today for hypertension and osteoarthritis.  Diagnoses and all orders for this visit:  Benign essential HTN- his blood pressure is well-controlled, recent electrolytes and renal function are stable.  Generalized OA- will continue Percocet as needed for pain control, he tells me he will soon have a right total hip replacement. -     Discontinue: oxyCODONE-acetaminophen (PERCOCET) 10-325 MG tablet; Take 1 tablet by mouth every 8 (eight) hours as needed for pain. -     Discontinue: oxyCODONE-acetaminophen (PERCOCET) 10-325 MG tablet; Take 1 tablet by mouth every 8 (eight) hours as needed for pain. -     Discontinue: oxyCODONE-acetaminophen (PERCOCET) 10-325 MG tablet; Take 1 tablet by mouth every 8 (eight) hours as needed for pain. -     Discontinue: oxyCODONE-acetaminophen (PERCOCET) 10-325 MG tablet; Take 1 tablet  by mouth every 8 (eight) hours as needed for pain. -     oxyCODONE-acetaminophen (PERCOCET) 10-325 MG tablet; Take 1 tablet by mouth every 8 (eight) hours as needed for pain.  Need for shingles vaccine -     Varicella-zoster vaccine subcutaneous   I have discontinued Mr. Hegwood's acetaminophen, nicotine, and predniSONE. I am also having him maintain his ferrous sulfate, cyanocobalamin, amLODipine, pantoprazole, chlorthalidone, atorvastatin, furosemide, gabapentin, and oxyCODONE-acetaminophen.  Meds ordered this encounter  Medications  . DISCONTD: oxyCODONE-acetaminophen (PERCOCET) 10-325 MG tablet    Sig: Take 1 tablet by mouth every 8 (eight) hours as needed for pain.    Dispense:  6 tablet    Refill:  0    Fill on of after 02/14/16  . DISCONTD: oxyCODONE-acetaminophen (PERCOCET) 10-325 MG tablet    Sig: Take 1 tablet by mouth every 8 (eight) hours as needed for pain.    Dispense:  90 tablet    Refill:  0    Fill on of after 02/14/16  . DISCONTD: oxyCODONE-acetaminophen (PERCOCET)  10-325 MG tablet    Sig: Take 1 tablet by mouth every 8 (eight) hours as needed for pain.    Dispense:  90 tablet    Refill:  0    Fill on of after 03/16/16  . DISCONTD: oxyCODONE-acetaminophen (PERCOCET) 10-325 MG tablet    Sig: Take 1 tablet by mouth every 8 (eight) hours as needed for pain.    Dispense:  90 tablet    Refill:  0    Fill on of after 04/15/16  . oxyCODONE-acetaminophen (PERCOCET) 10-325 MG tablet    Sig: Take 1 tablet by mouth every 8 (eight) hours as needed for pain.    Dispense:  90 tablet    Refill:  0    Fill on of after 05/16/16     Follow-up: Return in about 4 months (around 06/16/2016).  Scarlette Calico, MD

## 2016-02-14 NOTE — Progress Notes (Signed)
Pre visit review using our clinic review tool, if applicable. No additional management support is needed unless otherwise documented below in the visit note. 

## 2016-02-14 NOTE — Patient Instructions (Signed)

## 2016-06-15 ENCOUNTER — Encounter: Payer: Self-pay | Admitting: Family Medicine

## 2016-06-15 ENCOUNTER — Other Ambulatory Visit: Payer: Self-pay | Admitting: Internal Medicine

## 2016-06-15 ENCOUNTER — Telehealth: Payer: Self-pay | Admitting: Emergency Medicine

## 2016-06-15 ENCOUNTER — Ambulatory Visit (INDEPENDENT_AMBULATORY_CARE_PROVIDER_SITE_OTHER): Payer: Medicare Other | Admitting: Family Medicine

## 2016-06-15 VITALS — BP 160/80 | HR 74 | Ht 69.0 in | Wt 215.0 lb

## 2016-06-15 DIAGNOSIS — M12811 Other specific arthropathies, not elsewhere classified, right shoulder: Secondary | ICD-10-CM | POA: Diagnosis not present

## 2016-06-15 DIAGNOSIS — M1611 Unilateral primary osteoarthritis, right hip: Secondary | ICD-10-CM | POA: Diagnosis not present

## 2016-06-15 DIAGNOSIS — G8929 Other chronic pain: Secondary | ICD-10-CM

## 2016-06-15 DIAGNOSIS — M159 Polyosteoarthritis, unspecified: Secondary | ICD-10-CM

## 2016-06-15 DIAGNOSIS — M75101 Unspecified rotator cuff tear or rupture of right shoulder, not specified as traumatic: Secondary | ICD-10-CM

## 2016-06-15 DIAGNOSIS — M25511 Pain in right shoulder: Secondary | ICD-10-CM

## 2016-06-15 MED ORDER — KETOROLAC TROMETHAMINE 60 MG/2ML IM SOLN
60.0000 mg | Freq: Once | INTRAMUSCULAR | Status: AC
Start: 2016-06-15 — End: 2016-06-15
  Administered 2016-06-15: 60 mg via INTRAMUSCULAR

## 2016-06-15 MED ORDER — OXYCODONE-ACETAMINOPHEN 10-325 MG PO TABS: 1.0000 | ORAL_TABLET | Freq: Three times a day (TID) | ORAL | 0 refills | Status: DC | PRN

## 2016-06-15 MED ORDER — METHYLPREDNISOLONE ACETATE 80 MG/ML IJ SUSP
80.0000 mg | Freq: Once | INTRAMUSCULAR | Status: AC
  Administered 2016-06-15: 80 mg via INTRAMUSCULAR

## 2016-06-15 NOTE — Telephone Encounter (Signed)
PCP gave rx to pt.

## 2016-06-15 NOTE — Assessment & Plan Note (Signed)
Repeat injection given. The last one was approximately 1 year ago. We discussed icing regimen and home exercises. Patient has significant arthritic changes of multiple joints. Discussed that we cannot do multiple joints at one time. Was given Toradol and Depo-Medrol for the rest of his aches and pains. Follow-up again in 2 weeks and we'll see how patient is responding.

## 2016-06-15 NOTE — Assessment & Plan Note (Signed)
Worsening pain. May need another injection. We discussed icing regimen. Patient come back again in 2 weeks and if continuing pain we'll consider injection

## 2016-06-15 NOTE — Telephone Encounter (Signed)
Pt came in and asked to get a refill on his oxyCODONE-acetaminophen (PERCOCET) 10-325 MG tablet. Please advise thanks.

## 2016-06-15 NOTE — Progress Notes (Signed)
[name] [address], [place] [address] Phone: [phone] Subjective:     CC:  Pain all over  [id number]  Adrian Miller is a 76 y.o. male coming in with complaint of left hip pain. Patient has known arthritic changes of the right hip. Started having worsening pain of the right hip. States that the right shoulder though seems to be the worse. Having difficulty raising it. States that the pain seems to radiate down his arm some. Waking him up at night. Pain medications as not helping as much. Patient is tried icing with no significant benefit. States that his left hand is also hurting. Has had a bad fracture of this wrist he states. States that just everything seems to be hurting.    Past Medical History:  Diagnosis Date  . Acute esophagitis 06/01/2015  . Benign essential HTN 05/31/2015  . Duodenal ulcer hemorrhage   . Esophageal stricture 06/01/2015  . Hiatal hernia 06/01/2015   Past Surgical History:  Procedure Laterality Date  . ESOPHAGOGASTRODUODENOSCOPY N/A 05/31/2015   Procedure: ESOPHAGOGASTRODUODENOSCOPY (EGD);  Surgeon: Ladene Artist, MD;  Location: [facility];  Service: Endoscopy;  Laterality: N/A;  . HERNIA REPAIR  1975  . LEFT ROTATOR CUFF REPAIR  2002  . REPLACEMENT TOTAL KNEE Left   . TOTAL KNEE ARTHROPLASTY Right 02/22/2015   Procedure: TOTAL KNEE ARTHROPLASTY;  Surgeon: Melrose Nakayama, MD;  Location: [facility];  Service: Orthopedics;  Laterality: Right;   Social History   Social History  . Marital status: Single    Spouse name: N/A  . Number of children: N/A  . Years of education: N/A   Occupational History  . Not on file.   Social History Main Topics  . Smoking status: Current Every Day Smoker    Packs/day: 0.50    Years: 51.00    Types: Cigarettes  . Smokeless tobacco: Former Systems developer    Quit date: 11/03/2014     Comment: quit 2 weeks ago  . Alcohol use No  . Drug use: No  . Sexual activity: No   Other Topics  Concern  . Not on file   Social History Narrative  . No narrative on file   Allergies  Allergen Reactions  . Asa [Aspirin]     angioedema   Family History  Problem Relation Age of Onset  . Hypertension Mother   . Heart attack Sister    no family history of rheumatological diseases.  Past medical history, social, surgical and family history all reviewed in electronic medical record.   Review of Systems: No headache, visual changes, nausea, vomiting, diarrhea, constipation, dizziness, abdominal pain, skin rash, fevers, chills, night sweats, weight loss, swollen lymph nodes Positive for muscle aches, body aches, joint swelling, denies though any chest pain or any shortness of breath s.   Objective  Blood pressure (!) 160/80, pulse 74, height 5\' 9"  (1.753 m), weight 215 lb (97.5 kg).  Systems examined below as of 06/15/16 General: NAD A&O x3 mood, affect normal  HEENT: Pupils equal, extraocular movements intact no nystagmus Respiratory: not short of breath at rest or with speaking Cardiovascular: No lower extremity edema, non tender Skin: Warm dry intact with no signs of infection or rash on extremities or on axial skeleton. Abdomen: Soft nontender, no masses Neuro: Cranial nerves  intact, neurovascularly intact in all extremities with 2+ DTRs and 2+ pulses. Lymph: No lymphadenopathy appreciated today  GaitAntalgic gait MSK: Arthritic changes of multiple joints pain to  palpation in almost every joint.  Shoulder: Right Atrophy of musculature compared to the contralateral side Lacks last 10 of forward flexion, only 5 of external rotation Rotator cuff strength 3 out of 5 Positive impingement Speeds and Yergason's tests normal. No labral pathology noted with negative Obrien's, negative clunk and good stability. Normal scapular function observed. Positive painful arc No apprehension sign Contralateral shoulder has some crepitus and mild range of motion that is lacking but  overall good strength  Hip exam right minimal internal ROM. Patient does have mild radicular symptoms as well. Significant tightness of rotation.   After informed written and verbal consent, patient was seated on exam table. Right shoulder was prepped with alcohol swab and utilizing posterior approach, patient's right glenohumeral space was injected with 4:1  marcaine 0.5%: Kenalog 40mg /dL. Patient tolerated the procedure well without immediate complications.       Impression and Recommendations:     This case required medical decision making of moderate complexity.

## 2016-06-15 NOTE — Patient Instructions (Signed)
Good to see you  Adrian Miller is your friend.  We injected the right shoulder If not better see me again in 2 weeks and if not better lets do the hip and thumb

## 2016-06-15 NOTE — Telephone Encounter (Signed)
RX written 

## 2016-07-09 ENCOUNTER — Telehealth: Payer: Self-pay

## 2016-07-09 ENCOUNTER — Other Ambulatory Visit: Payer: Self-pay | Admitting: Internal Medicine

## 2016-07-09 NOTE — Telephone Encounter (Signed)
LMOM for patient to CB or pick up Handicap placard from office

## 2016-07-09 NOTE — Telephone Encounter (Signed)
Disability Form for [employer] time Handicap placard is being requested.

## 2016-09-17 ENCOUNTER — Ambulatory Visit: Payer: Medicare Other | Admitting: Nurse Practitioner

## 2016-09-27 DIAGNOSIS — M47816 Spondylosis without myelopathy or radiculopathy, lumbar region: Secondary | ICD-10-CM | POA: Diagnosis not present

## 2016-09-27 DIAGNOSIS — M25511 Pain in right shoulder: Secondary | ICD-10-CM | POA: Diagnosis not present

## 2016-10-02 DIAGNOSIS — M47816 Spondylosis without myelopathy or radiculopathy, lumbar region: Secondary | ICD-10-CM | POA: Diagnosis not present

## 2016-10-13 NOTE — Progress Notes (Signed)
[facility] [address] [place], [place] [address] Phone: [phone] Subjective:    I'm seeing this patient by the request  of:    CC: Left wrist pain  [id number]  Adrian Miller is a 76 y.o. male coming in with complaint of left wrist pain. Patient has many different chronic musculoskeletal complaints. States that it is worsening symptoms. Patient states that unfortunately left hand seems to be worsening. States that it is normal the time. Pain with even regular daily activities. Difficulty with gripping. Noticing weakness.      Past Medical History:  Diagnosis Date  . Acute esophagitis 06/01/2015  . Benign essential HTN 05/31/2015  . Duodenal ulcer hemorrhage   . Esophageal stricture 06/01/2015  . Hiatal hernia 06/01/2015   Past Surgical History:  Procedure Laterality Date  . ESOPHAGOGASTRODUODENOSCOPY N/A 05/31/2015   Procedure: ESOPHAGOGASTRODUODENOSCOPY (EGD);  Surgeon: Ladene Artist, MD;  Location: [facility];  Service: Endoscopy;  Laterality: N/A;  . HERNIA REPAIR  1975  . LEFT ROTATOR CUFF REPAIR  2002  . REPLACEMENT TOTAL KNEE Left   . TOTAL KNEE ARTHROPLASTY Right 02/22/2015   Procedure: TOTAL KNEE ARTHROPLASTY;  Surgeon: Melrose Nakayama, MD;  Location: [facility];  Service: Orthopedics;  Laterality: Right;   Social History   Social History  . Marital status: Single    Spouse name: N/A  . Number of children: N/A  . Years of education: N/A   Social History Main Topics  . Smoking status: Current Every Day Smoker    Packs/day: 0.50    Years: 51.00    Types: Cigarettes  . Smokeless tobacco: Former Systems developer    Quit date: 11/03/2014     Comment: quit 2 weeks ago  . Alcohol use No  . Drug use: No  . Sexual activity: No   Other Topics Concern  . Not on file   Social History Narrative  . No narrative on file   Allergies  Allergen Reactions  . Asa [Aspirin]     angioedema   Family History  Problem Relation Age of Onset  .  Hypertension Mother   . Heart attack Sister     Past medical history, social, surgical and family history all reviewed in electronic medical record.  No pertanent information unless stated regarding to the chief complaint.   Review of Systems:Review of systems updated and as accurate as of 10/13/16  No headache, visual changes, nausea, vomiting, diarrhea, constipation, dizziness, abdominal pain, skin rash, fevers, chills, night sweats, weight loss, swollen lymph nodes, chest pain, shortness of breath, mood changes. Positive for muscle aches, joint swelling, body aches,  Objective  There were no vitals taken for this visit. Systems examined below as of 10/13/16   General: No apparent distress alert and oriented x3 mood and affect normal, dressed appropriately.  HEENT: Pupils equal, extraocular movements intact  Respiratory: Patient's speak in full sentences and does not appear short of breath  Cardiovascular: No lower extremity edema, non tender, no erythema  Skin: Warm dry intact with no signs of infection or rash on extremities or on axial skeleton.  Abdomen: Soft nontender  Neuro: Cranial nerves II through XII are intact, neurovascularly intact in all extremities with 2+ DTRs and 2+ pulses.  Lymph: No lymphadenopathy of posterior or anterior cervical chain or axillae bilaterally.  Gait Chronic antalgic gait MSK:  Mild tender with full range of motion and good stability and symmetric strength and tone of shoulders, elbows,  hip, knee and  ankles bilaterally. Significant arthritic changes of multiple jointsPatient does have knee arthroplasty done previously Wrist: Left Inspection shows mild thenar eminence wasting compared to contralateral sign ROM smooth and normal with good flexion and extension and ulnar/radial deviation that is symmetrical with opposite wrist. Palpation is normal over metacarpals, navicular, lunate, and TFCC; tendons without tenderness/ swelling No snuffbox  tenderness. No tenderness over Canal of Guyon. Mild weakness of 4 out of 5 strength C6 distribution. Negative Finkelstein, positive tinel's and phalens. Negative Watson's test.   Procedure: Real-time Ultrasound Guided Injection of left carpal tunnel Device: GE Logiq Q7 Ultrasound guided injection is preferred based studies that show increased duration, increased effect, greater accuracy, decreased procedural pain, increased response rate with ultrasound guided versus blind injection.  Verbal informed consent obtained.  Time-out conducted.  Noted no overlying erythema, induration, or other signs of local infection.  Skin prepped in a sterile fashion.  Local anesthesia: Topical Ethyl chloride.  With sterile technique and under real time ultrasound guidance:  median nerve visualized.  23g 5/8 inch needle inserted distal to proximal approach into nerve sheath. Pictures taken nfor needle placement. Patient did have injection of 2 cc of 0.5% Marcaine, and 1 cc of Kenalog 40 mg/dL. Completed without difficulty  Pain immediately resolved suggesting accurate placement of the medication.  Advised to call if fevers/chills, erythema, induration, drainage, or persistent bleeding.  Images permanently stored and available for review in the ultrasound unit.  Impression: Technically successful ultrasound guided injection.    Impression and Recommendations:     This case required medical decision making of moderate complexity.      Note: This dictation was prepared with Dragon dictation along with smaller phrase technology. Any transcriptional errors that result from this process are unintentional.

## 2016-10-15 ENCOUNTER — Ambulatory Visit (INDEPENDENT_AMBULATORY_CARE_PROVIDER_SITE_OTHER): Payer: Medicare Other | Admitting: Family Medicine

## 2016-10-15 ENCOUNTER — Other Ambulatory Visit: Payer: Self-pay | Admitting: Internal Medicine

## 2016-10-15 ENCOUNTER — Encounter: Payer: Self-pay | Admitting: Family Medicine

## 2016-10-15 ENCOUNTER — Ambulatory Visit: Payer: Self-pay

## 2016-10-15 VITALS — BP 144/86 | HR 62

## 2016-10-15 DIAGNOSIS — M79642 Pain in left hand: Secondary | ICD-10-CM

## 2016-10-15 DIAGNOSIS — G5602 Carpal tunnel syndrome, left upper limb: Secondary | ICD-10-CM

## 2016-10-15 DIAGNOSIS — M159 Polyosteoarthritis, unspecified: Secondary | ICD-10-CM

## 2016-10-15 MED ORDER — OXYCODONE-ACETAMINOPHEN 10-325 MG PO TABS: 1.0000 | ORAL_TABLET | Freq: Three times a day (TID) | ORAL | 0 refills | Status: DC | PRN

## 2016-10-15 NOTE — Patient Instructions (Addendum)
Good to see you  Good luck at the club.  Hope the injection helps.  You know where I am if you need me.

## 2016-10-15 NOTE — Assessment & Plan Note (Signed)
Patient given injection today. Tolerated procedure well. Encourage him to take gabapentin on a more regular basis. We discussed icing regimen. Worsening we can repeat injection every 3 months if necessary. Patient will follow-up with me again in 4-6 weeks if no improvement.

## 2016-11-23 ENCOUNTER — Telehealth: Payer: Self-pay | Admitting: Internal Medicine

## 2016-11-23 NOTE — Telephone Encounter (Signed)
Check [place] registry last filled 10/19/2016 @ [facility], last UDA 02/10/2016. MD put of office will hold for his approval when he return [facility] Monday.Marland KitchenJohny Chess

## 2016-11-23 NOTE — Telephone Encounter (Signed)
He is due for an OV with UDS

## 2016-11-23 NOTE — Telephone Encounter (Signed)
Pt called requesting a refill on oxyCODONE-acetaminophen (PERCOCET) 10-325 MG tablet. He would like a call when this is ready to be picked up.

## 2016-11-23 NOTE — Telephone Encounter (Signed)
Notified pt w/MD response. Made appt for Monday 11/26/16 @ 9:00.Marland KitchenJohny Chess

## 2016-11-26 ENCOUNTER — Other Ambulatory Visit (INDEPENDENT_AMBULATORY_CARE_PROVIDER_SITE_OTHER): Payer: Medicare Other

## 2016-11-26 ENCOUNTER — Encounter: Payer: Self-pay | Admitting: Internal Medicine

## 2016-11-26 ENCOUNTER — Ambulatory Visit (INDEPENDENT_AMBULATORY_CARE_PROVIDER_SITE_OTHER): Payer: Medicare Other | Admitting: Internal Medicine

## 2016-11-26 VITALS — BP 160/80 | HR 57 | Temp 98.9°F | Resp 12 | Ht 69.0 in | Wt 210.0 lb

## 2016-11-26 DIAGNOSIS — M5416 Radiculopathy, lumbar region: Secondary | ICD-10-CM

## 2016-11-26 DIAGNOSIS — D508 Other iron deficiency anemias: Secondary | ICD-10-CM | POA: Diagnosis not present

## 2016-11-26 DIAGNOSIS — I10 Essential (primary) hypertension: Secondary | ICD-10-CM | POA: Diagnosis not present

## 2016-11-26 DIAGNOSIS — D510 Vitamin B12 deficiency anemia due to intrinsic factor deficiency: Secondary | ICD-10-CM

## 2016-11-26 DIAGNOSIS — R001 Bradycardia, unspecified: Secondary | ICD-10-CM

## 2016-11-26 DIAGNOSIS — E785 Hyperlipidemia, unspecified: Secondary | ICD-10-CM | POA: Diagnosis not present

## 2016-11-26 DIAGNOSIS — M159 Polyosteoarthritis, unspecified: Secondary | ICD-10-CM | POA: Diagnosis not present

## 2016-11-26 DIAGNOSIS — Z79891 Long term (current) use of opiate analgesic: Secondary | ICD-10-CM | POA: Diagnosis not present

## 2016-11-26 LAB — CBC WITH DIFFERENTIAL/PLATELET
Basophils Absolute: 0 10*3/uL (ref 0.0–0.1)
Basophils Relative: 0.4 % (ref 0.0–3.0)
Eosinophils Absolute: 0.2 10*3/uL (ref 0.0–0.7)
Eosinophils Relative: 2.3 % (ref 0.0–5.0)
HCT: 45.4 % (ref 39.0–52.0)
Hemoglobin: 15.4 g/dL (ref 13.0–17.0)
Lymphocytes Relative: 18.4 % (ref 12.0–46.0)
Lymphs Abs: 1.7 10*3/uL (ref 0.7–4.0)
MCHC: 33.9 g/dL (ref 30.0–36.0)
MCV: 92.5 fl (ref 78.0–100.0)
Monocytes Absolute: 0.6 10*3/uL (ref 0.1–1.0)
Monocytes Relative: 6.9 % (ref 3.0–12.0)
Neutro Abs: 6.7 10*3/uL (ref 1.4–7.7)
Neutrophils Relative %: 72 % (ref 43.0–77.0)
Platelets: 328 10*3/uL (ref 150.0–400.0)
RBC: 4.91 Mil/uL (ref 4.22–5.81)
RDW: 14.2 % (ref 11.5–15.5)
WBC: 9.4 10*3/uL (ref 4.0–10.5)

## 2016-11-26 LAB — LIPID PANEL
Cholesterol: 134 mg/dL (ref 0–200)
HDL: 30.5 mg/dL — ABNORMAL LOW (ref >39.00)
LDL Cholesterol: 67 mg/dL (ref 0–99)
NonHDL: 103.35
Total CHOL/HDL Ratio: 4
Triglycerides: 183 mg/dL — ABNORMAL HIGH (ref 0.0–149.0)
VLDL: 36.6 mg/dL (ref 0.0–40.0)

## 2016-11-26 LAB — COMPREHENSIVE METABOLIC PANEL
ALT: 11 U/L (ref 0–53)
AST: 12 U/L (ref 0–37)
Albumin: 4.2 g/dL (ref 3.5–5.2)
Alkaline Phosphatase: 96 U/L (ref 39–117)
BUN: 10 mg/dL (ref 6–23)
CO2: 28 mEq/L (ref 19–32)
Calcium: 9.3 mg/dL (ref 8.4–10.5)
Chloride: 104 mEq/L (ref 96–112)
Creatinine, Ser: 0.96 mg/dL (ref 0.40–1.50)
GFR: 98.05 mL/min (ref >60.00)
Glucose, Bld: 123 mg/dL — ABNORMAL HIGH (ref 70–99)
Potassium: 3.9 mEq/L (ref 3.5–5.1)
Sodium: 139 mEq/L (ref 135–145)
Total Bilirubin: 0.3 mg/dL (ref 0.2–1.2)
Total Protein: 7.3 g/dL (ref 6.0–8.3)

## 2016-11-26 MED ORDER — OXYCODONE-ACETAMINOPHEN 10-325 MG PO TABS: 1.0000 | ORAL_TABLET | Freq: Three times a day (TID) | ORAL | 0 refills | Status: DC | PRN

## 2016-11-26 MED ORDER — IRBESARTAN 300 MG PO TABS: 300.0000 mg | ORAL_TABLET | Freq: Every day | ORAL | 1 refills | Status: DC

## 2016-11-26 NOTE — Progress Notes (Signed)
Subjective:  Patient ID: Adrian Miller, male    DOB: 06-Jul-1940  Age: 76 y.o. MRN: [id number]  CC: Follow-up; Hyperlipidemia; Hypertension; Back Pain; Osteoarthritis; and Anemia   HPI Adrian Miller presents for f/up - He is concerned that his blood pressure has not been well controlled. He denies any recent episodes of headache/blurred vision/chest pain/shortness of breath/palpitations/dizziness/lightheadedness/near syncope/fatigue/edema.  Outpatient Medications Prior to Visit  Medication Sig Dispense Refill  . atorvastatin (LIPITOR) 20 MG tablet Take 1 tablet (20 mg total) by mouth daily. 90 tablet 3  . chlorthalidone (HYGROTON) 25 MG tablet Take 1 tablet (25 mg total) by mouth daily. 90 tablet 1  . cyanocobalamin 2000 MCG tablet Take 1 tablet (2,000 mcg total) by mouth daily. 90 tablet 3  . ferrous sulfate 325 (65 FE) MG tablet Take 1 tablet (325 mg total) by mouth 3 (three) times daily with meals. (Patient taking differently: Take 325 mg by mouth 2 (two) times daily with a meal. ) 90 tablet 5  . furosemide (LASIX) 20 MG tablet Take 1 tablet (20 mg total) by mouth daily as needed for edema. 30 tablet 3  . gabapentin (NEURONTIN) 100 MG capsule Take 2 capsules (200 mg total) by mouth at bedtime. 60 capsule 3  . pantoprazole (PROTONIX) 40 MG tablet Take 1 tablet (40 mg total) by mouth daily. 30 tablet 0  . amLODipine (NORVASC) 5 MG tablet TAKE 1 TABLET (5 MG TOTAL) BY MOUTH DAILY. 90 tablet 1  . oxyCODONE-acetaminophen (PERCOCET) 10-325 MG tablet Take 1 tablet by mouth every 8 (eight) hours as needed for pain. 90 tablet 0   No facility-administered medications prior to visit.     ROS Review of Systems  Constitutional: Negative.  Negative for appetite change, diaphoresis, fatigue and unexpected weight change.  HENT: Negative.   Eyes: Negative for visual disturbance.  Respiratory: Negative for cough, chest tightness, shortness of breath and wheezing.   Cardiovascular: Negative for chest  pain, palpitations and leg swelling.  Gastrointestinal: Negative for abdominal pain, constipation, diarrhea, nausea and vomiting.  Endocrine: Negative.  Negative for cold intolerance.  Genitourinary: Negative.  Negative for difficulty urinating.  Musculoskeletal: Positive for arthralgias and back pain. Negative for joint swelling, myalgias and neck pain.  Skin: Negative.   Neurological: Negative.  Negative for dizziness, syncope, weakness and light-headedness.  Hematological: Negative for adenopathy. Does not bruise/bleed easily.  Psychiatric/Behavioral: Negative.     Objective:  BP (!) 160/80 (BP Location: Left Arm, Patient Position: Sitting, Cuff Size: Normal)   Pulse (!) 57   Temp 98.9 F (37.2 C) (Oral)   Resp 12   Ht 5\' 9"  (1.753 m)   Wt 210 lb (95.3 kg)   SpO2 98%   BMI 31.01 kg/m   BP Readings from Last 3 Encounters:  11/26/16 (!) 160/80  10/15/16 (!) 144/86  06/15/16 (!) 160/80    Wt Readings from Last 3 Encounters:  11/26/16 210 lb (95.3 kg)  06/15/16 215 lb (97.5 kg)  02/14/16 215 lb (97.5 kg)    Physical Exam  Constitutional: He is oriented to person, place, and time. No distress.  HENT:  Mouth/Throat: Oropharynx is clear and moist. No oropharyngeal exudate.  Eyes: Conjunctivae are normal. Right eye exhibits no discharge. Left eye exhibits no discharge. No scleral icterus.  Neck: Normal range of motion. Neck supple. No JVD present. No thyromegaly present.  Cardiovascular: Regular rhythm and S2 normal.  Bradycardia present.  Exam reveals no gallop and no friction rub.   No  murmur heard. EKG ---  Marked sinus  Bradycardia  -  Negative T-waves  -Possible  Anterolateral  ischemia.   ABNORMAL - the bradycardia is new  Pulmonary/Chest: Effort normal and breath sounds normal. No respiratory distress. He has no wheezes. He has no rales. He exhibits no tenderness.  Abdominal: Soft. Bowel sounds are normal. He exhibits no distension and no mass. There is no  tenderness. There is no rebound and no guarding.  Musculoskeletal: Normal range of motion. He exhibits no edema, tenderness or deformity.  Lymphadenopathy:    He has no cervical adenopathy.  Neurological: He is alert and oriented to person, place, and time.  Skin: Skin is warm and dry. No rash noted. He is not diaphoretic. No erythema. No pallor.  Vitals reviewed.   Lab Results  Component Value Date   WBC 9.4 11/26/2016   HGB 15.4 11/26/2016   HCT 45.4 11/26/2016   PLT 328.0 11/26/2016   GLUCOSE 123 (H) 11/26/2016   CHOL 134 11/26/2016   TRIG 183.0 (H) 11/26/2016   HDL 30.50 (L) 11/26/2016   LDLCALC 67 11/26/2016   ALT 11 11/26/2016   AST 12 11/26/2016   NA 139 11/26/2016   K 3.9 11/26/2016   CL 104 11/26/2016   CREATININE 0.96 11/26/2016   BUN 10 11/26/2016   CO2 28 11/26/2016   TSH 1.42 11/26/2016   PSA 0.87 03/18/2014   INR 1.08 02/11/2015   HGBA1C 5.7 (H) 12/19/2015    Dg Hip Unilat With Pelvis 2-3 Views Left  Result Date: 01/18/2016 CLINICAL DATA:  Left hip pain and groin area for 5 days. Pain radiates down left leg. No known injury. EXAM: DG HIP (WITH OR WITHOUT PELVIS) 2-3V LEFT COMPARISON:  None. FINDINGS: Mild degenerative changes with joint space narrowing and spurring. Sick No acute bony abnormality. Specifically, no fracture, subluxation, or dislocation. Soft tissues are intact. IMPRESSION: No acute bony abnormality.  Mild degenerative changes. Electronically Signed   By: Adrian Miller M.D.   On: 01/18/2016 09:36    Assessment & Plan:   [name] was seen today for follow-up, hyperlipidemia, hypertension, back pain, osteoarthritis and anemia.  Diagnoses and all orders for this visit:  Benign essential HTN- his blood pressure is not adequately well controlled. Will stop this CCB due to bradycardia. Will change to an ARB for better blood pressure control. Will continue the thiazide diuretic. -     Comprehensive metabolic panel; Future -     EKG 12-Lead -      irbesartan (AVAPRO) 300 MG tablet; Take 1 tablet (300 mg total) by mouth daily.  Vitamin B12 deficiency anemia due to intrinsic factor deficiency -     CBC with Differential/Platelet; Future  Hyperlipidemia with target LDL less than 130- He has achieved his LDL goal is doing well on the statin. -     Lipid panel; Future  Other iron deficiency anemias- improvement noted, will continue iron replacement therapy. -     CBC with Differential/Platelet; Future  Left lumbar radiculopathy -     MR LUMBAR SPINE WO CONTRAST; Future -     Discontinue: oxyCODONE-acetaminophen (PERCOCET) 10-325 MG tablet; Take 1 tablet by mouth every 8 (eight) hours as needed for pain. -     oxyCODONE-acetaminophen (PERCOCET) 10-325 MG tablet; Take 1 tablet by mouth every 8 (eight) hours as needed for pain.  Bradycardia- he is asymptomatic with respect to this. His labs are negative for metabolic causes such as abnormal electrolytes or thyroid disease. Will stop this  CCB to allow his heart rate to recover. I've asked him to undergo a 48-hour Holter monitor to see if there is a bradycardia dysrhythmia or pauses. -     Thyroid Panel With TSH; Future -     Holter monitor - 48 hour; Future  Generalized OA -     Discontinue: oxyCODONE-acetaminophen (PERCOCET) 10-325 MG tablet; Take 1 tablet by mouth every 8 (eight) hours as needed for pain. -     oxyCODONE-acetaminophen (PERCOCET) 10-325 MG tablet; Take 1 tablet by mouth every 8 (eight) hours as needed for pain.   I have discontinued Mr. Reber's amLODipine. I am also having him start on irbesartan. Additionally, I am having him maintain his ferrous sulfate, cyanocobalamin, pantoprazole, chlorthalidone, atorvastatin, furosemide, gabapentin, and oxyCODONE-acetaminophen.  Meds ordered this encounter  Medications  . irbesartan (AVAPRO) 300 MG tablet    Sig: Take 1 tablet (300 mg total) by mouth daily.    Dispense:  90 tablet    Refill:  1  . DISCONTD:  oxyCODONE-acetaminophen (PERCOCET) 10-325 MG tablet    Sig: Take 1 tablet by mouth every 8 (eight) hours as needed for pain.    Dispense:  90 tablet    Refill:  0    Fill on of after 11/26/16  . oxyCODONE-acetaminophen (PERCOCET) 10-325 MG tablet    Sig: Take 1 tablet by mouth every 8 (eight) hours as needed for pain.    Dispense:  90 tablet    Refill:  0    Fill on of after 12/27/16     Follow-up: Return in about 2 months (around 01/27/2017).  Scarlette Calico, MD

## 2016-11-26 NOTE — Patient Instructions (Signed)

## 2016-11-27 LAB — THYROID PANEL WITH TSH
Free Thyroxine Index: 2.1 (ref 1.4–3.8)
T3 Uptake: 27 % (ref 22–35)
T4, Total: 7.6 ug/dL (ref 4.5–12.0)
TSH: 1.42 mIU/L (ref 0.40–4.50)

## 2016-12-06 ENCOUNTER — Encounter: Payer: Self-pay | Admitting: Family Medicine

## 2016-12-06 ENCOUNTER — Telehealth: Payer: Self-pay | Admitting: Family Medicine

## 2016-12-06 ENCOUNTER — Ambulatory Visit
Admission: RE | Admit: 2016-12-06 | Discharge: 2016-12-06 | Disposition: A | Discharge status: Home / Self Care | Payer: Medicare Other | Source: Ambulatory Visit | Attending: Internal Medicine | Admitting: Internal Medicine

## 2016-12-06 ENCOUNTER — Ambulatory Visit (INDEPENDENT_AMBULATORY_CARE_PROVIDER_SITE_OTHER): Payer: Medicare Other | Admitting: Family Medicine

## 2016-12-06 VITALS — BP 142/70 | HR 92 | Temp 98.3°F | Ht 69.0 in | Wt 205.0 lb

## 2016-12-06 DIAGNOSIS — M5416 Radiculopathy, lumbar region: Secondary | ICD-10-CM | POA: Diagnosis not present

## 2016-12-06 DIAGNOSIS — M12811 Other specific arthropathies, not elsewhere classified, right shoulder: Secondary | ICD-10-CM

## 2016-12-06 DIAGNOSIS — M5126 Other intervertebral disc displacement, lumbar region: Secondary | ICD-10-CM | POA: Diagnosis not present

## 2016-12-06 DIAGNOSIS — M75101 Unspecified rotator cuff tear or rupture of right shoulder, not specified as traumatic: Secondary | ICD-10-CM

## 2016-12-06 MED ORDER — METHYLPREDNISOLONE ACETATE 40 MG/ML IJ SUSP
40.0000 mg | Freq: Once | INTRAMUSCULAR | Status: AC
  Administered 2016-12-06: 40 mg via INTRA_ARTICULAR

## 2016-12-06 NOTE — Progress Notes (Signed)
Adrian Miller - 76 y.o. male MRN [id number]  Date of birth: 09-20-1940  SUBJECTIVE:  Including CC & ROS.  Chief Complaint  Patient presents with  . Shoulder Pain    right shoulder pain   [name] a 76 year old male that is presenting with right shoulder pain. This pain is acute on chronic in nature. He reports he was lifting something up and exacerbated his pain. He has a history of similar pain. He takes different medications to help. Has received intra-articular injections that helped with his pain. He has a history of a left shoulder replacement. He has a history of bilateral knee replacements as well. He denies any radicular symptoms.   He had an MRI of his lumbar spine recently. He has some questions about that today.  Review of Systems  Constitutional: Negative for fever.  Cardiovascular: Negative for leg swelling.  Musculoskeletal: Positive for arthralgias, back pain, gait problem, joint swelling and myalgias.  Neurological: Positive for numbness. Negative for weakness.  otherwise negative   HISTORY: Past Medical, Surgical, Social, and Family History Reviewed & Updated per EMR.   Pertinent Historical Findings include:  Past Medical History:  Diagnosis Date  . Acute esophagitis 06/01/2015  . Benign essential HTN 05/31/2015  . Duodenal ulcer hemorrhage   . Esophageal stricture 06/01/2015  . Hiatal hernia 06/01/2015    Past Surgical History:  Procedure Laterality Date  . ESOPHAGOGASTRODUODENOSCOPY N/A 05/31/2015   Procedure: ESOPHAGOGASTRODUODENOSCOPY (EGD);  Surgeon: Ladene Artist, MD;  Location: [facility];  Service: Endoscopy;  Laterality: N/A;  . HERNIA REPAIR  1975  . LEFT ROTATOR CUFF REPAIR  2002  . REPLACEMENT TOTAL KNEE Left   . TOTAL KNEE ARTHROPLASTY Right 02/22/2015   Procedure: TOTAL KNEE ARTHROPLASTY;  Surgeon: Melrose Nakayama, MD;  Location: [place];  Service: Orthopedics;  Laterality: Right;    Allergies  Allergen Reactions  . Asa [Aspirin]    angioedema    Family History  Problem Relation Age of Onset  . Hypertension Mother   . Heart attack Sister      Social History   Social History  . Marital status: Single    Spouse name: N/A  . Number of children: N/A  . Years of education: N/A   Occupational History  . Not on file.   Social History Main Topics  . Smoking status: Current Every Day Smoker    Packs/day: 0.50    Years: 51.00    Types: Cigarettes  . Smokeless tobacco: Former Systems developer    Quit date: 11/03/2014     Comment: quit 2 weeks ago  . Alcohol use No  . Drug use: No  . Sexual activity: No   Other Topics Concern  . Not on file   Social History Narrative  . No narrative on file     PHYSICAL EXAM:  VS: BP (!) 142/70 (BP Location: Left Arm, Patient Position: Sitting, Cuff Size: Normal)   Pulse 92   Temp 98.3 F (36.8 C) (Oral)   Ht 5\' 9"  (1.753 m)   Wt 205 lb (93 kg)   SpO2 99%   BMI 30.27 kg/m  Physical Exam Gen: NAD, alert, cooperative with exam, well-appearing ENT: normal lips, normal nasal mucosa,  Eye: PERRL, normal conjunctiva and lids CV:  no edema, +2 pedal pulses   Resp: no accessory muscle use, non-labored,  Skin: no rashes, no areas of induration  Neuro: normal tone,  normal sensation to touch Psych:  normal insight, alert and oriented MSK:  Right  shoulder: Significant limitations with active flexion and abduction to about 90. Limitations with external rotation to about 5. Worse pain with external rotation and abduction. Weakness that is equivocal with internal and external rotation to resistance. No significant weakness with empty can testing. No significant pain with Hawkin's testing. Some pain with drop arm test. Neurovascularly intact.   Aspiration/Injection Procedure Note WOODROE VOGAN 1940/06/17  Procedure: Injection Indications: Right Shoulder pain   Procedure Details Consent: Risks of procedure as well as the alternatives and risks of each were explained to the  (patient/caregiver).  Consent for procedure obtained. Time Out: Verified patient identification, verified procedure, site/side was marked, verified correct patient position, special equipment/implants available, medications/allergies/relevent history reviewed, required imaging and test results available.  Performed.  The area was cleaned with iodine and alcohol swabs.    The right glenohumeral joint was injected using 1 cc's of 40 mg Depomedrol and 4 cc's of 1% lidocaine with a 25 1 1/2" needle.  Ultrasound was used. Images were obtained in Transverse view showing the injection.    A sterile dressing was applied.  Patient did tolerate procedure well.       ASSESSMENT & PLAN:   Rotator cuff tear arthropathy of right shoulder Acute on chronic exacerbation of his glenohumeral joint pain. His last injection was in February of this year. He has had a left shoulder replacement. Does not want to have a right shoulder performed at this time. - Glenohumeral injection today. - Advised to follow-up as needed.  Left lumbar radiculopathy His lumbar spine MRI has had significant worsening since his previous MRI in 2010. Reviewed these images. He is having spinal stenosis as well as loss of disc height and degenerative changes throughout his lumbar spine. - Could consider an epidural injection or having him see a surgeon to get their opinion.

## 2016-12-06 NOTE — Patient Instructions (Signed)
Thank you for coming in,   Please that is now a few your shoulder pain is not getting any better. We can try an injection in a different spot if it does not.   Please feel free to call with any questions or concerns at any time, at [phone]. --Dr. Raeford Razor

## 2016-12-06 NOTE — Assessment & Plan Note (Signed)
His lumbar spine MRI has had significant worsening since his previous MRI in 2010. Reviewed these images. He is having spinal stenosis as well as loss of disc height and degenerative changes throughout his lumbar spine. - Could consider an epidural injection or having him see a surgeon to get their opinion.

## 2016-12-06 NOTE — Telephone Encounter (Signed)
Thank you. Dr. Raeford Razor with be fine.

## 2016-12-06 NOTE — Telephone Encounter (Signed)
FYI:  Patient requesting to be worked in today for injection in shoulder.  I offered patient appt on Monday.  Patient states he can not stand the pain in his shoulder that long. I have scheduled pt with Raeford Razor.

## 2016-12-06 NOTE — Assessment & Plan Note (Signed)
Acute on chronic exacerbation of his glenohumeral joint pain. His last injection was in February of this year. He has had a left shoulder replacement. Does not want to have a right shoulder performed at this time. - Glenohumeral injection today. - Advised to follow-up as needed.

## 2016-12-18 ENCOUNTER — Encounter: Payer: Self-pay | Admitting: *Deleted

## 2016-12-18 NOTE — Progress Notes (Signed)
Patient ID: Adrian Miller, male   DOB: [birth year], 76 y.o.   MRN: [id number]  Patient did not show up for his 12/18/16 appointment to have a 48 hour holter monitor applied.

## 2017-01-06 ENCOUNTER — Other Ambulatory Visit: Payer: Self-pay | Admitting: Internal Medicine

## 2017-01-10 ENCOUNTER — Telehealth: Payer: Self-pay | Admitting: Internal Medicine

## 2017-01-10 DIAGNOSIS — I10 Essential (primary) hypertension: Secondary | ICD-10-CM

## 2017-01-10 MED ORDER — CHLORTHALIDONE 25 MG PO TABS: 25.0000 mg | ORAL_TABLET | Freq: Every day | ORAL | 1 refills | Status: DC

## 2017-01-10 NOTE — Telephone Encounter (Signed)
Pt called stating that he is needing a refill on his blood pressure medication sent to [facility] on [address]. I see where we received a refill request for Amlodipine that was denied but he did not know the name of what he was taking.

## 2017-01-10 NOTE — Telephone Encounter (Signed)
Pt is needing his chlorthalidone 25 mg. Sent [facility] to [facility].../lmb

## 2017-01-18 ENCOUNTER — Telehealth: Payer: Self-pay | Admitting: Internal Medicine

## 2017-01-18 NOTE — Telephone Encounter (Signed)
Patient no showed for his appointment on 12/18/2016 to get the heart monitor, I called today to reschedule and patient advised he did not want the monitor

## 2017-01-24 ENCOUNTER — Telehealth: Payer: Self-pay | Admitting: Internal Medicine

## 2017-01-24 ENCOUNTER — Other Ambulatory Visit: Payer: Self-pay | Admitting: Internal Medicine

## 2017-01-24 DIAGNOSIS — M159 Polyosteoarthritis, unspecified: Secondary | ICD-10-CM

## 2017-01-24 DIAGNOSIS — M5416 Radiculopathy, lumbar region: Secondary | ICD-10-CM

## 2017-01-24 MED ORDER — OXYCODONE-ACETAMINOPHEN 10-325 MG PO TABS: 1.0000 | ORAL_TABLET | Freq: Three times a day (TID) | ORAL | 0 refills | Status: DC | PRN

## 2017-01-24 NOTE — Telephone Encounter (Signed)
RX written 

## 2017-01-24 NOTE — Telephone Encounter (Signed)
Pt would like a refill of his oxyCODONE-acetaminophen (PERCOCET) 10-325 MG tablet  Last seen 11/2016 Please advise

## 2017-02-21 ENCOUNTER — Encounter: Payer: Self-pay | Admitting: Family Medicine

## 2017-02-21 ENCOUNTER — Telehealth: Payer: Self-pay

## 2017-02-21 ENCOUNTER — Ambulatory Visit (INDEPENDENT_AMBULATORY_CARE_PROVIDER_SITE_OTHER): Payer: Medicare Other | Admitting: Family Medicine

## 2017-02-21 VITALS — BP 132/68 | HR 64 | Temp 97.9°F | Ht 69.0 in | Wt 204.0 lb

## 2017-02-21 DIAGNOSIS — I10 Essential (primary) hypertension: Secondary | ICD-10-CM

## 2017-02-21 DIAGNOSIS — M5416 Radiculopathy, lumbar region: Secondary | ICD-10-CM

## 2017-02-21 DIAGNOSIS — H1031 Unspecified acute conjunctivitis, right eye: Secondary | ICD-10-CM

## 2017-02-21 DIAGNOSIS — M159 Polyosteoarthritis, unspecified: Secondary | ICD-10-CM

## 2017-02-21 MED ORDER — OXYCODONE-ACETAMINOPHEN 10-325 MG PO TABS: 1.0000 | ORAL_TABLET | Freq: Three times a day (TID) | ORAL | 0 refills | Status: DC | PRN

## 2017-02-21 MED ORDER — IRBESARTAN 300 MG PO TABS: 300.0000 mg | ORAL_TABLET | Freq: Every day | ORAL | 1 refills | Status: DC

## 2017-02-21 MED ORDER — ERYTHROMYCIN 5 MG/GM OP OINT: 1.0000 "application " | TOPICAL_OINTMENT | Freq: Three times a day (TID) | OPHTHALMIC | 0 refills | Status: AC

## 2017-02-21 NOTE — Patient Instructions (Addendum)
Thank you for coming in,   Please try the antibiotic eye medicine and if you don't have any improvement then please call me back. I may need to send you to the eye doctor.   You received an injection into your shoulder in August 2. The next time you can get an injection is 11/2.    Please feel free to call with any questions or concerns at any time, at [phone]. --Dr. Raeford Razor

## 2017-02-21 NOTE — Progress Notes (Signed)
Adrian Miller - 76 y.o. male MRN [id number]  Date of birth: December 22, 1940  SUBJECTIVE:  Including CC & ROS.  Chief Complaint  Patient presents with  . Eye problems    right eye-bloodshot-has been present for one month. Has been using visine, has not helped. He states his ear is hurting also.    Adrian Miller is a 76 yo M that is presenting with a red eye and HTN. He reports that his eye has been red and swollen for about a month now. Denies any sandy feelings or rocks in the eye. 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 His eye has been bad a chair in the morning. Denies any changes in his vision. No photophobia.  He also needs a refill on his hypertension medications. Reports compliance. No side effects with the medication. No chest pain or leg swelling.     Review of Systems  Constitutional: Negative for fever.  Eyes: Positive for redness. Negative for photophobia and pain.  Cardiovascular: Negative for chest pain and leg swelling.    HISTORY: Past Medical, Surgical, Social, and Family History Reviewed & Updated per EMR.   Pertinent Historical Findings include:  Past Medical History:  Diagnosis Date  . Acute esophagitis 06/01/2015  . Benign essential HTN 05/31/2015  . Duodenal ulcer hemorrhage   . Esophageal stricture 06/01/2015  . Hiatal hernia 06/01/2015    Past Surgical History:  Procedure Laterality Date  . ESOPHAGOGASTRODUODENOSCOPY N/A 05/31/2015   Procedure: ESOPHAGOGASTRODUODENOSCOPY (EGD);  Surgeon: Ladene Artist, MD;  Location: [facility];  Service: Endoscopy;  Laterality: N/A;  . HERNIA REPAIR  1975  . LEFT ROTATOR CUFF REPAIR  2002  . REPLACEMENT TOTAL KNEE Left   . TOTAL KNEE ARTHROPLASTY Right 02/22/2015   Procedure: TOTAL KNEE ARTHROPLASTY;  Surgeon: Melrose Nakayama, MD;  Location: [place];  Service: Orthopedics;  Laterality: Right;    Allergies  Allergen Reactions  . Asa [Aspirin]     angioedema    Family History  Problem Relation Age of Onset   . Hypertension Mother   . Heart attack Sister      Social History   Social History  . Marital status: Single    Spouse name: N/A  . Number of children: N/A  . Years of education: N/A   Occupational History  . Not on file.   Social History Main Topics  . Smoking status: Current Every Day Smoker    Packs/day: 0.50    Years: 51.00    Types: Cigarettes  . Smokeless tobacco: Former Systems developer    Quit date: 11/03/2014     Comment: quit 2 weeks ago  . Alcohol use No  . Drug use: No  . Sexual activity: No   Other Topics Concern  . Not on file   Social History Narrative  . No narrative on file     PHYSICAL EXAM:  VS: BP 132/68 (BP Location: Left Arm, Patient Position: Sitting, Cuff Size: Normal)   Pulse 64   Temp 97.9 F (36.6 C) (Oral)   Ht 5\' 9"  (1.753 m)   Wt 204 lb (92.5 kg)   SpO2 100%   BMI 30.13 kg/m  Physical Exam Gen: NAD, alert, cooperative with exam,  ENT: normal lips, normal nasal mucosa,  Eye: normal EOM, normal lids, inject his right eye, pupils equal and reactive, no debris observed within the eye, CV:  no edema, +2 pedal pulses   Resp: no accessory muscle use, non-labored,  Skin: no rashes,  no areas of induration  Neuro: normal tone, normal sensation to touch Psych:  normal insight, alert and oriented MSK: Normal strength, ambulating with cane    ASSESSMENT & PLAN:   Benign essential HTN Currently controlled - Refilled his medication today  Acute conjunctivitis of right eye Most likely viral in nature. Has not had a improvement however. 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 - Erythromycin ointment - If no improvement advised to be seen by an ophthalmologist.

## 2017-02-21 NOTE — Assessment & Plan Note (Signed)
Most likely viral in nature. Has not had a improvement however. 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 - Erythromycin ointment - If no improvement advised to be seen by an ophthalmologist.

## 2017-02-21 NOTE — Telephone Encounter (Signed)
Pt came in for an appt with Dr. Raeford Razor. Pt requested placard form and rf of oxy.   Both completed. Pt has been scheduled with PCP for any additional refills per policy.

## 2017-02-21 NOTE — Assessment & Plan Note (Signed)
Currently controlled - Refilled his medication today

## 2017-03-04 ENCOUNTER — Ambulatory Visit: Payer: Medicare Other | Admitting: Internal Medicine

## 2017-03-12 ENCOUNTER — Emergency Department (HOSPITAL_COMMUNITY): Payer: Medicare Other

## 2017-03-12 ENCOUNTER — Emergency Department (HOSPITAL_COMMUNITY)
Admission: EM | Admit: 2017-03-12 | Discharge: 2017-03-12 | Disposition: A | Discharge status: Home / Self Care | Payer: Medicare Other | Attending: Emergency Medicine | Admitting: Emergency Medicine

## 2017-03-12 ENCOUNTER — Encounter (HOSPITAL_COMMUNITY): Payer: Self-pay

## 2017-03-12 DIAGNOSIS — Z79899 Other long term (current) drug therapy: Secondary | ICD-10-CM | POA: Insufficient documentation

## 2017-03-12 DIAGNOSIS — I10 Essential (primary) hypertension: Secondary | ICD-10-CM | POA: Insufficient documentation

## 2017-03-12 DIAGNOSIS — F17210 Nicotine dependence, cigarettes, uncomplicated: Secondary | ICD-10-CM | POA: Diagnosis not present

## 2017-03-12 DIAGNOSIS — R079 Chest pain, unspecified: Secondary | ICD-10-CM | POA: Diagnosis not present

## 2017-03-12 DIAGNOSIS — Z720 Tobacco use: Secondary | ICD-10-CM

## 2017-03-12 DIAGNOSIS — Z96652 Presence of left artificial knee joint: Secondary | ICD-10-CM | POA: Insufficient documentation

## 2017-03-12 LAB — BASIC METABOLIC PANEL
Anion gap: 12 (ref 5–15)
BUN: 26 mg/dL — ABNORMAL HIGH (ref 6–20)
CO2: 24 mmol/L (ref 22–32)
Calcium: 8.9 mg/dL (ref 8.9–10.3)
Chloride: 101 mmol/L (ref 101–111)
Creatinine, Ser: 1.4 mg/dL — ABNORMAL HIGH (ref 0.61–1.24)
GFR calc Af Amer: 55 mL/min — ABNORMAL LOW (ref >60)
GFR calc non Af Amer: 48 mL/min — ABNORMAL LOW (ref >60)
Glucose, Bld: 125 mg/dL — ABNORMAL HIGH (ref 65–99)
Potassium: 4 mmol/L (ref 3.5–5.1)
Sodium: 137 mmol/L (ref 135–145)

## 2017-03-12 LAB — I-STAT TROPONIN, ED: Troponin i, poc: 0.01 ng/mL (ref 0.00–0.08)

## 2017-03-12 LAB — CBC
HCT: 41.1 % (ref 39.0–52.0)
Hemoglobin: 14.3 g/dL (ref 13.0–17.0)
MCH: 30.4 pg (ref 26.0–34.0)
MCHC: 34.8 g/dL (ref 30.0–36.0)
MCV: 87.4 fL (ref 78.0–100.0)
Platelets: 297 10*3/uL (ref 150–400)
RBC: 4.7 MIL/uL (ref 4.22–5.81)
RDW: 14.5 % (ref 11.5–15.5)
WBC: 14 10*3/uL — ABNORMAL HIGH (ref 4.0–10.5)

## 2017-03-12 NOTE — ED Triage Notes (Signed)
Pt found at hospital doors about to fall down. He was wheeled around to the ED. He is complaining of generalized chest pain that started 1 hour ago. Pt reports that he did drive here. Also endorses SOB. Denies N/V. EKG shows NSR.

## 2017-03-12 NOTE — Discharge Instructions (Signed)
It is important to stop smoking.  Use Tylenol for pain.

## 2017-03-12 NOTE — ED Notes (Signed)
ED Provider at bedside. 

## 2017-03-12 NOTE — ED Provider Notes (Signed)
[facility] DEPT Provider Note   CSN: [id number] Arrival date & time: 03/12/17  0020     History   Chief Complaint Chief Complaint  Patient presents with  . Chest Pain    HPI Adrian Miller is a 76 y.o. male.  He presents for chest pain as well as generalized achiness, present for several days, typical of his usual pain.  He has a cough occasionally which is nonproductive.  He continues to smoke cigarettes.[name]  He denies nausea, vomiting, fever, chills, weakness or dizziness.  He is taking his usual medications.  There are no other known modifying factors.  HPI  Past Medical History:  Diagnosis Date  . Acute esophagitis 06/01/2015  . Benign essential HTN 05/31/2015  . Duodenal ulcer hemorrhage   . Esophageal stricture 06/01/2015  . Hiatal hernia 06/01/2015    Patient Active Problem List   Diagnosis Date Noted  . Acute conjunctivitis of right eye 02/21/2017  . Bradycardia 11/26/2016  . Left lumbar radiculopathy 01/18/2016  . Calculus of gallbladder without cholecystitis without obstruction 01/16/2016  . Left carpal tunnel syndrome 12/28/2015  . Hyperlipidemia with target LDL less than 130 12/26/2015  . GERD (gastroesophageal reflux disease) 12/19/2015  . Tobacco abuse counseling 12/19/2015  . Esophageal reflux   . Arthritis of right hip 06/15/2015  . Rotator cuff tear arthropathy of right shoulder 06/15/2015  . Hyperglycemia 06/14/2015  . Generalized OA 06/14/2015  . B12 deficiency anemia 06/14/2015  . Other iron deficiency anemias 06/14/2015  . Hiatal hernia 06/01/2015  . Esophageal stricture 06/01/2015  . Benign essential HTN 05/31/2015  . Duodenal ulcer hemorrhage     Past Surgical History:  Procedure Laterality Date  . HERNIA REPAIR  1975  . LEFT ROTATOR CUFF REPAIR  2002  . REPLACEMENT TOTAL KNEE Left        Home Medications    Prior to Admission medications   Medication Sig Start Date End Date Taking? Authorizing Provider   atorvastatin (LIPITOR) 20 MG tablet Take 1 tablet (20 mg total) by mouth daily. 12/27/15   Janith Lima, MD  chlorthalidone (HYGROTON) 25 MG tablet Take 1 tablet (25 mg total) by mouth daily. 01/10/17   Janith Lima, MD  cyanocobalamin 2000 MCG tablet Take 1 tablet (2,000 mcg total) by mouth daily. 06/14/15   Janith Lima, MD  ferrous sulfate 325 (65 FE) MG tablet Take 1 tablet (325 mg total) by mouth 3 (three) times daily with meals. Patient taking differently: Take 325 mg by mouth 2 (two) times daily with a meal.  06/14/15   Janith Lima, MD  furosemide (LASIX) 20 MG tablet Take 1 tablet (20 mg total) by mouth daily as needed for edema. 12/28/15   Lyndal Pulley, DO  gabapentin (NEURONTIN) 100 MG capsule Take 2 capsules (200 mg total) by mouth at bedtime. 01/18/16   Lyndal Pulley, DO  irbesartan (AVAPRO) 300 MG tablet Take 1 tablet (300 mg total) by mouth daily. 02/21/17   Rosemarie Ax, MD  oxyCODONE-acetaminophen (PERCOCET) 10-325 MG tablet Take 1 tablet by mouth every 8 (eight) hours as needed for pain. 02/21/17   Janith Lima, MD  pantoprazole (PROTONIX) 40 MG tablet Take 1 tablet (40 mg total) by mouth daily. 12/19/15   Dhungel, Flonnie Overman, MD    Family History Family History  Problem Relation Age of Onset  . Hypertension Mother   . Heart attack Sister     Social History Social History  Tobacco Use  . Smoking status: Current Every Day Smoker    Packs/day: 0.50    Years: 51.00    Pack years: 25.50    Types: Cigarettes  . Smokeless tobacco: Former Systems developer    Quit date: 11/03/2014  . Tobacco comment: quit 2 weeks ago  Substance Use Topics  . Alcohol use: No    Alcohol/week: 0.0 oz  . Drug use: No     Allergies   Asa [aspirin]   Review of Systems Review of Systems  All other systems reviewed and are negative.    Physical Exam Updated Vital Signs BP (!) 147/79 (BP Location: Right Arm)   Pulse 81   Temp 97.9 F (36.6 C) (Oral)   Resp 18   SpO2 99%    Physical Exam  Constitutional: He is oriented to person, place, and time. He appears well-developed and well-nourished. He does not appear ill.  HENT:  Head: Normocephalic and atraumatic.  Right Ear: External ear normal.  Left Ear: External ear normal.  Eyes: Conjunctivae and EOM are normal. Pupils are equal, round, and reactive to light.  Neck: Normal range of motion and phonation normal. Neck supple.  Cardiovascular: Normal rate, regular rhythm, normal heart sounds and normal pulses.  Pulmonary/Chest: Effort normal and breath sounds normal. He exhibits no bony tenderness.  Abdominal: Soft. There is no tenderness.  Musculoskeletal: Normal range of motion.  Normal Gait  Neurological: He is alert and oriented to person, place, and time. No cranial nerve deficit or sensory deficit. He exhibits normal muscle tone. Coordination normal.  No dysarthria or aphasia.  Skin: Skin is warm, dry and intact.  Psychiatric: He has a normal mood and affect. His behavior is normal. Judgment and thought content normal.  Nursing note and vitals reviewed.    ED Treatments / Results  Labs (all labs ordered are listed, but only abnormal results are displayed) Labs Reviewed  BASIC METABOLIC PANEL - Abnormal; Notable for the following components:      Result Value   Glucose, Bld 125 (*)    BUN 26 (*)    Creatinine, Ser 1.40 (*)    GFR calc non Af Amer 48 (*)    GFR calc Af Amer 55 (*)    All other components within normal limits  CBC - Abnormal; Notable for the following components:   WBC 14.0 (*)    All other components within normal limits  I-STAT TROPONIN, ED   BUN  Date Value Ref Range Status  03/12/2017 26 (H) 6 - 20 mg/dL Final  11/26/2016 10 6 - 23 mg/dL Final  12/26/2015 13 6 - 23 mg/dL Final  12/19/2015 13 6 - 20 mg/dL Final   Creatinine, Ser  Date Value Ref Range Status  03/12/2017 1.40 (H) 0.61 - 1.24 mg/dL Final  11/26/2016 0.96 0.40 - 1.50 mg/dL Final  12/26/2015 0.99 0.40 -  1.50 mg/dL Final  12/19/2015 0.97 0.61 - 1.24 mg/dL Final      EKG  EKG Interpretation  Date/Time:  Tuesday March 12 2017 00:24:53 EST Ventricular Rate:  88 PR Interval:    QRS Duration: 87 QT Interval:  353 QTC Calculation: 428 R Axis:   59 Text Interpretation:  Sinus rhythm Normal ECG When compared with ECG of 12/18/2015, Nonspecific T wave abnormality has improved Premature ventricular complexes are no longer present Confirmed by Delora Fuel ([id number]) on 03/12/2017 12:40:54 AM       Radiology Dg Chest 2 View  Result Date: 03/12/2017 CLINICAL DATA:  Upper mid chest pain for a few hours. Shortness of breath and weakness. Smoker. EXAM: CHEST  2 VIEW COMPARISON:  12/18/2015 FINDINGS: Slightly shallow inspiration. Heart size and pulmonary vascularity are normal for technique. No airspace disease or consolidation in the lungs. No blunting of costophrenic angles. No pneumothorax. Degenerative changes in the spine and shoulders. IMPRESSION: No evidence of active pulmonary disease. Electronically Signed   By: Lucienne Capers M.D.   On: 03/12/2017 01:00    Procedures Procedures (including critical care time)  Medications Ordered in ED Medications - No data to display   Initial Impression / Assessment and Plan / ED Course  I have reviewed the triage vital signs and the nursing notes.  Pertinent labs & imaging results that were available during my care of the patient were reviewed by me and considered in my medical decision making (see chart for details).  Clinical Course as of Mar 12 726  Tue Mar 12, 2017  0725 Mild elevation WBC: (!) 14.0 [EW]  0725 Normal Troponin i, poc: 0.01 [EW]  0725 Normal Sodium: 137 [EW]  0725 Mild elevation Creatinine: (!) 1.40 [EW]  0725 Elevated, nonfasting Glucose: (!) 125 [EW]  0725 No pneumonia or CHF DG Chest 2 View [EW]    Clinical Course User Index [EW] Daleen Bo, MD     Patient Vitals for the past 24 hrs:  BP Temp Temp src Pulse  Resp SpO2  03/12/17 0035 (!) 147/79 97.9 F (36.6 C) Oral 81 18 99 %    7:24 AM Reevaluation with update and discussion. After initial assessment and treatment, an updated evaluation reveals no change in clinical status.  Findings discussed with the patient and all questions were answered. Laren Whaling L     Final Clinical Impressions(s) / ED Diagnoses   Final diagnoses:  Nonspecific chest pain  Tobacco abuse    Nonspecific myalgia, and chest pain.  Patient ongoing smoker.  Mild incidental renal insufficiency.  Doubt ACS, PE or pneumonia.  Nursing Notes Reviewed/ Care Coordinated Applicable Imaging Reviewed Interpretation of Laboratory Data incorporated into ED treatment  The patient appears reasonably screened and/or stabilized for discharge and I doubt any other medical condition or other [facility] requiring further screening, evaluation, or treatment in the ED at this time prior to discharge.  Plan: Home Medications-continue usual medications; Home Treatments-increase oral fluid intake; return here if the recommended treatment, does not improve the symptoms; Recommended follow up-PCP, as needed   ED Discharge Orders    None       Daleen Bo, MD 03/12/17 [phone]

## 2017-03-12 NOTE — ED Notes (Signed)
Pt ambulated to the restroom and reports he's feeling a little better.   Pt placed in lobby to wait for room.

## 2017-03-25 ENCOUNTER — Telehealth: Payer: Self-pay | Admitting: Internal Medicine

## 2017-03-25 DIAGNOSIS — M159 Polyosteoarthritis, unspecified: Secondary | ICD-10-CM

## 2017-03-25 DIAGNOSIS — M5416 Radiculopathy, lumbar region: Secondary | ICD-10-CM

## 2017-03-25 MED ORDER — OXYCODONE-ACETAMINOPHEN 10-325 MG PO TABS: 1.0000 | ORAL_TABLET | Freq: Three times a day (TID) | ORAL | 0 refills | Status: DC | PRN

## 2017-03-25 NOTE — Telephone Encounter (Signed)
[place] controlled substance database checked.  Ok to fill medication.  rx sent to pharmacy

## 2017-03-25 NOTE — Telephone Encounter (Signed)
Pt called requesting a refill on oxyCODONE-acetaminophen (PERCOCET) 10-325 MG tablet  

## 2017-03-25 NOTE — Telephone Encounter (Signed)
Can you advise in PCP absence.  

## 2017-03-26 DIAGNOSIS — M25511 Pain in right shoulder: Secondary | ICD-10-CM | POA: Diagnosis not present

## 2017-03-26 DIAGNOSIS — M47816 Spondylosis without myelopathy or radiculopathy, lumbar region: Secondary | ICD-10-CM | POA: Diagnosis not present

## 2017-03-26 NOTE — Telephone Encounter (Signed)
Per chart refill [facility] by Dr. Quay Burow...Johny Chess

## 2017-03-26 NOTE — Telephone Encounter (Signed)
Tried to call and inform patient, both phones have no voicemail set up

## 2017-03-26 NOTE — Telephone Encounter (Signed)
Pt called regarding this, can you please send to pharmacy in Stef absence.

## 2017-04-23 ENCOUNTER — Other Ambulatory Visit: Payer: Self-pay | Admitting: Internal Medicine

## 2017-04-23 ENCOUNTER — Telehealth: Payer: Self-pay | Admitting: Internal Medicine

## 2017-04-23 DIAGNOSIS — M5416 Radiculopathy, lumbar region: Secondary | ICD-10-CM

## 2017-04-23 DIAGNOSIS — M159 Polyosteoarthritis, unspecified: Secondary | ICD-10-CM

## 2017-04-23 MED ORDER — OXYCODONE-ACETAMINOPHEN 10-325 MG PO TABS: 1.0000 | ORAL_TABLET | Freq: Three times a day (TID) | ORAL | 0 refills | Status: DC | PRN

## 2017-04-23 NOTE — Telephone Encounter (Signed)
Copied from [place] [id number]. Topic: Quick Communication - Rx Refill/Question >> Apr 23, 2017  1:26 PM Patrice Paradise wrote: Has the patient contacted their pharmacy? Yes.   oxyCODONE-acetaminophen (PERCOCET) 10-325 MG tablet (PATIENT HAS ONLY 3 PILLS LEFT. PATIENT WAS INFORMED THAT REFILLS MAY TAKE UP TO 3 BUSINESS DAYS).  (Agent: If no, request that the patient contact the pharmacy for the refill.)  Preferred Pharmacy (with phone number or street name):  [id number] [place], [place] - [address] [place] [address] Phone: [phone] Fax: [phone]   Agent: Please be advised that RX refills may take up to 3 business days. We ask that you follow-up with your pharmacy.

## 2017-04-25 DIAGNOSIS — H6690 Otitis media, unspecified, unspecified ear: Secondary | ICD-10-CM | POA: Diagnosis not present

## 2017-04-25 DIAGNOSIS — H9190 Unspecified hearing loss, unspecified ear: Secondary | ICD-10-CM | POA: Diagnosis not present

## 2017-04-25 DIAGNOSIS — R351 Nocturia: Secondary | ICD-10-CM | POA: Diagnosis not present

## 2017-05-28 ENCOUNTER — Telehealth: Payer: Self-pay | Admitting: Internal Medicine

## 2017-05-28 ENCOUNTER — Other Ambulatory Visit: Payer: Self-pay | Admitting: Internal Medicine

## 2017-05-28 DIAGNOSIS — M159 Polyosteoarthritis, unspecified: Secondary | ICD-10-CM

## 2017-05-28 DIAGNOSIS — M5416 Radiculopathy, lumbar region: Secondary | ICD-10-CM

## 2017-05-28 MED ORDER — OXYCODONE-ACETAMINOPHEN 10-325 MG PO TABS: 1.0000 | ORAL_TABLET | Freq: Three times a day (TID) | ORAL | 0 refills | Status: DC | PRN

## 2017-05-28 NOTE — Telephone Encounter (Signed)
CRM for notification. See Telephone encounter for:   05/28/17.   Relation to pt: self Call back [place]: [id number] [place] [address] [place] [address] Phone: [phone] Fax: [phone]  Reason for call:  Patient requesting  oxyCODONE-acetaminophen (PERCOCET) 10-325 MG tablet, informed patient please allow 48 to 72 hour turn around time, please advise

## 2017-06-24 NOTE — Progress Notes (Signed)
[facility] [name], [name] [address] Phone: [phone] Subjective:    I'm seeing this patient by the request  of:    CC: Intubated shoulder pain  QMG:[id number]  Adrian Miller is a 77 y.o. male coming in with complaint of hand and shoulder pain.  Has been found to have rotator cuff arthropathy of the right shoulder as well as carpal tunnel of the left wrist.  Patient has been given injections previously last one greater than 8 months ago.  Patient states having worsening pain in the right shoulder again.  Having been affect daily activities.  Unable to dress himself well with the pain.  Also having worsening pain with the left wrist.  Increasing numbness.  Noticing he is dropping things.  More pain at night.  States that it seems to be more constant as well.     Past Medical History:  Diagnosis Date  . Acute esophagitis 06/01/2015  . Benign essential HTN 05/31/2015  . Duodenal ulcer hemorrhage   . Esophageal stricture 06/01/2015  . Hiatal hernia 06/01/2015   Past Surgical History:  Procedure Laterality Date  . ESOPHAGOGASTRODUODENOSCOPY N/A 05/31/2015   Procedure: ESOPHAGOGASTRODUODENOSCOPY (EGD);  Surgeon: Ladene Artist, MD;  Location: [facility];  Service: Endoscopy;  Laterality: N/A;  . HERNIA REPAIR  1975  . LEFT ROTATOR CUFF REPAIR  2002  . REPLACEMENT TOTAL KNEE Left   . TOTAL KNEE ARTHROPLASTY Right 02/22/2015   Procedure: TOTAL KNEE ARTHROPLASTY;  Surgeon: Melrose Nakayama, MD;  Location: [facility];  Service: Orthopedics;  Laterality: Right;   Social History   Socioeconomic History  . Marital status: Single    Spouse name: Not on file  . Number of children: Not on file  . Years of education: Not on file  . Highest education level: Not on file  Social Needs  . Financial resource strain: Not on file  . Food insecurity - worry: Not on file  . Food insecurity - inability: Not on file  . Transportation needs - medical: Not on  file  . Transportation needs - non-medical: Not on file  Occupational History  . Not on file  Tobacco Use  . Smoking status: Current Every Day Smoker    Packs/day: 0.50    Years: 51.00    Pack years: 25.50    Types: Cigarettes  . Smokeless tobacco: Former Systems developer    Quit date: 11/03/2014  . Tobacco comment: quit 2 weeks ago  Substance and Sexual Activity  . Alcohol use: No    Alcohol/week: 0.0 oz  . Drug use: No  . Sexual activity: No  Other Topics Concern  . Not on file  Social History Narrative  . Not on file   Allergies  Allergen Reactions  . Asa [Aspirin]     angioedema   Family History  Problem Relation Age of Onset  . Hypertension Mother   . Heart attack Sister      Past medical history, social, surgical and family history all reviewed in electronic medical record.  No pertanent information unless stated regarding to the chief complaint.   Review of Systems:Review of systems updated and as accurate as of 06/24/17  No headache, visual changes, nausea, vomiting, diarrhea, constipation, dizziness, abdominal pain, skin rash, fevers, chills, night sweats, weight loss, swollen lymph nodes, body aches, joint swelling, muscle aches, chest pain, shortness of breath, mood changes.   Objective  There were no vitals taken for this visit. Systems examined below  as of 06/24/17   General: No apparent distress alert and oriented x3 mood and affect normal, dressed appropriately.  HEENT: Pupils equal, extraocular movements intact  Respiratory: Patient's speak in full sentences and does not appear short of breath  Cardiovascular: No lower extremity edema, non tender, no erythema  Skin: Warm dry intact with no signs of infection or rash on extremities or on axial skeleton.  Abdomen: Soft nontender  Neuro: Cranial nerves II through XII are intact, neurovascularly intact in all extremities with 2+ DTRs and 2+ pulses.  Lymph: No lymphadenopathy of posterior or anterior cervical chain or  axillae bilaterally.  Gait normal with good balance and coordination.  MSK:  Non tender with full range of motion and good stability and symmetric strength and tone of elbows,  hip, knee and ankles bilaterally.  Right shoulder exam shows atrophy noted.  Crepitus with range of motion.  Decreased by 5-10 degrees in all planes.  4 out of 5 strength of the rotator cuff compared to contralateral side. Left hand exam shows osteoarthritic changes.  Positive Tinel sign.  Mild weakness in grip strength compared to contralateral side.  Procedure: Real-time Ultrasound Guided Injection of right glenohumeral joint Device: GE Logiq Q7  Ultrasound guided injection is preferred based studies that show increased duration, increased effect, greater accuracy, decreased procedural pain, increased response rate with ultrasound guided versus blind injection.  Verbal informed consent obtained.  Time-out conducted.  Noted no overlying erythema, induration, or other signs of local infection.  Skin prepped in a sterile fashion.  Local anesthesia: Topical Ethyl chloride.  With sterile technique and under real time ultrasound guidance:  Joint visualized.  23g 1  inch needle inserted posterior approach. Pictures taken for needle placement. Patient did have injection of 2 cc of 1% lidocaine, 2 cc of 0.5% Marcaine, and 1.0 cc of Kenalog 40 mg/dL. Completed without difficulty  Pain immediately resolved suggesting accurate placement of the medication.  Advised to call if fevers/chills, erythema, induration, drainage, or persistent bleeding.  Images permanently stored and available for review in the ultrasound unit.  Impression: Technically successful ultrasound guided injection.  Procedure: Real-time Ultrasound Guided Injection of leftcarpal tunnel Device: GE Logiq Q7 Ultrasound guided injection is preferred based studies that show increased duration, increased effect, greater accuracy, decreased procedural pain, increased  response rate with ultrasound guided versus blind injection.  Verbal informed consent obtained.  Time-out conducted.  Noted no overlying erythema, induration, or other signs of local infection.  Skin prepped in a sterile fashion.  Local anesthesia: Topical Ethyl chloride.  With sterile technique and under real time ultrasound guidance:  median nerve visualized.  23g 5/8 inch needle inserted distal to proximal approach into nerve sheath. Pictures taken nfor needle placement. Patient did have injection of 2 cc of 0.5% Marcaine, and 1 cc of Kenalog 40 mg/dL. Completed without difficulty  Pain immediately resolved suggesting accurate placement of the medication.  Advised to call if fevers/chills, erythema, induration, drainage, or persistent bleeding.  Images permanently stored and available for review in the ultrasound unit.  Impression: Technically successful ultrasound guided injection.    Impression and Recommendations:     This case required medical decision making of moderate complexity.      Note: This dictation was prepared with Dragon dictation along with smaller phrase technology. Any transcriptional errors that result from this process are unintentional.

## 2017-06-25 ENCOUNTER — Ambulatory Visit: Payer: Self-pay

## 2017-06-25 ENCOUNTER — Encounter: Payer: Self-pay | Admitting: Family Medicine

## 2017-06-25 ENCOUNTER — Other Ambulatory Visit: Payer: Self-pay | Admitting: Internal Medicine

## 2017-06-25 ENCOUNTER — Ambulatory Visit: Payer: Medicare Other | Admitting: Family Medicine

## 2017-06-25 VITALS — BP 120/70 | HR 70 | Ht 69.0 in | Wt 207.0 lb

## 2017-06-25 DIAGNOSIS — M12811 Other specific arthropathies, not elsewhere classified, right shoulder: Secondary | ICD-10-CM

## 2017-06-25 DIAGNOSIS — G5602 Carpal tunnel syndrome, left upper limb: Secondary | ICD-10-CM | POA: Diagnosis not present

## 2017-06-25 DIAGNOSIS — M5416 Radiculopathy, lumbar region: Secondary | ICD-10-CM

## 2017-06-25 DIAGNOSIS — M25532 Pain in left wrist: Secondary | ICD-10-CM

## 2017-06-25 DIAGNOSIS — M75101 Unspecified rotator cuff tear or rupture of right shoulder, not specified as traumatic: Secondary | ICD-10-CM

## 2017-06-25 DIAGNOSIS — M159 Polyosteoarthritis, unspecified: Secondary | ICD-10-CM

## 2017-06-25 MED ORDER — OXYCODONE-ACETAMINOPHEN 10-325 MG PO TABS: 1.0000 | ORAL_TABLET | Freq: Three times a day (TID) | ORAL | 0 refills | Status: DC | PRN

## 2017-06-26 NOTE — Assessment & Plan Note (Signed)
Repeat injection June 26, 2017.  Seems to be annual.  Discussed icing regimen and home exercises.  Discussed which activities to do which wants to avoid.  Follow-up again in 6 weeks.

## 2017-06-26 NOTE — Assessment & Plan Note (Signed)
Patient injected today.  Tolerated the procedure well.  Can repeat every 3 months if needed.  Patient would not want to have any surgical intervention.  Hopefully patient does well.  Follow-up again in 6-8 weeks

## 2017-07-05 ENCOUNTER — Other Ambulatory Visit: Payer: Self-pay | Admitting: Internal Medicine

## 2017-07-05 DIAGNOSIS — I10 Essential (primary) hypertension: Secondary | ICD-10-CM

## 2017-07-24 ENCOUNTER — Telehealth: Payer: Self-pay | Admitting: Internal Medicine

## 2017-07-24 DIAGNOSIS — M159 Polyosteoarthritis, unspecified: Secondary | ICD-10-CM

## 2017-07-24 DIAGNOSIS — M5416 Radiculopathy, lumbar region: Secondary | ICD-10-CM

## 2017-07-24 NOTE — Telephone Encounter (Signed)
Copied from [place] [id number]. Topic: Quick Communication - Rx Refill/Question >> Jul 24, 2017 10:44 AM Neva Seat wrote: oxyCODONE-acetaminophen (PERCOCET) 10-325 MG tablet  Pt needing refill.  [id number] [address] [place] [address] Phone: [phone] Fax: [phone]

## 2017-07-24 NOTE — Telephone Encounter (Signed)
LOV: 11/26/16  Dr. Ronnald Ramp  [facility]

## 2017-07-26 NOTE — Telephone Encounter (Signed)
Pt needs an appt for refills.  

## 2017-07-26 NOTE — Telephone Encounter (Signed)
Tried to call pt. We are not able to fill this controled substance until pt is seen in office.

## 2017-07-26 NOTE — Telephone Encounter (Signed)
Patient has called into the [facility] requesting clarification in regard to his refill.  Please follow up in regard.

## 2017-07-26 NOTE — Telephone Encounter (Signed)
This request is refused due to the new STOP Act.

## 2017-07-26 NOTE — Telephone Encounter (Signed)
Patient scheduled appt 08/09/17 which is Dr. Ronnald Ramp next availability. He would like enough medication to last him until appt. Please advise.   [id number] [address], [place] [address]  Phone: [phone] Fax: [phone]

## 2017-07-29 ENCOUNTER — Other Ambulatory Visit: Payer: Self-pay | Admitting: Internal Medicine

## 2017-07-29 DIAGNOSIS — M5416 Radiculopathy, lumbar region: Secondary | ICD-10-CM

## 2017-07-29 DIAGNOSIS — M159 Polyosteoarthritis, unspecified: Secondary | ICD-10-CM

## 2017-07-29 MED ORDER — OXYCODONE-ACETAMINOPHEN 10-325 MG PO TABS: 1.0000 | ORAL_TABLET | Freq: Three times a day (TID) | ORAL | 0 refills | Status: DC | PRN

## 2017-07-29 NOTE — Telephone Encounter (Signed)
Pt contacted and informed rx was sent in.

## 2017-07-29 NOTE — Telephone Encounter (Signed)
Pt called and stated that he is a lot of pain. Pt does have an appt scheduled for 08/09/2017.  Pt is rq a short rx called in to get him to the appt. He last pain pill that he took was on 07/24/2017. Please advise.

## 2017-07-29 NOTE — Telephone Encounter (Signed)
RX sent

## 2017-08-07 ENCOUNTER — Ambulatory Visit: Payer: Medicare Other | Admitting: Family Medicine

## 2017-08-07 ENCOUNTER — Ambulatory Visit: Payer: Self-pay

## 2017-08-07 ENCOUNTER — Encounter: Payer: Self-pay | Admitting: Family Medicine

## 2017-08-07 VITALS — BP 140/84 | HR 84 | Ht 69.0 in | Wt 200.0 lb

## 2017-08-07 DIAGNOSIS — M25511 Pain in right shoulder: Principal | ICD-10-CM

## 2017-08-07 DIAGNOSIS — M1611 Unilateral primary osteoarthritis, right hip: Secondary | ICD-10-CM | POA: Diagnosis not present

## 2017-08-07 DIAGNOSIS — M12811 Other specific arthropathies, not elsewhere classified, right shoulder: Secondary | ICD-10-CM

## 2017-08-07 DIAGNOSIS — G8929 Other chronic pain: Secondary | ICD-10-CM | POA: Diagnosis not present

## 2017-08-07 DIAGNOSIS — M75101 Unspecified rotator cuff tear or rupture of right shoulder, not specified as traumatic: Secondary | ICD-10-CM

## 2017-08-07 NOTE — Assessment & Plan Note (Signed)
Patient given injection.  Tolerated procedure well.  Discussed icing regimen and home exercises.  Discussed which activities to do which wants to avoid.  Follow-up again 10-12 weeks

## 2017-08-07 NOTE — Progress Notes (Signed)
[name] [address] [place], [place] [address] Phone: [phone] Subjective:      CC: Right shoulder pain, right hip pain  TDD:[id number]  Adrian Miller is a 77 y.o. male coming in with complaint of right shoulder pain.  Known to have some mild rotator cuff arthropathy.  Has been injected multiple times.  Last injection was February 2019.  Worsening symptoms at this point.  Discussed icing regimen.  Patient has been doing that only occasionally.  Patient feels that injections are the only things that are helpful at this time.  Has been doing some large projects around the house.   Right hip pain.  Intra-articular.  Known to have hip arthritis.  Worsening symptoms at this time.  Supposed to be getting a hip replacement but continues to avoid it.  Patient states it is difficult to ambulate on a regular basis.    Past Medical History:  Diagnosis Date  . Acute esophagitis 06/01/2015  . Benign essential HTN 05/31/2015  . Duodenal ulcer hemorrhage   . Esophageal stricture 06/01/2015  . Hiatal hernia 06/01/2015   Past Surgical History:  Procedure Laterality Date  . ESOPHAGOGASTRODUODENOSCOPY N/A 05/31/2015   Procedure: ESOPHAGOGASTRODUODENOSCOPY (EGD);  Surgeon: Ladene Artist, MD;  Location: [facility];  Service: Endoscopy;  Laterality: N/A;  . HERNIA REPAIR  1975  . LEFT ROTATOR CUFF REPAIR  2002  . REPLACEMENT TOTAL KNEE Left   . TOTAL KNEE ARTHROPLASTY Right 02/22/2015   Procedure: TOTAL KNEE ARTHROPLASTY;  Surgeon: Melrose Nakayama, MD;  Location: [facility];  Service: Orthopedics;  Laterality: Right;   Social History   Socioeconomic History  . Marital status: Single    Spouse name: Not on file  . Number of children: Not on file  . Years of education: Not on file  . Highest education level: Not on file  Occupational History  . Not on file  Social Needs  . Financial resource strain: Not on file  . Food insecurity:    Worry: Not on file   Inability: Not on file  . Transportation needs:    Medical: Not on file    Non-medical: Not on file  Tobacco Use  . Smoking status: Current Every Day Smoker    Packs/day: 0.50    Years: 51.00    Pack years: 25.50    Types: Cigarettes  . Smokeless tobacco: Former Systems developer    Quit date: 11/03/2014  . Tobacco comment: quit 2 weeks ago  Substance and Sexual Activity  . Alcohol use: No    Alcohol/week: 0.0 oz  . Drug use: No  . Sexual activity: Never  Lifestyle  . Physical activity:    Days per week: Not on file    Minutes per session: Not on file  . Stress: Not on file  Relationships  . Social connections:    Talks on phone: Not on file    Gets together: Not on file    Attends religious service: Not on file    Active member of club or organization: Not on file    Attends meetings of clubs or organizations: Not on file    Relationship status: Not on file  Other Topics Concern  . Not on file  Social History Narrative  . Not on file   Allergies  Allergen Reactions  . Asa [Aspirin]     angioedema   Family History  Problem Relation Age of Onset  . Hypertension Mother   . Heart attack Sister  Past medical history, social, surgical and family history all reviewed in electronic medical record.  No pertanent information unless stated regarding to the chief complaint.   Review of Systems:Review of systems updated and as accurate as of 08/07/17  No headache, visual changes, nausea, vomiting, diarrhea, constipation, dizziness, abdominal pain, skin rash, fevers, chills, night sweats, weight loss, swollen lymph nodes, body aches, joint swelling, muscle aches, chest pain, shortness of breath, mood changes.   Objective  Blood pressure 140/84, height 5\' 9"  (1.753 m), weight 200 lb (90.7 kg). Systems examined below as of 08/07/17   General: No apparent distress alert and oriented x3 mood and affect normal, dressed appropriately.  HEENT: Pupils equal, extraocular movements intact    Respiratory: Patient's speak in full sentences and does not appear short of breath  Cardiovascular: Trace lower extremity edema, non tender, no erythema  Skin: Warm dry intact with no signs of infection or rash on extremities or on axial skeleton.  Abdomen: Soft nontender  Neuro: Cranial nerves II through XII are intact, neurovascularly intact in all extremities with 2+ DTRs and 2+ pulses.  Lymph: No lymphadenopathy of posterior or anterior cervical chain or axillae bilaterally.  Gait antalgic gait MSK:  Non tender with full range of motion and good stability and symmetric strength and tone of  elbows, wrist, hip, knee and ankles bilaterally.  Arthritic changes of multiple joints Right shoulder does have some arthritic changes.  Patient does have some mild crepitus.  Does not seem to have a fullness of the right shoulder.  Positive impingement.  4 out of 5 strength compared to the contralateral side  Right hip has severe decreased range of motion with only 0 degrees of internal rotation.  Crepitus noted.  Does hold his leg in an external position with standing and walking  Procedure: Real-time Ultrasound Guided Injection of right glenohumeral joint Device: GE Logiq Q7  Ultrasound guided injection is preferred based studies that show increased duration, increased effect, greater accuracy, decreased procedural pain, increased response rate with ultrasound guided versus blind injection.  Verbal informed consent obtained.  Time-out conducted.  Noted no overlying erythema, induration, or other signs of local infection.  Skin prepped in a sterile fashion.  Local anesthesia: Topical Ethyl chloride.  With sterile technique and under real time ultrasound guidance:  Joint visualized.  23g 1  inch needle inserted posterior approach. Pictures taken for needle placement. Patient did have injection of 2 cc of 1% lidocaine, 2 cc of 0.5% Marcaine, and 1.0 cc of Kenalog 40 mg/dL. Completed without difficulty   Pain immediately resolved suggesting accurate placement of the medication.  Advised to call if fevers/chills, erythema, induration, drainage, or persistent bleeding.  Images permanently stored and available for review in the ultrasound unit.  Impression: Technically successful ultrasound guided injection.  Procedure: Real-time Ultrasound Guided Injection of right hip Device: GE Logiq Q7  Ultrasound guided injection is preferred based studies that show increased duration, increased effect, greater accuracy, decreased procedural pain, increased response rate with ultrasound guided versus blind injection.  Verbal informed consent obtained.  Time-out conducted.  Noted no overlying erythema, induration, or other signs of local infection.  Skin prepped in a sterile fashion.  Local anesthesia: Topical Ethyl chloride.  With sterile technique and under real time ultrasound guidance:  Anterior capsule visualized, needle visualized going to the head neck junction at the anterior capsule. Pictures taken. Patient did have injection of  3 cc of 0.5% Marcaine, and 1 cc of Kenalog 40 mg/dL. Completed  without difficulty  Pain immediately resolved suggesting accurate placement of the medication.  Advised to call if fevers/chills, erythema, induration, drainage, or persistent bleeding.  Images permanently stored and available for review in the ultrasound unit.  Impression: Technically successful ultrasound guided injection.    Impression and Recommendations:     This case required medical decision making of moderate complexity.      Note: This dictation was prepared with Dragon dictation along with smaller phrase technology. Any transcriptional errors that result from this process are unintentional.

## 2017-08-07 NOTE — Assessment & Plan Note (Signed)
Repeat injection.  Discussed icing regimen and home exercise.  Discussed which activities to do which wants to avoid.  Patient can follow-up in 10-12 weeks.  Does not want any surgical intervention.

## 2017-08-07 NOTE — Patient Instructions (Signed)
Good to see you  You know where I am  See me when you need me

## 2017-08-09 ENCOUNTER — Other Ambulatory Visit (INDEPENDENT_AMBULATORY_CARE_PROVIDER_SITE_OTHER): Payer: Medicare Other

## 2017-08-09 ENCOUNTER — Encounter: Payer: Self-pay | Admitting: Internal Medicine

## 2017-08-09 ENCOUNTER — Ambulatory Visit: Payer: Medicare Other | Admitting: Internal Medicine

## 2017-08-09 VITALS — BP 138/70 | HR 62 | Temp 97.7°F | Resp 16 | Ht 69.0 in | Wt 201.0 lb

## 2017-08-09 DIAGNOSIS — D510 Vitamin B12 deficiency anemia due to intrinsic factor deficiency: Secondary | ICD-10-CM

## 2017-08-09 DIAGNOSIS — Z79891 Long term (current) use of opiate analgesic: Secondary | ICD-10-CM

## 2017-08-09 DIAGNOSIS — D508 Other iron deficiency anemias: Secondary | ICD-10-CM | POA: Diagnosis not present

## 2017-08-09 DIAGNOSIS — R7303 Prediabetes: Secondary | ICD-10-CM

## 2017-08-09 DIAGNOSIS — I10 Essential (primary) hypertension: Secondary | ICD-10-CM | POA: Diagnosis not present

## 2017-08-09 LAB — COMPREHENSIVE METABOLIC PANEL
ALT: 18 U/L (ref 0–53)
AST: 20 U/L (ref 0–37)
Albumin: 4.4 g/dL (ref 3.5–5.2)
Alkaline Phosphatase: 75 U/L (ref 39–117)
BUN: 31 mg/dL — ABNORMAL HIGH (ref 6–23)
CO2: 25 mEq/L (ref 19–32)
Calcium: 9.9 mg/dL (ref 8.4–10.5)
Chloride: 102 mEq/L (ref 96–112)
Creatinine, Ser: 1.18 mg/dL (ref 0.40–1.50)
GFR: 77.13 mL/min (ref >60.00)
Glucose, Bld: 108 mg/dL — ABNORMAL HIGH (ref 70–99)
Potassium: 4 mEq/L (ref 3.5–5.1)
Sodium: 137 mEq/L (ref 135–145)
Total Bilirubin: 0.5 mg/dL (ref 0.2–1.2)
Total Protein: 7.7 g/dL (ref 6.0–8.3)

## 2017-08-09 LAB — CBC WITH DIFFERENTIAL/PLATELET
Basophils Absolute: 0 10*3/uL (ref 0.0–0.1)
Basophils Relative: 0.3 % (ref 0.0–3.0)
Eosinophils Absolute: 0 10*3/uL (ref 0.0–0.7)
Eosinophils Relative: 0 % (ref 0.0–5.0)
HCT: 42.4 % (ref 39.0–52.0)
Hemoglobin: 14.2 g/dL (ref 13.0–17.0)
Lymphocytes Relative: 13.8 % (ref 12.0–46.0)
Lymphs Abs: 2 10*3/uL (ref 0.7–4.0)
MCHC: 33.6 g/dL (ref 30.0–36.0)
MCV: 93.4 fl (ref 78.0–100.0)
Monocytes Absolute: 1 10*3/uL (ref 0.1–1.0)
Monocytes Relative: 7.1 % (ref 3.0–12.0)
Neutro Abs: 11.3 10*3/uL — ABNORMAL HIGH (ref 1.4–7.7)
Neutrophils Relative %: 78.8 % — ABNORMAL HIGH (ref 43.0–77.0)
Platelets: 373 10*3/uL (ref 150.0–400.0)
RBC: 4.54 Mil/uL (ref 4.22–5.81)
RDW: 14.8 % (ref 11.5–15.5)
WBC: 14.3 10*3/uL — ABNORMAL HIGH (ref 4.0–10.5)

## 2017-08-09 LAB — HEMOGLOBIN A1C: Hgb A1c MFr Bld: 6.2 % (ref 4.6–6.5)

## 2017-08-09 NOTE — Patient Instructions (Signed)

## 2017-08-09 NOTE — Progress Notes (Signed)
Subjective:  Patient ID: Adrian Miller, male    DOB: 1940-12-10  Age: 77 y.o. MRN: [id number]  CC: Hypertension and Osteoarthritis   HPI Adrian Miller presents for f/up he continues to complain of chronic musculoskeletal pain but gets symptom relief with the occasional dose of Percocet.  He tells me his blood pressure has been well controlled.  He has had no recent episodes of chest pain, shortness of breath, palpitations, edema, or fatigue.  Outpatient Medications Prior to Visit  Medication Sig Dispense Refill  . atorvastatin (LIPITOR) 20 MG tablet Take 1 tablet (20 mg total) by mouth daily. 90 tablet 3  . chlorthalidone (HYGROTON) 25 MG tablet TAKE 1 TABLET BY MOUTH EVERY DAY 90 tablet 0  . cyanocobalamin 2000 MCG tablet Take 1 tablet (2,000 mcg total) by mouth daily. 90 tablet 3  . ferrous sulfate 325 (65 FE) MG tablet Take 1 tablet (325 mg total) by mouth 3 (three) times daily with meals. (Patient taking differently: Take 325 mg by mouth 2 (two) times daily with a meal. ) 90 tablet 5  . furosemide (LASIX) 20 MG tablet Take 1 tablet (20 mg total) by mouth daily as needed for edema. 30 tablet 3  . gabapentin (NEURONTIN) 100 MG capsule Take 2 capsules (200 mg total) by mouth at bedtime. 60 capsule 3  . irbesartan (AVAPRO) 300 MG tablet Take 1 tablet (300 mg total) by mouth daily. 90 tablet 1  . oxyCODONE-acetaminophen (PERCOCET) 10-325 MG tablet Take 1 tablet by mouth every 8 (eight) hours as needed for pain. 90 tablet 0  . pantoprazole (PROTONIX) 40 MG tablet Take 1 tablet (40 mg total) by mouth daily. 30 tablet 0   No facility-administered medications prior to visit.     ROS Review of Systems  Constitutional: Negative for appetite change, diaphoresis, fatigue and unexpected weight change.  HENT: Negative.   Eyes: Negative for visual disturbance.  Respiratory: Negative for cough, chest tightness, shortness of breath and wheezing.   Cardiovascular: Negative for chest pain,  palpitations and leg swelling.  Gastrointestinal: Negative for abdominal pain, constipation, diarrhea, nausea and vomiting.  Genitourinary: Negative.  Negative for difficulty urinating.  Musculoskeletal: Positive for arthralgias and back pain. Negative for neck pain and neck stiffness.  Skin: Negative.  Negative for color change, pallor and rash.  Neurological: Negative.  Negative for dizziness, weakness, light-headedness and numbness.  Hematological: Negative for adenopathy. Does not bruise/bleed easily.  Psychiatric/Behavioral: Negative.     Objective:  BP 138/70 (BP Location: Left Arm, Patient Position: Sitting, Cuff Size: Normal)   Pulse 62   Temp 97.7 F (36.5 C) (Oral)   Resp 16   Ht 5\' 9"  (1.753 m)   Wt 201 lb (91.2 kg)   SpO2 99%   BMI 29.68 kg/m   BP Readings from Last 3 Encounters:  08/09/17 138/70  08/07/17 140/84  06/25/17 120/70    Wt Readings from Last 3 Encounters:  08/09/17 201 lb (91.2 kg)  08/07/17 200 lb (90.7 kg)  06/25/17 207 lb (93.9 kg)    Physical Exam  Constitutional: He is oriented to person, place, and time. No distress.  HENT:  Mouth/Throat: Oropharynx is clear and moist. No oropharyngeal exudate.  Eyes: Conjunctivae are normal. Left eye exhibits no discharge. No scleral icterus.  Neck: Normal range of motion. Neck supple. No JVD present. No thyromegaly present.  Cardiovascular: Normal rate, regular rhythm and normal heart sounds. Exam reveals no gallop.  No murmur heard. Pulmonary/Chest: Effort normal and  breath sounds normal. No respiratory distress. He has no wheezes. He has no rales.  Abdominal: Soft. Bowel sounds are normal. He exhibits no distension and no mass. There is no tenderness. There is no guarding.  Musculoskeletal: Normal range of motion. He exhibits no edema, tenderness or deformity.  Lymphadenopathy:    He has no cervical adenopathy.  Neurological: He is alert and oriented to person, place, and time.  Skin: Skin is warm and  dry. No rash noted. He is not diaphoretic. No erythema. No pallor.  Vitals reviewed.   Lab Results  Component Value Date   WBC 14.3 (H) 08/09/2017   HGB 14.2 08/09/2017   HCT 42.4 08/09/2017   PLT 373.0 08/09/2017   GLUCOSE 108 (H) 08/09/2017   CHOL 134 11/26/2016   TRIG 183.0 (H) 11/26/2016   HDL 30.50 (L) 11/26/2016   LDLCALC 67 11/26/2016   ALT 18 08/09/2017   AST 20 08/09/2017   NA 137 08/09/2017   K 4.0 08/09/2017   CL 102 08/09/2017   CREATININE 1.18 08/09/2017   BUN 31 (H) 08/09/2017   CO2 25 08/09/2017   TSH 1.42 11/26/2016   PSA 0.87 03/18/2014   INR 1.08 02/11/2015   HGBA1C 6.2 08/09/2017    Dg Chest 2 View  Result Date: 03/12/2017 CLINICAL DATA:  Upper mid chest pain for a few hours. Shortness of breath and weakness. Smoker. EXAM: CHEST  2 VIEW COMPARISON:  12/18/2015 FINDINGS: Slightly shallow inspiration. Heart size and pulmonary vascularity are normal for technique. No airspace disease or consolidation in the lungs. No blunting of costophrenic angles. No pneumothorax. Degenerative changes in the spine and shoulders. IMPRESSION: No evidence of active pulmonary disease. Electronically Signed   By: Lucienne Capers M.D.   On: 03/12/2017 01:00    Assessment & Plan:   Adrian Miller was seen today for hypertension and osteoarthritis.  Diagnoses and all orders for this visit:  Vitamin B12 deficiency anemia due to intrinsic factor deficiency- His H&H are normal.  Will continue high-dose oral B12 supplementation. -     CBC with Differential/Platelet; Future  Benign essential HTN- His blood pressure is well controlled.  Electrolytes and renal function are normal. -     Comprehensive metabolic panel; Future  Other iron deficiency anemias- His H&H are normal now.  Will continue iron replacement therapy. -     CBC with Differential/Platelet; Future  Prediabetes- His hemoglobin A1c is at 6.2%.  Medical therapy is not indicated. -     Comprehensive metabolic panel; Future -      Hemoglobin A1c; Future  Encounter for long-term use of opiate analgesic- I will check his urine drug screen to document compliance and to screen for substance abuse. -     Pain Mgmt, Profile 8 w/Conf, U; Future   I am having Adrian Miller maintain his ferrous sulfate, cyanocobalamin, pantoprazole, atorvastatin, furosemide, gabapentin, irbesartan, chlorthalidone, and oxyCODONE-acetaminophen.  No orders of the defined types were placed in this encounter.    Follow-up: Return in about 6 months (around 02/08/2018).  Scarlette Calico, MD

## 2017-08-12 LAB — PAIN MGMT, PROFILE 8 W/CONF, U
6 Acetylmorphine: NEGATIVE ng/mL (ref <10)
Alcohol Metabolites: NEGATIVE ng/mL (ref <500)
Amphetamines: NEGATIVE ng/mL (ref <500)
Benzodiazepines: NEGATIVE ng/mL (ref <100)
Buprenorphine, Urine: NEGATIVE ng/mL (ref <5)
Cocaine Metabolite: NEGATIVE ng/mL (ref <150)
Codeine: NEGATIVE ng/mL (ref <50)
Creatinine: 96.8 mg/dL
Hydrocodone: NEGATIVE ng/mL (ref <50)
Hydromorphone: NEGATIVE ng/mL (ref <50)
MDMA: NEGATIVE ng/mL (ref <500)
Marijuana Metabolite: NEGATIVE ng/mL (ref <20)
Morphine: NEGATIVE ng/mL (ref <50)
Norhydrocodone: NEGATIVE ng/mL (ref <50)
Noroxycodone: 1705 ng/mL — ABNORMAL HIGH (ref <50)
Opiates: NEGATIVE ng/mL (ref <100)
Oxidant: NEGATIVE ug/mL (ref <200)
Oxycodone: 1312 ng/mL — ABNORMAL HIGH (ref <50)
Oxycodone: POSITIVE ng/mL — AB (ref <100)
Oxymorphone: 671 ng/mL — ABNORMAL HIGH (ref <50)
pH: 4.92 (ref 4.5–9.0)

## 2017-08-27 ENCOUNTER — Other Ambulatory Visit: Payer: Self-pay | Admitting: Internal Medicine

## 2017-08-27 DIAGNOSIS — M159 Polyosteoarthritis, unspecified: Secondary | ICD-10-CM

## 2017-08-27 DIAGNOSIS — I10 Essential (primary) hypertension: Secondary | ICD-10-CM

## 2017-08-27 DIAGNOSIS — M5416 Radiculopathy, lumbar region: Secondary | ICD-10-CM

## 2017-08-27 MED ORDER — IRBESARTAN 300 MG PO TABS: 300.0000 mg | ORAL_TABLET | Freq: Every day | ORAL | 1 refills | Status: DC

## 2017-08-27 NOTE — Telephone Encounter (Signed)
Copied from [place] [id number]. Topic: Quick Communication - Rx Refill/Question >> Aug 27, 2017  8:53 AM Wynetta Emery, [place] C wrote:  Medication: oxyCODONE-acetaminophen (PERCOCET) 10-325 MG tablet, irbesartan (AVAPRO) 300 MG tablet   Has the patient contacted their pharmacy? No   (Agent: If no, request that the patient contact the pharmacy for the refill.)  Preferred Pharmacy (with phone number or street name): [id number] [address], [place] [phone] (Phone) [phone] (Fax)     Agent: Please be advised that RX refills may take up to 3 business days. We ask that you follow-up with your pharmacy.

## 2017-08-27 NOTE — Telephone Encounter (Signed)
Patient called to ask how many percocet he's taking, since it was filled last with 90 tabs on 07/29/17, he says he takes 2-3/day. Advised the request will be sent to Dr. Ronnald Ramp.  Percocet refill Last OV: 08/09/17 Last Refill:07/29/17 90 tab/0 refill Pharmacy: [id number] [address], [place] (Phone) [phone] (Fax)

## 2017-08-27 NOTE — Telephone Encounter (Signed)
Not a patient of [place].

## 2017-08-28 MED ORDER — OXYCODONE-ACETAMINOPHEN 10-325 MG PO TABS: 1.0000 | ORAL_TABLET | Freq: Three times a day (TID) | ORAL | 0 refills | Status: DC | PRN

## 2017-09-25 ENCOUNTER — Telehealth: Payer: Self-pay | Admitting: Internal Medicine

## 2017-09-25 NOTE — Telephone Encounter (Signed)
Pt is coming due for a med follow up appt.

## 2017-09-25 NOTE — Telephone Encounter (Signed)
Copied from [place] [id number]. Topic: Quick Communication - Rx Refill/Question >> Sep 25, 2017 10:55 AM Antonieta Iba C wrote: Medication:  oxyCODONE-acetaminophen (PERCOCET) 10-325 MG tablet  Has the patient contacted their pharmacy? no  (Agent: If no, request that the patient contact the pharmacy for the refill.) (Agent: If yes, when and what did the pharmacy advise?)  Preferred Pharmacy (with phone number or street name): [id number] [address], [place] [phone] (Phone) [phone] (Fax)      Agent: Please be advised that RX refills may take up to 3 business days. We ask that you follow-up with your pharmacy.

## 2017-09-26 ENCOUNTER — Telehealth: Payer: Self-pay | Admitting: Internal Medicine

## 2017-09-26 NOTE — Telephone Encounter (Signed)
Copied from [place] [id number]. Topic: Quick Communication - See Telephone Encounter >> Sep 26, 2017  2:22 PM [place], Laban Emperor wrote: CRM for notification. See Telephone encounter for: 09/26/17.  Pt would like Dr. Ronnald Ramp to call him about his hemorrhoid.

## 2017-09-26 NOTE — Telephone Encounter (Signed)
Tried to call pt but no vm to leave a message.  

## 2017-09-27 NOTE — Telephone Encounter (Signed)
Called patient.  Appointment scheduled.

## 2017-09-27 NOTE — Telephone Encounter (Signed)
Last visit in April says to come back in 6 months (October). Does patient still need to be seen sooner?

## 2017-09-27 NOTE — Telephone Encounter (Signed)
For pain medication, he should be seen sooner than 6 months.

## 2017-09-28 ENCOUNTER — Emergency Department (HOSPITAL_COMMUNITY): Payer: Medicare Other

## 2017-09-28 ENCOUNTER — Inpatient Hospital Stay (HOSPITAL_COMMUNITY): Payer: Medicare Other

## 2017-09-28 ENCOUNTER — Inpatient Hospital Stay (HOSPITAL_COMMUNITY)
Admission: EM | Admit: 2017-09-28 | Discharge: 2017-10-05 | DRG: 418 | Disposition: A | Discharge status: Home Health | Payer: Medicare Other | Attending: Internal Medicine | Admitting: Internal Medicine

## 2017-09-28 ENCOUNTER — Encounter (HOSPITAL_COMMUNITY): Payer: Self-pay

## 2017-09-28 ENCOUNTER — Other Ambulatory Visit: Payer: Self-pay

## 2017-09-28 DIAGNOSIS — K219 Gastro-esophageal reflux disease without esophagitis: Secondary | ICD-10-CM | POA: Diagnosis not present

## 2017-09-28 DIAGNOSIS — E785 Hyperlipidemia, unspecified: Secondary | ICD-10-CM | POA: Diagnosis not present

## 2017-09-28 DIAGNOSIS — K5900 Constipation, unspecified: Secondary | ICD-10-CM | POA: Diagnosis present

## 2017-09-28 DIAGNOSIS — F17210 Nicotine dependence, cigarettes, uncomplicated: Secondary | ICD-10-CM | POA: Diagnosis present

## 2017-09-28 DIAGNOSIS — Z8249 Family history of ischemic heart disease and other diseases of the circulatory system: Secondary | ICD-10-CM

## 2017-09-28 DIAGNOSIS — R5082 Postprocedural fever: Secondary | ICD-10-CM | POA: Diagnosis not present

## 2017-09-28 DIAGNOSIS — R3915 Urgency of urination: Secondary | ICD-10-CM | POA: Diagnosis present

## 2017-09-28 DIAGNOSIS — R1031 Right lower quadrant pain: Secondary | ICD-10-CM | POA: Diagnosis not present

## 2017-09-28 DIAGNOSIS — R0602 Shortness of breath: Secondary | ICD-10-CM | POA: Diagnosis not present

## 2017-09-28 DIAGNOSIS — K5730 Diverticulosis of large intestine without perforation or abscess without bleeding: Secondary | ICD-10-CM | POA: Diagnosis not present

## 2017-09-28 DIAGNOSIS — R350 Frequency of micturition: Secondary | ICD-10-CM | POA: Diagnosis not present

## 2017-09-28 DIAGNOSIS — I10 Essential (primary) hypertension: Secondary | ICD-10-CM | POA: Diagnosis present

## 2017-09-28 DIAGNOSIS — N179 Acute kidney failure, unspecified: Secondary | ICD-10-CM | POA: Diagnosis present

## 2017-09-28 DIAGNOSIS — I110 Hypertensive heart disease with heart failure: Secondary | ICD-10-CM | POA: Diagnosis present

## 2017-09-28 DIAGNOSIS — R748 Abnormal levels of other serum enzymes: Secondary | ICD-10-CM | POA: Diagnosis not present

## 2017-09-28 DIAGNOSIS — R1011 Right upper quadrant pain: Secondary | ICD-10-CM | POA: Diagnosis not present

## 2017-09-28 DIAGNOSIS — N182 Chronic kidney disease, stage 2 (mild): Secondary | ICD-10-CM

## 2017-09-28 DIAGNOSIS — K449 Diaphragmatic hernia without obstruction or gangrene: Secondary | ICD-10-CM | POA: Diagnosis not present

## 2017-09-28 DIAGNOSIS — K8020 Calculus of gallbladder without cholecystitis without obstruction: Secondary | ICD-10-CM | POA: Diagnosis not present

## 2017-09-28 DIAGNOSIS — K210 Gastro-esophageal reflux disease with esophagitis: Secondary | ICD-10-CM

## 2017-09-28 DIAGNOSIS — N289 Disorder of kidney and ureter, unspecified: Secondary | ICD-10-CM | POA: Diagnosis not present

## 2017-09-28 DIAGNOSIS — R52 Pain, unspecified: Secondary | ICD-10-CM | POA: Diagnosis not present

## 2017-09-28 DIAGNOSIS — K812 Acute cholecystitis with chronic cholecystitis: Secondary | ICD-10-CM | POA: Insufficient documentation

## 2017-09-28 DIAGNOSIS — I5032 Chronic diastolic (congestive) heart failure: Secondary | ICD-10-CM | POA: Diagnosis present

## 2017-09-28 DIAGNOSIS — K8012 Calculus of gallbladder with acute and chronic cholecystitis without obstruction: Secondary | ICD-10-CM | POA: Diagnosis not present

## 2017-09-28 DIAGNOSIS — R1084 Generalized abdominal pain: Secondary | ICD-10-CM

## 2017-09-28 DIAGNOSIS — D509 Iron deficiency anemia, unspecified: Secondary | ICD-10-CM | POA: Diagnosis not present

## 2017-09-28 DIAGNOSIS — R079 Chest pain, unspecified: Secondary | ICD-10-CM | POA: Diagnosis not present

## 2017-09-28 DIAGNOSIS — Z8711 Personal history of peptic ulcer disease: Secondary | ICD-10-CM

## 2017-09-28 DIAGNOSIS — K3189 Other diseases of stomach and duodenum: Secondary | ICD-10-CM | POA: Diagnosis not present

## 2017-09-28 DIAGNOSIS — G894 Chronic pain syndrome: Secondary | ICD-10-CM | POA: Diagnosis present

## 2017-09-28 DIAGNOSIS — M549 Dorsalgia, unspecified: Secondary | ICD-10-CM | POA: Diagnosis present

## 2017-09-28 DIAGNOSIS — K222 Esophageal obstruction: Secondary | ICD-10-CM

## 2017-09-28 DIAGNOSIS — R935 Abnormal findings on diagnostic imaging of other abdominal regions, including retroperitoneum: Secondary | ICD-10-CM | POA: Diagnosis not present

## 2017-09-28 DIAGNOSIS — M25511 Pain in right shoulder: Secondary | ICD-10-CM | POA: Diagnosis present

## 2017-09-28 DIAGNOSIS — Z96653 Presence of artificial knee joint, bilateral: Secondary | ICD-10-CM | POA: Diagnosis present

## 2017-09-28 DIAGNOSIS — R7303 Prediabetes: Secondary | ICD-10-CM | POA: Diagnosis present

## 2017-09-28 DIAGNOSIS — D72829 Elevated white blood cell count, unspecified: Secondary | ICD-10-CM | POA: Diagnosis not present

## 2017-09-28 DIAGNOSIS — M5416 Radiculopathy, lumbar region: Secondary | ICD-10-CM

## 2017-09-28 DIAGNOSIS — K8010 Calculus of gallbladder with chronic cholecystitis without obstruction: Secondary | ICD-10-CM | POA: Diagnosis not present

## 2017-09-28 DIAGNOSIS — R109 Unspecified abdominal pain: Secondary | ICD-10-CM | POA: Diagnosis present

## 2017-09-28 DIAGNOSIS — K838 Other specified diseases of biliary tract: Secondary | ICD-10-CM | POA: Diagnosis not present

## 2017-09-28 DIAGNOSIS — N401 Enlarged prostate with lower urinary tract symptoms: Secondary | ICD-10-CM | POA: Diagnosis present

## 2017-09-28 DIAGNOSIS — M159 Polyosteoarthritis, unspecified: Secondary | ICD-10-CM

## 2017-09-28 DIAGNOSIS — R509 Fever, unspecified: Secondary | ICD-10-CM | POA: Diagnosis not present

## 2017-09-28 DIAGNOSIS — K8021 Calculus of gallbladder without cholecystitis with obstruction: Secondary | ICD-10-CM | POA: Diagnosis not present

## 2017-09-28 DIAGNOSIS — R945 Abnormal results of liver function studies: Secondary | ICD-10-CM | POA: Diagnosis not present

## 2017-09-28 DIAGNOSIS — R0789 Other chest pain: Secondary | ICD-10-CM | POA: Diagnosis not present

## 2017-09-28 LAB — CBC
HCT: 41.3 % (ref 39.0–52.0)
Hemoglobin: 14.4 g/dL (ref 13.0–17.0)
MCH: 31.6 pg (ref 26.0–34.0)
MCHC: 34.9 g/dL (ref 30.0–36.0)
MCV: 90.8 fL (ref 78.0–100.0)
Platelets: 340 10*3/uL (ref 150–400)
RBC: 4.55 MIL/uL (ref 4.22–5.81)
RDW: 13.7 % (ref 11.5–15.5)
WBC: 12.5 10*3/uL — ABNORMAL HIGH (ref 4.0–10.5)

## 2017-09-28 LAB — BASIC METABOLIC PANEL
Anion gap: 13 (ref 5–15)
BUN: 27 mg/dL — ABNORMAL HIGH (ref 6–20)
CO2: 27 mmol/L (ref 22–32)
Calcium: 9.9 mg/dL (ref 8.9–10.3)
Chloride: 96 mmol/L — ABNORMAL LOW (ref 101–111)
Creatinine, Ser: 1.34 mg/dL — ABNORMAL HIGH (ref 0.61–1.24)
GFR calc Af Amer: 58 mL/min — ABNORMAL LOW (ref >60)
GFR calc non Af Amer: 50 mL/min — ABNORMAL LOW (ref >60)
Glucose, Bld: 120 mg/dL — ABNORMAL HIGH (ref 65–99)
Potassium: 4.1 mmol/L (ref 3.5–5.1)
Sodium: 136 mmol/L (ref 135–145)

## 2017-09-28 LAB — HEPATIC FUNCTION PANEL
ALT: 365 U/L — ABNORMAL HIGH (ref 17–63)
AST: 524 U/L — ABNORMAL HIGH (ref 15–41)
Albumin: 4.3 g/dL (ref 3.5–5.0)
Alkaline Phosphatase: 150 U/L — ABNORMAL HIGH (ref 38–126)
Bilirubin, Direct: 0.7 mg/dL — ABNORMAL HIGH (ref 0.1–0.5)
Indirect Bilirubin: 1.7 mg/dL — ABNORMAL HIGH (ref 0.3–0.9)
Total Bilirubin: 2.4 mg/dL — ABNORMAL HIGH (ref 0.3–1.2)
Total Protein: 8 g/dL (ref 6.5–8.1)

## 2017-09-28 LAB — LIPASE, BLOOD: Lipase: 40 U/L (ref 11–51)

## 2017-09-28 LAB — URINALYSIS, ROUTINE W REFLEX MICROSCOPIC
Bilirubin Urine: NEGATIVE
Glucose, UA: NEGATIVE mg/dL
Hgb urine dipstick: NEGATIVE
Ketones, ur: NEGATIVE mg/dL
Leukocytes, UA: NEGATIVE
Nitrite: NEGATIVE
Protein, ur: NEGATIVE mg/dL
Specific Gravity, Urine: 1.036 — ABNORMAL HIGH (ref 1.005–1.030)
pH: 7 (ref 5.0–8.0)

## 2017-09-28 LAB — I-STAT TROPONIN, ED: Troponin i, poc: 0.01 ng/mL (ref 0.00–0.08)

## 2017-09-28 LAB — ACETAMINOPHEN LEVEL: Acetaminophen (Tylenol), Serum: <10 ug/mL — ABNORMAL LOW (ref 10–30)

## 2017-09-28 MED ORDER — ONDANSETRON HCL 4 MG PO TABS: 4.0000 mg | ORAL_TABLET | Freq: Four times a day (QID) | ORAL | Status: DC | PRN

## 2017-09-28 MED ORDER — GI COCKTAIL ~~LOC~~
30.0000 mL | ORAL | Status: AC
  Administered 2017-09-28: 30 mL via ORAL
  Filled 2017-09-28: qty 30

## 2017-09-28 MED ORDER — ACETAMINOPHEN 650 MG RE SUPP: 650.0000 mg | Freq: Four times a day (QID) | RECTAL | Status: DC | PRN

## 2017-09-28 MED ORDER — SODIUM CHLORIDE 0.9 % IV SOLN
INTRAVENOUS | Status: DC
  Administered 2017-09-28 – 2017-09-30 (×5): via INTRAVENOUS

## 2017-09-28 MED ORDER — NICOTINE 21 MG/24HR TD PT24: 21.0000 mg | MEDICATED_PATCH | Freq: Every day | TRANSDERMAL | Status: DC

## 2017-09-28 MED ORDER — IOPAMIDOL (ISOVUE-300) INJECTION 61%
INTRAVENOUS | Status: AC
  Filled 2017-09-28: qty 100

## 2017-09-28 MED ORDER — IOPAMIDOL (ISOVUE-300) INJECTION 61%
100.0000 mL | Freq: Once | INTRAVENOUS | Status: AC | PRN
  Administered 2017-09-28: 100 mL via INTRAVENOUS

## 2017-09-28 MED ORDER — FENTANYL CITRATE (PF) 100 MCG/2ML IJ SOLN
25.0000 ug | Freq: Once | INTRAMUSCULAR | Status: AC
  Administered 2017-09-28: 25 ug via INTRAVENOUS
  Filled 2017-09-28: qty 2

## 2017-09-28 MED ORDER — ALBUTEROL SULFATE (2.5 MG/3ML) 0.083% IN NEBU: 2.5000 mg | INHALATION_SOLUTION | Freq: Four times a day (QID) | RESPIRATORY_TRACT | Status: DC | PRN

## 2017-09-28 MED ORDER — OXYCODONE HCL 5 MG PO TABS
10.0000 mg | ORAL_TABLET | Freq: Three times a day (TID) | ORAL | Status: DC | PRN
  Administered 2017-09-29 – 2017-10-05 (×14): 10 mg via ORAL
  Filled 2017-09-28 (×15): qty 2

## 2017-09-28 MED ORDER — NITROGLYCERIN 0.4 MG SL SUBL: 0.4000 mg | SUBLINGUAL_TABLET | SUBLINGUAL | Status: DC | PRN

## 2017-09-28 MED ORDER — NICOTINE 21 MG/24HR TD PT24
21.0000 mg | MEDICATED_PATCH | Freq: Every day | TRANSDERMAL | Status: DC
  Administered 2017-09-28 – 2017-10-05 (×8): 21 mg via TRANSDERMAL
  Filled 2017-09-28 (×8): qty 1

## 2017-09-28 MED ORDER — FAMOTIDINE IN NACL 20-0.9 MG/50ML-% IV SOLN
20.0000 mg | Freq: Once | INTRAVENOUS | Status: AC
  Administered 2017-09-28: 20 mg via INTRAVENOUS
  Filled 2017-09-28: qty 50

## 2017-09-28 MED ORDER — ONDANSETRON HCL 4 MG/2ML IJ SOLN: 4.0000 mg | Freq: Four times a day (QID) | INTRAMUSCULAR | Status: DC | PRN

## 2017-09-28 MED ORDER — ALUM & MAG HYDROXIDE-SIMETH 200-200-20 MG/5ML PO SUSP: 30.0000 mL | Freq: Once | ORAL | Status: DC

## 2017-09-28 MED ORDER — GADOBENATE DIMEGLUMINE 529 MG/ML IV SOLN
20.0000 mL | Freq: Once | INTRAVENOUS | Status: AC | PRN
  Administered 2017-09-28: 20 mL via INTRAVENOUS

## 2017-09-28 MED ORDER — ENOXAPARIN SODIUM 40 MG/0.4ML ~~LOC~~ SOLN
40.0000 mg | Freq: Every day | SUBCUTANEOUS | Status: DC
  Administered 2017-09-28 – 2017-10-04 (×6): 40 mg via SUBCUTANEOUS
  Filled 2017-09-28 (×6): qty 0.4

## 2017-09-28 NOTE — ED Notes (Signed)
ED TO INPATIENT HANDOFF REPORT  Name/Age/Gender Adrian Miller 77 y.o. male  Code Status    Code Status Orders  (From admission, onward)        Start     Ordered   09/28/17 2018  Full code  Continuous     09/28/17 2021    Code Status History    Date Active Date Inactive Code Status Order ID Comments User Context   12/19/2015 0119 12/19/2015 1516 Full Code [id number]  Reubin Milan, MD Inpatient   12/19/2015 0119 12/19/2015 0119 Full Code [id number]  Reubin Milan, MD Inpatient   05/30/2015 2223 06/02/2015 1212 Full Code [id number]  Etta Quill, DO ED   02/22/2015 1555 02/24/2015 1613 Full Code [id number]  Macie Burows Inpatient   02/06/2012 0822 02/08/2012 1605 Full Code [id number]  Grant Fontana, RN Inpatient      Home/SNF/Other Home     Chief Complaint Chest Pain/ Numbness in Hand and Left Arm   Level of Care/Admitting Diagnosis ED Disposition    ED Disposition Condition [facility] Area: [facility] [[id number]]  Level of Care: Telemetry [5]  Admit to tele based on following criteria: Monitor for Ischemic changes  Diagnosis: Elevated liver enzymes [092957]  Admitting Physician: Norval Morton [[id number]]  Attending Physician: Norval Morton [[id number]]  Estimated length of stay: past midnight tomorrow  Certification:: I certify this patient will need inpatient services for at least 2 midnights  PT Class (Do Not Modify): Inpatient [101]  PT Acc Code (Do Not Modify): Private [1]       Medical History Past Medical History:  Diagnosis Date  . Acute esophagitis 06/01/2015  . Benign essential HTN 05/31/2015  . Duodenal ulcer hemorrhage   . Esophageal stricture 06/01/2015  . Hiatal hernia 06/01/2015    Allergies Allergies  Allergen Reactions  . Asa [Aspirin]     angioedema    IV Location/Drains/Wounds Patient Lines/Drains/Airways Status   Active Line/Drains/Airways    Name:   Placement date:   Placement time:    Site:   Days:   Peripheral IV 09/28/17 Left Antecubital   09/28/17    1515    Antecubital   less than 1          Labs/Imaging Results for orders placed or performed during the hospital encounter of 09/28/17 (from the past 48 hour(s))  Basic metabolic panel     Status: Abnormal   Collection Time: 09/28/17  3:06 PM  Result Value Ref Range   Sodium 136 135 - 145 mmol/L   Potassium 4.1 3.5 - 5.1 mmol/L   Chloride 96 (L) 101 - 111 mmol/L   CO2 27 22 - 32 mmol/L   Glucose, Bld 120 (H) 65 - 99 mg/dL   BUN 27 (H) 6 - 20 mg/dL   Creatinine, Ser 1.34 (H) 0.61 - 1.24 mg/dL   Calcium 9.9 8.9 - 10.3 mg/dL   GFR calc non Af Amer 50 (L) >60 mL/min   GFR calc Af Amer 58 (L) >60 mL/min    Comment: (NOTE) The eGFR has been calculated using the CKD EPI equation. This calculation has not been validated in all clinical situations. eGFR's persistently <60 mL/min signify possible Chronic Kidney Disease.    Anion gap 13 5 - 15    Comment: Performed at [facility], [place] [address], [place], [place] [address]  CBC     Status: Abnormal   Collection Time: 09/28/17  3:06 PM  Result Value Ref Range   WBC 12.5 (H) 4.0 - 10.5 K/uL   RBC 4.55 4.22 - 5.81 MIL/uL   Hemoglobin 14.4 13.0 - 17.0 g/dL   HCT 41.3 39.0 - 52.0 %   MCV 90.8 78.0 - 100.0 fL   MCH 31.6 26.0 - 34.0 pg   MCHC 34.9 30.0 - 36.0 g/dL   RDW 13.7 11.5 - 15.5 %   Platelets 340 150 - 400 K/uL    Comment: Performed at [facility], [place] [address] [place], [place] [address]  I-stat troponin, ED     Status: None   Collection Time: 09/28/17  3:19 PM  Result Value Ref Range   Troponin i, poc 0.01 0.00 - 0.08 ng/mL   Comment 3            Comment: Due to the release kinetics of cTnI, a negative result within the first hours of the onset of symptoms does not rule out myocardial infarction with certainty. If myocardial infarction is still suspected, repeat the test at appropriate intervals.   Hepatic  function panel     Status: Abnormal   Collection Time: 09/28/17  4:54 PM  Result Value Ref Range   Total Protein 8.0 6.5 - 8.1 g/dL   Albumin 4.3 3.5 - 5.0 g/dL   AST 524 (H) 15 - 41 U/L   ALT 365 (H) 17 - 63 U/L   Alkaline Phosphatase 150 (H) 38 - 126 U/L   Total Bilirubin 2.4 (H) 0.3 - 1.2 mg/dL   Bilirubin, Direct 0.7 (H) 0.1 - 0.5 mg/dL   Indirect Bilirubin 1.7 (H) 0.3 - 0.9 mg/dL    Comment: Performed at [facility], [place] [address], [place], [place] [address]  Lipase, blood     Status: None   Collection Time: 09/28/17  4:54 PM  Result Value Ref Range   Lipase 40 11 - 51 U/L    Comment: Performed at [facility], [place] [address], [place], [place] [address]  Acetaminophen level     Status: Abnormal   Collection Time: 09/28/17  8:22 PM  Result Value Ref Range   Acetaminophen (Tylenol), Serum <10 (L) 10 - 30 ug/mL    Comment: (NOTE) Therapeutic concentrations vary significantly. A range of 10-30 ug/mL  may be an effective concentration for many patients. However, some  are best treated at concentrations outside of this range. Acetaminophen concentrations >150 ug/mL at 4 hours after ingestion  and >50 ug/mL at 12 hours after ingestion are often associated with  toxic reactions. Performed at [facility], [place] [address] [place], [place] [address]    Ct Abdomen Pelvis W Contrast  Result Date: 09/28/2017 CLINICAL DATA:  Patient does have history of ulcer disease and acute esophagitis. He says this feels different than that. He reports about 4 AM he woke up and that pain is been continued since then. Patient took his chronic pain medication at home and did not get better. On arrival patient had a 10 out of 10 pain, however it is since feels better after lying still EXAM: CT ABDOMEN AND PELVIS WITH CONTRAST TECHNIQUE: Multidetector CT imaging of the abdomen and pelvis was performed using the standard protocol following bolus  administration of intravenous contrast. CONTRAST:  <See Chart> ISOVUE-300 IOPAMIDOL (ISOVUE-300) INJECTION 61% COMPARISON:  None. FINDINGS: Lower chest: No acute abnormality. Hepatobiliary: Liver normal in size and attenuation. No mass or focal lesions. There is mild intra and extrahepatic  bile duct dilation. Common bile duct measures 1 cm proximally, but shows normal distal tapering. No evidence of a duct stone. Gallbladder is moderately distended. There are gallstones. No wall thickening. No adjacent inflammation. Pancreas: Unremarkable. No pancreatic ductal dilatation or surrounding inflammatory changes. Spleen: Normal in size without focal abnormality. Adrenals/Urinary Tract: No adrenal masses. Multiple bilateral low-density nonenhancing renal masses, largest arising from the anteromedial mid to lower pole of the left kidney measuring 5.1 cm. These are all consistent with cysts. No renal stones. No hydronephrosis. Ureters are normal course and in caliber. Bladder is unremarkable. Stomach/Bowel: Stomach is unremarkable. Small bowel is normal caliber with no wall thickening or inflammation. There are numerous colonic diverticula. No evidence of diverticulitis or other colon inflammatory process. Appendix is normal. Vascular/Lymphatic: There are several prominent to mildly enlarged gastrohepatic ligament lymph nodes, largest measuring 11 mm in short axis. No other adenopathy. Atherosclerotic disease noted along the abdominal aorta and iliac vessels. No aneurysm. Reproductive: Prostate is mildly enlarged measuring 4.8 cm maximum transverse dimension. Other: No abdominal wall hernia or abnormality. No abdominopelvic ascites. Musculoskeletal: No fracture or acute finding. No osteoblastic or osteolytic lesions. There are significant degenerative changes of the mid to lower lumbar spine. IMPRESSION: 1. No acute findings within the abdomen or pelvis. 2. Extensive colonic diverticulosis with no evidence of diverticulitis.  3. Mild intra and extrahepatic bile duct dilation. No evidence bile duct obstruction. This is most likely chronic. 4. Gallstones.  No acute cholecystitis. 5. Multiple renal cysts. 6. Aortic atherosclerosis. Electronically Signed   By: Lajean Manes M.D.   On: 09/28/2017 17:29   Dg Chest Portable 1 View  Result Date: 09/28/2017 CLINICAL DATA:  Chest pain, shortness of breath and chest tightness. EXAM: PORTABLE CHEST 1 VIEW COMPARISON:  03/12/2017 FINDINGS: Cardiomediastinal silhouette is normal. Mediastinal contours appear intact. Tortuosity of the aorta. There is no evidence of focal airspace consolidation, pleural effusion or pneumothorax. Osseous structures are without acute abnormality. Soft tissues are grossly normal. IMPRESSION: No active disease. Electronically Signed   By: Fidela Salisbury M.D.   On: 09/28/2017 15:39   US Abdomen Limited Ruq  Result Date: 09/28/2017 CLINICAL DATA:  Right upper quadrant abdominal pain beginning this morning. EXAM: ULTRASOUND ABDOMEN LIMITED RIGHT UPPER QUADRANT COMPARISON:  Current abdomen pelvis CT FINDINGS: Gallbladder: Gallstones, largest measuring just under 3 cm. Stones cause posterior acoustic shadowing somewhat limiting evaluation of the gallbladder. No gallbladder wall thickening or pericholecystic fluid. There is some sludge. Common bile duct: Diameter: 10 mm.  No sonographic evidence of a duct stone. Liver: Normal size and parenchymal echogenicity. No mass or focal lesion. Mild central intrahepatic bile duct dilation. Portal vein is patent on color Doppler imaging with normal direction of blood flow towards the liver. IMPRESSION: 1. Gallstones without evidence of acute cholecystitis. 2. Mild intra and extrahepatic bile duct dilation as noted on the current CT. No sonographic evidence of a duct stone. Electronically Signed   By: Lajean Manes M.D.   On: 09/28/2017 19:04    Pending Labs Unresulted Labs (From admission, onward)   Start     Ordered    09/29/17 0500  CBC  Tomorrow morning,   R     09/28/17 2021   09/29/17 0500  Comprehensive metabolic panel  Tomorrow morning,   R     09/28/17 2021   09/28/17 2226  Troponin I  Once,   R    Comments:  For chest pain complaints    09/28/17 2225   09/28/17  2207  Urinalysis, Routine w reflex microscopic  STAT,   R     09/28/17 2206   09/28/17 2022  Hepatitis panel, acute  Add-on,   R     09/28/17 2021      Vitals/Pain Today's Vitals   09/28/17 1830 09/28/17 1930 09/28/17 2100 09/28/17 2125  BP: (!) 144/80 108/62 (!) 112/58 (!) 112/58  Pulse: 75   92  Resp: '17 14 17 14  ' Temp:      TempSrc:      SpO2: 94% 95% 95% 98%  Weight:      Height:      PainSc:        Isolation Precautions No active isolations  Medications Medications  oxyCODONE (Oxy IR/ROXICODONE) immediate release tablet 10 mg (has no administration in time range)  enoxaparin (LOVENOX) injection 40 mg (has no administration in time range)  0.9 %  sodium chloride infusion (has no administration in time range)  ondansetron (ZOFRAN) tablet 4 mg (has no administration in time range)    Or  ondansetron (ZOFRAN) injection 4 mg (has no administration in time range)  acetaminophen (TYLENOL) suppository 650 mg (has no administration in time range)  albuterol (PROVENTIL) (2.5 MG/3ML) 0.083% nebulizer solution 2.5 mg (has no administration in time range)  famotidine (PEPCID) IVPB 20 mg premix (has no administration in time range)  gi cocktail (Maalox,Lidocaine,Donnatal) (has no administration in time range)  nicotine (NICODERM CQ - dosed in mg/24 hours) patch 21 mg (has no administration in time range)  iopamidol (ISOVUE-300) 61 % injection 100 mL ( Intravenous Canceled Entry 09/28/17 1702)  fentaNYL (SUBLIMAZE) injection 25 mcg (25 mcg Intravenous Given 09/28/17 1754)  gadobenate dimeglumine (MULTIHANCE) injection 20 mL (20 mLs Intravenous Contrast Given 09/28/17 2232)    Mobility Walks

## 2017-09-28 NOTE — ED Provider Notes (Signed)
[facility] DEPT Provider Note   CSN: [id number] Arrival date & time: 09/28/17  1501     History   Chief Complaint Chief Complaint  Patient presents with  . Pleurisy    HPI Adrian Miller is a 77 y.o. male.  HPI   Patient is a 77 year old male presenting with chest pain.  Patient reports is a chest tightness.  Not associated with eating.  Patient does have history of ulcer disease and acute esophagitis.  He says this feels different than that.  He reports about 4 AM he woke up and that pain is been continued since then.  Patient took his chronic pain medication at home and did not get better.  On arrival patient had a 10 out of 10 pain, however it is since feels better after lying still.  Patient has had last echo over 5 years ago, last stress test over 5 years ago.  Patient sees primary care physician for hypertension hyperlipidemia, and prediabetes.  He endorses chronic hip and back pain for which he uses opiates.  Past Medical History:  Diagnosis Date  . Acute esophagitis 06/01/2015  . Benign essential HTN 05/31/2015  . Duodenal ulcer hemorrhage   . Esophageal stricture 06/01/2015  . Hiatal hernia 06/01/2015    Patient Active Problem List   Diagnosis Date Noted  . Encounter for long-term use of opiate analgesic 08/09/2017  . Bradycardia 11/26/2016  . Left lumbar radiculopathy 01/18/2016  . Calculus of gallbladder without cholecystitis without obstruction 01/16/2016  . Left carpal tunnel syndrome 12/28/2015  . Hyperlipidemia with target LDL less than 130 12/26/2015  . GERD (gastroesophageal reflux disease) 12/19/2015  . Tobacco abuse counseling 12/19/2015  . Esophageal reflux   . Arthritis of right hip 06/15/2015  . Rotator cuff tear arthropathy of right shoulder 06/15/2015  . Prediabetes 06/14/2015  . Generalized OA 06/14/2015  . B12 deficiency anemia 06/14/2015  . Other iron deficiency anemias 06/14/2015  . Hiatal hernia 06/01/2015  .  Esophageal stricture 06/01/2015  . Benign essential HTN 05/31/2015  . Duodenal ulcer hemorrhage     Past Surgical History:  Procedure Laterality Date  . ESOPHAGOGASTRODUODENOSCOPY N/A 05/31/2015   Procedure: ESOPHAGOGASTRODUODENOSCOPY (EGD);  Surgeon: Ladene Artist, MD;  Location: [facility];  Service: Endoscopy;  Laterality: N/A;  . HERNIA REPAIR  1975  . LEFT ROTATOR CUFF REPAIR  2002  . REPLACEMENT TOTAL KNEE Left   . TOTAL KNEE ARTHROPLASTY Right 02/22/2015   Procedure: TOTAL KNEE ARTHROPLASTY;  Surgeon: Melrose Nakayama, MD;  Location: [facility];  Service: Orthopedics;  Laterality: Right;        Home Medications    Prior to Admission medications   Medication Sig Start Date End Date Taking? Authorizing Provider  chlorthalidone (HYGROTON) 25 MG tablet TAKE 1 TABLET BY MOUTH EVERY DAY 07/05/17  Yes Janith Lima, MD  irbesartan (AVAPRO) 300 MG tablet Take 1 tablet (300 mg total) by mouth daily. 08/27/17  Yes Janith Lima, MD  oxyCODONE-acetaminophen (PERCOCET) 10-325 MG tablet Take 1 tablet by mouth every 8 (eight) hours as needed for pain. 08/28/17  Yes Janith Lima, MD  atorvastatin (LIPITOR) 20 MG tablet Take 1 tablet (20 mg total) by mouth daily. Patient not taking: Reported on 09/28/2017 12/27/15   Janith Lima, MD  cyanocobalamin 2000 MCG tablet Take 1 tablet (2,000 mcg total) by mouth daily. Patient not taking: Reported on 09/28/2017 06/14/15   Janith Lima, MD  ferrous sulfate 325 (65 FE) MG tablet  Take 1 tablet (325 mg total) by mouth 3 (three) times daily with meals. Patient not taking: Reported on 09/28/2017 06/14/15   Janith Lima, MD  furosemide (LASIX) 20 MG tablet Take 1 tablet (20 mg total) by mouth daily as needed for edema. Patient not taking: Reported on 09/28/2017 12/28/15   Lyndal Pulley, DO  gabapentin (NEURONTIN) 100 MG capsule Take 2 capsules (200 mg total) by mouth at bedtime. Patient not taking: Reported on 09/28/2017 01/18/16   Lyndal Pulley, DO    pantoprazole (PROTONIX) 40 MG tablet Take 1 tablet (40 mg total) by mouth daily. Patient not taking: Reported on 09/28/2017 12/19/15   Louellen Molder, MD    Family History Family History  Problem Relation Age of Onset  . Hypertension Mother   . Heart attack Sister     Social History Social History   Tobacco Use  . Smoking status: Current Every Day Smoker    Packs/day: 0.50    Years: 51.00    Pack years: 25.50    Types: Cigarettes  . Smokeless tobacco: Former Systems developer    Quit date: 11/03/2014  . Tobacco comment: quit 2 weeks ago  Substance Use Topics  . Alcohol use: No    Alcohol/week: 0.0 oz  . Drug use: No     Allergies   Asa [aspirin]   Review of Systems Review of Systems  Constitutional: Negative for activity change and fatigue.  HENT: Negative for congestion.   Respiratory: Positive for chest tightness. Negative for shortness of breath.   Cardiovascular: Positive for chest pain.  Gastrointestinal: Negative for abdominal pain, nausea and vomiting.  Musculoskeletal: Positive for back pain.  All other systems reviewed and are negative.    Physical Exam Updated Vital Signs BP 108/62   Pulse 75   Temp 99.2 F (37.3 C) (Oral)   Resp 14   Ht 5\' 9"  (1.753 m)   Wt 93 kg (205 lb)   SpO2 95%   BMI 30.27 kg/m   Physical Exam  Constitutional: He is oriented to person, place, and time. He appears well-nourished.  HENT:  Head: Normocephalic.  Eyes: Conjunctivae are normal.  Cardiovascular: Normal rate and regular rhythm.  No murmur heard. Pulmonary/Chest: Effort normal and breath sounds normal. No respiratory distress.  Abdominal: Soft. He exhibits no distension. There is tenderness.  Diffuse tenderness, no focality.  Neurological: He is oriented to person, place, and time.  Skin: Skin is warm and dry. He is not diaphoretic.  Psychiatric: He has a normal mood and affect. His behavior is normal.     ED Treatments / Results  Labs (all labs ordered are  listed, but only abnormal results are displayed) Labs Reviewed  BASIC METABOLIC PANEL - Abnormal; Notable for the following components:      Result Value   Chloride 96 (*)    Glucose, Bld 120 (*)    BUN 27 (*)    Creatinine, Ser 1.34 (*)    GFR calc non Af Amer 50 (*)    GFR calc Af Amer 58 (*)    All other components within normal limits  CBC - Abnormal; Notable for the following components:   WBC 12.5 (*)    All other components within normal limits  HEPATIC FUNCTION PANEL - Abnormal; Notable for the following components:   AST 524 (*)    ALT 365 (*)    Alkaline Phosphatase 150 (*)    Total Bilirubin 2.4 (*)    Bilirubin, Direct 0.7 (*)  Indirect Bilirubin 1.7 (*)    All other components within normal limits  LIPASE, BLOOD  I-STAT TROPONIN, ED    EKG EKG Interpretation  Date/Time:  Saturday Sep 28 2017 15:09:26 EDT Ventricular Rate:  59 PR Interval:    QRS Duration: 93 QT Interval:  422 QTC Calculation: 418 R Axis:   63 Text Interpretation:  Sinus rhythm Ventricular premature complex Since last tracing rate slower Confirmed by Thomasene Lot, [place] [phone]) on 09/28/2017 4:14:04 PM Also confirmed by Thomasene Lot, [place] [place] [phone]), editor Philomena Doheny [phone])  on 09/28/2017 4:41:39 PM   Radiology Ct Abdomen Pelvis W Contrast  Result Date: 09/28/2017 CLINICAL DATA:  Patient does have history of ulcer disease and acute esophagitis. He says this feels different than that. He reports about 4 AM he woke up and that pain is been continued since then. Patient took his chronic pain medication at home and did not get better. On arrival patient had a 10 out of 10 pain, however it is since feels better after lying still EXAM: CT ABDOMEN AND PELVIS WITH CONTRAST TECHNIQUE: Multidetector CT imaging of the abdomen and pelvis was performed using the standard protocol following bolus administration of intravenous contrast. CONTRAST:  <See Chart> ISOVUE-300 IOPAMIDOL (ISOVUE-300) INJECTION 61%  COMPARISON:  None. FINDINGS: Lower chest: No acute abnormality. Hepatobiliary: Liver normal in size and attenuation. No mass or focal lesions. There is mild intra and extrahepatic bile duct dilation. Common bile duct measures 1 cm proximally, but shows normal distal tapering. No evidence of a duct stone. Gallbladder is moderately distended. There are gallstones. No wall thickening. No adjacent inflammation. Pancreas: Unremarkable. No pancreatic ductal dilatation or surrounding inflammatory changes. Spleen: Normal in size without focal abnormality. Adrenals/Urinary Tract: No adrenal masses. Multiple bilateral low-density nonenhancing renal masses, largest arising from the anteromedial mid to lower pole of the left kidney measuring 5.1 cm. These are all consistent with cysts. No renal stones. No hydronephrosis. Ureters are normal course and in caliber. Bladder is unremarkable. Stomach/Bowel: Stomach is unremarkable. Small bowel is normal caliber with no wall thickening or inflammation. There are numerous colonic diverticula. No evidence of diverticulitis or other colon inflammatory process. Appendix is normal. Vascular/Lymphatic: There are several prominent to mildly enlarged gastrohepatic ligament lymph nodes, largest measuring 11 mm in short axis. No other adenopathy. Atherosclerotic disease noted along the abdominal aorta and iliac vessels. No aneurysm. Reproductive: Prostate is mildly enlarged measuring 4.8 cm maximum transverse dimension. Other: No abdominal wall hernia or abnormality. No abdominopelvic ascites. Musculoskeletal: No fracture or acute finding. No osteoblastic or osteolytic lesions. There are significant degenerative changes of the mid to lower lumbar spine. IMPRESSION: 1. No acute findings within the abdomen or pelvis. 2. Extensive colonic diverticulosis with no evidence of diverticulitis. 3. Mild intra and extrahepatic bile duct dilation. No evidence bile duct obstruction. This is most likely  chronic. 4. Gallstones.  No acute cholecystitis. 5. Multiple renal cysts. 6. Aortic atherosclerosis. Electronically Signed   By: Lajean Manes M.D.   On: 09/28/2017 17:29   Dg Chest Portable 1 View  Result Date: 09/28/2017 CLINICAL DATA:  Chest pain, shortness of breath and chest tightness. EXAM: PORTABLE CHEST 1 VIEW COMPARISON:  03/12/2017 FINDINGS: Cardiomediastinal silhouette is normal. Mediastinal contours appear intact. Tortuosity of the aorta. There is no evidence of focal airspace consolidation, pleural effusion or pneumothorax. Osseous structures are without acute abnormality. Soft tissues are grossly normal. IMPRESSION: No active disease. Electronically Signed   By: Fidela Salisbury M.D.   On: 09/28/2017 15:39  US Abdomen Limited Ruq  Result Date: 09/28/2017 CLINICAL DATA:  Right upper quadrant abdominal pain beginning this morning. EXAM: ULTRASOUND ABDOMEN LIMITED RIGHT UPPER QUADRANT COMPARISON:  Current abdomen pelvis CT FINDINGS: Gallbladder: Gallstones, largest measuring just under 3 cm. Stones cause posterior acoustic shadowing somewhat limiting evaluation of the gallbladder. No gallbladder wall thickening or pericholecystic fluid. There is some sludge. Common bile duct: Diameter: 10 mm.  No sonographic evidence of a duct stone. Liver: Normal size and parenchymal echogenicity. No mass or focal lesion. Mild central intrahepatic bile duct dilation. Portal vein is patent on color Doppler imaging with normal direction of blood flow towards the liver. IMPRESSION: 1. Gallstones without evidence of acute cholecystitis. 2. Mild intra and extrahepatic bile duct dilation as noted on the current CT. No sonographic evidence of a duct stone. Electronically Signed   By: Lajean Manes M.D.   On: 09/28/2017 19:04    Procedures Procedures (including critical care time)  Medications Ordered in ED Medications  iopamidol (ISOVUE-300) 61 % injection 100 mL ( Intravenous Canceled Entry 09/28/17 1702)    fentaNYL (SUBLIMAZE) injection 25 mcg (25 mcg Intravenous Given 09/28/17 1754)     Initial Impression / Assessment and Plan / ED Course  I have reviewed the triage vital signs and the nursing notes.  Pertinent labs & imaging results that were available during my care of the patient were reviewed by me and considered in my medical decision making (see chart for details).     Patient is a 77 year old male presenting with chest pain.  Patient reports is a chest tightness.  Not associated with eating.  Patient does have history of ulcer disease and acute esophagitis.  He says this feels different than that.  He reports about 4 AM he woke up and that pain is been continued since then.  Patient took his chronic pain medication at home and did not get better.  On arrival patient had a 10 out of 10 pain, however it is since feels better after lying still.  Patient has had last echo over 5 years ago, last stress test over 5 years ago.  Patient sees primary care physician for hypertension hyperlipidemia, and prediabetes.  He endorses chronic hip and back pain for which he uses opiates.  7:43 PM Given patient's age, tenderness on exam, we have ulcer disease, will get CT.  Otherwise given patient's elevated heart score, cardiac risk and lack of recent risk stratification, will need to dmit for serial troponins.  7:43 PM Patient liver enzymes elevated with elevated T bili.  Patient ultrasound shows cholelithiasis without evidence of cholecystitis.  Mild bile duct dilatation.  Discussed with the [facility], will admit to medicine with plan for MRCP tomorrow.  Final Clinical Impressions(s) / ED Diagnoses   Final diagnoses:  RUQ abdominal pain    ED Discharge Orders    None       Macarthur Critchley, MD 09/28/17 1943

## 2017-09-28 NOTE — ED Notes (Signed)
Bed: WA15 Expected date:  Expected time:  Means of arrival:  Comments: Hold for RES B 

## 2017-09-28 NOTE — ED Notes (Signed)
Patient transported to CT 

## 2017-09-28 NOTE — ED Triage Notes (Signed)
He presents to triage with c/o chest pain. His skin is normal, warm and dry and he is breathing normally. He is utterly (according to him) unable to tolerate sitting up in a chair and insists that "if you can't get me to a place to lay down I'm gonna lay down on the floor here" [sic]. We take him to resus. B, where EKG will be performed.

## 2017-09-28 NOTE — H&P (Signed)
History and Physical    Adrian Miller WER:[id number] DOB: Jan 12, 1941 DOA: 09/28/2017  Referring MD/NP/PA:  Dr. Thomasene Lot PCP: Janith Lima, MD  Patient coming from: Home  Chief Complaint: Chest pain  I have personally briefly reviewed patient's old medical records in [place]   HPI: Adrian Miller is a 77 y.o. male with medical history significant of HTN, HLD, diastolic dysfunction last EF 60 - 65% with grade 2 diastolic dysfunction, duodenal ulcer, and esophagitis; who presents with complaints of substernal chest pain started around 4 AM this morning.  Patient reports symptoms worsened with inspiratory breathing.  Rates pain at its worse as a 10 out of 10 on the pain scale.  He tried taking home pain medications of Percocet without relief.  Patient has previous history of esophagitis and duodenal ulcer not currently on any antiacid medication.  He however feels that the symptoms he is currently having are different.   he reports chronically having abdominal pain that he relates to bilateral hernia repair with mesh over 35 years ago.  Pain is worse on the right side as opposed to left.  He complains of associated symptoms of urinary frequency.  Patient does not drink any alcohol.  He admits to smoking a half a pack of cigarettes per day on average.   ED Course: Admission into the emergency department patient was noted to be afebrile, pulse 59-87, respiration 14-26, blood pressure 108/62-186/87, O2 saturation maintained on room air.  Labs revealed WBC 12.5, BUN 27, creatinine 1.34, lipase 40, alkaline phosphatase 150, AST 524, ALT 365, indirect bilirubin 1.7, direct bilirubin 0.7.  Chest x-ray showed no acute abnormalities.  CT scan of the abdomen showed mild intrahepatic dilatation and cholelithiasis without signs of cholecystitis confirmed on ultrasound.  [facility] was consulted and recommended admission and to check a MRCP.  Patient received 25 mcg of fentanyl and TRH called to  admit.   Review of Systems  Constitutional: Positive for malaise/fatigue. Negative for chills and fever.  HENT: Negative for congestion and nosebleeds.   Eyes: Negative for photophobia and pain.  Respiratory: Positive for shortness of breath. Negative for cough.   Cardiovascular: Positive for chest pain. Negative for leg swelling.  Gastrointestinal: Positive for abdominal pain and heartburn.  Genitourinary: Positive for frequency.  Musculoskeletal: Negative for falls and myalgias.  Neurological: Negative for focal weakness and loss of consciousness.  Psychiatric/Behavioral: Negative for memory loss and substance abuse.    Past Medical History:  Diagnosis Date  . Acute esophagitis 06/01/2015  . Benign essential HTN 05/31/2015  . Duodenal ulcer hemorrhage   . Esophageal stricture 06/01/2015  . Hiatal hernia 06/01/2015    Past Surgical History:  Procedure Laterality Date  . ESOPHAGOGASTRODUODENOSCOPY N/A 05/31/2015   Procedure: ESOPHAGOGASTRODUODENOSCOPY (EGD);  Surgeon: Ladene Artist, MD;  Location: [facility];  Service: Endoscopy;  Laterality: N/A;  . HERNIA REPAIR  1975  . LEFT ROTATOR CUFF REPAIR  2002  . REPLACEMENT TOTAL KNEE Left   . TOTAL KNEE ARTHROPLASTY Right 02/22/2015   Procedure: TOTAL KNEE ARTHROPLASTY;  Surgeon: Melrose Nakayama, MD;  Location: [facility];  Service: Orthopedics;  Laterality: Right;     reports that he has been smoking cigarettes.  He has a 25.50 pack-year smoking history. He quit smokeless tobacco use about 2 years ago. He reports that he does not drink alcohol or use drugs.  Allergies  Allergen Reactions  . Asa [Aspirin]     angioedema    Family History  Problem  Relation Age of Onset  . Hypertension Mother   . Heart attack Sister     Prior to Admission medications   Medication Sig Start Date End Date Taking? Authorizing Provider  chlorthalidone (HYGROTON) 25 MG tablet TAKE 1 TABLET BY MOUTH EVERY DAY 07/05/17  Yes Janith Lima, MD   irbesartan (AVAPRO) 300 MG tablet Take 1 tablet (300 mg total) by mouth daily. 08/27/17  Yes Janith Lima, MD  oxyCODONE-acetaminophen (PERCOCET) 10-325 MG tablet Take 1 tablet by mouth every 8 (eight) hours as needed for pain. 08/28/17  Yes Janith Lima, MD  atorvastatin (LIPITOR) 20 MG tablet Take 1 tablet (20 mg total) by mouth daily. Patient not taking: Reported on 09/28/2017 12/27/15   Janith Lima, MD  cyanocobalamin 2000 MCG tablet Take 1 tablet (2,000 mcg total) by mouth daily. Patient not taking: Reported on 09/28/2017 06/14/15   Janith Lima, MD  ferrous sulfate 325 (65 FE) MG tablet Take 1 tablet (325 mg total) by mouth 3 (three) times daily with meals. Patient not taking: Reported on 09/28/2017 06/14/15   Janith Lima, MD  furosemide (LASIX) 20 MG tablet Take 1 tablet (20 mg total) by mouth daily as needed for edema. Patient not taking: Reported on 09/28/2017 12/28/15   Lyndal Pulley, DO  gabapentin (NEURONTIN) 100 MG capsule Take 2 capsules (200 mg total) by mouth at bedtime. Patient not taking: Reported on 09/28/2017 01/18/16   Lyndal Pulley, DO  pantoprazole (PROTONIX) 40 MG tablet Take 1 tablet (40 mg total) by mouth daily. Patient not taking: Reported on 09/28/2017 12/19/15   Louellen Molder, MD    Physical Exam:  Constitutional: Elderly male who appears to be in discomfort Vitals:   09/28/17 1730 09/28/17 1800 09/28/17 1830 09/28/17 1930  BP: (!) 158/79 (!) 145/94 (!) 144/80 108/62  Pulse: 68 81 75   Resp: _0 Temp:      TempSrc:      SpO2: 97% 94% 94% 95%  Weight:      Height:       Eyes: PERRL, lids and conjunctivae normal ENMT: Mucous membranes are moist. Posterior pharynx clear of any exudate or lesions.   Neck: normal, supple, no masses, no thyromegaly Respiratory: clear to auscultation bilaterally, no wheezing, no crackles. Normal respiratory effort. No accessory muscle use.  Cardiovascular: Regular rate and rhythm, no murmurs / rubs /  gallops. No extremity edema. 2+ pedal pulses. No carotid bruits.  Abdomen: Tenderness to palpation of the right upper quadrant bowel sounds present Musculoskeletal: no clubbing / cyanosis. No joint deformity upper and lower extremities. Good ROM, no contractures. Normal muscle tone.  Skin: no rashes, lesions, ulcers. No induration Neurologic: CN 2-12 grossly intact. Sensation intact, DTR normal. Strength 5/5 in all 4.  Psychiatric: Normal judgment and insight. Alert and oriented x 3. Normal mood.     Labs on Admission: I have personally reviewed following labs and imaging studies  CBC: Recent Labs  Lab 09/28/17 1506  WBC 12.5*  HGB 14.4  HCT 41.3  MCV 90.8  PLT 147   Basic Metabolic Panel: Recent Labs  Lab 09/28/17 1506  NA 136  K 4.1  CL 96*  CO2 27  GLUCOSE 120*  BUN 27*  CREATININE 1.34*  CALCIUM 9.9   GFR: Estimated Creatinine Clearance: 52.8 mL/min (A) (by C-G formula based on SCr of 1.34 mg/dL (H)). Liver Function Tests: Recent Labs  Lab 09/28/17 1654  AST 524*  ALT 365*  ALKPHOS 150*  BILITOT 2.4*  PROT 8.0  ALBUMIN 4.3   Recent Labs  Lab 09/28/17 1654  LIPASE 40   No results for input(s): AMMONIA in the last 168 hours. Coagulation Profile: No results for input(s): INR, PROTIME in the last 168 hours. Cardiac Enzymes: No results for input(s): CKTOTAL, CKMB, CKMBINDEX, TROPONINI in the last 168 hours. BNP (last 3 results) No results for input(s): PROBNP in the last 8760 hours. HbA1C: No results for input(s): HGBA1C in the last 72 hours. CBG: No results for input(s): GLUCAP in the last 168 hours. Lipid Profile: No results for input(s): CHOL, HDL, LDLCALC, TRIG, CHOLHDL, LDLDIRECT in the last 72 hours. Thyroid Function Tests: No results for input(s): TSH, T4TOTAL, FREET4, T3FREE, THYROIDAB in the last 72 hours. Anemia Panel: No results for input(s): VITAMINB12, FOLATE, FERRITIN, TIBC, IRON, RETICCTPCT in the last 72 hours. Urine analysis:     Component Value Date/Time   COLORURINE YELLOW 05/30/2015 [place] 05/30/2015 1614   LABSPEC 1.026 05/30/2015 1614   PHURINE 5.0 05/30/2015 1614   GLUCOSEU NEGATIVE 05/30/2015 1614   GLUCOSEU NEGATIVE 03/18/2014 1621   HGBUR NEGATIVE 05/30/2015 1614   BILIRUBINUR NEGATIVE 05/30/2015 1614   BILIRUBINUR small 02/05/2012 1931   KETONESUR NEGATIVE 05/30/2015 1614   PROTEINUR NEGATIVE 05/30/2015 1614   UROBILINOGEN 0.2 02/11/2015 0929   NITRITE NEGATIVE 05/30/2015 1614   LEUKOCYTESUR NEGATIVE 05/30/2015 1614   Sepsis Labs: No results found for this or any previous visit (from the past 240 hour(s)).   Radiological Exams on Admission: Ct Abdomen Pelvis W Contrast  Result Date: 09/28/2017 CLINICAL DATA:  Patient does have history of ulcer disease and acute esophagitis. He says this feels different than that. He reports about 4 AM he woke up and that pain is been continued since then. Patient took his chronic pain medication at home and did not get better. On arrival patient had a 10 out of 10 pain, however it is since feels better after lying still EXAM: CT ABDOMEN AND PELVIS WITH CONTRAST TECHNIQUE: Multidetector CT imaging of the abdomen and pelvis was performed using the standard protocol following bolus administration of intravenous contrast. CONTRAST:  <See Chart> ISOVUE-300 IOPAMIDOL (ISOVUE-300) INJECTION 61% COMPARISON:  None. FINDINGS: Lower chest: No acute abnormality. Hepatobiliary: Liver normal in size and attenuation. No mass or focal lesions. There is mild intra and extrahepatic bile duct dilation. Common bile duct measures 1 cm proximally, but shows normal distal tapering. No evidence of a duct stone. Gallbladder is moderately distended. There are gallstones. No wall thickening. No adjacent inflammation. Pancreas: Unremarkable. No pancreatic ductal dilatation or surrounding inflammatory changes. Spleen: Normal in size without focal abnormality. Adrenals/Urinary Tract:  No adrenal masses. Multiple bilateral low-density nonenhancing renal masses, largest arising from the anteromedial mid to lower pole of the left kidney measuring 5.1 cm. These are all consistent with cysts. No renal stones. No hydronephrosis. Ureters are normal course and in caliber. Bladder is unremarkable. Stomach/Bowel: Stomach is unremarkable. Small bowel is normal caliber with no wall thickening or inflammation. There are numerous colonic diverticula. No evidence of diverticulitis or other colon inflammatory process. Appendix is normal. Vascular/Lymphatic: There are several prominent to mildly enlarged gastrohepatic ligament lymph nodes, largest measuring 11 mm in short axis. No other adenopathy. Atherosclerotic disease noted along the abdominal aorta and iliac vessels. No aneurysm. Reproductive: Prostate is mildly enlarged measuring 4.8 cm maximum transverse dimension. Other: No abdominal wall hernia or abnormality. No abdominopelvic ascites. Musculoskeletal: No fracture or acute finding.  No osteoblastic or osteolytic lesions. There are significant degenerative changes of the mid to lower lumbar spine. IMPRESSION: 1. No acute findings within the abdomen or pelvis. 2. Extensive colonic diverticulosis with no evidence of diverticulitis. 3. Mild intra and extrahepatic bile duct dilation. No evidence bile duct obstruction. This is most likely chronic. 4. Gallstones.  No acute cholecystitis. 5. Multiple renal cysts. 6. Aortic atherosclerosis. Electronically Signed   By: Lajean Manes M.D.   On: 09/28/2017 17:29   Dg Chest Portable 1 View  Result Date: 09/28/2017 CLINICAL DATA:  Chest pain, shortness of breath and chest tightness. EXAM: PORTABLE CHEST 1 VIEW COMPARISON:  03/12/2017 FINDINGS: Cardiomediastinal silhouette is normal. Mediastinal contours appear intact. Tortuosity of the aorta. There is no evidence of focal airspace consolidation, pleural effusion or pneumothorax. Osseous structures are without  acute abnormality. Soft tissues are grossly normal. IMPRESSION: No active disease. Electronically Signed   By: Fidela Salisbury M.D.   On: 09/28/2017 15:39   US Abdomen Limited Ruq  Result Date: 09/28/2017 CLINICAL DATA:  Right upper quadrant abdominal pain beginning this morning. EXAM: ULTRASOUND ABDOMEN LIMITED RIGHT UPPER QUADRANT COMPARISON:  Current abdomen pelvis CT FINDINGS: Gallbladder: Gallstones, largest measuring just under 3 cm. Stones cause posterior acoustic shadowing somewhat limiting evaluation of the gallbladder. No gallbladder wall thickening or pericholecystic fluid. There is some sludge. Common bile duct: Diameter: 10 mm.  No sonographic evidence of a duct stone. Liver: Normal size and parenchymal echogenicity. No mass or focal lesion. Mild central intrahepatic bile duct dilation. Portal vein is patent on color Doppler imaging with normal direction of blood flow towards the liver. IMPRESSION: 1. Gallstones without evidence of acute cholecystitis. 2. Mild intra and extrahepatic bile duct dilation as noted on the current CT. No sonographic evidence of a duct stone. Electronically Signed   By: Lajean Manes M.D.   On: 09/28/2017 19:04    EKG: Independently reviewed.  Sinus bradycardia at 59 bpm with PVC  Assessment/Plan Abnormal liver function test, Abdominal pain, cholelithiasis without cystitis: Acute.  Patient presents with abdominal pain complaints.  Labs revealed normal lipase, AST 524, ALT 365, alkaline phosphatase 150, indirect bilirubin 1.7, and direct bilirubin 0.7.  CT abdomen pelvis imaging showing intra-and extrahepatic ductal dilatation and cholelithiasis without cholecystitis confirmed with ultrasound.  Patient does not report drinking alcohol, and previously had negative hepatitis screen back in 2017.  [facility] consulted and recommending MRCP. - Admit to a telemetry bed - N.p.o. except meds - Check hepatitis panel, esr, and acetaminophen level -  Normal saline IV fluids at 129m /hr as tolerated without bolus due to diastolic dysfunction history - Check MRCP per GI recommendations - Avoid hepatotoxic agents - Check repeat CMP in a.m. - Appreciate GI consultative services, will follow-up for further recommendations  GERD with esophagitis vs. Chest pain: Patient reports having substernal chest discomfort which he notes that it is hard for him to take a deep inspiratory breath.  Laying down reportedly makes symptoms better.  Chest x-ray showed a tortuous aorta but otherwise is clear of any signs of fluid or infiltrate.  The initial troponin negative and EKG without significant EKG changes.  Suspect symptoms could be likely related to patient's previous history of  esophagitis and/or esophageal stricture. - GI cocktail stat - Pepcid IV - Recheck troponin - Follow-up telemetry overnight  Leukocytosis: Acute.  WBC elevated 12.5.  Suspect secondary to underlying process above.  Chest x-ray showed no acute infiltrate. - Follow-up urinalysis - Recheck CBC in a.m.  Urinary frequency, BPH: Patient complains of urinary frequency symptoms.  Prostate was noted to be mildly enlarged 4.8 cm on CT imaging.  - Check urinalysis  - Patient may benefit from Flomax  Renal insufficiency: Acute.  On admission creatinine was noted to be Cr 1.34 and BUN 27.  Elevated BUN to creatinine ratio 20 suggesting prerenal cause of symptoms.  Baseline creatinine previously noted to be around 1.18.  - Monitor intake and output - Recheck kidney function in a.m.  Diastolic dysfunction: Patient's last echocardiogram in 05/2015 showed EF of 60 to 65% with grade 2 diastolic dysfunction.  Does not appear to be fluid overloaded at this time. - Monitoring intake and output - Daily weights  Essential hypertension - Restart chlorthalidone and ibesartan when medically appropriate  Chronic pain - Changed Percocet to oxycodone IR due to liver dysfunction prn pain   Tobacco  abuse: Patient reports smoking half pack cigarettes per day on average. - Nicotine patch - Counseled patient on need of cessation of tobacco  DVT prophylaxis: lovenox  Code Status:Full  Family Communication: Family present at bedside Disposition Plan: TBD Consults called: Gastroenterology Admission status: Inpatient  Norval Morton MD [facility] Pager [phone]   If 7PM-7AM, please contact night-coverage [email or web address] Password [facility]  09/28/2017, 7:48 PM

## 2017-09-28 NOTE — ED Notes (Signed)
Portable X-ray in room with patient.

## 2017-09-28 NOTE — ED Notes (Signed)
Notified ED provider of Epigastric pain and vomiting episode.

## 2017-09-29 DIAGNOSIS — K3189 Other diseases of stomach and duodenum: Secondary | ICD-10-CM

## 2017-09-29 DIAGNOSIS — N182 Chronic kidney disease, stage 2 (mild): Secondary | ICD-10-CM

## 2017-09-29 DIAGNOSIS — R109 Unspecified abdominal pain: Secondary | ICD-10-CM | POA: Diagnosis present

## 2017-09-29 DIAGNOSIS — R945 Abnormal results of liver function studies: Secondary | ICD-10-CM

## 2017-09-29 DIAGNOSIS — N289 Disorder of kidney and ureter, unspecified: Secondary | ICD-10-CM

## 2017-09-29 LAB — SURGICAL PCR SCREEN
MRSA, PCR: NEGATIVE
Staphylococcus aureus: NEGATIVE

## 2017-09-29 LAB — COMPREHENSIVE METABOLIC PANEL
ALT: 592 U/L — ABNORMAL HIGH (ref 17–63)
AST: 447 U/L — ABNORMAL HIGH (ref 15–41)
Albumin: 3.5 g/dL (ref 3.5–5.0)
Alkaline Phosphatase: 228 U/L — ABNORMAL HIGH (ref 38–126)
Anion gap: 10 (ref 5–15)
BUN: 22 mg/dL — ABNORMAL HIGH (ref 6–20)
CO2: 24 mmol/L (ref 22–32)
Calcium: 9.8 mg/dL (ref 8.9–10.3)
Chloride: 105 mmol/L (ref 101–111)
Creatinine, Ser: 1.04 mg/dL (ref 0.61–1.24)
GFR calc Af Amer: >60 mL/min (ref >60)
GFR calc non Af Amer: >60 mL/min (ref >60)
Glucose, Bld: 108 mg/dL — ABNORMAL HIGH (ref 65–99)
Potassium: 3.9 mmol/L (ref 3.5–5.1)
Sodium: 139 mmol/L (ref 135–145)
Total Bilirubin: 3.7 mg/dL — ABNORMAL HIGH (ref 0.3–1.2)
Total Protein: 6.9 g/dL (ref 6.5–8.1)

## 2017-09-29 LAB — CBC
HCT: 37.8 % — ABNORMAL LOW (ref 39.0–52.0)
Hemoglobin: 13 g/dL (ref 13.0–17.0)
MCH: 31.3 pg (ref 26.0–34.0)
MCHC: 34.4 g/dL (ref 30.0–36.0)
MCV: 90.9 fL (ref 78.0–100.0)
Platelets: 319 10*3/uL (ref 150–400)
RBC: 4.16 MIL/uL — ABNORMAL LOW (ref 4.22–5.81)
RDW: 13.8 % (ref 11.5–15.5)
WBC: 9.4 10*3/uL (ref 4.0–10.5)

## 2017-09-29 LAB — MRSA PCR SCREENING: MRSA by PCR: NEGATIVE

## 2017-09-29 LAB — SEDIMENTATION RATE: Sed Rate: 22 mm/hr — ABNORMAL HIGH (ref 0–16)

## 2017-09-29 MED ORDER — MUPIROCIN 2 % EX OINT
1.0000 "application " | TOPICAL_OINTMENT | Freq: Two times a day (BID) | CUTANEOUS | Status: AC
  Administered 2017-09-29: 1 via NASAL
  Filled 2017-09-29: qty 22

## 2017-09-29 MED ORDER — FAMOTIDINE IN NACL 20-0.9 MG/50ML-% IV SOLN
20.0000 mg | Freq: Two times a day (BID) | INTRAVENOUS | Status: DC
  Administered 2017-09-29: 20 mg via INTRAVENOUS
  Filled 2017-09-29: qty 50

## 2017-09-29 MED ORDER — GI COCKTAIL ~~LOC~~
30.0000 mL | Freq: Once | ORAL | Status: AC
  Administered 2017-09-29: 30 mL via ORAL
  Filled 2017-09-29: qty 30

## 2017-09-29 MED ORDER — CEFAZOLIN SODIUM-DEXTROSE 2-4 GM/100ML-% IV SOLN
2.0000 g | INTRAVENOUS | Status: DC
Start: 2017-09-30 — End: 2017-09-30

## 2017-09-29 MED ORDER — PANTOPRAZOLE SODIUM 40 MG PO TBEC
40.0000 mg | DELAYED_RELEASE_TABLET | Freq: Every day | ORAL | Status: DC
  Administered 2017-09-29 – 2017-10-05 (×7): 40 mg via ORAL
  Filled 2017-09-29 (×7): qty 1

## 2017-09-29 NOTE — Progress Notes (Signed)
PROGRESS NOTE    Adrian Miller  LOV:[id number] DOB: 1940-05-11 DOA: 09/28/2017 PCP: Janith Lima, MD   Brief Narrative: Patient is a 77 year old male with past medical history of hypertension, hyperlipidemia, diastolic dysfunction, and right upper quadrant pain.  Duodenal ulcer, esophagitis who presented to the emergency department with complaints of chest pain and right upper quadrant pain.  On presentation, his liver enzymes were found to be elevated.  CT imaging showed intrahepatic dilatation and cholelithiasis without signs of cholecystitis.  MRCP showed the same finding.  GI and general surgery following.  He symptoms are suggestive of biliary colic and he is planned for laparoscopic cholecystectomy tomorrow.  Assessment & Plan:   Principal Problem:   Abdominal pain Active Problems:   Leukocytosis   Benign essential HTN   GERD (gastroesophageal reflux disease)   Cholelithiasis   Elevated liver enzymes   Renal insufficiency   Right upper quadrant pain/elevated liver enzymes: CT imaging showed intra-and extrahepatic duct dilatation and cholelithiasis without cholecystitis.  MRCP showed same.  General surgery consulted.  His symptoms are suggestive of  biliary colic.  Plan for laparoscopic cholecystectomy tomorrow.  GI also following.  We will continue to monitor CMP.  Chest pain: Atypical chest pain.  His symptoms are consistent with GERD or esophagitis.  Continue PPI.  Troponins are negative and EKG did not show any ischemic changes.  Pain improved with GI cocktail.  Leukocytosis: Mild.  We will continue to monitor.  Chest x-ray did not show any acute infiltrate.    History of acute kidney injury: Improved with IV fluids.  Heart failure with preserved ejection fraction: Echocardiogram as per 1/17 showed ejection fraction of 60 to 65% with grade 2 diastolic dysfunction.  He is euvolemic at present.  History of essential hypertension: Currently blood pressure stable.  Continue to  monitor.  Home medications on hold.  Chronic pain syndrome: Continue his home medications.  Tobacco abuse: Continue nicotine patch.       DVT prophylaxis: Lovenox Code Status: Full Family Communication: No family present at the bedside Disposition Plan: Likely home after laparoscopic cholecystectomy   Consultants: GI, surgery  Procedures: None  Antimicrobials: None  Subjective: Patient seen and examined the pressure this morning.  Still complains of mild right upper quadrant pain.  He has right upper quadrant tenderness.  Denies any chest pain during my evaluation.  Hemodynamically stable. .  Objective: Vitals:   09/28/17 2125 09/28/17 2344 09/28/17 2357 09/29/17 0442  BP: (!) 112/58  121/72 102/60  Pulse: 92  61 74  Resp: 14  20 20   Temp:   98.9 F (37.2 C) 98.5 F (36.9 C)  TempSrc:      SpO2: 98%  97% 96%  Weight:  87 kg (191 lb 12.8 oz)  87.5 kg (192 lb 14.4 oz)  Height:  5\' 9"  (1.753 m)      Intake/Output Summary (Last 24 hours) at 09/29/2017 1339 Last data filed at 09/29/2017 0550 Gross per 24 hour  Intake 877.5 ml  Output -  Net 877.5 ml   Filed Weights   09/28/17 1519 09/28/17 2344 09/29/17 0442  Weight: 93 kg (205 lb) 87 kg (191 lb 12.8 oz) 87.5 kg (192 lb 14.4 oz)    Examination:  General exam: Appears calm and comfortable ,Not in distress,average built HEENT:PERRL,Oral mucosa moist, Ear/Nose normal on gross exam Respiratory system: Bilateral equal air entry, normal vesicular breath sounds, no wheezes or crackles  Cardiovascular system: S1 & S2 heard, RRR. No JVD,  murmurs, rubs, gallops or clicks. No pedal edema. Gastrointestinal system: Abdomen is nondistended, soft .mild right upper quadrant tenderness.  No organomegaly or masses felt. Normal bowel sounds heard. Central nervous system: Alert and oriented. No focal neurological deficits. Extremities: No edema, no clubbing ,no cyanosis, distal peripheral pulses palpable. Skin: No rashes, lesions or  ulcers,no icterus ,no pallor MSK: Normal muscle bulk,tone ,power Psychiatry: Judgement and insight appear normal. Mood & affect appropriate.     Data Reviewed: I have personally reviewed following labs and imaging studies  CBC: Recent Labs  Lab 09/28/17 1506 09/29/17 0530  WBC 12.5* 9.4  HGB 14.4 13.0  HCT 41.3 37.8*  MCV 90.8 90.9  PLT 340 151   Basic Metabolic Panel: Recent Labs  Lab 09/28/17 1506 09/29/17 0530  NA 136 139  K 4.1 3.9  CL 96* 105  CO2 27 24  GLUCOSE 120* 108*  BUN 27* 22*  CREATININE 1.34* 1.04  CALCIUM 9.9 9.8   GFR: Estimated Creatinine Clearance: 66.2 mL/min (by C-G formula based on SCr of 1.04 mg/dL). Liver Function Tests: Recent Labs  Lab 09/28/17 1654 09/29/17 0530  AST 524* 447*  ALT 365* 592*  ALKPHOS 150* 228*  BILITOT 2.4* 3.7*  PROT 8.0 6.9  ALBUMIN 4.3 3.5   Recent Labs  Lab 09/28/17 1654  LIPASE 40   No results for input(s): AMMONIA in the last 168 hours. Coagulation Profile: No results for input(s): INR, PROTIME in the last 168 hours. Cardiac Enzymes: No results for input(s): CKTOTAL, CKMB, CKMBINDEX, TROPONINI in the last 168 hours. BNP (last 3 results) No results for input(s): PROBNP in the last 8760 hours. HbA1C: No results for input(s): HGBA1C in the last 72 hours. CBG: No results for input(s): GLUCAP in the last 168 hours. Lipid Profile: No results for input(s): CHOL, HDL, LDLCALC, TRIG, CHOLHDL, LDLDIRECT in the last 72 hours. Thyroid Function Tests: No results for input(s): TSH, T4TOTAL, FREET4, T3FREE, THYROIDAB in the last 72 hours. Anemia Panel: No results for input(s): VITAMINB12, FOLATE, FERRITIN, TIBC, IRON, RETICCTPCT in the last 72 hours. Sepsis Labs: No results for input(s): PROCALCITON, LATICACIDVEN in the last 168 hours.  Recent Results (from the past 240 hour(s))  MRSA PCR Screening     Status: None   Collection Time: 09/29/17  1:32 AM  Result Value Ref Range Status   MRSA by PCR NEGATIVE  NEGATIVE Final    Comment:        The GeneXpert MRSA Assay (FDA approved for NASAL specimens only), is one component of a comprehensive MRSA colonization surveillance program. It is not intended to diagnose MRSA infection nor to guide or monitor treatment for MRSA infections. Performed at [facility], [place] [address], [place], [place] [address]          Radiology Studies: Ct Abdomen Pelvis W Contrast  Result Date: 09/28/2017 CLINICAL DATA:  Patient does have history of ulcer disease and acute esophagitis. He says this feels different than that. He reports about 4 AM he woke up and that pain is been continued since then. Patient took his chronic pain medication at home and did not get better. On arrival patient had a 10 out of 10 pain, however it is since feels better after lying still EXAM: CT ABDOMEN AND PELVIS WITH CONTRAST TECHNIQUE: Multidetector CT imaging of the abdomen and pelvis was performed using the standard protocol following bolus administration of intravenous contrast. CONTRAST:  <See Chart> ISOVUE-300 IOPAMIDOL (ISOVUE-300) INJECTION 61% COMPARISON:  None. FINDINGS: Lower  chest: No acute abnormality. Hepatobiliary: Liver normal in size and attenuation. No mass or focal lesions. There is mild intra and extrahepatic bile duct dilation. Common bile duct measures 1 cm proximally, but shows normal distal tapering. No evidence of a duct stone. Gallbladder is moderately distended. There are gallstones. No wall thickening. No adjacent inflammation. Pancreas: Unremarkable. No pancreatic ductal dilatation or surrounding inflammatory changes. Spleen: Normal in size without focal abnormality. Adrenals/Urinary Tract: No adrenal masses. Multiple bilateral low-density nonenhancing renal masses, largest arising from the anteromedial mid to lower pole of the left kidney measuring 5.1 cm. These are all consistent with cysts. No renal stones. No hydronephrosis. Ureters are normal  course and in caliber. Bladder is unremarkable. Stomach/Bowel: Stomach is unremarkable. Small bowel is normal caliber with no wall thickening or inflammation. There are numerous colonic diverticula. No evidence of diverticulitis or other colon inflammatory process. Appendix is normal. Vascular/Lymphatic: There are several prominent to mildly enlarged gastrohepatic ligament lymph nodes, largest measuring 11 mm in short axis. No other adenopathy. Atherosclerotic disease noted along the abdominal aorta and iliac vessels. No aneurysm. Reproductive: Prostate is mildly enlarged measuring 4.8 cm maximum transverse dimension. Other: No abdominal wall hernia or abnormality. No abdominopelvic ascites. Musculoskeletal: No fracture or acute finding. No osteoblastic or osteolytic lesions. There are significant degenerative changes of the mid to lower lumbar spine. IMPRESSION: 1. No acute findings within the abdomen or pelvis. 2. Extensive colonic diverticulosis with no evidence of diverticulitis. 3. Mild intra and extrahepatic bile duct dilation. No evidence bile duct obstruction. This is most likely chronic. 4. Gallstones.  No acute cholecystitis. 5. Multiple renal cysts. 6. Aortic atherosclerosis. Electronically Signed   By: Lajean Manes M.D.   On: 09/28/2017 17:29   Mr 3d Recon At Scanner  Result Date: 09/28/2017 CLINICAL DATA:  Abdominal pain. Abnormal liver function studies. Epigastric pain and vomiting. EXAM: MRI ABDOMEN WITHOUT AND WITH CONTRAST (INCLUDING MRCP) TECHNIQUE: Multiplanar multisequence MR imaging of the abdomen was performed both before and after the administration of intravenous contrast. Heavily T2-weighted images of the biliary and pancreatic ducts were obtained, and three-dimensional MRCP images were rendered by post processing. CONTRAST:  31mL MULTIHANCE GADOBENATE DIMEGLUMINE 529 MG/ML IV SOLN COMPARISON:  CT abdomen and pelvis 09/28/2017 FINDINGS: Examination is technically limited due to motion  artifact. Many sequences are nondiagnostic due to motion artifact. Lower chest: No specific acute abnormality identified. Hepatobiliary: Cholelithiasis with multiple large stones in the gallbladder. No inflammatory changes to suggest cholecystitis. Mild bile duct dilatation is demonstrated. No obstructing mass lesion or stone is identified. No focal liver lesions. Pancreas: No mass, inflammatory changes, or other parenchymal abnormality identified. No pancreatic ductal dilatation. Spleen:  Within normal limits in size and appearance. Adrenals/Urinary Tract: No adrenal gland nodules. Multiple bilateral renal parenchymal cysts. No hydronephrosis or solid mass identified. Stomach/Bowel: Visualized stomach, small bowel, and colon are not abnormally distended and no wall thickening is appreciated. Vascular/Lymphatic: Normal caliber abdominal aorta. No significant lymphadenopathy. Other:  None. Musculoskeletal: No suspicious bone lesions identified. IMPRESSION: 1. Cholelithiasis with large stones in the gallbladder. No evidence of cholecystitis. 2. Mild bile duct dilatation.  No cause identified. 3. Bilateral renal cysts.  No hydronephrosis. 4. Examination is technically limited due to motion artifact. Some sequences are nondiagnostic. Electronically Signed   By: Lucienne Capers M.D.   On: 09/28/2017 23:32   Dg Chest Portable 1 View  Result Date: 09/28/2017 CLINICAL DATA:  Chest pain, shortness of breath and chest tightness. EXAM: PORTABLE CHEST 1 VIEW  COMPARISON:  03/12/2017 FINDINGS: Cardiomediastinal silhouette is normal. Mediastinal contours appear intact. Tortuosity of the aorta. There is no evidence of focal airspace consolidation, pleural effusion or pneumothorax. Osseous structures are without acute abnormality. Soft tissues are grossly normal. IMPRESSION: No active disease. Electronically Signed   By: Fidela Salisbury M.D.   On: 09/28/2017 15:39   Mr Abdomen Mrcp Moise Boring Contast  Result Date:  09/28/2017 CLINICAL DATA:  Abdominal pain. Abnormal liver function studies. Epigastric pain and vomiting. EXAM: MRI ABDOMEN WITHOUT AND WITH CONTRAST (INCLUDING MRCP) TECHNIQUE: Multiplanar multisequence MR imaging of the abdomen was performed both before and after the administration of intravenous contrast. Heavily T2-weighted images of the biliary and pancreatic ducts were obtained, and three-dimensional MRCP images were rendered by post processing. CONTRAST:  22mL MULTIHANCE GADOBENATE DIMEGLUMINE 529 MG/ML IV SOLN COMPARISON:  CT abdomen and pelvis 09/28/2017 FINDINGS: Examination is technically limited due to motion artifact. Many sequences are nondiagnostic due to motion artifact. Lower chest: No specific acute abnormality identified. Hepatobiliary: Cholelithiasis with multiple large stones in the gallbladder. No inflammatory changes to suggest cholecystitis. Mild bile duct dilatation is demonstrated. No obstructing mass lesion or stone is identified. No focal liver lesions. Pancreas: No mass, inflammatory changes, or other parenchymal abnormality identified. No pancreatic ductal dilatation. Spleen:  Within normal limits in size and appearance. Adrenals/Urinary Tract: No adrenal gland nodules. Multiple bilateral renal parenchymal cysts. No hydronephrosis or solid mass identified. Stomach/Bowel: Visualized stomach, small bowel, and colon are not abnormally distended and no wall thickening is appreciated. Vascular/Lymphatic: Normal caliber abdominal aorta. No significant lymphadenopathy. Other:  None. Musculoskeletal: No suspicious bone lesions identified. IMPRESSION: 1. Cholelithiasis with large stones in the gallbladder. No evidence of cholecystitis. 2. Mild bile duct dilatation.  No cause identified. 3. Bilateral renal cysts.  No hydronephrosis. 4. Examination is technically limited due to motion artifact. Some sequences are nondiagnostic. Electronically Signed   By: Lucienne Capers M.D.   On: 09/28/2017  23:32   US Abdomen Limited Ruq  Result Date: 09/28/2017 CLINICAL DATA:  Right upper quadrant abdominal pain beginning this morning. EXAM: ULTRASOUND ABDOMEN LIMITED RIGHT UPPER QUADRANT COMPARISON:  Current abdomen pelvis CT FINDINGS: Gallbladder: Gallstones, largest measuring just under 3 cm. Stones cause posterior acoustic shadowing somewhat limiting evaluation of the gallbladder. No gallbladder wall thickening or pericholecystic fluid. There is some sludge. Common bile duct: Diameter: 10 mm.  No sonographic evidence of a duct stone. Liver: Normal size and parenchymal echogenicity. No mass or focal lesion. Mild central intrahepatic bile duct dilation. Portal vein is patent on color Doppler imaging with normal direction of blood flow towards the liver. IMPRESSION: 1. Gallstones without evidence of acute cholecystitis. 2. Mild intra and extrahepatic bile duct dilation as noted on the current CT. No sonographic evidence of a duct stone. Electronically Signed   By: Lajean Manes M.D.   On: 09/28/2017 19:04        Scheduled Meds: . enoxaparin (LOVENOX) injection  40 mg Subcutaneous QHS  . gi cocktail  30 mL Oral Once  . nicotine  21 mg Transdermal Daily   Continuous Infusions: . sodium chloride 150 mL/hr at 09/29/17 1227  . [START ON 09/30/2017]  ceFAZolin (ANCEF) IV    . famotidine (PEPCID) IV Stopped (09/29/17 1058)     LOS: 1 day    Time spent: More than 50% of that time was spent in counseling and/or coordination of care.      Shelly Coss, MD [facility] Pager [phone]  If 7PM-7AM, please  contact night-coverage [email or web address] Password [email or web address] 09/29/2017, 1:39 PM

## 2017-09-29 NOTE — Consult Note (Signed)
Reason for Consult: Abdominal pain, gallstones, elevated LFTs   referring Physician: [name] is an 77 y.o. male.  HPI: Patient is a pleasant 77 year old male who states he had the sudden onset of severe pain at about 4 AM yesterday.  Describes a burning and sharp pain in his epigastrium and substernal area and right side of his abdomen all the way down toward his right hip.  This was associated with nausea and vomiting.  Was very severe and he presented to the emergency department.  He states he had a similar episode of pain about a year ago and was evaluated in the emergency room but I do not have details.  Patient was found to have elevated LFTs.  He was admitted.  Apparently he has history of hiatal hernia, esophagitis and duodenal ulcer.  He is not clear on this.  His current presentation is not typical pain for him.  This has gradually improved since admission and is now mild. CT scan has shown multiple large gallstones and mildly dilated bile ducts.  MRCP showed mildly dilated bile ducts with no evidence of tumor or stone in the common bile duct.  He said no change in his bowel movements.  He states he has some urinary urgency and frequency on a chronic basis.  Only previous abdominal surgery is inguinal hernia repairs.  He has had benign tumors removed from his abdominal wall.  Since admission EKG and troponins have shown no evidence of cardiac source of his pain.  Past Medical History:  Diagnosis Date  . Acute esophagitis 06/01/2015  . Benign essential HTN 05/31/2015  . Duodenal ulcer hemorrhage   . Esophageal stricture 06/01/2015  . Hiatal hernia 06/01/2015    Past Surgical History:  Procedure Laterality Date  . ESOPHAGOGASTRODUODENOSCOPY N/A 05/31/2015   Procedure: ESOPHAGOGASTRODUODENOSCOPY (EGD);  Surgeon: Ladene Artist, MD;  Location: [facility];  Service: Endoscopy;  Laterality: N/A;  . HERNIA REPAIR  1975  . LEFT ROTATOR CUFF REPAIR  2002  . REPLACEMENT TOTAL  KNEE Left   . TOTAL KNEE ARTHROPLASTY Right 02/22/2015   Procedure: TOTAL KNEE ARTHROPLASTY;  Surgeon: Melrose Nakayama, MD;  Location: [facility];  Service: Orthopedics;  Laterality: Right;    Family History  Problem Relation Age of Onset  . Hypertension Mother   . Heart attack Sister     Social History:  reports that he has been smoking cigarettes.  He has a 25.50 pack-year smoking history. He quit smokeless tobacco use about 2 years ago. He reports that he does not drink alcohol or use drugs.  Allergies:  Allergies  Allergen Reactions  . Asa [Aspirin]     angioedema    Current Facility-Administered Medications  Medication Dose Route Frequency Provider Last Rate Last Dose  . 0.9 %  sodium chloride infusion   Intravenous Continuous Fuller Plan A, MD 150 mL/hr at 09/29/17 0457    . acetaminophen (TYLENOL) suppository 650 mg  650 mg Rectal Q6H PRN Tamala Julian, Rondell A, MD      . albuterol (PROVENTIL) (2.5 MG/3ML) 0.083% nebulizer solution 2.5 mg  2.5 mg Nebulization Q6H PRN Fuller Plan A, MD      . Derrill Memo ON 09/30/2017] ceFAZolin (ANCEF) IVPB 2g/100 mL premix  2 g Intravenous 90 min Pre-Op Excell Seltzer, MD      . enoxaparin (LOVENOX) injection 40 mg  40 mg Subcutaneous QHS Smith, Rondell A, MD   40 mg at 09/28/17 2359  . famotidine (PEPCID) IVPB 20 mg  premix  20 mg Intravenous Q12H Alonza Bogus D, PA-C 100 mL/hr at 09/29/17 1023 20 mg at 09/29/17 1023  . nicotine (NICODERM CQ - dosed in mg/24 hours) patch 21 mg  21 mg Transdermal Daily Fuller Plan A, MD   21 mg at 09/29/17 0907  . ondansetron (ZOFRAN) tablet 4 mg  4 mg Oral Q6H PRN Fuller Plan A, MD       Or  . ondansetron (ZOFRAN) injection 4 mg  4 mg Intravenous Q6H PRN Tamala Julian, Rondell A, MD      . oxyCODONE (Oxy IR/ROXICODONE) immediate release tablet 10 mg  10 mg Oral Q8H PRN Norval Morton, MD   10 mg at 09/29/17 0331     Results for orders placed or performed during the hospital encounter of 09/28/17 (from the past 48  hour(s))  Basic metabolic panel     Status: Abnormal   Collection Time: 09/28/17  3:06 PM  Result Value Ref Range   Sodium 136 135 - 145 mmol/L   Potassium 4.1 3.5 - 5.1 mmol/L   Chloride 96 (L) 101 - 111 mmol/L   CO2 27 22 - 32 mmol/L   Glucose, Bld 120 (H) 65 - 99 mg/dL   BUN 27 (H) 6 - 20 mg/dL   Creatinine, Ser 1.34 (H) 0.61 - 1.24 mg/dL   Calcium 9.9 8.9 - 10.3 mg/dL   GFR calc non Af Amer 50 (L) >60 mL/min   GFR calc Af Amer 58 (L) >60 mL/min    Comment: (NOTE) The eGFR has been calculated using the CKD EPI equation. This calculation has not been validated in all clinical situations. eGFR's persistently <60 mL/min signify possible Chronic Kidney Disease.    Anion gap 13 5 - 15    Comment: Performed at [facility], [place] [address], [place], [place] [address]  CBC     Status: Abnormal   Collection Time: 09/28/17  3:06 PM  Result Value Ref Range   WBC 12.5 (H) 4.0 - 10.5 K/uL   RBC 4.55 4.22 - 5.81 MIL/uL   Hemoglobin 14.4 13.0 - 17.0 g/dL   HCT 41.3 39.0 - 52.0 %   MCV 90.8 78.0 - 100.0 fL   MCH 31.6 26.0 - 34.0 pg   MCHC 34.9 30.0 - 36.0 g/dL   RDW 13.7 11.5 - 15.5 %   Platelets 340 150 - 400 K/uL    Comment: Performed at [facility], [place] [address] [place], [place] [address]  I-stat troponin, ED     Status: None   Collection Time: 09/28/17  3:19 PM  Result Value Ref Range   Troponin i, poc 0.01 0.00 - 0.08 ng/mL   Comment 3            Comment: Due to the release kinetics of cTnI, a negative result within the first hours of the onset of symptoms does not rule out myocardial infarction with certainty. If myocardial infarction is still suspected, repeat the test at appropriate intervals.   Hepatic function panel     Status: Abnormal   Collection Time: 09/28/17  4:54 PM  Result Value Ref Range   Total Protein 8.0 6.5 - 8.1 g/dL   Albumin 4.3 3.5 - 5.0 g/dL   AST 524 (H) 15 - 41 U/L   ALT 365 (H) 17 - 63 U/L   Alkaline  Phosphatase 150 (H) 38 - 126 U/L   Total Bilirubin 2.4 (H) 0.3 - 1.2 mg/dL   Bilirubin, Direct  0.7 (H) 0.1 - 0.5 mg/dL   Indirect Bilirubin 1.7 (H) 0.3 - 0.9 mg/dL    Comment: Performed at [facility], [place] [address], [place], [place] [address]  Lipase, blood     Status: None   Collection Time: 09/28/17  4:54 PM  Result Value Ref Range   Lipase 40 11 - 51 U/L    Comment: Performed at [facility], [place] [address], [place], [place] [address]  Acetaminophen level     Status: Abnormal   Collection Time: 09/28/17  8:22 PM  Result Value Ref Range   Acetaminophen (Tylenol), Serum <10 (L) 10 - 30 ug/mL    Comment: (NOTE) Therapeutic concentrations vary significantly. A range of 10-30 ug/mL  may be an effective concentration for many patients. However, some  are best treated at concentrations outside of this range. Acetaminophen concentrations >150 ug/mL at 4 hours after ingestion  and >50 ug/mL at 12 hours after ingestion are often associated with  toxic reactions. Performed at [facility], [place] [address] [place], [place] [address]   Urinalysis, Routine w reflex microscopic     Status: Abnormal   Collection Time: 09/28/17 10:07 PM  Result Value Ref Range   Color, Urine AMBER (A) YELLOW    Comment: BIOCHEMICALS MAY BE AFFECTED BY COLOR   APPearance CLEAR CLEAR   Specific Gravity, Urine 1.036 (H) 1.005 - 1.030   pH 7.0 5.0 - 8.0   Glucose, UA NEGATIVE NEGATIVE mg/dL   Hgb urine dipstick NEGATIVE NEGATIVE   Bilirubin Urine NEGATIVE NEGATIVE   Ketones, ur NEGATIVE NEGATIVE mg/dL   Protein, ur NEGATIVE NEGATIVE mg/dL   Nitrite NEGATIVE NEGATIVE   Leukocytes, UA NEGATIVE NEGATIVE    Comment: Performed at [facility], [place] [address] [place], [place] [address]  MRSA PCR Screening     Status: None   Collection Time: 09/29/17  1:32 AM  Result Value Ref Range   MRSA by PCR NEGATIVE NEGATIVE    Comment:         The GeneXpert MRSA Assay (FDA approved for NASAL specimens only), is one component of a comprehensive MRSA colonization surveillance program. It is not intended to diagnose MRSA infection nor to guide or monitor treatment for MRSA infections. Performed at [facility], [place] [address], [place], [place] [address]   CBC     Status: Abnormal   Collection Time: 09/29/17  5:30 AM  Result Value Ref Range   WBC 9.4 4.0 - 10.5 K/uL   RBC 4.16 (L) 4.22 - 5.81 MIL/uL   Hemoglobin 13.0 13.0 - 17.0 g/dL   HCT 37.8 (L) 39.0 - 52.0 %   MCV 90.9 78.0 - 100.0 fL   MCH 31.3 26.0 - 34.0 pg   MCHC 34.4 30.0 - 36.0 g/dL   RDW 13.8 11.5 - 15.5 %   Platelets 319 150 - 400 K/uL    Comment: Performed at [facility], [place] [address] [place], [place] [address]  Comprehensive metabolic panel     Status: Abnormal   Collection Time: 09/29/17  5:30 AM  Result Value Ref Range   Sodium 139 135 - 145 mmol/L   Potassium 3.9 3.5 - 5.1 mmol/L   Chloride 105 101 - 111 mmol/L   CO2 24 22 - 32 mmol/L   Glucose, Bld 108 (H) 65 - 99 mg/dL   BUN 22 (H) 6 - 20 mg/dL   Creatinine, Ser 1.04 0.61 - 1.24 mg/dL  Calcium 9.8 8.9 - 10.3 mg/dL   Total Protein 6.9 6.5 - 8.1 g/dL   Albumin 3.5 3.5 - 5.0 g/dL   AST 447 (H) 15 - 41 U/L   ALT 592 (H) 17 - 63 U/L   Alkaline Phosphatase 228 (H) 38 - 126 U/L   Total Bilirubin 3.7 (H) 0.3 - 1.2 mg/dL   GFR calc non Af Amer >60 >60 mL/min   GFR calc Af Amer >60 >60 mL/min    Comment: (NOTE) The eGFR has been calculated using the CKD EPI equation. This calculation has not been validated in all clinical situations. eGFR's persistently <60 mL/min signify possible Chronic Kidney Disease.    Anion gap 10 5 - 15    Comment: Performed at [facility], [place] [address], [place], [place] [address]  Sedimentation rate     Status: Abnormal   Collection Time: 09/29/17  5:30 AM  Result Value Ref Range   Sed Rate 22 (H) 0 - 16  mm/hr    Comment: Performed at [facility], [place] [address], [place], [place] [address]    Ct Abdomen Pelvis W Contrast  Result Date: 09/28/2017 CLINICAL DATA:  Patient does have history of ulcer disease and acute esophagitis. He says this feels different than that. He reports about 4 AM he woke up and that pain is been continued since then. Patient took his chronic pain medication at home and did not get better. On arrival patient had a 10 out of 10 pain, however it is since feels better after lying still EXAM: CT ABDOMEN AND PELVIS WITH CONTRAST TECHNIQUE: Multidetector CT imaging of the abdomen and pelvis was performed using the standard protocol following bolus administration of intravenous contrast. CONTRAST:  <See Chart> ISOVUE-300 IOPAMIDOL (ISOVUE-300) INJECTION 61% COMPARISON:  None. FINDINGS: Lower chest: No acute abnormality. Hepatobiliary: Liver normal in size and attenuation. No mass or focal lesions. There is mild intra and extrahepatic bile duct dilation. Common bile duct measures 1 cm proximally, but shows normal distal tapering. No evidence of a duct stone. Gallbladder is moderately distended. There are gallstones. No wall thickening. No adjacent inflammation. Pancreas: Unremarkable. No pancreatic ductal dilatation or surrounding inflammatory changes. Spleen: Normal in size without focal abnormality. Adrenals/Urinary Tract: No adrenal masses. Multiple bilateral low-density nonenhancing renal masses, largest arising from the anteromedial mid to lower pole of the left kidney measuring 5.1 cm. These are all consistent with cysts. No renal stones. No hydronephrosis. Ureters are normal course and in caliber. Bladder is unremarkable. Stomach/Bowel: Stomach is unremarkable. Small bowel is normal caliber with no wall thickening or inflammation. There are numerous colonic diverticula. No evidence of diverticulitis or other colon inflammatory process. Appendix is normal.  Vascular/Lymphatic: There are several prominent to mildly enlarged gastrohepatic ligament lymph nodes, largest measuring 11 mm in short axis. No other adenopathy. Atherosclerotic disease noted along the abdominal aorta and iliac vessels. No aneurysm. Reproductive: Prostate is mildly enlarged measuring 4.8 cm maximum transverse dimension. Other: No abdominal wall hernia or abnormality. No abdominopelvic ascites. Musculoskeletal: No fracture or acute finding. No osteoblastic or osteolytic lesions. There are significant degenerative changes of the mid to lower lumbar spine. IMPRESSION: 1. No acute findings within the abdomen or pelvis. 2. Extensive colonic diverticulosis with no evidence of diverticulitis. 3. Mild intra and extrahepatic bile duct dilation. No evidence bile duct obstruction. This is most likely chronic. 4. Gallstones.  No acute cholecystitis. 5. Multiple renal cysts. 6. Aortic atherosclerosis. Electronically Signed   By: Shanon Brow  Ormond M.D.   On: 09/28/2017 17:29   Mr 3d Recon At Scanner  Result Date: 09/28/2017 CLINICAL DATA:  Abdominal pain. Abnormal liver function studies. Epigastric pain and vomiting. EXAM: MRI ABDOMEN WITHOUT AND WITH CONTRAST (INCLUDING MRCP) TECHNIQUE: Multiplanar multisequence MR imaging of the abdomen was performed both before and after the administration of intravenous contrast. Heavily T2-weighted images of the biliary and pancreatic ducts were obtained, and three-dimensional MRCP images were rendered by post processing. CONTRAST:  61m MULTIHANCE GADOBENATE DIMEGLUMINE 529 MG/ML IV SOLN COMPARISON:  CT abdomen and pelvis 09/28/2017 FINDINGS: Examination is technically limited due to motion artifact. Many sequences are nondiagnostic due to motion artifact. Lower chest: No specific acute abnormality identified. Hepatobiliary: Cholelithiasis with multiple large stones in the gallbladder. No inflammatory changes to suggest cholecystitis. Mild bile duct dilatation is  demonstrated. No obstructing mass lesion or stone is identified. No focal liver lesions. Pancreas: No mass, inflammatory changes, or other parenchymal abnormality identified. No pancreatic ductal dilatation. Spleen:  Within normal limits in size and appearance. Adrenals/Urinary Tract: No adrenal gland nodules. Multiple bilateral renal parenchymal cysts. No hydronephrosis or solid mass identified. Stomach/Bowel: Visualized stomach, small bowel, and colon are not abnormally distended and no wall thickening is appreciated. Vascular/Lymphatic: Normal caliber abdominal aorta. No significant lymphadenopathy. Other:  None. Musculoskeletal: No suspicious bone lesions identified. IMPRESSION: 1. Cholelithiasis with large stones in the gallbladder. No evidence of cholecystitis. 2. Mild bile duct dilatation.  No cause identified. 3. Bilateral renal cysts.  No hydronephrosis. 4. Examination is technically limited due to motion artifact. Some sequences are nondiagnostic. Electronically Signed   By: WLucienne CapersM.D.   On: 09/28/2017 23:32   Dg Chest Portable 1 View  Result Date: 09/28/2017 CLINICAL DATA:  Chest pain, shortness of breath and chest tightness. EXAM: PORTABLE CHEST 1 VIEW COMPARISON:  03/12/2017 FINDINGS: Cardiomediastinal silhouette is normal. Mediastinal contours appear intact. Tortuosity of the aorta. There is no evidence of focal airspace consolidation, pleural effusion or pneumothorax. Osseous structures are without acute abnormality. Soft tissues are grossly normal. IMPRESSION: No active disease. Electronically Signed   By: DFidela SalisburyM.D.   On: 09/28/2017 15:39   Mr Abdomen Mrcp WMoise BoringContast  Result Date: 09/28/2017 CLINICAL DATA:  Abdominal pain. Abnormal liver function studies. Epigastric pain and vomiting. EXAM: MRI ABDOMEN WITHOUT AND WITH CONTRAST (INCLUDING MRCP) TECHNIQUE: Multiplanar multisequence MR imaging of the abdomen was performed both before and after the administration of  intravenous contrast. Heavily T2-weighted images of the biliary and pancreatic ducts were obtained, and three-dimensional MRCP images were rendered by post processing. CONTRAST:  276mMULTIHANCE GADOBENATE DIMEGLUMINE 529 MG/ML IV SOLN COMPARISON:  CT abdomen and pelvis 09/28/2017 FINDINGS: Examination is technically limited due to motion artifact. Many sequences are nondiagnostic due to motion artifact. Lower chest: No specific acute abnormality identified. Hepatobiliary: Cholelithiasis with multiple large stones in the gallbladder. No inflammatory changes to suggest cholecystitis. Mild bile duct dilatation is demonstrated. No obstructing mass lesion or stone is identified. No focal liver lesions. Pancreas: No mass, inflammatory changes, or other parenchymal abnormality identified. No pancreatic ductal dilatation. Spleen:  Within normal limits in size and appearance. Adrenals/Urinary Tract: No adrenal gland nodules. Multiple bilateral renal parenchymal cysts. No hydronephrosis or solid mass identified. Stomach/Bowel: Visualized stomach, small bowel, and colon are not abnormally distended and no wall thickening is appreciated. Vascular/Lymphatic: Normal caliber abdominal aorta. No significant lymphadenopathy. Other:  None. Musculoskeletal: No suspicious bone lesions identified. IMPRESSION: 1. Cholelithiasis with large stones in the gallbladder. No  evidence of cholecystitis. 2. Mild bile duct dilatation.  No cause identified. 3. Bilateral renal cysts.  No hydronephrosis. 4. Examination is technically limited due to motion artifact. Some sequences are nondiagnostic. Electronically Signed   By: Lucienne Capers M.D.   On: 09/28/2017 23:32   US Abdomen Limited Ruq  Result Date: 09/28/2017 CLINICAL DATA:  Right upper quadrant abdominal pain beginning this morning. EXAM: ULTRASOUND ABDOMEN LIMITED RIGHT UPPER QUADRANT COMPARISON:  Current abdomen pelvis CT FINDINGS: Gallbladder: Gallstones, largest measuring just  under 3 cm. Stones cause posterior acoustic shadowing somewhat limiting evaluation of the gallbladder. No gallbladder wall thickening or pericholecystic fluid. There is some sludge. Common bile duct: Diameter: 10 mm.  No sonographic evidence of a duct stone. Liver: Normal size and parenchymal echogenicity. No mass or focal lesion. Mild central intrahepatic bile duct dilation. Portal vein is patent on color Doppler imaging with normal direction of blood flow towards the liver. IMPRESSION: 1. Gallstones without evidence of acute cholecystitis. 2. Mild intra and extrahepatic bile duct dilation as noted on the current CT. No sonographic evidence of a duct stone. Electronically Signed   By: Lajean Manes M.D.   On: 09/28/2017 19:04    Review of Systems  Constitutional: Negative for chills and fever.  Respiratory: Negative.   Cardiovascular: Positive for chest pain. Negative for palpitations and leg swelling.  Gastrointestinal: Positive for abdominal pain, heartburn, nausea and vomiting. Negative for constipation and diarrhea.  Genitourinary: Positive for frequency.  Musculoskeletal: Positive for back pain, joint pain and myalgias.   Blood pressure 102/60, pulse 74, temperature 98.5 F (36.9 C), resp. rate 20, height '5\' 9"'  (1.753 m), weight 87.5 kg (192 lb 14.4 oz), SpO2 96 %. Physical Exam General: Alert, well-developed pleasant elderly African-American male, in no distress Skin: Warm and dry without rash or infection. HEENT: No palpable masses or thyromegaly. Sclera nonicteric.  Lymph nodes: No cervical, supraclavicular, or inguinal nodes palpable. Lungs: Breath sounds distant but clear and equal without increased work of breathing Cardiovascular: Regular rate and rhythm without murmur. No JVD or edema.Abdomen: Nondistended. Soft and nontender. No masses palpable. No organomegaly. No palpable hernias. Extremities: No edema or joint swelling or deformity.  Healed incisions both knees.  No chronic  venous stasis changes. Neurologic: Alert and fully oriented.  Affect normal.  No gross motor deficits.  Assessment/Plan: Acute severe abdominal and chest pain onset yesterday a little atypical but certainly consistent with severe biliary colic.  Pain is essentially gone today.  He has multiple large gallstones.  LFTs are significantly elevated.  MRCP did not show common bile duct stone but LFTs are actually higher today than yesterday.  This very likely is biliary colic, possible passed common bile duct stone. I think laparoscopic cholecystectomy is indicated to prevent further attacks and complications from his gallstones.  I will plan to schedule for tomorrow morning.  I would like to repeat his LFTs tomorrow preoperatively.  If they continue to rise I would discuss with GI possible preoperative ERCP.  This was all discussed with the patient.I discussed the procedure in detail.  The patient was given Neurosurgeon.  We discussed the risks and benefits of a laparoscopic cholecystectomy and  cholangiogram including, but not limited to bleeding, infection, injury to surrounding structures such as the intestine or liver, bile leak, retained gallstones, need to convert to an open procedure, prolonged diarrhea, blood clots such as  DVT, common bile duct injury, anesthesia risks, and possible need for additional procedures.  The likelihood of  improvement in symptoms and return to the patient's normal status is good. We discussed the typical post-operative recovery course.  Darene Lamer Yeudiel Mateo 09/29/2017, 10:35 AM

## 2017-09-29 NOTE — Consult Note (Addendum)
Referring Provider:  Dr. Tawanna Solo, [facility] Primary Care Physician:  Janith Lima, MD Primary Gastroenterologist:  Dr. Fuller Plan  Reason for Consultation:  Abdominal pain and elevated LFT's  HPI: Adrian Miller is a 77 y.o. male with medical history significant of HTN, HLD, diastolic dysfunction last EF 60 - 65% with grade 2 diastolic dysfunction, duodenal ulcer and esophagitis (ulcers from [facility] and Goody powders that he was using at that time).  He presented to [facility] ED with complaints of abdominal/chest pain.  He says that the pain woke him up around 4 AM.  Described the pain in his chest as a burning pain and it does get better with GI cocktail.  Patient reports symptoms worsened with inspiratory breathing.  Rates pain at its worse as a 10 out of 10 on the pain scale; says that it was a "hard pain".  He tried taking home pain medications of Percocet without relief.  Patient has previous history of esophagitis and duodenal ulcer not currently on any antiacid medication, however, he feels that the symptoms he is currently having are different.  Had nausea and vomiting as well.   ED Course: Admission into the emergency department patient was noted to be afebrile, pulse 59-87, respiration 14-26, blood pressure 108/62-186/87, O2 saturation maintained on room air.  Labs revealed WBC 12.5, BUN 27, creatinine 1.34, lipase 40, alkaline phosphatase 150, AST 524, ALT 365, indirect bilirubin 1.7, direct bilirubin 0.7.  Chest x-ray showed no acute abnormalities.  CT scan of the abdomen showed mild intrahepatic dilatation and cholelithiasis without signs of cholecystitis confirmed on ultrasound.  GI was consulted and recommended admission and to check a MRCP.  Patient received 25 mcg of fentanyl and TRH called to admit  MRCP as follows:  IMPRESSION: 1. Cholelithiasis with large stones in the gallbladder. No evidence of cholecystitis. 2. Mild bile duct dilatation.  No cause identified. 3. Bilateral renal cysts.  No  hydronephrosis. 4. Examination is technically limited due to motion artifact. Some sequences are nondiagnostic.  EGD 05/2015 with Dr. Fuller Plan as follows:  ENDOSCOPIC IMPRESSION: 1. Three ulcers in the duodenal bulb 2. LA Class B esophagitis 3. Stricture at the gastroesophageal junction 4. Small hiatal hernia  Was supposed to be taking daily PPI, but has not been on any acid medication at home.  LFT's actually trending up this AM with total bili 3.7, ALP 228, ALT 592, and AST 447.   Past Medical History:  Diagnosis Date  . Acute esophagitis 06/01/2015  . Benign essential HTN 05/31/2015  . Duodenal ulcer hemorrhage   . Esophageal stricture 06/01/2015  . Hiatal hernia 06/01/2015    Past Surgical History:  Procedure Laterality Date  . ESOPHAGOGASTRODUODENOSCOPY N/A 05/31/2015   Procedure: ESOPHAGOGASTRODUODENOSCOPY (EGD);  Surgeon: Ladene Artist, MD;  Location: [facility];  Service: Endoscopy;  Laterality: N/A;  . HERNIA REPAIR  1975  . LEFT ROTATOR CUFF REPAIR  2002  . REPLACEMENT TOTAL KNEE Left   . TOTAL KNEE ARTHROPLASTY Right 02/22/2015   Procedure: TOTAL KNEE ARTHROPLASTY;  Surgeon: Melrose Nakayama, MD;  Location: [place];  Service: Orthopedics;  Laterality: Right;    Prior to Admission medications   Medication Sig Start Date End Date Taking? Authorizing Provider  chlorthalidone (HYGROTON) 25 MG tablet TAKE 1 TABLET BY MOUTH EVERY DAY 07/05/17  Yes Janith Lima, MD  irbesartan (AVAPRO) 300 MG tablet Take 1 tablet (300 mg total) by mouth daily. 08/27/17  Yes Janith Lima, MD  oxyCODONE-acetaminophen (PERCOCET) 10-325 MG tablet  Take 1 tablet by mouth every 8 (eight) hours as needed for pain. 08/28/17  Yes Janith Lima, MD  atorvastatin (LIPITOR) 20 MG tablet Take 1 tablet (20 mg total) by mouth daily. Patient not taking: Reported on 09/28/2017 12/27/15   Janith Lima, MD  cyanocobalamin 2000 MCG tablet Take 1 tablet (2,000 mcg total) by mouth daily. Patient not taking:  Reported on 09/28/2017 06/14/15   Janith Lima, MD  ferrous sulfate 325 (65 FE) MG tablet Take 1 tablet (325 mg total) by mouth 3 (three) times daily with meals. Patient not taking: Reported on 09/28/2017 06/14/15   Janith Lima, MD  furosemide (LASIX) 20 MG tablet Take 1 tablet (20 mg total) by mouth daily as needed for edema. Patient not taking: Reported on 09/28/2017 12/28/15   Lyndal Pulley, DO  gabapentin (NEURONTIN) 100 MG capsule Take 2 capsules (200 mg total) by mouth at bedtime. Patient not taking: Reported on 09/28/2017 01/18/16   Lyndal Pulley, DO  pantoprazole (PROTONIX) 40 MG tablet Take 1 tablet (40 mg total) by mouth daily. Patient not taking: Reported on 09/28/2017 12/19/15   Louellen Molder, MD    Current Facility-Administered Medications  Medication Dose Route Frequency Provider Last Rate Last Dose  . 0.9 %  sodium chloride infusion   Intravenous Continuous Fuller Plan A, MD 150 mL/hr at 09/29/17 0457    . acetaminophen (TYLENOL) suppository 650 mg  650 mg Rectal Q6H PRN Smith, Rondell A, MD      . albuterol (PROVENTIL) (2.5 MG/3ML) 0.083% nebulizer solution 2.5 mg  2.5 mg Nebulization Q6H PRN Smith, Rondell A, MD      . enoxaparin (LOVENOX) injection 40 mg  40 mg Subcutaneous QHS Smith, Rondell A, MD   40 mg at 09/28/17 2359  . nicotine (NICODERM CQ - dosed in mg/24 hours) patch 21 mg  21 mg Transdermal Daily Fuller Plan A, MD   21 mg at 09/29/17 0907  . ondansetron (ZOFRAN) tablet 4 mg  4 mg Oral Q6H PRN Fuller Plan A, MD       Or  . ondansetron (ZOFRAN) injection 4 mg  4 mg Intravenous Q6H PRN Smith, Rondell A, MD      . oxyCODONE (Oxy IR/ROXICODONE) immediate release tablet 10 mg  10 mg Oral Q8H PRN Fuller Plan A, MD   10 mg at 09/29/17 0331    Allergies as of 09/28/2017 - Review Complete 09/28/2017  Allergen Reaction Noted  . Asa [aspirin]  03/06/2013    Family History  Problem Relation Age of Onset  . Hypertension Mother   . Heart attack Sister      Social History   Socioeconomic History  . Marital status: Single    Spouse name: Not on file  . Number of children: Not on file  . Years of education: Not on file  . Highest education level: Not on file  Occupational History  . Not on file  Social Needs  . Financial resource strain: Not on file  . Food insecurity:    Worry: Not on file    Inability: Not on file  . Transportation needs:    Medical: Not on file    Non-medical: Not on file  Tobacco Use  . Smoking status: Current Every Day Smoker    Packs/day: 0.50    Years: 51.00    Pack years: 25.50    Types: Cigarettes  . Smokeless tobacco: Former Systems developer    Quit date: 11/03/2014  . Tobacco  comment: quit 2 weeks ago  Substance and Sexual Activity  . Alcohol use: No    Alcohol/week: 0.0 oz  . Drug use: No  . Sexual activity: Never  Lifestyle  . Physical activity:    Days per week: Not on file    Minutes per session: Not on file  . Stress: Not on file  Relationships  . Social connections:    Talks on phone: Not on file    Gets together: Not on file    Attends religious service: Not on file    Active member of club or organization: Not on file    Attends meetings of clubs or organizations: Not on file    Relationship status: Not on file  . Intimate partner violence:    Fear of current or ex partner: Not on file    Emotionally abused: Not on file    Physically abused: Not on file    Forced sexual activity: Not on file  Other Topics Concern  . Not on file  Social History Narrative  . Not on file    Review of Systems: ROS is O/E negative except as mentioned in HPI.  Physical Exam: Vital signs in last 24 hours: Temp:  [97.7 F (36.5 C)-99.2 F (37.3 C)] 98.5 F (36.9 C) (05/26 0442) Pulse Rate:  [59-92] 74 (05/26 0442) Resp:  [14-26] 20 (05/26 0442) BP: (102-186)/(58-94) 102/60 (05/26 0442) SpO2:  [94 %-100 %] 96 % (05/26 0442) Weight:  [191 lb 12.8 oz (87 kg)-205 lb (93 kg)] 192 lb 14.4 oz (87.5 kg)  (05/26 0442) Last BM Date: 09/26/17(Pt. unsure ) General:  Alert, Well-developed, well-nourished, pleasant and cooperative in NAD Head:  Normocephalic and atraumatic. Eyes:  Sclera clear, no icterus.  Conjunctiva pink. Ears:  Normal auditory acuity. Mouth:  No deformity or lesions.   Lungs:  Clear throughout to auscultation.  No wheezes, crackles, or rhonchi.  Heart:  Regular rate and rhythm; no murmurs, clicks, rubs, or gallops. Abdomen:  Soft, non-distended.  BS present.  Actually has diffuse TTP. Msk:  Symmetrical without gross deformities. Pulses:  Normal pulses noted. Extremities:  Without clubbing or edema. Neurologic:  Alert and oriented x 4;  grossly normal neurologically. Skin:  Intact without significant lesions or rashes. Psych:  Alert and cooperative. Normal mood and affect.  Intake/Output from previous day: 05/25 0701 - 05/26 0700 In: 877.5 [I.V.:877.5] Out: -   Lab Results: Recent Labs    09/28/17 1506 09/29/17 0530  WBC 12.5* 9.4  HGB 14.4 13.0  HCT 41.3 37.8*  PLT 340 319   BMET Recent Labs    09/28/17 1506 09/29/17 0530  NA 136 139  K 4.1 3.9  CL 96* 105  CO2 27 24  GLUCOSE 120* 108*  BUN 27* 22*  CREATININE 1.34* 1.04  CALCIUM 9.9 9.8   LFT Recent Labs    09/28/17 1654 09/29/17 0530  PROT 8.0 6.9  ALBUMIN 4.3 3.5  AST 524* 447*  ALT 365* 592*  ALKPHOS 150* 228*  BILITOT 2.4* 3.7*  BILIDIR 0.7*  --   IBILI 1.7*  --    Studies/Results: Ct Abdomen Pelvis W Contrast  Result Date: 09/28/2017 CLINICAL DATA:  Patient does have history of ulcer disease and acute esophagitis. He says this feels different than that. He reports about 4 AM he woke up and that pain is been continued since then. Patient took his chronic pain medication at home and did not get better. On arrival patient had a 10 out  of 10 pain, however it is since feels better after lying still EXAM: CT ABDOMEN AND PELVIS WITH CONTRAST TECHNIQUE: Multidetector CT imaging of the  abdomen and pelvis was performed using the standard protocol following bolus administration of intravenous contrast. CONTRAST:  <See Chart> ISOVUE-300 IOPAMIDOL (ISOVUE-300) INJECTION 61% COMPARISON:  None. FINDINGS: Lower chest: No acute abnormality. Hepatobiliary: Liver normal in size and attenuation. No mass or focal lesions. There is mild intra and extrahepatic bile duct dilation. Common bile duct measures 1 cm proximally, but shows normal distal tapering. No evidence of a duct stone. Gallbladder is moderately distended. There are gallstones. No wall thickening. No adjacent inflammation. Pancreas: Unremarkable. No pancreatic ductal dilatation or surrounding inflammatory changes. Spleen: Normal in size without focal abnormality. Adrenals/Urinary Tract: No adrenal masses. Multiple bilateral low-density nonenhancing renal masses, largest arising from the anteromedial mid to lower pole of the left kidney measuring 5.1 cm. These are all consistent with cysts. No renal stones. No hydronephrosis. Ureters are normal course and in caliber. Bladder is unremarkable. Stomach/Bowel: Stomach is unremarkable. Small bowel is normal caliber with no wall thickening or inflammation. There are numerous colonic diverticula. No evidence of diverticulitis or other colon inflammatory process. Appendix is normal. Vascular/Lymphatic: There are several prominent to mildly enlarged gastrohepatic ligament lymph nodes, largest measuring 11 mm in short axis. No other adenopathy. Atherosclerotic disease noted along the abdominal aorta and iliac vessels. No aneurysm. Reproductive: Prostate is mildly enlarged measuring 4.8 cm maximum transverse dimension. Other: No abdominal wall hernia or abnormality. No abdominopelvic ascites. Musculoskeletal: No fracture or acute finding. No osteoblastic or osteolytic lesions. There are significant degenerative changes of the mid to lower lumbar spine. IMPRESSION: 1. No acute findings within the abdomen or  pelvis. 2. Extensive colonic diverticulosis with no evidence of diverticulitis. 3. Mild intra and extrahepatic bile duct dilation. No evidence bile duct obstruction. This is most likely chronic. 4. Gallstones.  No acute cholecystitis. 5. Multiple renal cysts. 6. Aortic atherosclerosis. Electronically Signed   By: Lajean Manes M.D.   On: 09/28/2017 17:29   Mr 3d Recon At Scanner  Result Date: 09/28/2017 CLINICAL DATA:  Abdominal pain. Abnormal liver function studies. Epigastric pain and vomiting. EXAM: MRI ABDOMEN WITHOUT AND WITH CONTRAST (INCLUDING MRCP) TECHNIQUE: Multiplanar multisequence MR imaging of the abdomen was performed both before and after the administration of intravenous contrast. Heavily T2-weighted images of the biliary and pancreatic ducts were obtained, and three-dimensional MRCP images were rendered by post processing. CONTRAST:  41mL MULTIHANCE GADOBENATE DIMEGLUMINE 529 MG/ML IV SOLN COMPARISON:  CT abdomen and pelvis 09/28/2017 FINDINGS: Examination is technically limited due to motion artifact. Many sequences are nondiagnostic due to motion artifact. Lower chest: No specific acute abnormality identified. Hepatobiliary: Cholelithiasis with multiple large stones in the gallbladder. No inflammatory changes to suggest cholecystitis. Mild bile duct dilatation is demonstrated. No obstructing mass lesion or stone is identified. No focal liver lesions. Pancreas: No mass, inflammatory changes, or other parenchymal abnormality identified. No pancreatic ductal dilatation. Spleen:  Within normal limits in size and appearance. Adrenals/Urinary Tract: No adrenal gland nodules. Multiple bilateral renal parenchymal cysts. No hydronephrosis or solid mass identified. Stomach/Bowel: Visualized stomach, small bowel, and colon are not abnormally distended and no wall thickening is appreciated. Vascular/Lymphatic: Normal caliber abdominal aorta. No significant lymphadenopathy. Other:  None. Musculoskeletal:  No suspicious bone lesions identified. IMPRESSION: 1. Cholelithiasis with large stones in the gallbladder. No evidence of cholecystitis. 2. Mild bile duct dilatation.  No cause identified. 3. Bilateral renal cysts.  No  hydronephrosis. 4. Examination is technically limited due to motion artifact. Some sequences are nondiagnostic. Electronically Signed   By: Lucienne Capers M.D.   On: 09/28/2017 23:32   Dg Chest Portable 1 View  Result Date: 09/28/2017 CLINICAL DATA:  Chest pain, shortness of breath and chest tightness. EXAM: PORTABLE CHEST 1 VIEW COMPARISON:  03/12/2017 FINDINGS: Cardiomediastinal silhouette is normal. Mediastinal contours appear intact. Tortuosity of the aorta. There is no evidence of focal airspace consolidation, pleural effusion or pneumothorax. Osseous structures are without acute abnormality. Soft tissues are grossly normal. IMPRESSION: No active disease. Electronically Signed   By: Fidela Salisbury M.D.   On: 09/28/2017 15:39   Mr Abdomen Mrcp Moise Boring Contast  Result Date: 09/28/2017 CLINICAL DATA:  Abdominal pain. Abnormal liver function studies. Epigastric pain and vomiting. EXAM: MRI ABDOMEN WITHOUT AND WITH CONTRAST (INCLUDING MRCP) TECHNIQUE: Multiplanar multisequence MR imaging of the abdomen was performed both before and after the administration of intravenous contrast. Heavily T2-weighted images of the biliary and pancreatic ducts were obtained, and three-dimensional MRCP images were rendered by post processing. CONTRAST:  28mL MULTIHANCE GADOBENATE DIMEGLUMINE 529 MG/ML IV SOLN COMPARISON:  CT abdomen and pelvis 09/28/2017 FINDINGS: Examination is technically limited due to motion artifact. Many sequences are nondiagnostic due to motion artifact. Lower chest: No specific acute abnormality identified. Hepatobiliary: Cholelithiasis with multiple large stones in the gallbladder. No inflammatory changes to suggest cholecystitis. Mild bile duct dilatation is demonstrated. No  obstructing mass lesion or stone is identified. No focal liver lesions. Pancreas: No mass, inflammatory changes, or other parenchymal abnormality identified. No pancreatic ductal dilatation. Spleen:  Within normal limits in size and appearance. Adrenals/Urinary Tract: No adrenal gland nodules. Multiple bilateral renal parenchymal cysts. No hydronephrosis or solid mass identified. Stomach/Bowel: Visualized stomach, small bowel, and colon are not abnormally distended and no wall thickening is appreciated. Vascular/Lymphatic: Normal caliber abdominal aorta. No significant lymphadenopathy. Other:  None. Musculoskeletal: No suspicious bone lesions identified. IMPRESSION: 1. Cholelithiasis with large stones in the gallbladder. No evidence of cholecystitis. 2. Mild bile duct dilatation.  No cause identified. 3. Bilateral renal cysts.  No hydronephrosis. 4. Examination is technically limited due to motion artifact. Some sequences are nondiagnostic. Electronically Signed   By: Lucienne Capers M.D.   On: 09/28/2017 23:32   US Abdomen Limited Ruq  Result Date: 09/28/2017 CLINICAL DATA:  Right upper quadrant abdominal pain beginning this morning. EXAM: ULTRASOUND ABDOMEN LIMITED RIGHT UPPER QUADRANT COMPARISON:  Current abdomen pelvis CT FINDINGS: Gallbladder: Gallstones, largest measuring just under 3 cm. Stones cause posterior acoustic shadowing somewhat limiting evaluation of the gallbladder. No gallbladder wall thickening or pericholecystic fluid. There is some sludge. Common bile duct: Diameter: 10 mm.  No sonographic evidence of a duct stone. Liver: Normal size and parenchymal echogenicity. No mass or focal lesion. Mild central intrahepatic bile duct dilation. Portal vein is patent on color Doppler imaging with normal direction of blood flow towards the liver. IMPRESSION: 1. Gallstones without evidence of acute cholecystitis. 2. Mild intra and extrahepatic bile duct dilation as noted on the current CT. No sonographic  evidence of a duct stone. Electronically Signed   By: Lajean Manes M.D.   On: 09/28/2017 19:04   IMPRESSION:  *77 year old male presenting with upper abdominal pain and chest pain along with nausea and vomiting.  Found to have elevated LFT's, which were normal one month ago.  Ultrasound, CT scan, MRCP show large gallstones and some mild biliary dilation, but no obstructing stone identified.  ?  If he passed a stone. *History of duodenal ulcers (NSAID induced), esophagitis, esophageal stricture, hiatal hernia:  Was not on PPI at home.  Needs PPI, and some of the burning may be GI related especially since that improved with GI cocktail, but these issues certainly would not cause liver enzyme elevation.  PLAN: *I spoke with Dr. Excell Seltzer who will see the patient as well. *Will place on pepcid 20 mg IV BID for now. *Trend LFT's.  Janett Billow D. [name]  09/29/2017, 9:08 AM   Attending physician's note   I have taken an interval history, reviewed the chart and examined the patient. I agree with the Advanced Practitioner's note, impression and recommendations.   77 year old with biliary colic and abnormal liver function tests.  MRCP negative, may have passed the stone.  Plan to do laparoscopic cholecystectomy with Intra-Op cholangiogram tomorrow.  If IOC is positive, then proceed with ERCP. patient with previous history of nonsteroidal induced duodenal ulcers, small hiatal hernia, esophageal stricture status post esophageal dilatation 05/2015.  Need PPIs as outpatient.  Carmell Austria, MD

## 2017-09-29 NOTE — H&P (View-Only) (Signed)
Reason for Consult: Abdominal pain, gallstones, elevated LFTs   referring Physician: [name] is an 77 y.o. male.  HPI: Patient is a pleasant 77 year old male who states he had the sudden onset of severe pain at about 4 AM yesterday.  Describes a burning and sharp pain in his epigastrium and substernal area and right side of his abdomen all the way down toward his right hip.  This was associated with nausea and vomiting.  Was very severe and he presented to the emergency department.  He states he had a similar episode of pain about a year ago and was evaluated in the emergency room but I do not have details.  Patient was found to have elevated LFTs.  He was admitted.  Apparently he has history of hiatal hernia, esophagitis and duodenal ulcer.  He is not clear on this.  His current presentation is not typical pain for him.  This has gradually improved since admission and is now mild. CT scan has shown multiple large gallstones and mildly dilated bile ducts.  MRCP showed mildly dilated bile ducts with no evidence of tumor or stone in the common bile duct.  He said no change in his bowel movements.  He states he has some urinary urgency and frequency on a chronic basis.  Only previous abdominal surgery is inguinal hernia repairs.  He has had benign tumors removed from his abdominal wall.  Since admission EKG and troponins have shown no evidence of cardiac source of his pain.  Past Medical History:  Diagnosis Date  . Acute esophagitis 06/01/2015  . Benign essential HTN 05/31/2015  . Duodenal ulcer hemorrhage   . Esophageal stricture 06/01/2015  . Hiatal hernia 06/01/2015    Past Surgical History:  Procedure Laterality Date  . ESOPHAGOGASTRODUODENOSCOPY N/A 05/31/2015   Procedure: ESOPHAGOGASTRODUODENOSCOPY (EGD);  Surgeon: Ladene Artist, MD;  Location: [facility];  Service: Endoscopy;  Laterality: N/A;  . HERNIA REPAIR  1975  . LEFT ROTATOR CUFF REPAIR  2002  . REPLACEMENT TOTAL  KNEE Left   . TOTAL KNEE ARTHROPLASTY Right 02/22/2015   Procedure: TOTAL KNEE ARTHROPLASTY;  Surgeon: Melrose Nakayama, MD;  Location: [facility];  Service: Orthopedics;  Laterality: Right;    Family History  Problem Relation Age of Onset  . Hypertension Mother   . Heart attack Sister     Social History:  reports that he has been smoking cigarettes.  He has a 25.50 pack-year smoking history. He quit smokeless tobacco use about 2 years ago. He reports that he does not drink alcohol or use drugs.  Allergies:  Allergies  Allergen Reactions  . Asa [Aspirin]     angioedema    Current Facility-Administered Medications  Medication Dose Route Frequency Provider Last Rate Last Dose  . 0.9 %  sodium chloride infusion   Intravenous Continuous Fuller Plan A, MD 150 mL/hr at 09/29/17 0457    . acetaminophen (TYLENOL) suppository 650 mg  650 mg Rectal Q6H PRN Tamala Julian, Rondell A, MD      . albuterol (PROVENTIL) (2.5 MG/3ML) 0.083% nebulizer solution 2.5 mg  2.5 mg Nebulization Q6H PRN Fuller Plan A, MD      . Derrill Memo ON 09/30/2017] ceFAZolin (ANCEF) IVPB 2g/100 mL premix  2 g Intravenous 90 min Pre-Op Excell Seltzer, MD      . enoxaparin (LOVENOX) injection 40 mg  40 mg Subcutaneous QHS Smith, Rondell A, MD   40 mg at 09/28/17 2359  . famotidine (PEPCID) IVPB 20 mg  premix  20 mg Intravenous Q12H Alonza Bogus D, PA-C 100 mL/hr at 09/29/17 1023 20 mg at 09/29/17 1023  . nicotine (NICODERM CQ - dosed in mg/24 hours) patch 21 mg  21 mg Transdermal Daily Fuller Plan A, MD   21 mg at 09/29/17 0907  . ondansetron (ZOFRAN) tablet 4 mg  4 mg Oral Q6H PRN Fuller Plan A, MD       Or  . ondansetron (ZOFRAN) injection 4 mg  4 mg Intravenous Q6H PRN Tamala Julian, Rondell A, MD      . oxyCODONE (Oxy IR/ROXICODONE) immediate release tablet 10 mg  10 mg Oral Q8H PRN Norval Morton, MD   10 mg at 09/29/17 0331     Results for orders placed or performed during the hospital encounter of 09/28/17 (from the past 48  hour(s))  Basic metabolic panel     Status: Abnormal   Collection Time: 09/28/17  3:06 PM  Result Value Ref Range   Sodium 136 135 - 145 mmol/L   Potassium 4.1 3.5 - 5.1 mmol/L   Chloride 96 (L) 101 - 111 mmol/L   CO2 27 22 - 32 mmol/L   Glucose, Bld 120 (H) 65 - 99 mg/dL   BUN 27 (H) 6 - 20 mg/dL   Creatinine, Ser 1.34 (H) 0.61 - 1.24 mg/dL   Calcium 9.9 8.9 - 10.3 mg/dL   GFR calc non Af Amer 50 (L) >60 mL/min   GFR calc Af Amer 58 (L) >60 mL/min    Comment: (NOTE) The eGFR has been calculated using the CKD EPI equation. This calculation has not been validated in all clinical situations. eGFR's persistently <60 mL/min signify possible Chronic Kidney Disease.    Anion gap 13 5 - 15    Comment: Performed at [facility], [place] [address] [place], [place] [address]  CBC     Status: Abnormal   Collection Time: 09/28/17  3:06 PM  Result Value Ref Range   WBC 12.5 (H) 4.0 - 10.5 K/uL   RBC 4.55 4.22 - 5.81 MIL/uL   Hemoglobin 14.4 13.0 - 17.0 g/dL   HCT 41.3 39.0 - 52.0 %   MCV 90.8 78.0 - 100.0 fL   MCH 31.6 26.0 - 34.0 pg   MCHC 34.9 30.0 - 36.0 g/dL   RDW 13.7 11.5 - 15.5 %   Platelets 340 150 - 400 K/uL    Comment: Performed at [facility], [place] [address] [place], [place] [address]  I-stat troponin, ED     Status: None   Collection Time: 09/28/17  3:19 PM  Result Value Ref Range   Troponin i, poc 0.01 0.00 - 0.08 ng/mL   Comment 3            Comment: Due to the release kinetics of cTnI, a negative result within the first hours of the onset of symptoms does not rule out myocardial infarction with certainty. If myocardial infarction is still suspected, repeat the test at appropriate intervals.   Hepatic function panel     Status: Abnormal   Collection Time: 09/28/17  4:54 PM  Result Value Ref Range   Total Protein 8.0 6.5 - 8.1 g/dL   Albumin 4.3 3.5 - 5.0 g/dL   AST 524 (H) 15 - 41 U/L   ALT 365 (H) 17 - 63 U/L   Alkaline  Phosphatase 150 (H) 38 - 126 U/L   Total Bilirubin 2.4 (H) 0.3 - 1.2 mg/dL   Bilirubin, Direct  0.7 (H) 0.1 - 0.5 mg/dL   Indirect Bilirubin 1.7 (H) 0.3 - 0.9 mg/dL    Comment: Performed at [facility], [place] [address], [place], [place] [address]  Lipase, blood     Status: None   Collection Time: 09/28/17  4:54 PM  Result Value Ref Range   Lipase 40 11 - 51 U/L    Comment: Performed at [facility], [place] [address], [place], [place] [address]  Acetaminophen level     Status: Abnormal   Collection Time: 09/28/17  8:22 PM  Result Value Ref Range   Acetaminophen (Tylenol), Serum <10 (L) 10 - 30 ug/mL    Comment: (NOTE) Therapeutic concentrations vary significantly. A range of 10-30 ug/mL  may be an effective concentration for many patients. However, some  are best treated at concentrations outside of this range. Acetaminophen concentrations >150 ug/mL at 4 hours after ingestion  and >50 ug/mL at 12 hours after ingestion are often associated with  toxic reactions. Performed at [facility], [place] [address] [place], [place] [address]   Urinalysis, Routine w reflex microscopic     Status: Abnormal   Collection Time: 09/28/17 10:07 PM  Result Value Ref Range   Color, Urine AMBER (A) YELLOW    Comment: BIOCHEMICALS MAY BE AFFECTED BY COLOR   APPearance CLEAR CLEAR   Specific Gravity, Urine 1.036 (H) 1.005 - 1.030   pH 7.0 5.0 - 8.0   Glucose, UA NEGATIVE NEGATIVE mg/dL   Hgb urine dipstick NEGATIVE NEGATIVE   Bilirubin Urine NEGATIVE NEGATIVE   Ketones, ur NEGATIVE NEGATIVE mg/dL   Protein, ur NEGATIVE NEGATIVE mg/dL   Nitrite NEGATIVE NEGATIVE   Leukocytes, UA NEGATIVE NEGATIVE    Comment: Performed at [facility], [place] [address], [place], [place] [address]  MRSA PCR Screening     Status: None   Collection Time: 09/29/17  1:32 AM  Result Value Ref Range   MRSA by PCR NEGATIVE NEGATIVE    Comment:         The GeneXpert MRSA Assay (FDA approved for NASAL specimens only), is one component of a comprehensive MRSA colonization surveillance program. It is not intended to diagnose MRSA infection nor to guide or monitor treatment for MRSA infections. Performed at [facility], [place] [address] [place], [place] [address]   CBC     Status: Abnormal   Collection Time: 09/29/17  5:30 AM  Result Value Ref Range   WBC 9.4 4.0 - 10.5 K/uL   RBC 4.16 (L) 4.22 - 5.81 MIL/uL   Hemoglobin 13.0 13.0 - 17.0 g/dL   HCT 37.8 (L) 39.0 - 52.0 %   MCV 90.9 78.0 - 100.0 fL   MCH 31.3 26.0 - 34.0 pg   MCHC 34.4 30.0 - 36.0 g/dL   RDW 13.8 11.5 - 15.5 %   Platelets 319 150 - 400 K/uL    Comment: Performed at [facility], [place] [address], [place],  [address]  Comprehensive metabolic panel     Status: Abnormal   Collection Time: 09/29/17  5:30 AM  Result Value Ref Range   Sodium 139 135 - 145 mmol/L   Potassium 3.9 3.5 - 5.1 mmol/L   Chloride 105 101 - 111 mmol/L   CO2 24 22 - 32 mmol/L   Glucose, Bld 108 (H) 65 - 99 mg/dL   BUN 22 (H) 6 - 20 mg/dL   Creatinine, Ser 1.04 0.61 - 1.24 mg/dL  Calcium 9.8 8.9 - 10.3 mg/dL   Total Protein 6.9 6.5 - 8.1 g/dL   Albumin 3.5 3.5 - 5.0 g/dL   AST 447 (H) 15 - 41 U/L   ALT 592 (H) 17 - 63 U/L   Alkaline Phosphatase 228 (H) 38 - 126 U/L   Total Bilirubin 3.7 (H) 0.3 - 1.2 mg/dL   GFR calc non Af Amer >60 >60 mL/min   GFR calc Af Amer >60 >60 mL/min    Comment: (NOTE) The eGFR has been calculated using the CKD EPI equation. This calculation has not been validated in all clinical situations. eGFR's persistently <60 mL/min signify possible Chronic Kidney Disease.    Anion gap 10 5 - 15    Comment: Performed at [facility], [place] [address], [place], [place] [address]  Sedimentation rate     Status: Abnormal   Collection Time: 09/29/17  5:30 AM  Result Value Ref Range   Sed Rate 22 (H) 0 - 16  mm/hr    Comment: Performed at [facility], [place] [address], [place], [place] [address]    Ct Abdomen Pelvis W Contrast  Result Date: 09/28/2017 CLINICAL DATA:  Patient does have history of ulcer disease and acute esophagitis. He says this feels different than that. He reports about 4 AM he woke up and that pain is been continued since then. Patient took his chronic pain medication at home and did not get better. On arrival patient had a 10 out of 10 pain, however it is since feels better after lying still EXAM: CT ABDOMEN AND PELVIS WITH CONTRAST TECHNIQUE: Multidetector CT imaging of the abdomen and pelvis was performed using the standard protocol following bolus administration of intravenous contrast. CONTRAST:  <See Chart> ISOVUE-300 IOPAMIDOL (ISOVUE-300) INJECTION 61% COMPARISON:  None. FINDINGS: Lower chest: No acute abnormality. Hepatobiliary: Liver normal in size and attenuation. No mass or focal lesions. There is mild intra and extrahepatic bile duct dilation. Common bile duct measures 1 cm proximally, but shows normal distal tapering. No evidence of a duct stone. Gallbladder is moderately distended. There are gallstones. No wall thickening. No adjacent inflammation. Pancreas: Unremarkable. No pancreatic ductal dilatation or surrounding inflammatory changes. Spleen: Normal in size without focal abnormality. Adrenals/Urinary Tract: No adrenal masses. Multiple bilateral low-density nonenhancing renal masses, largest arising from the anteromedial mid to lower pole of the left kidney measuring 5.1 cm. These are all consistent with cysts. No renal stones. No hydronephrosis. Ureters are normal course and in caliber. Bladder is unremarkable. Stomach/Bowel: Stomach is unremarkable. Small bowel is normal caliber with no wall thickening or inflammation. There are numerous colonic diverticula. No evidence of diverticulitis or other colon inflammatory process. Appendix is normal.  Vascular/Lymphatic: There are several prominent to mildly enlarged gastrohepatic ligament lymph nodes, largest measuring 11 mm in short axis. No other adenopathy. Atherosclerotic disease noted along the abdominal aorta and iliac vessels. No aneurysm. Reproductive: Prostate is mildly enlarged measuring 4.8 cm maximum transverse dimension. Other: No abdominal wall hernia or abnormality. No abdominopelvic ascites. Musculoskeletal: No fracture or acute finding. No osteoblastic or osteolytic lesions. There are significant degenerative changes of the mid to lower lumbar spine. IMPRESSION: 1. No acute findings within the abdomen or pelvis. 2. Extensive colonic diverticulosis with no evidence of diverticulitis. 3. Mild intra and extrahepatic bile duct dilation. No evidence bile duct obstruction. This is most likely chronic. 4. Gallstones.  No acute cholecystitis. 5. Multiple renal cysts. 6. Aortic atherosclerosis. Electronically Signed   By: Shanon Brow  Ormond M.D.   On: 09/28/2017 17:29   Mr 3d Recon At Scanner  Result Date: 09/28/2017 CLINICAL DATA:  Abdominal pain. Abnormal liver function studies. Epigastric pain and vomiting. EXAM: MRI ABDOMEN WITHOUT AND WITH CONTRAST (INCLUDING MRCP) TECHNIQUE: Multiplanar multisequence MR imaging of the abdomen was performed both before and after the administration of intravenous contrast. Heavily T2-weighted images of the biliary and pancreatic ducts were obtained, and three-dimensional MRCP images were rendered by post processing. CONTRAST:  53m MULTIHANCE GADOBENATE DIMEGLUMINE 529 MG/ML IV SOLN COMPARISON:  CT abdomen and pelvis 09/28/2017 FINDINGS: Examination is technically limited due to motion artifact. Many sequences are nondiagnostic due to motion artifact. Lower chest: No specific acute abnormality identified. Hepatobiliary: Cholelithiasis with multiple large stones in the gallbladder. No inflammatory changes to suggest cholecystitis. Mild bile duct dilatation is  demonstrated. No obstructing mass lesion or stone is identified. No focal liver lesions. Pancreas: No mass, inflammatory changes, or other parenchymal abnormality identified. No pancreatic ductal dilatation. Spleen:  Within normal limits in size and appearance. Adrenals/Urinary Tract: No adrenal gland nodules. Multiple bilateral renal parenchymal cysts. No hydronephrosis or solid mass identified. Stomach/Bowel: Visualized stomach, small bowel, and colon are not abnormally distended and no wall thickening is appreciated. Vascular/Lymphatic: Normal caliber abdominal aorta. No significant lymphadenopathy. Other:  None. Musculoskeletal: No suspicious bone lesions identified. IMPRESSION: 1. Cholelithiasis with large stones in the gallbladder. No evidence of cholecystitis. 2. Mild bile duct dilatation.  No cause identified. 3. Bilateral renal cysts.  No hydronephrosis. 4. Examination is technically limited due to motion artifact. Some sequences are nondiagnostic. Electronically Signed   By: WLucienne CapersM.D.   On: 09/28/2017 23:32   Dg Chest Portable 1 View  Result Date: 09/28/2017 CLINICAL DATA:  Chest pain, shortness of breath and chest tightness. EXAM: PORTABLE CHEST 1 VIEW COMPARISON:  03/12/2017 FINDINGS: Cardiomediastinal silhouette is normal. Mediastinal contours appear intact. Tortuosity of the aorta. There is no evidence of focal airspace consolidation, pleural effusion or pneumothorax. Osseous structures are without acute abnormality. Soft tissues are grossly normal. IMPRESSION: No active disease. Electronically Signed   By: DFidela SalisburyM.D.   On: 09/28/2017 15:39   Mr Abdomen Mrcp WMoise BoringContast  Result Date: 09/28/2017 CLINICAL DATA:  Abdominal pain. Abnormal liver function studies. Epigastric pain and vomiting. EXAM: MRI ABDOMEN WITHOUT AND WITH CONTRAST (INCLUDING MRCP) TECHNIQUE: Multiplanar multisequence MR imaging of the abdomen was performed both before and after the administration of  intravenous contrast. Heavily T2-weighted images of the biliary and pancreatic ducts were obtained, and three-dimensional MRCP images were rendered by post processing. CONTRAST:  29mMULTIHANCE GADOBENATE DIMEGLUMINE 529 MG/ML IV SOLN COMPARISON:  CT abdomen and pelvis 09/28/2017 FINDINGS: Examination is technically limited due to motion artifact. Many sequences are nondiagnostic due to motion artifact. Lower chest: No specific acute abnormality identified. Hepatobiliary: Cholelithiasis with multiple large stones in the gallbladder. No inflammatory changes to suggest cholecystitis. Mild bile duct dilatation is demonstrated. No obstructing mass lesion or stone is identified. No focal liver lesions. Pancreas: No mass, inflammatory changes, or other parenchymal abnormality identified. No pancreatic ductal dilatation. Spleen:  Within normal limits in size and appearance. Adrenals/Urinary Tract: No adrenal gland nodules. Multiple bilateral renal parenchymal cysts. No hydronephrosis or solid mass identified. Stomach/Bowel: Visualized stomach, small bowel, and colon are not abnormally distended and no wall thickening is appreciated. Vascular/Lymphatic: Normal caliber abdominal aorta. No significant lymphadenopathy. Other:  None. Musculoskeletal: No suspicious bone lesions identified. IMPRESSION: 1. Cholelithiasis with large stones in the gallbladder. No  evidence of cholecystitis. 2. Mild bile duct dilatation.  No cause identified. 3. Bilateral renal cysts.  No hydronephrosis. 4. Examination is technically limited due to motion artifact. Some sequences are nondiagnostic. Electronically Signed   By: Lucienne Capers M.D.   On: 09/28/2017 23:32   US Abdomen Limited Ruq  Result Date: 09/28/2017 CLINICAL DATA:  Right upper quadrant abdominal pain beginning this morning. EXAM: ULTRASOUND ABDOMEN LIMITED RIGHT UPPER QUADRANT COMPARISON:  Current abdomen pelvis CT FINDINGS: Gallbladder: Gallstones, largest measuring just  under 3 cm. Stones cause posterior acoustic shadowing somewhat limiting evaluation of the gallbladder. No gallbladder wall thickening or pericholecystic fluid. There is some sludge. Common bile duct: Diameter: 10 mm.  No sonographic evidence of a duct stone. Liver: Normal size and parenchymal echogenicity. No mass or focal lesion. Mild central intrahepatic bile duct dilation. Portal vein is patent on color Doppler imaging with normal direction of blood flow towards the liver. IMPRESSION: 1. Gallstones without evidence of acute cholecystitis. 2. Mild intra and extrahepatic bile duct dilation as noted on the current CT. No sonographic evidence of a duct stone. Electronically Signed   By: Lajean Manes M.D.   On: 09/28/2017 19:04    Review of Systems  Constitutional: Negative for chills and fever.  Respiratory: Negative.   Cardiovascular: Positive for chest pain. Negative for palpitations and leg swelling.  Gastrointestinal: Positive for abdominal pain, heartburn, nausea and vomiting. Negative for constipation and diarrhea.  Genitourinary: Positive for frequency.  Musculoskeletal: Positive for back pain, joint pain and myalgias.   Blood pressure 102/60, pulse 74, temperature 98.5 F (36.9 C), resp. rate 20, height '5\' 9"'  (1.753 m), weight 87.5 kg (192 lb 14.4 oz), SpO2 96 %. Physical Exam General: Alert, well-developed pleasant elderly African-American male, in no distress Skin: Warm and dry without rash or infection. HEENT: No palpable masses or thyromegaly. Sclera nonicteric.  Lymph nodes: No cervical, supraclavicular, or inguinal nodes palpable. Lungs: Breath sounds distant but clear and equal without increased work of breathing Cardiovascular: Regular rate and rhythm without murmur. No JVD or edema.Abdomen: Nondistended. Soft and nontender. No masses palpable. No organomegaly. No palpable hernias. Extremities: No edema or joint swelling or deformity.  Healed incisions both knees.  No chronic  venous stasis changes. Neurologic: Alert and fully oriented.  Affect normal.  No gross motor deficits.  Assessment/Plan: Acute severe abdominal and chest pain onset yesterday a little atypical but certainly consistent with severe biliary colic.  Pain is essentially gone today.  He has multiple large gallstones.  LFTs are significantly elevated.  MRCP did not show common bile duct stone but LFTs are actually higher today than yesterday.  This very likely is biliary colic, possible passed common bile duct stone. I think laparoscopic cholecystectomy is indicated to prevent further attacks and complications from his gallstones.  I will plan to schedule for tomorrow morning.  I would like to repeat his LFTs tomorrow preoperatively.  If they continue to rise I would discuss with GI possible preoperative ERCP.  This was all discussed with the patient.I discussed the procedure in detail.  The patient was given Neurosurgeon.  We discussed the risks and benefits of a laparoscopic cholecystectomy and  cholangiogram including, but not limited to bleeding, infection, injury to surrounding structures such as the intestine or liver, bile leak, retained gallstones, need to convert to an open procedure, prolonged diarrhea, blood clots such as  DVT, common bile duct injury, anesthesia risks, and possible need for additional procedures.  The likelihood of  improvement in symptoms and return to the patient's normal status is good. We discussed the typical post-operative recovery course.  Darene Lamer Caryle Helgeson 09/29/2017, 10:35 AM

## 2017-09-30 ENCOUNTER — Inpatient Hospital Stay (HOSPITAL_COMMUNITY): Payer: Medicare Other | Admitting: Anesthesiology

## 2017-09-30 ENCOUNTER — Encounter (HOSPITAL_COMMUNITY): Payer: Self-pay | Admitting: Certified Registered Nurse Anesthetist

## 2017-09-30 ENCOUNTER — Inpatient Hospital Stay (HOSPITAL_COMMUNITY): Payer: Medicare Other

## 2017-09-30 ENCOUNTER — Encounter (HOSPITAL_COMMUNITY)
Admission: EM | Disposition: A | Discharge status: Home Health | Payer: Self-pay | Source: Home / Self Care | Attending: Internal Medicine

## 2017-09-30 DIAGNOSIS — D509 Iron deficiency anemia, unspecified: Secondary | ICD-10-CM | POA: Diagnosis not present

## 2017-09-30 DIAGNOSIS — K449 Diaphragmatic hernia without obstruction or gangrene: Secondary | ICD-10-CM | POA: Diagnosis not present

## 2017-09-30 DIAGNOSIS — I10 Essential (primary) hypertension: Secondary | ICD-10-CM | POA: Diagnosis not present

## 2017-09-30 DIAGNOSIS — K8010 Calculus of gallbladder with chronic cholecystitis without obstruction: Secondary | ICD-10-CM | POA: Diagnosis not present

## 2017-09-30 HISTORY — PX: CHOLECYSTECTOMY: SHX55

## 2017-09-30 LAB — COMPREHENSIVE METABOLIC PANEL
ALT: 471 U/L — ABNORMAL HIGH (ref 17–63)
AST: 196 U/L — ABNORMAL HIGH (ref 15–41)
Albumin: 3.8 g/dL (ref 3.5–5.0)
Alkaline Phosphatase: 268 U/L — ABNORMAL HIGH (ref 38–126)
Anion gap: 12 (ref 5–15)
BUN: 18 mg/dL (ref 6–20)
CO2: 22 mmol/L (ref 22–32)
Calcium: 9.2 mg/dL (ref 8.9–10.3)
Chloride: 104 mmol/L (ref 101–111)
Creatinine, Ser: 1.05 mg/dL (ref 0.61–1.24)
GFR calc Af Amer: >60 mL/min (ref >60)
GFR calc non Af Amer: >60 mL/min (ref >60)
Glucose, Bld: 88 mg/dL (ref 65–99)
Potassium: 3.8 mmol/L (ref 3.5–5.1)
Sodium: 138 mmol/L (ref 135–145)
Total Bilirubin: 2.3 mg/dL — ABNORMAL HIGH (ref 0.3–1.2)
Total Protein: 7.4 g/dL (ref 6.5–8.1)

## 2017-09-30 LAB — HEPATITIS PANEL, ACUTE
HCV Ab: <0.1 s/co ratio (ref 0.0–0.9)
Hep A IgM: NEGATIVE
Hep B C IgM: NEGATIVE
Hepatitis B Surface Ag: NEGATIVE

## 2017-09-30 SURGERY — LAPAROSCOPIC CHOLECYSTECTOMY WITH INTRAOPERATIVE CHOLANGIOGRAM
Anesthesia: General | Site: Abdomen

## 2017-09-30 MED ORDER — PROPOFOL 10 MG/ML IV BOLUS
INTRAVENOUS | Status: DC | PRN
  Administered 2017-09-30: 150 mg via INTRAVENOUS

## 2017-09-30 MED ORDER — IOPAMIDOL (ISOVUE-300) INJECTION 61%
INTRAVENOUS | Status: DC | PRN
  Administered 2017-09-30: 8 mL

## 2017-09-30 MED ORDER — METOCLOPRAMIDE HCL 5 MG/ML IJ SOLN: 10.0000 mg | Freq: Once | INTRAMUSCULAR | Status: DC | PRN

## 2017-09-30 MED ORDER — ONDANSETRON HCL 4 MG/2ML IJ SOLN
INTRAMUSCULAR | Status: DC | PRN
  Administered 2017-09-30: 4 mg via INTRAVENOUS

## 2017-09-30 MED ORDER — LACTATED RINGERS IR SOLN
Status: DC | PRN
  Administered 2017-09-30: 1000 mL

## 2017-09-30 MED ORDER — SUCCINYLCHOLINE CHLORIDE 200 MG/10ML IV SOSY
PREFILLED_SYRINGE | INTRAVENOUS | Status: DC | PRN
  Administered 2017-09-30: 120 mg via INTRAVENOUS

## 2017-09-30 MED ORDER — IOPAMIDOL (ISOVUE-300) INJECTION 61%
INTRAVENOUS | Status: AC
  Filled 2017-09-30: qty 50

## 2017-09-30 MED ORDER — LACTATED RINGERS IV SOLN
INTRAVENOUS | Status: DC | PRN
  Administered 2017-09-30: 09:00:00 via INTRAVENOUS

## 2017-09-30 MED ORDER — BUPIVACAINE-EPINEPHRINE 0.25% -1:200000 IJ SOLN
INTRAMUSCULAR | Status: DC | PRN
  Administered 2017-09-30: 30 mL

## 2017-09-30 MED ORDER — LIDOCAINE 2% (20 MG/ML) 5 ML SYRINGE
INTRAMUSCULAR | Status: DC | PRN
  Administered 2017-09-30: 100 mg via INTRAVENOUS

## 2017-09-30 MED ORDER — CEFAZOLIN SODIUM-DEXTROSE 2-3 GM-%(50ML) IV SOLR
INTRAVENOUS | Status: DC | PRN
  Administered 2017-09-30: 2 g via INTRAVENOUS

## 2017-09-30 MED ORDER — SUGAMMADEX SODIUM 200 MG/2ML IV SOLN
INTRAVENOUS | Status: AC
  Filled 2017-09-30: qty 2

## 2017-09-30 MED ORDER — FENTANYL CITRATE (PF) 100 MCG/2ML IJ SOLN: 25.0000 ug | INTRAMUSCULAR | Status: DC | PRN

## 2017-09-30 MED ORDER — ACETAMINOPHEN 325 MG PO TABS
650.0000 mg | ORAL_TABLET | Freq: Four times a day (QID) | ORAL | Status: DC | PRN
  Administered 2017-09-30 – 2017-10-04 (×8): 650 mg via ORAL
  Filled 2017-09-30 (×9): qty 2

## 2017-09-30 MED ORDER — BUPIVACAINE-EPINEPHRINE (PF) 0.25% -1:200000 IJ SOLN
INTRAMUSCULAR | Status: AC
  Filled 2017-09-30: qty 30

## 2017-09-30 MED ORDER — ROCURONIUM BROMIDE 10 MG/ML (PF) SYRINGE
PREFILLED_SYRINGE | INTRAVENOUS | Status: DC | PRN
  Administered 2017-09-30: 40 mg via INTRAVENOUS

## 2017-09-30 MED ORDER — FENTANYL CITRATE (PF) 100 MCG/2ML IJ SOLN
INTRAMUSCULAR | Status: AC
  Filled 2017-09-30: qty 2

## 2017-09-30 MED ORDER — SUGAMMADEX SODIUM 200 MG/2ML IV SOLN
INTRAVENOUS | Status: DC | PRN
  Administered 2017-09-30: 200 mg via INTRAVENOUS

## 2017-09-30 MED ORDER — 0.9 % SODIUM CHLORIDE (POUR BTL) OPTIME
TOPICAL | Status: DC | PRN
  Administered 2017-09-30: 1000 mL

## 2017-09-30 MED ORDER — MEPERIDINE HCL 50 MG/ML IJ SOLN: 6.2500 mg | INTRAMUSCULAR | Status: DC | PRN

## 2017-09-30 MED ORDER — PHENYLEPHRINE 40 MCG/ML (10ML) SYRINGE FOR IV PUSH (FOR BLOOD PRESSURE SUPPORT)
PREFILLED_SYRINGE | INTRAVENOUS | Status: DC | PRN
  Administered 2017-09-30: 80 ug via INTRAVENOUS

## 2017-09-30 MED ORDER — FENTANYL CITRATE (PF) 100 MCG/2ML IJ SOLN
INTRAMUSCULAR | Status: DC | PRN
  Administered 2017-09-30 (×2): 50 ug via INTRAVENOUS
  Administered 2017-09-30: 100 ug via INTRAVENOUS
  Administered 2017-09-30 (×2): 50 ug via INTRAVENOUS

## 2017-09-30 SURGICAL SUPPLY — 35 items
APPLICATOR ARISTA FLEXITIP XL (MISCELLANEOUS) ×1 IMPLANT
APPLIER CLIP ROT 10 11.4 M/L (STAPLE) ×2
CABLE HIGH FREQUENCY MONO STRZ (ELECTRODE) ×2 IMPLANT
CATH REDDICK CHOLANGI 4FR 50CM (CATHETERS) IMPLANT
CHLORAPREP W/TINT 26ML (MISCELLANEOUS) ×2 IMPLANT
CLIP APPLIE ROT 10 11.4 M/L (STAPLE) ×1 IMPLANT
COVER MAYO STAND STRL (DRAPES) ×2 IMPLANT
COVER SURGICAL LIGHT HANDLE (MISCELLANEOUS) ×2 IMPLANT
DECANTER SPIKE VIAL GLASS SM (MISCELLANEOUS) ×2 IMPLANT
DERMABOND ADVANCED (GAUZE/BANDAGES/DRESSINGS) ×1
DERMABOND ADVANCED .7 DNX12 (GAUZE/BANDAGES/DRESSINGS) ×1 IMPLANT
DRAPE C-ARM 42X120 X-RAY (DRAPES) ×2 IMPLANT
ELECT REM PT RETURN 15FT ADLT (MISCELLANEOUS) ×2 IMPLANT
GLOVE BIOGEL PI IND STRL 7.5 (GLOVE) ×1 IMPLANT
GLOVE BIOGEL PI INDICATOR 7.5 (GLOVE) ×1
GLOVE ECLIPSE 7.5 STRL STRAW (GLOVE) ×2 IMPLANT
GOWN STRL REUS W/TWL XL LVL3 (GOWN DISPOSABLE) ×6 IMPLANT
HEMOSTAT ARISTA ABSORB 3G PWDR (MISCELLANEOUS) ×1 IMPLANT
HEMOSTAT SNOW SURGICEL 2X4 (HEMOSTASIS) IMPLANT
HEMOSTAT SURGICEL 4X8 (HEMOSTASIS) IMPLANT
KIT BASIN OR (CUSTOM PROCEDURE TRAY) ×2 IMPLANT
POUCH RETRIEVAL ECOSAC 10 (ENDOMECHANICALS) IMPLANT
POUCH RETRIEVAL ECOSAC 10MM (ENDOMECHANICALS)
SCISSORS LAP 5X35 DISP (ENDOMECHANICALS) ×2 IMPLANT
SET CHOLANGIOGRAPH MIX (MISCELLANEOUS) ×2 IMPLANT
SET IRRIG TUBING LAPAROSCOPIC (IRRIGATION / IRRIGATOR) ×2 IMPLANT
SLEEVE XCEL OPT CAN 5 100 (ENDOMECHANICALS) ×2 IMPLANT
SUT MNCRL AB 4-0 PS2 18 (SUTURE) ×2 IMPLANT
SUT VICRYL 0 UR6 27IN ABS (SUTURE) ×1 IMPLANT
TOWEL OR 17X26 10 PK STRL BLUE (TOWEL DISPOSABLE) ×2 IMPLANT
TRAY LAPAROSCOPIC (CUSTOM PROCEDURE TRAY) ×2 IMPLANT
TROCAR BLADELESS OPT 5 100 (ENDOMECHANICALS) ×2 IMPLANT
TROCAR XCEL BLUNT TIP 100MML (ENDOMECHANICALS) ×2 IMPLANT
TROCAR XCEL NON-BLD 11X100MML (ENDOMECHANICALS) ×2 IMPLANT
TUBING INSUF HEATED (TUBING) ×2 IMPLANT

## 2017-09-30 NOTE — Progress Notes (Signed)
PROGRESS NOTE    Adrian Miller  OZH:[id number] DOB: [birth year] DOA: 09/28/2017 PCP: Janith Lima, MD   Brief Narrative: Patient is a 77 year old male with past medical history of hypertension, hyperlipidemia, diastolic dysfunction, and right upper quadrant pain.  Duodenal ulcer, esophagitis who presented to the emergency department with complaints of chest pain and right upper quadrant pain.  On presentation, his liver enzymes were found to be elevated.  CT imaging showed intrahepatic dilatation and cholelithiasis without signs of cholecystitis.  MRCP showed the same finding.  GI and general surgery following.  He symptoms were suggestive of biliary colic and he underwent laparoscopic cholecystectomy on 09/30/17.  Assessment & Plan:   Principal Problem:   Abdominal pain Active Problems:   Leukocytosis   Benign essential HTN   GERD (gastroesophageal reflux disease)   Cholelithiasis   Elevated liver enzymes   Renal insufficiency   Right upper quadrant pain/elevated liver enzymes: CT imaging showed intra-and extrahepatic duct dilatation and cholelithiasis without cholecystitis.  MRCP showed same.  General surgery was consulted.  His symptoms were suggestive of  biliary colic.  Underwent  laparoscopic cholecystectomy with intraoperative cholangiogram which did not show filling  defects. Liver enzymes improving.  Chest pain: Resolved.Atypical chest pain.  His symptoms are consistent with GERD or esophagitis.  Continue PPI.  Troponins are negative and EKG did not show any ischemic changes.  Pain improved with GI cocktail.  Leukocytosis: Resolved.  Chest x-ray did not show any acute infiltrate.    History of acute kidney injury: Resolved with IV fluids.  Heart failure with preserved ejection fraction: Echocardiogram as per 1/17 showed ejection fraction of 60 to 65% with grade 2 diastolic dysfunction.  He is euvolemic at present.  History of essential hypertension: Currently blood pressure  stable.  Continue to monitor.  Home medications on hold.  Chronic pain syndrome: Continue his home medications.  Tobacco abuse: Continue nicotine patch.       DVT prophylaxis: Lovenox Code Status: Full Family Communication: No family present at the bedside Disposition Plan: Likely home tomorrow after surgical clearance   Consultants: GI, surgery  Procedures: None  Antimicrobials: None  Subjective: Patient seen and examined the pressure this morning.  Underwent laparoscopic colostomy today.  Hemodynamically stable.  Objective: Vitals:   09/30/17 1045 09/30/17 1100 09/30/17 1115 09/30/17 1127  BP: 140/67 (!) 142/68 (!) 145/82 139/75  Pulse: 69 70 66 65  Resp: 12 14 16 12   Temp:   98.4 F (36.9 C) 98.1 F (36.7 C)  TempSrc:      SpO2: 100% 96% 95% 94%  Weight:      Height:        Intake/Output Summary (Last 24 hours) at 09/30/2017 1305 Last data filed at 09/30/2017 1140 Gross per 24 hour  Intake 3623.75 ml  Output 1265 ml  Net 2358.75 ml   Filed Weights   09/28/17 2344 09/29/17 0442 09/30/17 0519  Weight: 87 kg (191 lb 12.8 oz) 87.5 kg (192 lb 14.4 oz) 88.7 kg (195 lb 8.8 oz)    Examination:  General exam: Appears calm and comfortable ,Not in distress,average built HEENT:PERRL,Oral mucosa moist, Ear/Nose normal on gross exam Respiratory system: Bilateral equal air entry, normal vesicular breath sounds, no wheezes or crackles  Cardiovascular system: S1 & S2 heard, RRR. No JVD, murmurs, rubs, gallops or clicks. No pedal edema. Gastrointestinal system: Abdomen is nondistended, soft .mild right upper quadrant tenderness.  No organomegaly or masses felt. Normal bowel sounds heard. Surgical scars from laparoscopic  cholecystectomy. Central nervous system: Alert and oriented. No focal neurological deficits. Extremities: No edema, no clubbing ,no cyanosis, distal peripheral pulses palpable. Skin: No rashes, lesions or ulcers,no icterus ,no pallor MSK: Normal muscle  bulk,tone ,power Psychiatry: Judgement and insight appear normal. Mood & affect appropriate.     Data Reviewed: I have personally reviewed following labs and imaging studies  CBC: Recent Labs  Lab 09/28/17 1506 09/29/17 0530  WBC 12.5* 9.4  HGB 14.4 13.0  HCT 41.3 37.8*  MCV 90.8 90.9  PLT 340 939   Basic Metabolic Panel: Recent Labs  Lab 09/28/17 1506 09/29/17 0530 09/30/17 0505  NA 136 139 138  K 4.1 3.9 3.8  CL 96* 105 104  CO2 27 24 22   GLUCOSE 120* 108* 88  BUN 27* 22* 18  CREATININE 1.34* 1.04 1.05  CALCIUM 9.9 9.8 9.2   GFR: Estimated Creatinine Clearance: 65.9 mL/min (by C-G formula based on SCr of 1.05 mg/dL). Liver Function Tests: Recent Labs  Lab 09/28/17 1654 09/29/17 0530 09/30/17 0505  AST 524* 447* 196*  ALT 365* 592* 471*  ALKPHOS 150* 228* 268*  BILITOT 2.4* 3.7* 2.3*  PROT 8.0 6.9 7.4  ALBUMIN 4.3 3.5 3.8   Recent Labs  Lab 09/28/17 1654  LIPASE 40   No results for input(s): AMMONIA in the last 168 hours. Coagulation Profile: No results for input(s): INR, PROTIME in the last 168 hours. Cardiac Enzymes: No results for input(s): CKTOTAL, CKMB, CKMBINDEX, TROPONINI in the last 168 hours. BNP (last 3 results) No results for input(s): PROBNP in the last 8760 hours. HbA1C: No results for input(s): HGBA1C in the last 72 hours. CBG: No results for input(s): GLUCAP in the last 168 hours. Lipid Profile: No results for input(s): CHOL, HDL, LDLCALC, TRIG, CHOLHDL, LDLDIRECT in the last 72 hours. Thyroid Function Tests: No results for input(s): TSH, T4TOTAL, FREET4, T3FREE, THYROIDAB in the last 72 hours. Anemia Panel: No results for input(s): VITAMINB12, FOLATE, FERRITIN, TIBC, IRON, RETICCTPCT in the last 72 hours. Sepsis Labs: No results for input(s): PROCALCITON, LATICACIDVEN in the last 168 hours.  Recent Results (from the past 240 hour(s))  MRSA PCR Screening     Status: None   Collection Time: 09/29/17  1:32 AM  Result Value Ref  Range Status   MRSA by PCR NEGATIVE NEGATIVE Final    Comment:        The GeneXpert MRSA Assay (FDA approved for NASAL specimens only), is one component of a comprehensive MRSA colonization surveillance program. It is not intended to diagnose MRSA infection nor to guide or monitor treatment for MRSA infections. Performed at [facility], [place] [address] [place], [place] [address]   Surgical PCR screen     Status: None   Collection Time: 09/29/17 10:21 PM  Result Value Ref Range Status   MRSA, PCR NEGATIVE NEGATIVE Final   Staphylococcus aureus NEGATIVE NEGATIVE Final    Comment: (NOTE) The Xpert SA Assay (FDA approved for NASAL specimens in patients 28 years of age and older), is one component of a comprehensive surveillance program. It is not intended to diagnose infection nor to guide or monitor treatment. Performed at [facility], [place] [address], [place], [place] [address]          Radiology Studies: Dg Cholangiogram Operative  Result Date: 09/30/2017 CLINICAL DATA:  Gallstones EXAM: INTRAOPERATIVE CHOLANGIOGRAM TECHNIQUE: Cholangiographic images from the C-arm fluoroscopic device were submitted for interpretation post-operatively. Please see the procedural report for the amount  of contrast and the fluoroscopy time utilized. COMPARISON:  None. FINDINGS: Contrast fills the biliary tree and duodenum without filling defects in the common bile duct. IMPRESSION: Patent biliary tree. Electronically Signed   By: Marybelle Killings M.D.   On: 09/30/2017 10:03   Ct Abdomen Pelvis W Contrast  Result Date: 09/28/2017 CLINICAL DATA:  Patient does have history of ulcer disease and acute esophagitis. He says this feels different than that. He reports about 4 AM he woke up and that pain is been continued since then. Patient took his chronic pain medication at home and did not get better. On arrival patient had a 10 out of 10 pain, however it is since feels  better after lying still EXAM: CT ABDOMEN AND PELVIS WITH CONTRAST TECHNIQUE: Multidetector CT imaging of the abdomen and pelvis was performed using the standard protocol following bolus administration of intravenous contrast. CONTRAST:  <See Chart> ISOVUE-300 IOPAMIDOL (ISOVUE-300) INJECTION 61% COMPARISON:  None. FINDINGS: Lower chest: No acute abnormality. Hepatobiliary: Liver normal in size and attenuation. No mass or focal lesions. There is mild intra and extrahepatic bile duct dilation. Common bile duct measures 1 cm proximally, but shows normal distal tapering. No evidence of a duct stone. Gallbladder is moderately distended. There are gallstones. No wall thickening. No adjacent inflammation. Pancreas: Unremarkable. No pancreatic ductal dilatation or surrounding inflammatory changes. Spleen: Normal in size without focal abnormality. Adrenals/Urinary Tract: No adrenal masses. Multiple bilateral low-density nonenhancing renal masses, largest arising from the anteromedial mid to lower pole of the left kidney measuring 5.1 cm. These are all consistent with cysts. No renal stones. No hydronephrosis. Ureters are normal course and in caliber. Bladder is unremarkable. Stomach/Bowel: Stomach is unremarkable. Small bowel is normal caliber with no wall thickening or inflammation. There are numerous colonic diverticula. No evidence of diverticulitis or other colon inflammatory process. Appendix is normal. Vascular/Lymphatic: There are several prominent to mildly enlarged gastrohepatic ligament lymph nodes, largest measuring 11 mm in short axis. No other adenopathy. Atherosclerotic disease noted along the abdominal aorta and iliac vessels. No aneurysm. Reproductive: Prostate is mildly enlarged measuring 4.8 cm maximum transverse dimension. Other: No abdominal wall hernia or abnormality. No abdominopelvic ascites. Musculoskeletal: No fracture or acute finding. No osteoblastic or osteolytic lesions. There are significant  degenerative changes of the mid to lower lumbar spine. IMPRESSION: 1. No acute findings within the abdomen or pelvis. 2. Extensive colonic diverticulosis with no evidence of diverticulitis. 3. Mild intra and extrahepatic bile duct dilation. No evidence bile duct obstruction. This is most likely chronic. 4. Gallstones.  No acute cholecystitis. 5. Multiple renal cysts. 6. Aortic atherosclerosis. Electronically Signed   By: Lajean Manes M.D.   On: 09/28/2017 17:29   Mr 3d Recon At Scanner  Result Date: 09/28/2017 CLINICAL DATA:  Abdominal pain. Abnormal liver function studies. Epigastric pain and vomiting. EXAM: MRI ABDOMEN WITHOUT AND WITH CONTRAST (INCLUDING MRCP) TECHNIQUE: Multiplanar multisequence MR imaging of the abdomen was performed both before and after the administration of intravenous contrast. Heavily T2-weighted images of the biliary and pancreatic ducts were obtained, and three-dimensional MRCP images were rendered by post processing. CONTRAST:  96mL MULTIHANCE GADOBENATE DIMEGLUMINE 529 MG/ML IV SOLN COMPARISON:  CT abdomen and pelvis 09/28/2017 FINDINGS: Examination is technically limited due to motion artifact. Many sequences are nondiagnostic due to motion artifact. Lower chest: No specific acute abnormality identified. Hepatobiliary: Cholelithiasis with multiple large stones in the gallbladder. No inflammatory changes to suggest cholecystitis. Mild bile duct dilatation is demonstrated. No obstructing mass lesion  or stone is identified. No focal liver lesions. Pancreas: No mass, inflammatory changes, or other parenchymal abnormality identified. No pancreatic ductal dilatation. Spleen:  Within normal limits in size and appearance. Adrenals/Urinary Tract: No adrenal gland nodules. Multiple bilateral renal parenchymal cysts. No hydronephrosis or solid mass identified. Stomach/Bowel: Visualized stomach, small bowel, and colon are not abnormally distended and no wall thickening is appreciated.  Vascular/Lymphatic: Normal caliber abdominal aorta. No significant lymphadenopathy. Other:  None. Musculoskeletal: No suspicious bone lesions identified. IMPRESSION: 1. Cholelithiasis with large stones in the gallbladder. No evidence of cholecystitis. 2. Mild bile duct dilatation.  No cause identified. 3. Bilateral renal cysts.  No hydronephrosis. 4. Examination is technically limited due to motion artifact. Some sequences are nondiagnostic. Electronically Signed   By: Lucienne Capers M.D.   On: 09/28/2017 23:32   Dg Chest Portable 1 View  Result Date: 09/28/2017 CLINICAL DATA:  Chest pain, shortness of breath and chest tightness. EXAM: PORTABLE CHEST 1 VIEW COMPARISON:  03/12/2017 FINDINGS: Cardiomediastinal silhouette is normal. Mediastinal contours appear intact. Tortuosity of the aorta. There is no evidence of focal airspace consolidation, pleural effusion or pneumothorax. Osseous structures are without acute abnormality. Soft tissues are grossly normal. IMPRESSION: No active disease. Electronically Signed   By: Fidela Salisbury M.D.   On: 09/28/2017 15:39   Mr Abdomen Mrcp Moise Boring Contast  Result Date: 09/28/2017 CLINICAL DATA:  Abdominal pain. Abnormal liver function studies. Epigastric pain and vomiting. EXAM: MRI ABDOMEN WITHOUT AND WITH CONTRAST (INCLUDING MRCP) TECHNIQUE: Multiplanar multisequence MR imaging of the abdomen was performed both before and after the administration of intravenous contrast. Heavily T2-weighted images of the biliary and pancreatic ducts were obtained, and three-dimensional MRCP images were rendered by post processing. CONTRAST:  84mL MULTIHANCE GADOBENATE DIMEGLUMINE 529 MG/ML IV SOLN COMPARISON:  CT abdomen and pelvis 09/28/2017 FINDINGS: Examination is technically limited due to motion artifact. Many sequences are nondiagnostic due to motion artifact. Lower chest: No specific acute abnormality identified. Hepatobiliary: Cholelithiasis with multiple large stones in the  gallbladder. No inflammatory changes to suggest cholecystitis. Mild bile duct dilatation is demonstrated. No obstructing mass lesion or stone is identified. No focal liver lesions. Pancreas: No mass, inflammatory changes, or other parenchymal abnormality identified. No pancreatic ductal dilatation. Spleen:  Within normal limits in size and appearance. Adrenals/Urinary Tract: No adrenal gland nodules. Multiple bilateral renal parenchymal cysts. No hydronephrosis or solid mass identified. Stomach/Bowel: Visualized stomach, small bowel, and colon are not abnormally distended and no wall thickening is appreciated. Vascular/Lymphatic: Normal caliber abdominal aorta. No significant lymphadenopathy. Other:  None. Musculoskeletal: No suspicious bone lesions identified. IMPRESSION: 1. Cholelithiasis with large stones in the gallbladder. No evidence of cholecystitis. 2. Mild bile duct dilatation.  No cause identified. 3. Bilateral renal cysts.  No hydronephrosis. 4. Examination is technically limited due to motion artifact. Some sequences are nondiagnostic. Electronically Signed   By: Lucienne Capers M.D.   On: 09/28/2017 23:32   US Abdomen Limited Ruq  Result Date: 09/28/2017 CLINICAL DATA:  Right upper quadrant abdominal pain beginning this morning. EXAM: ULTRASOUND ABDOMEN LIMITED RIGHT UPPER QUADRANT COMPARISON:  Current abdomen pelvis CT FINDINGS: Gallbladder: Gallstones, largest measuring just under 3 cm. Stones cause posterior acoustic shadowing somewhat limiting evaluation of the gallbladder. No gallbladder wall thickening or pericholecystic fluid. There is some sludge. Common bile duct: Diameter: 10 mm.  No sonographic evidence of a duct stone. Liver: Normal size and parenchymal echogenicity. No mass or focal lesion. Mild central intrahepatic bile duct dilation. Portal vein is  patent on color Doppler imaging with normal direction of blood flow towards the liver. IMPRESSION: 1. Gallstones without evidence of  acute cholecystitis. 2. Mild intra and extrahepatic bile duct dilation as noted on the current CT. No sonographic evidence of a duct stone. Electronically Signed   By: Lajean Manes M.D.   On: 09/28/2017 19:04        Scheduled Meds: . enoxaparin (LOVENOX) injection  40 mg Subcutaneous QHS  . mupirocin ointment  1 application Nasal BID  . nicotine  21 mg Transdermal Daily  . pantoprazole  40 mg Oral Daily   Continuous Infusions: . sodium chloride 75 mL/hr at 09/30/17 1140     LOS: 2 days    Time spent: 25 mins.More than 50% of that time was spent in counseling and/or coordination of care.      Shelly Coss, MD [facility] Pager [phone]  If 7PM-7AM, please contact night-coverage [email or web address] Password [email or web address] 09/30/2017, 1:05 PM

## 2017-09-30 NOTE — Anesthesia Procedure Notes (Signed)
Performed by: Alec Mcphee C, CRNA       

## 2017-09-30 NOTE — Anesthesia Postprocedure Evaluation (Signed)
Anesthesia Post Note  Patient: Adrian Miller  Procedure(s) Performed: LAPAROSCOPIC CHOLECYSTECTOMY WITH INTRAOPERATIVE CHOLANGIOGRAM (N/A Abdomen)     Patient location during evaluation: PACU Anesthesia Type: General Level of consciousness: awake and alert Pain management: pain level controlled Vital Signs Assessment: post-procedure vital signs reviewed and stable Respiratory status: spontaneous breathing, nonlabored ventilation, respiratory function stable and patient connected to nasal cannula oxygen Cardiovascular status: blood pressure returned to baseline and stable Postop Assessment: no apparent nausea or vomiting Anesthetic complications: no    Last Vitals:  Vitals:   09/30/17 1115 09/30/17 1127  BP: (!) 145/82 139/75  Pulse: 66 65  Resp: 16 12  Temp: 36.9 C 36.7 C  SpO2: 95% 94%    Last Pain:  Vitals:   09/30/17 1115  TempSrc:   PainSc: Asleep                 Montez Hageman

## 2017-09-30 NOTE — Transfer of Care (Signed)
Immediate Anesthesia Transfer of Care Note  Patient: Adrian Miller  Procedure(s) Performed: LAPAROSCOPIC CHOLECYSTECTOMY WITH INTRAOPERATIVE CHOLANGIOGRAM (N/A Abdomen)  Patient Location: PACU  Anesthesia Type:General  Level of Consciousness: awake, alert  and oriented  Airway & Oxygen Therapy: Patient Spontanous Breathing and Patient connected to face mask oxygen  Post-op Assessment: Report given to RN, Post -op Vital signs reviewed and stable and Patient moving all extremities  Post vital signs: Reviewed and stable  Last Vitals:  Vitals Value Taken Time  BP 152/86 09/30/2017 10:25 AM  Temp    Pulse 85 09/30/2017 10:25 AM  Resp 15 09/30/2017 10:26 AM  SpO2 98 % 09/30/2017 10:25 AM  Vitals shown include unvalidated device data.  Last Pain:  Vitals:   09/30/17 0753  TempSrc:   PainSc: 0-No pain      Patients Stated Pain Goal: 2 (16/10/96 0454)  Complications: No apparent anesthesia complications

## 2017-09-30 NOTE — Progress Notes (Signed)
Patient back from surgery, alert and oriented, denies pain/distress, vital WNL, surgical incision clean/dry/intact. In bed resting, will continue to assess patient.

## 2017-09-30 NOTE — Progress Notes (Signed)
Brief note Operative note reviewed.  Status post lap chole with IOC today.  IOC did not show any filling defects.  Will sign off for now.

## 2017-09-30 NOTE — Interval H&P Note (Signed)
History and Physical Interval Note:  09/30/2017 8:40 AM  Adrian Miller  has presented today for surgery, with the diagnosis of Gallstones  The various methods of treatment have been discussed with the patient and family. After consideration of risks, benefits and other options for treatment, the patient has consented to  Procedure(s): LAPAROSCOPIC CHOLECYSTECTOMY WITH INTRAOPERATIVE CHOLANGIOGRAM (N/A) as a surgical intervention .  The patient's history has been reviewed, patient examined, no change in status, stable for surgery.  I have reviewed the patient's chart and labs.  Questions were answered to the patient's satisfaction.     Darene Lamer Sabrin Dunlevy

## 2017-09-30 NOTE — Op Note (Signed)
Preoperative diagnosis: Cholelithiasis and cholecystitis  Postoperative diagnosis: Cholelithiasis and cholecystitis  Surgical procedure: Laparoscopic cholecystectomy with intraoperative cholangiogram  Surgeon: [name] T. Ralph Brouwer M.D.  Assistant:  None  Anesthesia: General Endotracheal  Complications: None  Estimated blood loss: Minimal  Description of procedure: The patient brought to the operating room, placed in the supine position on the operating table, and general endotracheal anesthesia induced. The abdomen was widely sterilely prepped and draped. The patient had received preoperative IV antibiotics and PAS were in place. Patient timeout was performed the correct procedure verified. Standard 4 port technique was used with an open Hassan cannula at the umbilicus and the remainder of the ports placed under direct vision. The gallbladder was visualized. It appeared thickened and chronically inflamed. The fundus was grasped and elevated up over the liver and the infundibulum retracted inferiolaterally. Peritoneum anterior and posterior to close triangle was incised and fibrofatty tissue stripped off the neck of the gallbladder toward the porta hepatis. The distal gallbladder was thoroughly dissected. The cystic artery was identified in Calot's triangle and the cystic duct gallbladder junction dissected 360.  A good critical view was obtained. When the anatomy was clear the cystic duct was clipped at the gallbladder junction and an operative cholangiogram obtained through the cystic duct. This showed good filling of a mildly dilated common bile duct and intrahepatic ducts with slow but ultimately free flow into the duodenum and no filling defects. Following this the cholangiocath was removed and the cystic duct was doubly clipped proximally and divided. The cystic artery was doubly clipped proximally and distally and divided. The gallbladder was dissected free from its bed using hook cautery and  removed through the umbilical port site after enlarging the defect due to very large stones. Complete hemostasis was obtained in the gallbladder bed. The right upper quadrant was thoroughly irrigated and hemostasis assured. Trochars were removed and all CO2 evacuated and the [facility] trocar site fascial defect closed with several o Vicryl sutures. Skin incisions were closed with subcuticular Monocryl and Dermabond. Sponge needle and instrument counts were correct. The patient was taken to PACU in good condition.  Darene Lamer Tarhonda Hollenberg  09/30/2017

## 2017-09-30 NOTE — Anesthesia Preprocedure Evaluation (Addendum)
Anesthesia Evaluation  Patient identified by MRN, date of birth, ID band Patient awake    Reviewed: Allergy & Precautions, NPO status , Patient's Chart, lab work & pertinent test results  Airway Mallampati: II  TM Distance: >3 FB Neck ROM: Full    Dental  (+) Caps, Loose, Poor Dentition   Pulmonary Current Smoker,    Pulmonary exam normal breath sounds clear to auscultation       Cardiovascular hypertension, Pt. on medications negative cardio ROS Normal cardiovascular exam Rhythm:Regular Rate:Normal     Neuro/Psych negative neurological ROS  negative psych ROS   GI/Hepatic negative GI ROS, Neg liver ROS, hiatal hernia, PUD,   Endo/Other  negative endocrine ROS  Renal/GU negative Renal ROS  negative genitourinary   Musculoskeletal negative musculoskeletal ROS (+)   Abdominal   Peds negative pediatric ROS (+)  Hematology negative hematology ROS (+)   Anesthesia Other Findings   Reproductive/Obstetrics negative OB ROS                            Anesthesia Physical Anesthesia Plan  ASA: II  Anesthesia Plan: General   Post-op Pain Management:    Induction: Intravenous  PONV Risk Score and Plan: 1 and Ondansetron  Airway Management Planned: Oral ETT  Additional Equipment:   Intra-op Plan:   Post-operative Plan: Extubation in OR  Informed Consent: I have reviewed the patients History and Physical, chart, labs and discussed the procedure including the risks, benefits and alternatives for the proposed anesthesia with the patient or authorized representative who has indicated his/her understanding and acceptance.   Dental advisory given  Plan Discussed with: CRNA  Anesthesia Plan Comments:         Anesthesia Quick Evaluation

## 2017-09-30 NOTE — Anesthesia Procedure Notes (Addendum)
Procedure Name: Intubation Date/Time: 09/30/2017 8:55 AM Performed by: [place] Zamiyah Resendes C, CRNA Pre-anesthesia Checklist: Patient identified, Emergency Drugs available, Suction available and Patient being monitored Patient Re-evaluated:Patient Re-evaluated prior to induction Oxygen Delivery Method: Circle system utilized Preoxygenation: Pre-oxygenation with 100% oxygen Induction Type: IV induction, Rapid sequence and Cricoid Pressure applied Ventilation: Mask ventilation without difficulty Laryngoscope Size: Mac and 4 Grade View: Grade II Tube type: Oral Tube size: 7.5 mm Number of attempts: 1 Airway Equipment and Method: Stylet and Oral airway Placement Confirmation: ETT inserted through vocal cords under direct vision,  positive ETCO2 and breath sounds checked- equal and bilateral Secured at: 22 cm Tube secured with: Tape Dental Injury: Teeth and Oropharynx as per pre-operative assessment

## 2017-09-30 NOTE — Progress Notes (Signed)
Patients C/O of surgical pain mid abd 10 out of 10 pain scale, noted mild blood sipping out of the mid abdominal incision patient C/O SOB. Vital 100.3, 166/76 82, 18 91%-RA placed on 2L oxygen  And encouraged patient to use incentive spirometer also Dr. Lucia Gaskins notified orders given to apply dressing and continue to use incentive spirometer and give PO tylenol. Patient in bed resting, will continue monitor patient.

## 2017-10-01 ENCOUNTER — Encounter (HOSPITAL_COMMUNITY): Payer: Self-pay | Admitting: General Surgery

## 2017-10-01 ENCOUNTER — Inpatient Hospital Stay (HOSPITAL_COMMUNITY): Payer: Medicare Other

## 2017-10-01 LAB — COMPREHENSIVE METABOLIC PANEL
ALT: 275 U/L — ABNORMAL HIGH (ref 17–63)
AST: 83 U/L — ABNORMAL HIGH (ref 15–41)
Albumin: 3.7 g/dL (ref 3.5–5.0)
Alkaline Phosphatase: 218 U/L — ABNORMAL HIGH (ref 38–126)
Anion gap: 12 (ref 5–15)
BUN: 15 mg/dL (ref 6–20)
CO2: 22 mmol/L (ref 22–32)
Calcium: 9 mg/dL (ref 8.9–10.3)
Chloride: 104 mmol/L (ref 101–111)
Creatinine, Ser: 1.16 mg/dL (ref 0.61–1.24)
GFR calc Af Amer: >60 mL/min (ref >60)
GFR calc non Af Amer: 59 mL/min — ABNORMAL LOW (ref >60)
Glucose, Bld: 118 mg/dL — ABNORMAL HIGH (ref 65–99)
Potassium: 3.8 mmol/L (ref 3.5–5.1)
Sodium: 138 mmol/L (ref 135–145)
Total Bilirubin: 1.8 mg/dL — ABNORMAL HIGH (ref 0.3–1.2)
Total Protein: 7.2 g/dL (ref 6.5–8.1)

## 2017-10-01 MED ORDER — SODIUM CHLORIDE 0.9 % IV BOLUS
500.0000 mL | Freq: Once | INTRAVENOUS | Status: AC
  Administered 2017-10-01: 500 mL via INTRAVENOUS

## 2017-10-01 NOTE — Progress Notes (Signed)
Patient's daughter, Adrian Miller, called to speak to the charge RN. She expressed that she needed to know if her dad was being discharged today or not. States that she spoke with the patient's bedside RN and gotten an update from her at about 9:15 this morning but that she did not feel comfortable with the responses that she received from her. She states that the bedside RN reported that she had not been in to see the patient yet and that she did not know if the patient was being discharged. Also she stated that the bedside RN could not tell her what surgery her father had. Charge RN updated the daughter about the type of surgery and the size of the stones from the Op note. Charge RN also informed the daughter that his nurse had been in to see her father during bedside shift report at 7:30a and that it was not uncommon to for her to be getting back around at that time. Also informed the daughter that the MD was the rounding MD and that he did not start his rounds until 9a and so the RN would not have known at the time of the phone call if the patient was going to be discharged today. Charge RN was able to ask the bedside RN about a plan for discharge today while on the phone with the daughter and she was informed that the patient would not be discharged today. At this point in the phone call the daughter's tone changed and she seemed unhappy with the responses that the charge RN was able to provide. Charge RN offered to request that the Surgeon MD or Hospitalist MD call her with an update but she stated "no, I'll just call and talk to my dad and I'll be up there soon."  After the phone call, charge RN discussed the concerns with the bedside RN. Bedside RN stated that she did talk to the daughter at about 9:15a and that she was at the patient's bedside at that time. She updated the daughter about the type of surgery that the patient had and that the MD wouldn't be rounding until a little later as he started rounding  at [facility], and so she was not yet aware of the discharge plan for today.

## 2017-10-01 NOTE — Progress Notes (Signed)
[facility] Progress Note  1 Day Post-Op  Subjective: CC:  Abd pain improved s/p surgery. Pt endorses mild cramping gas pain and abdominal soreness. He is having flatus. Mobilizing in the hall. Tolerating PO without N/V. Pulling 1250 on IS.  Had a fever around 0500 of 101 F Objective: Vital signs in last 24 hours: Temp:  [97.6 F (36.4 C)-101.9 F (38.8 C)] 98.8 F (37.1 C) (05/28 0800) Pulse Rate:  [63-97] 97 (05/28 0515) Resp:  [12-20] 20 (05/28 0515) BP: (125-166)/(67-86) 125/78 (05/28 0515) SpO2:  [91 %-100 %] 91 % (05/28 0515) Weight:  [91.1 kg (200 lb 13.4 oz)] 91.1 kg (200 lb 13.4 oz) (05/28 0523) Last BM Date: 09/29/17  Intake/Output from previous day: 05/27 0701 - 05/28 0700 In: 1751.3 [P.O.:60; I.V.:1641.3; IV Piggyback:50] Out: 2090 [Urine:2075; Blood:15] Intake/Output this shift: No intake/output data recorded.  PE: Gen:  Alert, NAD, pleasant Card:  Regular rate and rhythm Pulm:  Normal effort Abd: Soft, non-tender, non-distended, bowel sounds present, incisions C/D/I, no active bleeding or drainage. Skin: warm and dry, no rashes  Psych: A&Ox3   Lab Results:  Recent Labs    09/28/17 1506 09/29/17 0530  WBC 12.5* 9.4  HGB 14.4 13.0  HCT 41.3 37.8*  PLT 340 319   BMET Recent Labs    09/30/17 0505 10/01/17 0524  NA 138 138  K 3.8 3.8  CL 104 104  CO2 22 22  GLUCOSE 88 118*  BUN 18 15  CREATININE 1.05 1.16  CALCIUM 9.2 9.0   PT/INR No results for input(s): LABPROT, INR in the last 72 hours. CMP     Component Value Date/Time   NA 138 10/01/2017 0524   K 3.8 10/01/2017 0524   CL 104 10/01/2017 0524   CO2 22 10/01/2017 0524   GLUCOSE 118 (H) 10/01/2017 0524   BUN 15 10/01/2017 0524   CREATININE 1.16 10/01/2017 0524   CALCIUM 9.0 10/01/2017 0524   PROT 7.2 10/01/2017 0524   ALBUMIN 3.7 10/01/2017 0524   AST 83 (H) 10/01/2017 0524   ALT 275 (H) 10/01/2017 0524   ALKPHOS 218 (H) 10/01/2017 0524   BILITOT 1.8 (H) 10/01/2017  0524   GFRNONAA 59 (L) 10/01/2017 0524   GFRAA >60 10/01/2017 0524   Lipase     Component Value Date/Time   LIPASE 40 09/28/2017 1654       Studies/Results: Dg Cholangiogram Operative  Result Date: 09/30/2017 CLINICAL DATA:  Gallstones EXAM: INTRAOPERATIVE CHOLANGIOGRAM TECHNIQUE: Cholangiographic images from the C-arm fluoroscopic device were submitted for interpretation post-operatively. Please see the procedural report for the amount of contrast and the fluoroscopy time utilized. COMPARISON:  None. FINDINGS: Contrast fills the biliary tree and duodenum without filling defects in the common bile duct. IMPRESSION: Patent biliary tree. Electronically Signed   By: Marybelle Killings M.D.   On: 09/30/2017 10:03   Dg Chest Port 1 View  Result Date: 10/01/2017 CLINICAL DATA:  Fever. EXAM: PORTABLE CHEST 1 VIEW COMPARISON:  09/28/2017 FINDINGS: New opacity is seen both lung bases which may be due to atelectasis or infiltrate. No evidence of pleural effusion. Heart size is normal. Aortic atherosclerosis. IMPRESSION: New mild bibasilar atelectasis versus infiltrates. Electronically Signed   By: Earle Gell M.D.   On: 10/01/2017 07:41    Anti-infectives: Anti-infectives (From admission, onward)   Start     Dose/Rate Route Frequency Ordered Stop   09/30/17 0600  ceFAZolin (ANCEF) IVPB 2g/100 mL premix  Status:  Discontinued     2  g 200 mL/hr over 30 Minutes Intravenous 90 min pre-op 09/29/17 1034 09/30/17 1123       Assessment/Plan HF w/ pEF HTN Chronic pain Tobacco abuse  Biliary colic  S/P laparoscopic cholecystectomy w/ IOC 09/30/17 Dr. Excell Seltzer -  POD#1 -  Had a fever yesterday afternoon, otherwise VSS -  IOC negative, LFTs including total bilirubin trending down  -  Tolerating SOFT diet, advance to low fat diet as tolerated -  Mobilize and IS  FEN: SOFT ID: perioperative ancef VTE: SCD's, lovenox  Foley: none  Follow up: [facility]   Plan: tylenol for fever and follow fever  curve, advance diet to low fat. Clinically improving. Hopefully stable for discharge home tomorrow.    LOS: 3 days    [facility] 10/01/2017, 10:16 AM Pager: [phone] Consults: [phone] Mon-Fri 7:00 am-4:30 pm Sat-Sun 7:00 am-11:30 am

## 2017-10-01 NOTE — Progress Notes (Addendum)
PROGRESS NOTE    Adrian Miller  MLY:[id number] DOB: 09-Aug-1940 DOA: 09/28/2017 PCP: Janith Lima, MD   Brief Narrative: Patient is a 77 year old male with past medical history of hypertension, hyperlipidemia, diastolic dysfunction, and right upper quadrant pain.  Duodenal ulcer, esophagitis who presented to the emergency department with complaints of chest pain and right upper quadrant pain.  On presentation, his liver enzymes were found to be elevated.  CT imaging showed intrahepatic dilatation and cholelithiasis without signs of cholecystitis.  MRCP showed the same finding.  GI and general surgery were consulted.  He symptoms were suggestive of biliary colic and he underwent laparoscopic cholecystectomy on 09/30/17. Patient had mild grade fever last night and he has been observed for 1 more day.  Possible discharge tomorrow.  Assessment & Plan:   Principal Problem:   Abdominal pain Active Problems:   Leukocytosis   Benign essential HTN   GERD (gastroesophageal reflux disease)   Cholelithiasis   Elevated liver enzymes   Renal insufficiency   Right upper quadrant pain/elevated liver enzymes: CT imaging showed intra-and extrahepatic duct dilatation and cholelithiasis without cholecystitis.  MRCP showed same.  General surgery was consulted.  His symptoms were suggestive of  biliary colic.  Underwent  laparoscopic cholecystectomy with intraoperative cholangiogram which did not show filling  defects. Liver enzymes improving.  Fever: Found to be febrile last night.  This morning he was afebrile.  We have sent blood cultures.Hold on antibiotics.  Chest pain: Resolved.Atypical chest pain.  His symptoms are consistent with GERD or esophagitis.  Continue PPI.  Troponins are negative and EKG did not show any ischemic changes.  Pain improved with GI cocktail.  Leukocytosis: Resolved.  Chest x-ray did not show any acute infiltrate.    Acute kidney injury: Found to have acute kidney injury on  presentation .Resolved with IV fluids.  He was on diuretics and ARB at home which are on hold.  Can resume on discharge.  Heart failure with preserved ejection fraction: Echocardiogram as per 1/17 showed ejection fraction of 60 to 65% with grade 2 diastolic dysfunction.  He is euvolemic at present.  History of essential hypertension: Currently blood pressure stable.  Continue to monitor.  Home medications on hold.Can resumed on discharge.  Chronic pain syndrome: Continue his home medications.  Tobacco abuse: Continue nicotine patch.  Counseled for smoking cessation.     DVT prophylaxis: Lovenox Code Status: Full Family Communication: No family present at the bedside Disposition Plan: Likely home tomorrow after surgical clearance   Consultants: GI, surgery  Procedures: Laparoscopic cholecystectomy on 09/30/2017  Antimicrobials: None  Subjective: Patient seen and examined the bedside this morning.  Remains comfortable.  Was found to be febrile overnight.  Blood cultures have been sent.    Objective: Vitals:   10/01/17 0523 10/01/17 0800 10/01/17 1227 10/01/17 1330  BP:    133/75  Pulse:    90  Resp:    16  Temp:  98.8 F (37.1 C)  99.8 F (37.7 C)  TempSrc:  Oral  Oral  SpO2:   94% 98%  Weight: 91.1 kg (200 lb 13.4 oz)     Height:        Intake/Output Summary (Last 24 hours) at 10/01/2017 1406 Last data filed at 10/01/2017 1217 Gross per 24 hour  Intake 180 ml  Output 1400 ml  Net -1220 ml   Filed Weights   09/29/17 0442 09/30/17 0519 10/01/17 0523  Weight: 87.5 kg (192 lb 14.4 oz) 88.7 kg (195  lb 8.8 oz) 91.1 kg (200 lb 13.4 oz)    Examination:  General exam: Appears calm and comfortable ,Not in distress,average built HEENT:PERRL,Oral mucosa moist, Ear/Nose normal on gross exam Respiratory system: Bilateral equal air entry, normal vesicular breath sounds, no wheezes or crackles  Cardiovascular system: S1 & S2 heard, RRR. No JVD, murmurs, rubs, gallops or  clicks. Gastrointestinal system: Abdomen is nondistended, soft and nontender. No organomegaly or masses felt. Normal bowel sounds heard.Clean surgical scars. Central nervous system: Alert and oriented. No focal neurological deficits. Extremities: No edema, no clubbing ,no cyanosis, distal peripheral pulses palpable. Skin: No rashes, lesions or ulcers,no icterus ,no pallor MSK: Normal muscle bulk,tone ,power Psychiatry: Judgement and insight appear normal. Mood & affect appropriate.      Data Reviewed: I have personally reviewed following labs and imaging studies  CBC: Recent Labs  Lab 09/28/17 1506 09/29/17 0530  WBC 12.5* 9.4  HGB 14.4 13.0  HCT 41.3 37.8*  MCV 90.8 90.9  PLT 340 782   Basic Metabolic Panel: Recent Labs  Lab 09/28/17 1506 09/29/17 0530 09/30/17 0505 10/01/17 0524  NA 136 139 138 138  K 4.1 3.9 3.8 3.8  CL 96* 105 104 104  CO2 27 24 22 22   GLUCOSE 120* 108* 88 118*  BUN 27* 22* 18 15  CREATININE 1.34* 1.04 1.05 1.16  CALCIUM 9.9 9.8 9.2 9.0   GFR: Estimated Creatinine Clearance: 60.5 mL/min (by C-G formula based on SCr of 1.16 mg/dL). Liver Function Tests: Recent Labs  Lab 09/28/17 1654 09/29/17 0530 09/30/17 0505 10/01/17 0524  AST 524* 447* 196* 83*  ALT 365* 592* 471* 275*  ALKPHOS 150* 228* 268* 218*  BILITOT 2.4* 3.7* 2.3* 1.8*  PROT 8.0 6.9 7.4 7.2  ALBUMIN 4.3 3.5 3.8 3.7   Recent Labs  Lab 09/28/17 1654  LIPASE 40   No results for input(s): AMMONIA in the last 168 hours. Coagulation Profile: No results for input(s): INR, PROTIME in the last 168 hours. Cardiac Enzymes: No results for input(s): CKTOTAL, CKMB, CKMBINDEX, TROPONINI in the last 168 hours. BNP (last 3 results) No results for input(s): PROBNP in the last 8760 hours. HbA1C: No results for input(s): HGBA1C in the last 72 hours. CBG: No results for input(s): GLUCAP in the last 168 hours. Lipid Profile: No results for input(s): CHOL, HDL, LDLCALC, TRIG, CHOLHDL,  LDLDIRECT in the last 72 hours. Thyroid Function Tests: No results for input(s): TSH, T4TOTAL, FREET4, T3FREE, THYROIDAB in the last 72 hours. Anemia Panel: No results for input(s): VITAMINB12, FOLATE, FERRITIN, TIBC, IRON, RETICCTPCT in the last 72 hours. Sepsis Labs: No results for input(s): PROCALCITON, LATICACIDVEN in the last 168 hours.  Recent Results (from the past 240 hour(s))  MRSA PCR Screening     Status: None   Collection Time: 09/29/17  1:32 AM  Result Value Ref Range Status   MRSA by PCR NEGATIVE NEGATIVE Final    Comment:        The GeneXpert MRSA Assay (FDA approved for NASAL specimens only), is one component of a comprehensive MRSA colonization surveillance program. It is not intended to diagnose MRSA infection nor to guide or monitor treatment for MRSA infections. Performed at [facility], [place] [address] [place], [place] [address]   Surgical PCR screen     Status: None   Collection Time: 09/29/17 10:21 PM  Result Value Ref Range Status   MRSA, PCR NEGATIVE NEGATIVE Final   Staphylococcus aureus NEGATIVE NEGATIVE Final    Comment: (  NOTE) The Xpert SA Assay (FDA approved for NASAL specimens in patients 31 years of age and older), is one component of a comprehensive surveillance program. It is not intended to diagnose infection nor to guide or monitor treatment. Performed at [facility], [place] [address], [place], [place] [address]          Radiology Studies: Dg Cholangiogram Operative  Result Date: 09/30/2017 CLINICAL DATA:  Gallstones EXAM: INTRAOPERATIVE CHOLANGIOGRAM TECHNIQUE: Cholangiographic images from the C-arm fluoroscopic device were submitted for interpretation post-operatively. Please see the procedural report for the amount of contrast and the fluoroscopy time utilized. COMPARISON:  None. FINDINGS: Contrast fills the biliary tree and duodenum without filling defects in the common bile duct. IMPRESSION:  Patent biliary tree. Electronically Signed   By: Marybelle Killings M.D.   On: 09/30/2017 10:03   Dg Chest Port 1 View  Result Date: 10/01/2017 CLINICAL DATA:  Fever. EXAM: PORTABLE CHEST 1 VIEW COMPARISON:  09/28/2017 FINDINGS: New opacity is seen both lung bases which may be due to atelectasis or infiltrate. No evidence of pleural effusion. Heart size is normal. Aortic atherosclerosis. IMPRESSION: New mild bibasilar atelectasis versus infiltrates. Electronically Signed   By: Earle Gell M.D.   On: 10/01/2017 07:41        Scheduled Meds: . enoxaparin (LOVENOX) injection  40 mg Subcutaneous QHS  . mupirocin ointment  1 application Nasal BID  . nicotine  21 mg Transdermal Daily  . pantoprazole  40 mg Oral Daily   Continuous Infusions:    LOS: 3 days    Time spent: 25 mins.More than 50% of that time was spent in counseling and/or coordination of care.      Shelly Coss, MD [facility] Pager [phone]  If 7PM-7AM, please contact night-coverage [email or web address] Password [email or web address] 10/01/2017, 2:06 PM

## 2017-10-01 NOTE — Plan of Care (Signed)
  Problem: Clinical Measurements: Goal: Ability to maintain clinical measurements within normal limits will improve Outcome: Progressing Goal: Will remain free from infection Outcome: Progressing Goal: Diagnostic test results will improve Outcome: Progressing   Problem: Activity: Goal: Risk for activity intolerance will decrease Outcome: Progressing   Problem: Elimination: Goal: Will not experience complications related to bowel motility Outcome: Progressing   Problem: Safety: Goal: Ability to remain free from injury will improve Outcome: Progressing   

## 2017-10-01 NOTE — Progress Notes (Signed)
While I was in room getting patient situated in the chair after walking the patient in the hall,  Patient's daughter Annye Rusk called and asked if the patient is being discharged today, inform daughter that the surgical Dr or medical doctor have not rounded yet  And that the surgical MD will decide if patient and when patient will be discharged, then she asked what the size of the stone that they removed from the gallbladder, I informed her that am not sure and that there is no documented size of the the gallbladder that was removed, she stated "I already talk to my dad but I want to hear it from someone else" I asked if she wants the MD to call her or she wants to talk to her father she stated no and hang up. Patient informed.

## 2017-10-02 ENCOUNTER — Inpatient Hospital Stay (HOSPITAL_COMMUNITY): Payer: Medicare Other

## 2017-10-02 DIAGNOSIS — N289 Disorder of kidney and ureter, unspecified: Secondary | ICD-10-CM

## 2017-10-02 DIAGNOSIS — K219 Gastro-esophageal reflux disease without esophagitis: Secondary | ICD-10-CM

## 2017-10-02 DIAGNOSIS — R1031 Right lower quadrant pain: Secondary | ICD-10-CM

## 2017-10-02 DIAGNOSIS — K8021 Calculus of gallbladder without cholecystitis with obstruction: Secondary | ICD-10-CM

## 2017-10-02 DIAGNOSIS — I10 Essential (primary) hypertension: Secondary | ICD-10-CM

## 2017-10-02 DIAGNOSIS — R748 Abnormal levels of other serum enzymes: Secondary | ICD-10-CM

## 2017-10-02 DIAGNOSIS — D72829 Elevated white blood cell count, unspecified: Secondary | ICD-10-CM

## 2017-10-02 LAB — HEPATIC FUNCTION PANEL
ALT: 186 U/L — ABNORMAL HIGH (ref 17–63)
AST: 39 U/L (ref 15–41)
Albumin: 3.8 g/dL (ref 3.5–5.0)
Alkaline Phosphatase: 196 U/L — ABNORMAL HIGH (ref 38–126)
Bilirubin, Direct: 0.6 mg/dL — ABNORMAL HIGH (ref 0.1–0.5)
Indirect Bilirubin: 1.1 mg/dL — ABNORMAL HIGH (ref 0.3–0.9)
Total Bilirubin: 1.7 mg/dL — ABNORMAL HIGH (ref 0.3–1.2)
Total Protein: 7.7 g/dL (ref 6.5–8.1)

## 2017-10-02 LAB — URINALYSIS, ROUTINE W REFLEX MICROSCOPIC
Bacteria, UA: NONE SEEN
Bilirubin Urine: NEGATIVE
Glucose, UA: NEGATIVE mg/dL
Ketones, ur: 5 mg/dL — AB
Leukocytes, UA: NEGATIVE
Nitrite: NEGATIVE
Protein, ur: NEGATIVE mg/dL
Specific Gravity, Urine: 1.021 (ref 1.005–1.030)
pH: 5 (ref 5.0–8.0)

## 2017-10-02 LAB — CBC WITH DIFFERENTIAL/PLATELET
Basophils Absolute: 0 10*3/uL (ref 0.0–0.1)
Basophils Relative: 0 %
Eosinophils Absolute: 0 10*3/uL (ref 0.0–0.7)
Eosinophils Relative: 0 %
HCT: 38.3 % — ABNORMAL LOW (ref 39.0–52.0)
Hemoglobin: 13.3 g/dL (ref 13.0–17.0)
Lymphocytes Relative: 8 %
Lymphs Abs: 1.7 10*3/uL (ref 0.7–4.0)
MCH: 32 pg (ref 26.0–34.0)
MCHC: 34.7 g/dL (ref 30.0–36.0)
MCV: 92.1 fL (ref 78.0–100.0)
Monocytes Absolute: 2.2 10*3/uL — ABNORMAL HIGH (ref 0.1–1.0)
Monocytes Relative: 10 %
Neutro Abs: 18.7 10*3/uL — ABNORMAL HIGH (ref 1.7–7.7)
Neutrophils Relative %: 82 %
Platelets: 282 10*3/uL (ref 150–400)
RBC: 4.16 MIL/uL — ABNORMAL LOW (ref 4.22–5.81)
RDW: 13.9 % (ref 11.5–15.5)
WBC: 22.6 10*3/uL — ABNORMAL HIGH (ref 4.0–10.5)

## 2017-10-02 LAB — BASIC METABOLIC PANEL
Anion gap: 13 (ref 5–15)
BUN: 15 mg/dL (ref 6–20)
CO2: 24 mmol/L (ref 22–32)
Calcium: 9.2 mg/dL (ref 8.9–10.3)
Chloride: 97 mmol/L — ABNORMAL LOW (ref 101–111)
Creatinine, Ser: 1.17 mg/dL (ref 0.61–1.24)
GFR calc Af Amer: >60 mL/min (ref >60)
GFR calc non Af Amer: 59 mL/min — ABNORMAL LOW (ref >60)
Glucose, Bld: 145 mg/dL — ABNORMAL HIGH (ref 65–99)
Potassium: 3.6 mmol/L (ref 3.5–5.1)
Sodium: 134 mmol/L — ABNORMAL LOW (ref 135–145)

## 2017-10-02 LAB — PROCALCITONIN: Procalcitonin: 0.42 ng/mL

## 2017-10-02 MED ORDER — POLYETHYLENE GLYCOL 3350 17 G PO PACK
17.0000 g | PACK | Freq: Every day | ORAL | Status: DC
  Administered 2017-10-02: 17 g via ORAL
  Filled 2017-10-02: qty 1

## 2017-10-02 MED ORDER — SENNOSIDES-DOCUSATE SODIUM 8.6-50 MG PO TABS
1.0000 | ORAL_TABLET | Freq: Two times a day (BID) | ORAL | Status: DC
  Administered 2017-10-02 – 2017-10-04 (×4): 1 via ORAL
  Filled 2017-10-02 (×4): qty 1

## 2017-10-02 NOTE — Discharge Instructions (Signed)
Please arrive at least 30 min before your appointment to complete your check in paperwork.  If you are unable to arrive 30 min prior to your appointment time we may have to cancel or reschedule you. ° °LAPAROSCOPIC SURGERY: POST OP INSTRUCTIONS  °1. DIET: Follow a light bland diet the first 24 hours after arrival home, such as soup, liquids, crackers, etc. Be sure to include lots of fluids daily. Avoid fast food or heavy meals as your are more likely to get nauseated. Eat a low fat the next few days after surgery.  °2. Take your usually prescribed home medications unless otherwise directed. °3. PAIN CONTROL:  °1. Pain is best controlled by a usual combination of three different methods TOGETHER:  °1. Ice/Heat °2. Over the counter pain medication °3. Prescription pain medication °2. Most patients will experience some swelling and bruising around the incisions. Ice packs or heating pads (30-60 minutes up to 6 times a day) will help. Use ice for the first few days to help decrease swelling and bruising, then switch to heat to help relax tight/sore spots and speed recovery. Some people prefer to use ice alone, heat alone, alternating between ice & heat. Experiment to what works for you. Swelling and bruising can take several weeks to resolve.  °3. It is helpful to take an over-the-counter pain medication regularly for the first few weeks. Choose one of the following that works best for you:  °1. Naproxen (Aleve, etc) Two 220mg tabs twice a day °2. Ibuprofen (Advil, etc) Three 200mg tabs four times a day (every meal & bedtime) °3. Acetaminophen (Tylenol, etc) 500-650mg four times a day (every meal & bedtime) °4. A prescription for pain medication (such as oxycodone, hydrocodone, etc) should be given to you upon discharge. Take your pain medication as prescribed.  °1. If you are having problems/concerns with the prescription medicine (does not control pain, nausea, vomiting, rash, itching, etc), please call us [phone]  [phone] to see if we need to switch you to a different pain medicine that will work better for you and/or control your side effect better. °2. If you need a refill on your pain medication, please contact your pharmacy. They will contact our office to request authorization. Prescriptions will not be filled after 5 pm or on week-ends. °4. Avoid getting constipated. Between the surgery and the pain medications, it is common to experience some constipation. Increasing fluid intake and taking a fiber supplement (such as Metamucil, Citrucel, FiberCon, MiraLax, etc) 1-2 times a day regularly will usually help prevent this problem from occurring. A mild laxative (prune juice, Milk of Magnesia, MiraLax, etc) should be taken according to package directions if there are no bowel movements after 48 hours.  °5. Watch out for diarrhea. If you have many loose bowel movements, simplify your diet to bland foods & liquids for a few days. Stop any stool softeners and decrease your fiber supplement. Switching to mild anti-diarrheal medications (Kayopectate, Pepto Bismol) can help. If this worsens or does not improve, please call us. °6. Wash / shower every day. You may shower over the dressings as they are waterproof. Continue to shower over incision(s) after the dressing is off. °7. Remove your waterproof bandages 5 days after surgery. You may leave the incision open to air. You may replace a dressing/Band-Aid to cover the incision for comfort if you wish.  °8. ACTIVITIES as tolerated:  °1. You may resume regular (light) daily activities beginning the next day--such as daily self-care, walking, climbing stairs--gradually   increasing activities as tolerated. If you can walk 30 minutes without difficulty, it is safe to try more intense activity such as jogging, treadmill, bicycling, low-impact aerobics, swimming, etc. °2. Save the most intensive and strenuous activity for last such as sit-ups, heavy lifting, contact sports, etc Refrain  from any heavy lifting or straining until you are off narcotics for pain control.  °3. DO NOT PUSH THROUGH PAIN. Let pain be your guide: If it hurts to do something, don't do it. Pain is your body warning you to avoid that activity for another week until the pain goes down. °4. You may drive when you are no longer taking prescription pain medication, you can comfortably wear a seatbelt, and you can safely maneuver your car and apply brakes. °5. You may have sexual intercourse when it is comfortable.  °9. FOLLOW UP in our office  °1. Please call [facility] at [phone] to set up an appointment to see your surgeon in the office for a follow-up appointment approximately 2-3 weeks after your surgery. °2. Make sure that you call for this appointment the day you arrive home to insure a convenient appointment time. °     10. IF YOU HAVE DISABILITY OR FAMILY LEAVE FORMS, BRING THEM TO THE               OFFICE FOR PROCESSING.  ° °WHEN TO CALL US [phone]:  °1. Poor pain control °2. Reactions / problems with new medications (rash/itching, nausea, etc)  °3. Fever over 101.5 F (38.5 C) °4. Inability to urinate °5. Nausea and/or vomiting °6. Worsening swelling or bruising °7. Continued bleeding from incision. °8. Increased pain, redness, or drainage from the incision ° °The clinic staff is available to answer your questions during regular business hours (8:30am-5pm). Please don’t hesitate to call and ask to speak to one of our nurses for clinical concerns.  °If you have a medical emergency, go to the nearest emergency room or call 911.  °A surgeon from [facility] is always on call at the hospitals  ° °[facility]  °[address], [address], [place], [place] [address] ?  °MAIN: [phone] ? TOLL FREE: [phone] ?  °FAX [phone]  °[email or web address] ° °

## 2017-10-02 NOTE — Care Management Important Message (Signed)
Important Message  Patient Details  Name: Adrian Miller MRN: [id number] Date of Birth: 06/18/1940   Medicare Important Message Given:  Yes    Kerin Salen 10/02/2017, 12:19 [place] Message  Patient Details  Name: Adrian Miller MRN: [id number] Date of Birth: 20-Jul-1940   Medicare Important Message Given:  Yes    Kerin Salen 10/02/2017, 12:19 PM

## 2017-10-02 NOTE — Plan of Care (Signed)
  Problem: Safety: Goal: Ability to remain free from injury will improve Outcome: Progressing   

## 2017-10-02 NOTE — Progress Notes (Signed)
PROGRESS NOTE  ISA KOHLENBERG HWE:[id number] DOB: 07/01/1940 DOA: 09/28/2017 PCP: Janith Lima, MD  HPI/Recap of past 24 hours: Jadarian Mckay is a 77 year old male with medical history significant for hypertension, hyperlipidemia, diastolic dysfunction, duodenal ulcer, esophagitis, presents to the ER with complaints of right upper quadrant pain.  In the ED, liver enzymes were found to be elevated.  CT abdomen showed intrahepatic dilatation and cholelithiasis without signs of cholecystitis.  MRCP showed same finding.  GI and general surgery were consulted.  Patient underwent laparoscopic cholecystectomy on 09/30/2017.  On 10/01/2017 patient noted to be febrile, with worsening leukocytosis today.  Today, patient complained of right shoulder pain, of which he gets steroid shots as an outpatient.  Patient denied any worsening abdominal pain, significant cough, dysuria, chest pain, shortness of breath.  Assessment/Plan: Principal Problem:   Abdominal pain Active Problems:   Leukocytosis   Benign essential HTN   GERD (gastroesophageal reflux disease)   Cholelithiasis   Elevated liver enzymes   Renal insufficiency  Biliary colic/cholelithiasis Status post laparoscopic cholecystectomy on 09/30/2017 Elevated liver enzymes, trending down CT abdomen showed intra-and extrahepatic duct dilatation, cholelithiasis without cholecystitis MRCP showed the same General surgery on board  Postop fever/leukocytosis Unknown etiology Currently afebrile, WBC 22.6 Procalcitonin pending BC x2 pending Will order UA/UC Chest x-ray showed possible mild bibasilar atelectasis versus infiltrates (no cough) We will monitor closely, may start antibiotics pending work-up  AKI Resolved status post IV fluids Held diuretics, ARB, resume on discharge  Diastolic heart failure Appears euvolemic Echo showed EF of 60 to 89%, grade 2 diastolic dysfunction Monitor closely  Hypertension Stable  Tobacco  abuse Counseled smoking cessation Continue nicotine patch  Chronic pain syndrome Continue home pain meds     Code Status: Full  Family Communication: None at bedside  Disposition Plan: Home once stable   Consultants:  General surgery  GI  Procedures:  Laparoscopic cholecystectomy 09/30/2017  Antimicrobials:  None  DVT prophylaxis: Lovenox   Objective: Vitals:   10/01/17 2016 10/02/17 0548 10/02/17 0549 10/02/17 1406  BP: 137/77  (!) 157/76 140/88  Pulse: 71  85 96  Resp: 18  20 18   Temp: 98.6 F (37 C)  98.5 F (36.9 C) 100.1 F (37.8 C)  TempSrc: Oral  Oral Oral  SpO2: 95%  96% 96%  Weight:  87.1 kg (192 lb 0.3 oz)    Height:        Intake/Output Summary (Last 24 hours) at 10/02/2017 1824 Last data filed at 10/02/2017 1736 Gross per 24 hour  Intake -  Output 875 ml  Net -875 ml   Filed Weights   09/30/17 0519 10/01/17 0523 10/02/17 0548  Weight: 88.7 kg (195 lb 8.8 oz) 91.1 kg (200 lb 13.4 oz) 87.1 kg (192 lb 0.3 oz)    Exam:   General: NAD  Cardiovascular: S1, S2 present  Respiratory: CTA B  Abdomen: Soft, nontender, nondistended, bowel sounds present, incision sites C/D/I  Musculoskeletal: No pedal edema bilaterally  Skin: Normal  Psychiatry: Normal mood   Data Reviewed: CBC: Recent Labs  Lab 09/28/17 1506 09/29/17 0530 10/02/17 0539  WBC 12.5* 9.4 22.6*  NEUTROABS  --   --  18.7*  HGB 14.4 13.0 13.3  HCT 41.3 37.8* 38.3*  MCV 90.8 90.9 92.1  PLT 340 319 381   Basic Metabolic Panel: Recent Labs  Lab 09/28/17 1506 09/29/17 0530 09/30/17 0505 10/01/17 0524 10/02/17 0539  NA 136 139 138 138 134*  K 4.1 3.9 3.8 3.8  3.6  CL 96* 105 104 104 97*  CO2 27 24 22 22 24   GLUCOSE 120* 108* 88 118* 145*  BUN 27* 22* 18 15 15   CREATININE 1.34* 1.04 1.05 1.16 1.17  CALCIUM 9.9 9.8 9.2 9.0 9.2   GFR: Estimated Creatinine Clearance: 58.7 mL/min (by C-G formula based on SCr of 1.17 mg/dL). Liver Function Tests: Recent  Labs  Lab 09/28/17 1654 09/29/17 0530 09/30/17 0505 10/01/17 0524 10/02/17 0539  AST 524* 447* 196* 83* 39  ALT 365* 592* 471* 275* 186*  ALKPHOS 150* 228* 268* 218* 196*  BILITOT 2.4* 3.7* 2.3* 1.8* 1.7*  PROT 8.0 6.9 7.4 7.2 7.7  ALBUMIN 4.3 3.5 3.8 3.7 3.8   Recent Labs  Lab 09/28/17 1654  LIPASE 40   No results for input(s): AMMONIA in the last 168 hours. Coagulation Profile: No results for input(s): INR, PROTIME in the last 168 hours. Cardiac Enzymes: No results for input(s): CKTOTAL, CKMB, CKMBINDEX, TROPONINI in the last 168 hours. BNP (last 3 results) No results for input(s): PROBNP in the last 8760 hours. HbA1C: No results for input(s): HGBA1C in the last 72 hours. CBG: No results for input(s): GLUCAP in the last 168 hours. Lipid Profile: No results for input(s): CHOL, HDL, LDLCALC, TRIG, CHOLHDL, LDLDIRECT in the last 72 hours. Thyroid Function Tests: No results for input(s): TSH, T4TOTAL, FREET4, T3FREE, THYROIDAB in the last 72 hours. Anemia Panel: No results for input(s): VITAMINB12, FOLATE, FERRITIN, TIBC, IRON, RETICCTPCT in the last 72 hours. Urine analysis:    Component Value Date/Time   COLORURINE AMBER (A) 09/28/2017 2207   APPEARANCEUR CLEAR 09/28/2017 2207   LABSPEC 1.036 (H) 09/28/2017 2207   PHURINE 7.0 09/28/2017 2207   GLUCOSEU NEGATIVE 09/28/2017 2207   GLUCOSEU NEGATIVE 03/18/2014 1621   HGBUR NEGATIVE 09/28/2017 2207   BILIRUBINUR NEGATIVE 09/28/2017 2207   BILIRUBINUR small 02/05/2012 1931   KETONESUR NEGATIVE 09/28/2017 2207   PROTEINUR NEGATIVE 09/28/2017 2207   UROBILINOGEN 0.2 02/11/2015 0929   NITRITE NEGATIVE 09/28/2017 2207   LEUKOCYTESUR NEGATIVE 09/28/2017 2207   Sepsis Labs: @LABRCNTIP (procalcitonin:4,lacticidven:4)  ) Recent Results (from the past 240 hour(s))  MRSA PCR Screening     Status: None   Collection Time: 09/29/17  1:32 AM  Result Value Ref Range Status   MRSA by PCR NEGATIVE NEGATIVE Final    Comment:         The GeneXpert MRSA Assay (FDA approved for NASAL specimens only), is one component of a comprehensive MRSA colonization surveillance program. It is not intended to diagnose MRSA infection nor to guide or monitor treatment for MRSA infections. Performed at [facility], [place] [address], [place], [place] [address]   Surgical PCR screen     Status: None   Collection Time: 09/29/17 10:21 PM  Result Value Ref Range Status   MRSA, PCR NEGATIVE NEGATIVE Final   Staphylococcus aureus NEGATIVE NEGATIVE Final    Comment: (NOTE) The Xpert SA Assay (FDA approved for NASAL specimens in patients 56 years of age and older), is one component of a comprehensive surveillance program. It is not intended to diagnose infection nor to guide or monitor treatment. Performed at [facility], [place] [address], [place], [place] [address]   Culture, blood (routine x 2)     Status: None (Preliminary result)   Collection Time: 10/01/17  7:09 AM  Result Value Ref Range Status   Specimen Description   Final    BLOOD RIGHT HAND Performed at [facility], [place] [address] [place], [place] [address]    Special Requests   Final    BOTTLES DRAWN AEROBIC AND ANAEROBIC Blood Culture adequate volume Performed at [place] [address], [place], [place] [address]    Culture   Final    NO GROWTH 1 DAY Performed at [facility], [place] [address], [place], [place] [address]    Report Status PENDING  Incomplete  Culture, blood (routine x 2)     Status: None (Preliminary result)   Collection Time: 10/01/17  7:11 AM  Result Value Ref Range Status   Specimen Description   Final    BLOOD LEFT FOREARM Performed at [facility] [address], [place], [place] [address]    Special Requests   Final    BOTTLES DRAWN AEROBIC AND ANAEROBIC Blood Culture adequate volume Performed at [place] [address], [place], [place] [address]    Culture   Final    NO GROWTH 1 DAY Performed at [facility], [place] [address], [place], [place] [address]    Report Status PENDING  Incomplete      Studies: No results found.  Scheduled Meds: . enoxaparin (LOVENOX) injection  40 mg Subcutaneous QHS  . nicotine  21 mg Transdermal Daily  . pantoprazole  40 mg Oral Daily    Continuous Infusions:   LOS: 4 days     Alma Friendly, MD [facility]  If 7PM-7AM, please contact night-coverage [email or web address] Password [facility] 10/02/2017, 6:24 PM

## 2017-10-02 NOTE — Progress Notes (Signed)
[facility] Progress Note  2 Days Post-Op  Subjective: CC:  Reports mild abdominal soreness but complains mostly of chronic R shoulder pain. Tolerating PO without N/V. Having flatus. Denies urinary sxs including dysuria. Does report night sweats but states he always sweats at night and his hospital room has been hot. Reports coughing up some phlegm today but denies SOB. Pulling >1250 on IS.   Objective: Vital signs in last 24 hours: Temp:  [98.5 F (36.9 C)-99.8 F (37.7 C)] 98.5 F (36.9 C) (05/29 0549) Pulse Rate:  [71-90] 85 (05/29 0549) Resp:  [16-20] 20 (05/29 0549) BP: (133-157)/(75-77) 157/76 (05/29 0549) SpO2:  [94 %-98 %] 96 % (05/29 0549) Weight:  [87.1 kg (192 lb 0.3 oz)] 87.1 kg (192 lb 0.3 oz) (05/29 0548) Last BM Date: 09/29/17  Intake/Output from previous day: 05/28 0701 - 05/29 0700 In: 120 [P.O.:120] Out: 600 [Urine:600] Intake/Output this shift: Total I/O In: -  Out: 200 [Urine:200]  PE: Gen:  Alert, NAD, pleasant and cooperative Card:  Regular rate and rhythm Pulm: Normal effort, lungs CTAB  Abd: Soft, non-tender, non-distended, +BS, incisions C/D/I, no active bleeding or drainage. Psych: A&Ox3    Lab Results:  Recent Labs    10/02/17 0539  WBC 22.6*  HGB 13.3  HCT 38.3*  PLT 282   BMET Recent Labs    10/01/17 0524 10/02/17 0539  NA 138 134*  K 3.8 3.6  CL 104 97*  CO2 22 24  GLUCOSE 118* 145*  BUN 15 15  CREATININE 1.16 1.17  CALCIUM 9.0 9.2   PT/INR No results for input(s): LABPROT, INR in the last 72 hours. CMP     Component Value Date/Time   NA 134 (L) 10/02/2017 0539   K 3.6 10/02/2017 0539   CL 97 (L) 10/02/2017 0539   CO2 24 10/02/2017 0539   GLUCOSE 145 (H) 10/02/2017 0539   BUN 15 10/02/2017 0539   CREATININE 1.17 10/02/2017 0539   CALCIUM 9.2 10/02/2017 0539   PROT 7.2 10/01/2017 0524   ALBUMIN 3.7 10/01/2017 0524   AST 83 (H) 10/01/2017 0524   ALT 275 (H) 10/01/2017 0524   ALKPHOS 218 (H)  10/01/2017 0524   BILITOT 1.8 (H) 10/01/2017 0524   GFRNONAA 59 (L) 10/02/2017 0539   GFRAA >60 10/02/2017 0539   Lipase     Component Value Date/Time   LIPASE 40 09/28/2017 1654       Studies/Results: Dg Cholangiogram Operative  Result Date: 09/30/2017 CLINICAL DATA:  Gallstones EXAM: INTRAOPERATIVE CHOLANGIOGRAM TECHNIQUE: Cholangiographic images from the C-arm fluoroscopic device were submitted for interpretation post-operatively. Please see the procedural report for the amount of contrast and the fluoroscopy time utilized. COMPARISON:  None. FINDINGS: Contrast fills the biliary tree and duodenum without filling defects in the common bile duct. IMPRESSION: Patent biliary tree. Electronically Signed   By: Marybelle Killings M.D.   On: 09/30/2017 10:03   Dg Chest Port 1 View  Result Date: 10/01/2017 CLINICAL DATA:  Fever. EXAM: PORTABLE CHEST 1 VIEW COMPARISON:  09/28/2017 FINDINGS: New opacity is seen both lung bases which may be due to atelectasis or infiltrate. No evidence of pleural effusion. Heart size is normal. Aortic atherosclerosis. IMPRESSION: New mild bibasilar atelectasis versus infiltrates. Electronically Signed   By: Earle Gell M.D.   On: 10/01/2017 07:41    Anti-infectives: Anti-infectives (From admission, onward)   Start     Dose/Rate Route Frequency Ordered Stop   09/30/17 0600  ceFAZolin (ANCEF) IVPB 2g/100 mL premix  Status:  Discontinued     2 g 200 mL/hr over 30 Minutes Intravenous 90 min pre-op 09/29/17 1034 09/30/17 1123     Assessment/Plan HF w/ pEF HTN Chronic pain Tobacco abuse  Biliary colic   S/P laparoscopic cholecystectomy w/ IOC 09/30/17 Dr. Excell Seltzer -  POD#2 -  Had a fever yesterday afternoon, otherwise VSS -  IOC negative, LFTs including total bilirubin trending down 5/28 -  Tolerating low fat diet -  Mobilize and IS -  from a surgical perspective this patient is stable for discharge.   Fever - resolved past 24h; CXR w/ atelectasis vs  infiltrates; UA on admission negative. Now w/ leukocytosis.  FEN: SOFT ID: perioperative ancef; WBC 22.6 today, 12.5 on admission.  VTE: SCD's, lovenox  Foley: none  Follow up: [facility]   Plan: benign abdominal exam, new leukocytosis 22.6. Afebrile. CXR yesterday with new, small BL infiltrate. I do not think the source of this leukocytosis is abdominal. Question new PNA? Will defer sputum cx/treatment to the medical team.     LOS: 4 days    Jill Alexanders , [facility] 10/02/2017, 8:43 AM Pager: [phone] Consults: [phone] Mon-Fri 7:00 am-4:30 pm Sat-Sun 7:00 am-11:30 am

## 2017-10-03 LAB — COMPREHENSIVE METABOLIC PANEL
ALT: 107 U/L — ABNORMAL HIGH (ref 17–63)
AST: 19 U/L (ref 15–41)
Albumin: 3.2 g/dL — ABNORMAL LOW (ref 3.5–5.0)
Alkaline Phosphatase: 150 U/L — ABNORMAL HIGH (ref 38–126)
Anion gap: 13 (ref 5–15)
BUN: 17 mg/dL (ref 6–20)
CO2: 22 mmol/L (ref 22–32)
Calcium: 8.9 mg/dL (ref 8.9–10.3)
Chloride: 97 mmol/L — ABNORMAL LOW (ref 101–111)
Creatinine, Ser: 1.06 mg/dL (ref 0.61–1.24)
GFR calc Af Amer: >60 mL/min (ref >60)
GFR calc non Af Amer: >60 mL/min (ref >60)
Glucose, Bld: 114 mg/dL — ABNORMAL HIGH (ref 65–99)
Potassium: 3.3 mmol/L — ABNORMAL LOW (ref 3.5–5.1)
Sodium: 132 mmol/L — ABNORMAL LOW (ref 135–145)
Total Bilirubin: 1.5 mg/dL — ABNORMAL HIGH (ref 0.3–1.2)
Total Protein: 7 g/dL (ref 6.5–8.1)

## 2017-10-03 LAB — CBC WITH DIFFERENTIAL/PLATELET
Basophils Absolute: 0 10*3/uL (ref 0.0–0.1)
Basophils Relative: 0 %
Eosinophils Absolute: 0.1 10*3/uL (ref 0.0–0.7)
Eosinophils Relative: 0 %
HCT: 34.7 % — ABNORMAL LOW (ref 39.0–52.0)
Hemoglobin: 11.6 g/dL — ABNORMAL LOW (ref 13.0–17.0)
Lymphocytes Relative: 9 %
Lymphs Abs: 1.8 10*3/uL (ref 0.7–4.0)
MCH: 30.3 pg (ref 26.0–34.0)
MCHC: 33.4 g/dL (ref 30.0–36.0)
MCV: 90.6 fL (ref 78.0–100.0)
Monocytes Absolute: 2 10*3/uL — ABNORMAL HIGH (ref 0.1–1.0)
Monocytes Relative: 10 %
Neutro Abs: 15.8 10*3/uL — ABNORMAL HIGH (ref 1.7–7.7)
Neutrophils Relative %: 81 %
Platelets: 310 10*3/uL (ref 150–400)
RBC: 3.83 MIL/uL — ABNORMAL LOW (ref 4.22–5.81)
RDW: 13.9 % (ref 11.5–15.5)
WBC: 19.7 10*3/uL — ABNORMAL HIGH (ref 4.0–10.5)

## 2017-10-03 LAB — PROCALCITONIN: Procalcitonin: 0.37 ng/mL

## 2017-10-03 MED ORDER — LIP MEDEX EX OINT
1.0000 "application " | TOPICAL_OINTMENT | Freq: Two times a day (BID) | CUTANEOUS | Status: DC
  Administered 2017-10-03 – 2017-10-05 (×5): 1 via TOPICAL
  Filled 2017-10-03 (×2): qty 7

## 2017-10-03 MED ORDER — METHOCARBAMOL 500 MG PO TABS
1000.0000 mg | ORAL_TABLET | Freq: Four times a day (QID) | ORAL | Status: DC | PRN
  Administered 2017-10-04: 1000 mg via ORAL
  Filled 2017-10-03: qty 2

## 2017-10-03 MED ORDER — SODIUM CHLORIDE 0.9% FLUSH
3.0000 mL | Freq: Two times a day (BID) | INTRAVENOUS | Status: DC
  Administered 2017-10-03 – 2017-10-04 (×4): 3 mL via INTRAVENOUS

## 2017-10-03 MED ORDER — MAGIC MOUTHWASH
15.0000 mL | Freq: Four times a day (QID) | ORAL | Status: DC | PRN
Start: 2017-10-03 — End: 2017-10-05
  Filled 2017-10-03: qty 15

## 2017-10-03 MED ORDER — LACTATED RINGERS IV BOLUS: 1000.0000 mL | Freq: Three times a day (TID) | INTRAVENOUS | Status: AC | PRN

## 2017-10-03 MED ORDER — ALUM & MAG HYDROXIDE-SIMETH 200-200-20 MG/5ML PO SUSP: 30.0000 mL | Freq: Four times a day (QID) | ORAL | Status: DC | PRN

## 2017-10-03 MED ORDER — SODIUM CHLORIDE 0.9% FLUSH: 3.0000 mL | INTRAVENOUS | Status: DC | PRN

## 2017-10-03 MED ORDER — GUAIFENESIN-DM 100-10 MG/5ML PO SYRP: 10.0000 mL | ORAL_SOLUTION | ORAL | Status: DC | PRN

## 2017-10-03 MED ORDER — DIPHENHYDRAMINE HCL 50 MG/ML IJ SOLN: 12.5000 mg | Freq: Four times a day (QID) | INTRAMUSCULAR | Status: DC | PRN

## 2017-10-03 MED ORDER — MENTHOL 3 MG MT LOZG
1.0000 | LOZENGE | OROMUCOSAL | Status: DC | PRN
  Filled 2017-10-03: qty 9

## 2017-10-03 MED ORDER — HYDROCORTISONE 1 % EX CREA
1.0000 "application " | TOPICAL_CREAM | Freq: Three times a day (TID) | CUTANEOUS | Status: DC | PRN
  Filled 2017-10-03: qty 28

## 2017-10-03 MED ORDER — ENSURE SURGERY PO LIQD
237.0000 mL | Freq: Two times a day (BID) | ORAL | Status: DC
  Administered 2017-10-03 – 2017-10-05 (×5): 237 mL via ORAL
  Filled 2017-10-03 (×6): qty 237

## 2017-10-03 MED ORDER — POTASSIUM CHLORIDE CRYS ER 20 MEQ PO TBCR
30.0000 meq | EXTENDED_RELEASE_TABLET | Freq: Two times a day (BID) | ORAL | Status: AC
  Administered 2017-10-03 (×2): 30 meq via ORAL
  Filled 2017-10-03 (×2): qty 1

## 2017-10-03 MED ORDER — METHOCARBAMOL 1000 MG/10ML IJ SOLN
1000.0000 mg | Freq: Four times a day (QID) | INTRAVENOUS | Status: DC | PRN
  Filled 2017-10-03: qty 10

## 2017-10-03 MED ORDER — GABAPENTIN 300 MG PO CAPS
300.0000 mg | ORAL_CAPSULE | Freq: Every day | ORAL | Status: DC
  Administered 2017-10-03 – 2017-10-04 (×2): 300 mg via ORAL
  Filled 2017-10-03 (×2): qty 1

## 2017-10-03 MED ORDER — HYDROCORTISONE 2.5 % RE CREA
1.0000 "application " | TOPICAL_CREAM | Freq: Four times a day (QID) | RECTAL | Status: DC | PRN
  Filled 2017-10-03: qty 28.35

## 2017-10-03 MED ORDER — PHENOL 1.4 % MT LIQD
1.0000 | OROMUCOSAL | Status: DC | PRN
  Filled 2017-10-03: qty 177

## 2017-10-03 MED ORDER — SODIUM CHLORIDE 0.9 % IV SOLN: 250.0000 mL | INTRAVENOUS | Status: DC | PRN

## 2017-10-03 MED ORDER — POLYETHYLENE GLYCOL 3350 17 G PO PACK
17.0000 g | PACK | Freq: Two times a day (BID) | ORAL | Status: DC
  Administered 2017-10-03 – 2017-10-04 (×3): 17 g via ORAL
  Filled 2017-10-03 (×3): qty 1

## 2017-10-03 MED ORDER — PROCHLORPERAZINE EDISYLATE 10 MG/2ML IJ SOLN: 5.0000 mg | INTRAMUSCULAR | Status: DC | PRN

## 2017-10-03 NOTE — Progress Notes (Signed)
PROGRESS NOTE  Adrian Miller DGU:[id number] DOB: [birth year] DOA: 09/28/2017 PCP: Janith Lima, MD  HPI/Recap of past 24 hours: Adrian Miller is a 77 year old male with medical history significant for hypertension, hyperlipidemia, diastolic dysfunction, duodenal ulcer, esophagitis, presents to the ER with complaints of right upper quadrant pain.  In the ED, liver enzymes were found to be elevated.  CT abdomen showed intrahepatic dilatation and cholelithiasis without signs of cholecystitis.  MRCP showed same finding.  GI and general surgery were consulted.  Patient underwent laparoscopic cholecystectomy on 09/30/2017.  On 10/01/2017 patient noted to be febrile, with worsening leukocytosis today.  Today, patient has no new complaints, still with mildly tender abdomen, denies significant cough, dysuria, chest pain, shortness of breath.  Assessment/Plan: Principal Problem:   Acute on chronic cholecystitis s/p lap cholecystecomy 09/30/2017 Active Problems:   Leukocytosis   Benign essential HTN   Esophageal stricture   GERD (gastroesophageal reflux disease)   Elevated liver enzymes   Renal insufficiency   Abdominal pain  Biliary colic/cholelithiasis Status post laparoscopic cholecystectomy on 09/30/2017 Elevated liver enzymes, trending down CT abdomen showed intra-and extrahepatic duct dilatation, cholelithiasis without cholecystitis MRCP showed the same Post op Abd xray showed significant fecal loading in cecum and ascending colon Start bowel regimen  General surgery on board  Postop fever/leukocytosis Unknown etiology Currently afebrile, WBC 22.6--> 19.7 Procalcitonin 0.42-->0.37 BC x2 NGTD UA negative, UC pending Chest x-ray showed possible mild bibasilar atelectasis versus infiltrates (no cough) We will monitor closely, may start antibiotics pending work-up  AKI Resolved status post IV fluids Held diuretics, ARB, resume on discharge  Diastolic heart failure Appears  euvolemic Echo showed EF of 60 to 95%, grade 2 diastolic dysfunction Monitor closely  Hypertension Stable  Tobacco abuse Counseled smoking cessation Continue nicotine patch  Chronic pain syndrome Continue home pain meds Bowel regimen     Code Status: Full  Family Communication: None at bedside  Disposition Plan: Home once stable   Consultants:  General surgery  GI  Procedures:  Laparoscopic cholecystectomy 09/30/2017  Antimicrobials:  None  DVT prophylaxis: Lovenox   Objective: Vitals:   10/03/17 0500 10/03/17 0553 10/03/17 1404 10/03/17 2021  BP:  111/63 115/68 113/71  Pulse:  83 77 94  Resp:  18 16 20   Temp:  99.1 F (37.3 C) 99.8 F (37.7 C) (!) 100.7 F (38.2 C)  TempSrc:  Oral Oral Oral  SpO2:  94% 100% 96%  Weight: 87.4 kg (192 lb 10.9 oz)     Height:        Intake/Output Summary (Last 24 hours) at 10/03/2017 2029 Last data filed at 10/03/2017 1810 Gross per 24 hour  Intake 240 ml  Output 500 ml  Net -260 ml   Filed Weights   10/01/17 0523 10/02/17 0548 10/03/17 0500  Weight: 91.1 kg (200 lb 13.4 oz) 87.1 kg (192 lb 0.3 oz) 87.4 kg (192 lb 10.9 oz)    Exam:   General: NAD  Cardiovascular: S1, S2 present  Respiratory: CTA B  Abdomen: Soft, mildly tender, nondistended, bowel sounds present, incision sites C/D/I  Musculoskeletal: No pedal edema bilaterally  Skin: Normal  Psychiatry: Normal mood   Data Reviewed: CBC: Recent Labs  Lab 09/28/17 1506 09/29/17 0530 10/02/17 0539 10/03/17 0532  WBC 12.5* 9.4 22.6* 19.7*  NEUTROABS  --   --  18.7* 15.8*  HGB 14.4 13.0 13.3 11.6*  HCT 41.3 37.8* 38.3* 34.7*  MCV 90.8 90.9 92.1 90.6  PLT 340 319 282 310  Basic Metabolic Panel: Recent Labs  Lab 09/29/17 0530 09/30/17 0505 10/01/17 0524 10/02/17 0539 10/03/17 0532  NA 139 138 138 134* 132*  K 3.9 3.8 3.8 3.6 3.3*  CL 105 104 104 97* 97*  CO2 24 22 22 24 22   GLUCOSE 108* 88 118* 145* 114*  BUN 22* 18 15 15 17    CREATININE 1.04 1.05 1.16 1.17 1.06  CALCIUM 9.8 9.2 9.0 9.2 8.9   GFR: Estimated Creatinine Clearance: 64.9 mL/min (by C-G formula based on SCr of 1.06 mg/dL). Liver Function Tests: Recent Labs  Lab 09/29/17 0530 09/30/17 0505 10/01/17 0524 10/02/17 0539 10/03/17 0532  AST 447* 196* 83* 39 19  ALT 592* 471* 275* 186* 107*  ALKPHOS 228* 268* 218* 196* 150*  BILITOT 3.7* 2.3* 1.8* 1.7* 1.5*  PROT 6.9 7.4 7.2 7.7 7.0  ALBUMIN 3.5 3.8 3.7 3.8 3.2*   Recent Labs  Lab 09/28/17 1654  LIPASE 40   No results for input(s): AMMONIA in the last 168 hours. Coagulation Profile: No results for input(s): INR, PROTIME in the last 168 hours. Cardiac Enzymes: No results for input(s): CKTOTAL, CKMB, CKMBINDEX, TROPONINI in the last 168 hours. BNP (last 3 results) No results for input(s): PROBNP in the last 8760 hours. HbA1C: No results for input(s): HGBA1C in the last 72 hours. CBG: No results for input(s): GLUCAP in the last 168 hours. Lipid Profile: No results for input(s): CHOL, HDL, LDLCALC, TRIG, CHOLHDL, LDLDIRECT in the last 72 hours. Thyroid Function Tests: No results for input(s): TSH, T4TOTAL, FREET4, T3FREE, THYROIDAB in the last 72 hours. Anemia Panel: No results for input(s): VITAMINB12, FOLATE, FERRITIN, TIBC, IRON, RETICCTPCT in the last 72 hours. Urine analysis:    Component Value Date/Time   COLORURINE AMBER (A) 10/02/2017 2051   APPEARANCEUR CLEAR 10/02/2017 2051   LABSPEC 1.021 10/02/2017 2051   PHURINE 5.0 10/02/2017 2051   GLUCOSEU NEGATIVE 10/02/2017 2051   GLUCOSEU NEGATIVE 03/18/2014 1621   HGBUR SMALL (A) 10/02/2017 2051   BILIRUBINUR NEGATIVE 10/02/2017 2051   BILIRUBINUR small 02/05/2012 1931   KETONESUR 5 (A) 10/02/2017 2051   PROTEINUR NEGATIVE 10/02/2017 2051   UROBILINOGEN 0.2 02/11/2015 0929   NITRITE NEGATIVE 10/02/2017 2051   LEUKOCYTESUR NEGATIVE 10/02/2017 2051   Sepsis Labs: @LABRCNTIP (procalcitonin:4,lacticidven:4)  ) Recent Results  (from the past 240 hour(s))  MRSA PCR Screening     Status: None   Collection Time: 09/29/17  1:32 AM  Result Value Ref Range Status   MRSA by PCR NEGATIVE NEGATIVE Final    Comment:        The GeneXpert MRSA Assay (FDA approved for NASAL specimens only), is one component of a comprehensive MRSA colonization surveillance program. It is not intended to diagnose MRSA infection nor to guide or monitor treatment for MRSA infections. Performed at [facility], [place] [address] [place], [place] [address]   Surgical PCR screen     Status: None   Collection Time: 09/29/17 10:21 PM  Result Value Ref Range Status   MRSA, PCR NEGATIVE NEGATIVE Final   Staphylococcus aureus NEGATIVE NEGATIVE Final    Comment: (NOTE) The Xpert SA Assay (FDA approved for NASAL specimens in patients 53 years of age and older), is one component of a comprehensive surveillance program. It is not intended to diagnose infection nor to guide or monitor treatment. Performed at [facility], [place] [address] [place], [place] [address]   Culture, blood (routine x 2)     Status: None (Preliminary result)  Collection Time: 10/01/17  7:09 AM  Result Value Ref Range Status   Specimen Description   Final    BLOOD RIGHT HAND Performed at [facility], [place] [address], [place], [place] [address]    Special Requests   Final    BOTTLES DRAWN AEROBIC AND ANAEROBIC Blood Culture adequate volume Performed at [place] [address] [place], [place] [address]    Culture   Final    NO GROWTH 2 DAYS Performed at [place] [address] [place], [place] [address]    Report Status PENDING  Incomplete  Culture, blood (routine x 2)     Status: None (Preliminary result)   Collection Time: 10/01/17  7:11 AM  Result Value Ref Range Status   Specimen Description   Final    BLOOD LEFT FOREARM Performed at [facility] [address] [place], [place] [address]    Special Requests   Final    BOTTLES DRAWN AEROBIC AND ANAEROBIC Blood Culture adequate volume Performed at [place] [address] [place], [place] [address]    Culture   Final    NO GROWTH 2 DAYS Performed at [facility] [address], [place], [place] [address]    Report Status PENDING  Incomplete      Studies: Dg Abd 2 Views  Result Date: 10/02/2017 CLINICAL DATA:  Constipation.  Abdominal distention. EXAM: ABDOMEN - 2 VIEW COMPARISON:  None. FINDINGS: There is fecal loading in the cecum and proximal ascending colon. No bowel obstruction. Air-filled mildly prominent loops of colon are seen in the left side of the abdomen. No free air, portal venous gas, or pneumatosis. IMPRESSION: 1. Fecal loading in the cecum and ascending colon. Mildly prominent air-filled loops of colon involving the transverse and proximal descending colon. No evidence of obstruction. Electronically Signed   By: Dorise Bullion III M.D   On: 10/02/2017 20:42    Scheduled Meds: . enoxaparin (LOVENOX) injection  40 mg Subcutaneous QHS  . feeding supplement  237 mL Oral BID BM  . gabapentin  300 mg Oral QHS  . lip balm  1 application Topical BID  . nicotine  21 mg Transdermal Daily  . pantoprazole  40 mg Oral Daily  . polyethylene glycol  17 g Oral BID  . potassium chloride  30 mEq Oral BID  . senna-docusate  1 tablet Oral BID  . sodium chloride flush  3 mL Intravenous Q12H    Continuous Infusions: . sodium chloride    . lactated ringers    . methocarbamol (ROBAXIN)  IV       LOS: 5 days     Alma Friendly, MD [facility]  If 7PM-7AM, please contact night-coverage [email or web address] Password [email or web address] 10/03/2017, 8:29 PM

## 2017-10-03 NOTE — Progress Notes (Signed)
[facility] Progress Note  3 Days Post-Op  Subjective: CC:  C/o chronic R shoulder pain. Denies abd pain. Had a BM. Urinating without issue. Denies cough. Objective: Vital signs in last 24 hours: Temp:  [99.1 F (37.3 C)-100.1 F (37.8 C)] 99.1 F (37.3 C) (05/30 0553) Pulse Rate:  [83-106] 83 (05/30 0553) Resp:  [18-24] 18 (05/30 0553) BP: (104-140)/(63-88) 111/63 (05/30 0553) SpO2:  [94 %-96 %] 94 % (05/30 0553) Weight:  [87.4 kg (192 lb 10.9 oz)] 87.4 kg (192 lb 10.9 oz) (05/30 0500) Last BM Date: 09/29/17  Intake/Output from previous day: 05/29 0701 - 05/30 0700 In: 240 [P.O.:240] Out: 425 [Urine:425] Intake/Output this shift: No intake/output data recorded.  PE: Gen: Alert, NAD, pleasant and cooperative Card: Regular rate and rhythm Pulm: Normal effort, lungs CTAB  QIH:KVQQ, non-tender, non-distended, +BS,incisions C/D/I, no active bleeding, drainage, or erythema. Psych: A&Ox3   Lab Results:  Recent Labs    10/02/17 0539 10/03/17 0532  WBC 22.6* 19.7*  HGB 13.3 11.6*  HCT 38.3* 34.7*  PLT 282 310   BMET Recent Labs    10/02/17 0539 10/03/17 0532  NA 134* 132*  K 3.6 3.3*  CL 97* 97*  CO2 24 22  GLUCOSE 145* 114*  BUN 15 17  CREATININE 1.17 1.06  CALCIUM 9.2 8.9   PT/INR No results for input(s): LABPROT, INR in the last 72 hours. CMP     Component Value Date/Time   NA 132 (L) 10/03/2017 0532   K 3.3 (L) 10/03/2017 0532   CL 97 (L) 10/03/2017 0532   CO2 22 10/03/2017 0532   GLUCOSE 114 (H) 10/03/2017 0532   BUN 17 10/03/2017 0532   CREATININE 1.06 10/03/2017 0532   CALCIUM 8.9 10/03/2017 0532   PROT 7.0 10/03/2017 0532   ALBUMIN 3.2 (L) 10/03/2017 0532   AST 19 10/03/2017 0532   ALT 107 (H) 10/03/2017 0532   ALKPHOS 150 (H) 10/03/2017 0532   BILITOT 1.5 (H) 10/03/2017 0532   GFRNONAA >60 10/03/2017 0532   GFRAA >60 10/03/2017 0532   Lipase     Component Value Date/Time   LIPASE 40 09/28/2017 1654        Studies/Results: Dg Abd 2 Views  Result Date: 10/02/2017 CLINICAL DATA:  Constipation.  Abdominal distention. EXAM: ABDOMEN - 2 VIEW COMPARISON:  None. FINDINGS: There is fecal loading in the cecum and proximal ascending colon. No bowel obstruction. Air-filled mildly prominent loops of colon are seen in the left side of the abdomen. No free air, portal venous gas, or pneumatosis. IMPRESSION: 1. Fecal loading in the cecum and ascending colon. Mildly prominent air-filled loops of colon involving the transverse and proximal descending colon. No evidence of obstruction. Electronically Signed   By: Dorise Bullion III M.D   On: 10/02/2017 20:42    Anti-infectives: Anti-infectives (From admission, onward)   Start     Dose/Rate Route Frequency Ordered Stop   09/30/17 0600  ceFAZolin (ANCEF) IVPB 2g/100 mL premix  Status:  Discontinued     2 g 200 mL/hr over 30 Minutes Intravenous 90 min pre-op 09/29/17 1034 09/30/17 1123     Assessment/Plan HF w/ pEF HTN Chronic pain Tobacco abuse  Biliary colic   S/P laparoscopic cholecystectomy w/ IOC 09/30/17 Dr. Excell Seltzer - POD#3 - VSS - IOC negative, LFTs including total bilirubin trending down - Tolerating low fat diet - Mobilize and IS -  from a surgical perspective this patient is stable for discharge.   Fever - resolved Leukocytosis -  WBC 19 from 22. procalcitonin WNL. CXR w/ atelectasis vs infiltrates; UA negative, blood cultures negative to date.  FEN: SOFT, hypokalemia 3.3 - replete PO  ID: perioperative ancef; WBC 22.6 today, 12.5 on admission.  VTE: SCD's, lovenox  Foley: none  Follow up: [facility] clinic  Plan: fever resolved, leukocytosis improving without abx, no known source. Clinically the patient appears to be recovering from surgery appropriately. He is having bowel function and his pain is controlled. From a surgery perspective this patient is stable for discharge with follow up as above.     LOS: 5 days     Jill Alexanders , [facility] Surgery 10/03/2017, 8:10 AM Pager: [phone] Consults: [phone] Mon-Fri 7:00 am-4:30 pm Sat-Sun 7:00 am-11:30 am

## 2017-10-04 LAB — CBC WITH DIFFERENTIAL/PLATELET
Basophils Absolute: 0 10*3/uL (ref 0.0–0.1)
Basophils Relative: 0 %
Eosinophils Absolute: 0.2 10*3/uL (ref 0.0–0.7)
Eosinophils Relative: 1 %
HCT: 34.8 % — ABNORMAL LOW (ref 39.0–52.0)
Hemoglobin: 12 g/dL — ABNORMAL LOW (ref 13.0–17.0)
Lymphocytes Relative: 14 %
Lymphs Abs: 2.2 10*3/uL (ref 0.7–4.0)
MCH: 31.6 pg (ref 26.0–34.0)
MCHC: 34.5 g/dL (ref 30.0–36.0)
MCV: 91.6 fL (ref 78.0–100.0)
Monocytes Absolute: 1.9 10*3/uL — ABNORMAL HIGH (ref 0.1–1.0)
Monocytes Relative: 13 %
Neutro Abs: 10.8 10*3/uL — ABNORMAL HIGH (ref 1.7–7.7)
Neutrophils Relative %: 72 %
Platelets: 371 10*3/uL (ref 150–400)
RBC: 3.8 MIL/uL — ABNORMAL LOW (ref 4.22–5.81)
RDW: 14.1 % (ref 11.5–15.5)
WBC: 15.1 10*3/uL — ABNORMAL HIGH (ref 4.0–10.5)

## 2017-10-04 LAB — URINE CULTURE: Culture: NO GROWTH

## 2017-10-04 LAB — COMPREHENSIVE METABOLIC PANEL
ALT: 76 U/L — ABNORMAL HIGH (ref 17–63)
AST: 18 U/L (ref 15–41)
Albumin: 3.2 g/dL — ABNORMAL LOW (ref 3.5–5.0)
Alkaline Phosphatase: 138 U/L — ABNORMAL HIGH (ref 38–126)
Anion gap: 11 (ref 5–15)
BUN: 28 mg/dL — ABNORMAL HIGH (ref 6–20)
CO2: 26 mmol/L (ref 22–32)
Calcium: 9 mg/dL (ref 8.9–10.3)
Chloride: 99 mmol/L — ABNORMAL LOW (ref 101–111)
Creatinine, Ser: 1.38 mg/dL — ABNORMAL HIGH (ref 0.61–1.24)
GFR calc Af Amer: 56 mL/min — ABNORMAL LOW (ref >60)
GFR calc non Af Amer: 48 mL/min — ABNORMAL LOW (ref >60)
Glucose, Bld: 94 mg/dL (ref 65–99)
Potassium: 4.3 mmol/L (ref 3.5–5.1)
Sodium: 136 mmol/L (ref 135–145)
Total Bilirubin: 1.6 mg/dL — ABNORMAL HIGH (ref 0.3–1.2)
Total Protein: 7.2 g/dL (ref 6.5–8.1)

## 2017-10-04 LAB — PROCALCITONIN: Procalcitonin: 0.32 ng/mL

## 2017-10-04 MED ORDER — SODIUM CHLORIDE 0.9 % IV SOLN
INTRAVENOUS | Status: DC
  Administered 2017-10-04 (×2): via INTRAVENOUS

## 2017-10-04 NOTE — Care Management Note (Signed)
Case Management Note  Patient Details  Name: Adrian Miller MRN: [id number] Date of Birth: 29-May-1940  Subjective/Objective: PT recc HHC-patient chose [facility] for [place] aware. Await face to face order.                   Action/Plan:d/c home w/HHC.   Expected Discharge Date:  10/02/17               Expected Discharge Plan:  [place]  In-House Referral:     Discharge planning Services  CM Consult  Post Acute Care Choice:    Choice offered to:  Patient  DME Arranged:    DME Agency:     HH Arranged:  PT [place]:  [place]  Status of Service:  In process, will continue to follow  If discussed at Long Length of Stay Meetings, dates discussed:    Additional Comments:  Dessa Phi, RN 10/04/2017, 4:34 PM

## 2017-10-04 NOTE — Evaluation (Signed)
Physical Therapy Evaluation Patient Details Name: SHAMARI LOFQUIST MRN: [id number] DOB: Jul 16, 1940 Today's Date: 10/04/2017   History of Present Illness  77 year old male with medical history significant for hypertension, hyperlipidemia, diastolic dysfunction, duodenal ulcer, esophagitis, presents to the ER with complaints of right upper quadrant pain.  In the ED, liver enzymes were found to be elevated.  CT abdomen showed intrahepatic dilatation and cholelithiasis without signs of cholecystitis.  MRCP showed same finding.  GI and general surgery were consulted.  Patient underwent laparoscopic cholecystectomy on 09/30/2017.  On 10/01/2017 patient noted to be febrile, with worsening leukocytosis   Clinical Impression  Pt admitted with above diagnosis. Pt currently with functional limitations due to the deficits listed below (see PT Problem List). MIn assist for bed mobility and transfers, min assist for mild loss of balance with ambulation, pt ambulated 140' with RW. Pt will benefit from skilled PT to increase their independence and safety with mobility to allow discharge to the venue listed below.       Follow Up Recommendations Home health PT; supervision for mobility    Equipment Recommendations  None recommended by PT    Recommendations for Other Services       Precautions / Restrictions Precautions Precautions: Fall;Other (comment) Precaution Comments: abdominal incision; pt stated he's not sure if he's had falls in the past year Restrictions Weight Bearing Restrictions: No      Mobility  Bed Mobility Overal bed mobility: Needs Assistance Bed Mobility: Rolling;Sidelying to Sit Rolling: Min assist Sidelying to sit: Min assist       General bed mobility comments: instructed pt in log roll to minimize strain to abdominal incision, min A to initiate roll and to raise trunk  Transfers Overall transfer level: Needs assistance Equipment used: Rolling walker (2 wheeled) Transfers: Sit  to/from Stand Sit to Stand: Min assist         General transfer comment: min A to rise  Ambulation/Gait Ambulation/Gait assistance: Supervision Ambulation Distance (Feet): 140 Feet Assistive device: Rolling walker (2 wheeled) Gait Pattern/deviations: Step-through pattern;Decreased stride length Gait velocity: decr   General Gait Details: loss of balance x 1 requiring min A at beginning of walk, otherwise steady with RW  Stairs            Wheelchair Mobility    Modified Rankin (Stroke Patients Only)       Balance Overall balance assessment: Needs assistance   Sitting balance-Leahy Scale: Good     Standing balance support: Bilateral upper extremity supported Standing balance-Leahy Scale: Fair                               Pertinent Vitals/Pain Pain Assessment: 0-10 Pain Score: 7  Pain Location: abdominal incision Pain Descriptors / Indicators: Sore Pain Intervention(s): Limited activity within patient's tolerance;Monitored during session;Premedicated before session    Home Living Family/patient expects to be discharged to:: Private residence Living Arrangements: Alone Available Help at Discharge: Family;Available PRN/intermittently   Home Access: Level entry     Home Layout: One level Home Equipment: Walker - 2 wheels;Bedside commode;Cane - single point;Tub bench      Prior Function Level of Independence: Independent with assistive device(s)         Comments: uses SPC prn      Hand Dominance        Extremity/Trunk Assessment   Upper Extremity Assessment Upper Extremity Assessment: Generalized weakness(pt uses RUE minimally 2* shoulder pain, stated he is  awaiting surgery, could not actively elevate RUE)    Lower Extremity Assessment Lower Extremity Assessment: Overall WFL for tasks assessed    Cervical / Trunk Assessment Cervical / Trunk Assessment: Normal  Communication   Communication: No difficulties  Cognition  Arousal/Alertness: Awake/alert Behavior During Therapy: WFL for tasks assessed/performed Overall Cognitive Status: Within Functional Limits for tasks assessed                                        General Comments      Exercises     Assessment/Plan    PT Assessment Patient needs continued PT services  PT Problem List Decreased activity tolerance       PT Treatment Interventions Gait training;Functional mobility training;Balance training;Patient/family education    PT Goals (Current goals can be found in the Care Plan section)  Acute Rehab PT Goals Patient Stated Goal: return home PT Goal Formulation: With patient Time For Goal Achievement: 10/18/17 Potential to Achieve Goals: Good    Frequency Min 3X/week   Barriers to discharge        Co-evaluation               AM-PAC PT "6 Clicks" Daily Activity  Outcome Measure Difficulty turning over in bed (including adjusting bedclothes, sheets and blankets)?: Unable Difficulty moving from lying on back to sitting on the side of the bed? : Unable Difficulty sitting down on and standing up from a chair with arms (e.g., wheelchair, bedside commode, etc,.)?: Unable Help needed moving to and from a bed to chair (including a wheelchair)?: A Little Help needed walking in hospital room?: A Little Help needed climbing 3-5 steps with a railing? : A Lot 6 Click Score: 11    End of Session Equipment Utilized During Treatment: Gait belt Activity Tolerance: Patient tolerated treatment well Patient left: in chair;with call bell/phone within reach Nurse Communication: Mobility status PT Visit Diagnosis: Unsteadiness on feet (R26.81);Difficulty in walking, not elsewhere classified (R26.2)    Time: 5361-4431 PT Time Calculation (min) (ACUTE ONLY): 19 min   Charges:   PT Evaluation $PT Eval Low Complexity: 1 Low     PT G Codes:          Philomena Doheny 10/04/2017, 11:20 AM [phone]

## 2017-10-04 NOTE — Progress Notes (Signed)
PROGRESS NOTE  Adrian Miller WYO:[id number] DOB: 1940-10-15 DOA: 09/28/2017 PCP: Janith Lima, MD  HPI/Recap of past 24 hours: Adrian Miller is a 77 year old male with medical history significant for hypertension, hyperlipidemia, diastolic dysfunction, duodenal ulcer, esophagitis, presents to the ER with complaints of right upper quadrant pain.  In the ED, liver enzymes were found to be elevated.  CT abdomen showed intrahepatic dilatation and cholelithiasis without signs of cholecystitis.  MRCP showed same finding.  GI and general surgery were consulted.  Patient underwent laparoscopic cholecystectomy on 09/30/2017.  On 10/01/2017 patient noted to be febrile, with worsening leukocytosis today.  Today, patient has no new complaints, still with mildly tender abdomen, denies significant cough, dysuria, chest pain, shortness of breath.  Assessment/Plan: Principal Problem:   Acute on chronic cholecystitis s/p lap cholecystecomy 09/30/2017 Active Problems:   Leukocytosis   Benign essential HTN   Esophageal stricture   GERD (gastroesophageal reflux disease)   Elevated liver enzymes   Renal insufficiency   Abdominal pain  Biliary colic/cholelithiasis Status post laparoscopic cholecystectomy on 09/30/2017 Elevated liver enzymes, trending down CT abdomen showed intra-and extrahepatic duct dilatation, cholelithiasis without cholecystitis MRCP showed the same Post op Abd xray showed significant fecal loading in cecum and ascending colon Start bowel regimen  General surgery on board  Postop fever/leukocytosis Unknown etiology Had a temp spike on 10/03/17, Currently afebrile, WBC 22.6--> 19.7-->15.1 Procalcitonin 0.42-->0.37 BC x2 NGTD UA negative, UC no growth Chest x-ray showed possible mild bibasilar atelectasis versus infiltrates (no cough) We will monitor closely, may start antibiotics pending work-up  AKI Ongoing Restarted IV fluids Held diuretics, ARB, resume on discharge  Diastolic  heart failure Appears euvolemic Echo showed EF of 60 to 77%, grade 2 diastolic dysfunction Monitor closely  Hypertension Stable  Tobacco abuse Counseled smoking cessation Continue nicotine patch  Chronic pain syndrome Continue home pain meds Bowel regimen     Code Status: Full  Family Communication: None at bedside  Disposition Plan: Home once stable   Consultants:  General surgery  GI  Procedures:  Laparoscopic cholecystectomy 09/30/2017  Antimicrobials:  None  DVT prophylaxis: Lovenox   Objective: Vitals:   10/04/17 0522 10/04/17 0735 10/04/17 1354 10/04/17 2110  BP: 123/70  115/61 (!) 111/58  Pulse: 81  86 65  Resp: 20  18 16   Temp: 98.2 F (36.8 C)  99.2 F (37.3 C) 98.8 F (37.1 C)  TempSrc: Oral  Oral Oral  SpO2: 93%  91% 94%  Weight:  86.7 kg (191 lb 2.2 oz)    Height:        Intake/Output Summary (Last 24 hours) at 10/04/2017 2231 Last data filed at 10/04/2017 1805 Gross per 24 hour  Intake 1390 ml  Output 100 ml  Net 1290 ml   Filed Weights   10/02/17 0548 10/03/17 0500 10/04/17 0735  Weight: 87.1 kg (192 lb 0.3 oz) 87.4 kg (192 lb 10.9 oz) 86.7 kg (191 lb 2.2 oz)    Exam:   General: NAD  Cardiovascular: S1, S2 present  Respiratory: CTA B  Abdomen: Soft, mildly tender, nondistended, bowel sounds present, incision sites C/D/I  Musculoskeletal: No pedal edema bilaterally  Skin: Normal  Psychiatry: Normal mood   Data Reviewed: CBC: Recent Labs  Lab 09/28/17 1506 09/29/17 0530 10/02/17 0539 10/03/17 0532 10/04/17 0548  WBC 12.5* 9.4 22.6* 19.7* 15.1*  NEUTROABS  --   --  18.7* 15.8* 10.8*  HGB 14.4 13.0 13.3 11.6* 12.0*  HCT 41.3 37.8* 38.3* 34.7* 34.8*  MCV 90.8 90.9 92.1 90.6 91.6  PLT 340 319 282 310 323   Basic Metabolic Panel: Recent Labs  Lab 09/30/17 0505 10/01/17 0524 10/02/17 0539 10/03/17 0532 10/04/17 0548  NA 138 138 134* 132* 136  K 3.8 3.8 3.6 3.3* 4.3  CL 104 104 97* 97* 99*  CO2 22  22 24 22 26   GLUCOSE 88 118* 145* 114* 94  BUN 18 15 15 17  28*  CREATININE 1.05 1.16 1.17 1.06 1.38*  CALCIUM 9.2 9.0 9.2 8.9 9.0   GFR: Estimated Creatinine Clearance: 49.7 mL/min (A) (by C-G formula based on SCr of 1.38 mg/dL (H)). Liver Function Tests: Recent Labs  Lab 09/30/17 0505 10/01/17 0524 10/02/17 0539 10/03/17 0532 10/04/17 0548  AST 196* 83* 39 19 18  ALT 471* 275* 186* 107* 76*  ALKPHOS 268* 218* 196* 150* 138*  BILITOT 2.3* 1.8* 1.7* 1.5* 1.6*  PROT 7.4 7.2 7.7 7.0 7.2  ALBUMIN 3.8 3.7 3.8 3.2* 3.2*   Recent Labs  Lab 09/28/17 1654  LIPASE 40   No results for input(s): AMMONIA in the last 168 hours. Coagulation Profile: No results for input(s): INR, PROTIME in the last 168 hours. Cardiac Enzymes: No results for input(s): CKTOTAL, CKMB, CKMBINDEX, TROPONINI in the last 168 hours. BNP (last 3 results) No results for input(s): PROBNP in the last 8760 hours. HbA1C: No results for input(s): HGBA1C in the last 72 hours. CBG: No results for input(s): GLUCAP in the last 168 hours. Lipid Profile: No results for input(s): CHOL, HDL, LDLCALC, TRIG, CHOLHDL, LDLDIRECT in the last 72 hours. Thyroid Function Tests: No results for input(s): TSH, T4TOTAL, FREET4, T3FREE, THYROIDAB in the last 72 hours. Anemia Panel: No results for input(s): VITAMINB12, FOLATE, FERRITIN, TIBC, IRON, RETICCTPCT in the last 72 hours. Urine analysis:    Component Value Date/Time   COLORURINE AMBER (A) 10/02/2017 2051   APPEARANCEUR CLEAR 10/02/2017 2051   LABSPEC 1.021 10/02/2017 2051   PHURINE 5.0 10/02/2017 2051   GLUCOSEU NEGATIVE 10/02/2017 2051   GLUCOSEU NEGATIVE 03/18/2014 1621   HGBUR SMALL (A) 10/02/2017 2051   BILIRUBINUR NEGATIVE 10/02/2017 2051   BILIRUBINUR small 02/05/2012 1931   KETONESUR 5 (A) 10/02/2017 2051   PROTEINUR NEGATIVE 10/02/2017 2051   UROBILINOGEN 0.2 02/11/2015 0929   NITRITE NEGATIVE 10/02/2017 2051   LEUKOCYTESUR NEGATIVE 10/02/2017 2051    Sepsis Labs: @LABRCNTIP (procalcitonin:4,lacticidven:4)  ) Recent Results (from the past 240 hour(s))  MRSA PCR Screening     Status: None   Collection Time: 09/29/17  1:32 AM  Result Value Ref Range Status   MRSA by PCR NEGATIVE NEGATIVE Final    Comment:        The GeneXpert MRSA Assay (FDA approved for NASAL specimens only), is one component of a comprehensive MRSA colonization surveillance program. It is not intended to diagnose MRSA infection nor to guide or monitor treatment for MRSA infections. Performed at [facility], [place] [address] [place], [place] [address]   Surgical PCR screen     Status: None   Collection Time: 09/29/17 10:21 PM  Result Value Ref Range Status   MRSA, PCR NEGATIVE NEGATIVE Final   Staphylococcus aureus NEGATIVE NEGATIVE Final    Comment: (NOTE) The Xpert SA Assay (FDA approved for NASAL specimens in patients 65 years of age and older), is one component of a comprehensive surveillance program. It is not intended to diagnose infection nor to guide or monitor treatment. Performed at [facility], [place] [address] [place],  [place] [address]   Culture, blood (routine x 2)     Status: None (Preliminary result)   Collection Time: 10/01/17  7:09 AM  Result Value Ref Range Status   Specimen Description   Final    BLOOD RIGHT HAND Performed at [place] [address], [place], [place] [address]    Special Requests   Final    BOTTLES DRAWN AEROBIC AND ANAEROBIC Blood Culture adequate volume Performed at [place] [address] [place], [place] [address]    Culture   Final    NO GROWTH 3 DAYS Performed at [facility], [place] [address] [place], [place] [address]    Report Status PENDING  Incomplete  Culture, blood (routine x 2)     Status: None (Preliminary result)   Collection Time: 10/01/17  7:11 AM  Result Value Ref Range Status   Specimen  Description   Final    BLOOD LEFT FOREARM Performed at [place] [address], [place], [place] [address]    Special Requests   Final    BOTTLES DRAWN AEROBIC AND ANAEROBIC Blood Culture adequate volume Performed at [place] [address], [place], [place] [address]    Culture   Final    NO GROWTH 3 DAYS Performed at [facility], [place] [address] [place], [place] [address]    Report Status PENDING  Incomplete  Urine Culture     Status: None   Collection Time: 10/02/17  8:51 PM  Result Value Ref Range Status   Specimen Description   Final    URINE, CLEAN CATCH Performed at [facility], [place] [address] [place], [place] [address]    Special Requests   Final    NONE Performed at [facility], [place] [address] [place], [place] [address]    Culture   Final    NO GROWTH Performed at [facility], [place] [address] [place],  [address]    Report Status 10/04/2017 FINAL  Final      Studies: No results found.  Scheduled Meds: . enoxaparin (LOVENOX) injection  40 mg Subcutaneous QHS  . feeding supplement  237 mL Oral BID BM  . gabapentin  300 mg Oral QHS  . lip balm  1 application Topical BID  . nicotine  21 mg Transdermal Daily  . pantoprazole  40 mg Oral Daily  . polyethylene glycol  17 g Oral BID  . senna-docusate  1 tablet Oral BID  . sodium chloride flush  3 mL Intravenous Q12H    Continuous Infusions: . sodium chloride    . sodium chloride 100 mL/hr at 10/04/17 2113  . lactated ringers    . methocarbamol (ROBAXIN)  IV       LOS: 6 days     Alma Friendly, MD [facility]  If 7PM-7AM, please contact night-coverage [email or web address] Password [facility] 10/04/2017, 10:31 PM

## 2017-10-05 DIAGNOSIS — K812 Acute cholecystitis with chronic cholecystitis: Secondary | ICD-10-CM

## 2017-10-05 LAB — COMPREHENSIVE METABOLIC PANEL
ALT: 56 U/L (ref 17–63)
AST: 20 U/L (ref 15–41)
Albumin: 3 g/dL — ABNORMAL LOW (ref 3.5–5.0)
Alkaline Phosphatase: 122 U/L (ref 38–126)
Anion gap: 9 (ref 5–15)
BUN: 30 mg/dL — ABNORMAL HIGH (ref 6–20)
CO2: 22 mmol/L (ref 22–32)
Calcium: 8.7 mg/dL — ABNORMAL LOW (ref 8.9–10.3)
Chloride: 103 mmol/L (ref 101–111)
Creatinine, Ser: 1.2 mg/dL (ref 0.61–1.24)
GFR calc Af Amer: >60 mL/min (ref >60)
GFR calc non Af Amer: 57 mL/min — ABNORMAL LOW (ref >60)
Glucose, Bld: 117 mg/dL — ABNORMAL HIGH (ref 65–99)
Potassium: 3.6 mmol/L (ref 3.5–5.1)
Sodium: 134 mmol/L — ABNORMAL LOW (ref 135–145)
Total Bilirubin: 1.2 mg/dL (ref 0.3–1.2)
Total Protein: 7.1 g/dL (ref 6.5–8.1)

## 2017-10-05 LAB — CBC WITH DIFFERENTIAL/PLATELET
Basophils Absolute: 0 10*3/uL (ref 0.0–0.1)
Basophils Relative: 0 %
Eosinophils Absolute: 0.3 10*3/uL (ref 0.0–0.7)
Eosinophils Relative: 3 %
HCT: 30.9 % — ABNORMAL LOW (ref 39.0–52.0)
Hemoglobin: 10.6 g/dL — ABNORMAL LOW (ref 13.0–17.0)
Lymphocytes Relative: 17 %
Lymphs Abs: 1.7 10*3/uL (ref 0.7–4.0)
MCH: 31.5 pg (ref 26.0–34.0)
MCHC: 34.3 g/dL (ref 30.0–36.0)
MCV: 92 fL (ref 78.0–100.0)
Monocytes Absolute: 1.3 10*3/uL — ABNORMAL HIGH (ref 0.1–1.0)
Monocytes Relative: 13 %
Neutro Abs: 6.5 10*3/uL (ref 1.7–7.7)
Neutrophils Relative %: 67 %
Platelets: 345 10*3/uL (ref 150–400)
RBC: 3.36 MIL/uL — ABNORMAL LOW (ref 4.22–5.81)
RDW: 14 % (ref 11.5–15.5)
WBC: 9.9 10*3/uL (ref 4.0–10.5)

## 2017-10-05 MED ORDER — POLYETHYLENE GLYCOL 3350 17 G PO PACK: 17.0000 g | PACK | Freq: Two times a day (BID) | ORAL | Status: DC | PRN

## 2017-10-05 MED ORDER — BISACODYL 10 MG RE SUPP: 10.0000 mg | Freq: Two times a day (BID) | RECTAL | Status: DC | PRN

## 2017-10-05 MED ORDER — NICOTINE 21 MG/24HR TD PT24: 21.0000 mg | MEDICATED_PATCH | Freq: Every day | TRANSDERMAL | 0 refills | Status: DC

## 2017-10-05 MED ORDER — PSYLLIUM 95 % PO PACK
1.0000 | PACK | Freq: Two times a day (BID) | ORAL | Status: DC
  Filled 2017-10-05: qty 1

## 2017-10-05 MED ORDER — SENNOSIDES-DOCUSATE SODIUM 8.6-50 MG PO TABS: 1.0000 | ORAL_TABLET | Freq: Every evening | ORAL | 0 refills | Status: DC | PRN

## 2017-10-05 MED ORDER — HYDROCORTISONE 2.5 % RE CREA: 1.0000 "application " | TOPICAL_CREAM | Freq: Four times a day (QID) | RECTAL | 0 refills | Status: DC | PRN

## 2017-10-05 MED ORDER — ACETAMINOPHEN 500 MG PO TABS
1000.0000 mg | ORAL_TABLET | Freq: Three times a day (TID) | ORAL | Status: DC
  Administered 2017-10-05: 1000 mg via ORAL
  Filled 2017-10-05: qty 2

## 2017-10-05 MED ORDER — POLYETHYLENE GLYCOL 3350 17 G PO PACK: 34.0000 g | PACK | Freq: Two times a day (BID) | ORAL | Status: DC | PRN

## 2017-10-05 MED ORDER — POLYETHYLENE GLYCOL 3350 17 G PO PACK: 17.0000 g | PACK | Freq: Every day | ORAL | 0 refills | Status: DC

## 2017-10-05 NOTE — Discharge Summary (Signed)
Discharge Summary  Adrian Miller WUJ:[id number] DOB: 12-May-1940  PCP: Janith Lima, MD  Admit date: 09/28/2017 Discharge date: 10/05/2017  Time spent: 40 mins  Recommendations for Outpatient Follow-up:  1. PCP 2. General surgery  Discharge Diagnoses:  Active Hospital Problems   Diagnosis Date Noted  . Acute on chronic cholecystitis s/p lap cholecystecomy 09/30/2017 01/16/2016  . Renal insufficiency 09/29/2017  . Abdominal pain 09/29/2017  . Elevated liver enzymes 09/28/2017  . GERD (gastroesophageal reflux disease) 12/19/2015  . Esophageal stricture 06/01/2015  . Benign essential HTN 05/31/2015  . Leukocytosis 05/31/2015    Resolved Hospital Problems  No resolved problems to display.    Discharge Condition: Stable  Diet recommendation: Heart healthy  Vitals:   10/04/17 2110 10/05/17 0343  BP: (!) 111/58 120/75  Pulse: 65 63  Resp: 16 20  Temp: 98.8 F (37.1 C) 97.9 F (36.6 C)  SpO2: 94% 96%    History of present illness:  Adrian Miller is a 77 year old male with medical history significant for hypertension, hyperlipidemia, diastolic dysfunction, duodenal ulcer, esophagitis, presents to the ER with complaints of right upper quadrant pain.  In the ED, liver enzymes were found to be elevated.  CT abdomen showed intrahepatic dilatation and cholelithiasis without signs of cholecystitis.  MRCP showed same finding.  GI and general surgery were consulted.  Patient underwent laparoscopic cholecystectomy on 09/30/2017.  On 10/01/2017 patient noted to be febrile, with worsening leukocytosis. No source of infection found, but patient was noted to be severely constipated with rounds of good BM after laxatives given.   Today, patient has no new complaints, feels a lot better, denies worsening abdominal pain, no significant cough, dysuria, chest pain, shortness of breath. Stable for discharge.   Hospital Course:  Principal Problem:   Acute on chronic cholecystitis s/p lap  cholecystecomy 09/30/2017 Active Problems:   Leukocytosis   Benign essential HTN   Esophageal stricture   GERD (gastroesophageal reflux disease)   Elevated liver enzymes   Renal insufficiency   Abdominal pain  Biliary colic/cholelithiasis Status post laparoscopic cholecystectomy on 09/30/2017 Elevated liver enzymes, trended down CT abdomen showed intra-and extrahepatic duct dilatation, cholelithiasis without cholecystitis MRCP showed the same as CT Post op Abd xray showed significant fecal loading in cecum and ascending colon s/p laxatives with good BM Follow up with general surgery, appt scheduled  Postop fever/leukocytosis Unknown etiology, ??severe constipation Had a temp spike on 10/03/17, Currently afebrile, WBC 22.6--> 19.7-->15.1-->9.9 Procalcitonin 0.42-->0.37 BC x2 NGTD UA negative, UC no growth Chest x-ray showed possible mild bibasilar atelectasis versus infiltrates (no cough), likely atelectasis given recent surgery. No antibiotic started PCP to follow up with repeat labs/imaging  Severe constipation likely 2/2 to chronic opioid use Resolving (has had multiple BM after laxatives) D/C of laxatives as pt in on norco at home  AKI Improving, s/p IVFs PCP to follow up with repeat labs  Diastolic heart failure Appears euvolemic Echo showed EF of 60 to 95%, grade 2 diastolic dysfunction Monitor closely  Hypertension Stable  Tobacco abuse Counseled smoking cessation Continue nicotine patch  Chronic pain syndrome Continue home pain meds Bowel regimen     Procedures:  Laparoscopic cholecystectomy 09/30/2017  Consultations:  Gen surgery  GI  Discharge Exam: BP 120/75 (BP Location: Right Arm)   Pulse 63   Temp 97.9 F (36.6 C) (Oral)   Resp 20   Ht 5\' 9"  (1.753 m)   Wt 86.7 kg (191 lb 2.2 oz)   SpO2 96%   BMI 28.23  kg/m   General: NAD Cardiovascular: S1, S2 present Respiratory: CTAB   Discharge Instructions You were cared for by a  hospitalist during your hospital stay. If you have any questions about your discharge medications or the care you received while you were in the hospital after you are discharged, you can call the unit and asked to speak with the hospitalist on call if the hospitalist that took care of you is not available. Once you are discharged, your primary care physician will handle any further medical issues. Please note that NO REFILLS for any discharge medications will be authorized once you are discharged, as it is imperative that you return to your primary care physician (or establish a relationship with a primary care physician if you do not have one) for your aftercare needs so that they can reassess your need for medications and monitor your lab values.  Discharge Instructions    Diet - low sodium heart healthy   Complete by:  As directed    Increase activity slowly   Complete by:  As directed      Allergies as of 10/05/2017      Reactions   Asa [aspirin]    angioedema      Medication List    STOP taking these medications   cyanocobalamin 2000 MCG tablet     TAKE these medications   atorvastatin 20 MG tablet Commonly known as:  LIPITOR Take 1 tablet (20 mg total) by mouth daily.   chlorthalidone 25 MG tablet Commonly known as:  HYGROTON TAKE 1 TABLET BY MOUTH EVERY DAY   ferrous sulfate 325 (65 FE) MG tablet Take 1 tablet (325 mg total) by mouth 3 (three) times daily with meals.   furosemide 20 MG tablet Commonly known as:  LASIX Take 1 tablet (20 mg total) by mouth daily as needed for edema.   gabapentin 100 MG capsule Commonly known as:  NEURONTIN Take 2 capsules (200 mg total) by mouth at bedtime.   hydrocortisone 2.5 % rectal cream Commonly known as:  ANUSOL-HC Apply 1 application topically 4 (four) times daily as needed for hemorrhoids.   irbesartan 300 MG tablet Commonly known as:  AVAPRO Take 1 tablet (300 mg total) by mouth daily.   nicotine 21 mg/24hr  patch Commonly known as:  NICODERM CQ - dosed in mg/24 hours Place 1 patch (21 mg total) onto the skin daily. Start taking on:  10/06/2017   oxyCODONE-acetaminophen 10-325 MG tablet Commonly known as:  PERCOCET Take 1 tablet by mouth every 8 (eight) hours as needed for pain.   pantoprazole 40 MG tablet Commonly known as:  PROTONIX Take 1 tablet (40 mg total) by mouth daily.   polyethylene glycol packet Commonly known as:  MIRALAX / GLYCOLAX Take 17 g by mouth daily.   senna-docusate 8.6-50 MG tablet Commonly known as:  Senokot-S Take 1 tablet by mouth at bedtime as needed for mild constipation.      Allergies  Allergen Reactions  . Asa [Aspirin]     angioedema   Follow-up [facility], [place]. Go on 10/15/2017.   Specialty:  General Surgery Why:  at 10:15 Am for post-operative follow up. please arrive by 9:45 to get checked in and fill out any necessary paperwork. Contact information: [address] [place] [phone]       Janith Lima, MD. Schedule an appointment as soon as possible for a visit in 1 week(s).  Specialty:  Internal Medicine Contact information: [address] [place] [address] [phone]            The results of significant diagnostics from this hospitalization (including imaging, microbiology, ancillary and laboratory) are listed below for reference.    Significant Diagnostic Studies: Dg Cholangiogram Operative  Result Date: 09/30/2017 CLINICAL DATA:  Gallstones EXAM: INTRAOPERATIVE CHOLANGIOGRAM TECHNIQUE: Cholangiographic images from the C-arm fluoroscopic device were submitted for interpretation post-operatively. Please see the procedural report for the amount of contrast and the fluoroscopy time utilized. COMPARISON:  None. FINDINGS: Contrast fills the biliary tree and duodenum without filling defects in the common bile duct. IMPRESSION: Patent  biliary tree. Electronically Signed   By: Marybelle Killings M.D.   On: 09/30/2017 10:03   Ct Abdomen Pelvis W Contrast  Result Date: 09/28/2017 CLINICAL DATA:  Patient does have history of ulcer disease and acute esophagitis. He says this feels different than that. He reports about 4 AM he woke up and that pain is been continued since then. Patient took his chronic pain medication at home and did not get better. On arrival patient had a 10 out of 10 pain, however it is since feels better after lying still EXAM: CT ABDOMEN AND PELVIS WITH CONTRAST TECHNIQUE: Multidetector CT imaging of the abdomen and pelvis was performed using the standard protocol following bolus administration of intravenous contrast. CONTRAST:  <See Chart> ISOVUE-300 IOPAMIDOL (ISOVUE-300) INJECTION 61% COMPARISON:  None. FINDINGS: Lower chest: No acute abnormality. Hepatobiliary: Liver normal in size and attenuation. No mass or focal lesions. There is mild intra and extrahepatic bile duct dilation. Common bile duct measures 1 cm proximally, but shows normal distal tapering. No evidence of a duct stone. Gallbladder is moderately distended. There are gallstones. No wall thickening. No adjacent inflammation. Pancreas: Unremarkable. No pancreatic ductal dilatation or surrounding inflammatory changes. Spleen: Normal in size without focal abnormality. Adrenals/Urinary Tract: No adrenal masses. Multiple bilateral low-density nonenhancing renal masses, largest arising from the anteromedial mid to lower pole of the left kidney measuring 5.1 cm. These are all consistent with cysts. No renal stones. No hydronephrosis. Ureters are normal course and in caliber. Bladder is unremarkable. Stomach/Bowel: Stomach is unremarkable. Small bowel is normal caliber with no wall thickening or inflammation. There are numerous colonic diverticula. No evidence of diverticulitis or other colon inflammatory process. Appendix is normal. Vascular/Lymphatic: There are several  prominent to mildly enlarged gastrohepatic ligament lymph nodes, largest measuring 11 mm in short axis. No other adenopathy. Atherosclerotic disease noted along the abdominal aorta and iliac vessels. No aneurysm. Reproductive: Prostate is mildly enlarged measuring 4.8 cm maximum transverse dimension. Other: No abdominal wall hernia or abnormality. No abdominopelvic ascites. Musculoskeletal: No fracture or acute finding. No osteoblastic or osteolytic lesions. There are significant degenerative changes of the mid to lower lumbar spine. IMPRESSION: 1. No acute findings within the abdomen or pelvis. 2. Extensive colonic diverticulosis with no evidence of diverticulitis. 3. Mild intra and extrahepatic bile duct dilation. No evidence bile duct obstruction. This is most likely chronic. 4. Gallstones.  No acute cholecystitis. 5. Multiple renal cysts. 6. Aortic atherosclerosis. Electronically Signed   By: Lajean Manes M.D.   On: 09/28/2017 17:29   Mr 3d Recon At Scanner  Result Date: 09/28/2017 CLINICAL DATA:  Abdominal pain. Abnormal liver function studies. Epigastric pain and vomiting. EXAM: MRI ABDOMEN WITHOUT AND WITH CONTRAST (INCLUDING MRCP) TECHNIQUE: Multiplanar multisequence MR imaging of the abdomen was performed both before and after the administration of intravenous  contrast. Heavily T2-weighted images of the biliary and pancreatic ducts were obtained, and three-dimensional MRCP images were rendered by post processing. CONTRAST:  70mL MULTIHANCE GADOBENATE DIMEGLUMINE 529 MG/ML IV SOLN COMPARISON:  CT abdomen and pelvis 09/28/2017 FINDINGS: Examination is technically limited due to motion artifact. Many sequences are nondiagnostic due to motion artifact. Lower chest: No specific acute abnormality identified. Hepatobiliary: Cholelithiasis with multiple large stones in the gallbladder. No inflammatory changes to suggest cholecystitis. Mild bile duct dilatation is demonstrated. No obstructing mass lesion or  stone is identified. No focal liver lesions. Pancreas: No mass, inflammatory changes, or other parenchymal abnormality identified. No pancreatic ductal dilatation. Spleen:  Within normal limits in size and appearance. Adrenals/Urinary Tract: No adrenal gland nodules. Multiple bilateral renal parenchymal cysts. No hydronephrosis or solid mass identified. Stomach/Bowel: Visualized stomach, small bowel, and colon are not abnormally distended and no wall thickening is appreciated. Vascular/Lymphatic: Normal caliber abdominal aorta. No significant lymphadenopathy. Other:  None. Musculoskeletal: No suspicious bone lesions identified. IMPRESSION: 1. Cholelithiasis with large stones in the gallbladder. No evidence of cholecystitis. 2. Mild bile duct dilatation.  No cause identified. 3. Bilateral renal cysts.  No hydronephrosis. 4. Examination is technically limited due to motion artifact. Some sequences are nondiagnostic. Electronically Signed   By: Lucienne Capers M.D.   On: 09/28/2017 23:32   Dg Chest Port 1 View  Result Date: 10/01/2017 CLINICAL DATA:  Fever. EXAM: PORTABLE CHEST 1 VIEW COMPARISON:  09/28/2017 FINDINGS: New opacity is seen both lung bases which may be due to atelectasis or infiltrate. No evidence of pleural effusion. Heart size is normal. Aortic atherosclerosis. IMPRESSION: New mild bibasilar atelectasis versus infiltrates. Electronically Signed   By: Earle Gell M.D.   On: 10/01/2017 07:41   Dg Chest Portable 1 View  Result Date: 09/28/2017 CLINICAL DATA:  Chest pain, shortness of breath and chest tightness. EXAM: PORTABLE CHEST 1 VIEW COMPARISON:  03/12/2017 FINDINGS: Cardiomediastinal silhouette is normal. Mediastinal contours appear intact. Tortuosity of the aorta. There is no evidence of focal airspace consolidation, pleural effusion or pneumothorax. Osseous structures are without acute abnormality. Soft tissues are grossly normal. IMPRESSION: No active disease. Electronically Signed   By:  Fidela Salisbury M.D.   On: 09/28/2017 15:39   Dg Abd 2 Views  Result Date: 10/02/2017 CLINICAL DATA:  Constipation.  Abdominal distention. EXAM: ABDOMEN - 2 VIEW COMPARISON:  None. FINDINGS: There is fecal loading in the cecum and proximal ascending colon. No bowel obstruction. Air-filled mildly prominent loops of colon are seen in the left side of the abdomen. No free air, portal venous gas, or pneumatosis. IMPRESSION: 1. Fecal loading in the cecum and ascending colon. Mildly prominent air-filled loops of colon involving the transverse and proximal descending colon. No evidence of obstruction. Electronically Signed   By: Dorise Bullion III M.D   On: 10/02/2017 20:42   Mr Abdomen Mrcp W Wo Contast  Result Date: 09/28/2017 CLINICAL DATA:  Abdominal pain. Abnormal liver function studies. Epigastric pain and vomiting. EXAM: MRI ABDOMEN WITHOUT AND WITH CONTRAST (INCLUDING MRCP) TECHNIQUE: Multiplanar multisequence MR imaging of the abdomen was performed both before and after the administration of intravenous contrast. Heavily T2-weighted images of the biliary and pancreatic ducts were obtained, and three-dimensional MRCP images were rendered by post processing. CONTRAST:  23mL MULTIHANCE GADOBENATE DIMEGLUMINE 529 MG/ML IV SOLN COMPARISON:  CT abdomen and pelvis 09/28/2017 FINDINGS: Examination is technically limited due to motion artifact. Many sequences are nondiagnostic due to motion artifact. Lower chest: No specific acute abnormality  identified. Hepatobiliary: Cholelithiasis with multiple large stones in the gallbladder. No inflammatory changes to suggest cholecystitis. Mild bile duct dilatation is demonstrated. No obstructing mass lesion or stone is identified. No focal liver lesions. Pancreas: No mass, inflammatory changes, or other parenchymal abnormality identified. No pancreatic ductal dilatation. Spleen:  Within normal limits in size and appearance. Adrenals/Urinary Tract: No adrenal gland  nodules. Multiple bilateral renal parenchymal cysts. No hydronephrosis or solid mass identified. Stomach/Bowel: Visualized stomach, small bowel, and colon are not abnormally distended and no wall thickening is appreciated. Vascular/Lymphatic: Normal caliber abdominal aorta. No significant lymphadenopathy. Other:  None. Musculoskeletal: No suspicious bone lesions identified. IMPRESSION: 1. Cholelithiasis with large stones in the gallbladder. No evidence of cholecystitis. 2. Mild bile duct dilatation.  No cause identified. 3. Bilateral renal cysts.  No hydronephrosis. 4. Examination is technically limited due to motion artifact. Some sequences are nondiagnostic. Electronically Signed   By: Lucienne Capers M.D.   On: 09/28/2017 23:32   US Abdomen Limited Ruq  Result Date: 09/28/2017 CLINICAL DATA:  Right upper quadrant abdominal pain beginning this morning. EXAM: ULTRASOUND ABDOMEN LIMITED RIGHT UPPER QUADRANT COMPARISON:  Current abdomen pelvis CT FINDINGS: Gallbladder: Gallstones, largest measuring just under 3 cm. Stones cause posterior acoustic shadowing somewhat limiting evaluation of the gallbladder. No gallbladder wall thickening or pericholecystic fluid. There is some sludge. Common bile duct: Diameter: 10 mm.  No sonographic evidence of a duct stone. Liver: Normal size and parenchymal echogenicity. No mass or focal lesion. Mild central intrahepatic bile duct dilation. Portal vein is patent on color Doppler imaging with normal direction of blood flow towards the liver. IMPRESSION: 1. Gallstones without evidence of acute cholecystitis. 2. Mild intra and extrahepatic bile duct dilation as noted on the current CT. No sonographic evidence of a duct stone. Electronically Signed   By: Lajean Manes M.D.   On: 09/28/2017 19:04    Microbiology: Recent Results (from the past 240 hour(s))  MRSA PCR Screening     Status: None   Collection Time: 09/29/17  1:32 AM  Result Value Ref Range Status   MRSA by PCR  NEGATIVE NEGATIVE Final    Comment:        The GeneXpert MRSA Assay (FDA approved for NASAL specimens only), is one component of a comprehensive MRSA colonization surveillance program. It is not intended to diagnose MRSA infection nor to guide or monitor treatment for MRSA infections. Performed at [facility], [place] [address] [place], [place] [address]   Surgical PCR screen     Status: None   Collection Time: 09/29/17 10:21 PM  Result Value Ref Range Status   MRSA, PCR NEGATIVE NEGATIVE Final   Staphylococcus aureus NEGATIVE NEGATIVE Final    Comment: (NOTE) The Xpert SA Assay (FDA approved for NASAL specimens in patients 37 years of age and older), is one component of a comprehensive surveillance program. It is not intended to diagnose infection nor to guide or monitor treatment. Performed at [facility], [place] [address] [place], [place] [address]   Culture, blood (routine x 2)     Status: None (Preliminary result)   Collection Time: 10/01/17  7:09 AM  Result Value Ref Range Status   Specimen Description   Final    BLOOD RIGHT HAND Performed at [place] [address] [place], [place] [address]    Special Requests   Final    BOTTLES DRAWN AEROBIC AND ANAEROBIC Blood Culture adequate volume Performed at [facility], [place] [address] [place], [place] [address]    Culture   Final    NO GROWTH 3 DAYS Performed at [facility], [place] [address], [place], [place] [address]    Report Status PENDING  Incomplete  Culture, blood (routine x 2)     Status: None (Preliminary result)   Collection Time: 10/01/17  7:11 AM  Result Value Ref Range Status   Specimen Description   Final    BLOOD LEFT FOREARM Performed at [place] [address] [place], [place] [address]    Special Requests   Final    BOTTLES DRAWN AEROBIC AND ANAEROBIC Blood Culture adequate  volume Performed at [place] [address], [place], [place] [address]    Culture   Final    NO GROWTH 3 DAYS Performed at [facility], [place] [address], [place], [place] [address]    Report Status PENDING  Incomplete  Urine Culture     Status: None   Collection Time: 10/02/17  8:51 PM  Result Value Ref Range Status   Specimen Description   Final    URINE, CLEAN CATCH Performed at [facility], [place] [address] [place], [place] [address]    Special Requests   Final    NONE Performed at [facility], [place] [address], [place], [place] [address]    Culture   Final    NO GROWTH Performed at [facility], [place] [address] [place], [place] [address]    Report Status 10/04/2017 FINAL  Final     Labs: Basic Metabolic Panel: Recent Labs  Lab 10/01/17 0524 10/02/17 0539 10/03/17 0532 10/04/17 0548 10/05/17 0530  NA 138 134* 132* 136 134*  K 3.8 3.6 3.3* 4.3 3.6  CL 104 97* 97* 99* 103  CO2 22 24 22 26 22   GLUCOSE 118* 145* 114* 94 117*  BUN 15 15 17  28* 30*  CREATININE 1.16 1.17 1.06 1.38* 1.20  CALCIUM 9.0 9.2 8.9 9.0 8.7*   Liver Function Tests: Recent Labs  Lab 10/01/17 0524 10/02/17 0539 10/03/17 0532 10/04/17 0548 10/05/17 0530  AST 83* 39 19 18 20   ALT 275* 186* 107* 76* 56  ALKPHOS 218* 196* 150* 138* 122  BILITOT 1.8* 1.7* 1.5* 1.6* 1.2  PROT 7.2 7.7 7.0 7.2 7.1  ALBUMIN 3.7 3.8 3.2* 3.2* 3.0*   Recent Labs  Lab 09/28/17 1654  LIPASE 40   No results for input(s): AMMONIA in the last 168 hours. CBC: Recent Labs  Lab 09/29/17 0530 10/02/17 0539 10/03/17 0532 10/04/17 0548 10/05/17 0530  WBC 9.4 22.6* 19.7* 15.1* 9.9  NEUTROABS  --  18.7* 15.8* 10.8* 6.5  HGB 13.0 13.3 11.6* 12.0* 10.6*  HCT 37.8* 38.3* 34.7* 34.8* 30.9*  MCV 90.9 92.1 90.6 91.6 92.0  PLT 319 282 310 371 345   Cardiac Enzymes: No results for input(s): CKTOTAL, CKMB, CKMBINDEX, TROPONINI in the last 168  hours. BNP: BNP (last 3 results) No results for input(s): BNP in the last 8760 hours.  ProBNP (last 3 results) No results for input(s): PROBNP in the last 8760 hours.  CBG: No results for input(s): GLUCAP in the last 168 hours.     Signed:  Alma Friendly, MD [facility] 10/05/2017, 12:08 PM

## 2017-10-06 LAB — CULTURE, BLOOD (ROUTINE X 2)
Culture: NO GROWTH
Culture: NO GROWTH
Special Requests: ADEQUATE
Special Requests: ADEQUATE

## 2017-10-07 ENCOUNTER — Telehealth: Payer: Self-pay | Admitting: *Deleted

## 2017-10-07 ENCOUNTER — Other Ambulatory Visit: Payer: Self-pay | Admitting: Internal Medicine

## 2017-10-07 DIAGNOSIS — M159 Polyosteoarthritis, unspecified: Secondary | ICD-10-CM

## 2017-10-07 DIAGNOSIS — M5416 Radiculopathy, lumbar region: Secondary | ICD-10-CM

## 2017-10-07 NOTE — Telephone Encounter (Signed)
Oxycodone refill Last Refill:08/28/17 #90 Last OV: 08/09/17 PCP: Dr. Ronnald Ramp [facility]acy:[facility]  [place]

## 2017-10-07 NOTE — Telephone Encounter (Signed)
Copied from [place] [phone]. Topic: Quick Communication - See Telephone Encounter >> Oct 07, 2017  9:00 AM Ahmed Prima L wrote: CRM for notification. See Telephone encounter for: 10/07/17.  oxyCODONE-acetaminophen (PERCOCET) 10-325 MG tablet  [id number] [place] [place] - [place]

## 2017-10-07 NOTE — Telephone Encounter (Signed)
Transition Care Management Follow-up Telephone Call   Date discharged? 10/05/17   How have you been since you were released from the hospital? Pt states he is doing alright   Do you understand why you were in the hospital? YES   Do you understand the discharge instructions? YES   Where were you discharged to? Home   Items Reviewed:  Medications reviewed: YES  Allergies reviewed: YES  Dietary changes reviewed: YES  Referrals reviewed: YES, pt confirm appt w/surgeon for the 11th   Functional Questionnaire:   Activities of Daily Living (ADLs):   He states he are independent in the following: ambulation, bathing and hygiene, feeding, continence, grooming, toileting and dressing States he doesn't require assistance    Any transportation issues/concerns?: NO   Any patient concerns? NO   Confirmed importance and date/time of follow-up visits scheduled YES, appt 10/17/17  Provider Appointment booked with Dr. Ronnald Ramp  Confirmed with patient if condition begins to worsen call PCP or go to the ER.  Patient was given the office number and encouraged to call back with question or concerns.  : YES

## 2017-10-08 MED ORDER — OXYCODONE-ACETAMINOPHEN 10-325 MG PO TABS: 1.0000 | ORAL_TABLET | Freq: Three times a day (TID) | ORAL | 0 refills | Status: DC | PRN

## 2017-10-09 ENCOUNTER — Telehealth: Payer: Self-pay | Admitting: Internal Medicine

## 2017-10-09 DIAGNOSIS — K279 Peptic ulcer, site unspecified, unspecified as acute or chronic, without hemorrhage or perforation: Secondary | ICD-10-CM | POA: Diagnosis not present

## 2017-10-09 DIAGNOSIS — E785 Hyperlipidemia, unspecified: Secondary | ICD-10-CM | POA: Diagnosis not present

## 2017-10-09 DIAGNOSIS — K812 Acute cholecystitis with chronic cholecystitis: Secondary | ICD-10-CM | POA: Diagnosis not present

## 2017-10-09 DIAGNOSIS — G894 Chronic pain syndrome: Secondary | ICD-10-CM | POA: Diagnosis not present

## 2017-10-09 DIAGNOSIS — Z48815 Encounter for surgical aftercare following surgery on the digestive system: Secondary | ICD-10-CM | POA: Diagnosis not present

## 2017-10-09 DIAGNOSIS — Z79891 Long term (current) use of opiate analgesic: Secondary | ICD-10-CM | POA: Diagnosis not present

## 2017-10-09 DIAGNOSIS — K210 Gastro-esophageal reflux disease with esophagitis: Secondary | ICD-10-CM | POA: Diagnosis not present

## 2017-10-09 DIAGNOSIS — F17210 Nicotine dependence, cigarettes, uncomplicated: Secondary | ICD-10-CM | POA: Diagnosis not present

## 2017-10-09 DIAGNOSIS — N400 Enlarged prostate without lower urinary tract symptoms: Secondary | ICD-10-CM | POA: Diagnosis not present

## 2017-10-09 DIAGNOSIS — I5030 Unspecified diastolic (congestive) heart failure: Secondary | ICD-10-CM | POA: Diagnosis not present

## 2017-10-09 DIAGNOSIS — I110 Hypertensive heart disease with heart failure: Secondary | ICD-10-CM | POA: Diagnosis not present

## 2017-10-09 NOTE — Telephone Encounter (Signed)
Copied from [place] [phone]. Topic: Quick Communication - See Telephone Encounter >> Oct 09, 2017  2:55 PM Cleaster Corin, NT wrote: CRM for notification. See Telephone encounter for: 10/09/17.  Kelly calling from [facility] to let Dr. Ronnald Ramp know that  Pt. Is only taking irbesartan (AVAPRO) 300 MG tablet [[id number]] oxyCODONE-acetaminophen (PERCOCET) 10-325 MG tablet [[id number]] pt. Stated that the pharmacy only wants his money and he isn't taking any other medication. Claiborne Billings can be reached at [phone]

## 2017-10-17 ENCOUNTER — Ambulatory Visit: Payer: Medicare Other | Admitting: Internal Medicine

## 2017-10-17 ENCOUNTER — Encounter: Payer: Self-pay | Admitting: Internal Medicine

## 2017-10-17 ENCOUNTER — Other Ambulatory Visit (INDEPENDENT_AMBULATORY_CARE_PROVIDER_SITE_OTHER): Payer: Medicare Other

## 2017-10-17 VITALS — BP 120/60 | HR 70 | Temp 97.7°F | Resp 16 | Ht 69.0 in | Wt 191.8 lb

## 2017-10-17 DIAGNOSIS — K264 Chronic or unspecified duodenal ulcer with hemorrhage: Secondary | ICD-10-CM | POA: Diagnosis not present

## 2017-10-17 DIAGNOSIS — Z23 Encounter for immunization: Secondary | ICD-10-CM | POA: Diagnosis not present

## 2017-10-17 DIAGNOSIS — K2100 Gastro-esophageal reflux disease with esophagitis, without bleeding: Secondary | ICD-10-CM

## 2017-10-17 DIAGNOSIS — N289 Disorder of kidney and ureter, unspecified: Secondary | ICD-10-CM

## 2017-10-17 DIAGNOSIS — D473 Essential (hemorrhagic) thrombocythemia: Secondary | ICD-10-CM

## 2017-10-17 DIAGNOSIS — M75101 Unspecified rotator cuff tear or rupture of right shoulder, not specified as traumatic: Secondary | ICD-10-CM

## 2017-10-17 DIAGNOSIS — E785 Hyperlipidemia, unspecified: Secondary | ICD-10-CM | POA: Diagnosis not present

## 2017-10-17 DIAGNOSIS — D508 Other iron deficiency anemias: Secondary | ICD-10-CM

## 2017-10-17 DIAGNOSIS — D75839 Thrombocytosis, unspecified: Secondary | ICD-10-CM | POA: Insufficient documentation

## 2017-10-17 DIAGNOSIS — K222 Esophageal obstruction: Secondary | ICD-10-CM

## 2017-10-17 DIAGNOSIS — Z79891 Long term (current) use of opiate analgesic: Secondary | ICD-10-CM

## 2017-10-17 DIAGNOSIS — M12811 Other specific arthropathies, not elsewhere classified, right shoulder: Secondary | ICD-10-CM

## 2017-10-17 DIAGNOSIS — I10 Essential (primary) hypertension: Secondary | ICD-10-CM | POA: Diagnosis not present

## 2017-10-17 LAB — LIPID PANEL
Cholesterol: 147 mg/dL (ref 0–200)
HDL: 30.5 mg/dL — ABNORMAL LOW (ref >39.00)
NonHDL: 116.3
Total CHOL/HDL Ratio: 5
Triglycerides: 243 mg/dL — ABNORMAL HIGH (ref 0.0–149.0)
VLDL: 48.6 mg/dL — ABNORMAL HIGH (ref 0.0–40.0)

## 2017-10-17 LAB — FERRITIN: Ferritin: 209.2 ng/mL (ref 22.0–322.0)

## 2017-10-17 LAB — BASIC METABOLIC PANEL
BUN: 17 mg/dL (ref 6–23)
CO2: 28 mEq/L (ref 19–32)
Calcium: 9.9 mg/dL (ref 8.4–10.5)
Chloride: 101 mEq/L (ref 96–112)
Creatinine, Ser: 1.04 mg/dL (ref 0.40–1.50)
GFR: 89.19 mL/min (ref >60.00)
Glucose, Bld: 100 mg/dL — ABNORMAL HIGH (ref 70–99)
Potassium: 3.9 mEq/L (ref 3.5–5.1)
Sodium: 138 mEq/L (ref 135–145)

## 2017-10-17 LAB — IBC PANEL
Iron: 43 ug/dL (ref 42–165)
Saturation Ratios: 13.8 % — ABNORMAL LOW (ref 20.0–50.0)
Transferrin: 223 mg/dL (ref 212.0–360.0)

## 2017-10-17 LAB — CBC WITH DIFFERENTIAL/PLATELET
Basophils Absolute: 0.1 10*3/uL (ref 0.0–0.1)
Basophils Relative: 0.8 % (ref 0.0–3.0)
Eosinophils Absolute: 0.2 10*3/uL (ref 0.0–0.7)
Eosinophils Relative: 1.3 % (ref 0.0–5.0)
HCT: 37.8 % — ABNORMAL LOW (ref 39.0–52.0)
Hemoglobin: 12.9 g/dL — ABNORMAL LOW (ref 13.0–17.0)
Lymphocytes Relative: 24.9 % (ref 12.0–46.0)
Lymphs Abs: 3 10*3/uL (ref 0.7–4.0)
MCHC: 34 g/dL (ref 30.0–36.0)
MCV: 92.4 fl (ref 78.0–100.0)
Monocytes Absolute: 0.9 10*3/uL (ref 0.1–1.0)
Monocytes Relative: 7.4 % (ref 3.0–12.0)
Neutro Abs: 8 10*3/uL — ABNORMAL HIGH (ref 1.4–7.7)
Neutrophils Relative %: 65.6 % (ref 43.0–77.0)
Platelets: 677 10*3/uL — ABNORMAL HIGH (ref 150.0–400.0)
RBC: 4.09 Mil/uL — ABNORMAL LOW (ref 4.22–5.81)
RDW: 14 % (ref 11.5–15.5)
WBC: 12.2 10*3/uL — ABNORMAL HIGH (ref 4.0–10.5)

## 2017-10-17 LAB — LDL CHOLESTEROL, DIRECT: Direct LDL: 40 mg/dL

## 2017-10-17 MED ORDER — METHYLPREDNISOLONE ACETATE 40 MG/ML IJ SUSP
40.0000 mg | Freq: Once | INTRAMUSCULAR | Status: AC
  Administered 2017-10-17: 40 mg via INTRAMUSCULAR

## 2017-10-17 MED ORDER — NALOXONE HCL 4 MG/0.1ML NA LIQD: 1.0000 | Freq: Once | NASAL | 2 refills | Status: AC

## 2017-10-17 MED ORDER — PANTOPRAZOLE SODIUM 40 MG PO TBEC: 40.0000 mg | DELAYED_RELEASE_TABLET | Freq: Every day | ORAL | 1 refills | Status: DC

## 2017-10-17 MED ORDER — ATORVASTATIN CALCIUM 20 MG PO TABS: 20.0000 mg | ORAL_TABLET | Freq: Every day | ORAL | 1 refills | Status: DC

## 2017-10-17 MED ORDER — CHLORTHALIDONE 25 MG PO TABS: 25.0000 mg | ORAL_TABLET | Freq: Every day | ORAL | 0 refills | Status: DC

## 2017-10-17 NOTE — Patient Instructions (Signed)
Iron Deficiency Anemia, Adult Iron deficiency anemia is a condition in which the concentration of red blood cells or hemoglobin in the blood is below normal because of too little iron. Hemoglobin is a substance in red blood cells that carries oxygen to the body's tissues. When the concentration of red blood cells or hemoglobin is too low, not enough oxygen reaches these tissues. Iron deficiency anemia is usually long-lasting (chronic) and it develops over time. It may or may not cause symptoms. It is a common type of anemia. What are the causes? This condition may be caused by:  Not enough iron in the diet.  Blood loss caused by bleeding in the intestine.  Blood loss from a gastrointestinal condition like Crohn disease.  Frequent blood draws, such as from blood donation.  Abnormal absorption in the gut.  Heavy menstrual periods in women.  Cancers of the gastrointestinal system, such as colon cancer.  What are the signs or symptoms? Symptoms of this condition may include:  Fatigue.  Headache.  Pale skin, lips, and nail beds.  Poor appetite.  Weakness.  Shortness of breath.  Dizziness.  Cold hands and feet.  Fast or irregular heartbeat.  Irritability. This is more common in severe anemia.  Rapid breathing. This is more common in severe anemia.  Mild anemia may not cause any symptoms. How is this diagnosed? This condition is diagnosed based on:  Your medical history.  A physical exam.  Blood tests.  You may have additional tests to find the underlying cause of your anemia, such as:  Testing for blood in the stool (fecal occult blood test).  A procedure to see inside your colon and rectum (colonoscopy).  A procedure to see inside your esophagus and stomach (endoscopy).  A test in which cells are removed from bone marrow (bone marrow aspiration) or fluid is removed from the bone marrow to be examined (biopsy). This is rarely needed.  How is this  treated? This condition is treated by correcting the cause of your iron deficiency. Treatment may involve:  Adding iron-rich foods to your diet.  Taking iron supplements. If you are pregnant or breastfeeding, you may need to take extra iron because your normal diet usually does not provide the amount of iron that you need.  Increasing vitamin C intake. Vitamin C helps your body absorb iron. Your health care provider may recommend that you take iron supplements along with a glass of orange juice or a vitamin C supplement.  Medicines to make heavy menstrual flow lighter.  Surgery.  You may need repeat blood tests to determine whether treatment is working. Depending on the underlying cause, the anemia should be corrected within 2 months of starting treatment. If the treatment does not seem to be working, you may need more testing. Follow these instructions at home: Medicines  Take over-the-counter and prescription medicines only as told by your health care provider. This includes iron supplements and vitamins.  If you cannot tolerate taking iron supplements by mouth, talk with your health care provider about taking them through a vein (intravenously) or an injection into a muscle.  For the best iron absorption, you should take iron supplements when your stomach is empty. If you cannot tolerate them on an empty stomach, you may need to take them with food.  Do not drink milk or take antacids at the same time as your iron supplements. Milk and antacids may interfere with iron absorption.  Iron supplements can cause constipation. To prevent constipation, include fiber   in your diet as told by your health care provider. A stool softener may also be recommended. Eating and drinking  Talk with your health care provider before changing your diet. He or she may recommend that you eat foods that contain a lot of iron, such as: ? Liver. ? Low-fat (lean) beef. ? Breads and cereals that have iron  added to them (are fortified). ? Eggs. ? Dried fruit. ? Dark green, leafy vegetables.  To help your body use the iron from iron-rich foods, eat those foods at the same time as fresh fruits and vegetables that are high in vitamin C. Foods that are high in vitamin C include: ? Oranges. ? Peppers. ? Tomatoes. ? Mangoes.  Drinkenoughfluid to keep your urine clear or pale yellow. General instructions  Return to your normal activities as told by your health care provider. Ask your health care provider what activities are safe for you.  Practice good hygiene. Anemia can make you more prone to illness and infection.  Keep all follow-up visits as told by your health care provider. This is important. Contact a health care provider if:  You feel nauseous or you vomit.  You feel weak.  You have unexplained sweating.  You develop symptoms of constipation, such as: ? Having fewer than three bowel movements a week. ? Straining to have a bowel movement. ? Having stools that are hard, dry, or larger than normal. ? Feeling full or bloated. ? Pain in the lower abdomen. ? Not feeling relief after having a bowel movement. Get help right away if:  You faint. If this happens, do not drive yourself to the hospital. Call your local emergency services (911 in the [place]  You have chest pain.  You have shortness of breath that: ? Is severe. ? Gets worse with physical activity.  You have a rapid heartbeat.  You become light-headed when getting up from a sitting or lying down position. This information is not intended to replace advice given to you by your health care provider. Make sure you discuss any questions you have with your health care provider. Document Released: 04/20/2000 Document Revised: 01/11/2016 Document Reviewed: 01/11/2016 Elsevier Interactive Patient Education  2018 [employer] Inc[employer]  

## 2017-10-17 NOTE — Progress Notes (Signed)
Subjective:  Patient ID: Adrian Miller, male    DOB: 04-10-1941  Age: 77 y.o. MRN: [id number]  CC: Anemia; Hypertension; Hyperlipidemia; and Gastroesophageal Reflux   HPI Adrian Miller presents for f/up - He was recently admitted for abdominal pain and was found to have cholelithiasis and underwent successful cholecystectomy.  In the interim he has lost about 10 pounds.  He denies any recent episodes of abdominal pain, nausea, vomiting, diarrhea, or constipation.  He continues to complain of joint pain.  He wants a refill of Percocet.  He also tells me he has pain in his right shoulder and wants to get a steroid injection.  He has a history of peptic ulcer disease but is not currently taking a PPI.  His last upper endoscopy was about 2 years ago.  Outpatient Medications Prior to Visit  Medication Sig Dispense Refill  . irbesartan (AVAPRO) 300 MG tablet Take 1 tablet (300 mg total) by mouth daily. 90 tablet 1  . oxyCODONE-acetaminophen (PERCOCET) 10-325 MG tablet Take 1 tablet by mouth every 8 (eight) hours as needed for pain. 90 tablet 0  . chlorthalidone (HYGROTON) 25 MG tablet TAKE 1 TABLET BY MOUTH EVERY DAY 90 tablet 0  . ferrous sulfate 325 (65 FE) MG tablet Take 1 tablet (325 mg total) by mouth 3 (three) times daily with meals. (Patient not taking: Reported on 09/28/2017) 90 tablet 5  . atorvastatin (LIPITOR) 20 MG tablet Take 1 tablet (20 mg total) by mouth daily. (Patient not taking: Reported on 09/28/2017) 90 tablet 3  . furosemide (LASIX) 20 MG tablet Take 1 tablet (20 mg total) by mouth daily as needed for edema. (Patient not taking: Reported on 09/28/2017) 30 tablet 3  . gabapentin (NEURONTIN) 100 MG capsule Take 2 capsules (200 mg total) by mouth at bedtime. (Patient not taking: Reported on 09/28/2017) 60 capsule 3  . hydrocortisone (ANUSOL-HC) 2.5 % rectal cream Apply 1 application topically 4 (four) times daily as needed for hemorrhoids. (Patient not taking: Reported on 10/17/2017) 30 g  0  . nicotine (NICODERM CQ - DOSED IN MG/24 HOURS) 21 mg/24hr patch Place 1 patch (21 mg total) onto the skin daily. (Patient not taking: Reported on 10/17/2017) 28 patch 0  . pantoprazole (PROTONIX) 40 MG tablet Take 1 tablet (40 mg total) by mouth daily. (Patient not taking: Reported on 09/28/2017) 30 tablet 0  . polyethylene glycol (MIRALAX / GLYCOLAX) packet Take 17 g by mouth daily. (Patient not taking: Reported on 10/17/2017) 14 each 0  . senna-docusate (SENOKOT-S) 8.6-50 MG tablet Take 1 tablet by mouth at bedtime as needed for mild constipation. (Patient not taking: Reported on 10/17/2017) 30 tablet 0   No facility-administered medications prior to visit.     ROS Review of Systems  Constitutional: Negative.  Negative for appetite change, diaphoresis, fatigue and unexpected weight change.  HENT: Negative.  Negative for trouble swallowing.   Eyes: Negative for visual disturbance.  Respiratory: Negative for cough, chest tightness, shortness of breath and wheezing.   Cardiovascular: Negative for chest pain, palpitations and leg swelling.  Gastrointestinal: Negative.  Negative for abdominal pain, constipation, diarrhea, nausea and vomiting.  Endocrine: Negative.   Genitourinary: Negative.  Negative for difficulty urinating, dysuria and hematuria.  Musculoskeletal: Positive for arthralgias. Negative for back pain, myalgias and neck pain.  Skin: Negative.   Neurological: Negative.  Negative for dizziness, weakness and light-headedness.  Hematological: Negative for adenopathy. Does not bruise/bleed easily.  Psychiatric/Behavioral: Negative.     Objective:  BP 120/60 (BP Location: Left Arm, Patient Position: Sitting, Cuff Size: Normal)   Pulse 70   Temp 97.7 F (36.5 C) (Oral)   Resp 16   Ht _0  (1.753 m)   Wt 191 lb 12 oz (87 kg)   SpO2 94%   BMI 28.32 kg/m   BP Readings from Last 3 Encounters:  10/17/17 120/60  10/05/17 120/75  08/09/17 138/70    Wt Readings from Last 3  Encounters:  10/17/17 191 lb 12 oz (87 kg)  10/04/17 191 lb 2.2 oz (86.7 kg)  08/09/17 201 lb (91.2 kg)    Physical Exam  Constitutional: He is oriented to person, place, and time. No distress.  HENT:  Mouth/Throat: Oropharynx is clear and moist. No oropharyngeal exudate.  Eyes: Conjunctivae are normal. No scleral icterus.  Neck: Normal range of motion. Neck supple. No JVD present. No thyromegaly present.  Cardiovascular: Normal rate, regular rhythm and normal heart sounds. Exam reveals no gallop.  No murmur heard. Pulmonary/Chest: Effort normal and breath sounds normal. No respiratory distress. He has no wheezes. He has no rales.  Abdominal: Soft. Bowel sounds are normal. He exhibits no mass. There is no tenderness. There is no rebound.  Musculoskeletal: Normal range of motion. He exhibits no edema, tenderness or deformity.  Lymphadenopathy:    He has no cervical adenopathy.  Neurological: He is alert and oriented to person, place, and time.  Skin: Skin is warm and dry. No rash noted. He is not diaphoretic.  Vitals reviewed.   Lab Results  Component Value Date   WBC 12.2 Repeated and verified X2. (H) 10/17/2017   HGB 12.9 (L) 10/17/2017   HCT 37.8 (L) 10/17/2017   PLT 677.0 Repeated and verified X2. (H) 10/17/2017   GLUCOSE 100 (H) 10/17/2017   CHOL 147 10/17/2017   TRIG 243.0 (H) 10/17/2017   HDL 30.50 (L) 10/17/2017   LDLDIRECT 40.0 10/17/2017   LDLCALC 67 11/26/2016   ALT 56 10/05/2017   AST 20 10/05/2017   NA 138 10/17/2017   K 3.9 10/17/2017   CL 101 10/17/2017   CREATININE 1.04 10/17/2017   BUN 17 10/17/2017   CO2 28 10/17/2017   TSH 1.42 11/26/2016   PSA 0.87 03/18/2014   INR 1.08 02/11/2015   HGBA1C 6.2 08/09/2017    Ct Abdomen Pelvis W Contrast  Result Date: 09/28/2017 CLINICAL DATA:  Patient does have history of ulcer disease and acute esophagitis. He says this feels different than that. He reports about 4 AM he woke up and that pain is been continued  since then. Patient took his chronic pain medication at home and did not get better. On arrival patient had a 10 out of 10 pain, however it is since feels better after lying still EXAM: CT ABDOMEN AND PELVIS WITH CONTRAST TECHNIQUE: Multidetector CT imaging of the abdomen and pelvis was performed using the standard protocol following bolus administration of intravenous contrast. CONTRAST:  <See Chart> ISOVUE-300 IOPAMIDOL (ISOVUE-300) INJECTION 61% COMPARISON:  None. FINDINGS: Lower chest: No acute abnormality. Hepatobiliary: Liver normal in size and attenuation. No mass or focal lesions. There is mild intra and extrahepatic bile duct dilation. Common bile duct measures 1 cm proximally, but shows normal distal tapering. No evidence of a duct stone. Gallbladder is moderately distended. There are gallstones. No wall thickening. No adjacent inflammation. Pancreas: Unremarkable. No pancreatic ductal dilatation or surrounding inflammatory changes. Spleen: Normal in size without focal abnormality. Adrenals/Urinary Tract: No adrenal masses. Multiple bilateral low-density nonenhancing renal masses,  largest arising from the anteromedial mid to lower pole of the left kidney measuring 5.1 cm. These are all consistent with cysts. No renal stones. No hydronephrosis. Ureters are normal course and in caliber. Bladder is unremarkable. Stomach/Bowel: Stomach is unremarkable. Small bowel is normal caliber with no wall thickening or inflammation. There are numerous colonic diverticula. No evidence of diverticulitis or other colon inflammatory process. Appendix is normal. Vascular/Lymphatic: There are several prominent to mildly enlarged gastrohepatic ligament lymph nodes, largest measuring 11 mm in short axis. No other adenopathy. Atherosclerotic disease noted along the abdominal aorta and iliac vessels. No aneurysm. Reproductive: Prostate is mildly enlarged measuring 4.8 cm maximum transverse dimension. Other: No abdominal wall  hernia or abnormality. No abdominopelvic ascites. Musculoskeletal: No fracture or acute finding. No osteoblastic or osteolytic lesions. There are significant degenerative changes of the mid to lower lumbar spine. IMPRESSION: 1. No acute findings within the abdomen or pelvis. 2. Extensive colonic diverticulosis with no evidence of diverticulitis. 3. Mild intra and extrahepatic bile duct dilation. No evidence bile duct obstruction. This is most likely chronic. 4. Gallstones.  No acute cholecystitis. 5. Multiple renal cysts. 6. Aortic atherosclerosis. Electronically Signed   By: Lajean Manes M.D.   On: 09/28/2017 17:29   Mr 3d Recon At Scanner  Result Date: 09/28/2017 CLINICAL DATA:  Abdominal pain. Abnormal liver function studies. Epigastric pain and vomiting. EXAM: MRI ABDOMEN WITHOUT AND WITH CONTRAST (INCLUDING MRCP) TECHNIQUE: Multiplanar multisequence MR imaging of the abdomen was performed both before and after the administration of intravenous contrast. Heavily T2-weighted images of the biliary and pancreatic ducts were obtained, and three-dimensional MRCP images were rendered by post processing. CONTRAST:  67m MULTIHANCE GADOBENATE DIMEGLUMINE 529 MG/ML IV SOLN COMPARISON:  CT abdomen and pelvis 09/28/2017 FINDINGS: Examination is technically limited due to motion artifact. Many sequences are nondiagnostic due to motion artifact. Lower chest: No specific acute abnormality identified. Hepatobiliary: Cholelithiasis with multiple large stones in the gallbladder. No inflammatory changes to suggest cholecystitis. Mild bile duct dilatation is demonstrated. No obstructing mass lesion or stone is identified. No focal liver lesions. Pancreas: No mass, inflammatory changes, or other parenchymal abnormality identified. No pancreatic ductal dilatation. Spleen:  Within normal limits in size and appearance. Adrenals/Urinary Tract: No adrenal gland nodules. Multiple bilateral renal parenchymal cysts. No hydronephrosis  or solid mass identified. Stomach/Bowel: Visualized stomach, small bowel, and colon are not abnormally distended and no wall thickening is appreciated. Vascular/Lymphatic: Normal caliber abdominal aorta. No significant lymphadenopathy. Other:  None. Musculoskeletal: No suspicious bone lesions identified. IMPRESSION: 1. Cholelithiasis with large stones in the gallbladder. No evidence of cholecystitis. 2. Mild bile duct dilatation.  No cause identified. 3. Bilateral renal cysts.  No hydronephrosis. 4. Examination is technically limited due to motion artifact. Some sequences are nondiagnostic. Electronically Signed   By: WLucienne CapersM.D.   On: 09/28/2017 23:32   Dg Chest Portable 1 View  Result Date: 09/28/2017 CLINICAL DATA:  Chest pain, shortness of breath and chest tightness. EXAM: PORTABLE CHEST 1 VIEW COMPARISON:  03/12/2017 FINDINGS: Cardiomediastinal silhouette is normal. Mediastinal contours appear intact. Tortuosity of the aorta. There is no evidence of focal airspace consolidation, pleural effusion or pneumothorax. Osseous structures are without acute abnormality. Soft tissues are grossly normal. IMPRESSION: No active disease. Electronically Signed   By: DFidela SalisburyM.D.   On: 09/28/2017 15:39   Mr Abdomen Mrcp WMoise BoringContast  Result Date: 09/28/2017 CLINICAL DATA:  Abdominal pain. Abnormal liver function studies. Epigastric pain and vomiting. EXAM: MRI  ABDOMEN WITHOUT AND WITH CONTRAST (INCLUDING MRCP) TECHNIQUE: Multiplanar multisequence MR imaging of the abdomen was performed both before and after the administration of intravenous contrast. Heavily T2-weighted images of the biliary and pancreatic ducts were obtained, and three-dimensional MRCP images were rendered by post processing. CONTRAST:  75m MULTIHANCE GADOBENATE DIMEGLUMINE 529 MG/ML IV SOLN COMPARISON:  CT abdomen and pelvis 09/28/2017 FINDINGS: Examination is technically limited due to motion artifact. Many sequences are  nondiagnostic due to motion artifact. Lower chest: No specific acute abnormality identified. Hepatobiliary: Cholelithiasis with multiple large stones in the gallbladder. No inflammatory changes to suggest cholecystitis. Mild bile duct dilatation is demonstrated. No obstructing mass lesion or stone is identified. No focal liver lesions. Pancreas: No mass, inflammatory changes, or other parenchymal abnormality identified. No pancreatic ductal dilatation. Spleen:  Within normal limits in size and appearance. Adrenals/Urinary Tract: No adrenal gland nodules. Multiple bilateral renal parenchymal cysts. No hydronephrosis or solid mass identified. Stomach/Bowel: Visualized stomach, small bowel, and colon are not abnormally distended and no wall thickening is appreciated. Vascular/Lymphatic: Normal caliber abdominal aorta. No significant lymphadenopathy. Other:  None. Musculoskeletal: No suspicious bone lesions identified. IMPRESSION: 1. Cholelithiasis with large stones in the gallbladder. No evidence of cholecystitis. 2. Mild bile duct dilatation.  No cause identified. 3. Bilateral renal cysts.  No hydronephrosis. 4. Examination is technically limited due to motion artifact. Some sequences are nondiagnostic. Electronically Signed   By: WLucienne CapersM.D.   On: 09/28/2017 23:32   UKoreaAbdomen Limited Ruq  Result Date: 09/28/2017 CLINICAL DATA:  Right upper quadrant abdominal pain beginning this morning. EXAM: ULTRASOUND ABDOMEN LIMITED RIGHT UPPER QUADRANT COMPARISON:  Current abdomen pelvis CT FINDINGS: Gallbladder: Gallstones, largest measuring just under 3 cm. Stones cause posterior acoustic shadowing somewhat limiting evaluation of the gallbladder. No gallbladder wall thickening or pericholecystic fluid. There is some sludge. Common bile duct: Diameter: 10 mm.  No sonographic evidence of a duct stone. Liver: Normal size and parenchymal echogenicity. No mass or focal lesion. Mild central intrahepatic bile duct  dilation. Portal vein is patent on color Doppler imaging with normal direction of blood flow towards the liver. IMPRESSION: 1. Gallstones without evidence of acute cholecystitis. 2. Mild intra and extrahepatic bile duct dilation as noted on the current CT. No sonographic evidence of a duct stone. Electronically Signed   By: DLajean ManesM.D.   On: 09/28/2017 19:04    Assessment & Plan:   DAlexsiswas seen today for anemia, hypertension, hyperlipidemia and gastroesophageal reflux.  Diagnoses and all orders for this visit:  Other iron deficiency anemias- He has a persistent anemia.  His iron level is normal now.  I have asked to see him to see GI to see if he needs to undergo upper and lower endoscopy to screen for GI sources of blood loss. -     Cancel: Ambulatory referral to Gastroenterology -     CBC with Differential/Platelet; Future -     IBC panel; Future -     Ferritin; Future -     Ambulatory referral to Gastroenterology  Benign essential HTN-his blood pressure is well controlled.  Electrolytes are normal, renal function is stable.  Will continue the current combination of an ARB and thiazide diuretic for blood pressure control. -     chlorthalidone (HYGROTON) 25 MG tablet; Take 1 tablet (25 mg total) by mouth daily. -     Basic metabolic panel; Future  Need for pneumococcal vaccination -     Pneumococcal polysaccharide vaccine 23-valent greater  than or equal to 2yo subcutaneous/IM  Renal insufficiency -     Basic metabolic panel; Future  Hyperlipidemia with target LDL less than 130 -     atorvastatin (LIPITOR) 20 MG tablet; Take 1 tablet (20 mg total) by mouth daily. -     Lipid panel; Future  Duodenal ulcer hemorrhage -     pantoprazole (PROTONIX) 40 MG tablet; Take 1 tablet (40 mg total) by mouth daily. -     Cancel: Ambulatory referral to Gastroenterology -     Ambulatory referral to Gastroenterology  Gastroesophageal reflux disease with esophagitis- I have asked him to  start taking a PPI for this. -     pantoprazole (PROTONIX) 40 MG tablet; Take 1 tablet (40 mg total) by mouth daily. -     Cancel: Ambulatory referral to Gastroenterology -     Ambulatory referral to Gastroenterology  Esophageal stricture- I have asked him to start taking a PPI. -     pantoprazole (PROTONIX) 40 MG tablet; Take 1 tablet (40 mg total) by mouth daily. -     Cancel: Ambulatory referral to Gastroenterology -     Ambulatory referral to Gastroenterology  Long-term current use of opiate analgesic -     naloxone (NARCAN) nasal spray 4 mg/0.1 mL; Place 1 spray into the nose once for 1 dose.  Rotator cuff tear arthropathy of right shoulder- He received a Depo-Medrol shot injection in his right shoulder today. -     methylPREDNISolone acetate (DEPO-MEDROL) injection 40 mg  Thrombocytosis (HCC)-he has had an intermittent leukocytosis for the better part of a year now.  He is now developed a chronic anemia as well as significant thrombocytosis.  I have asked him to see hematology to be evaluated for lymphoproliferative disease. -     Ambulatory referral to Hematology   I have discontinued Shanon Brow L. Buehl's furosemide, gabapentin, nicotine, senna-docusate, polyethylene glycol, and hydrocortisone. I have also changed his chlorthalidone. Additionally, I am having him start on naloxone. Lastly, I am having him maintain his ferrous sulfate, irbesartan, oxyCODONE-acetaminophen, atorvastatin, and pantoprazole. We administered methylPREDNISolone acetate.  Meds ordered this encounter  Medications  . atorvastatin (LIPITOR) 20 MG tablet    Sig: Take 1 tablet (20 mg total) by mouth daily.    Dispense:  90 tablet    Refill:  1  . chlorthalidone (HYGROTON) 25 MG tablet    Sig: Take 1 tablet (25 mg total) by mouth daily.    Dispense:  90 tablet    Refill:  0  . pantoprazole (PROTONIX) 40 MG tablet    Sig: Take 1 tablet (40 mg total) by mouth daily.    Dispense:  90 tablet    Refill:  1    Take  1 tablet twice a day for two weeks then daily thereafter.  . naloxone (NARCAN) nasal spray 4 mg/0.1 mL    Sig: Place 1 spray into the nose once for 1 dose.    Dispense:  2 kit    Refill:  2  . methylPREDNISolone acetate (DEPO-MEDROL) injection 40 mg     Follow-up: Return in about 3 months (around 01/17/2018).  Scarlette Calico, MD

## 2017-10-18 ENCOUNTER — Telehealth: Payer: Self-pay | Admitting: Hematology

## 2017-10-18 ENCOUNTER — Telehealth: Payer: Self-pay | Admitting: *Deleted

## 2017-10-18 ENCOUNTER — Encounter: Payer: Self-pay | Admitting: Hematology

## 2017-10-18 NOTE — Telephone Encounter (Signed)
Irbesartan is not available. The pharmacy is requesting a substitute. Please advise.

## 2017-10-18 NOTE — Telephone Encounter (Signed)
New referral received from Dr. Scarlette Calico from [place] [facility] for the pt to see a hematologist. Dx; thrombocytosis. Pt has been scheduled to see Dr. Burr Medico on 7/2 at 10:15am. Pt aware to arrive 30 minutes early. Letter mailed.

## 2017-10-19 MED ORDER — OLMESARTAN MEDOXOMIL 40 MG PO TABS: 40.0000 mg | ORAL_TABLET | Freq: Every day | ORAL | 1 refills | Status: DC

## 2017-11-04 ENCOUNTER — Other Ambulatory Visit: Payer: Self-pay | Admitting: Internal Medicine

## 2017-11-04 NOTE — Telephone Encounter (Signed)
[name] registry last filled 10/08/2017.Marland KitchenJohny Miller

## 2017-11-04 NOTE — Telephone Encounter (Signed)
Copied from [place] [id number]. Topic: Quick Communication - Rx Refill/Question >> Nov 04, 2017 10:29 AM Synthia Innocent wrote: Medication: oxyCODONE-acetaminophen (PERCOCET) 10-325 MG tablet  Has the patient contacted their pharmacy? Yes.   (Agent: If no, request that the patient contact the pharmacy for the refill.) (Agent: If yes, when and what did the pharmacy advise?)  Preferred Pharmacy (with phone number or street name): [facility]  Agent: Please be advised that RX refills may take up to 3 business days. We ask that you follow-up with your pharmacy.

## 2017-11-05 ENCOUNTER — Other Ambulatory Visit: Payer: Self-pay | Admitting: Internal Medicine

## 2017-11-05 ENCOUNTER — Inpatient Hospital Stay: Payer: Medicare Other | Attending: Hematology | Admitting: Hematology

## 2017-11-05 DIAGNOSIS — M159 Polyosteoarthritis, unspecified: Secondary | ICD-10-CM

## 2017-11-05 DIAGNOSIS — Z79891 Long term (current) use of opiate analgesic: Secondary | ICD-10-CM

## 2017-11-05 DIAGNOSIS — M5416 Radiculopathy, lumbar region: Secondary | ICD-10-CM

## 2017-11-05 MED ORDER — OXYCODONE-ACETAMINOPHEN 10-325 MG PO TABS: 1.0000 | ORAL_TABLET | Freq: Three times a day (TID) | ORAL | 0 refills | Status: DC | PRN

## 2017-11-05 MED ORDER — NALOXONE HCL 4 MG/0.1ML NA LIQD: 1.0000 | Freq: Once | NASAL | 2 refills | Status: AC

## 2017-11-06 ENCOUNTER — Ambulatory Visit: Payer: Medicare Other | Admitting: Internal Medicine

## 2017-11-06 NOTE — Telephone Encounter (Signed)
MD approved and sent to pof../lmb 

## 2017-11-27 NOTE — Progress Notes (Signed)
[name] [address] [place], [place] [address] Phone: [phone] Subjective:      CC: Right shoulder pain  PFX:[id number]  Adrian Miller is a 77 y.o. male coming in with complaint of right shoulder pain. States he has pain today.  No new injury cuff arthropathy.  Affecting daily activities.  When getting up at night.  Affecting daily activities. Since we have seen patient patient also had a cholecystectomy and states that his abdominal and back pain seems to be a little bit better.    Past Medical History:  Diagnosis Date  . Acute esophagitis 06/01/2015  . Benign essential HTN 05/31/2015  . Duodenal ulcer hemorrhage   . Esophageal stricture 06/01/2015  . Hiatal hernia 06/01/2015   Past Surgical History:  Procedure Laterality Date  . CHOLECYSTECTOMY N/A 09/30/2017   Procedure: LAPAROSCOPIC CHOLECYSTECTOMY WITH INTRAOPERATIVE CHOLANGIOGRAM;  Surgeon: Excell Seltzer, MD;  Location: [facility];  Service: General;  Laterality: N/A;  . ESOPHAGOGASTRODUODENOSCOPY N/A 05/31/2015   Procedure: ESOPHAGOGASTRODUODENOSCOPY (EGD);  Surgeon: Ladene Artist, MD;  Location: [facility];  Service: Endoscopy;  Laterality: N/A;  . HERNIA REPAIR  1975  . LEFT ROTATOR CUFF REPAIR  2002  . REPLACEMENT TOTAL KNEE Left   . TOTAL KNEE ARTHROPLASTY Right 02/22/2015   Procedure: TOTAL KNEE ARTHROPLASTY;  Surgeon: Melrose Nakayama, MD;  Location: [facility];  Service: Orthopedics;  Laterality: Right;   Social History   Socioeconomic History  . Marital status: Single    Spouse name: Not on file  . Number of children: Not on file  . Years of education: Not on file  . Highest education level: Not on file  Occupational History  . Not on file  Social Needs  . Financial resource strain: Not on file  . Food insecurity:    Worry: Not on file    Inability: Not on file  . Transportation needs:    Medical: Not on file    Non-medical: Not on file  Tobacco Use  . Smoking status:  Current Every Day Smoker    Packs/day: 0.50    Years: 51.00    Pack years: 25.50    Types: Cigarettes  . Smokeless tobacco: Former Systems developer    Quit date: 11/03/2014  . Tobacco comment: quit 2 weeks ago  Substance and Sexual Activity  . Alcohol use: No    Alcohol/week: 0.0 oz  . Drug use: No  . Sexual activity: Never  Lifestyle  . Physical activity:    Days per week: Not on file    Minutes per session: Not on file  . Stress: Not on file  Relationships  . Social connections:    Talks on phone: Not on file    Gets together: Not on file    Attends religious service: Not on file    Active member of club or organization: Not on file    Attends meetings of clubs or organizations: Not on file    Relationship status: Not on file  Other Topics Concern  . Not on file  Social History Narrative  . Not on file   Allergies  Allergen Reactions  . Asa [Aspirin]     angioedema   Family History  Problem Relation Age of Onset  . Hypertension Mother   . Heart attack Sister      Past medical history, social, surgical and family history all reviewed in electronic medical record.  No pertanent information unless stated regarding to the chief complaint.  Review of Systems:Review of systems updated and as accurate as of 11/29/17  No headache, visual changes, nausea, vomiting, diarrhea, constipation, dizziness, abdominal pain, skin rash, fevers, chills, night sweats, weight loss, swollen lymph nodes, body aches, joint swelling, chest pain, shortness of breath, mood changes.  Positive muscle aches  Objective  Blood pressure (!) 160/90, pulse 87, height 5\' 9"  (1.753 m), weight 195 lb (88.5 kg), SpO2 94 %. Systems examined below as of 11/29/17   General: No apparent distress alert and oriented x3 mood and affect normal, dressed appropriately.  HEENT: Pupils equal, extraocular movements intact  Respiratory: Patient's speak in full sentences and does not appear short of breath  Cardiovascular: No  lower extremity edema, non tender, no erythema  Skin: Warm dry intact with no signs of infection or rash on extremities or on axial skeleton.  Abdomen: Soft nontender  Neuro: Cranial nerves II through XII are intact, neurovascularly intact in all extremities with 2+ DTRs and 2+ pulses.  Lymph: No lymphadenopathy of posterior or anterior cervical chain or axillae bilaterally.  Gait antalgicantalgic  MSK:  tender with limited range of motion and good stability and symmetric strength and tone of , elbows, wrist, knee and ankles bilaterally.  Severe arthritis of the hip carpal tunnel signs with positive Tinel's noted of the left wrist Patient's right shoulder shows significant atrophy.  Significant crepitus with range of motion.  Limited range of motion in all planes.  Rotator cuff strength 3 out of 5 compared to the contralateral side  Procedure: Real-time Ultrasound Guided Injection of right glenohumeral joint Device: GE Logiq Q7  Ultrasound guided injection is preferred based studies that show increased duration, increased effect, greater accuracy, decreased procedural pain, increased response rate with ultrasound guided versus blind injection.  Verbal informed consent obtained.  Time-out conducted.  Noted no overlying erythema, induration, or other signs of local infection.  Skin prepped in a sterile fashion.  Local anesthesia: Topical Ethyl chloride.  With sterile technique and under real time ultrasound guidance:  Joint visualized.  23g 1  inch needle inserted posterior approach. Pictures taken for needle placement. Patient did have injection of 2 cc of 1% lidocaine, 2 cc of 0.5% Marcaine, and 1.0 cc of Kenalog 40 mg/dL. Completed without difficulty  Pain immediately resolved suggesting accurate placement of the medication.  Advised to call if fevers/chills, erythema, induration, drainage, or persistent bleeding.  Images permanently stored and available for review in the ultrasound unit.    Impression: Technically successful ultrasound guided injection.     Impression and Recommendations:     This case required medical decision making of moderate complexity.      Note: This dictation was prepared with Dragon dictation along with smaller phrase technology. Any transcriptional errors that result from this process are unintentional.

## 2017-11-29 ENCOUNTER — Encounter: Payer: Self-pay | Admitting: Family Medicine

## 2017-11-29 ENCOUNTER — Ambulatory Visit: Payer: Self-pay

## 2017-11-29 ENCOUNTER — Ambulatory Visit: Payer: Medicare Other | Admitting: Family Medicine

## 2017-11-29 VITALS — BP 160/90 | HR 87 | Ht 69.0 in | Wt 195.0 lb

## 2017-11-29 DIAGNOSIS — M25511 Pain in right shoulder: Secondary | ICD-10-CM | POA: Diagnosis not present

## 2017-11-29 DIAGNOSIS — G8929 Other chronic pain: Secondary | ICD-10-CM

## 2017-11-29 DIAGNOSIS — M75101 Unspecified rotator cuff tear or rupture of right shoulder, not specified as traumatic: Secondary | ICD-10-CM

## 2017-11-29 DIAGNOSIS — M12811 Other specific arthropathies, not elsewhere classified, right shoulder: Secondary | ICD-10-CM | POA: Diagnosis not present

## 2017-11-29 NOTE — Patient Instructions (Addendum)
Good to see you  Adrian Miller is your friend.  Injected right shoulder  See em again in 4 weeks and we will look at the wrist and the possibly hip

## 2017-11-30 ENCOUNTER — Encounter: Payer: Self-pay | Admitting: Family Medicine

## 2017-11-30 NOTE — Assessment & Plan Note (Signed)
Injected.  Tolerated the procedure well.  Discussed icing regimen and home exercise.  Discussed which showed this to avoid.  Patient will need a replacement but does not want to do that.  Follow-up again in 10 weeks

## 2017-12-03 ENCOUNTER — Other Ambulatory Visit: Payer: Self-pay | Admitting: Internal Medicine

## 2017-12-03 DIAGNOSIS — M159 Polyosteoarthritis, unspecified: Secondary | ICD-10-CM

## 2017-12-03 DIAGNOSIS — M5416 Radiculopathy, lumbar region: Secondary | ICD-10-CM

## 2017-12-03 NOTE — Telephone Encounter (Unsigned)
Copied from [place] [id number]. Topic: Quick Communication - Rx Refill/Question >> Dec 03, 2017 11:28 AM Yvette Rack wrote: Medication: oxyCODONE-acetaminophen (PERCOCET) 10-325 MG tablet  Has the patient contacted their pharmacy? No  Preferred Pharmacy (with phone number or street name): [id number] [address], [place] [phone] (Phone) [phone] (Fax)   Agent: Please be advised that RX refills may take up to 3 business days. We ask that you follow-up with your pharmacy.

## 2017-12-03 NOTE — Telephone Encounter (Signed)
Percocet refill Last Refill:11/05/17 #90 Last OV: 10/17/17 PCP: Dr. Ronnald Ramp [facility]

## 2017-12-04 MED ORDER — OXYCODONE-ACETAMINOPHEN 10-325 MG PO TABS: 1.0000 | ORAL_TABLET | Freq: Three times a day (TID) | ORAL | 0 refills | Status: DC | PRN

## 2017-12-04 NOTE — Telephone Encounter (Signed)
Done erx 

## 2017-12-04 NOTE — Telephone Encounter (Signed)
Can you advise in PCP absence.  

## 2017-12-16 ENCOUNTER — Telehealth: Payer: Self-pay | Admitting: Internal Medicine

## 2017-12-16 DIAGNOSIS — I10 Essential (primary) hypertension: Secondary | ICD-10-CM

## 2017-12-16 NOTE — Telephone Encounter (Signed)
Copied from [place] [id number]. Topic: Quick Communication - See Telephone Encounter >> Dec 16, 2017  2:59 PM Hewitt Shorts wrote: [facility] [address] is [facility] to say that all of the types of irbesartan are on back order and they need a new substitute every time they have sent over a change it is still a type of irbesartan   Best number [phone]

## 2017-12-18 ENCOUNTER — Other Ambulatory Visit: Payer: Self-pay | Admitting: Internal Medicine

## 2017-12-18 DIAGNOSIS — I10 Essential (primary) hypertension: Secondary | ICD-10-CM

## 2017-12-18 MED ORDER — OLMESARTAN MEDOXOMIL 40 MG PO TABS
40.0000 mg | ORAL_TABLET | Freq: Every day | ORAL | 1 refills | Status: DC
Start: 2017-12-18 — End: 2018-12-02

## 2017-12-18 NOTE — Telephone Encounter (Signed)
PCP sent change on 10/19/2017. Contacted pharmacy and informed of same. Pharmacy stated that they never received the rx. Rx has been resent.   Tried to call pt but when I call someone picked up and then hung up before speaking.

## 2018-01-02 ENCOUNTER — Telehealth: Payer: Self-pay | Admitting: Internal Medicine

## 2018-01-02 ENCOUNTER — Other Ambulatory Visit: Payer: Self-pay | Admitting: Internal Medicine

## 2018-01-02 DIAGNOSIS — M5416 Radiculopathy, lumbar region: Secondary | ICD-10-CM

## 2018-01-02 DIAGNOSIS — M159 Polyosteoarthritis, unspecified: Secondary | ICD-10-CM

## 2018-01-02 MED ORDER — OXYCODONE-ACETAMINOPHEN 10-325 MG PO TABS: 1.0000 | ORAL_TABLET | Freq: Three times a day (TID) | ORAL | 0 refills | Status: DC | PRN

## 2018-01-02 NOTE — Telephone Encounter (Signed)
Pt needs a 3 month follow up before I can request refill from PCP. PCP will not be here tomorrow.

## 2018-01-02 NOTE — Telephone Encounter (Signed)
Copied from [place] [id number]. Topic: Quick Communication - Rx Refill/Question >> Jan 02, 2018  1:09 PM Wynetta Emery, [place] C wrote: Medication: oxyCODONE-acetaminophen (PERCOCET) 10-325 MG tablet   Has the patient contacted their pharmacy? no  (Agent: If no, request that the patient contact the pharmacy for the refill.) (Agent: If yes, when and what did the pharmacy advise?)  Preferred Pharmacy (with phone number or street name): [id number] [address], [place] [phone] (Phone) [phone] (Fax)    Agent: Please be advised that RX refills may take up to 3 business days. We ask that you follow-up with your pharmacy.

## 2018-01-02 NOTE — Telephone Encounter (Signed)
Pt rq rx for oxycodone. Pt has an upcoming appt on 01/22/2018. Please advise.

## 2018-01-02 NOTE — Telephone Encounter (Signed)
oxycodone-acetaminophen 10-325 refill Last Refill: 12/04/17 # 90 Last OV:09/03/17 PCP: Dr Scarlette Calico [facility] [place] [place]

## 2018-01-02 NOTE — Telephone Encounter (Signed)
Appt made

## 2018-01-22 ENCOUNTER — Encounter: Payer: Self-pay | Admitting: Internal Medicine

## 2018-01-22 ENCOUNTER — Other Ambulatory Visit (INDEPENDENT_AMBULATORY_CARE_PROVIDER_SITE_OTHER): Payer: Medicare Other

## 2018-01-22 ENCOUNTER — Ambulatory Visit: Payer: Medicare Other | Admitting: Internal Medicine

## 2018-01-22 VITALS — BP 144/72 | HR 92 | Temp 98.3°F | Resp 16 | Ht 69.0 in | Wt 186.2 lb

## 2018-01-22 DIAGNOSIS — D508 Other iron deficiency anemias: Secondary | ICD-10-CM

## 2018-01-22 DIAGNOSIS — D473 Essential (hemorrhagic) thrombocythemia: Secondary | ICD-10-CM | POA: Diagnosis not present

## 2018-01-22 DIAGNOSIS — D510 Vitamin B12 deficiency anemia due to intrinsic factor deficiency: Secondary | ICD-10-CM | POA: Diagnosis not present

## 2018-01-22 DIAGNOSIS — Z79891 Long term (current) use of opiate analgesic: Secondary | ICD-10-CM | POA: Diagnosis not present

## 2018-01-22 DIAGNOSIS — I10 Essential (primary) hypertension: Secondary | ICD-10-CM

## 2018-01-22 DIAGNOSIS — Z23 Encounter for immunization: Secondary | ICD-10-CM

## 2018-01-22 DIAGNOSIS — I739 Peripheral vascular disease, unspecified: Secondary | ICD-10-CM | POA: Insufficient documentation

## 2018-01-22 DIAGNOSIS — D75839 Thrombocytosis, unspecified: Secondary | ICD-10-CM

## 2018-01-22 LAB — CBC WITH DIFFERENTIAL/PLATELET
Basophils Absolute: 0.1 10*3/uL (ref 0.0–0.1)
Basophils Relative: 0.7 % (ref 0.0–3.0)
Eosinophils Absolute: 0.2 10*3/uL (ref 0.0–0.7)
Eosinophils Relative: 1.5 % (ref 0.0–5.0)
HCT: 45 % (ref 39.0–52.0)
Hemoglobin: 15.7 g/dL (ref 13.0–17.0)
Lymphocytes Relative: 20.9 % (ref 12.0–46.0)
Lymphs Abs: 2.1 10*3/uL (ref 0.7–4.0)
MCHC: 34.9 g/dL (ref 30.0–36.0)
MCV: 90.3 fl (ref 78.0–100.0)
Monocytes Absolute: 0.9 10*3/uL (ref 0.1–1.0)
Monocytes Relative: 9 % (ref 3.0–12.0)
Neutro Abs: 6.8 10*3/uL (ref 1.4–7.7)
Neutrophils Relative %: 67.9 % (ref 43.0–77.0)
Platelets: 372 10*3/uL (ref 150.0–400.0)
RBC: 4.98 Mil/uL (ref 4.22–5.81)
RDW: 13.7 % (ref 11.5–15.5)
WBC: 10 10*3/uL (ref 4.0–10.5)

## 2018-01-22 LAB — FERRITIN: Ferritin: 97.6 ng/mL (ref 22.0–322.0)

## 2018-01-22 LAB — IBC PANEL
Iron: 89 ug/dL (ref 42–165)
Saturation Ratios: 28.4 % (ref 20.0–50.0)
Transferrin: 224 mg/dL (ref 212.0–360.0)

## 2018-01-22 MED ORDER — CYANOCOBALAMIN 1000 MCG/ML IJ SOLN
1000.0000 ug | Freq: Once | INTRAMUSCULAR | Status: AC
  Administered 2018-01-22: 1000 ug via INTRAMUSCULAR

## 2018-01-22 NOTE — Progress Notes (Signed)
Subjective:  Patient ID: Adrian Miller, male    DOB: 20-Oct-1940  Age: 77 y.o. MRN: [id number]  CC: Anemia   HPI STCLAIR SZYMBORSKI presents for f/up - He complains of a cold sensation in his legs below his knees.  He says this has been worsening over the last few years after he had knee replacement surgery.  He says the cold sensation is not associated with or without activity.  He denies claudication type symptoms.  He tells me he is taking the iron supplement.  Outpatient Medications Prior to Visit  Medication Sig Dispense Refill  . atorvastatin (LIPITOR) 20 MG tablet Take 1 tablet (20 mg total) by mouth daily. 90 tablet 1  . chlorthalidone (HYGROTON) 25 MG tablet Take 1 tablet (25 mg total) by mouth daily. 90 tablet 0  . ferrous sulfate 325 (65 FE) MG tablet Take 1 tablet (325 mg total) by mouth 3 (three) times daily with meals. 90 tablet 5  . olmesartan (BENICAR) 40 MG tablet Take 1 tablet (40 mg total) by mouth daily. 90 tablet 1  . oxyCODONE-acetaminophen (PERCOCET) 10-325 MG tablet Take 1 tablet by mouth every 8 (eight) hours as needed for pain. 90 tablet 0  . pantoprazole (PROTONIX) 40 MG tablet Take 1 tablet (40 mg total) by mouth daily. 90 tablet 1   No facility-administered medications prior to visit.     ROS Review of Systems  Constitutional: Negative.  Negative for diaphoresis and fatigue.  HENT: Negative.   Eyes: Negative for visual disturbance.  Respiratory: Negative for cough, chest tightness, shortness of breath and wheezing.   Cardiovascular: Negative for chest pain, palpitations and leg swelling.  Gastrointestinal: Negative for abdominal pain, diarrhea and nausea.  Genitourinary: Negative.  Negative for difficulty urinating and urgency.  Musculoskeletal: Positive for arthralgias. Negative for joint swelling and myalgias.  Skin: Negative.   Neurological: Negative.  Negative for dizziness and light-headedness.  Hematological: Negative for adenopathy. Does not  bruise/bleed easily.  Psychiatric/Behavioral: Negative.     Objective:  BP (!) 144/72 (BP Location: Left Arm, Patient Position: Sitting, Cuff Size: Normal)   Pulse 92   Temp 98.3 F (36.8 C) (Oral)   Resp 16   Ht 5\' 9"  (1.753 m)   Wt 186 lb 4 oz (84.5 kg)   SpO2 96%   BMI 27.50 kg/m   BP Readings from Last 3 Encounters:  01/22/18 (!) 144/72  11/29/17 (!) 160/90  10/17/17 120/60    Wt Readings from Last 3 Encounters:  01/22/18 186 lb 4 oz (84.5 kg)  11/29/17 195 lb (88.5 kg)  10/17/17 191 lb 12 oz (87 kg)    Physical Exam  Constitutional: He is oriented to person, place, and time. No distress.  HENT:  Mouth/Throat: Oropharynx is clear and moist. No oropharyngeal exudate.  Eyes: Conjunctivae are normal. No scleral icterus.  Neck: Normal range of motion. Neck supple. No JVD present. No thyromegaly present.  Cardiovascular: Normal rate, regular rhythm and normal heart sounds. Exam reveals no gallop and no friction rub.  No murmur heard. Pulses:      Carotid pulses are 1+ on the right side, and 1+ on the left side.      Radial pulses are 1+ on the right side, and 1+ on the left side.       Femoral pulses are 1+ on the right side, and 1+ on the left side.      Popliteal pulses are 0 on the right side, and 0 on  the left side.       Dorsalis pedis pulses are 0 on the right side, and 0 on the left side.       Posterior tibial pulses are 0 on the right side, and 0 on the left side.  Pulmonary/Chest: Effort normal and breath sounds normal. He has no wheezes. He has no rales.  Abdominal: Soft. Bowel sounds are normal. He exhibits no mass. There is no hepatosplenomegaly. There is no tenderness.  Musculoskeletal: Normal range of motion. He exhibits no edema, tenderness or deformity.  Lymphadenopathy:    He has no cervical adenopathy.  Neurological: He is alert and oriented to person, place, and time.  Skin: Skin is warm and dry. No rash noted. He is not diaphoretic.  Vitals  reviewed.   Lab Results  Component Value Date   WBC 10.0 01/22/2018   HGB 15.7 01/22/2018   HCT 45.0 01/22/2018   PLT 372.0 01/22/2018   GLUCOSE 100 (H) 10/17/2017   CHOL 147 10/17/2017   TRIG 243.0 (H) 10/17/2017   HDL 30.50 (L) 10/17/2017   LDLDIRECT 40.0 10/17/2017   LDLCALC 67 11/26/2016   ALT 56 10/05/2017   AST 20 10/05/2017   NA 138 10/17/2017   K 3.9 10/17/2017   CL 101 10/17/2017   CREATININE 1.04 10/17/2017   BUN 17 10/17/2017   CO2 28 10/17/2017   TSH 1.42 11/26/2016   PSA 0.87 03/18/2014   INR 1.08 02/11/2015   HGBA1C 6.2 08/09/2017    Ct Abdomen Pelvis W Contrast  Result Date: 09/28/2017 CLINICAL DATA:  Patient does have history of ulcer disease and acute esophagitis. He says this feels different than that. He reports about 4 AM he woke up and that pain is been continued since then. Patient took his chronic pain medication at home and did not get better. On arrival patient had a 10 out of 10 pain, however it is since feels better after lying still EXAM: CT ABDOMEN AND PELVIS WITH CONTRAST TECHNIQUE: Multidetector CT imaging of the abdomen and pelvis was performed using the standard protocol following bolus administration of intravenous contrast. CONTRAST:  <See Chart> ISOVUE-300 IOPAMIDOL (ISOVUE-300) INJECTION 61% COMPARISON:  None. FINDINGS: Lower chest: No acute abnormality. Hepatobiliary: Liver normal in size and attenuation. No mass or focal lesions. There is mild intra and extrahepatic bile duct dilation. Common bile duct measures 1 cm proximally, but shows normal distal tapering. No evidence of a duct stone. Gallbladder is moderately distended. There are gallstones. No wall thickening. No adjacent inflammation. Pancreas: Unremarkable. No pancreatic ductal dilatation or surrounding inflammatory changes. Spleen: Normal in size without focal abnormality. Adrenals/Urinary Tract: No adrenal masses. Multiple bilateral low-density nonenhancing renal masses, largest arising  from the anteromedial mid to lower pole of the left kidney measuring 5.1 cm. These are all consistent with cysts. No renal stones. No hydronephrosis. Ureters are normal course and in caliber. Bladder is unremarkable. Stomach/Bowel: Stomach is unremarkable. Small bowel is normal caliber with no wall thickening or inflammation. There are numerous colonic diverticula. No evidence of diverticulitis or other colon inflammatory process. Appendix is normal. Vascular/Lymphatic: There are several prominent to mildly enlarged gastrohepatic ligament lymph nodes, largest measuring 11 mm in short axis. No other adenopathy. Atherosclerotic disease noted along the abdominal aorta and iliac vessels. No aneurysm. Reproductive: Prostate is mildly enlarged measuring 4.8 cm maximum transverse dimension. Other: No abdominal wall hernia or abnormality. No abdominopelvic ascites. Musculoskeletal: No fracture or acute finding. No osteoblastic or osteolytic lesions. There are significant degenerative  changes of the mid to lower lumbar spine. IMPRESSION: 1. No acute findings within the abdomen or pelvis. 2. Extensive colonic diverticulosis with no evidence of diverticulitis. 3. Mild intra and extrahepatic bile duct dilation. No evidence bile duct obstruction. This is most likely chronic. 4. Gallstones.  No acute cholecystitis. 5. Multiple renal cysts. 6. Aortic atherosclerosis. Electronically Signed   By: Lajean Manes M.D.   On: 09/28/2017 17:29   Mr 3d Recon At Scanner  Result Date: 09/28/2017 CLINICAL DATA:  Abdominal pain. Abnormal liver function studies. Epigastric pain and vomiting. EXAM: MRI ABDOMEN WITHOUT AND WITH CONTRAST (INCLUDING MRCP) TECHNIQUE: Multiplanar multisequence MR imaging of the abdomen was performed both before and after the administration of intravenous contrast. Heavily T2-weighted images of the biliary and pancreatic ducts were obtained, and three-dimensional MRCP images were rendered by post processing.  CONTRAST:  39mL MULTIHANCE GADOBENATE DIMEGLUMINE 529 MG/ML IV SOLN COMPARISON:  CT abdomen and pelvis 09/28/2017 FINDINGS: Examination is technically limited due to motion artifact. Many sequences are nondiagnostic due to motion artifact. Lower chest: No specific acute abnormality identified. Hepatobiliary: Cholelithiasis with multiple large stones in the gallbladder. No inflammatory changes to suggest cholecystitis. Mild bile duct dilatation is demonstrated. No obstructing mass lesion or stone is identified. No focal liver lesions. Pancreas: No mass, inflammatory changes, or other parenchymal abnormality identified. No pancreatic ductal dilatation. Spleen:  Within normal limits in size and appearance. Adrenals/Urinary Tract: No adrenal gland nodules. Multiple bilateral renal parenchymal cysts. No hydronephrosis or solid mass identified. Stomach/Bowel: Visualized stomach, small bowel, and colon are not abnormally distended and no wall thickening is appreciated. Vascular/Lymphatic: Normal caliber abdominal aorta. No significant lymphadenopathy. Other:  None. Musculoskeletal: No suspicious bone lesions identified. IMPRESSION: 1. Cholelithiasis with large stones in the gallbladder. No evidence of cholecystitis. 2. Mild bile duct dilatation.  No cause identified. 3. Bilateral renal cysts.  No hydronephrosis. 4. Examination is technically limited due to motion artifact. Some sequences are nondiagnostic. Electronically Signed   By: Lucienne Capers M.D.   On: 09/28/2017 23:32   Dg Chest Portable 1 View  Result Date: 09/28/2017 CLINICAL DATA:  Chest pain, shortness of breath and chest tightness. EXAM: PORTABLE CHEST 1 VIEW COMPARISON:  03/12/2017 FINDINGS: Cardiomediastinal silhouette is normal. Mediastinal contours appear intact. Tortuosity of the aorta. There is no evidence of focal airspace consolidation, pleural effusion or pneumothorax. Osseous structures are without acute abnormality. Soft tissues are grossly  normal. IMPRESSION: No active disease. Electronically Signed   By: Fidela Salisbury M.D.   On: 09/28/2017 15:39   Mr Abdomen Mrcp Moise Boring Contast  Result Date: 09/28/2017 CLINICAL DATA:  Abdominal pain. Abnormal liver function studies. Epigastric pain and vomiting. EXAM: MRI ABDOMEN WITHOUT AND WITH CONTRAST (INCLUDING MRCP) TECHNIQUE: Multiplanar multisequence MR imaging of the abdomen was performed both before and after the administration of intravenous contrast. Heavily T2-weighted images of the biliary and pancreatic ducts were obtained, and three-dimensional MRCP images were rendered by post processing. CONTRAST:  89mL MULTIHANCE GADOBENATE DIMEGLUMINE 529 MG/ML IV SOLN COMPARISON:  CT abdomen and pelvis 09/28/2017 FINDINGS: Examination is technically limited due to motion artifact. Many sequences are nondiagnostic due to motion artifact. Lower chest: No specific acute abnormality identified. Hepatobiliary: Cholelithiasis with multiple large stones in the gallbladder. No inflammatory changes to suggest cholecystitis. Mild bile duct dilatation is demonstrated. No obstructing mass lesion or stone is identified. No focal liver lesions. Pancreas: No mass, inflammatory changes, or other parenchymal abnormality identified. No pancreatic ductal dilatation. Spleen:  Within normal limits  in size and appearance. Adrenals/Urinary Tract: No adrenal gland nodules. Multiple bilateral renal parenchymal cysts. No hydronephrosis or solid mass identified. Stomach/Bowel: Visualized stomach, small bowel, and colon are not abnormally distended and no wall thickening is appreciated. Vascular/Lymphatic: Normal caliber abdominal aorta. No significant lymphadenopathy. Other:  None. Musculoskeletal: No suspicious bone lesions identified. IMPRESSION: 1. Cholelithiasis with large stones in the gallbladder. No evidence of cholecystitis. 2. Mild bile duct dilatation.  No cause identified. 3. Bilateral renal cysts.  No hydronephrosis. 4.  Examination is technically limited due to motion artifact. Some sequences are nondiagnostic. Electronically Signed   By: Lucienne Capers M.D.   On: 09/28/2017 23:32   US Abdomen Limited Ruq  Result Date: 09/28/2017 CLINICAL DATA:  Right upper quadrant abdominal pain beginning this morning. EXAM: ULTRASOUND ABDOMEN LIMITED RIGHT UPPER QUADRANT COMPARISON:  Current abdomen pelvis CT FINDINGS: Gallbladder: Gallstones, largest measuring just under 3 cm. Stones cause posterior acoustic shadowing somewhat limiting evaluation of the gallbladder. No gallbladder wall thickening or pericholecystic fluid. There is some sludge. Common bile duct: Diameter: 10 mm.  No sonographic evidence of a duct stone. Liver: Normal size and parenchymal echogenicity. No mass or focal lesion. Mild central intrahepatic bile duct dilation. Portal vein is patent on color Doppler imaging with normal direction of blood flow towards the liver. IMPRESSION: 1. Gallstones without evidence of acute cholecystitis. 2. Mild intra and extrahepatic bile duct dilation as noted on the current CT. No sonographic evidence of a duct stone. Electronically Signed   By: Lajean Manes M.D.   On: 09/28/2017 19:04    Assessment & Plan:   Zohan was seen today for anemia.  Diagnoses and all orders for this visit:  Thrombocytosis ([place])- His platelet count has normalized now that the B12 and iron deficiencies have been treated. -     CBC with Differential/Platelet; Future  Benign essential HTN- His blood pressure is adequately well controlled.  Vitamin B12 deficiency anemia due to intrinsic factor deficiency -     CBC with Differential/Platelet; Future -     cyanocobalamin ((VITAMIN B-12)) injection 1,000 mcg  Other iron deficiency anemias- His H&H and iron levels are normal now.  Will continue iron replacement therapy. -     CBC with Differential/Platelet; Future -     Ferritin; Future -     IBC panel; Future  Need for influenza vaccination -      Flu vaccine HIGH DOSE PF (Fluzone High dose)  Encounter for long-term use of opiate analgesic -     Pain Mgmt, Profile 8 w/Conf, U; Future  PAD (peripheral artery disease) ([place])- He has lower extremity symptoms and I cannot feel the pulses in his feet.  I have asked him to undergo ABIs to evaluate for PAD. -     VAS [place] ABI WITH/WO TBI; Future   I am having Nayquan L. Schreur maintain his ferrous sulfate, atorvastatin, chlorthalidone, pantoprazole, olmesartan, and oxyCODONE-acetaminophen. We administered cyanocobalamin.  Meds ordered this encounter  Medications  . cyanocobalamin ((VITAMIN B-12)) injection 1,000 mcg     Follow-up: Return in about 3 months (around 04/23/2018).  Scarlette Calico, MD

## 2018-01-22 NOTE — Patient Instructions (Signed)
Iron Deficiency Anemia, Adult Iron deficiency anemia is a condition in which the concentration of red blood cells or hemoglobin in the blood is below normal because of too little iron. Hemoglobin is a substance in red blood cells that carries oxygen to the body's tissues. When the concentration of red blood cells or hemoglobin is too low, not enough oxygen reaches these tissues. Iron deficiency anemia is usually long-lasting (chronic) and it develops over time. It may or may not cause symptoms. It is a common type of anemia. What are the causes? This condition may be caused by:  Not enough iron in the diet.  Blood loss caused by bleeding in the intestine.  Blood loss from a gastrointestinal condition like Crohn disease.  Frequent blood draws, such as from blood donation.  Abnormal absorption in the gut.  Heavy menstrual periods in women.  Cancers of the gastrointestinal system, such as colon cancer.  What are the signs or symptoms? Symptoms of this condition may include:  Fatigue.  Headache.  Pale skin, lips, and nail beds.  Poor appetite.  Weakness.  Shortness of breath.  Dizziness.  Cold hands and feet.  Fast or irregular heartbeat.  Irritability. This is more common in severe anemia.  Rapid breathing. This is more common in severe anemia.  Mild anemia may not cause any symptoms. How is this diagnosed? This condition is diagnosed based on:  Your medical history.  A physical exam.  Blood tests.  You may have additional tests to find the underlying cause of your anemia, such as:  Testing for blood in the stool (fecal occult blood test).  A procedure to see inside your colon and rectum (colonoscopy).  A procedure to see inside your esophagus and stomach (endoscopy).  A test in which cells are removed from bone marrow (bone marrow aspiration) or fluid is removed from the bone marrow to be examined (biopsy). This is rarely needed.  How is this  treated? This condition is treated by correcting the cause of your iron deficiency. Treatment may involve:  Adding iron-rich foods to your diet.  Taking iron supplements. If you are pregnant or breastfeeding, you may need to take extra iron because your normal diet usually does not provide the amount of iron that you need.  Increasing vitamin C intake. Vitamin C helps your body absorb iron. Your health care provider may recommend that you take iron supplements along with a glass of orange juice or a vitamin C supplement.  Medicines to make heavy menstrual flow lighter.  Surgery.  You may need repeat blood tests to determine whether treatment is working. Depending on the underlying cause, the anemia should be corrected within 2 months of starting treatment. If the treatment does not seem to be working, you may need more testing. Follow these instructions at home: Medicines  Take over-the-counter and prescription medicines only as told by your health care provider. This includes iron supplements and vitamins.  If you cannot tolerate taking iron supplements by mouth, talk with your health care provider about taking them through a vein (intravenously) or an injection into a muscle.  For the best iron absorption, you should take iron supplements when your stomach is empty. If you cannot tolerate them on an empty stomach, you may need to take them with food.  Do not drink milk or take antacids at the same time as your iron supplements. Milk and antacids may interfere with iron absorption.  Iron supplements can cause constipation. To prevent constipation, include fiber   in your diet as told by your health care provider. A stool softener may also be recommended. Eating and drinking  Talk with your health care provider before changing your diet. He or she may recommend that you eat foods that contain a lot of iron, such as: ? Liver. ? Low-fat (lean) beef. ? Breads and cereals that have iron  added to them (are fortified). ? Eggs. ? Dried fruit. ? Dark green, leafy vegetables.  To help your body use the iron from iron-rich foods, eat those foods at the same time as fresh fruits and vegetables that are high in vitamin C. Foods that are high in vitamin C include: ? Oranges. ? Peppers. ? Tomatoes. ? Mangoes.  Drinkenoughfluid to keep your urine clear or pale yellow. General instructions  Return to your normal activities as told by your health care provider. Ask your health care provider what activities are safe for you.  Practice good hygiene. Anemia can make you more prone to illness and infection.  Keep all follow-up visits as told by your health care provider. This is important. Contact a health care provider if:  You feel nauseous or you vomit.  You feel weak.  You have unexplained sweating.  You develop symptoms of constipation, such as: ? Having fewer than three bowel movements a week. ? Straining to have a bowel movement. ? Having stools that are hard, dry, or larger than normal. ? Feeling full or bloated. ? Pain in the lower abdomen. ? Not feeling relief after having a bowel movement. Get help right away if:  You faint. If this happens, do not drive yourself to the hospital. Call your local emergency services (911 in the [place]  You have chest pain.  You have shortness of breath that: ? Is severe. ? Gets worse with physical activity.  You have a rapid heartbeat.  You become light-headed when getting up from a sitting or lying down position. This information is not intended to replace advice given to you by your health care provider. Make sure you discuss any questions you have with your health care provider. Document Released: 04/20/2000 Document Revised: 01/11/2016 Document Reviewed: 01/11/2016 Elsevier Interactive Patient Education  2018 [employer] Inc[employer]  

## 2018-01-25 LAB — PAIN MGMT, PROFILE 8 W/CONF, U
6 Acetylmorphine: NEGATIVE ng/mL (ref <10)
Alcohol Metabolites: NEGATIVE ng/mL (ref <500)
Amphetamines: NEGATIVE ng/mL (ref <500)
Benzodiazepines: NEGATIVE ng/mL (ref <100)
Buprenorphine, Urine: NEGATIVE ng/mL (ref <5)
Cocaine Metabolite: NEGATIVE ng/mL (ref <150)
Codeine: NEGATIVE ng/mL (ref <50)
Creatinine: 233.5 mg/dL
Hydrocodone: NEGATIVE ng/mL (ref <50)
Hydromorphone: NEGATIVE ng/mL (ref <50)
MDMA: NEGATIVE ng/mL (ref <500)
Marijuana Metabolite: 39 ng/mL — ABNORMAL HIGH (ref <5)
Marijuana Metabolite: POSITIVE ng/mL — AB (ref <20)
Morphine: NEGATIVE ng/mL (ref <50)
Norhydrocodone: NEGATIVE ng/mL (ref <50)
Noroxycodone: 3809 ng/mL — ABNORMAL HIGH (ref <50)
Opiates: NEGATIVE ng/mL (ref <100)
Oxidant: NEGATIVE ug/mL (ref <200)
Oxycodone: 1566 ng/mL — ABNORMAL HIGH (ref <50)
Oxycodone: POSITIVE ng/mL — AB (ref <100)
Oxymorphone: 3313 ng/mL — ABNORMAL HIGH (ref <50)
pH: 6.26 (ref 4.5–9.0)

## 2018-01-29 ENCOUNTER — Encounter: Payer: Self-pay | Admitting: Internal Medicine

## 2018-02-03 NOTE — Progress Notes (Signed)
[name] [address], [place] [address] Phone: [phone] Subjective:    I Kandace Blitz am serving as a Education administrator for Dr. Hulan Saas.    CC: Right shoulder pain and left wrist pain  EHM:[id number]  Adrian Miller is a 77 y.o. male coming in with complaint of right shoulder and left wrist pain. Here for injections today. Patient does have right shoulder pain.  Has been seen multiple times.  Has rotator cuff arthropathy.  Rates the severity pain is 9 out of 10      Past Medical History:  Diagnosis Date  . Acute esophagitis 06/01/2015  . Benign essential HTN 05/31/2015  . Duodenal ulcer hemorrhage   . Esophageal stricture 06/01/2015  . Hiatal hernia 06/01/2015   Past Surgical History:  Procedure Laterality Date  . CHOLECYSTECTOMY N/A 09/30/2017   Procedure: LAPAROSCOPIC CHOLECYSTECTOMY WITH INTRAOPERATIVE CHOLANGIOGRAM;  Surgeon: Excell Seltzer, MD;  Location: [facility];  Service: General;  Laterality: N/A;  . ESOPHAGOGASTRODUODENOSCOPY N/A 05/31/2015   Procedure: ESOPHAGOGASTRODUODENOSCOPY (EGD);  Surgeon: Ladene Artist, MD;  Location: [facility];  Service: Endoscopy;  Laterality: N/A;  . HERNIA REPAIR  1975  . LEFT ROTATOR CUFF REPAIR  2002  . REPLACEMENT TOTAL KNEE Left   . TOTAL KNEE ARTHROPLASTY Right 02/22/2015   Procedure: TOTAL KNEE ARTHROPLASTY;  Surgeon: Melrose Nakayama, MD;  Location: [facility];  Service: Orthopedics;  Laterality: Right;   Social History   Socioeconomic History  . Marital status: Single    Spouse name: Not on file  . Number of children: Not on file  . Years of education: Not on file  . Highest education level: Not on file  Occupational History  . Not on file  Social Needs  . Financial resource strain: Not on file  . Food insecurity:    Worry: Not on file    Inability: Not on file  . Transportation needs:    Medical: Not on file    Non-medical: Not on file  Tobacco Use  . Smoking status: Current  Every Day Smoker    Packs/day: 0.50    Years: 51.00    Pack years: 25.50    Types: Cigarettes  . Smokeless tobacco: Former Systems developer    Quit date: 11/03/2014  . Tobacco comment: quit 2 weeks ago  Substance and Sexual Activity  . Alcohol use: No    Alcohol/week: 0.0 standard drinks  . Drug use: No  . Sexual activity: Never  Lifestyle  . Physical activity:    Days per week: Not on file    Minutes per session: Not on file  . Stress: Not on file  Relationships  . Social connections:    Talks on phone: Not on file    Gets together: Not on file    Attends religious service: Not on file    Active member of club or organization: Not on file    Attends meetings of clubs or organizations: Not on file    Relationship status: Not on file  Other Topics Concern  . Not on file  Social History Narrative  . Not on file   Allergies  Allergen Reactions  . Asa [Aspirin]     angioedema   Family History  Problem Relation Age of Onset  . Hypertension Mother   . Heart attack Sister      Current Outpatient Medications (Cardiovascular):  .  atorvastatin (LIPITOR) 20 MG tablet, Take 1 tablet (20 mg total) by mouth daily. [name]  chlorthalidone (HYGROTON) 25 MG tablet, Take 1 tablet (25 mg total) by mouth daily. [name]  olmesartan (BENICAR) 40 MG tablet, Take 1 tablet (40 mg total) by mouth daily.   Current Outpatient Medications (Analgesics):  .  oxyCODONE-acetaminophen (PERCOCET) 10-325 MG tablet, Take 1 tablet by mouth every 8 (eight) hours as needed for pain.  Current Outpatient Medications (Hematological):  .  ferrous sulfate 325 (65 FE) MG tablet, Take 1 tablet (325 mg total) by mouth 3 (three) times daily with meals.  Current Outpatient Medications (Other):  .  pantoprazole (PROTONIX) 40 MG tablet, Take 1 tablet (40 mg total) by mouth daily.    Past medical history, social, surgical and family history all reviewed in electronic medical record.  No pertanent information unless stated regarding  to the chief complaint.   Review of Systems:  No headache, visual changes, nausea, vomiting, diarrhea, constipation, dizziness, abdominal pain, skin rash, fevers, chills, night sweats, weight loss, swollen lymph nodes, body aches, joint swelling,  chest pain, shortness of breath, mood changes.  Positive muscle aches  Objective  Blood pressure 120/80, pulse 72, height 5\' 9"  (1.753 m), weight 184 lb (83.5 kg), SpO2 92 %.    General: No apparent distress alert and oriented x3 mood and affect normal, dressed appropriately.  HEENT: Pupils equal, extraocular movements intact  Respiratory: Patient's speak in full sentences and does not appear short of breath  Cardiovascular: No lower extremity edema, non tender, no erythema  Skin: Warm dry intact with no signs of infection or rash on extremities or on axial skeleton.  Abdomen: Soft nontender  Neuro: Cranial nerves II through XII are intact, neurovascularly intact in all extremities with 2+ DTRs and 2+ pulses.  Lymph: No lymphadenopathy of posterior or anterior cervical chain or axillae bilaterally.  Gait antalgic gait walks with the aid of a cane MSK:  Non tender with full range of motion and good stability and symmetric strength and tone of  elbows, , hip, knee and ankles bilaterally.  Arthritic changes of multiple joints  Right shoulder exam shows significant crepitus with range of motion.  Patient has loss of range of motion in all planes.  4 out of 5 strength of the rotator cuff.  Positive impingement  Left wrist shows that patient does have arthritic changes but a positive Tinel sign.  Mild weakness in the C7 distribution.  Procedure: Real-time Ultrasound Guided Injection of the left carpal tunnel Device: GE Logiq Q7 Ultrasound guided injection is preferred based studies that show increased duration, increased effect, greater accuracy, decreased procedural pain, increased response rate with ultrasound guided versus blind injection.  Verbal  informed consent obtained.  Time-out conducted.  Noted no overlying erythema, induration, or other signs of local infection.  Skin prepped in a sterile fashion.  Local anesthesia: Topical Ethyl chloride.  With sterile technique and under real time ultrasound guidance:  median nerve visualized.  23g 5/8 inch needle inserted distal to proximal approach into nerve sheath. Pictures taken nfor needle placement. Patient did have injection of 2 cc of 0.5% Marcaine, and 1 cc of Kenalog 40 mg/dL. Completed without difficulty  Pain immediately resolved suggesting accurate placement of the medication.  Advised to call if fevers/chills, erythema, induration, drainage, or persistent bleeding.  Images permanently stored and available for review in the ultrasound unit.  Impression: Technically successful ultrasound guided injection.  Procedure: Real-time Ultrasound Guided Injection of right glenohumeral joint Device: GE Logiq Q7  Ultrasound guided injection is preferred based studies that show increased duration,  increased effect, greater accuracy, decreased procedural pain, increased response rate with ultrasound guided versus blind injection.  Verbal informed consent obtained.  Time-out conducted.  Noted no overlying erythema, induration, or other signs of local infection.  Skin prepped in a sterile fashion.  Local anesthesia: Topical Ethyl chloride.  With sterile technique and under real time ultrasound guidance:  Joint visualized.  23g 1  inch needle inserted posterior approach. Pictures taken for needle placement. Patient did have injection of 2 cc of 1% lidocaine, 2 cc of 0.5% Marcaine, and 1.0 cc of Kenalog 40 mg/dL. Completed without difficulty  Pain immediately resolved suggesting accurate placement of the medication.  Advised to call if fevers/chills, erythema, induration, drainage, or persistent bleeding.  Images permanently stored and available for review in the ultrasound unit.  Impression:  Technically successful ultrasound guided injection.   Impression and Recommendations:     This case required medical decision making of moderate complexity. The above documentation has been reviewed and is accurate and complete Lyndal Pulley, DO       Note: This dictation was prepared with Dragon dictation along with smaller phrase technology. Any transcriptional errors that result from this process are unintentional.

## 2018-02-05 ENCOUNTER — Ambulatory Visit: Payer: Medicare Other | Admitting: Family Medicine

## 2018-02-05 ENCOUNTER — Encounter: Payer: Self-pay | Admitting: Family Medicine

## 2018-02-05 ENCOUNTER — Ambulatory Visit: Payer: Self-pay

## 2018-02-05 VITALS — BP 120/80 | HR 72 | Ht 69.0 in | Wt 184.0 lb

## 2018-02-05 DIAGNOSIS — M75101 Unspecified rotator cuff tear or rupture of right shoulder, not specified as traumatic: Secondary | ICD-10-CM | POA: Diagnosis not present

## 2018-02-05 DIAGNOSIS — G8929 Other chronic pain: Secondary | ICD-10-CM

## 2018-02-05 DIAGNOSIS — M25511 Pain in right shoulder: Principal | ICD-10-CM

## 2018-02-05 DIAGNOSIS — M12811 Other specific arthropathies, not elsewhere classified, right shoulder: Secondary | ICD-10-CM

## 2018-02-05 DIAGNOSIS — G5602 Carpal tunnel syndrome, left upper limb: Secondary | ICD-10-CM | POA: Diagnosis not present

## 2018-02-05 NOTE — Patient Instructions (Addendum)
Good to see you  2 injections today  Ice is your friend See me again in 10 weeks

## 2018-02-05 NOTE — Assessment & Plan Note (Signed)
Repeat injection given today.  Tolerated the procedure well.  Discussed icing regimen and home exercise.  Discussed which activities to do which wants to avoid.

## 2018-02-05 NOTE — Assessment & Plan Note (Signed)
Repeat injection again today.  Worsening symptoms.  Patient is considering a shoulder replacement.  May be following up with the surgeon near future.  Affecting all daily activities and injections are not working 10 weeks.

## 2018-02-07 ENCOUNTER — Telehealth: Payer: Self-pay | Admitting: Internal Medicine

## 2018-02-07 DIAGNOSIS — M159 Polyosteoarthritis, unspecified: Secondary | ICD-10-CM

## 2018-02-07 DIAGNOSIS — M5416 Radiculopathy, lumbar region: Secondary | ICD-10-CM

## 2018-02-07 MED ORDER — OXYCODONE-ACETAMINOPHEN 10-325 MG PO TABS: 1.0000 | ORAL_TABLET | Freq: Three times a day (TID) | ORAL | 0 refills | Status: DC | PRN

## 2018-02-07 NOTE — Telephone Encounter (Signed)
Will send in one month; it looks like Dr. Ronnald Ramp wants him back in December.

## 2018-02-07 NOTE — Telephone Encounter (Signed)
Pt informed appt made

## 2018-02-07 NOTE — Telephone Encounter (Signed)
Copied from [place] [id number]. Topic: General - Other >> Feb 07, 2018 11:39 AM Lennox Solders wrote: Reason for CRM: pt is calling and needs refill on oxycodone . [facility] [address]

## 2018-02-07 NOTE — Telephone Encounter (Signed)
Controlled Substance Datatbase checked and last fill for oxycodone 10-325mg  rx rq was 01/02/2018 LOV was 01/22/2018 NOV is not scheduled  Can you advise in PCP absence?

## 2018-02-12 DIAGNOSIS — M5441 Lumbago with sciatica, right side: Secondary | ICD-10-CM | POA: Diagnosis not present

## 2018-02-20 DIAGNOSIS — M545 Low back pain: Secondary | ICD-10-CM | POA: Diagnosis not present

## 2018-03-04 ENCOUNTER — Other Ambulatory Visit: Payer: Self-pay | Admitting: Internal Medicine

## 2018-03-04 DIAGNOSIS — E785 Hyperlipidemia, unspecified: Secondary | ICD-10-CM

## 2018-03-04 DIAGNOSIS — M5416 Radiculopathy, lumbar region: Secondary | ICD-10-CM

## 2018-03-04 DIAGNOSIS — M159 Polyosteoarthritis, unspecified: Secondary | ICD-10-CM

## 2018-03-04 MED ORDER — ATORVASTATIN CALCIUM 20 MG PO TABS: 20.0000 mg | ORAL_TABLET | Freq: Every day | ORAL | 0 refills | Status: DC

## 2018-03-04 NOTE — Telephone Encounter (Signed)
Check [place] registry last filled 02/07/2018../lmb  

## 2018-03-04 NOTE — Telephone Encounter (Signed)
Requested Prescriptions  Pending Prescriptions Disp Refills  . atorvastatin (LIPITOR) 20 MG tablet 90 tablet 0    Sig: Take 1 tablet (20 mg total) by mouth daily.     Cardiovascular:  Antilipid - Statins Failed - 03/04/2018 10:09 AM      Failed - LDL in normal range and within 360 days    LDL Cholesterol  Date Value Ref Range Status  11/26/2016 67 0 - 99 mg/dL Final         Failed - HDL in normal range and within 360 days    HDL  Date Value Ref Range Status  10/17/2017 30.50 (L) >39.00 mg/dL Final         Failed - Triglycerides in normal range and within 360 days    Triglycerides  Date Value Ref Range Status  10/17/2017 243.0 (H) 0.0 - 149.0 mg/dL Final    Comment:    Normal:  <150 mg/dLBorderline High:  150 - 199 mg/dL         Passed - Total Cholesterol in normal range and within 360 days    Cholesterol  Date Value Ref Range Status  10/17/2017 147 0 - 200 mg/dL Final    Comment:    ATP III Classification       Desirable:  < 200 mg/dL               Borderline High:  200 - 239 mg/dL          High:  > = 240 mg/dL         Passed - Patient is not pregnant      Passed - Valid encounter within last 12 months    Recent Outpatient Visits          3 weeks ago Chronic right shoulder pain   [place], [place], DO   1 month ago Thrombocytosis [facility])   [place], Thomas L, MD   3 months ago Chronic right shoulder pain   [place], [place], DO   4 months ago Other iron deficiency anemias   [facility] -[facility] Camel, MD   6 months ago Vitamin B12 deficiency anemia due to intrinsic factor deficiency   [place], MD      Future Appointments            In 1 month Janith Lima, MD [facility]         . oxyCODONE-acetaminophen (PERCOCET) 10-325 MG tablet 90 tablet 0   Sig: Take 1 tablet by mouth every 8 (eight) hours as needed for pain.     Not Delegated - Analgesics:  Opioid Agonist Combinations Failed - 03/04/2018 10:09 AM      Failed - This refill cannot be delegated      Failed - Urine Drug Screen completed in last 360 days.      Passed - Valid encounter within last 6 months    Recent Outpatient Visits          3 weeks ago Chronic right shoulder pain   [place], [place], DO   1 month ago Thrombocytosis [facility])   [place], MD   3 months ago Chronic right shoulder pain   [place], DO   4 months  ago Other iron deficiency anemias   [facility] -[facility] Camel, MD   6 months ago Vitamin B12 deficiency anemia due to intrinsic factor deficiency   [place], MD      Future Appointments            In 1 month Ronnald Ramp Arvid Right, MD [place], [facility]

## 2018-03-04 NOTE — Telephone Encounter (Signed)
Copied from [place] [id number]. Topic: Quick Communication - See Telephone Encounter >> Mar 04, 2018  9:26 AM Conception Chancy, NT wrote: CRM for notification. See Telephone encounter for: 03/04/18.  Patient is requesting a refill on oxyCODONE-acetaminophen (PERCOCET) 10-325 MG tablet  and atorvastatin (LIPITOR) 20 MG tablet . Please advise.  [id number] [address], [place] [address] Phone: [phone] Fax: [phone]

## 2018-03-04 NOTE — Telephone Encounter (Signed)
    Requested medication (s) are due for refill today: yes oxycodone-acetaminophen 10-325    Requested medication (s) are on the active medication list: yes  Last refill:  02/07/18  Future visit scheduled: yes  Notes to clinic:  undelegated

## 2018-03-05 ENCOUNTER — Other Ambulatory Visit: Payer: Self-pay | Admitting: Internal Medicine

## 2018-03-05 DIAGNOSIS — I10 Essential (primary) hypertension: Secondary | ICD-10-CM

## 2018-03-05 DIAGNOSIS — M19211 Secondary osteoarthritis, right shoulder: Secondary | ICD-10-CM | POA: Diagnosis not present

## 2018-03-08 MED ORDER — OXYCODONE-ACETAMINOPHEN 10-325 MG PO TABS: 1.0000 | ORAL_TABLET | Freq: Three times a day (TID) | ORAL | 0 refills | Status: DC | PRN

## 2018-03-10 ENCOUNTER — Other Ambulatory Visit: Payer: Self-pay | Admitting: Orthopedic Surgery

## 2018-03-10 DIAGNOSIS — M47816 Spondylosis without myelopathy or radiculopathy, lumbar region: Secondary | ICD-10-CM | POA: Diagnosis not present

## 2018-03-10 DIAGNOSIS — M21372 Foot drop, left foot: Secondary | ICD-10-CM | POA: Diagnosis not present

## 2018-03-10 NOTE — Telephone Encounter (Signed)
MD approved and sent electronically to pof../lmb  

## 2018-03-13 ENCOUNTER — Other Ambulatory Visit: Payer: Self-pay

## 2018-03-13 ENCOUNTER — Encounter (HOSPITAL_COMMUNITY): Payer: Self-pay

## 2018-03-13 ENCOUNTER — Encounter (HOSPITAL_COMMUNITY)
Admission: RE | Admit: 2018-03-13 | Discharge: 2018-03-13 | Disposition: A | Discharge status: Home / Self Care | Payer: Medicare Other | Source: Ambulatory Visit | Attending: Orthopedic Surgery | Admitting: Orthopedic Surgery

## 2018-03-13 DIAGNOSIS — Z01812 Encounter for preprocedural laboratory examination: Secondary | ICD-10-CM | POA: Insufficient documentation

## 2018-03-13 LAB — CBC WITH DIFFERENTIAL/PLATELET
Abs Immature Granulocytes: 0.02 10*3/uL (ref 0.00–0.07)
Basophils Absolute: 0.1 10*3/uL (ref 0.0–0.1)
Basophils Relative: 1 %
Eosinophils Absolute: 0.2 10*3/uL (ref 0.0–0.5)
Eosinophils Relative: 2 %
HCT: 45.8 % (ref 39.0–52.0)
Hemoglobin: 15 g/dL (ref 13.0–17.0)
Immature Granulocytes: 0 %
Lymphocytes Relative: 22 %
Lymphs Abs: 2.2 10*3/uL (ref 0.7–4.0)
MCH: 29.9 pg (ref 26.0–34.0)
MCHC: 32.8 g/dL (ref 30.0–36.0)
MCV: 91.2 fL (ref 80.0–100.0)
Monocytes Absolute: 0.7 10*3/uL (ref 0.1–1.0)
Monocytes Relative: 7 %
Neutro Abs: 7.1 10*3/uL (ref 1.7–7.7)
Neutrophils Relative %: 68 %
Platelets: 318 10*3/uL (ref 150–400)
RBC: 5.02 MIL/uL (ref 4.22–5.81)
RDW: 14.6 % (ref 11.5–15.5)
WBC: 10.3 10*3/uL (ref 4.0–10.5)
nRBC: 0 % (ref 0.0–0.2)

## 2018-03-13 LAB — COMPREHENSIVE METABOLIC PANEL
ALT: 16 U/L (ref 0–44)
AST: 25 U/L (ref 15–41)
Albumin: 4 g/dL (ref 3.5–5.0)
Alkaline Phosphatase: 85 U/L (ref 38–126)
Anion gap: 13 (ref 5–15)
BUN: 9 mg/dL (ref 8–23)
CO2: 24 mmol/L (ref 22–32)
Calcium: 9.4 mg/dL (ref 8.9–10.3)
Chloride: 102 mmol/L (ref 98–111)
Creatinine, Ser: 1.05 mg/dL (ref 0.61–1.24)
GFR calc Af Amer: >60 mL/min (ref >60)
GFR calc non Af Amer: >60 mL/min (ref >60)
Glucose, Bld: 106 mg/dL — ABNORMAL HIGH (ref 70–99)
Potassium: 3.6 mmol/L (ref 3.5–5.1)
Sodium: 139 mmol/L (ref 135–145)
Total Bilirubin: 0.8 mg/dL (ref 0.3–1.2)
Total Protein: 7.8 g/dL (ref 6.5–8.1)

## 2018-03-13 LAB — PROTIME-INR
INR: 1.02
Prothrombin Time: 13.3 seconds (ref 11.4–15.2)

## 2018-03-13 LAB — TYPE AND SCREEN
ABO/RH(D): O POS
Antibody Screen: NEGATIVE

## 2018-03-13 LAB — APTT: aPTT: 34 seconds (ref 24–36)

## 2018-03-13 LAB — SURGICAL PCR SCREEN
MRSA, PCR: POSITIVE — AB
Staphylococcus aureus: POSITIVE — AB

## 2018-03-13 NOTE — Progress Notes (Signed)
Pt Nasal PCR positive for MRSA. Pt notified and I notified Myriam Jacobson with Dr. Bettina Gavia office.

## 2018-03-13 NOTE — Progress Notes (Addendum)
PCP - Scarlette Calico, Hulan Saas Cardiologist - denies current cardiologist  Chest x-ray - 10/01/17 EKG - 09/28/17 ECHO - 06/01/15  Anesthesia review: CXR  Patient denies shortness of breath, fever, cough and chest pain at PAT appointment   Patient verbalized understanding of instructions that were given to them at the PAT appointment. Patient was also instructed that they will need to review over the PAT instructions again at home before surgery.  Pt was unable to void at PAT appointment and stated that he would get sample and take to Dr. Bettina Gavia office which is near his home. I left message for Butch Penny at Dr. Bettina Gavia office about this.

## 2018-03-13 NOTE — Pre-Procedure Instructions (Signed)
Adrian Miller  03/13/2018      [id number] [place] [place] [address] [place] [address] Phone: [phone] Fax: [phone]    Your procedure is scheduled on Thursday November 14.  Report to [facility] Admitting at 10:20 A.M.  Call this number if you have problems the morning of surgery:  [phone]   Remember:  Do not eat or drink after midnight.    Take these medicines the morning of surgery with A SIP OF WATER:   Tylenol if needed OR percocet if needed  7 days prior to surgery STOP taking any Aspirin(unless otherwise instructed by your surgeon), Aleve, Naproxen, Ibuprofen, Motrin, Advil, Goody's, BC's, all herbal medications, fish oil, and all vitamins     Do not wear jewelry, make-up or nail polish.  Do not wear lotions, powders, or perfumes, or deodorant.  Do not shave 48 hours prior to surgery.  Men may shave face and neck.  Do not bring valuables to the hospital.  [facility] is not responsible for any belongings or valuables.  Contacts, dentures or bridgework may not be worn into surgery.  Leave your suitcase in the car.  After surgery it may be brought to your room.  For patients admitted to the hospital, discharge time will be determined by your treatment team.  Patients discharged the day of surgery will not be allowed to drive home.    Special instructions:    [facility]- Preparing For Surgery  Before surgery, you can play an important role. Because skin is not sterile, your skin needs to be as free of germs as possible. You can reduce the number of germs on your skin by washing with CHG (chlorahexidine gluconate) Soap before surgery.  CHG is an antiseptic cleaner which kills germs and bonds with the skin to continue killing germs even after washing.    Oral Hygiene is also important to reduce your risk of infection.  Remember - BRUSH YOUR TEETH THE MORNING OF SURGERY WITH YOUR REGULAR TOOTHPASTE  Please do  not use if you have an allergy to CHG or antibacterial soaps. If your skin becomes reddened/irritated stop using the CHG.  Do not shave (including legs and underarms) for at least 48 hours prior to first CHG shower. It is OK to shave your face.  Please follow these instructions carefully.   1. Shower the NIGHT BEFORE SURGERY and the MORNING OF SURGERY with CHG.   2. If you chose to wash your hair, wash your hair first as usual with your normal shampoo.  3. After you shampoo, rinse your hair and body thoroughly to remove the shampoo.  4. Use CHG as you would any other liquid soap. You can apply CHG directly to the skin and wash gently with a scrungie or a clean washcloth.   5. Apply the CHG Soap to your body ONLY FROM THE NECK DOWN.  Do not use on open wounds or open sores. Avoid contact with your eyes, ears, mouth and genitals (private parts). Wash Face and genitals (private parts)  with your normal soap.  6. Wash thoroughly, paying special attention to the area where your surgery will be performed.  7. Thoroughly rinse your body with warm water from the neck down.  8. DO NOT shower/wash with your normal soap after using and rinsing off the CHG Soap.  9. Pat yourself dry with a CLEAN TOWEL.  10. Wear CLEAN PAJAMAS to bed the night before  surgery, wear comfortable clothes the morning of surgery  11. Place CLEAN SHEETS on your bed the night of your first shower and DO NOT SLEEP WITH PETS.    Day of Surgery:  Do not apply any deodorants/lotions.  Please wear clean clothes to the hospital/surgery center.   Remember to brush your teeth WITH YOUR REGULAR TOOTHPASTE.    Please read over the following fact sheets that you were given. Coughing and Deep Breathing, MRSA Information and Surgical Site Infection Prevention

## 2018-03-14 ENCOUNTER — Other Ambulatory Visit: Payer: Self-pay | Admitting: Orthopedic Surgery

## 2018-03-14 DIAGNOSIS — Z01812 Encounter for preprocedural laboratory examination: Secondary | ICD-10-CM | POA: Diagnosis not present

## 2018-03-14 DIAGNOSIS — M5416 Radiculopathy, lumbar region: Secondary | ICD-10-CM | POA: Diagnosis not present

## 2018-03-14 LAB — URINALYSIS, ROUTINE W REFLEX MICROSCOPIC
Bilirubin Urine: NEGATIVE
Glucose, UA: NEGATIVE mg/dL
Hgb urine dipstick: NEGATIVE
Ketones, ur: NEGATIVE mg/dL
Leukocytes, UA: NEGATIVE
Nitrite: NEGATIVE
Protein, ur: NEGATIVE mg/dL
Specific Gravity, Urine: 1.016 (ref 1.005–1.030)
pH: 5 (ref 5.0–8.0)

## 2018-03-17 DIAGNOSIS — F17210 Nicotine dependence, cigarettes, uncomplicated: Secondary | ICD-10-CM | POA: Diagnosis not present

## 2018-03-17 DIAGNOSIS — M5416 Radiculopathy, lumbar region: Secondary | ICD-10-CM | POA: Diagnosis not present

## 2018-03-17 DIAGNOSIS — Z716 Tobacco abuse counseling: Secondary | ICD-10-CM | POA: Diagnosis not present

## 2018-03-19 ENCOUNTER — Other Ambulatory Visit: Payer: Self-pay | Admitting: Orthopedic Surgery

## 2018-03-19 MED ORDER — TRANEXAMIC ACID-NACL 1000-0.7 MG/100ML-% IV SOLN
1000.0000 mg | INTRAVENOUS | Status: DC
  Filled 2018-03-19: qty 100

## 2018-03-20 ENCOUNTER — Encounter (HOSPITAL_COMMUNITY)
Admission: RE | Disposition: A | Discharge status: Home / Self Care | Payer: Self-pay | Source: Home / Self Care | Attending: Orthopedic Surgery

## 2018-03-20 ENCOUNTER — Inpatient Hospital Stay (HOSPITAL_COMMUNITY)
Admission: RE | Admit: 2018-03-20 | Discharge: 2018-03-21 | DRG: 483 | Disposition: A | Discharge status: Home / Self Care | Payer: Medicare Other | Attending: Orthopedic Surgery | Admitting: Orthopedic Surgery

## 2018-03-20 ENCOUNTER — Inpatient Hospital Stay (HOSPITAL_COMMUNITY): Payer: Medicare Other | Admitting: Physician Assistant

## 2018-03-20 ENCOUNTER — Encounter (HOSPITAL_COMMUNITY): Payer: Self-pay | Admitting: *Deleted

## 2018-03-20 ENCOUNTER — Other Ambulatory Visit: Payer: Self-pay

## 2018-03-20 ENCOUNTER — Inpatient Hospital Stay (HOSPITAL_COMMUNITY): Payer: Medicare Other

## 2018-03-20 DIAGNOSIS — I10 Essential (primary) hypertension: Secondary | ICD-10-CM | POA: Diagnosis present

## 2018-03-20 DIAGNOSIS — Z886 Allergy status to analgesic agent status: Secondary | ICD-10-CM

## 2018-03-20 DIAGNOSIS — Z8711 Personal history of peptic ulcer disease: Secondary | ICD-10-CM | POA: Diagnosis not present

## 2018-03-20 DIAGNOSIS — Z9049 Acquired absence of other specified parts of digestive tract: Secondary | ICD-10-CM | POA: Diagnosis not present

## 2018-03-20 DIAGNOSIS — I739 Peripheral vascular disease, unspecified: Secondary | ICD-10-CM | POA: Diagnosis not present

## 2018-03-20 DIAGNOSIS — M19011 Primary osteoarthritis, right shoulder: Principal | ICD-10-CM | POA: Diagnosis present

## 2018-03-20 DIAGNOSIS — M25711 Osteophyte, right shoulder: Secondary | ICD-10-CM | POA: Diagnosis not present

## 2018-03-20 DIAGNOSIS — Z96653 Presence of artificial knee joint, bilateral: Secondary | ICD-10-CM | POA: Diagnosis not present

## 2018-03-20 DIAGNOSIS — Z471 Aftercare following joint replacement surgery: Secondary | ICD-10-CM | POA: Diagnosis not present

## 2018-03-20 DIAGNOSIS — Z96611 Presence of right artificial shoulder joint: Secondary | ICD-10-CM

## 2018-03-20 DIAGNOSIS — E785 Hyperlipidemia, unspecified: Secondary | ICD-10-CM | POA: Diagnosis not present

## 2018-03-20 DIAGNOSIS — Z8249 Family history of ischemic heart disease and other diseases of the circulatory system: Secondary | ICD-10-CM | POA: Diagnosis not present

## 2018-03-20 DIAGNOSIS — Z79891 Long term (current) use of opiate analgesic: Secondary | ICD-10-CM | POA: Diagnosis not present

## 2018-03-20 DIAGNOSIS — F17210 Nicotine dependence, cigarettes, uncomplicated: Secondary | ICD-10-CM | POA: Diagnosis not present

## 2018-03-20 DIAGNOSIS — G8918 Other acute postprocedural pain: Secondary | ICD-10-CM | POA: Diagnosis not present

## 2018-03-20 DIAGNOSIS — K222 Esophageal obstruction: Secondary | ICD-10-CM | POA: Diagnosis not present

## 2018-03-20 DIAGNOSIS — K219 Gastro-esophageal reflux disease without esophagitis: Secondary | ICD-10-CM | POA: Diagnosis not present

## 2018-03-20 DIAGNOSIS — Z79899 Other long term (current) drug therapy: Secondary | ICD-10-CM

## 2018-03-20 DIAGNOSIS — M75101 Unspecified rotator cuff tear or rupture of right shoulder, not specified as traumatic: Secondary | ICD-10-CM | POA: Diagnosis present

## 2018-03-20 HISTORY — PX: REVERSE SHOULDER ARTHROPLASTY: SHX5054

## 2018-03-20 LAB — GLUCOSE, CAPILLARY: Glucose-Capillary: 101 mg/dL — ABNORMAL HIGH (ref 70–99)

## 2018-03-20 SURGERY — ARTHROPLASTY, SHOULDER, TOTAL, REVERSE
Anesthesia: Regional | Laterality: Right

## 2018-03-20 MED ORDER — LIDOCAINE 2% (20 MG/ML) 5 ML SYRINGE: INTRAMUSCULAR | Status: DC | PRN

## 2018-03-20 MED ORDER — ATORVASTATIN CALCIUM 20 MG PO TABS
20.0000 mg | ORAL_TABLET | Freq: Every day | ORAL | Status: DC
  Administered 2018-03-21: 20 mg via ORAL
  Filled 2018-03-20: qty 1

## 2018-03-20 MED ORDER — METHOCARBAMOL 1000 MG/10ML IJ SOLN
500.0000 mg | Freq: Four times a day (QID) | INTRAVENOUS | Status: DC | PRN
  Filled 2018-03-20: qty 5

## 2018-03-20 MED ORDER — ROCURONIUM BROMIDE 50 MG/5ML IV SOSY
PREFILLED_SYRINGE | INTRAVENOUS | Status: AC
  Filled 2018-03-20: qty 5

## 2018-03-20 MED ORDER — POVIDONE-IODINE 7.5 % EX SOLN
Freq: Once | CUTANEOUS | Status: DC
  Filled 2018-03-20: qty 118

## 2018-03-20 MED ORDER — FENTANYL CITRATE (PF) 100 MCG/2ML IJ SOLN
INTRAMUSCULAR | Status: AC
  Administered 2018-03-20: 100 ug via INTRAVENOUS
  Filled 2018-03-20: qty 2

## 2018-03-20 MED ORDER — FENTANYL CITRATE (PF) 100 MCG/2ML IJ SOLN: 25.0000 ug | INTRAMUSCULAR | Status: DC | PRN

## 2018-03-20 MED ORDER — LIDOCAINE 2% (20 MG/ML) 5 ML SYRINGE
INTRAMUSCULAR | Status: AC
  Filled 2018-03-20: qty 5

## 2018-03-20 MED ORDER — MIDAZOLAM HCL 2 MG/2ML IJ SOLN
INTRAMUSCULAR | Status: AC
  Filled 2018-03-20: qty 2

## 2018-03-20 MED ORDER — DEXAMETHASONE SODIUM PHOSPHATE 10 MG/ML IJ SOLN
INTRAMUSCULAR | Status: AC
  Filled 2018-03-20: qty 1

## 2018-03-20 MED ORDER — PHENYLEPHRINE HCL 10 MG/ML IJ SOLN
INTRAMUSCULAR | Status: DC | PRN
  Administered 2018-03-20 (×2): 80 ug via INTRAVENOUS

## 2018-03-20 MED ORDER — TRANEXAMIC ACID-NACL 1000-0.7 MG/100ML-% IV SOLN: 1000.0000 mg | INTRAVENOUS | Status: DC

## 2018-03-20 MED ORDER — SUGAMMADEX SODIUM 200 MG/2ML IV SOLN
INTRAVENOUS | Status: DC | PRN
  Administered 2018-03-20: 200 mg via INTRAVENOUS

## 2018-03-20 MED ORDER — CEFAZOLIN SODIUM-DEXTROSE 2-4 GM/100ML-% IV SOLN
2.0000 g | Freq: Once | INTRAVENOUS | Status: AC
  Administered 2018-03-20: 2 g via INTRAVENOUS

## 2018-03-20 MED ORDER — BISACODYL 5 MG PO TBEC: 5.0000 mg | DELAYED_RELEASE_TABLET | Freq: Every day | ORAL | Status: DC | PRN

## 2018-03-20 MED ORDER — GLYCOPYRROLATE PF 0.2 MG/ML IJ SOSY
PREFILLED_SYRINGE | INTRAMUSCULAR | Status: DC | PRN
  Administered 2018-03-20 (×2): .1 mg via INTRAVENOUS

## 2018-03-20 MED ORDER — ONDANSETRON HCL 4 MG PO TABS: 4.0000 mg | ORAL_TABLET | Freq: Four times a day (QID) | ORAL | Status: DC | PRN

## 2018-03-20 MED ORDER — EPHEDRINE 5 MG/ML INJ
INTRAVENOUS | Status: AC
  Filled 2018-03-20: qty 10

## 2018-03-20 MED ORDER — TRANEXAMIC ACID-NACL 1000-0.7 MG/100ML-% IV SOLN
INTRAVENOUS | Status: DC | PRN
  Administered 2018-03-20: 1000 mg via INTRAVENOUS

## 2018-03-20 MED ORDER — ROCURONIUM BROMIDE 100 MG/10ML IV SOLN
INTRAVENOUS | Status: DC | PRN
  Administered 2018-03-20: 40 mg via INTRAVENOUS

## 2018-03-20 MED ORDER — SODIUM CHLORIDE 0.9 % IR SOLN
Status: DC | PRN
  Administered 2018-03-20: 3000 mL

## 2018-03-20 MED ORDER — ONDANSETRON HCL 4 MG/2ML IJ SOLN
INTRAMUSCULAR | Status: DC | PRN
  Administered 2018-03-20: 4 mg via INTRAVENOUS

## 2018-03-20 MED ORDER — ACETAMINOPHEN 325 MG PO TABS: 325.0000 mg | ORAL_TABLET | Freq: Four times a day (QID) | ORAL | Status: DC | PRN

## 2018-03-20 MED ORDER — DEXAMETHASONE SODIUM PHOSPHATE 10 MG/ML IJ SOLN
INTRAMUSCULAR | Status: DC | PRN
  Administered 2018-03-20: 10 mg via INTRAVENOUS

## 2018-03-20 MED ORDER — PROPOFOL 10 MG/ML IV BOLUS
INTRAVENOUS | Status: AC
  Filled 2018-03-20: qty 20

## 2018-03-20 MED ORDER — ONDANSETRON HCL 4 MG/2ML IJ SOLN: 4.0000 mg | Freq: Four times a day (QID) | INTRAMUSCULAR | Status: DC | PRN

## 2018-03-20 MED ORDER — PHENYLEPHRINE 40 MCG/ML (10ML) SYRINGE FOR IV PUSH (FOR BLOOD PRESSURE SUPPORT)
PREFILLED_SYRINGE | INTRAVENOUS | Status: AC
  Filled 2018-03-20: qty 10

## 2018-03-20 MED ORDER — BUPIVACAINE LIPOSOME 1.3 % IJ SUSP
INTRAMUSCULAR | Status: DC | PRN
  Administered 2018-03-20: 10 mL via PERINEURAL

## 2018-03-20 MED ORDER — PHENOL 1.4 % MT LIQD
1.0000 | OROMUCOSAL | Status: DC | PRN
  Administered 2018-03-21: 1 via OROMUCOSAL

## 2018-03-20 MED ORDER — OXYCODONE HCL 5 MG PO TABS
5.0000 mg | ORAL_TABLET | ORAL | Status: DC | PRN
  Administered 2018-03-21 (×2): 10 mg via ORAL
  Filled 2018-03-20 (×2): qty 2

## 2018-03-20 MED ORDER — DIPHENHYDRAMINE HCL 12.5 MG/5ML PO ELIX: 12.5000 mg | ORAL_SOLUTION | ORAL | Status: DC | PRN

## 2018-03-20 MED ORDER — MENTHOL 3 MG MT LOZG
1.0000 | LOZENGE | OROMUCOSAL | Status: DC | PRN
  Filled 2018-03-20: qty 9

## 2018-03-20 MED ORDER — CEFAZOLIN SODIUM-DEXTROSE 2-4 GM/100ML-% IV SOLN
2.0000 g | Freq: Four times a day (QID) | INTRAVENOUS | Status: AC
  Administered 2018-03-20 – 2018-03-21 (×3): 2 g via INTRAVENOUS
  Filled 2018-03-20 (×3): qty 100

## 2018-03-20 MED ORDER — POLYETHYLENE GLYCOL 3350 17 G PO PACK: 17.0000 g | PACK | Freq: Every day | ORAL | Status: DC | PRN

## 2018-03-20 MED ORDER — LACTATED RINGERS IV SOLN
INTRAVENOUS | Status: DC
  Administered 2018-03-20 (×2): via INTRAVENOUS

## 2018-03-20 MED ORDER — VANCOMYCIN HCL IN DEXTROSE 1-5 GM/200ML-% IV SOLN
INTRAVENOUS | Status: AC
  Administered 2018-03-20: 1000 mg via INTRAVENOUS
  Filled 2018-03-20: qty 200

## 2018-03-20 MED ORDER — SODIUM CHLORIDE 0.9 % IV SOLN
INTRAVENOUS | Status: DC
  Administered 2018-03-20: 19:00:00 via INTRAVENOUS

## 2018-03-20 MED ORDER — FENTANYL CITRATE (PF) 250 MCG/5ML IJ SOLN
INTRAMUSCULAR | Status: AC
  Filled 2018-03-20: qty 5

## 2018-03-20 MED ORDER — EPHEDRINE SULFATE 50 MG/ML IJ SOLN
INTRAMUSCULAR | Status: DC | PRN
  Administered 2018-03-20 (×2): 5 mg via INTRAVENOUS
  Administered 2018-03-20: 10 mg via INTRAVENOUS
  Administered 2018-03-20: 5 mg via INTRAVENOUS

## 2018-03-20 MED ORDER — SODIUM CHLORIDE 0.9 % IV SOLN
INTRAVENOUS | Status: DC | PRN
  Administered 2018-03-20: 30 ug/min via INTRAVENOUS

## 2018-03-20 MED ORDER — VANCOMYCIN HCL IN DEXTROSE 1-5 GM/200ML-% IV SOLN: 1000.0000 mg | Freq: Once | INTRAVENOUS | Status: DC

## 2018-03-20 MED ORDER — CEFAZOLIN SODIUM-DEXTROSE 2-4 GM/100ML-% IV SOLN
INTRAVENOUS | Status: AC
  Filled 2018-03-20: qty 100

## 2018-03-20 MED ORDER — FLEET ENEMA 7-19 GM/118ML RE ENEM: 1.0000 | ENEMA | Freq: Once | RECTAL | Status: DC | PRN

## 2018-03-20 MED ORDER — METHOCARBAMOL 500 MG PO TABS: 500.0000 mg | ORAL_TABLET | Freq: Four times a day (QID) | ORAL | Status: DC | PRN

## 2018-03-20 MED ORDER — ONDANSETRON HCL 4 MG/2ML IJ SOLN
INTRAMUSCULAR | Status: AC
  Filled 2018-03-20: qty 2

## 2018-03-20 MED ORDER — OXYCODONE HCL 5 MG PO TABS: 10.0000 mg | ORAL_TABLET | ORAL | Status: DC | PRN

## 2018-03-20 MED ORDER — ALUM & MAG HYDROXIDE-SIMETH 200-200-20 MG/5ML PO SUSP: 30.0000 mL | ORAL | Status: DC | PRN

## 2018-03-20 MED ORDER — DOCUSATE SODIUM 100 MG PO CAPS
100.0000 mg | ORAL_CAPSULE | Freq: Two times a day (BID) | ORAL | Status: DC
  Administered 2018-03-20 – 2018-03-21 (×2): 100 mg via ORAL
  Filled 2018-03-20 (×2): qty 1

## 2018-03-20 MED ORDER — VANCOMYCIN HCL IN DEXTROSE 1-5 GM/200ML-% IV SOLN
1000.0000 mg | INTRAVENOUS | Status: AC
  Administered 2018-03-20: 1000 mg via INTRAVENOUS

## 2018-03-20 MED ORDER — 0.9 % SODIUM CHLORIDE (POUR BTL) OPTIME
TOPICAL | Status: DC | PRN
  Administered 2018-03-20: 1000 mL

## 2018-03-20 MED ORDER — GLYCOPYRROLATE PF 0.2 MG/ML IJ SOSY
PREFILLED_SYRINGE | INTRAMUSCULAR | Status: AC
  Filled 2018-03-20: qty 1

## 2018-03-20 MED ORDER — ONDANSETRON HCL 4 MG/2ML IJ SOLN: 4.0000 mg | Freq: Once | INTRAMUSCULAR | Status: DC | PRN

## 2018-03-20 MED ORDER — HYDROMORPHONE HCL 1 MG/ML IJ SOLN: 0.5000 mg | INTRAMUSCULAR | Status: DC | PRN

## 2018-03-20 MED ORDER — FENTANYL CITRATE (PF) 250 MCG/5ML IJ SOLN
INTRAMUSCULAR | Status: DC | PRN
  Administered 2018-03-20: 50 ug via INTRAVENOUS
  Administered 2018-03-20: 100 ug via INTRAVENOUS

## 2018-03-20 MED ORDER — LIDOCAINE 2% (20 MG/ML) 5 ML SYRINGE
INTRAMUSCULAR | Status: DC | PRN
  Administered 2018-03-20: 100 mg via INTRAVENOUS

## 2018-03-20 MED ORDER — BUPIVACAINE HCL (PF) 0.5 % IJ SOLN
INTRAMUSCULAR | Status: DC | PRN
  Administered 2018-03-20: 15 mL via PERINEURAL

## 2018-03-20 MED ORDER — ZOLPIDEM TARTRATE 5 MG PO TABS: 5.0000 mg | ORAL_TABLET | Freq: Every evening | ORAL | Status: DC | PRN

## 2018-03-20 MED ORDER — CEFAZOLIN SODIUM-DEXTROSE 2-4 GM/100ML-% IV SOLN: 2.0000 g | INTRAVENOUS | Status: DC

## 2018-03-20 MED ORDER — PROPOFOL 10 MG/ML IV BOLUS
INTRAVENOUS | Status: DC | PRN
  Administered 2018-03-20: 130 mg via INTRAVENOUS

## 2018-03-20 MED ORDER — METOCLOPRAMIDE HCL 5 MG/ML IJ SOLN: 5.0000 mg | Freq: Three times a day (TID) | INTRAMUSCULAR | Status: DC | PRN

## 2018-03-20 MED ORDER — FENTANYL CITRATE (PF) 100 MCG/2ML IJ SOLN
100.0000 ug | Freq: Once | INTRAMUSCULAR | Status: AC
  Administered 2018-03-20: 100 ug via INTRAVENOUS

## 2018-03-20 MED ORDER — METOCLOPRAMIDE HCL 5 MG PO TABS: 5.0000 mg | ORAL_TABLET | Freq: Three times a day (TID) | ORAL | Status: DC | PRN

## 2018-03-20 MED ORDER — ALBUMIN HUMAN 5 % IV SOLN
INTRAVENOUS | Status: DC | PRN
  Administered 2018-03-20: 14:00:00 via INTRAVENOUS

## 2018-03-20 SURGICAL SUPPLY — 79 items
BASEPLATE P2 COATD GLND 6.5X30 (Shoulder) ×1 IMPLANT
BIT DRILL 2.5 DIA 127 CALI (BIT) ×2 IMPLANT
BIT DRILL 4 DIA CALIBRATED (BIT) ×2 IMPLANT
BIT DRILL 5/64X5 DISP (BIT) ×2 IMPLANT
BLADE SAW SAG 73X25 THK (BLADE) ×1
BLADE SAW SGTL 73X25 THK (BLADE) ×1 IMPLANT
BLADE SURG 15 STRL LF DISP TIS (BLADE) ×1 IMPLANT
BLADE SURG 15 STRL SS (BLADE) ×1
CHLORAPREP W/TINT 26ML (MISCELLANEOUS) ×2 IMPLANT
CLSR STERI-STRIP ANTIMIC 1/2X4 (GAUZE/BANDAGES/DRESSINGS) ×2 IMPLANT
COVER SURGICAL LIGHT HANDLE (MISCELLANEOUS) ×2 IMPLANT
COVER WAND RF STERILE (DRAPES) ×2 IMPLANT
DRAPE INCISE IOBAN 66X45 STRL (DRAPES) ×2 IMPLANT
DRAPE ORTHO SPLIT 77X108 STRL (DRAPES) ×2
DRAPE SURG ORHT 6 SPLT 77X108 (DRAPES) ×2 IMPLANT
DRESSING AQUACEL AG SP 3.5X6 (GAUZE/BANDAGES/DRESSINGS) ×1 IMPLANT
DRSG AQUACEL AG SP 3.5X6 (GAUZE/BANDAGES/DRESSINGS) ×2
ELECT BLADE 4.0 EZ CLEAN MEGAD (MISCELLANEOUS) ×4
ELECT REM PT RETURN 9FT ADLT (ELECTROSURGICAL) ×2
ELECTRODE BLDE 4.0 EZ CLN MEGD (MISCELLANEOUS) ×2 IMPLANT
ELECTRODE REM PT RTRN 9FT ADLT (ELECTROSURGICAL) ×1 IMPLANT
FACESHIELD LNG OPTICON STERILE (SAFETY) ×2 IMPLANT
GLOVE BIO SURGEON STRL SZ7 (GLOVE) ×2 IMPLANT
GLOVE BIO SURGEON STRL SZ7.5 (GLOVE) ×2 IMPLANT
GLOVE BIOGEL PI IND STRL 6.5 (GLOVE) ×1 IMPLANT
GLOVE BIOGEL PI IND STRL 7.0 (GLOVE) ×1 IMPLANT
GLOVE BIOGEL PI IND STRL 8 (GLOVE) ×1 IMPLANT
GLOVE BIOGEL PI INDICATOR 6.5 (GLOVE) ×1
GLOVE BIOGEL PI INDICATOR 7.0 (GLOVE) ×1
GLOVE BIOGEL PI INDICATOR 8 (GLOVE) ×1
GLOVE SURG SS PI 6.5 STRL IVOR (GLOVE) ×4 IMPLANT
GOWN STRL REUS W/ TWL LRG LVL3 (GOWN DISPOSABLE) ×3 IMPLANT
GOWN STRL REUS W/ TWL XL LVL3 (GOWN DISPOSABLE) ×1 IMPLANT
GOWN STRL REUS W/TWL LRG LVL3 (GOWN DISPOSABLE) ×3
GOWN STRL REUS W/TWL XL LVL3 (GOWN DISPOSABLE) ×1
HANDPIECE INTERPULSE COAX TIP (DISPOSABLE) ×1
HOOD PEEL AWAY FLYTE STAYCOOL (MISCELLANEOUS) ×4 IMPLANT
INSERT EPOLY STND HUMERUS+4 32 (Shoulder) ×2 IMPLANT
INSERT EPOLYSTD HUMERUS+4 32 (Shoulder) ×1 IMPLANT
KIT BASIN OR (CUSTOM PROCEDURE TRAY) ×2 IMPLANT
KIT TURNOVER KIT B (KITS) ×2 IMPLANT
MANIFOLD NEPTUNE II (INSTRUMENTS) ×2 IMPLANT
NEEDLE MAYO TROCAR (NEEDLE) IMPLANT
NS IRRIG 1000ML POUR BTL (IV SOLUTION) ×2 IMPLANT
P2 COATDE GLNOID BSEPLT 6.5X30 (Shoulder) ×2 IMPLANT
PACK SHOULDER (CUSTOM PROCEDURE TRAY) ×2 IMPLANT
PAD ARMBOARD 7.5X6 YLW CONV (MISCELLANEOUS) ×2 IMPLANT
RESTRAINT HEAD UNIVERSAL NS (MISCELLANEOUS) ×2 IMPLANT
RETRIEVER SUT HEWSON (MISCELLANEOUS) IMPLANT
SCREW BONE LOCKING RSP 5.0X30 (Screw) ×2 IMPLANT
SCREW BONE RSP LOCK 5X18 (Screw) ×2 IMPLANT
SCREW BONE RSP LOCK 5X26 (Screw) ×1 IMPLANT
SCREW BONE RSP LOCK 5X30 (Screw) ×1 IMPLANT
SCREW BONE RSP LOCKING 18MM LG (Screw) ×4 IMPLANT
SCREW BONE RSP LOCKING 5.0X26 (Screw) ×2 IMPLANT
SCREW RETAIN W/HEAD 32MM (Shoulder) ×2 IMPLANT
SET HNDPC FAN SPRY TIP SCT (DISPOSABLE) ×1 IMPLANT
SLING ARM FOAM STRAP LRG (SOFTGOODS) ×2 IMPLANT
SLING ARM FOAM STRAP MED (SOFTGOODS) IMPLANT
SLING ARM IMMOBILIZER LRG (SOFTGOODS) ×2 IMPLANT
SPONGE LAP 18X18 X RAY DECT (DISPOSABLE) IMPLANT
SPONGE LAP 4X18 RFD (DISPOSABLE) IMPLANT
STEM REVERSE ALTIVATE 8MM STD (Shoulder) ×2 IMPLANT
STRIP CLOSURE SKIN 1/2X4 (GAUZE/BANDAGES/DRESSINGS) ×2 IMPLANT
SUCTION FRAZIER HANDLE 10FR (MISCELLANEOUS) ×1
SUCTION TUBE FRAZIER 10FR DISP (MISCELLANEOUS) ×1 IMPLANT
SUPPORT WRAP ARM LG (MISCELLANEOUS) ×2 IMPLANT
SUT ETHIBOND NAB CT1 #1 30IN (SUTURE) ×2 IMPLANT
SUT FIBERWIRE #2 38 T-5 BLUE (SUTURE)
SUT MNCRL AB 4-0 PS2 18 (SUTURE) ×2 IMPLANT
SUT SILK 2 0 TIES 17X18 (SUTURE)
SUT SILK 2-0 18XBRD TIE BLK (SUTURE) IMPLANT
SUT VIC AB 2-0 CT1 27 (SUTURE) ×1
SUT VIC AB 2-0 CT1 TAPERPNT 27 (SUTURE) ×1 IMPLANT
SUTURE FIBERWR #2 38 T-5 BLUE (SUTURE) IMPLANT
SYR 30ML LL (SYRINGE) IMPLANT
TOWEL OR 17X26 10 PK STRL BLUE (TOWEL DISPOSABLE) ×2 IMPLANT
WATER STERILE IRR 1000ML POUR (IV SOLUTION) ×2 IMPLANT
YANKAUER SUCT BULB TIP NO VENT (SUCTIONS) IMPLANT

## 2018-03-20 NOTE — Anesthesia Preprocedure Evaluation (Addendum)
Anesthesia Evaluation  Patient identified by MRN, date of birth, ID band Patient awake    Reviewed: Allergy & Precautions, NPO status , Patient's Chart, lab work & pertinent test results  Airway Mallampati: III  TM Distance: >3 FB Neck ROM: Full    Dental  (+) Poor Dentition, Missing,    Pulmonary Current Smoker,    Pulmonary exam normal breath sounds clear to auscultation       Cardiovascular hypertension, Pt. on medications + Peripheral Vascular Disease  Normal cardiovascular exam Rhythm:Regular Rate:Normal  ECG: SR, rate 59   Neuro/Psych Left foot drop negative psych ROS   GI/Hepatic Neg liver ROS, hiatal hernia, PUD, GERD  Medicated and Controlled,Esophageal stricture   Endo/Other  negative endocrine ROS  Renal/GU negative Renal ROS     Musculoskeletal negative musculoskeletal ROS (+)   Abdominal   Peds  Hematology HLD   Anesthesia Other Findings RIGHT SHOULDER ROTATOR CUFF TEAR ARTHROPATHY  Reproductive/Obstetrics                            Anesthesia Physical Anesthesia Plan  ASA: III  Anesthesia Plan: General and Regional   Post-op Pain Management: GA combined w/ Regional for post-op pain   Induction: Intravenous  PONV Risk Score and Plan: 1 and Ondansetron, Dexamethasone and Treatment may vary due to age or medical condition  Airway Management Planned: Oral ETT  Additional Equipment:   Intra-op Plan:   Post-operative Plan: Extubation in OR  Informed Consent: I have reviewed the patients History and Physical, chart, labs and discussed the procedure including the risks, benefits and alternatives for the proposed anesthesia with the patient or authorized representative who has indicated his/her understanding and acceptance.   Dental advisory given  Plan Discussed with: CRNA  Anesthesia Plan Comments:        Anesthesia Quick Evaluation

## 2018-03-20 NOTE — Transfer of Care (Signed)
Immediate Anesthesia Transfer of Care Note  Patient: Adrian Miller  Procedure(s) Performed: RIGHT REVERSE SHOULDER ARTHROPLASTY (Right )  Patient Location: PACU  Anesthesia Type:GA combined with regional for post-op pain  Level of Consciousness: awake, alert  and oriented  Airway & Oxygen Therapy: Patient Spontanous Breathing and Patient connected to nasal cannula oxygen  Post-op Assessment: Report given to RN and Post -op Vital signs reviewed and stable  Post vital signs: Reviewed and stable  Last Vitals:  Vitals Value Taken Time  BP 117/82 03/20/2018  3:01 PM  Temp 36.5 C 03/20/2018  3:01 PM  Pulse 86 03/20/2018  3:04 PM  Resp 22 03/20/2018  3:04 PM  SpO2 95 % 03/20/2018  3:04 PM  Vitals shown include unvalidated device data.  Last Pain:  Vitals:   03/20/18 1501  TempSrc:   PainSc: 0-No pain         Complications: No apparent anesthesia complications

## 2018-03-20 NOTE — Op Note (Signed)
Procedure(s): RIGHT REVERSE SHOULDER ARTHROPLASTY Procedure Note  VIVIAN OKELLEY male 77 y.o. 03/20/2018  Procedure(s) and Anesthesia Type:    * RIGHT REVERSE SHOULDER ARTHROPLASTY - Choice   Indications:  77 y.o. male  With endstage right shoulder arthritis with irrepairable rotator cuff tear. Pain and dysfunction interfered with quality of life and nonoperative treatment with activity modification, NSAIDS and injections failed.     Surgeon: Isabella Stalling   Assistants: Jeanmarie Hubert PA-C [facility] was present and scrubbed throughout the procedure and was essential in positioning, retraction, exposure, and closure)  Anesthesia: General endotracheal anesthesia with preoperative interscalene block given by the attending anesthesiologist     Procedure Detail  RIGHT REVERSE SHOULDER ARTHROPLASTY   Estimated Blood Loss:  200 mL         Drains: none  Blood Given: none          Specimens: none        Complications:  * No complications entered in OR log *         Disposition: PACU - hemodynamically stable.         Condition: stable      OPERATIVE FINDINGS:  A DJO Altivate pressfit reverse total shoulder arthroplasty was placed with a  size 8 stem, a 32 standard glenosphere, and a +4-mm poly insert. The base plate  fixation was excellent.  PROCEDURE: The patient was identified in the preoperative holding area  where I personally marked the operative site after verifying site, side,  and procedure with the patient. An interscalene block given by  the attending anesthesiologist in the holding area and the patient was taken back to the operating room where all extremities were  carefully padded in position after general anesthesia was induced. She  was placed in a beach-chair position and the operative upper extremity was  prepped and draped in a standard sterile fashion. The patient received 1 g IV tranexamic acid at the start of the case around time of the  incision. An approximately 10-  cm incision was made from the tip of the coracoid process to the center  point of the humerus at the level of the axilla. Dissection was carried  down through subcutaneous tissues to the level of the cephalic vein  which was taken laterally with the deltoid. The pectoralis major was  retracted medially. The subdeltoid space was developed and the lateral  edge of the conjoined tendon was identified. The undersurface of  conjoined tendon was palpated and the musculocutaneous nerve was not in  the field. Retractor was placed underneath the conjoined and second  retractor was placed lateral into the deltoid. The circumflex humeral  artery and vessels were identified and clamped and coagulated. The  biceps tendon was absent.  The subscapularis was taken down as a peel with the underlying capsule.  The  joint was then gently externally rotated while the capsule was released  from the humeral neck around to just beyond the 6 o'clock position. At  this point, the joint was dislocated and the humeral head was presented  into the wound. The excessive osteophyte formation was removed with a  large rongeur.  The cutting guide was used to make the appropriate  head cut and the head was saved for potentially bone grafting.  The glenoid was exposed with the arm in an  abducted extended position. The anterior and posterior labrum were  completely excised and the capsule was released circumferentially to  allow for exposure of the glenoid  for preparation. The 2.5 mm drill was  placed using the guide in 5-10 inferior angulation and the tap was then advanced in the same hole. Small and large reamers were then used. The tap was then removed and the Metaglene was then screwed in with excellent purchase.  The peripheral guide was then used to drilled measured and filled peripheral locking screws. The size 32 standard glenosphere was then impacted on the [facility] taper and the central  screw was placed. The humerus was then again exposed and the diaphyseal reamers were used followed by the metaphyseal reamers. The final broach was left in place in the proximal trial was placed. The joint was reduced and with this implant it was felt that soft tissue tensioning was appropriate with excellent stability and excellent range of motion. Therefore, final humeral stem was placed press-fit with bone grafting.  And then the trial polyethylene inserts were tested again and the above implant was felt to be the most appropriate for final insertion. The joint was reduced taken through full range of motion and felt to be stable. Soft tissue tension was appropriate.  The joint was then copiously irrigated with pulse  lavage and the wound was then closed. The subscapularis was not repaired.  Skin was closed with 2-0 Vicryl in a deep dermal layer and 4-0  Monocryl for skin closure. Steri-Strips were applied. Sterile  dressings were then applied as well as a sling. The patient was allowed  to awaken from general anesthesia, transferred to stretcher, and taken  to recovery room in stable condition.   POSTOPERATIVE PLAN: The patient will be kept in the hospital postoperatively  for pain control and therapy.

## 2018-03-20 NOTE — Anesthesia Postprocedure Evaluation (Signed)
Anesthesia Post Note  Patient: RAYMUND MANRIQUE  Procedure(s) Performed: RIGHT REVERSE SHOULDER ARTHROPLASTY (Right )     Patient location during evaluation: PACU Anesthesia Type: Regional and General Level of consciousness: awake and alert Pain management: pain level controlled Vital Signs Assessment: post-procedure vital signs reviewed and stable Respiratory status: spontaneous breathing, nonlabored ventilation, respiratory function stable and patient connected to nasal cannula oxygen Cardiovascular status: blood pressure returned to baseline and stable Postop Assessment: no apparent nausea or vomiting Anesthetic complications: no    Last Vitals:  Vitals:   03/20/18 1603 03/20/18 1636  BP: 126/74 122/74  Pulse: 71 75  Resp: 16 18  Temp: 36.5 C 36.9 C  SpO2: 96% 97%    Last Pain:  Vitals:   03/20/18 2008  TempSrc:   PainSc: 0-No pain                 Rockwell Zentz P Aivah Putman

## 2018-03-20 NOTE — Discharge Instructions (Signed)

## 2018-03-20 NOTE — H&P (Signed)
Adrian Miller is an 77 y.o. male.   Chief Complaint: R shoulder pain and dysfunction HPI: Endstage right shoulder rotator cuff tear arthopathy with significant pain and dysfunction, failed conservative measures.  Pain interferes with sleep and quality of life.   Past Medical History:  Diagnosis Date  . Acute esophagitis 06/01/2015  . Benign essential HTN 05/31/2015  . Duodenal ulcer hemorrhage   . Esophageal stricture 06/01/2015  . Hiatal hernia 06/01/2015    Past Surgical History:  Procedure Laterality Date  . CHOLECYSTECTOMY N/A 09/30/2017   Procedure: LAPAROSCOPIC CHOLECYSTECTOMY WITH INTRAOPERATIVE CHOLANGIOGRAM;  Surgeon: Excell Seltzer, MD;  Location: [facility];  Service: General;  Laterality: N/A;  . ESOPHAGOGASTRODUODENOSCOPY N/A 05/31/2015   Procedure: ESOPHAGOGASTRODUODENOSCOPY (EGD);  Surgeon: Ladene Artist, MD;  Location: [facility];  Service: Endoscopy;  Laterality: N/A;  . HERNIA REPAIR  1975  . LEFT ROTATOR CUFF REPAIR  2002  . REPLACEMENT TOTAL KNEE Left   . TOTAL KNEE ARTHROPLASTY Right 02/22/2015   Procedure: TOTAL KNEE ARTHROPLASTY;  Surgeon: Melrose Nakayama, MD;  Location: [facility];  Service: Orthopedics;  Laterality: Right;    Family History  Problem Relation Age of Onset  . Hypertension Mother   . Heart attack Sister    Social History:  reports that he has been smoking cigarettes. He has a 25.50 pack-year smoking history. He quit smokeless tobacco use about 3 years ago. He reports that he does not drink alcohol or use drugs.  Allergies:  Allergies  Allergen Reactions  . Asa [Aspirin] Other (See Comments)    Angioedema    Medications Prior to Admission  Medication Sig Dispense Refill  . acetaminophen (TYLENOL) 325 MG tablet Take 650 mg by mouth daily as needed for moderate pain or headache.    [name] atorvastatin (LIPITOR) 20 MG tablet Take 1 tablet (20 mg total) by mouth daily. 90 tablet 0  . ferrous sulfate 325 (65 FE) MG tablet Take 1 tablet (325 mg total)  by mouth 3 (three) times daily with meals. (Patient taking differently: Take 325 mg by mouth 2 (two) times a week. ) 90 tablet 5  . oxyCODONE-acetaminophen (PERCOCET) 10-325 MG tablet Take 1 tablet by mouth every 8 (eight) hours as needed for pain. (Patient taking differently: Take 1 tablet by mouth every 6 (six) hours as needed for pain. ) 90 tablet 0  . chlorthalidone (HYGROTON) 25 MG tablet TAKE 1 TABLET BY MOUTH EVERY DAY (Patient not taking: Reported on 03/12/2018) 90 tablet 0  . olmesartan (BENICAR) 40 MG tablet Take 1 tablet (40 mg total) by mouth daily. (Patient not taking: Reported on 03/12/2018) 90 tablet 1  . pantoprazole (PROTONIX) 40 MG tablet Take 1 tablet (40 mg total) by mouth daily. (Patient not taking: Reported on 03/12/2018) 90 tablet 1    No results found for this or any previous visit (from the past 48 hour(s)). No results found.  Review of Systems  All other systems reviewed and are negative.   Blood pressure (!) 156/85, pulse 88, temperature 98.6 F (37 C), temperature source Oral, resp. rate 20, SpO2 99 %. Physical Exam  Constitutional: He is oriented to person, place, and time. He appears well-developed and well-nourished.  HENT:  Head: Atraumatic.  Eyes: EOM are normal.  Cardiovascular: Intact distal pulses.  Respiratory: Effort normal.  Musculoskeletal:  R shoulder pain with limited ROM. NVID.  Neurological: He is alert and oriented to person, place, and time.  Skin: Skin is warm and dry.  Psychiatric: He has a  normal mood and affect.     Assessment/Plan R shoulder endstage rotator cuff tear arthropathy Plan R reverse TSA Risks / benefits of surgery discussed Consent on chart  NPO for OR Preop antibiotics  Isabella Stalling, MD 03/20/2018, 9:52 AM

## 2018-03-20 NOTE — Progress Notes (Signed)
Patient arrived to PACU with quarter sized hives to left lateral abdomen, left groin, and top of left thigh. Pt states they do not currently itch. OR nurse and CRNA at bedside. OR nurse states the bumps were not noticed until they moved him from the OR table to the bed. Dr Roanna Banning notified and given no new orders at this time.

## 2018-03-20 NOTE — Anesthesia Procedure Notes (Signed)
Anesthesia Regional Block: Interscalene brachial plexus block   Pre-Anesthetic Checklist: ,, timeout performed, Correct Patient, Correct Site, Correct Laterality, Correct Procedure, Correct Position, site marked, Risks and benefits discussed,  Surgical consent,  Pre-op evaluation,  At surgeon's request and post-op pain management  Laterality: Right  Prep: chloraprep       Needles:  Injection technique: Single-shot  Needle Type: Echogenic Stimulator Needle     Needle Length: 9cm  Needle Gauge: 21     Additional Needles:   Procedures:,,,, ultrasound used (permanent image in chart),,,,  Narrative:  Start time: 03/20/2018 12:00 PM End time: 03/20/2018 12:10 PM Injection made incrementally with aspirations every 5 mL.  Performed by: Personally  Anesthesiologist: Murvin Natal, MD  Additional Notes: Functioning IV was confirmed and monitors were applied.  A timeout was performed. Sterile prep, hand hygiene and sterile gloves were used. A 21mm 21ga Arrow echogenic stimulator needle was used. Negative aspiration and negative test dose prior to incremental administration of local anesthetic. The patient tolerated the procedure well.  Ultrasound guidance: relevent anatomy identified, needle position confirmed, local anesthetic spread visualized around nerve(s), vascular puncture avoided.  Image printed for medical record.

## 2018-03-20 NOTE — Anesthesia Procedure Notes (Signed)
Procedure Name: Intubation Date/Time: 03/20/2018 1:22 PM Performed by: Renato Shin, CRNA Pre-anesthesia Checklist: Patient identified, Emergency Drugs available, Suction available and Patient being monitored Patient Re-evaluated:Patient Re-evaluated prior to induction Oxygen Delivery Method: Circle system utilized Preoxygenation: Pre-oxygenation with 100% oxygen Induction Type: IV induction Ventilation: Mask ventilation without difficulty Laryngoscope Size: Glidescope and 4 Grade View: Grade I Tube type: Oral Tube size: 7.5 mm Number of attempts: 1 Airway Equipment and Method: Patient positioned with wedge pillow and Rigid stylet Placement Confirmation: ETT inserted through vocal cords under direct vision,  CO2 detector,  positive ETCO2 and breath sounds checked- equal and bilateral Secured at: 23 cm Tube secured with: Tape Dental Injury: Teeth and Oropharynx as per pre-operative assessment  Comments: Blase Mess, SRNA

## 2018-03-20 NOTE — Plan of Care (Signed)
  Problem: Activity: Goal: Risk for activity intolerance will decrease Outcome: Progressing   Problem: Coping: Goal: Level of anxiety will decrease Outcome: Progressing   Problem: Elimination: Goal: Will not experience complications related to bowel motility Outcome: Progressing   Problem: Pain Managment: Goal: General experience of comfort will improve Outcome: Progressing   Problem: Safety: Goal: Ability to remain free from injury will improve Outcome: Progressing   

## 2018-03-21 ENCOUNTER — Encounter (HOSPITAL_COMMUNITY): Payer: Self-pay | Admitting: Orthopedic Surgery

## 2018-03-21 LAB — CBC
HCT: 40.6 % (ref 39.0–52.0)
Hemoglobin: 13.5 g/dL (ref 13.0–17.0)
MCH: 29.5 pg (ref 26.0–34.0)
MCHC: 33.3 g/dL (ref 30.0–36.0)
MCV: 88.8 fL (ref 80.0–100.0)
Platelets: 326 10*3/uL (ref 150–400)
RBC: 4.57 MIL/uL (ref 4.22–5.81)
RDW: 14.6 % (ref 11.5–15.5)
WBC: 14.9 10*3/uL — ABNORMAL HIGH (ref 4.0–10.5)
nRBC: 0 % (ref 0.0–0.2)

## 2018-03-21 LAB — BASIC METABOLIC PANEL
Anion gap: 10 (ref 5–15)
BUN: 14 mg/dL (ref 8–23)
CO2: 24 mmol/L (ref 22–32)
Calcium: 8.9 mg/dL (ref 8.9–10.3)
Chloride: 102 mmol/L (ref 98–111)
Creatinine, Ser: 1.18 mg/dL (ref 0.61–1.24)
GFR calc Af Amer: >60 mL/min (ref >60)
GFR calc non Af Amer: 58 mL/min — ABNORMAL LOW (ref >60)
Glucose, Bld: 144 mg/dL — ABNORMAL HIGH (ref 70–99)
Potassium: 3.3 mmol/L — ABNORMAL LOW (ref 3.5–5.1)
Sodium: 136 mmol/L (ref 135–145)

## 2018-03-21 MED ORDER — OXYCODONE-ACETAMINOPHEN 5-325 MG PO TABS: 1.0000 | ORAL_TABLET | ORAL | 0 refills | Status: DC | PRN

## 2018-03-21 NOTE — Progress Notes (Signed)
   PATIENT ID: Adrian Miller   1 Day Post-Op Procedure(s) (LRB): RIGHT REVERSE SHOULDER ARTHROPLASTY (Right)  Subjective: Doing great this am. Already dressed to go home. Denies pain, still numb in the hand and right arm. No other complaints or concerns.   Objective:  Vitals:   03/20/18 2122 03/21/18 0516  BP: 120/80 130/70  Pulse: 75 83  Resp: 18 16  Temp: 98.2 F (36.8 C) 99.6 F (37.6 C)  SpO2: 98% 96%     R UE dressing c/d/i Wiggles fingers  Labs:  Recent Labs    03/21/18 0246  HGB 13.5   Recent Labs    03/21/18 0246  WBC 14.9*  RBC 4.57  HCT 40.6  PLT 326   Recent Labs    03/21/18 0246  NA 136  K 3.3*  CL 102  CO2 24  BUN 14  CREATININE 1.18  GLUCOSE 144*  CALCIUM 8.9    Assessment and Plan: 1 day s/p right reverse TSA OT- hand write elbow rom only D/c home today when cleared by OT Fu with Dr. Tamera Punt in 2 weeks  Scripts in chart  VTE proph: scds, asa allergy

## 2018-03-21 NOTE — Progress Notes (Signed)
Orthopedic Tech Progress Note Patient Details:  Adrian Miller 11-07-1940 [id number]  Ortho Devices Type of Ortho Device: Arm sling Ortho Device/Splint Location: rue. replacement for to small sling. Ortho Device/Splint Interventions: Ordered, Application, Adjustment   Post Interventions Patient Tolerated: Well Instructions Provided: Care of device, Adjustment of device   Karolee Stamps 03/21/2018, 12:19 PM

## 2018-03-21 NOTE — Discharge Summary (Signed)
Patient ID: Adrian Miller MRN: [id number] DOB/AGE: [birth year] 77 y.o.  Admit date: 03/20/2018 Discharge date: 03/21/2018  Admission Diagnoses:  Active Problems:   S/P reverse total shoulder arthroplasty, right   Discharge Diagnoses:  Same  Past Medical History:  Diagnosis Date  . Acute esophagitis 06/01/2015  . Benign essential HTN 05/31/2015  . Duodenal ulcer hemorrhage   . Esophageal stricture 06/01/2015  . Hiatal hernia 06/01/2015    Surgeries: Procedure(s): RIGHT REVERSE SHOULDER ARTHROPLASTY on 03/20/2018   Consultants:   Discharged Condition: Improved  Hospital Course: LEOR WHYTE is an 77 y.o. male who was admitted 03/20/2018 for operative treatment of right reverse total shoulder arthroplasty. Patient has severe unremitting pain that affects sleep, daily activities, and work/hobbies. After pre-op clearance the patient was taken to the operating room on 03/20/2018 and underwent  Procedure(s): RIGHT REVERSE SHOULDER ARTHROPLASTY.    Patient was given perioperative antibiotics:  Anti-infectives (From admission, onward)   Start     Dose/Rate Route Frequency Ordered Stop   03/20/18 2200  ceFAZolin (ANCEF) IVPB 2g/100 mL premix     2 g 200 mL/hr over 30 Minutes Intravenous Every 6 hours 03/20/18 1635 03/21/18 1559   03/20/18 1000  vancomycin (VANCOCIN) IVPB 1000 mg/200 mL premix     1,000 mg 200 mL/hr over 60 Minutes Intravenous To Short Stay 03/20/18 0651 03/20/18 1327   03/20/18 0700  ceFAZolin (ANCEF) IVPB 2g/100 mL premix     2 g 200 mL/hr over 30 Minutes Intravenous  Once 03/20/18 0651 03/20/18 1355   03/20/18 0700  vancomycin (VANCOCIN) IVPB 1000 mg/200 mL premix  Status:  Discontinued     1,000 mg 200 mL/hr over 60 Minutes Intravenous  Once 03/20/18 0651 03/20/18 0713   03/20/18 0700  ceFAZolin (ANCEF) IVPB 2g/100 mL premix  Status:  Discontinued     2 g 200 mL/hr over 30 Minutes Intravenous On call to O.R. 03/20/18 6213 03/20/18 1626       Patient was  given sequential compression devices, early ambulation to prevent DVT.  Patient benefited maximally from hospital stay and there were no complications.    Recent vital signs:  Patient Vitals for the past 24 hrs:  BP Temp Temp src Pulse Resp SpO2  03/21/18 0516 130/70 99.6 F (37.6 C) Oral 83 16 96 %  03/20/18 2122 120/80 98.2 F (36.8 C) Oral 75 18 98 %  03/20/18 1636 122/74 98.4 F (36.9 C) Oral 75 18 97 %  03/20/18 1603 126/74 97.7 F (36.5 C) - 71 16 96 %  03/20/18 1558 124/76 - - 77 14 96 %  03/20/18 1543 (!) 125/94 - - 76 16 96 %  03/20/18 1528 117/70 - - 78 14 94 %  03/20/18 1513 107/66 - - 83 17 92 %  03/20/18 1501 117/82 97.7 F (36.5 C) - 87 16 96 %  03/20/18 1459 - - - 89 13 97 %  03/20/18 1220 (!) 157/76 - - 63 14 100 %  03/20/18 1215 (!) 143/80 - - 68 18 100 %  03/20/18 1210 139/83 - - 62 18 99 %  03/20/18 1205 (!) 133/111 - - 74 15 100 %     Recent laboratory studies:  Recent Labs    03/21/18 0246  WBC 14.9*  HGB 13.5  HCT 40.6  PLT 326  NA 136  K 3.3*  CL 102  CO2 24  BUN 14  CREATININE 1.18  GLUCOSE 144*  CALCIUM 8.9     Discharge Medications:  Allergies as of 03/21/2018      Reactions   Asa [aspirin] Other (See Comments)   Angioedema      Medication List    STOP taking these medications   acetaminophen 325 MG tablet Commonly known as:  TYLENOL   oxyCODONE-acetaminophen 10-325 MG tablet Commonly known as:  PERCOCET Replaced by:  oxyCODONE-acetaminophen 5-325 MG tablet     TAKE these medications   atorvastatin 20 MG tablet Commonly known as:  LIPITOR Take 1 tablet (20 mg total) by mouth daily.   chlorthalidone 25 MG tablet Commonly known as:  HYGROTON TAKE 1 TABLET BY MOUTH EVERY DAY   ferrous sulfate 325 (65 FE) MG tablet Take 1 tablet (325 mg total) by mouth 3 (three) times daily with meals. What changed:  when to take this   olmesartan 40 MG tablet Commonly known as:  BENICAR Take 1 tablet (40 mg total) by mouth  daily.   oxyCODONE-acetaminophen 5-325 MG tablet Commonly known as:  PERCOCET/ROXICET Take 1-2 tablets by mouth every 4 (four) hours as needed for severe pain. Replaces:  oxyCODONE-acetaminophen 10-325 MG tablet   pantoprazole 40 MG tablet Commonly known as:  PROTONIX Take 1 tablet (40 mg total) by mouth daily.       Diagnostic Studies: Dg Shoulder Right Port  Result Date: 03/20/2018 CLINICAL DATA:  Postop right shoulder. EXAM: PORTABLE RIGHT SHOULDER COMPARISON:  Chest x-ray 10/01/2017. FINDINGS: Total right shoulder replacement. Hardware intact. Anatomic alignment. No acute bony abnormality. Clavicular degenerative change IMPRESSION: Total right shoulder replacement with anatomic alignment. Electronically Signed   By: Marcello Moores  Register   On: 03/20/2018 15:22    Disposition:     Follow-up Information    Tania Ade, MD. Schedule an appointment as soon as possible for a visit in 2 weeks.   Specialty:  Orthopedic Surgery Contact information: [address] [place] [address] [phone]            Signed: Grier Mitts 03/21/2018, 8:53 AM

## 2018-03-21 NOTE — Plan of Care (Signed)
  Problem: Pain Managment: Goal: General experience of comfort will improve Outcome: Progressing   Problem: Safety: Goal: Ability to remain free from injury will improve Outcome: Progressing   

## 2018-03-21 NOTE — Progress Notes (Signed)
Occupational Therapy Evaluation Patient Details Name: Adrian Miller MRN: [id number] DOB: [birth year] Today's Date: 03/21/2018  Clinical Impression: PTA Pt mod I independent in ADL with DME for bathinbg and mobility with SPC PRN. Pt required MAX cues for safety this session - to maintain WB precautions and for no shoulder movement throughout session. Shoulder precautions - conservative protocol reviewed (handout provided): Educated patient on don doff sling with return demonstration,washing under arms with wash cloth, avoid shoulder movement, positioning with pillows in chair for support and to loosen sling strap for decreased strain on neck, sleeping positioning (recommend recliner if possible), pt educated on dressing sequence and limiting shoulder movement during dressing tasks.  Home exercise program as stated below (indicated by MD). Education complete. Pt was able to verbally confirm understanding, with block in place he required MAX cues for NWB and no movement. Ortho tech called at end of session for larger sling as Pt was in medium and it only went to wrist, not to knuckles. Please continue therapy at the discretion of MD at follow up.     03/21/18 0800  OT Visit Information  Last OT Received On 03/21/18  Assistance Needed +1  History of Present Illness 77 y/o male s/p RIGHT REVERSE SHOULDER ARTHROPLASTY. Pt has a PMH including LEFT ROTATOR CUFF REPAIR (2002); Replacement total knee (Left); Total knee arthroplasty (Right, 02/22/2015); Esophagogastroduodenoscopy (05/31/2015).  Precautions  Precautions Shoulder  Type of Shoulder Precautions conservative protocol  Shoulder Interventions Shoulder sling/immobilizer;At all times;Off for dressing/bathing/exercises  Precaution Booklet Issued Yes (comment)  Precaution Comments shoulder dc handout reviewed in full  Required Braces or Orthoses Sling  Restrictions  Weight Bearing Restrictions Yes  RUE Weight Bearing NWB  Home Living  Family/patient  expects to be discharged to: Private residence  Living Arrangements Alone  Available Help at Discharge Family;Available PRN/intermittently  Type of Home House  Home Access Level entry  Home Layout One level  Bathroom Shower/Tub Tub/shower unit  Corporate treasurer Yes  How Accessible Accessible via walker  [place] - 2 wheels;BSC;Cane - single point;Tub bench  Prior Function  Level of Independence Independent with assistive device(s)  Comments uses SPC prn; AFO for L foot  Communication  Communication No difficulties  Pain Assessment  Pain Assessment 0-10  Pain Score 1  Pain Location R shoulder  Pain Descriptors / Indicators Dull  Pain Intervention(s) Monitored during session;Other (comment) (block still largely in place)  Cognition  Arousal/Alertness Awake/alert  Behavior During Therapy Impulsive  Overall Cognitive Status Impaired/Different from baseline  Area of Impairment Safety/judgement  Safety/Judgement Decreased awareness of safety;Decreased awareness of deficits  General Comments Pt required max cues for NWB through RUE and no movement at shoulder  Upper Extremity Assessment  Upper Extremity Assessment RUE deficits/detail  RUE Deficits / Details post-op deficits as anticipated; block still largely in place  RUE Sensation decreased light touch (block still in place)  RUE Coordination decreased gross motor  Lower Extremity Assessment  Lower Extremity Assessment LLE deficits/detail  LLE Deficits / Details wears an AFO for drop foot at baseline  ADL  General ADL Comments see shoulder section below  Vision- History  Patient Visual Report No change from baseline  Bed Mobility  Overal bed mobility Modified Independent  General bed mobility comments increased time, cues for NWB through RUE  Transfers  Overall transfer level Modified independent  Equipment used None  General transfer comment Pt requires cues for NWB when  scooting forward to edge of seat  for transfers  Balance  Overall balance assessment Mild deficits observed, not formally tested  Exercises  Exercises Shoulder  Shoulder Instructions  Donning/doffing shirt without moving shoulder Set-up (educated on sequence of RUE first)  Method for sponge bathing under operated UE Supervision/safety  Donning/doffing sling/immobilizer Supervision/safety  Correct positioning of sling/immobilizer Supervision/safety  ROM for elbow, wrist and digits of operated UE Supervision/safety (educated, block still largely in place)  Sling wearing schedule (on at all times/off for ADL's) Supervision/safety  Proper positioning of operated UE when showering Supervision/safety  Positioning of UE while sleeping Supervision/safety  Shoulder Exercises  Elbow Flexion AROM;Right;10 reps;Seated;Standing  Elbow Extension AROM;Right;Seated;Standing  Wrist Flexion AROM;Right  Wrist Extension AROM;Right  Digit Composite Flexion AROM;Right  Composite Extension AROM;Right  Neck Flexion AROM  Neck Extension AROM  Neck Lateral Flexion - Right AROM  Neck Lateral Flexion - Left AROM  OT - End of Session  Equipment Utilized During Treatment Other (comment) (sling)  Activity Tolerance Patient tolerated treatment well  Patient left in chair;with call bell/phone within reach  Nurse Communication Mobility status;Weight bearing status;Precautions  OT Assessment  OT Recommendation/Assessment Progress rehab of shoulder as ordered by MD at follow-up appointment  OT Visit Diagnosis Pain  Pain - Right/Left Right  Pain - part of body Shoulder  OT Problem List Decreased safety awareness;Decreased knowledge of precautions;Impaired sensation;Impaired UE functional use;Pain  AM-PAC OT "6 Clicks" Daily Activity Outcome Measure  Help from another person eating meals? 3  Help from another person taking care of personal grooming? 3  Help from another person toileting, which includes using  toliet, bedpan, or urinal? 4  Help from another person bathing (including washing, rinsing, drying)? 4  Help from another person to put on and taking off regular upper body clothing? 3  Help from another person to put on and taking off regular lower body clothing? 3  6 Click Score 20  ADL G Code Conversion CJ  OT Recommendation  Follow Up Recommendations Follow surgeon's recommendation for DC plan and follow-up therapies  OT Equipment None recommended by OT (Pt has appropriate DME)  Acute Rehab OT Goals  Patient Stated Goal to get back home today  OT Goal Formulation With patient  Time For Goal Achievement 04/04/18  Potential to Achieve Goals Good  OT Time Calculation  OT Start Time (ACUTE ONLY) 1114  OT Stop Time (ACUTE ONLY) 1150  OT Time Calculation (min) 36 min  OT General Charges  $OT Visit 1 Visit  OT Evaluation  $OT Eval Moderate Complexity 1 Mod  OT Treatments  $Self Care/Home Management  8-22 mins  Written Expression  Dominant Hand Right   Hulda Humphrey OTR/L Acute Rehabilitation Services Pager: [phone] Office: [phone]

## 2018-04-07 ENCOUNTER — Other Ambulatory Visit: Payer: Self-pay | Admitting: Internal Medicine

## 2018-04-07 NOTE — Telephone Encounter (Signed)
Copied from [place] [phone]. Topic: Quick Communication - Rx Refill/Question >> Apr 07, 2018  3:34 PM Ahmed Prima L wrote: Medication: oxyCODONE-acetaminophen (PERCOCET) 5-325 MG tablet  Has the patient contacted their pharmacy? Yes they told him to call office for written script (Agent: If no, request that the patient contact the pharmacy for the refill.) (Agent: If yes, when and what did the pharmacy advise?)  Preferred Pharmacy (with phone number or street name): [id number] [place] [place] [address]    Agent: Please be advised that RX refills may take up to 3 business days. We ask that you follow-up with your pharmacy.

## 2018-04-08 MED ORDER — OXYCODONE-ACETAMINOPHEN 5-325 MG PO TABS: 1.0000 | ORAL_TABLET | Freq: Three times a day (TID) | ORAL | 0 refills | Status: DC | PRN

## 2018-04-08 NOTE — Telephone Encounter (Signed)
#  [phone] filled on 03/21/2018

## 2018-04-09 ENCOUNTER — Ambulatory Visit: Payer: Medicare Other | Admitting: Internal Medicine

## 2018-04-09 DIAGNOSIS — M5416 Radiculopathy, lumbar region: Secondary | ICD-10-CM | POA: Diagnosis not present

## 2018-04-22 ENCOUNTER — Ambulatory Visit: Payer: Medicare Other | Admitting: Internal Medicine

## 2018-04-22 ENCOUNTER — Encounter: Payer: Self-pay | Admitting: Internal Medicine

## 2018-04-22 ENCOUNTER — Other Ambulatory Visit (INDEPENDENT_AMBULATORY_CARE_PROVIDER_SITE_OTHER): Payer: Medicare Other

## 2018-04-22 VITALS — BP 122/80 | HR 67 | Temp 98.7°F | Ht 69.0 in | Wt 182.0 lb

## 2018-04-22 DIAGNOSIS — I10 Essential (primary) hypertension: Secondary | ICD-10-CM

## 2018-04-22 DIAGNOSIS — E876 Hypokalemia: Secondary | ICD-10-CM

## 2018-04-22 DIAGNOSIS — D508 Other iron deficiency anemias: Secondary | ICD-10-CM

## 2018-04-22 DIAGNOSIS — D510 Vitamin B12 deficiency anemia due to intrinsic factor deficiency: Secondary | ICD-10-CM

## 2018-04-22 DIAGNOSIS — T502X5A Adverse effect of carbonic-anhydrase inhibitors, benzothiadiazides and other diuretics, initial encounter: Secondary | ICD-10-CM

## 2018-04-22 DIAGNOSIS — Z79891 Long term (current) use of opiate analgesic: Secondary | ICD-10-CM

## 2018-04-22 DIAGNOSIS — R7303 Prediabetes: Secondary | ICD-10-CM

## 2018-04-22 DIAGNOSIS — M5416 Radiculopathy, lumbar region: Secondary | ICD-10-CM

## 2018-04-22 DIAGNOSIS — M159 Polyosteoarthritis, unspecified: Secondary | ICD-10-CM

## 2018-04-22 LAB — CBC WITH DIFFERENTIAL/PLATELET
Basophils Absolute: 0.1 10*3/uL (ref 0.0–0.1)
Basophils Relative: 0.7 % (ref 0.0–3.0)
Eosinophils Absolute: 0.1 10*3/uL (ref 0.0–0.7)
Eosinophils Relative: 1 % (ref 0.0–5.0)
HCT: 43.2 % (ref 39.0–52.0)
Hemoglobin: 14.7 g/dL (ref 13.0–17.0)
Lymphocytes Relative: 22.7 % (ref 12.0–46.0)
Lymphs Abs: 2.5 10*3/uL (ref 0.7–4.0)
MCHC: 34.1 g/dL (ref 30.0–36.0)
MCV: 92.9 fl (ref 78.0–100.0)
Monocytes Absolute: 0.6 10*3/uL (ref 0.1–1.0)
Monocytes Relative: 5.9 % (ref 3.0–12.0)
Neutro Abs: 7.6 10*3/uL (ref 1.4–7.7)
Neutrophils Relative %: 69.7 % (ref 43.0–77.0)
Platelets: 373 10*3/uL (ref 150.0–400.0)
RBC: 4.65 Mil/uL (ref 4.22–5.81)
RDW: 16.2 % — ABNORMAL HIGH (ref 11.5–15.5)
WBC: 10.9 10*3/uL — ABNORMAL HIGH (ref 4.0–10.5)

## 2018-04-22 LAB — BASIC METABOLIC PANEL
BUN: 15 mg/dL (ref 6–23)
CO2: 30 mEq/L (ref 19–32)
Calcium: 10.1 mg/dL (ref 8.4–10.5)
Chloride: 98 mEq/L (ref 96–112)
Creatinine, Ser: 1.03 mg/dL (ref 0.40–1.50)
GFR: 90.07 mL/min (ref >60.00)
Glucose, Bld: 104 mg/dL — ABNORMAL HIGH (ref 70–99)
Potassium: 3.8 mEq/L (ref 3.5–5.1)
Sodium: 135 mEq/L (ref 135–145)

## 2018-04-22 LAB — MAGNESIUM: Magnesium: 2 mg/dL (ref 1.5–2.5)

## 2018-04-22 LAB — HEMOGLOBIN A1C: Hgb A1c MFr Bld: 5.8 % (ref 4.6–6.5)

## 2018-04-22 MED ORDER — OXYCODONE-ACETAMINOPHEN 10-325 MG PO TABS: 1.0000 | ORAL_TABLET | Freq: Four times a day (QID) | ORAL | 0 refills | Status: DC | PRN

## 2018-04-22 NOTE — Progress Notes (Signed)
Subjective:  Patient ID: Adrian Miller, male    DOB: [birth year]  Age: 77 y.o. MRN: [id number]  CC: Hypertension and Diabetes   HPI Adrian Miller presents for f/up - Adrian Miller complains of musculoskeletal pain.  I recently sent in a prescription for Percocet 5 mg but Adrian Miller says that it's not adequately controlling the pain and Adrian Miller wants to go back to the Percocet 10 mg, taking 3-4 a day to control his chronic pain.  Adrian Miller tells me his blood pressure has been well controlled and Adrian Miller denies any recent episodes of DOE, CP, palpitations, edema, or fatigue.  Adrian Miller says Adrian Miller is not a candidate for any more surgeries.  Outpatient Medications Prior to Visit  Medication Sig Dispense Refill  . atorvastatin (LIPITOR) 20 MG tablet Take 1 tablet (20 mg total) by mouth daily. 90 tablet 0  . chlorthalidone (HYGROTON) 25 MG tablet TAKE 1 TABLET BY MOUTH EVERY DAY 90 tablet 0  . ferrous sulfate 325 (65 FE) MG tablet Take 1 tablet (325 mg total) by mouth 3 (three) times daily with meals. (Patient taking differently: Take 325 mg by mouth 2 (two) times a week. ) 90 tablet 5  . olmesartan (BENICAR) 40 MG tablet Take 1 tablet (40 mg total) by mouth daily. 90 tablet 1  . pantoprazole (PROTONIX) 40 MG tablet Take 1 tablet (40 mg total) by mouth daily. 90 tablet 1  . oxyCODONE-acetaminophen (PERCOCET) 5-325 MG tablet Take 1 tablet by mouth every 8 (eight) hours as needed for severe pain. 75 tablet 0   No facility-administered medications prior to visit.     ROS Review of Systems  Constitutional: Negative for diaphoresis, fatigue and unexpected weight change.  HENT: Negative.   Eyes: Negative for visual disturbance.  Respiratory: Negative for cough, chest tightness, shortness of breath and wheezing.   Cardiovascular: Negative for chest pain, palpitations and leg swelling.  Gastrointestinal: Negative for abdominal pain, constipation, diarrhea, nausea and vomiting.  Genitourinary: Negative.  Negative for difficulty urinating and  dysuria.  Musculoskeletal: Positive for arthralgias and back pain. Negative for joint swelling and myalgias.  Skin: Negative.  Negative for color change and pallor.  Neurological: Negative.  Negative for dizziness, weakness, light-headedness and headaches.  Hematological: Negative for adenopathy. Does not bruise/bleed easily.  Psychiatric/Behavioral: Negative.     Objective:  BP 122/80 (BP Location: Left Arm, Patient Position: Sitting, Cuff Size: Normal)   Pulse 67   Temp 98.7 F (37.1 C) (Oral)   Ht 5\' 9"  (1.753 m)   Wt 182 lb (82.6 kg)   SpO2 98%   BMI 26.88 kg/m   BP Readings from Last 3 Encounters:  04/22/18 122/80  03/21/18 133/72  03/13/18 135/70    Wt Readings from Last 3 Encounters:  04/22/18 182 lb (82.6 kg)  03/13/18 186 lb 11.2 oz (84.7 kg)  02/05/18 184 lb (83.5 kg)    Physical Exam Vitals signs reviewed.  Constitutional:      General: Adrian Miller is not in acute distress.    Appearance: Adrian Miller is not ill-appearing.  HENT:     Nose: Nose normal.     Mouth/Throat:     Mouth: Mucous membranes are moist.     Pharynx: No oropharyngeal exudate or posterior oropharyngeal erythema.  Eyes:     Conjunctiva/sclera: Conjunctivae normal.  Neck:     Musculoskeletal: Normal range of motion and neck supple.  Cardiovascular:     Rate and Rhythm: Normal rate and regular rhythm.     Heart  sounds: No murmur. No gallop.   Pulmonary:     Effort: Pulmonary effort is normal.     Breath sounds: Normal breath sounds. No wheezing, rhonchi or rales.  Abdominal:     General: Abdomen is flat. Bowel sounds are normal.     Palpations: Abdomen is soft. There is no hepatomegaly, splenomegaly or mass.     Tenderness: There is no abdominal tenderness.  Musculoskeletal: Normal range of motion.        General: No swelling, tenderness or deformity.     Right lower leg: No edema.  Skin:    General: Skin is warm and dry.  Neurological:     General: No focal deficit present.     Mental Status: Adrian Miller  is alert and oriented to person, place, and time.  Psychiatric:        Mood and Affect: Mood normal.        Behavior: Behavior normal.        Thought Content: Thought content normal.        Judgment: Judgment normal.     Lab Results  Component Value Date   WBC 10.9 (H) 04/22/2018   HGB 14.7 04/22/2018   HCT 43.2 04/22/2018   PLT 373.0 04/22/2018   GLUCOSE 104 (H) 04/22/2018   CHOL 147 10/17/2017   TRIG 243.0 (H) 10/17/2017   HDL 30.50 (L) 10/17/2017   LDLDIRECT 40.0 10/17/2017   LDLCALC 67 11/26/2016   ALT 16 03/13/2018   AST 25 03/13/2018   NA 135 04/22/2018   K 3.8 04/22/2018   CL 98 04/22/2018   CREATININE 1.03 04/22/2018   BUN 15 04/22/2018   CO2 30 04/22/2018   TSH 1.42 11/26/2016   PSA 0.87 03/18/2014   INR 1.02 03/13/2018   HGBA1C 5.8 04/22/2018    No results found.  Assessment & Plan:   [name] was seen today for hypertension and diabetes.  Diagnoses and all orders for this visit:  Benign essential HTN- His blood pressure is well controlled.  Electrolytes and renal function are normal. -     Basic metabolic panel; Future -     Magnesium; Future  Encounter for long-term use of opiate analgesic- Will monitor him for compliance and will screen for substance abuse. -     Pain Mgmt, Profile 8 w/Conf, U; Future  Prediabetes- His blood sugar is adequately well controlled. -     Hemoglobin A1c; Future  Other iron deficiency anemias- His H&H are normal now. -     CBC with Differential/Platelet; Future  Vitamin B12 deficiency anemia due to intrinsic factor deficiency- His H&H are normal now. -     CBC with Differential/Platelet; Future  Diuretic-induced hypokalemia- His potassium level is normal now. -     Basic metabolic panel; Future -     Magnesium; Future  Generalized OA -     oxyCODONE-acetaminophen (PERCOCET) 10-325 MG tablet; Take 1 tablet by mouth every 6 (six) hours as needed for pain.  Left lumbar radiculopathy -     oxyCODONE-acetaminophen  (PERCOCET) 10-325 MG tablet; Take 1 tablet by mouth every 6 (six) hours as needed for pain.   I have discontinued [name]'s oxyCODONE-acetaminophen. I am also having him start on oxyCODONE-acetaminophen. Additionally, I am having him maintain his ferrous sulfate, pantoprazole, olmesartan, atorvastatin, and chlorthalidone.  Meds ordered this encounter  Medications  . oxyCODONE-acetaminophen (PERCOCET) 10-325 MG tablet    Sig: Take 1 tablet by mouth every 6 (six) hours as needed for pain.  Dispense:  100 tablet    Refill:  0     Follow-up: Return in about 4 months (around 08/22/2018).  Scarlette Calico, MD

## 2018-04-22 NOTE — Patient Instructions (Signed)

## 2018-04-25 LAB — PAIN MGMT, PROFILE 8 W/CONF, U
6 Acetylmorphine: NEGATIVE ng/mL (ref <10)
Alcohol Metabolites: NEGATIVE ng/mL (ref <500)
Amphetamines: NEGATIVE ng/mL (ref <500)
Benzodiazepines: NEGATIVE ng/mL (ref <100)
Buprenorphine, Urine: NEGATIVE ng/mL (ref <5)
Cocaine Metabolite: NEGATIVE ng/mL (ref <150)
Codeine: NEGATIVE ng/mL (ref <50)
Creatinine: 127 mg/dL
Hydrocodone: NEGATIVE ng/mL (ref <50)
Hydromorphone: NEGATIVE ng/mL (ref <50)
MDMA: NEGATIVE ng/mL (ref <500)
Marijuana Metabolite: NEGATIVE ng/mL (ref <20)
Morphine: NEGATIVE ng/mL (ref <50)
Norhydrocodone: NEGATIVE ng/mL (ref <50)
Noroxycodone: 1679 ng/mL — ABNORMAL HIGH (ref <50)
Opiates: NEGATIVE ng/mL (ref <100)
Oxidant: NEGATIVE ug/mL (ref <200)
Oxycodone: 1122 ng/mL — ABNORMAL HIGH (ref <50)
Oxycodone: POSITIVE ng/mL — AB (ref <100)
Oxymorphone: 2456 ng/mL — ABNORMAL HIGH (ref <50)
pH: 5.5 (ref 4.5–9.0)

## 2018-06-03 ENCOUNTER — Other Ambulatory Visit: Payer: Self-pay | Admitting: Internal Medicine

## 2018-06-03 DIAGNOSIS — M5416 Radiculopathy, lumbar region: Secondary | ICD-10-CM

## 2018-06-03 DIAGNOSIS — M159 Polyosteoarthritis, unspecified: Secondary | ICD-10-CM

## 2018-06-03 NOTE — Telephone Encounter (Signed)
Copied from [place] [phone]. Topic: Quick Communication - Rx Refill/Question >> Jun 03, 2018  3:36 PM Keene Breath wrote: Medication: oxyCODONE-acetaminophen (PERCOCET) 10-325 MG tablet  Patient called to request a refill for the above medication  Preferred Pharmacy (with phone number or street name): [id number] [place], [place] [phone] (Phone) [phone] (Fax)

## 2018-06-04 MED ORDER — OXYCODONE-ACETAMINOPHEN 10-325 MG PO TABS: 1.0000 | ORAL_TABLET | Freq: Four times a day (QID) | ORAL | 0 refills | Status: DC | PRN

## 2018-06-07 ENCOUNTER — Other Ambulatory Visit: Payer: Self-pay | Admitting: Internal Medicine

## 2018-06-07 DIAGNOSIS — I10 Essential (primary) hypertension: Secondary | ICD-10-CM

## 2018-06-30 ENCOUNTER — Other Ambulatory Visit: Payer: Self-pay | Admitting: Internal Medicine

## 2018-06-30 DIAGNOSIS — M5416 Radiculopathy, lumbar region: Secondary | ICD-10-CM

## 2018-06-30 DIAGNOSIS — M159 Polyosteoarthritis, unspecified: Secondary | ICD-10-CM

## 2018-06-30 NOTE — Telephone Encounter (Signed)
Copied from [place] [id number]. Topic: Quick Communication - Rx Refill/Question >> Jun 30, 2018  2:07 PM Carolyn Stare wrote: Medication   oxyCODONE-acetaminophen (PERCOCET) 10-325 MG tablet  ( [facility]  [place]   Agent: Please be advised that RX refills may take up to 3 business days. We ask that you follow-up with your pharmacy.

## 2018-06-30 NOTE — Telephone Encounter (Signed)
Requested medication (s) are due for refill today: yes  Requested medication (s) are on the active medication list: yes    Last refill: 06/04/2018  #100 0 refills  Future visit scheduled no  Notes to clinic:not delegated  Requested Prescriptions  Pending Prescriptions Disp Refills   oxyCODONE-acetaminophen (PERCOCET) 10-325 MG tablet 100 tablet 0    Sig: Take 1 tablet by mouth every 6 (six) hours as needed for pain.     Not Delegated - Analgesics:  Opioid Agonist Combinations Failed - 06/30/2018  2:43 PM      Failed - This refill cannot be delegated      Passed - Urine Drug Screen completed in last 360 days.      Passed - Valid encounter within last 6 months    Recent Outpatient Visits          2 months ago Benign essential HTN   [place], MD   4 months ago Chronic right shoulder pain   [place], [place], DO   5 months ago Thrombocytosis [facility])   [place], MD   7 months ago Chronic right shoulder pain   [place], [place], DO   8 months ago Other iron deficiency anemias   [facility] -[facility] Camel, MD

## 2018-07-01 ENCOUNTER — Other Ambulatory Visit: Payer: Self-pay | Admitting: Internal Medicine

## 2018-07-02 MED ORDER — OXYCODONE-ACETAMINOPHEN 10-325 MG PO TABS: 1.0000 | ORAL_TABLET | Freq: Four times a day (QID) | ORAL | 0 refills | Status: DC | PRN

## 2018-07-30 ENCOUNTER — Ambulatory Visit: Payer: Medicare Other | Admitting: Family Medicine

## 2018-07-31 ENCOUNTER — Telehealth: Payer: Self-pay | Admitting: Internal Medicine

## 2018-07-31 ENCOUNTER — Other Ambulatory Visit: Payer: Self-pay | Admitting: Internal Medicine

## 2018-07-31 DIAGNOSIS — M159 Polyosteoarthritis, unspecified: Secondary | ICD-10-CM

## 2018-07-31 DIAGNOSIS — M5416 Radiculopathy, lumbar region: Secondary | ICD-10-CM

## 2018-07-31 MED ORDER — OXYCODONE-ACETAMINOPHEN 10-325 MG PO TABS: 1.0000 | ORAL_TABLET | Freq: Four times a day (QID) | ORAL | 0 refills | Status: DC | PRN

## 2018-07-31 NOTE — Telephone Encounter (Signed)
Rf rq for oxycodone to [facility].   Per Database Last filled on 07/02/2018

## 2018-07-31 NOTE — Telephone Encounter (Signed)
Copied from [place] [phone]. Topic: Quick Communication - Rx Refill/Question >> Jul 31, 2018 11:52 AM Margot Ables wrote: Medication: oxyCODONE-acetaminophen (PERCOCET) 10-325 MG tablet - pt has 2-3 pills left  Has the patient contacted their pharmacy? No - controlled Preferred Pharmacy (with phone number or street name): [id number] [place], [place] [phone] (Phone) [phone] (Fax)

## 2018-08-20 ENCOUNTER — Emergency Department (HOSPITAL_COMMUNITY): Payer: Medicare Other

## 2018-08-20 ENCOUNTER — Other Ambulatory Visit: Payer: Self-pay

## 2018-08-20 ENCOUNTER — Emergency Department (HOSPITAL_COMMUNITY)
Admission: EM | Admit: 2018-08-20 | Discharge: 2018-08-20 | Disposition: A | Discharge status: Home / Self Care | Payer: Medicare Other | Attending: Emergency Medicine | Admitting: Emergency Medicine

## 2018-08-20 ENCOUNTER — Encounter (HOSPITAL_COMMUNITY): Payer: Self-pay | Admitting: Emergency Medicine

## 2018-08-20 DIAGNOSIS — I10 Essential (primary) hypertension: Secondary | ICD-10-CM | POA: Insufficient documentation

## 2018-08-20 DIAGNOSIS — Z79899 Other long term (current) drug therapy: Secondary | ICD-10-CM | POA: Diagnosis not present

## 2018-08-20 DIAGNOSIS — R1031 Right lower quadrant pain: Secondary | ICD-10-CM | POA: Diagnosis not present

## 2018-08-20 DIAGNOSIS — F17210 Nicotine dependence, cigarettes, uncomplicated: Secondary | ICD-10-CM | POA: Insufficient documentation

## 2018-08-20 LAB — COMPREHENSIVE METABOLIC PANEL
ALT: 14 U/L (ref 0–44)
AST: 27 U/L (ref 15–41)
Albumin: 3.8 g/dL (ref 3.5–5.0)
Alkaline Phosphatase: 109 U/L (ref 38–126)
Anion gap: 11 (ref 5–15)
BUN: 18 mg/dL (ref 8–23)
CO2: 28 mmol/L (ref 22–32)
Calcium: 9.6 mg/dL (ref 8.9–10.3)
Chloride: 97 mmol/L — ABNORMAL LOW (ref 98–111)
Creatinine, Ser: 1.39 mg/dL — ABNORMAL HIGH (ref 0.61–1.24)
GFR calc Af Amer: 56 mL/min — ABNORMAL LOW (ref >60)
GFR calc non Af Amer: 49 mL/min — ABNORMAL LOW (ref >60)
Glucose, Bld: 100 mg/dL — ABNORMAL HIGH (ref 70–99)
Potassium: 3.3 mmol/L — ABNORMAL LOW (ref 3.5–5.1)
Sodium: 136 mmol/L (ref 135–145)
Total Bilirubin: 1 mg/dL (ref 0.3–1.2)
Total Protein: 8.4 g/dL — ABNORMAL HIGH (ref 6.5–8.1)

## 2018-08-20 LAB — URINALYSIS, ROUTINE W REFLEX MICROSCOPIC
Bacteria, UA: NONE SEEN
Bilirubin Urine: NEGATIVE
Glucose, UA: NEGATIVE mg/dL
Hgb urine dipstick: NEGATIVE
Ketones, ur: NEGATIVE mg/dL
Nitrite: NEGATIVE
Protein, ur: NEGATIVE mg/dL
Specific Gravity, Urine: 1.019 (ref 1.005–1.030)
pH: 5 (ref 5.0–8.0)

## 2018-08-20 LAB — CBC
HCT: 42.6 % (ref 39.0–52.0)
Hemoglobin: 14.1 g/dL (ref 13.0–17.0)
MCH: 29.9 pg (ref 26.0–34.0)
MCHC: 33.1 g/dL (ref 30.0–36.0)
MCV: 90.3 fL (ref 80.0–100.0)
Platelets: 310 10*3/uL (ref 150–400)
RBC: 4.72 MIL/uL (ref 4.22–5.81)
RDW: 14.4 % (ref 11.5–15.5)
WBC: 9.6 10*3/uL (ref 4.0–10.5)
nRBC: 0 % (ref 0.0–0.2)

## 2018-08-20 LAB — LIPASE, BLOOD: Lipase: 41 U/L (ref 11–51)

## 2018-08-20 MED ORDER — SODIUM CHLORIDE 0.9% FLUSH: 3.0000 mL | Freq: Once | INTRAVENOUS | Status: DC

## 2018-08-20 MED ORDER — MORPHINE SULFATE (PF) 4 MG/ML IV SOLN
4.0000 mg | Freq: Once | INTRAVENOUS | Status: AC
  Administered 2018-08-20: 4 mg via INTRAVENOUS
  Filled 2018-08-20: qty 1

## 2018-08-20 MED ORDER — IOHEXOL 300 MG/ML  SOLN
100.0000 mL | Freq: Once | INTRAMUSCULAR | Status: AC | PRN
  Administered 2018-08-20: 100 mL via INTRAVENOUS

## 2018-08-20 MED ORDER — POTASSIUM CHLORIDE CRYS ER 20 MEQ PO TBCR
40.0000 meq | EXTENDED_RELEASE_TABLET | Freq: Once | ORAL | Status: AC
  Administered 2018-08-20: 40 meq via ORAL
  Filled 2018-08-20: qty 2

## 2018-08-20 NOTE — Discharge Instructions (Signed)
Continue to take your home medicines as prescribed.  Follow-up with your primary care physician or gastroenterology for reevaluation of your symptoms within the next 3 to 7 days.  Return to the emergency department immediately if any concerning signs or symptoms develop such as severe abdominal pains, high fevers, persistent vomiting.

## 2018-08-20 NOTE — ED Notes (Signed)
Patient transported to CT 

## 2018-08-20 NOTE — ED Notes (Signed)
Urine Culture collected and sent to main lab. 

## 2018-08-20 NOTE — ED Notes (Signed)
Patient verbalizes understanding of discharge instructions. Opportunity for questioning and answers were provided. Armband removed by staff, pt discharged from ED. Pt wheeled to lobby and taken home by family 

## 2018-08-20 NOTE — ED Provider Notes (Signed)
[facility] EMERGENCY DEPARTMENT Provider Note   CSN: [id number] Arrival date & time: 08/20/18  1407    History   Chief Complaint Chief Complaint  Patient presents with   Abdominal Pain    HPI Adrian Miller is a 78 y.o. male with history of hypertension, esophageal stricture, hyponatremia, GERD, hyperlipidemia presents for evaluation of gradual onset, progressively worsening right lower quadrant abdominal pain.  He reports that symptoms have been present since his laparoscopic cholecystectomy in March 2019 but "has been getting worse here lately ".  He has a hard time quantifying the duration of his symptoms.  He reports sharp stabbing pains primarily to the right lower quadrant of the abdomen but at times radiating up and around to the back.  Worsens with movement.  Sometimes associated with nausea and constipation.  He has been taking his home oxycodone without significant relief of his symptoms.  He denies urinary symptoms, fevers, vomiting, fevers, chills, chest pain, or shortness of breath.  He reports that he was told that he has a hernia in this area.     The history is provided by the patient.    Past Medical History:  Diagnosis Date   Acute esophagitis 06/01/2015   Benign essential HTN 05/31/2015   Duodenal ulcer hemorrhage    Esophageal stricture 06/01/2015   Hiatal hernia 06/01/2015    Patient Active Problem List   Diagnosis Date Noted   PAD (peripheral artery disease) ([place]) 01/22/2018   Long-term current use of opiate analgesic 10/17/2017   Thrombocytosis ([place]) 10/17/2017   Renal insufficiency 09/29/2017   Encounter for long-term use of opiate analgesic 08/09/2017   Bradycardia 11/26/2016   Left lumbar radiculopathy 01/18/2016   Left carpal tunnel syndrome 12/28/2015   Hyperlipidemia with target LDL less than 130 12/26/2015   GERD (gastroesophageal reflux disease) 12/19/2015   Tobacco abuse counseling 12/19/2015   Prediabetes  06/14/2015   Generalized OA 06/14/2015   B12 deficiency anemia 06/14/2015   Other iron deficiency anemias 06/14/2015   Esophageal stricture 06/01/2015   Benign essential HTN 05/31/2015   Duodenal ulcer hemorrhage     Past Surgical History:  Procedure Laterality Date   CHOLECYSTECTOMY N/A 09/30/2017   Procedure: LAPAROSCOPIC CHOLECYSTECTOMY WITH INTRAOPERATIVE CHOLANGIOGRAM;  Surgeon: Excell Seltzer, MD;  Location: [facility];  Service: General;  Laterality: N/A;   ESOPHAGOGASTRODUODENOSCOPY N/A 05/31/2015   Procedure: ESOPHAGOGASTRODUODENOSCOPY (EGD);  Surgeon: Ladene Artist, MD;  Location: [facility];  Service: Endoscopy;  Laterality: N/A;   HERNIA REPAIR  1975   LEFT ROTATOR CUFF REPAIR  2002   REPLACEMENT TOTAL KNEE Left    REVERSE SHOULDER ARTHROPLASTY Right 03/20/2018   Procedure: RIGHT REVERSE SHOULDER ARTHROPLASTY;  Surgeon: Tania Ade, MD;  Location: [facility];  Service: Orthopedics;  Laterality: Right;   TOTAL KNEE ARTHROPLASTY Right 02/22/2015   Procedure: TOTAL KNEE ARTHROPLASTY;  Surgeon: Melrose Nakayama, MD;  Location: [facility];  Service: Orthopedics;  Laterality: Right;        Home Medications    Prior to Admission medications   Medication Sig Start Date End Date Taking? Authorizing Provider  atorvastatin (LIPITOR) 20 MG tablet Take 1 tablet (20 mg total) by mouth daily. 03/04/18  Yes Janith Lima, MD  chlorthalidone (HYGROTON) 25 MG tablet TAKE 1 TABLET BY MOUTH EVERY DAY Patient taking differently: Take 25 mg by mouth daily.  06/08/18  Yes Janith Lima, MD  oxyCODONE-acetaminophen (PERCOCET) 10-325 MG tablet Take 1 tablet by mouth every 6 (six) hours as needed  for pain. 07/31/18  Yes Janith Lima, MD  ferrous sulfate 325 (65 FE) MG tablet Take 1 tablet (325 mg total) by mouth 3 (three) times daily with meals. Patient not taking: Reported on 08/20/2018 06/14/15   Janith Lima, MD  olmesartan (BENICAR) 40 MG tablet Take 1 tablet (40 mg total) by  mouth daily. Patient not taking: Reported on 08/20/2018 12/18/17   Janith Lima, MD  pantoprazole (PROTONIX) 40 MG tablet Take 1 tablet (40 mg total) by mouth daily. Patient not taking: Reported on 08/20/2018 10/17/17   Janith Lima, MD    Family History Family History  Problem Relation Age of Onset   Hypertension Mother    Heart attack Sister     Social History Social History   Tobacco Use   Smoking status: Current Every Day Smoker    Packs/day: 0.50    Years: 51.00    Pack years: 25.50    Types: Cigarettes   Smokeless tobacco: Former Systems developer    Quit date: 11/03/2014  Substance Use Topics   Alcohol use: No    Alcohol/week: 0.0 standard drinks   Drug use: No     Allergies   Asa [aspirin]   Review of Systems Review of Systems  Constitutional: Negative for chills and fever.  Respiratory: Negative for shortness of breath.   Cardiovascular: Negative for chest pain.  Gastrointestinal: Positive for abdominal pain and nausea. Negative for vomiting.  Genitourinary: Negative for frequency and urgency.  All other systems reviewed and are negative.    Physical Exam Updated Vital Signs BP 130/80    Pulse 60    Temp 97.7 F (36.5 C) (Oral)    Resp 16    SpO2 98%   Physical Exam Vitals signs and nursing note reviewed.  Constitutional:      General: He is not in acute distress.    Appearance: He is well-developed.  HENT:     Head: Normocephalic and atraumatic.  Eyes:     General:        Right eye: No discharge.        Left eye: No discharge.     Conjunctiva/sclera: Conjunctivae normal.  Neck:     Vascular: No JVD.     Trachea: No tracheal deviation.  Cardiovascular:     Rate and Rhythm: Normal rate and regular rhythm.  Pulmonary:     Effort: Pulmonary effort is normal.     Breath sounds: Normal breath sounds.  Abdominal:     General: Abdomen is protuberant. A surgical scar is present. Bowel sounds are increased. There is no distension.     Palpations:  Abdomen is soft.     Tenderness: There is abdominal tenderness in the right upper quadrant, right lower quadrant and suprapubic area. There is no right CVA tenderness, left CVA tenderness, guarding or rebound.  Skin:    General: Skin is warm and dry.     Findings: No erythema.  Neurological:     Mental Status: He is alert.  Psychiatric:        Behavior: Behavior normal.      ED Treatments / Results  Labs (all labs ordered are listed, but only abnormal results are displayed) Labs Reviewed  COMPREHENSIVE METABOLIC PANEL - Abnormal; Notable for the following components:      Result Value   Potassium 3.3 (*)    Chloride 97 (*)    Glucose, Bld 100 (*)    Creatinine, Ser 1.39 (*)    Total  Protein 8.4 (*)    GFR calc non Af Amer 49 (*)    GFR calc Af Amer 56 (*)    All other components within normal limits  URINALYSIS, ROUTINE W REFLEX MICROSCOPIC - Abnormal; Notable for the following components:   Leukocytes,Ua TRACE (*)    All other components within normal limits  LIPASE, BLOOD  CBC    EKG None  Radiology Ct Abdomen Pelvis W Contrast  Result Date: 08/20/2018 CLINICAL DATA:  Hernia. Right lower quadrant pain EXAM: CT ABDOMEN AND PELVIS WITH CONTRAST TECHNIQUE: Multidetector CT imaging of the abdomen and pelvis was performed using the standard protocol following bolus administration of intravenous contrast. CONTRAST:  148mL OMNIPAQUE IOHEXOL 300 MG/ML  SOLN COMPARISON:  None. FINDINGS: LOWER CHEST: There is no basilar pleural or apical pericardial effusion. HEPATOBILIARY: The hepatic contours and density are normal. There is no intra- or extrahepatic biliary dilatation. Status post cholecystectomy. PANCREAS: The pancreatic parenchymal contours are normal and there is no ductal dilatation. There is no peripancreatic fluid collection. SPLEEN: Normal. ADRENALS/URINARY TRACT: --Adrenal glands: Normal. --Right kidney/ureter: No hydronephrosis, nephroureterolithiasis, perinephric  stranding or solid renal mass. --Left kidney/ureter: There is an upper pole cyst measuring 5.4 cm. No hydronephrosis, nephroureterolithiasis, perinephric stranding or solid renal mass. --Urinary bladder: Normal for degree of distention STOMACH/BOWEL: --Stomach/Duodenum: There is no hiatal hernia or other gastric abnormality. The duodenal course and caliber are normal. --Small bowel: No dilatation or inflammation. --Colon: Rectosigmoid diverticulosis without acute inflammation. --Appendix: Normal. VASCULAR/LYMPHATIC: There is aortic atherosclerosis without hemodynamically significant stenosis. The portal vein, splenic vein, superior mesenteric vein and IVC are patent. No abdominal or pelvic lymphadenopathy. REPRODUCTIVE: Enlarged prostate measures 5.4 cm in transverse dimension. MUSCULOSKELETAL. Multilevel degenerative disc disease and facet arthrosis. No bony spinal canal stenosis. OTHER: None. IMPRESSION: 1. No acute abnormality of the abdomen or pelvis. 2. Rectosigmoid diverticulosis without acute inflammation. 3. No inguinal or ventral abdominal hernia. Electronically Signed   By: Ulyses Jarred M.D.   On: 08/20/2018 17:59    Procedures Procedures (including critical care time)  Medications Ordered in ED Medications  sodium chloride flush (NS) 0.9 % injection 3 mL (0 mLs Intravenous Hold 08/20/18 1622)  morphine 4 MG/ML injection 4 mg (4 mg Intravenous Given 08/20/18 1710)  iohexol (OMNIPAQUE) 300 MG/ML solution 100 mL (100 mLs Intravenous Contrast Given 08/20/18 1742)  potassium chloride SA (K-DUR,KLOR-CON) CR tablet 40 mEq (40 mEq Oral Given 08/20/18 1851)     Initial Impression / Assessment and Plan / ED Course  I have reviewed the triage vital signs and the nursing notes.  Pertinent labs & imaging results that were available during my care of the patient were reviewed by me and considered in my medical decision making (see chart for details).        Patient presenting for evaluation of  right lower quadrant abdominal pain for almost a year, worsened recently.  He is afebrile, vital signs are stable.  Nontoxic in appearance.  No peritoneal signs on examination of the abdomen.  Lab work reviewed by me shows no leukocytosis, no anemia, mildly elevated creatinine.  Mild hypokalemia with potassium of 3.3, replaced orally in the ED.  UA does not suggest UTI or nephrolithiasis.  CT of the abdomen pelvis shows no evidence of acute abdominal pathology including obstruction, perforation, appendicitis, dissection.  On reevaluation patient is resting comfortably no apparent distress.  Tolerating p.o. food and fluids without difficulty.  Recommend follow-up with PCP or GI for reevaluation of symptoms.  Discussed strict  ED return precautions. Pt verbalized understanding of and agreement with plan and is safe for discharge home at this time.  Patient was seen and evaluated by Dr. Gilford Raid who agrees with assessment and plan at this time.  Final Clinical Impressions(s) / ED Diagnoses   Final diagnoses:  Right lower quadrant abdominal pain    ED Discharge Orders    None       Debroah Baller 08/20/18 2038    Isla Pence, MD 08/22/18 1311

## 2018-08-20 NOTE — ED Triage Notes (Signed)
Pt reports RLQ almost inguinal pain. States "I had my gallbladder and mesh removed a year ago, I believe the the nicked it with knife blade so my gut is sticking out." Skin intact at triage. Denies n/v/d or fever. Pt a/o at triage.

## 2018-08-20 NOTE — ED Notes (Signed)
ED Provider at bedside. 

## 2018-08-29 ENCOUNTER — Telehealth: Payer: Self-pay | Admitting: Internal Medicine

## 2018-08-29 DIAGNOSIS — M5416 Radiculopathy, lumbar region: Secondary | ICD-10-CM

## 2018-08-29 DIAGNOSIS — M159 Polyosteoarthritis, unspecified: Secondary | ICD-10-CM

## 2018-08-29 NOTE — Telephone Encounter (Signed)
Control database checked last refill: 07/31/2018  LOV: 04/22/2018 NOV: none

## 2018-08-29 NOTE — Telephone Encounter (Signed)
Copied from [place] [id number]. Topic: Quick Communication - Rx Refill/Question >> Aug 29, 2018 11:12 AM Scherrie Gerlach wrote: Medication: oxyCODONE-acetaminophen (PERCOCET) 10-325 MG tablet [id number] [address], [place] (Phone) [phone] (Fax)

## 2018-08-30 MED ORDER — OXYCODONE-ACETAMINOPHEN 10-325 MG PO TABS: 1.0000 | ORAL_TABLET | Freq: Four times a day (QID) | ORAL | 0 refills | Status: DC | PRN

## 2018-09-12 NOTE — Telephone Encounter (Signed)
Pt called to follow up on Percocet refill. He stated he thought he needed a telephone visit with Dr. Ronnald Ramp first (unable to do virtual). Please follow up with pt.

## 2018-09-15 NOTE — Telephone Encounter (Signed)
Call to pt.  Informed rx was sent on 08/30/2018.

## 2018-09-30 ENCOUNTER — Other Ambulatory Visit: Payer: Self-pay | Admitting: Internal Medicine

## 2018-09-30 ENCOUNTER — Telehealth: Payer: Self-pay | Admitting: Internal Medicine

## 2018-09-30 DIAGNOSIS — M159 Polyosteoarthritis, unspecified: Secondary | ICD-10-CM

## 2018-09-30 DIAGNOSIS — M5416 Radiculopathy, lumbar region: Secondary | ICD-10-CM

## 2018-09-30 MED ORDER — OXYCODONE-ACETAMINOPHEN 10-325 MG PO TABS: 1.0000 | ORAL_TABLET | Freq: Four times a day (QID) | ORAL | 0 refills | Status: DC | PRN

## 2018-09-30 NOTE — Telephone Encounter (Signed)
Per database, pt last refilled oxycodone on 08/30/2018

## 2018-09-30 NOTE — Telephone Encounter (Signed)
Copied from [place] [id number]. Topic: Quick Communication - Rx Refill/Question >> Sep 30, 2018  8:42 AM Ahmed Prima L wrote: Medication: oxyCODONE-acetaminophen (PERCOCET) 10-325 MG tablet  Has the patient contacted their pharmacy? No (Agent: If no, request that the patient contact the pharmacy for the refill.) (Agent: If yes, when and what did the pharmacy advise? Preferred Pharmacy (with phone number or street name): [id number] [address], [place] [phone] (Phone) [phone] (Fax)    Agent: Please be advised that RX refills may take up to 3 business days. We ask that you follow-up with your pharmacy.

## 2018-10-27 ENCOUNTER — Telehealth: Payer: Self-pay | Admitting: Internal Medicine

## 2018-10-27 NOTE — Telephone Encounter (Signed)
Medication Refill - Medication: oxyCODONE-acetaminophen (PERCOCET) 10-325 MG tablet  Has the patient contacted their pharmacy? no (Agent: If no, request that the patient contact the pharmacy for the refill.) (Agent: If yes, when and what did the pharmacy advise?)  Preferred Pharmacy (with phone number or street name):  [id number] [address], [place] [phone] (Phone) [phone] (Fax)    Agent: Please be advised that RX refills may take up to 3 business days. We ask that you follow-up with your pharmacy.

## 2018-10-28 ENCOUNTER — Ambulatory Visit: Payer: Medicare Other | Admitting: Family Medicine

## 2018-10-30 ENCOUNTER — Other Ambulatory Visit: Payer: Self-pay | Admitting: Internal Medicine

## 2018-10-30 DIAGNOSIS — M5416 Radiculopathy, lumbar region: Secondary | ICD-10-CM

## 2018-10-30 DIAGNOSIS — M159 Polyosteoarthritis, unspecified: Secondary | ICD-10-CM

## 2018-10-30 MED ORDER — OXYCODONE-ACETAMINOPHEN 10-325 MG PO TABS: 1.0000 | ORAL_TABLET | Freq: Four times a day (QID) | ORAL | 0 refills | Status: DC | PRN

## 2018-10-30 NOTE — Telephone Encounter (Signed)
Pt called to check the status of his refill request for oxyCODONE-acetaminophen (PERCOCET) 10-325 MG tablet / please advise

## 2018-11-11 ENCOUNTER — Encounter: Payer: Self-pay | Admitting: Family Medicine

## 2018-11-11 ENCOUNTER — Other Ambulatory Visit: Payer: Self-pay

## 2018-11-11 ENCOUNTER — Ambulatory Visit: Payer: Self-pay

## 2018-11-11 ENCOUNTER — Ambulatory Visit (INDEPENDENT_AMBULATORY_CARE_PROVIDER_SITE_OTHER): Payer: Medicare Other | Admitting: Family Medicine

## 2018-11-11 VITALS — BP 132/82 | HR 76 | Ht 69.0 in

## 2018-11-11 DIAGNOSIS — M25551 Pain in right hip: Secondary | ICD-10-CM

## 2018-11-11 DIAGNOSIS — M1611 Unilateral primary osteoarthritis, right hip: Secondary | ICD-10-CM | POA: Diagnosis not present

## 2018-11-11 DIAGNOSIS — G5602 Carpal tunnel syndrome, left upper limb: Secondary | ICD-10-CM

## 2018-11-11 NOTE — Assessment & Plan Note (Signed)
.    Patient's has not responded well to gabapentin in the past.Patient given injection and tolerated the procedure well.  Discussed icing regimen and home exercise.  Discussed which activities of doing which wants to avoid. Has not responded well to gabapentin in the past  Bracing  RTC in 4-6 weeks

## 2018-11-11 NOTE — Progress Notes (Signed)
[facility] [address] [place], [place] [address] Phone: [phone] Subjective:   Adrian Miller, am serving as a scribe for Dr. Hulan Saas.    CC: Right hip pain, left wrist pain  [id number]  Adrian Miller is a 78 y.o. male coming in with complaint of hip pain. Last seen on 02/05/2018 for shoulder pain. Patient states that his pain in right hip.  Known arthritic changes.  Last injection was 15 months ago.  Was doing well into the last several weeks.  Now having difficulty even going up or down 5 stairs in a row.  Sometimes feels like he is unstable.  Feels he puts more pressure on his reconstructed shoulder and wants to avoid it.  Patient has had carpal tunnel as well.  Has responded well to injections.  Last one was in October 2019.  Patient states more numbness again and tingling.  Severely often affects daily activities such as dressing.     Past Medical History:  Diagnosis Date  . Acute esophagitis 06/01/2015  . Benign essential HTN 05/31/2015  . Duodenal ulcer hemorrhage   . Esophageal stricture 06/01/2015  . Hiatal hernia 06/01/2015   Past Surgical History:  Procedure Laterality Date  . CHOLECYSTECTOMY N/A 09/30/2017   Procedure: LAPAROSCOPIC CHOLECYSTECTOMY WITH INTRAOPERATIVE CHOLANGIOGRAM;  Surgeon: Excell Seltzer, MD;  Location: [facility];  Service: General;  Laterality: N/A;  . ESOPHAGOGASTRODUODENOSCOPY N/A 05/31/2015   Procedure: ESOPHAGOGASTRODUODENOSCOPY (EGD);  Surgeon: Ladene Artist, MD;  Location: [facility];  Service: Endoscopy;  Laterality: N/A;  . HERNIA REPAIR  1975  . LEFT ROTATOR CUFF REPAIR  2002  . REPLACEMENT TOTAL KNEE Left   . REVERSE SHOULDER ARTHROPLASTY Right 03/20/2018   Procedure: RIGHT REVERSE SHOULDER ARTHROPLASTY;  Surgeon: Tania Ade, MD;  Location: [facility];  Service: Orthopedics;  Laterality: Right;  . TOTAL KNEE ARTHROPLASTY Right 02/22/2015   Procedure: TOTAL KNEE ARTHROPLASTY;  Surgeon: Melrose Nakayama, MD;  Location: [facility];  Service: Orthopedics;  Laterality: Right;   Social History   Socioeconomic History  . Marital status: Single    Spouse name: Not on file  . Number of children: Not on file  . Years of education: Not on file  . Highest education level: Not on file  Occupational History  . Not on file  Social Needs  . Financial resource strain: Not on file  . Food insecurity    Worry: Not on file    Inability: Not on file  . Transportation needs    Medical: Not on file    Non-medical: Not on file  Tobacco Use  . Smoking status: Current Every Day Smoker    Packs/day: 0.50    Years: 51.00    Pack years: 25.50    Types: Cigarettes  . Smokeless tobacco: Former Systems developer    Quit date: 11/03/2014  Substance and Sexual Activity  . Alcohol use: Miller    Alcohol/week: 0.0 standard drinks  . Drug use: Miller  . Sexual activity: Never  Lifestyle  . Physical activity    Days per week: Not on file    Minutes per session: Not on file  . Stress: Not on file  Relationships  . Social Herbalist on phone: Not on file    Gets together: Not on file    Attends religious service: Not on file    Active member of club or organization: Not on file    Attends meetings of clubs or  organizations: Not on file    Relationship status: Not on file  Other Topics Concern  . Not on file  Social History Narrative  . Not on file   Allergies  Allergen Reactions  . Asa [Aspirin] Other (See Comments)    Angioedema- patient is unaware of this (??)   Family History  Problem Relation Age of Onset  . Hypertension Mother   . Heart attack Sister      Current Outpatient Medications (Cardiovascular):  .  atorvastatin (LIPITOR) 20 MG tablet, Take 1 tablet (20 mg total) by mouth daily. .  chlorthalidone (HYGROTON) 25 MG tablet, TAKE 1 TABLET BY MOUTH EVERY DAY (Patient taking differently: Take 25 mg by mouth daily. ) .  olmesartan (BENICAR) 40 MG tablet, Take 1 tablet (40 mg total) by  mouth daily.   Current Outpatient Medications (Analgesics):  .  oxyCODONE-acetaminophen (PERCOCET) 10-325 MG tablet, Take 1 tablet by mouth every 6 (six) hours as needed for pain.  Current Outpatient Medications (Hematological):  .  ferrous sulfate 325 (65 FE) MG tablet, Take 1 tablet (325 mg total) by mouth 3 (three) times daily with meals.  Current Outpatient Medications (Other):  .  pantoprazole (PROTONIX) 40 MG tablet, Take 1 tablet (40 mg total) by mouth daily.    Past medical history, social, surgical and family history all reviewed in electronic medical record.  Miller pertanent information unless stated regarding to the chief complaint.   Review of Systems:  Miller headache, visual changes, nausea, vomiting, diarrhea, constipation, dizziness, abdominal pain, skin rash, fevers, chills, night sweats, weight loss, swollen lymph nodes, body aches, joint swelling, chest pain, shortness of breath, mood changes.  Positive muscle aches  Objective  Blood pressure 132/82, pulse 76, height 5\' 9"  (1.753 m).    General: Miller apparent distress alert and oriented x3 mood and affect normal, dressed appropriately.  HEENT: Pupils equal, extraocular movements intact  Respiratory: Patient's speak in full sentences and does not appear short of breath  Cardiovascular: trace lower extremity edema, non tender, Miller erythema  Skin: Warm dry intact with Miller signs of infection or rash on extremities or on axial skeleton.  Abdomen: Soft nontender  Neuro: Cranial nerves II through XII are intact, neurovascularly intact in all extremities with 2+ DTRs and 2+ pulses.  Lymph: Miller lymphadenopathy of posterior or anterior cervical chain or axillae bilaterally.  Gait antalgic gait MSK:   Right hip exam has 0 degrees of internal rotation with severe pain.  Patient has mild positive straight leg with radicular symptoms down the right leg.  Patient has 4-5 strength of the right hip compared to the contralateral side.  Tender  to palpation in the groin area.  Left wrist exam shows a positive Tinel sign.  Patient does have moderate arthritic changes.  Patient has some mild limited range of motion of 5 to 10 degrees in all planes.  Grip strength is 4 out of 5 compared to the contralateral side  Procedure: Real-time Ultrasound Guided Injection of right hip Device: GE Logiq Q7  Ultrasound guided injection is preferred based studies that show increased duration, increased effect, greater accuracy, decreased procedural pain, increased response rate with ultrasound guided versus blind injection.  Verbal informed consent obtained.  Time-out conducted.  Noted Miller overlying erythema, induration, or other signs of local infection.  Skin prepped in a sterile fashion.  Local anesthesia: Topical Ethyl chloride.  With sterile technique and under real time ultrasound guidance:  Anterior capsule visualized, needle visualized going to the  head neck junction at the anterior capsule. Pictures taken. Patient did have injection of  3 cc of 0.5% Marcaine, and 1 cc of Kenalog 40 mg/dL. Completed without difficulty  Pain immediately resolved suggesting accurate placement of the medication.  Advised to call if fevers/chills, erythema, induration, drainage, or persistent bleeding.  Images permanently stored and available for review in the ultrasound unit.  Impression: Technically successful ultrasound guided injection.  Procedure: Real-time Ultrasound Guided Injection of left carpal tunnel Device: GE Logiq Q7 Ultrasound guided injection is preferred based studies that show increased duration, increased effect, greater accuracy, decreased procedural pain, increased response rate with ultrasound guided versus blind injection.  Verbal informed consent obtained.  Time-out conducted.  Noted Miller overlying erythema, induration, or other signs of local infection.  Skin prepped in a sterile fashion.  Local anesthesia: Topical Ethyl chloride.  With  sterile technique and under real time ultrasound guidance:  median nerve visualized.  23g 5/8 inch needle inserted distal to proximal approach into nerve sheath. Pictures taken nfor needle placement. Patient did have injection of 2 cc of 0.5% Marcaine, and 1 cc of Kenalog 40 mg/dL. Completed without difficulty  Pain immediately resolved suggesting accurate placement of the medication.  Advised to call if fevers/chills, erythema, induration, drainage, or persistent bleeding.  Images permanently stored and available for review in the ultrasound unit.  Impression: Technically successful ultrasound guided injection.   Impression and Recommendations:     This case required medical decision making of moderate complexity. The above documentation has been reviewed and is accurate and complete Lyndal Pulley, DO       Note: This dictation was prepared with Dragon dictation along with smaller phrase technology. Any transcriptional errors that result from this process are unintentional.

## 2018-11-11 NOTE — Patient Instructions (Addendum)
Good to see you  Stay safe  Injected the hip again and has been over a year.  Can repeat every 3 months if needed See me again in 3 months if needed.

## 2018-11-11 NOTE — Assessment & Plan Note (Signed)
Repeat injection given today.  Tolerated procedure well.  Discussed icing regimen and home exercise.  Discussed topical anti-inflammatories.  Follow-up again in 4 to 8 weeks

## 2018-11-26 ENCOUNTER — Telehealth: Payer: Self-pay | Admitting: Internal Medicine

## 2018-11-26 ENCOUNTER — Other Ambulatory Visit: Payer: Self-pay | Admitting: Internal Medicine

## 2018-11-26 DIAGNOSIS — M5416 Radiculopathy, lumbar region: Secondary | ICD-10-CM

## 2018-11-26 DIAGNOSIS — M159 Polyosteoarthritis, unspecified: Secondary | ICD-10-CM

## 2018-11-26 MED ORDER — OXYCODONE-ACETAMINOPHEN 10-325 MG PO TABS: 1.0000 | ORAL_TABLET | Freq: Four times a day (QID) | ORAL | 0 refills | Status: DC | PRN

## 2018-11-26 NOTE — Telephone Encounter (Signed)
Needs to be seen  Adrian Miller

## 2018-11-26 NOTE — Telephone Encounter (Signed)
Medication Refill - Medication: oxyCODONE-acetaminophen (PERCOCET) 10-325 MG tablet    Has the patient contacted their pharmacy?Yes (Agent: If no, request that the patient contact the pharmacy for the refill.) (Agent: If yes, when and what did the pharmacy advise?)Contact PCP  Preferred Pharmacy (with phone number or street name):  [id number] [address], [place] [phone] (Phone) [phone] (Fax)     Agent: Please be advised that RX refills may take up to 3 business days. We ask that you follow-up with your pharmacy.

## 2018-11-26 NOTE — Telephone Encounter (Signed)
Last OV: 04/22/2018

## 2018-11-26 NOTE — Telephone Encounter (Signed)
Scheduled appt for pt to come in 7/28, pt would like enough medication sent to pharmacy until appt. Please advise.

## 2018-11-27 DIAGNOSIS — M5416 Radiculopathy, lumbar region: Secondary | ICD-10-CM | POA: Diagnosis not present

## 2018-11-29 ENCOUNTER — Other Ambulatory Visit: Payer: Self-pay | Admitting: Internal Medicine

## 2018-11-29 DIAGNOSIS — E785 Hyperlipidemia, unspecified: Secondary | ICD-10-CM

## 2018-12-02 ENCOUNTER — Encounter: Payer: Self-pay | Admitting: Internal Medicine

## 2018-12-02 ENCOUNTER — Other Ambulatory Visit: Payer: Self-pay

## 2018-12-02 ENCOUNTER — Other Ambulatory Visit (INDEPENDENT_AMBULATORY_CARE_PROVIDER_SITE_OTHER): Payer: Medicare Other

## 2018-12-02 ENCOUNTER — Ambulatory Visit (INDEPENDENT_AMBULATORY_CARE_PROVIDER_SITE_OTHER): Payer: Medicare Other | Admitting: Internal Medicine

## 2018-12-02 VITALS — BP 180/88 | HR 87 | Temp 98.5°F | Resp 16 | Ht 69.0 in | Wt 198.0 lb

## 2018-12-02 DIAGNOSIS — I10 Essential (primary) hypertension: Secondary | ICD-10-CM | POA: Diagnosis not present

## 2018-12-02 DIAGNOSIS — Z79891 Long term (current) use of opiate analgesic: Secondary | ICD-10-CM

## 2018-12-02 DIAGNOSIS — N289 Disorder of kidney and ureter, unspecified: Secondary | ICD-10-CM

## 2018-12-02 DIAGNOSIS — R7303 Prediabetes: Secondary | ICD-10-CM

## 2018-12-02 DIAGNOSIS — D510 Vitamin B12 deficiency anemia due to intrinsic factor deficiency: Secondary | ICD-10-CM

## 2018-12-02 DIAGNOSIS — E785 Hyperlipidemia, unspecified: Secondary | ICD-10-CM

## 2018-12-02 DIAGNOSIS — D508 Other iron deficiency anemias: Secondary | ICD-10-CM

## 2018-12-02 LAB — BASIC METABOLIC PANEL
BUN: 13 mg/dL (ref 6–23)
CO2: 29 mEq/L (ref 19–32)
Calcium: 9.1 mg/dL (ref 8.4–10.5)
Chloride: 105 mEq/L (ref 96–112)
Creatinine, Ser: 0.87 mg/dL (ref 0.40–1.50)
GFR: 102.8 mL/min (ref >60.00)
Glucose, Bld: 110 mg/dL — ABNORMAL HIGH (ref 70–99)
Potassium: 3.4 mEq/L — ABNORMAL LOW (ref 3.5–5.1)
Sodium: 140 mEq/L (ref 135–145)

## 2018-12-02 LAB — CBC WITH DIFFERENTIAL/PLATELET
Basophils Absolute: 0 10*3/uL (ref 0.0–0.1)
Basophils Relative: 0.5 % (ref 0.0–3.0)
Eosinophils Absolute: 0.3 10*3/uL (ref 0.0–0.7)
Eosinophils Relative: 3.2 % (ref 0.0–5.0)
HCT: 40.8 % (ref 39.0–52.0)
Hemoglobin: 13.6 g/dL (ref 13.0–17.0)
Lymphocytes Relative: 29.9 % (ref 12.0–46.0)
Lymphs Abs: 2.9 10*3/uL (ref 0.7–4.0)
MCHC: 33.3 g/dL (ref 30.0–36.0)
MCV: 92.8 fl (ref 78.0–100.0)
Monocytes Absolute: 0.7 10*3/uL (ref 0.1–1.0)
Monocytes Relative: 6.8 % (ref 3.0–12.0)
Neutro Abs: 5.8 10*3/uL (ref 1.4–7.7)
Neutrophils Relative %: 59.6 % (ref 43.0–77.0)
Platelets: 299 10*3/uL (ref 150.0–400.0)
RBC: 4.4 Mil/uL (ref 4.22–5.81)
RDW: 14.6 % (ref 11.5–15.5)
WBC: 9.7 10*3/uL (ref 4.0–10.5)

## 2018-12-02 LAB — HEPATIC FUNCTION PANEL
ALT: 9 U/L (ref 0–53)
AST: 12 U/L (ref 0–37)
Albumin: 4 g/dL (ref 3.5–5.2)
Alkaline Phosphatase: 91 U/L (ref 39–117)
Bilirubin, Direct: 0.1 mg/dL (ref 0.0–0.3)
Total Bilirubin: 0.5 mg/dL (ref 0.2–1.2)
Total Protein: 7.9 g/dL (ref 6.0–8.3)

## 2018-12-02 LAB — IBC PANEL
Iron: 49 ug/dL (ref 42–165)
Saturation Ratios: 14.5 % — ABNORMAL LOW (ref 20.0–50.0)
Transferrin: 242 mg/dL (ref 212.0–360.0)

## 2018-12-02 LAB — LIPID PANEL
Cholesterol: 88 mg/dL (ref 0–200)
HDL: 44.8 mg/dL (ref >39.00)
LDL Cholesterol: 29 mg/dL (ref 0–99)
NonHDL: 42.91
Total CHOL/HDL Ratio: 2
Triglycerides: 70 mg/dL (ref 0.0–149.0)
VLDL: 14 mg/dL (ref 0.0–40.0)

## 2018-12-02 LAB — FERRITIN: Ferritin: 21.9 ng/mL — ABNORMAL LOW (ref 22.0–322.0)

## 2018-12-02 LAB — HEMOGLOBIN A1C: Hgb A1c MFr Bld: 5.7 % (ref 4.6–6.5)

## 2018-12-02 MED ORDER — FERROUS SULFATE 325 (65 FE) MG PO TABS: 325.0000 mg | ORAL_TABLET | Freq: Two times a day (BID) | ORAL | 1 refills | Status: DC

## 2018-12-02 MED ORDER — NEBIVOLOL HCL 5 MG PO TABS: 5.0000 mg | ORAL_TABLET | Freq: Every day | ORAL | 1 refills | Status: DC

## 2018-12-02 MED ORDER — CHLORTHALIDONE 25 MG PO TABS: 25.0000 mg | ORAL_TABLET | Freq: Every day | ORAL | 1 refills | Status: DC

## 2018-12-02 NOTE — Progress Notes (Signed)
Subjective:  Patient ID: Adrian Miller, male    DOB: [birth year]  Age: 78 y.o. MRN: [id number]  CC: Osteoarthritis, Hypertension, Hyperlipidemia, Anemia, and Back Pain   HPI Adrian Miller presents for a BP check - He does not know if he is taking his blood pressure medicines.  According to prescription refills he would have run out of them months ago.  He continues to complain of musculoskeletal pain and wants to continue taking Percocet.  Outpatient Medications Prior to Visit  Medication Sig Dispense Refill   atorvastatin (LIPITOR) 20 MG tablet TAKE 1 TABLET BY MOUTH EVERY DAY 90 tablet 0   oxyCODONE-acetaminophen (PERCOCET) 10-325 MG tablet Take 1 tablet by mouth every 6 (six) hours as needed for pain. 100 tablet 0   pantoprazole (PROTONIX) 40 MG tablet Take 1 tablet (40 mg total) by mouth daily. 90 tablet 1   chlorthalidone (HYGROTON) 25 MG tablet TAKE 1 TABLET BY MOUTH EVERY DAY (Patient taking differently: Take 25 mg by mouth daily. ) 90 tablet 0   ferrous sulfate 325 (65 FE) MG tablet Take 1 tablet (325 mg total) by mouth 3 (three) times daily with meals. 90 tablet 5   olmesartan (BENICAR) 40 MG tablet Take 1 tablet (40 mg total) by mouth daily. 90 tablet 1   No facility-administered medications prior to visit.     ROS Review of Systems  Constitutional: Negative.  Negative for diaphoresis, fatigue and unexpected weight change.  HENT: Negative.   Eyes: Negative for visual disturbance.  Respiratory: Negative for cough, chest tightness, shortness of breath and wheezing.   Cardiovascular: Negative for chest pain, palpitations and leg swelling.  Gastrointestinal: Negative for abdominal pain, constipation, diarrhea, nausea and vomiting.  Genitourinary: Negative.  Negative for difficulty urinating, dysuria and hematuria.  Musculoskeletal: Positive for arthralgias and back pain.  Skin: Negative.  Negative for color change and pallor.  Neurological: Negative for dizziness, weakness  and light-headedness.  Hematological: Negative for adenopathy. Does not bruise/bleed easily.  Psychiatric/Behavioral: Negative.     Objective:  BP (!) 180/88 (BP Location: Left Arm, Patient Position: Sitting, Cuff Size: Normal)    Pulse 87    Temp 98.5 F (36.9 C) (Oral)    Resp 16    Ht 5\' 9"  (1.753 m)    Wt 198 lb (89.8 kg)    SpO2 96%    BMI 29.24 kg/m   BP Readings from Last 3 Encounters:  12/02/18 (!) 180/88  11/11/18 132/82  08/20/18 130/80    Wt Readings from Last 3 Encounters:  12/02/18 198 lb (89.8 kg)  04/22/18 182 lb (82.6 kg)  03/13/18 186 lb 11.2 oz (84.7 kg)    Physical Exam Vitals signs reviewed.  Constitutional:      General: He is not in acute distress.    Appearance: He is not ill-appearing, toxic-appearing or diaphoretic.  HENT:     Nose: Nose normal.     Mouth/Throat:     Mouth: Mucous membranes are moist.  Eyes:     General: No scleral icterus.    Conjunctiva/sclera: Conjunctivae normal.  Neck:     Musculoskeletal: Normal range of motion. No neck rigidity or muscular tenderness.  Cardiovascular:     Rate and Rhythm: Normal rate and regular rhythm. Occasional extrasystoles are present.    Heart sounds: Normal heart sounds, S1 normal and S2 normal. No systolic murmur. No diastolic murmur. No gallop.      Comments: EKG ---  Sinus  Rhythm  - occasional  PAC    # PACs = 1. -  Nonspecific T-abnormality.   ABNORMAL  Pulmonary:     Effort: Pulmonary effort is normal. No respiratory distress.     Breath sounds: No stridor. No wheezing, rhonchi or rales.  Abdominal:     General: Abdomen is protuberant. There is no distension.     Palpations: There is no hepatomegaly, splenomegaly or mass.     Tenderness: There is no abdominal tenderness.  Musculoskeletal: Normal range of motion.     Right lower leg: No edema.     Left lower leg: No edema.  Lymphadenopathy:     Cervical: No cervical adenopathy.  Skin:    General: Skin is warm and dry.    Neurological:     General: No focal deficit present.     Mental Status: He is alert.  Psychiatric:        Mood and Affect: Mood normal.        Behavior: Behavior normal.     Lab Results  Component Value Date   WBC 9.7 12/02/2018   HGB 13.6 12/02/2018   HCT 40.8 12/02/2018   PLT 299.0 12/02/2018   GLUCOSE 110 (H) 12/02/2018   CHOL 88 12/02/2018   TRIG 70.0 12/02/2018   HDL 44.80 12/02/2018   LDLDIRECT 40.0 10/17/2017   LDLCALC 29 12/02/2018   ALT 9 12/02/2018   AST 12 12/02/2018   NA 140 12/02/2018   K 3.4 (L) 12/02/2018   CL 105 12/02/2018   CREATININE 0.87 12/02/2018   BUN 13 12/02/2018   CO2 29 12/02/2018   TSH 1.42 11/26/2016   PSA 0.87 03/18/2014   INR 1.02 03/13/2018   HGBA1C 5.7 12/02/2018    Ct Abdomen Pelvis W Contrast  Result Date: 08/20/2018 CLINICAL DATA:  Hernia. Right lower quadrant pain EXAM: CT ABDOMEN AND PELVIS WITH CONTRAST TECHNIQUE: Multidetector CT imaging of the abdomen and pelvis was performed using the standard protocol following bolus administration of intravenous contrast. CONTRAST:  130mL OMNIPAQUE IOHEXOL 300 MG/ML  SOLN COMPARISON:  None. FINDINGS: LOWER CHEST: There is no basilar pleural or apical pericardial effusion. HEPATOBILIARY: The hepatic contours and density are normal. There is no intra- or extrahepatic biliary dilatation. Status post cholecystectomy. PANCREAS: The pancreatic parenchymal contours are normal and there is no ductal dilatation. There is no peripancreatic fluid collection. SPLEEN: Normal. ADRENALS/URINARY TRACT: --Adrenal glands: Normal. --Right kidney/ureter: No hydronephrosis, nephroureterolithiasis, perinephric stranding or solid renal mass. --Left kidney/ureter: There is an upper pole cyst measuring 5.4 cm. No hydronephrosis, nephroureterolithiasis, perinephric stranding or solid renal mass. --Urinary bladder: Normal for degree of distention STOMACH/BOWEL: --Stomach/Duodenum: There is no hiatal hernia or other gastric  abnormality. The duodenal course and caliber are normal. --Small bowel: No dilatation or inflammation. --Colon: Rectosigmoid diverticulosis without acute inflammation. --Appendix: Normal. VASCULAR/LYMPHATIC: There is aortic atherosclerosis without hemodynamically significant stenosis. The portal vein, splenic vein, superior mesenteric vein and IVC are patent. No abdominal or pelvic lymphadenopathy. REPRODUCTIVE: Enlarged prostate measures 5.4 cm in transverse dimension. MUSCULOSKELETAL. Multilevel degenerative disc disease and facet arthrosis. No bony spinal canal stenosis. OTHER: None. IMPRESSION: 1. No acute abnormality of the abdomen or pelvis. 2. Rectosigmoid diverticulosis without acute inflammation. 3. No inguinal or ventral abdominal hernia. Electronically Signed   By: Ulyses Jarred M.D.   On: 08/20/2018 17:59    Assessment & Plan:   [place] was seen today for osteoarthritis, hypertension, hyperlipidemia, anemia and back pain.  Diagnoses and all orders for this visit:  Benign essential HTN- His blood  pressure is not well controlled due to noncompliance.  I have asked him to restart chlorthalidone and to start nebivolol.. -     Basic metabolic panel; Future -     chlorthalidone (HYGROTON) 25 MG tablet; Take 1 tablet (25 mg total) by mouth daily. -     EKG 12-Lead -     nebivolol (BYSTOLIC) 5 MG tablet; Take 1 tablet (5 mg total) by mouth daily.  Renal insufficiency- His renal function is normal now.  He will avoid nephrotoxic agents. -     Basic metabolic panel; Future -     Urinalysis, Routine w reflex microscopic; Future  Vitamin B12 deficiency anemia due to intrinsic factor deficiency -     CBC with Differential/Platelet; Future  Hyperlipidemia with target LDL less than 130- He has achieved his LDL goal and is doing well on the statin. -     Hepatic function panel; Future -     Lipid panel; Future  Long-term current use of opiate analgesic- I will check a urine drug screen to monitor  for compliance and to screen for substance abuse. -     Pain Mgmt, Profile 8 w/Conf, U; Future  Other iron deficiency anemias- His H&H is normal.  His iron level is borderline low.  I have asked him to continue taking the iron supplement. -     CBC with Differential/Platelet; Future -     IBC panel; Future -     Ferritin; Future -     ferrous sulfate 325 (65 FE) MG tablet; Take 1 tablet (325 mg total) by mouth 2 (two) times daily with a meal.  Prediabetes- His A1c is at 5.7%.  His blood sugars are adequately well controlled. -     Hemoglobin A1c; Future   I have discontinued Adrian Miller's olmesartan. I have also changed his chlorthalidone and ferrous sulfate. Additionally, I am having him start on nebivolol. Lastly, I am having him maintain his pantoprazole, oxyCODONE-acetaminophen, and atorvastatin.  Meds ordered this encounter  Medications   chlorthalidone (HYGROTON) 25 MG tablet    Sig: Take 1 tablet (25 mg total) by mouth daily.    Dispense:  90 tablet    Refill:  1   nebivolol (BYSTOLIC) 5 MG tablet    Sig: Take 1 tablet (5 mg total) by mouth daily.    Dispense:  90 tablet    Refill:  1   ferrous sulfate 325 (65 FE) MG tablet    Sig: Take 1 tablet (325 mg total) by mouth 2 (two) times daily with a meal.    Dispense:  180 tablet    Refill:  1     Follow-up: Return in about 3 months (around 03/04/2019).  Scarlette Calico, MD

## 2018-12-02 NOTE — Patient Instructions (Signed)

## 2018-12-03 ENCOUNTER — Encounter: Payer: Self-pay | Admitting: Internal Medicine

## 2018-12-25 ENCOUNTER — Telehealth: Payer: Self-pay | Admitting: Internal Medicine

## 2018-12-25 NOTE — Telephone Encounter (Signed)
Medication Refill - Medication: oxyCODONE-acetaminophen (PERCOCET) 10-325 MG tablet  Has the patient contacted their pharmacy? No - states he calls the office (Agent: If no, request that the patient contact the pharmacy for the refill.) (Agent: If yes, when and what did the pharmacy advise?)  Preferred Pharmacy (with phone number or street name):  [id number] [place], [place] [phone] (Phone) [phone] (Fax)     Agent: Please be advised that RX refills may take up to 3 business days. We ask that you follow-up with your pharmacy.

## 2018-12-26 ENCOUNTER — Other Ambulatory Visit: Payer: Self-pay | Admitting: Internal Medicine

## 2018-12-26 DIAGNOSIS — M159 Polyosteoarthritis, unspecified: Secondary | ICD-10-CM

## 2018-12-26 DIAGNOSIS — M5416 Radiculopathy, lumbar region: Secondary | ICD-10-CM

## 2018-12-26 MED ORDER — OXYCODONE-ACETAMINOPHEN 10-325 MG PO TABS: 1.0000 | ORAL_TABLET | Freq: Four times a day (QID) | ORAL | 0 refills | Status: DC | PRN

## 2019-01-27 ENCOUNTER — Telehealth: Payer: Self-pay | Admitting: Internal Medicine

## 2019-01-27 ENCOUNTER — Other Ambulatory Visit: Payer: Self-pay | Admitting: Internal Medicine

## 2019-01-27 DIAGNOSIS — M159 Polyosteoarthritis, unspecified: Secondary | ICD-10-CM

## 2019-01-27 DIAGNOSIS — M5416 Radiculopathy, lumbar region: Secondary | ICD-10-CM

## 2019-01-27 MED ORDER — OXYCODONE-ACETAMINOPHEN 10-325 MG PO TABS: 1.0000 | ORAL_TABLET | Freq: Four times a day (QID) | ORAL | 0 refills | Status: DC | PRN

## 2019-01-27 NOTE — Telephone Encounter (Signed)
Pt needs a refill on oxycodone. [facility] [address]

## 2019-01-28 DIAGNOSIS — K4030 Unilateral inguinal hernia, with obstruction, without gangrene, not specified as recurrent: Secondary | ICD-10-CM | POA: Diagnosis not present

## 2019-02-23 ENCOUNTER — Telehealth: Payer: Self-pay | Admitting: Internal Medicine

## 2019-02-23 DIAGNOSIS — M159 Polyosteoarthritis, unspecified: Secondary | ICD-10-CM

## 2019-02-23 DIAGNOSIS — M5416 Radiculopathy, lumbar region: Secondary | ICD-10-CM

## 2019-02-23 NOTE — Telephone Encounter (Signed)
Copied from [place] [phone] [phone]. Topic: Quick Communication - Rx Refill/Question >> Feb 23, 2019 12:19 PM Rainey Pines A wrote: Medication: oxyCODONE-acetaminophen (PERCOCET) 10-325 MG tablet   Has the patient contacted their pharmacy? Yes (Agent: If no, request that the patient contact the pharmacy for the refill.) (Agent: If yes, when and what did the pharmacy advise?)Contact PCP  Preferred Pharmacy (with phone number or street name): [id number] [place], [place] [phone] (Phone) [phone] (Fax)    Agent: Please be advised that RX refills may take up to 3 business days. We ask that you follow-up with your pharmacy.

## 2019-02-24 MED ORDER — OXYCODONE-ACETAMINOPHEN 10-325 MG PO TABS: 1.0000 | ORAL_TABLET | Freq: Four times a day (QID) | ORAL | 0 refills | Status: DC | PRN

## 2019-02-24 NOTE — Telephone Encounter (Signed)
Check [place] registry last filled 01/27/2019. MD is out of the office this week. Pls advise../l;mb

## 2019-02-25 NOTE — Telephone Encounter (Signed)
Dr. Quay Burow sent on 02/24/2019. Closing note.

## 2019-03-01 ENCOUNTER — Other Ambulatory Visit: Payer: Self-pay | Admitting: Internal Medicine

## 2019-03-01 DIAGNOSIS — E785 Hyperlipidemia, unspecified: Secondary | ICD-10-CM

## 2019-03-19 DIAGNOSIS — M5416 Radiculopathy, lumbar region: Secondary | ICD-10-CM | POA: Diagnosis not present

## 2019-03-26 ENCOUNTER — Telehealth: Payer: Self-pay

## 2019-03-26 ENCOUNTER — Other Ambulatory Visit: Payer: Self-pay | Admitting: Internal Medicine

## 2019-03-26 DIAGNOSIS — M5416 Radiculopathy, lumbar region: Secondary | ICD-10-CM

## 2019-03-26 DIAGNOSIS — M159 Polyosteoarthritis, unspecified: Secondary | ICD-10-CM

## 2019-03-26 MED ORDER — OXYCODONE-ACETAMINOPHEN 10-325 MG PO TABS: 1.0000 | ORAL_TABLET | Freq: Four times a day (QID) | ORAL | 0 refills | Status: DC | PRN

## 2019-03-26 NOTE — Telephone Encounter (Signed)
Copied from [place] [phone]. Topic: Quick Communication - Rx Refill/Question >> Mar 26, 2019 11:35 AM Carolyn Stare wrote: Medication oxyCODONE-acetaminophen (PERCOCET) 10-325 MG tablet  (Agent: If yes, when and what did the pharmacy advise?)  Preferred Pharmacy [place]  Agent: Please be advised that RX refills may take up to 3 business days. We ask that you follow-up with your pharmacy.

## 2019-04-23 ENCOUNTER — Other Ambulatory Visit: Payer: Self-pay | Admitting: Internal Medicine

## 2019-04-23 DIAGNOSIS — M159 Polyosteoarthritis, unspecified: Secondary | ICD-10-CM

## 2019-04-23 DIAGNOSIS — M5416 Radiculopathy, lumbar region: Secondary | ICD-10-CM

## 2019-04-23 NOTE — Telephone Encounter (Signed)
Medication Refill - Medication: oxyCODONE-acetaminophen (PERCOCET) 10-325 MG tablet  Has the patient contacted their pharmacy? no (Agent: If no, request that the patient contact the pharmacy for the refill.) (Agent: If yes, when and what did the pharmacy advise?)  Preferred Pharmacy (with phone number or street name):  [id number] [place], [place] Phone:  [phone]  Fax:  [phone]     Agent: Please be advised that RX refills may take up to 3 business days. We ask that you follow-up with your pharmacy.

## 2019-04-23 NOTE — Telephone Encounter (Signed)
Requested medication (s) are due for refill today: yes  Requested medication (s) are on the active medication list: yes  Last refill:  03/26/2019  Future visit scheduled: no  Notes to clinic:  refill cannot be delegated    Requested Prescriptions  Pending Prescriptions Disp Refills   oxyCODONE-acetaminophen (PERCOCET) 10-325 MG tablet 100 tablet 0    Sig: Take 1 tablet by mouth every 6 (six) hours as needed for pain.      Not Delegated - Analgesics:  Opioid Agonist Combinations Failed - 04/23/2019 10:17 AM      Failed - This refill cannot be delegated      Failed - Urine Drug Screen completed in last 360 days.      Passed - Valid encounter within last 6 months    Recent Outpatient Visits           4 months ago Benign essential HTN   [place], MD   5 months ago Right hip pain   [place], [place], DO   1 year ago Benign essential HTN   [place], MD   1 year ago Chronic right shoulder pain   [place], [place], DO   1 year ago Thrombocytosis [facility])   [facility] -M[facility]el, MD

## 2019-04-28 NOTE — Telephone Encounter (Signed)
Pt was to follow up in October. Pt will need to schedule an appt for refills.

## 2019-04-29 ENCOUNTER — Ambulatory Visit (INDEPENDENT_AMBULATORY_CARE_PROVIDER_SITE_OTHER): Payer: Medicare Other | Admitting: Internal Medicine

## 2019-04-29 ENCOUNTER — Other Ambulatory Visit: Payer: Self-pay

## 2019-04-29 ENCOUNTER — Other Ambulatory Visit: Payer: Self-pay | Admitting: Internal Medicine

## 2019-04-29 ENCOUNTER — Encounter: Payer: Self-pay | Admitting: Internal Medicine

## 2019-04-29 VITALS — BP 140/80 | HR 65 | Temp 98.1°F | Ht 69.0 in | Wt 203.1 lb

## 2019-04-29 DIAGNOSIS — R7303 Prediabetes: Secondary | ICD-10-CM

## 2019-04-29 DIAGNOSIS — M159 Polyosteoarthritis, unspecified: Secondary | ICD-10-CM | POA: Diagnosis not present

## 2019-04-29 DIAGNOSIS — Z79891 Long term (current) use of opiate analgesic: Secondary | ICD-10-CM

## 2019-04-29 DIAGNOSIS — Z23 Encounter for immunization: Secondary | ICD-10-CM | POA: Diagnosis not present

## 2019-04-29 DIAGNOSIS — I10 Essential (primary) hypertension: Secondary | ICD-10-CM | POA: Diagnosis not present

## 2019-04-29 DIAGNOSIS — M5416 Radiculopathy, lumbar region: Secondary | ICD-10-CM

## 2019-04-29 LAB — BASIC METABOLIC PANEL
BUN: 23 mg/dL (ref 6–23)
CO2: 27 mEq/L (ref 19–32)
Calcium: 10 mg/dL (ref 8.4–10.5)
Chloride: 100 mEq/L (ref 96–112)
Creatinine, Ser: 1.3 mg/dL (ref 0.40–1.50)
GFR: 64.6 mL/min (ref >60.00)
Glucose, Bld: 100 mg/dL — ABNORMAL HIGH (ref 70–99)
Potassium: 3.6 mEq/L (ref 3.5–5.1)
Sodium: 136 mEq/L (ref 135–145)

## 2019-04-29 LAB — HEMOGLOBIN A1C: Hgb A1c MFr Bld: 6 % (ref 4.6–6.5)

## 2019-04-29 MED ORDER — OXYCODONE-ACETAMINOPHEN 10-325 MG PO TABS: 1.0000 | ORAL_TABLET | Freq: Four times a day (QID) | ORAL | 0 refills | Status: DC | PRN

## 2019-04-29 NOTE — Progress Notes (Signed)
Subjective:  Patient ID: Adrian Miller, male    DOB: 02-11-1941  Age: 78 y.o. MRN: [id number]  CC: Hypertension  This visit occurred during the SARS-CoV-2 public health emergency.  Safety protocols were in place, including screening questions prior to the visit, additional usage of staff PPE, and extensive cleaning of exam room while observing appropriate contact time as indicated for disinfecting solutions.    HPI Adrian Miller presents for f/up - He continues to c/o of MSK pain and requests a refill of percocet. He is active and denies any recent episodes of CP, SOB, DOE, palpitations, edema, or fatigue.  Outpatient Medications Prior to Visit  Medication Sig Dispense Refill  . atorvastatin (LIPITOR) 20 MG tablet TAKE 1 TABLET BY MOUTH EVERY DAY 90 tablet 1  . nebivolol (BYSTOLIC) 5 MG tablet Take 1 tablet (5 mg total) by mouth daily. 90 tablet 1  . oxyCODONE-acetaminophen (PERCOCET) 10-325 MG tablet Take 1 tablet by mouth every 6 (six) hours as needed for pain. 100 tablet 0  . chlorthalidone (HYGROTON) 25 MG tablet Take 1 tablet (25 mg total) by mouth daily. 90 tablet 1  . ferrous sulfate 325 (65 FE) MG tablet Take 1 tablet (325 mg total) by mouth 2 (two) times daily with a meal. (Patient not taking: Reported on 04/29/2019) 180 tablet 1  . pantoprazole (PROTONIX) 40 MG tablet Take 1 tablet (40 mg total) by mouth daily. (Patient not taking: Reported on 04/29/2019) 90 tablet 1   No facility-administered medications prior to visit.    ROS Review of Systems  Constitutional: Negative for diaphoresis, fatigue and unexpected weight change.  HENT: Negative.   Eyes: Negative.   Respiratory: Negative for cough, chest tightness, shortness of breath and wheezing.   Cardiovascular: Negative for chest pain, palpitations and leg swelling.  Gastrointestinal: Negative for abdominal pain, constipation, diarrhea, nausea and vomiting.  Endocrine: Negative.   Genitourinary: Negative for difficulty  urinating.  Musculoskeletal: Positive for arthralgias and back pain. Negative for joint swelling and neck pain.  Skin: Negative.  Negative for color change, pallor and rash.  Neurological: Negative.  Negative for dizziness, weakness, light-headedness and headaches.  Hematological: Negative for adenopathy. Does not bruise/bleed easily.  Psychiatric/Behavioral: Negative.     Objective:  BP 140/80 (BP Location: Left Arm, Patient Position: Sitting, Cuff Size: Large)   Pulse 65   Temp 98.1 F (36.7 C) (Oral)   Ht 5\' 9"  (1.753 m)   Wt 203 lb 2 oz (92.1 kg)   SpO2 98%   BMI 30.00 kg/m   BP Readings from Last 3 Encounters:  04/29/19 140/80  12/02/18 (!) 180/88  11/11/18 132/82    Wt Readings from Last 3 Encounters:  04/29/19 203 lb 2 oz (92.1 kg)  12/02/18 198 lb (89.8 kg)  04/22/18 182 lb (82.6 kg)    Physical Exam Vitals reviewed.  Constitutional:      Appearance: Normal appearance.  HENT:     Nose: Nose normal.     Mouth/Throat:     Pharynx: Oropharynx is clear.  Eyes:     General: No scleral icterus.    Conjunctiva/sclera: Conjunctivae normal.  Cardiovascular:     Rate and Rhythm: Normal rate and regular rhythm.     Heart sounds: No murmur.  Pulmonary:     Effort: Pulmonary effort is normal.     Breath sounds: No stridor. No wheezing, rhonchi or rales.  Abdominal:     General: Abdomen is flat. Bowel sounds are normal. There is  no distension.     Palpations: Abdomen is soft. There is no hepatomegaly or splenomegaly.     Tenderness: There is no abdominal tenderness.  Musculoskeletal:        General: Normal range of motion.     Cervical back: Neck supple.     Right lower leg: No edema.     Left lower leg: No edema.  Lymphadenopathy:     Cervical: No cervical adenopathy.  Skin:    General: Skin is warm and dry.  Neurological:     General: No focal deficit present.     Mental Status: He is alert.  Psychiatric:        Mood and Affect: Mood normal.         Behavior: Behavior normal.     Lab Results  Component Value Date   WBC 9.7 12/02/2018   HGB 13.6 12/02/2018   HCT 40.8 12/02/2018   PLT 299.0 12/02/2018   GLUCOSE 100 (H) 04/29/2019   CHOL 88 12/02/2018   TRIG 70.0 12/02/2018   HDL 44.80 12/02/2018   LDLDIRECT 40.0 10/17/2017   LDLCALC 29 12/02/2018   ALT 9 12/02/2018   AST 12 12/02/2018   NA 136 04/29/2019   K 3.6 04/29/2019   CL 100 04/29/2019   CREATININE 1.30 04/29/2019   BUN 23 04/29/2019   CO2 27 04/29/2019   TSH 1.42 11/26/2016   PSA 0.87 03/18/2014   INR 1.02 03/13/2018   HGBA1C 6.0 04/29/2019    CT ABDOMEN PELVIS W CONTRAST  Result Date: 08/20/2018 CLINICAL DATA:  Hernia. Right lower quadrant pain EXAM: CT ABDOMEN AND PELVIS WITH CONTRAST TECHNIQUE: Multidetector CT imaging of the abdomen and pelvis was performed using the standard protocol following bolus administration of intravenous contrast. CONTRAST:  113mL OMNIPAQUE IOHEXOL 300 MG/ML  SOLN COMPARISON:  None. FINDINGS: LOWER CHEST: There is no basilar pleural or apical pericardial effusion. HEPATOBILIARY: The hepatic contours and density are normal. There is no intra- or extrahepatic biliary dilatation. Status post cholecystectomy. PANCREAS: The pancreatic parenchymal contours are normal and there is no ductal dilatation. There is no peripancreatic fluid collection. SPLEEN: Normal. ADRENALS/URINARY TRACT: --Adrenal glands: Normal. --Right kidney/ureter: No hydronephrosis, nephroureterolithiasis, perinephric stranding or solid renal mass. --Left kidney/ureter: There is an upper pole cyst measuring 5.4 cm. No hydronephrosis, nephroureterolithiasis, perinephric stranding or solid renal mass. --Urinary bladder: Normal for degree of distention STOMACH/BOWEL: --Stomach/Duodenum: There is no hiatal hernia or other gastric abnormality. The duodenal course and caliber are normal. --Small bowel: No dilatation or inflammation. --Colon: Rectosigmoid diverticulosis without acute  inflammation. --Appendix: Normal. VASCULAR/LYMPHATIC: There is aortic atherosclerosis without hemodynamically significant stenosis. The portal vein, splenic vein, superior mesenteric vein and IVC are patent. No abdominal or pelvic lymphadenopathy. REPRODUCTIVE: Enlarged prostate measures 5.4 cm in transverse dimension. MUSCULOSKELETAL. Multilevel degenerative disc disease and facet arthrosis. No bony spinal canal stenosis. OTHER: None. IMPRESSION: 1. No acute abnormality of the abdomen or pelvis. 2. Rectosigmoid diverticulosis without acute inflammation. 3. No inguinal or ventral abdominal hernia. Electronically Signed   By: Ulyses Jarred M.D.   On: 08/20/2018 17:59    Assessment & Plan:   [place]k was seen today for hypertension.  Diagnoses and all orders for this visit:  Benign essential HTN- His BP is well controlled. Lytes and renal function are ok. -     Basic metabolic panel  Prediabetes- His A1C is at 6.0%, medical therapy is not indicated. -     Basic metabolic panel -     Hemoglobin A1c  Long-term current use of opiate analgesic- Will check a UDS to monitor for compliance and screen for substance abuse. -     Pain Mgmt, Profile 8 w/Conf, U  Encounter for long-term use of opiate analgesic -     Pain Mgmt, Profile 8 w/Conf, U  Need for influenza vaccination -     Flu Vaccine QUAD High Dose(Fluad)  Generalized OA -     oxyCODONE-acetaminophen (PERCOCET) 10-325 MG tablet; Take 1 tablet by mouth every 6 (six) hours as needed for pain.  Left lumbar radiculopathy -     oxyCODONE-acetaminophen (PERCOCET) 10-325 MG tablet; Take 1 tablet by mouth every 6 (six) hours as needed for pain.   I am having Adrian Miller maintain his pantoprazole, chlorthalidone, nebivolol, ferrous sulfate, atorvastatin, and oxyCODONE-acetaminophen.  Meds ordered this encounter  Medications  . oxyCODONE-acetaminophen (PERCOCET) 10-325 MG tablet    Sig: Take 1 tablet by mouth every 6 (six) hours as needed  for pain.    Dispense:  100 tablet    Refill:  0     Follow-up: Return in about 6 months (around 10/28/2019).  Scarlette Calico, MD

## 2019-04-29 NOTE — Patient Instructions (Signed)

## 2019-04-30 LAB — PAIN MGMT, PROFILE 8 W/CONF, U
6 Acetylmorphine: NEGATIVE ng/mL
Alcohol Metabolites: NEGATIVE ng/mL (ref <500)
Amphetamines: NEGATIVE ng/mL
Benzodiazepines: NEGATIVE ng/mL
Buprenorphine, Urine: NEGATIVE ng/mL
Cocaine Metabolite: NEGATIVE ng/mL
Creatinine: 209.8 mg/dL
MDMA: NEGATIVE ng/mL
Marijuana Metabolite: NEGATIVE ng/mL
Opiates: NEGATIVE ng/mL
Oxidant: NEGATIVE ug/mL
Oxycodone: NEGATIVE ng/mL
pH: 4.9 (ref 4.5–9.0)

## 2019-05-26 ENCOUNTER — Telehealth: Payer: Self-pay | Admitting: Internal Medicine

## 2019-05-26 NOTE — Telephone Encounter (Signed)
Medication Refill - Medication:  oxyCODONE-acetaminophen (PERCOCET) 10-325 MG tablet   Has the patient contacted their pharmacy? No. (Agent: If no, request that the patient contact the pharmacy for the refill.) (Agent: If yes, when and what did the pharmacy advise?)  Preferred Pharmacy (with phone number or street name):  [id number] [place], [place] Phone:  [phone]  Fax:  [phone]      Agent: Please be advised that RX refills may take up to 3 business days. We ask that you follow-up with your pharmacy.

## 2019-05-26 NOTE — Telephone Encounter (Signed)
Finally got in touch with pt about the lab results and whether he had been taking the oxy prior to the appointment. Pt stated that he was out and had been out for several days prior to his appointment on 12.23.2020. Last refill before the December appointment was 03/26/2019.   Please advise on oxy refill request.

## 2019-05-28 ENCOUNTER — Other Ambulatory Visit: Payer: Self-pay | Admitting: Internal Medicine

## 2019-05-28 DIAGNOSIS — M159 Polyosteoarthritis, unspecified: Secondary | ICD-10-CM

## 2019-05-28 DIAGNOSIS — M5416 Radiculopathy, lumbar region: Secondary | ICD-10-CM

## 2019-05-28 MED ORDER — OXYCODONE-ACETAMINOPHEN 10-325 MG PO TABS: 1.0000 | ORAL_TABLET | Freq: Four times a day (QID) | ORAL | 0 refills | Status: DC | PRN

## 2019-06-29 ENCOUNTER — Other Ambulatory Visit: Payer: Self-pay | Admitting: Internal Medicine

## 2019-06-29 ENCOUNTER — Telehealth: Payer: Self-pay | Admitting: Internal Medicine

## 2019-06-29 DIAGNOSIS — M159 Polyosteoarthritis, unspecified: Secondary | ICD-10-CM

## 2019-06-29 DIAGNOSIS — M5416 Radiculopathy, lumbar region: Secondary | ICD-10-CM

## 2019-06-29 MED ORDER — OXYCODONE-ACETAMINOPHEN 10-325 MG PO TABS: 1.0000 | ORAL_TABLET | Freq: Four times a day (QID) | ORAL | 0 refills | Status: DC | PRN

## 2019-06-29 NOTE — Telephone Encounter (Signed)
Medication Requested: oxyCODONE-acetaminophen (PERCOCET) 10-325 MG tablet [id number]   Is medication on med list: yes (if no, inform pt they may need an appointment)  Is medication a controled:yes (yes = last OV with PCP)  -Controlled Substances: Adderall, Ritalin, oxycodone, hydrocodone, methadone, alprazolam, etc  Last visit with PCP:   Is the OV > than 4 months: 04/29/19 (yes = schedule an appt if one is not already made)  Pharmacy (Name, [place]): [id number] [place], [place]

## 2019-07-23 ENCOUNTER — Telehealth: Payer: Self-pay | Admitting: Internal Medicine

## 2019-07-23 NOTE — Telephone Encounter (Signed)
New message:   1.Medication Requested: oxyCODONE-acetaminophen (PERCOCET) 10-325 MG tablet 2. Pharmacy (Name, Street, [place]): [id number] [place], [place] 3. On Med List: yes  4. Last Visit with PCP: 04/29/19  5. Next visit date with PCP: None scheduled   Agent: Please be advised that RX refills may take up to 3 business days. We ask that you follow-up with your pharmacy.

## 2019-07-24 NOTE — Telephone Encounter (Signed)
Per database - rx was last filled on 06/29/2019

## 2019-07-25 ENCOUNTER — Other Ambulatory Visit: Payer: Self-pay | Admitting: Internal Medicine

## 2019-07-25 DIAGNOSIS — M159 Polyosteoarthritis, unspecified: Secondary | ICD-10-CM

## 2019-07-25 DIAGNOSIS — M5416 Radiculopathy, lumbar region: Secondary | ICD-10-CM

## 2019-07-25 MED ORDER — OXYCODONE-ACETAMINOPHEN 10-325 MG PO TABS: 1.0000 | ORAL_TABLET | Freq: Four times a day (QID) | ORAL | 0 refills | Status: DC | PRN

## 2019-08-21 ENCOUNTER — Telehealth: Payer: Self-pay | Admitting: Internal Medicine

## 2019-08-21 NOTE — Telephone Encounter (Signed)
New message:   1.Medication Requested: oxyCODONE-acetaminophen (PERCOCET) 10-325 MG tablet 2. Pharmacy (Name, Street, [place]): [id number] [place], [place] 3. On Med List: yes  4. Last Visit with PCP: 04/29/19  5. Next visit date with PCP: None   Agent: Please be advised that RX refills may take up to 3 business days. We ask that you follow-up with your pharmacy.

## 2019-08-22 ENCOUNTER — Other Ambulatory Visit: Payer: Self-pay | Admitting: Internal Medicine

## 2019-08-22 DIAGNOSIS — M159 Polyosteoarthritis, unspecified: Secondary | ICD-10-CM

## 2019-08-22 DIAGNOSIS — M5416 Radiculopathy, lumbar region: Secondary | ICD-10-CM

## 2019-08-22 MED ORDER — OXYCODONE-ACETAMINOPHEN 10-325 MG PO TABS: 1.0000 | ORAL_TABLET | Freq: Four times a day (QID) | ORAL | 0 refills | Status: DC | PRN

## 2019-08-25 ENCOUNTER — Encounter: Payer: Self-pay | Admitting: Family Medicine

## 2019-08-25 ENCOUNTER — Ambulatory Visit (INDEPENDENT_AMBULATORY_CARE_PROVIDER_SITE_OTHER): Payer: Medicare Other | Admitting: Family Medicine

## 2019-08-25 ENCOUNTER — Ambulatory Visit (INDEPENDENT_AMBULATORY_CARE_PROVIDER_SITE_OTHER): Payer: Medicare Other

## 2019-08-25 ENCOUNTER — Other Ambulatory Visit: Payer: Self-pay

## 2019-08-25 VITALS — BP 122/82 | HR 82 | Ht 69.0 in | Wt 193.0 lb

## 2019-08-25 DIAGNOSIS — G5602 Carpal tunnel syndrome, left upper limb: Secondary | ICD-10-CM | POA: Diagnosis not present

## 2019-08-25 DIAGNOSIS — M1611 Unilateral primary osteoarthritis, right hip: Secondary | ICD-10-CM | POA: Diagnosis not present

## 2019-08-25 DIAGNOSIS — M25532 Pain in left wrist: Secondary | ICD-10-CM

## 2019-08-25 NOTE — Assessment & Plan Note (Signed)
Chronic problem with worsening symptoms.  Discussed with patient about icing regimen and home exercises.  Discussed with patient about the gabapentin again.  Patient wants to avoid this at the moment.  Patient though is having significant thenar eminence wasting and does need surgical intervention likely.  Patient was referred today.  Patient does not want to do surgery the only thing we can do is every 10-week injections but I do not see them to be working long-term.

## 2019-08-25 NOTE — Progress Notes (Signed)
[place] Phone: [phone] Subjective:   [name], am serving as a scribe for Dr. Hulan Saas. This visit occurred during the SARS-CoV-2 public health emergency.  Safety protocols were in place, including screening questions prior to the visit, additional usage of staff PPE, and extensive cleaning of exam room while observing appropriate contact time as indicated for disinfecting solutions.   I'm seeing this patient by the request  of:  Janith Lima, MD  CC: Hand pain, hip pain  [id number]   11/11/2018 Repeat injection given today.  Tolerated procedure well.  Discussed icing regimen and home exercise.  Discussed topical anti-inflammatories.  Follow-up again in 4 to 8 weeks   Patient's has not responded well to gabapentin in the past.Patient given injection and tolerated the procedure well.  Discussed icing regimen and home exercise.  Discussed which activities of doing which wants to avoid. Has not responded well to gabapentin in the past  Bracing  RTC in 4-6 weeks   Update 08/25/2019 Adrian Miller is a 79 y.o. male coming in with complaint of right hip and left wrist pain. Patient states that he would like injections in the left wrist and right hip. Having increasing pain in each area.  Patient has known arthritic changes of the right hip.  Affecting daily activities, walking with the aid of a cane.  Still does not want any type of surgical intervention if he can help it.  States that it is affecting daily activities.  Left hand seems to get weaker and has constant numbness now.  Feels like it is almost worthless.      Past Medical History:  Diagnosis Date  . Acute esophagitis 06/01/2015  . Benign essential HTN 05/31/2015  . Duodenal ulcer hemorrhage   . Esophageal stricture 06/01/2015  . Hiatal hernia 06/01/2015   Past Surgical History:  Procedure Laterality Date  . CHOLECYSTECTOMY N/A 09/30/2017     Procedure: LAPAROSCOPIC CHOLECYSTECTOMY WITH INTRAOPERATIVE CHOLANGIOGRAM;  Surgeon: Excell Seltzer, MD;  Location: [facility];  Service: General;  Laterality: N/A;  . ESOPHAGOGASTRODUODENOSCOPY N/A 05/31/2015   Procedure: ESOPHAGOGASTRODUODENOSCOPY (EGD);  Surgeon: Ladene Artist, MD;  Location: [facility];  Service: Endoscopy;  Laterality: N/A;  . HERNIA REPAIR  1975  . LEFT ROTATOR CUFF REPAIR  2002  . REPLACEMENT TOTAL KNEE Left   . REVERSE SHOULDER ARTHROPLASTY Right 03/20/2018   Procedure: RIGHT REVERSE SHOULDER ARTHROPLASTY;  Surgeon: Tania Ade, MD;  Location: [facility];  Service: Orthopedics;  Laterality: Right;  . TOTAL KNEE ARTHROPLASTY Right 02/22/2015   Procedure: TOTAL KNEE ARTHROPLASTY;  Surgeon: Melrose Nakayama, MD;  Location: [facility];  Service: Orthopedics;  Laterality: Right;   Social History   Socioeconomic History  . Marital status: Single    Spouse name: Not on file  . Number of children: Not on file  . Years of education: Not on file  . Highest education level: Not on file  Occupational History  . Not on file  Tobacco Use  . Smoking status: Current Every Day Smoker    Packs/day: 0.50    Years: 51.00    Pack years: 25.50    Types: Cigarettes  . Smokeless tobacco: Former Systems developer    Quit date: 11/03/2014  Substance and Sexual Activity  . Alcohol use: No    Alcohol/week: 0.0 standard drinks  . Drug use: No  . Sexual activity: Never  Other Topics Concern  . Not on file  Social History Narrative  . Not on file   Social Determinants of Health   Financial Resource Strain:   . Difficulty of Paying Living Expenses:   Food Insecurity:   . Worried About Charity fundraiser in the Last Year:   . Arboriculturist in the Last Year:   Transportation Needs:   . Film/video editor (Medical):   [name] Lack of Transportation (Non-Medical):   Physical Activity:   . Days of Exercise per Week:   . Minutes of Exercise per Session:   Stress:   . Feeling of Stress :    Social Connections:   . Frequency of Communication with Friends and Family:   . Frequency of Social Gatherings with Friends and Family:   . Attends Religious Services:   . Active Member of Clubs or Organizations:   . Attends Archivist Meetings:   [name] Marital Status:    Allergies  Allergen Reactions  . Asa [Aspirin] Other (See Comments)    Angioedema- patient is unaware of this (??)   Family History  Problem Relation Age of Onset  . Hypertension Mother   . Heart attack Sister      Current Outpatient Medications (Cardiovascular):  .  atorvastatin (LIPITOR) 20 MG tablet, TAKE 1 TABLET BY MOUTH EVERY DAY .  chlorthalidone (HYGROTON) 25 MG tablet, TAKE 1 TABLET BY MOUTH EVERY DAY .  nebivolol (BYSTOLIC) 5 MG tablet, Take 1 tablet (5 mg total) by mouth daily.   Current Outpatient Medications (Analgesics):  .  oxyCODONE-acetaminophen (PERCOCET) 10-325 MG tablet, Take 1 tablet by mouth every 6 (six) hours as needed for pain.  Current Outpatient Medications (Hematological):  .  ferrous sulfate 325 (65 FE) MG tablet, Take 1 tablet (325 mg total) by mouth 2 (two) times daily with a meal.  Current Outpatient Medications (Other):  .  pantoprazole (PROTONIX) 40 MG tablet, Take 1 tablet (40 mg total) by mouth daily.   Reviewed prior external information including notes and imaging from  primary care provider As well as notes that were available from care everywhere and other healthcare systems.  Past medical history, social, surgical and family history all reviewed in electronic medical record.  No pertanent information unless stated regarding to the chief complaint.   Review of Systems:  No headache, visual changes, nausea, vomiting, diarrhea, constipation, dizziness, abdominal pain, skin rash, fevers, chills, night sweats, weight loss, swollen lymph nodes,  joint swelling, chest pain, shortness of breath, mood changes. POSITIVE muscle aches, body aches  Objective  Blood  pressure 122/82, pulse 82, height 5\' 9"  (1.753 m), weight 193 lb (87.5 kg), SpO2 99 %.   General: No apparent distress alert and oriented x3 mood and affect normal, dressed appropriately.  HEENT: Pupils equal, extraocular movements intact  Respiratory: Patient's speak in full sentences and does not appear short of breath  Cardiovascular: Trace lower extremity edema, non tender, no erythema  Neuro: Cranial nerves II through XII are intact, neurovascularly intact in all extremities with 2+ DTRs and 2+ pulses.  Gait  antalgic favoring patient's right hip hip is very minimal internal and external range of motion.  Mild weakness with even straight leg test noted.  Mild atrophy of the thigh compared to the contralateral side.  Left hand has significant wasting of the thenar eminence at this time.  Significantly more than previous exam.  Weakness noted.  Positive Tinel's sign noted.  Procedure: Real-time Ultrasound Guided Injection of right hip Device: GE Logiq Q7  Ultrasound guided injection is preferred based studies that show increased duration, increased effect, greater accuracy, decreased procedural pain, increased response rate with ultrasound guided versus blind injection.  Verbal informed consent obtained.  Time-out conducted.  Noted no overlying erythema, induration, or other signs of local infection.  Skin prepped in a sterile fashion.  Local anesthesia: Topical Ethyl chloride.  With sterile technique and under real time ultrasound guidance:  Anterior capsule visualized, needle visualized going to the head neck junction at the anterior capsule. Pictures taken. Patient did have injection of  3 cc of 0.5% Marcaine, and 1 cc of Kenalog 40 mg/dL. Completed without difficulty  Pain immediately resolved suggesting accurate placement of the medication.  Advised to call if fevers/chills, erythema, induration, drainage, or persistent bleeding.  Images permanently stored and available for review in  the ultrasound unit.  Impression: Technically successful ultrasound guided injection.  Procedure: Real-time Ultrasound Guided Injection of left carpal tunnel Device: GE Logiq Q7 Ultrasound guided injection is preferred based studies that show increased duration, increased effect, greater accuracy, decreased procedural pain, increased response rate with ultrasound guided versus blind injection.  Verbal informed consent obtained.  Time-out conducted.  Noted no overlying erythema, induration, or other signs of local infection.  Skin prepped in a sterile fashion.  Local anesthesia: Topical Ethyl chloride.  With sterile technique and under real time ultrasound guidance:  median nerve visualized.  23g 5/8 inch needle inserted distal to proximal approach into nerve sheath. Pictures taken nfor needle placement. Patient did have injection of 2 cc of 0.5% Marcaine, and 1 cc of Kenalog 40 mg/dL. Completed without difficulty  Pain immediately resolved suggesting accurate placement of the medication.  Advised to call if fevers/chills, erythema, induration, drainage, or persistent bleeding.  Images permanently stored and available for review in the ultrasound unit.  Impression: Technically successful ultrasound guided injection.  Impression and Recommendations:     This case required medical decision making of moderate complexity. The above documentation has been reviewed and is accurate and complete Lyndal Pulley, DO       Note: This dictation was prepared with Dragon dictation along with smaller phrase technology. Any transcriptional errors that result from this process are unintentional.

## 2019-08-25 NOTE — Patient Instructions (Signed)
Hand surgeon will call you Otherwise see me in 10 weeks

## 2019-08-25 NOTE — Assessment & Plan Note (Signed)
Repeat injection given today, tolerated the procedure well, discussed icing regimen and home exercise, which activities to do which wants to avoid.  This is a chronic problem with worsening symptoms.  Patient still wants to avoid any surgical intervention patient states that because of his other comorbidities this would be difficult anyhow.  Hopefully patient responds fairly well.  Follow-up again in 10 weeks

## 2019-09-22 ENCOUNTER — Telehealth: Payer: Self-pay

## 2019-09-22 NOTE — Telephone Encounter (Signed)
1.Medication Requested:oxyCODONE-acetaminophen (PERCOCET) 10-325 MG tablet  2. Pharmacy (Name, Street, City):[id number] - [place], [place] - [address] [place]  3. On Med List: Yes   4. Last Visit with PCP: 12.23.20  5. Next visit date with PCP: n/a   Agent: Please be advised that RX refills may take up to 3 business days. We ask that you follow-up with your pharmacy.

## 2019-09-22 NOTE — Telephone Encounter (Signed)
Oxycodone last refilled on 08/22/2019. Last visit with PCP was 04/28/2020. Pt due for follow up in June, appt has not scheduled OV yet.   Please advise.

## 2019-09-23 ENCOUNTER — Other Ambulatory Visit: Payer: Self-pay | Admitting: Internal Medicine

## 2019-09-23 DIAGNOSIS — M159 Polyosteoarthritis, unspecified: Secondary | ICD-10-CM

## 2019-09-23 DIAGNOSIS — M5416 Radiculopathy, lumbar region: Secondary | ICD-10-CM

## 2019-09-23 MED ORDER — OXYCODONE-ACETAMINOPHEN 10-325 MG PO TABS: 1.0000 | ORAL_TABLET | Freq: Four times a day (QID) | ORAL | 0 refills | Status: DC | PRN

## 2019-10-22 ENCOUNTER — Other Ambulatory Visit: Payer: Self-pay

## 2019-10-22 ENCOUNTER — Encounter: Payer: Self-pay | Admitting: Internal Medicine

## 2019-10-22 ENCOUNTER — Other Ambulatory Visit: Payer: Self-pay | Admitting: Internal Medicine

## 2019-10-22 ENCOUNTER — Ambulatory Visit (INDEPENDENT_AMBULATORY_CARE_PROVIDER_SITE_OTHER): Payer: Medicare Other | Admitting: Internal Medicine

## 2019-10-22 VITALS — BP 130/70 | HR 95 | Temp 98.3°F | Ht 69.0 in | Wt 194.2 lb

## 2019-10-22 DIAGNOSIS — D510 Vitamin B12 deficiency anemia due to intrinsic factor deficiency: Secondary | ICD-10-CM | POA: Diagnosis not present

## 2019-10-22 DIAGNOSIS — K264 Chronic or unspecified duodenal ulcer with hemorrhage: Secondary | ICD-10-CM

## 2019-10-22 DIAGNOSIS — I739 Peripheral vascular disease, unspecified: Secondary | ICD-10-CM | POA: Diagnosis not present

## 2019-10-22 DIAGNOSIS — K4021 Bilateral inguinal hernia, without obstruction or gangrene, recurrent: Secondary | ICD-10-CM | POA: Insufficient documentation

## 2019-10-22 DIAGNOSIS — N289 Disorder of kidney and ureter, unspecified: Secondary | ICD-10-CM | POA: Diagnosis not present

## 2019-10-22 DIAGNOSIS — I10 Essential (primary) hypertension: Secondary | ICD-10-CM | POA: Diagnosis not present

## 2019-10-22 DIAGNOSIS — Z0000 Encounter for general adult medical examination without abnormal findings: Secondary | ICD-10-CM

## 2019-10-22 DIAGNOSIS — M159 Polyosteoarthritis, unspecified: Secondary | ICD-10-CM

## 2019-10-22 DIAGNOSIS — R7303 Prediabetes: Secondary | ICD-10-CM

## 2019-10-22 DIAGNOSIS — E785 Hyperlipidemia, unspecified: Secondary | ICD-10-CM | POA: Diagnosis not present

## 2019-10-22 DIAGNOSIS — K222 Esophageal obstruction: Secondary | ICD-10-CM

## 2019-10-22 DIAGNOSIS — M5416 Radiculopathy, lumbar region: Secondary | ICD-10-CM

## 2019-10-22 DIAGNOSIS — D508 Other iron deficiency anemias: Secondary | ICD-10-CM

## 2019-10-22 LAB — HEMOGLOBIN A1C: Hgb A1c MFr Bld: 6.1 % (ref 4.6–6.5)

## 2019-10-22 LAB — BASIC METABOLIC PANEL
BUN: 20 mg/dL (ref 6–23)
CO2: 28 mEq/L (ref 19–32)
Calcium: 9.4 mg/dL (ref 8.4–10.5)
Chloride: 99 mEq/L (ref 96–112)
Creatinine, Ser: 1.23 mg/dL (ref 0.40–1.50)
GFR: 68.78 mL/min (ref >60.00)
Glucose, Bld: 99 mg/dL (ref 70–99)
Potassium: 3.8 mEq/L (ref 3.5–5.1)
Sodium: 135 mEq/L (ref 135–145)

## 2019-10-22 LAB — LDL CHOLESTEROL, DIRECT: Direct LDL: 47 mg/dL

## 2019-10-22 LAB — CBC WITH DIFFERENTIAL/PLATELET
Basophils Absolute: 0.1 10*3/uL (ref 0.0–0.1)
Basophils Relative: 1.1 % (ref 0.0–3.0)
Eosinophils Absolute: 0.4 10*3/uL (ref 0.0–0.7)
Eosinophils Relative: 4.3 % (ref 0.0–5.0)
HCT: 41.4 % (ref 39.0–52.0)
Hemoglobin: 14.1 g/dL (ref 13.0–17.0)
Lymphocytes Relative: 27.7 % (ref 12.0–46.0)
Lymphs Abs: 2.8 10*3/uL (ref 0.7–4.0)
MCHC: 34.2 g/dL (ref 30.0–36.0)
MCV: 94.4 fl (ref 78.0–100.0)
Monocytes Absolute: 0.9 10*3/uL (ref 0.1–1.0)
Monocytes Relative: 9.2 % (ref 3.0–12.0)
Neutro Abs: 5.8 10*3/uL (ref 1.4–7.7)
Neutrophils Relative %: 57.7 % (ref 43.0–77.0)
Platelets: 360 10*3/uL (ref 150.0–400.0)
RBC: 4.39 Mil/uL (ref 4.22–5.81)
RDW: 14.4 % (ref 11.5–15.5)
WBC: 10 10*3/uL (ref 4.0–10.5)

## 2019-10-22 LAB — HEPATIC FUNCTION PANEL
ALT: 17 U/L (ref 0–53)
AST: 20 U/L (ref 0–37)
Albumin: 4.2 g/dL (ref 3.5–5.2)
Alkaline Phosphatase: 101 U/L (ref 39–117)
Bilirubin, Direct: 0.1 mg/dL (ref 0.0–0.3)
Total Bilirubin: 0.3 mg/dL (ref 0.2–1.2)
Total Protein: 8 g/dL (ref 6.0–8.3)

## 2019-10-22 LAB — LIPID PANEL
Cholesterol: 162 mg/dL (ref 0–200)
HDL: 29 mg/dL — ABNORMAL LOW (ref >39.00)
NonHDL: 132.59
Total CHOL/HDL Ratio: 6
Triglycerides: 301 mg/dL — ABNORMAL HIGH (ref 0.0–149.0)
VLDL: 60.2 mg/dL — ABNORMAL HIGH (ref 0.0–40.0)

## 2019-10-22 LAB — FOLATE: Folate: 6.9 ng/mL (ref >5.9)

## 2019-10-22 LAB — IBC PANEL
Iron: 68 ug/dL (ref 42–165)
Saturation Ratios: 20.8 % (ref 20.0–50.0)
Transferrin: 234 mg/dL (ref 212.0–360.0)

## 2019-10-22 LAB — FERRITIN: Ferritin: 50.1 ng/mL (ref 22.0–322.0)

## 2019-10-22 LAB — TSH: TSH: 3.56 u[IU]/mL (ref 0.35–4.50)

## 2019-10-22 NOTE — Progress Notes (Signed)
Subjective:  Patient ID: Adrian Miller, male    DOB: 20-Aug-1940  Age: 79 y.o. MRN: [id number]  CC: Back Pain, Osteoarthritis, Annual Exam, and Anemia  This visit occurred during the SARS-CoV-2 public health emergency.  Safety protocols were in place, including screening questions prior to the visit, additional usage of staff PPE, and extensive cleaning of exam room while observing appropriate contact time as indicated for disinfecting solutions.    HPI Adrian Miller presents for a CPX.  He has noticed that he has developed hernias in his groin.  It is more noticeable on the right than the left.  He is wearing a truss which has helped quite a bit.  He denies abdominal pain, nausea, vomiting, diarrhea, or constipation.  He continues to complain of musculoskeletal pain.  He is not a candidate for surgery.  He requests a refill of oxycodone/acetaminophen.  He is active and denies any recent episodes of chest pain, shortness of breath, diaphoresis, dizziness, lightheadedness, edema, or fatigue.  He tells me his blood pressure has been well controlled.  Outpatient Medications Prior to Visit  Medication Sig Dispense Refill  . atorvastatin (LIPITOR) 20 MG tablet TAKE 1 TABLET BY MOUTH EVERY DAY 90 tablet 1  . ferrous sulfate 325 (65 FE) MG tablet Take 1 tablet (325 mg total) by mouth 2 (two) times daily with a meal. 180 tablet 1  . chlorthalidone (HYGROTON) 25 MG tablet TAKE 1 TABLET BY MOUTH EVERY DAY 90 tablet 1  . nebivolol (BYSTOLIC) 5 MG tablet Take 1 tablet (5 mg total) by mouth daily. 90 tablet 1  . oxyCODONE-acetaminophen (PERCOCET) 10-325 MG tablet Take 1 tablet by mouth every 6 (six) hours as needed for pain. 100 tablet 0  . pantoprazole (PROTONIX) 40 MG tablet Take 1 tablet (40 mg total) by mouth daily. 90 tablet 1   No facility-administered medications prior to visit.    ROS Review of Systems  Constitutional: Negative.  Negative for appetite change, diaphoresis, fatigue and unexpected  weight change.  HENT: Negative.  Negative for trouble swallowing.   Eyes: Negative.   Respiratory: Negative for cough, chest tightness, shortness of breath and wheezing.   Cardiovascular: Negative for chest pain, palpitations and leg swelling.  Gastrointestinal: Negative for abdominal pain, constipation, diarrhea, nausea and vomiting.  Endocrine: Negative.   Genitourinary: Negative.  Negative for difficulty urinating, dysuria and hematuria.  Musculoskeletal: Positive for arthralgias and back pain. Negative for myalgias and neck pain.  Skin: Negative.  Negative for color change, pallor and rash.  Neurological: Negative.  Negative for dizziness, weakness, light-headedness, numbness and headaches.  Hematological: Negative for adenopathy. Does not bruise/bleed easily.  Psychiatric/Behavioral: Negative.     Objective:  BP 130/70 (BP Location: Left Arm, Patient Position: Sitting, Cuff Size: Normal)   Pulse 95   Temp 98.3 F (36.8 C) (Oral)   Ht 5\' 9"  (1.753 m)   Wt 194 lb 4 oz (88.1 kg)   SpO2 95%   BMI 28.69 kg/m   BP Readings from Last 3 Encounters:  10/22/19 130/70  08/25/19 122/82  04/29/19 140/80    Wt Readings from Last 3 Encounters:  10/22/19 194 lb 4 oz (88.1 kg)  08/25/19 193 lb (87.5 kg)  04/29/19 203 lb 2 oz (92.1 kg)    Physical Exam Vitals reviewed. Exam conducted with a chaperone present Everette Rank).  Constitutional:      Appearance: Normal appearance.  HENT:     Nose: Nose normal.     Mouth/Throat:  Mouth: Mucous membranes are moist.  Eyes:     General: No scleral icterus.    Conjunctiva/sclera: Conjunctivae normal.  Cardiovascular:     Rate and Rhythm: Normal rate and regular rhythm.     Heart sounds: No murmur heard.   Pulmonary:     Effort: Pulmonary effort is normal.     Breath sounds: No stridor. No wheezing, rhonchi or rales.  Abdominal:     General: Abdomen is flat. There is no distension.     Palpations: There is no mass.      Tenderness: There is no guarding.     Hernia: A hernia is present. Hernia is present in the left inguinal area and right inguinal area.  Genitourinary:    Pubic Area: No rash.      Penis: Normal.      Comments: Bilateral reducible, direct inguinal hernias. Musculoskeletal:        General: Normal range of motion.     Cervical back: Neck supple.     Right lower leg: No edema.     Left lower leg: No edema.  Lymphadenopathy:     Cervical: No cervical adenopathy.     Lower Body: No right inguinal adenopathy. No left inguinal adenopathy.  Skin:    General: Skin is warm and dry.     Coloration: Skin is not pale.  Neurological:     General: No focal deficit present.     Mental Status: He is alert and oriented to person, place, and time. Mental status is at baseline.  Psychiatric:        Mood and Affect: Mood normal.        Behavior: Behavior normal.     Lab Results  Component Value Date   WBC 10.0 10/22/2019   HGB 14.1 10/22/2019   HCT 41.4 10/22/2019   PLT 360.0 10/22/2019   GLUCOSE 99 10/22/2019   CHOL 162 10/22/2019   TRIG 301.0 (H) 10/22/2019   HDL 29.00 (L) 10/22/2019   LDLDIRECT 47.0 10/22/2019   LDLCALC 29 12/02/2018   ALT 17 10/22/2019   AST 20 10/22/2019   NA 135 10/22/2019   K 3.8 10/22/2019   CL 99 10/22/2019   CREATININE 1.23 10/22/2019   BUN 20 10/22/2019   CO2 28 10/22/2019   TSH 3.56 10/22/2019   PSA 0.87 03/18/2014   INR 1.02 03/13/2018   HGBA1C 6.1 10/22/2019    CT ABDOMEN PELVIS W CONTRAST  Result Date: 08/20/2018 CLINICAL DATA:  Hernia. Right lower quadrant pain EXAM: CT ABDOMEN AND PELVIS WITH CONTRAST TECHNIQUE: Multidetector CT imaging of the abdomen and pelvis was performed using the standard protocol following bolus administration of intravenous contrast. CONTRAST:  139mL OMNIPAQUE IOHEXOL 300 MG/ML  SOLN COMPARISON:  None. FINDINGS: LOWER CHEST: There is no basilar pleural or apical pericardial effusion. HEPATOBILIARY: The hepatic contours and  density are normal. There is no intra- or extrahepatic biliary dilatation. Status post cholecystectomy. PANCREAS: The pancreatic parenchymal contours are normal and there is no ductal dilatation. There is no peripancreatic fluid collection. SPLEEN: Normal. ADRENALS/URINARY TRACT: --Adrenal glands: Normal. --Right kidney/ureter: No hydronephrosis, nephroureterolithiasis, perinephric stranding or solid renal mass. --Left kidney/ureter: There is an upper pole cyst measuring 5.4 cm. No hydronephrosis, nephroureterolithiasis, perinephric stranding or solid renal mass. --Urinary bladder: Normal for degree of distention STOMACH/BOWEL: --Stomach/Duodenum: There is no hiatal hernia or other gastric abnormality. The duodenal course and caliber are normal. --Small bowel: No dilatation or inflammation. --Colon: Rectosigmoid diverticulosis without acute inflammation. --Appendix: Normal. VASCULAR/LYMPHATIC:  There is aortic atherosclerosis without hemodynamically significant stenosis. The portal vein, splenic vein, superior mesenteric vein and IVC are patent. No abdominal or pelvic lymphadenopathy. REPRODUCTIVE: Enlarged prostate measures 5.4 cm in transverse dimension. MUSCULOSKELETAL. Multilevel degenerative disc disease and facet arthrosis. No bony spinal canal stenosis. OTHER: None. IMPRESSION: 1. No acute abnormality of the abdomen or pelvis. 2. Rectosigmoid diverticulosis without acute inflammation. 3. No inguinal or ventral abdominal hernia. Electronically Signed   By: Ulyses Jarred M.D.   On: 08/20/2018 17:59    Assessment & Plan:   Chadwin was seen today for back pain, osteoarthritis, annual exam and anemia.  Diagnoses and all orders for this visit:  Benign essential HTN- His BP is well controlled. -     CBC with Differential/Platelet; Future -     TSH; Future -     TSH -     CBC with Differential/Platelet  PAD (peripheral artery disease) ([facility])- He denies claudication.  Will continue to address risk factor  modifications. -     Lipid panel; Future -     Lipid panel  Esophageal stricture- He is no longer taking a PPI and is doing well.  Duodenal ulcer hemorrhage  Renal insufficiency- His GFR is 54.  He will avoid nephrotoxic agents. -     Basic metabolic panel; Future -     Basic metabolic panel  Vitamin P82 deficiency anemia due to intrinsic factor deficiency- His H&H are normal.  Will continue parenteral B12 replacement therapy. -     CBC with Differential/Platelet; Future -     Folate; Future -     Folate -     CBC with Differential/Platelet  Prediabetes- His A1c is at 6.1%.  Treatment with a glycemic agent is not indicated. -     Hemoglobin A1c; Future -     Basic metabolic panel; Future -     Basic metabolic panel -     Hemoglobin A1c  Hyperlipidemia with target LDL less than 130- He has achieved his LDL goal and is doing well on the statin. -     Lipid panel; Future -     Hepatic function panel; Future -     TSH; Future -     TSH -     Hepatic function panel -     Lipid panel  Other iron deficiency anemias- His H&H are normal now. -     IBC panel; Future -     Ferritin; Future -     Ferritin -     IBC panel  Bilateral recurrent inguinal hernia without obstruction or gangrene -     Ambulatory referral to General Surgery  Generalized OA -     oxyCODONE-acetaminophen (PERCOCET) 10-325 MG tablet; Take 1 tablet by mouth every 6 (six) hours as needed for pain.  Left lumbar radiculopathy -     oxyCODONE-acetaminophen (PERCOCET) 10-325 MG tablet; Take 1 tablet by mouth every 6 (six) hours as needed for pain.  Other orders -     LDL cholesterol, direct   I have discontinued Shanon Brow L. Laing's pantoprazole and nebivolol. I am also having him maintain his ferrous sulfate, atorvastatin, and oxyCODONE-acetaminophen.  Meds ordered this encounter  Medications  . oxyCODONE-acetaminophen (PERCOCET) 10-325 MG tablet    Sig: Take 1 tablet by mouth every 6 (six) hours as needed  for pain.    Dispense:  100 tablet    Refill:  0   In addition to time spent on CPE, I spent 50  minutes in preparing to see the patient by review of recent labs, imaging and procedures, obtaining and reviewing separately obtained history, communicating with the patient and family or caregiver, ordering medications, tests or procedures, and documenting clinical information in the EHR including the differential Dx, treatment, and any further evaluation and other management of 1. Benign essential HTN 2. PAD (peripheral artery disease) ([facility]) 3. Esophageal stricture 4. Renal insufficiency 5. Vitamin B12 deficiency anemia due to intrinsic factor deficiency 6. Prediabetes 7. Hyperlipidemia with target LDL less than 130 8. Other iron deficiency anemias 9. Bilateral recurrent inguinal hernia without obstruction or gangrene 10. Generalized OA 11. Left lumbar radiculopathy     Follow-up: Return in about 3 months (around 01/22/2020).  Scarlette Calico, MD

## 2019-10-22 NOTE — Patient Instructions (Signed)

## 2019-10-23 ENCOUNTER — Telehealth: Payer: Self-pay | Admitting: Internal Medicine

## 2019-10-23 MED ORDER — OXYCODONE-ACETAMINOPHEN 10-325 MG PO TABS: 1.0000 | ORAL_TABLET | Freq: Four times a day (QID) | ORAL | 0 refills | Status: DC | PRN

## 2019-10-23 NOTE — Telephone Encounter (Signed)
Oxy was last filled on 10/24/2019.

## 2019-10-23 NOTE — Telephone Encounter (Signed)
Patient is requesting a refill on the following medication : oxyCODONE-acetaminophen (PERCOCET) 10-325 MG tablet  LOV : 10/22/19  Pharmacy on file.

## 2019-10-25 ENCOUNTER — Other Ambulatory Visit: Payer: Self-pay | Admitting: Internal Medicine

## 2019-10-25 DIAGNOSIS — Z0000 Encounter for general adult medical examination without abnormal findings: Secondary | ICD-10-CM | POA: Insufficient documentation

## 2019-10-25 NOTE — Assessment & Plan Note (Signed)
Exam completed Labs reviewed Vaccines reviewed and updated No cancer screenings are indicated Patient education was given 

## 2019-11-15 ENCOUNTER — Other Ambulatory Visit: Payer: Self-pay

## 2019-11-15 ENCOUNTER — Emergency Department (HOSPITAL_COMMUNITY)
Admission: EM | Admit: 2019-11-15 | Discharge: 2019-11-16 | Disposition: A | Discharge status: Home / Self Care | Payer: Medicare Other | Attending: Emergency Medicine | Admitting: Emergency Medicine

## 2019-11-15 ENCOUNTER — Encounter (HOSPITAL_COMMUNITY): Payer: Self-pay | Admitting: Emergency Medicine

## 2019-11-15 DIAGNOSIS — K5730 Diverticulosis of large intestine without perforation or abscess without bleeding: Secondary | ICD-10-CM | POA: Diagnosis not present

## 2019-11-15 DIAGNOSIS — R1031 Right lower quadrant pain: Secondary | ICD-10-CM

## 2019-11-15 DIAGNOSIS — K838 Other specified diseases of biliary tract: Secondary | ICD-10-CM | POA: Diagnosis not present

## 2019-11-15 DIAGNOSIS — I700 Atherosclerosis of aorta: Secondary | ICD-10-CM | POA: Diagnosis not present

## 2019-11-15 DIAGNOSIS — Z79899 Other long term (current) drug therapy: Secondary | ICD-10-CM | POA: Insufficient documentation

## 2019-11-15 DIAGNOSIS — M6289 Other specified disorders of muscle: Secondary | ICD-10-CM | POA: Diagnosis not present

## 2019-11-15 DIAGNOSIS — F17210 Nicotine dependence, cigarettes, uncomplicated: Secondary | ICD-10-CM | POA: Diagnosis not present

## 2019-11-15 DIAGNOSIS — I10 Essential (primary) hypertension: Secondary | ICD-10-CM | POA: Insufficient documentation

## 2019-11-15 LAB — COMPREHENSIVE METABOLIC PANEL
ALT: 35 U/L (ref 0–44)
AST: 24 U/L (ref 15–41)
Albumin: 3.8 g/dL (ref 3.5–5.0)
Alkaline Phosphatase: 92 U/L (ref 38–126)
Anion gap: 11 (ref 5–15)
BUN: 16 mg/dL (ref 8–23)
CO2: 27 mmol/L (ref 22–32)
Calcium: 9.6 mg/dL (ref 8.9–10.3)
Chloride: 100 mmol/L (ref 98–111)
Creatinine, Ser: 1.6 mg/dL — ABNORMAL HIGH (ref 0.61–1.24)
GFR calc Af Amer: 47 mL/min — ABNORMAL LOW (ref >60)
GFR calc non Af Amer: 41 mL/min — ABNORMAL LOW (ref >60)
Glucose, Bld: 106 mg/dL — ABNORMAL HIGH (ref 70–99)
Potassium: 3.5 mmol/L (ref 3.5–5.1)
Sodium: 138 mmol/L (ref 135–145)
Total Bilirubin: 0.7 mg/dL (ref 0.3–1.2)
Total Protein: 8.3 g/dL — ABNORMAL HIGH (ref 6.5–8.1)

## 2019-11-15 LAB — CBC
HCT: 44.2 % (ref 39.0–52.0)
Hemoglobin: 14.9 g/dL (ref 13.0–17.0)
MCH: 30.9 pg (ref 26.0–34.0)
MCHC: 33.7 g/dL (ref 30.0–36.0)
MCV: 91.7 fL (ref 80.0–100.0)
Platelets: 397 10*3/uL (ref 150–400)
RBC: 4.82 MIL/uL (ref 4.22–5.81)
RDW: 14.2 % (ref 11.5–15.5)
WBC: 10 10*3/uL (ref 4.0–10.5)
nRBC: 0 % (ref 0.0–0.2)

## 2019-11-15 LAB — LIPASE, BLOOD: Lipase: 36 U/L (ref 11–51)

## 2019-11-15 MED ORDER — SODIUM CHLORIDE 0.9% FLUSH: 3.0000 mL | Freq: Once | INTRAVENOUS | Status: DC

## 2019-11-15 NOTE — ED Triage Notes (Signed)
Pt c/o RLQ pain and occasional nausea/vomiting. Unclear how long symptoms have been present, pt had his gallbladder removed in 2019. Pt also c/o chronic back pain, reports he gets steroid injections regularly.

## 2019-11-16 ENCOUNTER — Emergency Department (HOSPITAL_COMMUNITY): Payer: Medicare Other

## 2019-11-16 DIAGNOSIS — I700 Atherosclerosis of aorta: Secondary | ICD-10-CM | POA: Diagnosis not present

## 2019-11-16 DIAGNOSIS — K838 Other specified diseases of biliary tract: Secondary | ICD-10-CM | POA: Diagnosis not present

## 2019-11-16 DIAGNOSIS — K5730 Diverticulosis of large intestine without perforation or abscess without bleeding: Secondary | ICD-10-CM | POA: Diagnosis not present

## 2019-11-16 DIAGNOSIS — R1031 Right lower quadrant pain: Secondary | ICD-10-CM | POA: Diagnosis not present

## 2019-11-16 DIAGNOSIS — M6289 Other specified disorders of muscle: Secondary | ICD-10-CM | POA: Diagnosis not present

## 2019-11-16 LAB — URINALYSIS, ROUTINE W REFLEX MICROSCOPIC
Bilirubin Urine: NEGATIVE
Glucose, UA: NEGATIVE mg/dL
Hgb urine dipstick: NEGATIVE
Ketones, ur: NEGATIVE mg/dL
Leukocytes,Ua: NEGATIVE
Nitrite: NEGATIVE
Protein, ur: NEGATIVE mg/dL
Specific Gravity, Urine: 1.044 — ABNORMAL HIGH (ref 1.005–1.030)
pH: 5 (ref 5.0–8.0)

## 2019-11-16 MED ORDER — IOHEXOL 300 MG/ML  SOLN
100.0000 mL | Freq: Once | INTRAMUSCULAR | Status: AC | PRN
  Administered 2019-11-16: 100 mL via INTRAVENOUS

## 2019-11-16 MED ORDER — FENTANYL CITRATE (PF) 100 MCG/2ML IJ SOLN
50.0000 ug | Freq: Once | INTRAMUSCULAR | Status: AC
  Administered 2019-11-16: 50 ug via INTRAVENOUS
  Filled 2019-11-16: qty 2

## 2019-11-16 MED ORDER — LACTATED RINGERS IV BOLUS
1000.0000 mL | Freq: Once | INTRAVENOUS | Status: AC
  Administered 2019-11-16: 1000 mL via INTRAVENOUS

## 2019-11-16 NOTE — ED Provider Notes (Signed)
[facility] EMERGENCY DEPARTMENT Provider Note   CSN: [id number] Arrival date & time: 11/15/19  1749     History Chief Complaint  Patient presents with  . Abdominal Pain    Adrian Miller is a 79 y.o. male.  The history is provided by the patient.  Abdominal Pain Pain location:  RLQ Pain quality: aching   Pain radiates to:  Does not radiate Pain severity:  Moderate Onset quality:  Gradual Timing:  Constant Progression:  Worsening Chronicity:  Chronic Relieved by:  Nothing Worsened by:  Palpation Associated symptoms: dysuria and nausea   Associated symptoms: no chest pain, no constipation, no diarrhea, no fever, no hematochezia, no melena, no shortness of breath and no vomiting   Patient history of hypertension, hiatal hernia presents with right lower quadrant abdominal pain.  Patient reports he has had this pain for quite some time.  He reports that has had previous abdominal surgeries and he now has abdominal hernias.  He reports he feels that they are worsening.  No fevers or vomiting.  He does not have follow-up as of yet with a surgeon     Past Medical History:  Diagnosis Date  . Acute esophagitis 06/01/2015  . Benign essential HTN 05/31/2015  . Duodenal ulcer hemorrhage   . Esophageal stricture 06/01/2015  . Hiatal hernia 06/01/2015    Patient Active Problem List   Diagnosis Date Noted  . Routine general medical examination at a health care facility 10/25/2019  . Bilateral recurrent inguinal hernia without obstruction or gangrene 10/22/2019  . PAD (peripheral artery disease) ([place]) 01/22/2018  . Long-term current use of opiate analgesic 10/17/2017  . Renal insufficiency 09/29/2017  . Encounter for long-term use of opiate analgesic 08/09/2017  . Bradycardia 11/26/2016  . Left lumbar radiculopathy 01/18/2016  . Left carpal tunnel syndrome 12/28/2015  . Hyperlipidemia with target LDL less than 130 12/26/2015  . GERD (gastroesophageal reflux  disease) 12/19/2015  . Tobacco abuse counseling 12/19/2015  . Prediabetes 06/14/2015  . Generalized OA 06/14/2015  . B12 deficiency anemia 06/14/2015  . Other iron deficiency anemias 06/14/2015  . Esophageal stricture 06/01/2015  . Benign essential HTN 05/31/2015  . Duodenal ulcer hemorrhage     Past Surgical History:  Procedure Laterality Date  . CHOLECYSTECTOMY N/A 09/30/2017   Procedure: LAPAROSCOPIC CHOLECYSTECTOMY WITH INTRAOPERATIVE CHOLANGIOGRAM;  Surgeon: Excell Seltzer, MD;  Location: [facility];  Service: General;  Laterality: N/A;  . ESOPHAGOGASTRODUODENOSCOPY N/A 05/31/2015   Procedure: ESOPHAGOGASTRODUODENOSCOPY (EGD);  Surgeon: Ladene Artist, MD;  Location: [facility];  Service: Endoscopy;  Laterality: N/A;  . HERNIA REPAIR  1975  . LEFT ROTATOR CUFF REPAIR  2002  . REPLACEMENT TOTAL KNEE Left   . REVERSE SHOULDER ARTHROPLASTY Right 03/20/2018   Procedure: RIGHT REVERSE SHOULDER ARTHROPLASTY;  Surgeon: Tania Ade, MD;  Location: [facility];  Service: Orthopedics;  Laterality: Right;  . TOTAL KNEE ARTHROPLASTY Right 02/22/2015   Procedure: TOTAL KNEE ARTHROPLASTY;  Surgeon: Melrose Nakayama, MD;  Location: [facility];  Service: Orthopedics;  Laterality: Right;       Family History  Problem Relation Age of Onset  . Hypertension Mother   . Heart attack Sister     Social History   Tobacco Use  . Smoking status: Current Every Day Smoker    Packs/day: 0.50    Years: 51.00    Pack years: 25.50    Types: Cigarettes  . Smokeless tobacco: Former Systems developer    Quit date: 11/03/2014  Vaping Use  .  Vaping Use: Never used  Substance Use Topics  . Alcohol use: No    Alcohol/week: 0.0 standard drinks  . Drug use: No    Home Medications Prior to Admission medications   Medication Sig Start Date End Date Taking? Authorizing Provider  chlorthalidone (HYGROTON) 25 MG tablet TAKE 1 TABLET BY MOUTH EVERY DAY Patient taking differently: Take 25 mg by mouth daily.  10/22/19  Yes  Janith Lima, MD  oxyCODONE-acetaminophen (PERCOCET) 10-325 MG tablet Take 1 tablet by mouth every 6 (six) hours as needed for pain. 10/23/19  Yes Janith Lima, MD  atorvastatin (LIPITOR) 20 MG tablet TAKE 1 TABLET BY MOUTH EVERY DAY Patient not taking: Reported on 11/16/2019 03/01/19   Janith Lima, MD  ferrous sulfate 325 (65 FE) MG tablet Take 1 tablet (325 mg total) by mouth 2 (two) times daily with a meal. Patient not taking: Reported on 11/16/2019 12/02/18   Janith Lima, MD  chlorthalidone (HYGROTON) 25 MG tablet TAKE 1 TABLET BY MOUTH EVERY DAY 05/06/19   Janith Lima, MD    Allergies    Asa [aspirin]  Review of Systems   Review of Systems  Constitutional: Negative for fever.  Respiratory: Negative for shortness of breath.   Cardiovascular: Negative for chest pain.  Gastrointestinal: Positive for abdominal pain and nausea. Negative for blood in stool, constipation, diarrhea, hematochezia, melena and vomiting.  Genitourinary: Positive for dysuria.  All other systems reviewed and are negative.   Physical Exam Updated Vital Signs BP (!) 144/79   Pulse (!) 55   Temp (!) 97.4 F (36.3 C) (Oral)   Resp 16   SpO2 100%   Physical Exam  CONSTITUTIONAL: Elderly, no acute distress HEAD: Normocephalic/atraumatic EYES: EOMI/PERRL ENMT: Mucous membranes moist NECK: supple no meningeal signs SPINE/BACK:entire spine nontender CV: S1/S2 noted, no murmurs/rubs/gallops noted LUNGS: Lungs are clear to auscultation bilaterally, no apparent distress ABDOMEN: soft, moderate RLQ tenderness, no rebound or guarding, bowel sounds noted throughout abdomen GU:no cva tenderness, no scrotal tenderness or edema.  Testicles are descended bilaterally.  No obvious inguinal hernia noted.  Nurse chaperone present for exam NEURO: Pt is awake/alert/appropriate, moves all extremitiesx4.  No facial droop.   EXTREMITIES: pulses normal/equal, full ROM SKIN: warm, color normal PSYCH: no  abnormalities of mood noted, alert and oriented to situation  ED Results / Procedures / Treatments   Labs (all labs ordered are listed, but only abnormal results are displayed) Labs Reviewed  COMPREHENSIVE METABOLIC PANEL - Abnormal; Notable for the following components:      Result Value   Glucose, Bld 106 (*)    Creatinine, Ser 1.60 (*)    Total Protein 8.3 (*)    GFR calc non Af Amer 41 (*)    GFR calc Af Amer 47 (*)    All other components within normal limits  URINALYSIS, ROUTINE W REFLEX MICROSCOPIC - Abnormal; Notable for the following components:   Specific Gravity, Urine 1.044 (*)    All other components within normal limits  LIPASE, BLOOD  CBC    EKG None  Radiology CT ABDOMEN PELVIS W CONTRAST  Result Date: 11/16/2019 CLINICAL DATA:  79 year old male with acute abdominal pain. EXAM: CT ABDOMEN AND PELVIS WITH CONTRAST TECHNIQUE: Multidetector CT imaging of the abdomen and pelvis was performed using the standard protocol following bolus administration of intravenous contrast. CONTRAST:  157mL OMNIPAQUE IOHEXOL 300 MG/ML  SOLN COMPARISON:  CT abdomen pelvis dated 08/20/2018. FINDINGS: Lower chest: The visualized lung bases are  clear. No intra-abdominal free air or free fluid. Hepatobiliary: The liver is unremarkable. Mild intrahepatic biliary ductal dilatation. Cholecystectomy. No retained calcified stone in the central CBD. Pancreas: Unremarkable. No pancreatic ductal dilatation or surrounding inflammatory changes. Spleen: Normal in size without focal abnormality. Adrenals/Urinary Tract: The adrenal glands are unremarkable. There is no hydronephrosis on either side. There is symmetric enhancement and excretion of contrast by both kidneys. Multiple bilateral renal cysts measuring up to 5.5 cm on the left. The visualized ureters and urinary bladder appear unremarkable. Stomach/Bowel: There is severe sigmoid diverticulosis with muscular hypertrophy. No active inflammatory changes.  There is no bowel obstruction or active inflammation. The appendix is normal. Vascular/Lymphatic: Moderate aortoiliac atherosclerotic disease. The IVC is unremarkable. No portal venous gas. There is no adenopathy. Reproductive: The prostate and seminal vesicles are grossly unremarkable. Other: Probable small right anterolateral abdominal wall intramuscular lipoma. Musculoskeletal: Degenerative changes of the spine. No acute osseous pathology. Chronic calcification of the insertion of the hamstring on the left. IMPRESSION: 1. No acute intra-abdominal or pelvic pathology. 2. Severe sigmoid diverticulosis. No bowel obstruction. Normal appendix. 3. Aortic Atherosclerosis (ICD10-I70.0). Electronically Signed   By: Anner Crete M.D.   On: 11/16/2019 01:44    Procedures Procedures   Medications Ordered in ED Medications  sodium chloride flush (NS) 0.9 % injection 3 mL (3 mLs Intravenous Not Given 11/16/19 0010)  lactated ringers bolus 1,000 mL (0 mLs Intravenous Stopped 11/16/19 0205)  fentaNYL (SUBLIMAZE) injection 50 mcg (50 mcg Intravenous Given 11/16/19 0100)  iohexol (OMNIPAQUE) 300 MG/ML solution 100 mL (100 mLs Intravenous Contrast Given 11/16/19 0113)    ED Course  I have reviewed the triage vital signs and the nursing notes.  Pertinent labs & imaging results that were available during my care of the patient were reviewed by me and considered in my medical decision making (see chart for details).    MDM Rules/Calculators/A&P                           This patient presents to the ED for concern of abdominal pain, this involves an extensive number of treatment options, and is a complaint that carries with it a high risk of complications and morbidity.  The differential diagnosis includes cystitis, kidney stone, bowel perforation, bowel obstruction, incarcerated hernia   Lab Tests:   I Ordered, reviewed, and interpreted labs, which included electrolytes, complete blood count,  urinalysis  Medicines ordered:   I ordered medication fentanyl for pain  Imaging Studies ordered:   I ordered imaging studies which included CT imaging   I independently visualized and interpreted imaging which showed no acute findings  Additional history obtained:   Additional history obtained from daughter Annye Rusk  Previous records obtained and reviewed    Reevaluation:  After the interventions stated above, I reevaluated the patient and found patient improved resting comfortably  Patient presents with worsening chronic abdominal pain. Patient was told he had hernias. He had focal right lower quadrant abdominal tenderness, but no signs of incarcerated hernia. CT imaging performed without any acute findings. Patient stable, resting comfortably. Patient is appropriate for discharge home. He will be referred to his PCP and gastroenterology. Patient did give permission for me to speak to his daughter Annye Rusk via phone. She was updated on plan Final Clinical Impression(s) / ED Diagnoses Final diagnoses:  Right lower quadrant abdominal pain    Rx / DC Orders ED Discharge Orders    None  Ripley Fraise, MD 11/16/19 [phone]

## 2019-11-16 NOTE — ED Notes (Signed)
Patient verbalizes understanding of discharge instructions. Opportunity for questioning and answers were provided. Armband removed by staff, pt discharged from ED. Wheeled out to lobby  

## 2019-11-20 ENCOUNTER — Telehealth: Payer: Self-pay | Admitting: Internal Medicine

## 2019-11-20 NOTE — Telephone Encounter (Signed)
New message:   1.Medication Requested: oxyCODONE-acetaminophen (PERCOCET) 10-325 MG tablet 2. Pharmacy (Name, Street, [place]): [id number] - [place], [place] 3. On Med List: Yes  4. Last Visit with PCP: 10/22/19  5. Next visit date with PCP: None   Agent: Please be advised that RX refills may take up to 3 business days. We ask that you follow-up with your pharmacy.

## 2019-11-21 ENCOUNTER — Other Ambulatory Visit: Payer: Self-pay | Admitting: Internal Medicine

## 2019-11-21 DIAGNOSIS — M159 Polyosteoarthritis, unspecified: Secondary | ICD-10-CM

## 2019-11-21 DIAGNOSIS — M5416 Radiculopathy, lumbar region: Secondary | ICD-10-CM

## 2019-11-21 MED ORDER — OXYCODONE-ACETAMINOPHEN 10-325 MG PO TABS: 1.0000 | ORAL_TABLET | Freq: Four times a day (QID) | ORAL | 0 refills | Status: DC | PRN

## 2019-12-01 DIAGNOSIS — M5416 Radiculopathy, lumbar region: Secondary | ICD-10-CM | POA: Diagnosis not present

## 2019-12-02 DIAGNOSIS — G8929 Other chronic pain: Secondary | ICD-10-CM | POA: Diagnosis not present

## 2019-12-02 DIAGNOSIS — R1031 Right lower quadrant pain: Secondary | ICD-10-CM | POA: Diagnosis not present

## 2019-12-02 DIAGNOSIS — K5909 Other constipation: Secondary | ICD-10-CM | POA: Diagnosis not present

## 2019-12-22 ENCOUNTER — Telehealth: Payer: Self-pay

## 2019-12-22 NOTE — Telephone Encounter (Signed)
1.Medication Requested:oxyCODONE-acetaminophen (PERCOCET) 10-325 MG tablet  2. Pharmacy (Name, Street, City):[id number] [place], [place] - [address]  3. On Med List: YES   4. Last Visit with PCP: 6.17.21   5. Next visit date with PCP: N/A   Agent: Please be advised that RX refills may take up to 3 business days. We ask that you follow-up with your pharmacy.

## 2019-12-23 ENCOUNTER — Other Ambulatory Visit: Payer: Self-pay | Admitting: Internal Medicine

## 2019-12-23 DIAGNOSIS — M159 Polyosteoarthritis, unspecified: Secondary | ICD-10-CM

## 2019-12-23 DIAGNOSIS — M5416 Radiculopathy, lumbar region: Secondary | ICD-10-CM

## 2019-12-23 MED ORDER — OXYCODONE-ACETAMINOPHEN 10-325 MG PO TABS: 1.0000 | ORAL_TABLET | Freq: Four times a day (QID) | ORAL | 0 refills | Status: DC | PRN

## 2020-01-21 ENCOUNTER — Telehealth: Payer: Self-pay | Admitting: Internal Medicine

## 2020-01-21 NOTE — Telephone Encounter (Signed)
    Patient requesting refill for chlorthalidone (HYGROTON) 25 MG tablet and  oxyCODONE-acetaminophen (PERCOCET) 10-325 MG tablet Pharmacy:[id number] [address] [place],  - [address] Last ov 10/22/19

## 2020-01-22 ENCOUNTER — Other Ambulatory Visit: Payer: Self-pay | Admitting: Internal Medicine

## 2020-01-22 DIAGNOSIS — I10 Essential (primary) hypertension: Secondary | ICD-10-CM

## 2020-01-22 DIAGNOSIS — M159 Polyosteoarthritis, unspecified: Secondary | ICD-10-CM

## 2020-01-22 DIAGNOSIS — M5416 Radiculopathy, lumbar region: Secondary | ICD-10-CM

## 2020-01-22 MED ORDER — OXYCODONE-ACETAMINOPHEN 10-325 MG PO TABS: 1.0000 | ORAL_TABLET | Freq: Four times a day (QID) | ORAL | 0 refills | Status: DC | PRN

## 2020-01-22 MED ORDER — CHLORTHALIDONE 25 MG PO TABS: 25.0000 mg | ORAL_TABLET | Freq: Every day | ORAL | 0 refills | Status: DC

## 2020-02-19 ENCOUNTER — Telehealth: Payer: Self-pay | Admitting: Internal Medicine

## 2020-02-19 NOTE — Telephone Encounter (Signed)
oxyCODONE-acetaminophen (PERCOCET) 10-325 MG tablet [id number] [place], [place] Phone:  [phone]  Fax:  [phone]     Would like a refill on medication  Next apt- 06.17.21

## 2020-02-20 ENCOUNTER — Other Ambulatory Visit: Payer: Self-pay | Admitting: Internal Medicine

## 2020-02-20 DIAGNOSIS — M5416 Radiculopathy, lumbar region: Secondary | ICD-10-CM

## 2020-02-20 DIAGNOSIS — M159 Polyosteoarthritis, unspecified: Secondary | ICD-10-CM

## 2020-02-20 MED ORDER — OXYCODONE-ACETAMINOPHEN 10-325 MG PO TABS: 1.0000 | ORAL_TABLET | Freq: Four times a day (QID) | ORAL | 0 refills | Status: DC | PRN

## 2020-03-22 ENCOUNTER — Telehealth: Payer: Self-pay | Admitting: Internal Medicine

## 2020-03-22 NOTE — Telephone Encounter (Signed)
1.Medication Requested: oxyCODONE-acetaminophen (PERCOCET) 10-325 MG tablet  2. Pharmacy (Name, [place]): [id number] - [place], [place] Phone:  [phone]  Fax:  [phone]     3. On Med List: yes  4. Last Visit with PCP: 06.17.21  5. Next visit date with PCP: N/A   .

## 2020-03-23 ENCOUNTER — Other Ambulatory Visit: Payer: Self-pay | Admitting: Internal Medicine

## 2020-03-23 DIAGNOSIS — M5416 Radiculopathy, lumbar region: Secondary | ICD-10-CM

## 2020-03-23 DIAGNOSIS — M159 Polyosteoarthritis, unspecified: Secondary | ICD-10-CM

## 2020-03-23 MED ORDER — OXYCODONE-ACETAMINOPHEN 10-325 MG PO TABS: 1.0000 | ORAL_TABLET | Freq: Four times a day (QID) | ORAL | 0 refills | Status: DC | PRN

## 2020-04-20 ENCOUNTER — Telehealth: Payer: Self-pay | Admitting: Internal Medicine

## 2020-04-20 NOTE — Telephone Encounter (Signed)
1.Medication Requested: oxyCODONE-acetaminophen (PERCOCET) 10-325 MG tablet  2. Pharmacy (Name, Street, [place]): [id number] - [place], [place]  3. On Med List: Y  4. Last Visit with PCP: 6.17.21  5. Next visit date with PCP:  n/a   Agent: Please be advised that RX refills may take up to 3 business days. We ask that you follow-up with your pharmacy.

## 2020-04-20 NOTE — Telephone Encounter (Signed)
He has to be seen every 4-6 months for this

## 2020-04-20 NOTE — Telephone Encounter (Signed)
Pt has made an OV for 12/20 @ 3.20pm

## 2020-04-25 ENCOUNTER — Ambulatory Visit (INDEPENDENT_AMBULATORY_CARE_PROVIDER_SITE_OTHER): Payer: Medicare Other | Admitting: Internal Medicine

## 2020-04-25 ENCOUNTER — Encounter: Payer: Self-pay | Admitting: Internal Medicine

## 2020-04-25 ENCOUNTER — Other Ambulatory Visit: Payer: Self-pay

## 2020-04-25 VITALS — BP 122/68 | HR 62 | Temp 98.2°F | Ht 69.0 in | Wt 184.0 lb

## 2020-04-25 DIAGNOSIS — Z79891 Long term (current) use of opiate analgesic: Secondary | ICD-10-CM | POA: Diagnosis not present

## 2020-04-25 DIAGNOSIS — I10 Essential (primary) hypertension: Secondary | ICD-10-CM

## 2020-04-25 DIAGNOSIS — M159 Polyosteoarthritis, unspecified: Secondary | ICD-10-CM

## 2020-04-25 DIAGNOSIS — N289 Disorder of kidney and ureter, unspecified: Secondary | ICD-10-CM

## 2020-04-25 DIAGNOSIS — Z23 Encounter for immunization: Secondary | ICD-10-CM

## 2020-04-25 DIAGNOSIS — R7303 Prediabetes: Secondary | ICD-10-CM | POA: Diagnosis not present

## 2020-04-25 DIAGNOSIS — M5416 Radiculopathy, lumbar region: Secondary | ICD-10-CM

## 2020-04-25 LAB — URINALYSIS, ROUTINE W REFLEX MICROSCOPIC
Bilirubin Urine: NEGATIVE
Hgb urine dipstick: NEGATIVE
Ketones, ur: NEGATIVE
Leukocytes,Ua: NEGATIVE
Nitrite: NEGATIVE
RBC / HPF: NONE SEEN (ref 0–?)
Specific Gravity, Urine: 1.025 (ref 1.000–1.030)
Total Protein, Urine: NEGATIVE
Urine Glucose: NEGATIVE
Urobilinogen, UA: 1 (ref 0.0–1.0)
pH: 6 (ref 5.0–8.0)

## 2020-04-25 LAB — BASIC METABOLIC PANEL
BUN: 16 mg/dL (ref 6–23)
CO2: 29 mEq/L (ref 19–32)
Calcium: 9.5 mg/dL (ref 8.4–10.5)
Chloride: 100 mEq/L (ref 96–112)
Creatinine, Ser: 1.16 mg/dL (ref 0.40–1.50)
GFR: 60.13 mL/min (ref >60.00)
Glucose, Bld: 85 mg/dL (ref 70–99)
Potassium: 3.6 mEq/L (ref 3.5–5.1)
Sodium: 137 mEq/L (ref 135–145)

## 2020-04-25 LAB — HEMOGLOBIN A1C: Hgb A1c MFr Bld: 5.8 % (ref 4.6–6.5)

## 2020-04-25 MED ORDER — OXYCODONE-ACETAMINOPHEN 10-325 MG PO TABS: 1.0000 | ORAL_TABLET | Freq: Four times a day (QID) | ORAL | 0 refills | Status: DC | PRN

## 2020-04-25 NOTE — Progress Notes (Signed)
Subjective:  Patient ID: Adrian Miller, male    DOB: [birth year]  Age: 79 y.o. MRN: [id number]  CC: Hypertension and Osteoarthritis  This visit occurred during the SARS-CoV-2 public health emergency.  Safety protocols were in place, including screening questions prior to the visit, additional usage of staff PPE, and extensive cleaning of exam room while observing appropriate contact time as indicated for disinfecting solutions.    HPI TIRSO LAWS presents for f/up - He continues to complain of musculoskeletal pain and would like to continue taking Percocet.  He has nonradiating low back pain and denies lower extremity paresthesias.  He has chronic pain in his large joints but denies any recent episodes of redness or swelling in his joints.  He gets adequate symptom relief and the Percocet allows him to sleep and do his ADLs.  He tells me his blood pressure has been well controlled.  He is active and denies any recent episodes of headache, blurred vision, chest pain, shortness of breath, dyspnea on exertion, palpitations, edema, fatigue, dizziness, lightheadedness, or near syncope.  Outpatient Medications Prior to Visit  Medication Sig Dispense Refill  . atorvastatin (LIPITOR) 20 MG tablet TAKE 1 TABLET BY MOUTH EVERY DAY 90 tablet 1  . chlorthalidone (HYGROTON) 25 MG tablet Take 1 tablet (25 mg total) by mouth daily. 90 tablet 0  . ferrous sulfate 325 (65 FE) MG tablet Take 1 tablet (325 mg total) by mouth 2 (two) times daily with a meal. 180 tablet 1  . oxyCODONE-acetaminophen (PERCOCET) 10-325 MG tablet Take 1 tablet by mouth every 6 (six) hours as needed for pain. 100 tablet 0   No facility-administered medications prior to visit.    ROS Review of Systems  Constitutional: Negative.  Negative for appetite change, diaphoresis, fatigue and unexpected weight change.  HENT: Negative.   Eyes: Negative for visual disturbance.  Respiratory: Negative for cough, chest tightness, shortness of  breath and wheezing.   Cardiovascular: Negative for chest pain, palpitations and leg swelling.  Gastrointestinal: Negative for abdominal pain, constipation, diarrhea, nausea and vomiting.  Endocrine: Negative.   Genitourinary: Negative.  Negative for difficulty urinating, dysuria and hematuria.  Musculoskeletal: Positive for arthralgias and back pain. Negative for myalgias and neck pain.  Skin: Negative.  Negative for color change and pallor.  Neurological: Negative.  Negative for dizziness, weakness, light-headedness and headaches.  Hematological: Negative for adenopathy. Does not bruise/bleed easily.  Psychiatric/Behavioral: Negative.     Objective:  BP 122/68   Pulse 62   Temp 98.2 F (36.8 C) (Oral)   Ht 5\' 9"  (1.753 m)   Wt 184 lb (83.5 kg)   SpO2 97%   BMI 27.17 kg/m   BP Readings from Last 3 Encounters:  04/25/20 122/68  11/16/19 122/83  10/22/19 130/70    Wt Readings from Last 3 Encounters:  04/25/20 184 lb (83.5 kg)  10/22/19 194 lb 4 oz (88.1 kg)  08/25/19 193 lb (87.5 kg)    Physical Exam Vitals reviewed.  Constitutional:      Appearance: Normal appearance.  HENT:     Nose: Nose normal.     Mouth/Throat:     Mouth: Mucous membranes are moist.  Eyes:     General: No scleral icterus.    Conjunctiva/sclera: Conjunctivae normal.  Cardiovascular:     Rate and Rhythm: Normal rate and regular rhythm.     Heart sounds: No murmur heard.   Pulmonary:     Effort: Pulmonary effort is normal.  Breath sounds: No stridor. No wheezing, rhonchi or rales.  Abdominal:     General: Abdomen is flat. Bowel sounds are normal. There is no distension.     Palpations: Abdomen is soft. There is no hepatomegaly, splenomegaly or mass.     Tenderness: There is no abdominal tenderness.  Musculoskeletal:        General: Normal range of motion.     Cervical back: Neck supple.     Right lower leg: No edema.     Left lower leg: No edema.  Lymphadenopathy:     Cervical: No  cervical adenopathy.  Skin:    General: Skin is warm and dry.     Coloration: Skin is not pale.  Neurological:     General: No focal deficit present.     Mental Status: He is alert and oriented to person, place, and time. Mental status is at baseline.  Psychiatric:        Mood and Affect: Mood normal.        Behavior: Behavior normal.     Lab Results  Component Value Date   WBC 10.0 11/15/2019   HGB 14.9 11/15/2019   HCT 44.2 11/15/2019   PLT 397 11/15/2019   GLUCOSE 85 04/25/2020   CHOL 162 10/22/2019   TRIG 301.0 (H) 10/22/2019   HDL 29.00 (L) 10/22/2019   LDLDIRECT 47.0 10/22/2019   LDLCALC 29 12/02/2018   ALT 35 11/15/2019   AST 24 11/15/2019   NA 137 04/25/2020   K 3.6 04/25/2020   CL 100 04/25/2020   CREATININE 1.16 04/25/2020   BUN 16 04/25/2020   CO2 29 04/25/2020   TSH 3.56 10/22/2019   PSA 0.87 03/18/2014   INR 1.02 03/13/2018   HGBA1C 5.8 04/25/2020    CT ABDOMEN PELVIS W CONTRAST  Result Date: 11/16/2019 CLINICAL DATA:  79 year old male with acute abdominal pain. EXAM: CT ABDOMEN AND PELVIS WITH CONTRAST TECHNIQUE: Multidetector CT imaging of the abdomen and pelvis was performed using the standard protocol following bolus administration of intravenous contrast. CONTRAST:  141mL OMNIPAQUE IOHEXOL 300 MG/ML  SOLN COMPARISON:  CT abdomen pelvis dated 08/20/2018. FINDINGS: Lower chest: The visualized lung bases are clear. No intra-abdominal free air or free fluid. Hepatobiliary: The liver is unremarkable. Mild intrahepatic biliary ductal dilatation. Cholecystectomy. No retained calcified stone in the central CBD. Pancreas: Unremarkable. No pancreatic ductal dilatation or surrounding inflammatory changes. Spleen: Normal in size without focal abnormality. Adrenals/Urinary Tract: The adrenal glands are unremarkable. There is no hydronephrosis on either side. There is symmetric enhancement and excretion of contrast by both kidneys. Multiple bilateral renal cysts measuring  up to 5.5 cm on the left. The visualized ureters and urinary bladder appear unremarkable. Stomach/Bowel: There is severe sigmoid diverticulosis with muscular hypertrophy. No active inflammatory changes. There is no bowel obstruction or active inflammation. The appendix is normal. Vascular/Lymphatic: Moderate aortoiliac atherosclerotic disease. The IVC is unremarkable. No portal venous gas. There is no adenopathy. Reproductive: The prostate and seminal vesicles are grossly unremarkable. Other: Probable small right anterolateral abdominal wall intramuscular lipoma. Musculoskeletal: Degenerative changes of the spine. No acute osseous pathology. Chronic calcification of the insertion of the hamstring on the left. IMPRESSION: 1. No acute intra-abdominal or pelvic pathology. 2. Severe sigmoid diverticulosis. No bowel obstruction. Normal appendix. 3. Aortic Atherosclerosis (ICD10-I70.0). Electronically Signed   By: Anner Crete M.D.   On: 11/16/2019 01:44    Assessment & Plan:   Saint was seen today for hypertension and osteoarthritis.  Diagnoses and all  orders for this visit:  Benign essential HTN- His blood pressure is adequately well controlled. -     Basic metabolic panel; Future -     Urinalysis, Routine w reflex microscopic; Future -     Urinalysis, Routine w reflex microscopic -     Basic metabolic panel  Generalized OA- The UDS confirms that he is taking oxycodone.  Will continue the current dosage. -     oxyCODONE-acetaminophen (PERCOCET) 10-325 MG tablet; Take 1 tablet by mouth every 6 (six) hours as needed for pain.  Left lumbar radiculopathy- See sbove -     oxyCODONE-acetaminophen (PERCOCET) 10-325 MG tablet; Take 1 tablet by mouth every 6 (six) hours as needed for pain.  Renal insufficiency- His renal function is stable.  He will avoid nephrotoxic agents.  His blood pressure is adequately well controlled.       -     Basic metabolic panel; Future -     Urinalysis, Routine w reflex  microscopic; Future -     Urinalysis, Routine w reflex microscopic -     Basic metabolic panel  Long-term current use of opiate analgesic -     Oxycodone/Oxymorphone, Urine; Future -     Oxycodone/Oxymorphone, Urine  Prediabetes- His A1c is at 5.8%.  Medical therapy is not indicated. -     Hemoglobin A1c; Future -     Hemoglobin A1c  Flu vaccine need -     Flu Vaccine QUAD High Dose(Fluad)   I am having Myreon L. Fuhrer maintain his ferrous sulfate, atorvastatin, chlorthalidone, and oxyCODONE-acetaminophen.  Meds ordered this encounter  Medications  . oxyCODONE-acetaminophen (PERCOCET) 10-325 MG tablet    Sig: Take 1 tablet by mouth every 6 (six) hours as needed for pain.    Dispense:  100 tablet    Refill:  0   I spent 50 minutes in preparing to see the patient by review of recent labs, imaging and procedures, obtaining and reviewing separately obtained history, communicating with the patient and family or caregiver, ordering medications, tests or procedures, and documenting clinical information in the EHR including the differential Dx, treatment, and any further evaluation and other management of 1. Generalized OA 2. Left lumbar radiculopathy 3. Benign essential HTN 4. Renal insufficiency 5. Long-term current use of opiate analgesic 6. Prediabetes    Follow-up: Return in about 6 months (around 10/24/2020).  Scarlette Calico, MD

## 2020-04-25 NOTE — Patient Instructions (Signed)

## 2020-04-27 LAB — OXYCODONE/OXYMORPHONE, URINE: Oxycodone+Oxymorphone Ur Ql Scn: POSITIVE ng/mL — AB

## 2020-05-18 ENCOUNTER — Telehealth: Payer: Self-pay | Admitting: Internal Medicine

## 2020-05-18 NOTE — Telephone Encounter (Signed)
Patient has been having black stool and is cold so they werent sure what to do or if the patient was anemic, wanted to ask Dr. Ronnald Ramp what they should do or if he can send him anything in.

## 2020-05-19 ENCOUNTER — Encounter: Payer: Self-pay | Admitting: Internal Medicine

## 2020-05-19 ENCOUNTER — Ambulatory Visit (INDEPENDENT_AMBULATORY_CARE_PROVIDER_SITE_OTHER): Payer: Medicare Other | Admitting: Internal Medicine

## 2020-05-19 ENCOUNTER — Observation Stay (HOSPITAL_COMMUNITY)
Admission: EM | Admit: 2020-05-19 | Discharge: 2020-05-21 | Disposition: A | Discharge status: Home / Self Care | Payer: Medicare Other | Attending: Internal Medicine | Admitting: Internal Medicine

## 2020-05-19 ENCOUNTER — Encounter (HOSPITAL_COMMUNITY): Payer: Self-pay

## 2020-05-19 ENCOUNTER — Other Ambulatory Visit: Payer: Self-pay

## 2020-05-19 VITALS — BP 118/76 | HR 93 | Temp 98.7°F | Resp 16 | Ht 69.0 in

## 2020-05-19 DIAGNOSIS — R55 Syncope and collapse: Secondary | ICD-10-CM | POA: Insufficient documentation

## 2020-05-19 DIAGNOSIS — I10 Essential (primary) hypertension: Secondary | ICD-10-CM

## 2020-05-19 DIAGNOSIS — Z20822 Contact with and (suspected) exposure to covid-19: Secondary | ICD-10-CM | POA: Diagnosis not present

## 2020-05-19 DIAGNOSIS — E876 Hypokalemia: Secondary | ICD-10-CM | POA: Diagnosis not present

## 2020-05-19 DIAGNOSIS — K449 Diaphragmatic hernia without obstruction or gangrene: Secondary | ICD-10-CM | POA: Insufficient documentation

## 2020-05-19 DIAGNOSIS — Z79899 Other long term (current) drug therapy: Secondary | ICD-10-CM | POA: Insufficient documentation

## 2020-05-19 DIAGNOSIS — K269 Duodenal ulcer, unspecified as acute or chronic, without hemorrhage or perforation: Secondary | ICD-10-CM | POA: Diagnosis not present

## 2020-05-19 DIAGNOSIS — K920 Hematemesis: Secondary | ICD-10-CM | POA: Insufficient documentation

## 2020-05-19 DIAGNOSIS — D62 Acute posthemorrhagic anemia: Secondary | ICD-10-CM

## 2020-05-19 DIAGNOSIS — K222 Esophageal obstruction: Secondary | ICD-10-CM | POA: Diagnosis not present

## 2020-05-19 DIAGNOSIS — I951 Orthostatic hypotension: Secondary | ICD-10-CM | POA: Diagnosis not present

## 2020-05-19 DIAGNOSIS — K259 Gastric ulcer, unspecified as acute or chronic, without hemorrhage or perforation: Secondary | ICD-10-CM | POA: Diagnosis not present

## 2020-05-19 DIAGNOSIS — K2100 Gastro-esophageal reflux disease with esophagitis, without bleeding: Principal | ICD-10-CM | POA: Insufficient documentation

## 2020-05-19 DIAGNOSIS — G8929 Other chronic pain: Secondary | ICD-10-CM | POA: Diagnosis present

## 2020-05-19 DIAGNOSIS — R748 Abnormal levels of other serum enzymes: Secondary | ICD-10-CM | POA: Diagnosis not present

## 2020-05-19 DIAGNOSIS — N182 Chronic kidney disease, stage 2 (mild): Secondary | ICD-10-CM | POA: Diagnosis not present

## 2020-05-19 DIAGNOSIS — D649 Anemia, unspecified: Secondary | ICD-10-CM

## 2020-05-19 DIAGNOSIS — F17210 Nicotine dependence, cigarettes, uncomplicated: Secondary | ICD-10-CM | POA: Diagnosis not present

## 2020-05-19 DIAGNOSIS — I129 Hypertensive chronic kidney disease with stage 1 through stage 4 chronic kidney disease, or unspecified chronic kidney disease: Secondary | ICD-10-CM | POA: Diagnosis not present

## 2020-05-19 DIAGNOSIS — K922 Gastrointestinal hemorrhage, unspecified: Secondary | ICD-10-CM | POA: Diagnosis not present

## 2020-05-19 DIAGNOSIS — K921 Melena: Secondary | ICD-10-CM | POA: Diagnosis not present

## 2020-05-19 DIAGNOSIS — R10817 Generalized abdominal tenderness: Secondary | ICD-10-CM

## 2020-05-19 LAB — CBC WITH DIFFERENTIAL/PLATELET
Basophils Absolute: 0 10*3/uL (ref 0.0–0.1)
Basophils Relative: 0.4 % (ref 0.0–3.0)
Eosinophils Absolute: 0.1 10*3/uL (ref 0.0–0.7)
Eosinophils Relative: 1.3 % (ref 0.0–5.0)
HCT: 33.4 % — ABNORMAL LOW (ref 39.0–52.0)
Hemoglobin: 11.3 g/dL — ABNORMAL LOW (ref 13.0–17.0)
Lymphocytes Relative: 24.5 % (ref 12.0–46.0)
Lymphs Abs: 2.6 10*3/uL (ref 0.7–4.0)
MCHC: 33.9 g/dL (ref 30.0–36.0)
MCV: 91.5 fl (ref 78.0–100.0)
Monocytes Absolute: 0.9 10*3/uL (ref 0.1–1.0)
Monocytes Relative: 8.1 % (ref 3.0–12.0)
Neutro Abs: 7 10*3/uL (ref 1.4–7.7)
Neutrophils Relative %: 65.7 % (ref 43.0–77.0)
Platelets: 336 10*3/uL (ref 150.0–400.0)
RBC: 3.65 Mil/uL — ABNORMAL LOW (ref 4.22–5.81)
RDW: 14.5 % (ref 11.5–15.5)
WBC: 10.7 10*3/uL — ABNORMAL HIGH (ref 4.0–10.5)

## 2020-05-19 LAB — COMPREHENSIVE METABOLIC PANEL
ALT: 53 U/L — ABNORMAL HIGH (ref 0–44)
AST: 52 U/L — ABNORMAL HIGH (ref 15–41)
Albumin: 4.5 g/dL (ref 3.5–5.0)
Alkaline Phosphatase: 83 U/L (ref 38–126)
Anion gap: 10 (ref 5–15)
BUN: 39 mg/dL — ABNORMAL HIGH (ref 8–23)
CO2: 27 mmol/L (ref 22–32)
Calcium: 9.2 mg/dL (ref 8.9–10.3)
Chloride: 99 mmol/L (ref 98–111)
Creatinine, Ser: 1.29 mg/dL — ABNORMAL HIGH (ref 0.61–1.24)
GFR, Estimated: 56 mL/min — ABNORMAL LOW (ref >60)
Glucose, Bld: 106 mg/dL — ABNORMAL HIGH (ref 70–99)
Potassium: 3.3 mmol/L — ABNORMAL LOW (ref 3.5–5.1)
Sodium: 136 mmol/L (ref 135–145)
Total Bilirubin: 1 mg/dL (ref 0.3–1.2)
Total Protein: 8.2 g/dL — ABNORMAL HIGH (ref 6.5–8.1)

## 2020-05-19 LAB — BASIC METABOLIC PANEL
BUN: 38 mg/dL — ABNORMAL HIGH (ref 6–23)
CO2: 28 mEq/L (ref 19–32)
Calcium: 9.4 mg/dL (ref 8.4–10.5)
Chloride: 99 mEq/L (ref 96–112)
Creatinine, Ser: 1.24 mg/dL (ref 0.40–1.50)
GFR: 55.48 mL/min — ABNORMAL LOW (ref >60.00)
Glucose, Bld: 106 mg/dL — ABNORMAL HIGH (ref 70–99)
Potassium: 3.2 mEq/L — ABNORMAL LOW (ref 3.5–5.1)
Sodium: 135 mEq/L (ref 135–145)

## 2020-05-19 LAB — AMYLASE: Amylase: 41 U/L (ref 27–131)

## 2020-05-19 LAB — CBC
HCT: 33.9 % — ABNORMAL LOW (ref 39.0–52.0)
Hemoglobin: 11.6 g/dL — ABNORMAL LOW (ref 13.0–17.0)
MCH: 31.9 pg (ref 26.0–34.0)
MCHC: 34.2 g/dL (ref 30.0–36.0)
MCV: 93.1 fL (ref 80.0–100.0)
Platelets: 344 10*3/uL (ref 150–400)
RBC: 3.64 MIL/uL — ABNORMAL LOW (ref 4.22–5.81)
RDW: 14.6 % (ref 11.5–15.5)
WBC: 11.1 10*3/uL — ABNORMAL HIGH (ref 4.0–10.5)
nRBC: 0 % (ref 0.0–0.2)

## 2020-05-19 LAB — LIPASE: Lipase: 38 U/L (ref 11.0–59.0)

## 2020-05-19 LAB — POC OCCULT BLOOD, ED: Fecal Occult Bld: POSITIVE — AB

## 2020-05-19 MED ORDER — OXYCODONE-ACETAMINOPHEN 5-325 MG PO TABS
2.0000 | ORAL_TABLET | Freq: Once | ORAL | Status: AC
  Administered 2020-05-19: 2 via ORAL
  Filled 2020-05-19: qty 2

## 2020-05-19 NOTE — Telephone Encounter (Signed)
He needs to be seen

## 2020-05-19 NOTE — ED Triage Notes (Signed)
Patient states that he has been having tarry stools and unable to state what color his emesis was yesterday. Patient was sent to the ED for a transfusion, but Hgb today was 11.3. patient states his iron is low also.

## 2020-05-19 NOTE — Patient Instructions (Signed)
Abdominal Pain, Adult Pain in the abdomen (abdominal pain) can be caused by many things. Often, abdominal pain is not serious and it gets better with no treatment or by being treated at home. However, sometimes abdominal pain is serious. Your health care provider will ask questions about your medical history and do a physical exam to try to determine the cause of your abdominal pain. Follow these instructions at home: Medicines  Take over-the-counter and prescription medicines only as told by your health care provider.  Do not take a laxative unless told by your health care provider. General instructions  Watch your condition for any changes.  Drink enough fluid to keep your urine pale yellow.  Keep all follow-up visits as told by your health care provider. This is important.   Contact a health care provider if:  Your abdominal pain changes or gets worse.  You are not hungry or you lose weight without trying.  You are constipated or have diarrhea for more than 2-3 days.  You have pain when you urinate or have a bowel movement.  Your abdominal pain wakes you up at night.  Your pain gets worse with meals, after eating, or with certain foods.  You are vomiting and cannot keep anything down.  You have a fever.  You have blood in your urine. Get help right away if:  Your pain does not go away as soon as your health care provider told you to expect.  You cannot stop vomiting.  Your pain is only in areas of the abdomen, such as the right side or the left lower portion of the abdomen. Pain on the right side could be caused by appendicitis.  You have bloody or black stools, or stools that look like tar.  You have severe pain, cramping, or bloating in your abdomen.  You have signs of dehydration, such as: ? Dark urine, very little urine, or no urine. ? Cracked lips. ? Dry mouth. ? Sunken eyes. ? Sleepiness. ? Weakness.  You have trouble breathing or chest  pain. Summary  Often, abdominal pain is not serious and it gets better with no treatment or by being treated at home. However, sometimes abdominal pain is serious.  Watch your condition for any changes.  Take over-the-counter and prescription medicines only as told by your health care provider.  Contact a health care provider if your abdominal pain changes or gets worse.  Get help right away if you have severe pain, cramping, or bloating in your abdomen. This information is not intended to replace advice given to you by your health care provider. Make sure you discuss any questions you have with your health care provider. Document Revised: 06/12/2019 Document Reviewed: 09/01/2018 Elsevier Patient Education  [employer].

## 2020-05-19 NOTE — ED Provider Notes (Signed)
[facility] DEPT Provider Note   CSN: [id number] Arrival date & time: 05/19/20  1714     History Chief Complaint  Patient presents with  . Hematemesis  . Rectal Bleeding    Adrian Miller is a 80 y.o. male with a history of duodenal ulcer hemorrhage, esophagitis, anemia, hyperlipidemia, esophageal stricture, PAD, chronic back pain on chronic opioids, and HTN who presents to the emergency department from his PCPs office with a chief complaint melena.  The patient reports that he has had black, tarry stools intermittently for the last 3 to 4 years.  The patient states that this began after having a hernia repair on his abdomen.  He reports that he has had more frequent melanotic stools over the last 2 weeks.  He reports a recent 6-day history of constipation that resolved with MiraLAX 4 days ago.  He reports gradually worsening weakness over the last few weeks.  Approximately 4 days ago, the patient was walking out of his bathroom when he fell forward from standing to the ground.  He denies hitting his head.  No LOC.  After the fall, he had multiple episodes of hematemesis.  Hematemesis persisted intermittently over the next 24 hours, but has since resolved.  He also states that 3 days ago he began having multiple episodes of loose, melanotic bowel movements over a period of about 24 hours, but this is since resolved.  However, patient has become increasingly dizzy and lightheaded with ambulation since this episode, but notes that he has been somewhat lightheaded for the last few weeks.  He is also endorsing bilateral lower abdominal pain for several days.  He is unable to characterize the pain.  No known aggravating or alleviating factors.  No fever, chills, dysuria, hematuria, chest pain, shortness of breath, headache, numbness, or weakness.  He was seen at his PCPs office today for the same symptoms and had a repeat H/H there was a significant decrease from  previous and he was advised to go to the emergency department for a blood transfusion.   The history is provided by the patient and medical records. No language interpreter was used.       Past Medical History:  Diagnosis Date  . Acute esophagitis 06/01/2015  . Benign essential HTN 05/31/2015  . Duodenal ulcer hemorrhage   . Esophageal stricture 06/01/2015  . Hiatal hernia 06/01/2015    Patient Active Problem List   Diagnosis Date Noted  . GI bleeding 05/20/2020  . Hypokalemia 05/20/2020  . Chronic pain 05/20/2020  . Gastrointestinal hemorrhage 05/19/2020  . Routine general medical examination at a health care facility 10/25/2019  . Bilateral recurrent inguinal hernia without obstruction or gangrene 10/22/2019  . PAD (peripheral artery disease) ([place]) 01/22/2018  . Long-term current use of opiate analgesic 10/17/2017  . CKD (chronic kidney disease), stage II 09/29/2017  . Encounter for long-term use of opiate analgesic 08/09/2017  . Bradycardia 11/26/2016  . Left lumbar radiculopathy 01/18/2016  . Left carpal tunnel syndrome 12/28/2015  . Hyperlipidemia with target LDL less than 130 12/26/2015  . GERD (gastroesophageal reflux disease) 12/19/2015  . Tobacco abuse counseling 12/19/2015  . Prediabetes 06/14/2015  . Generalized OA 06/14/2015  . B12 deficiency anemia 06/14/2015  . Other iron deficiency anemias 06/14/2015  . Esophageal stricture 06/01/2015  . Benign essential HTN 05/31/2015  . Duodenal ulcer hemorrhage     Past Surgical History:  Procedure Laterality Date  . CHOLECYSTECTOMY N/A 09/30/2017   Procedure: LAPAROSCOPIC CHOLECYSTECTOMY WITH INTRAOPERATIVE  CHOLANGIOGRAM;  Surgeon: Excell Seltzer, MD;  Location: [facility];  Service: General;  Laterality: N/A;  . ESOPHAGOGASTRODUODENOSCOPY N/A 05/31/2015   Procedure: ESOPHAGOGASTRODUODENOSCOPY (EGD);  Surgeon: Ladene Artist, MD;  Location: [facility];  Service: Endoscopy;  Laterality: N/A;  . HERNIA REPAIR  1975  .  LEFT ROTATOR CUFF REPAIR  2002  . REPLACEMENT TOTAL KNEE Left   . REVERSE SHOULDER ARTHROPLASTY Right 03/20/2018   Procedure: RIGHT REVERSE SHOULDER ARTHROPLASTY;  Surgeon: Tania Ade, MD;  Location: [facility];  Service: Orthopedics;  Laterality: Right;  . TOTAL KNEE ARTHROPLASTY Right 02/22/2015   Procedure: TOTAL KNEE ARTHROPLASTY;  Surgeon: Melrose Nakayama, MD;  Location: [place];  Service: Orthopedics;  Laterality: Right;       Family History  Problem Relation Age of Onset  . Hypertension Mother   . Heart attack Sister     Social History   Tobacco Use  . Smoking status: Current Every Day Smoker    Packs/day: 0.50    Years: 51.00    Pack years: 25.50    Types: Cigarettes  . Smokeless tobacco: Former Systems developer    Quit date: 11/03/2014  Vaping Use  . Vaping Use: Never used  Substance Use Topics  . Alcohol use: No    Alcohol/week: 0.0 standard drinks  . Drug use: No    Home Medications Prior to Admission medications   Medication Sig Start Date End Date Taking? Authorizing Provider  acetaminophen (TYLENOL) 325 MG tablet Take 650 mg by mouth every 6 (six) hours as needed for mild pain.   Yes [provider]  chlorthalidone (HYGROTON) 25 MG tablet Take 1 tablet (25 mg total) by mouth daily. 01/22/20  Yes Janith Lima, MD  oxyCODONE-acetaminophen (PERCOCET) 10-325 MG tablet Take 1 tablet by mouth every 6 (six) hours as needed for pain. 04/25/20  Yes Janith Lima, MD    Allergies    Asa [aspirin]  Review of Systems   Review of Systems  Constitutional: Negative for appetite change, diaphoresis and fever.  HENT: Negative for congestion and sore throat.   Eyes: Negative for visual disturbance.  Respiratory: Negative for shortness of breath and wheezing.   Cardiovascular: Negative for chest pain, palpitations and leg swelling.  Gastrointestinal: Positive for abdominal pain, blood in stool, constipation, diarrhea and vomiting. Negative for rectal pain.   Genitourinary: Negative for dysuria.  Musculoskeletal: Negative for back pain, myalgias, neck pain and neck stiffness.  Skin: Negative for rash and wound.  Allergic/Immunologic: Negative for immunocompromised state.  Neurological: Positive for weakness and light-headedness. Negative for dizziness, seizures, syncope, numbness and headaches.  Psychiatric/Behavioral: Negative for confusion.    Physical Exam Updated Vital Signs BP 114/83 (BP Location: Right Arm)   Pulse 84   Temp 98.3 F (36.8 C) (Oral)   Resp 16   Ht 5\' 9"  (1.753 m)   Wt 81.6 kg   SpO2 100%   BMI 26.58 kg/m   Physical Exam Vitals and nursing note reviewed.  Constitutional:      General: He is not in acute distress.    Appearance: He is well-developed. He is diaphoretic. He is not ill-appearing or toxic-appearing.  HENT:     Head: Normocephalic.     Mouth/Throat:     Mouth: Mucous membranes are moist.  Eyes:     Conjunctiva/sclera: Conjunctivae normal.  Cardiovascular:     Rate and Rhythm: Normal rate and regular rhythm.     Pulses: Normal pulses.     Heart sounds: Normal heart  sounds. No murmur heard. No gallop.   Pulmonary:     Effort: Pulmonary effort is normal. No respiratory distress.     Breath sounds: No stridor. No wheezing, rhonchi or rales.  Chest:     Chest wall: No tenderness.  Abdominal:     General: There is no distension.     Palpations: Abdomen is soft. There is no mass.     Tenderness: There is abdominal tenderness. There is no right CVA tenderness, left CVA tenderness, guarding or rebound.     Hernia: No hernia is present.     Comments: Tender to palpation of the bilateral lower abdomen, left greater than right.  No rebound or guarding.  Normoactive bowel sounds.  No CVA tenderness bilaterally.  Genitourinary:    Comments: Chaperoned exam.  Nonthrombosed hemorrhoid noted.  Melanotic stool noted on DRE.  Normal rectal tone. Musculoskeletal:     Cervical back: Neck supple.     Right  lower leg: No edema.     Left lower leg: No edema.  Skin:    General: Skin is warm.     Capillary Refill: Capillary refill takes less than 2 seconds.     Coloration: Skin is not jaundiced.  Neurological:     Mental Status: He is alert.  Psychiatric:        Behavior: Behavior normal.     ED Results / Procedures / Treatments   Labs (all labs ordered are listed, but only abnormal results are displayed) Labs Reviewed  COMPREHENSIVE METABOLIC PANEL - Abnormal; Notable for the following components:      Result Value   Potassium 3.3 (*)    Glucose, Bld 106 (*)    BUN 39 (*)    Creatinine, Ser 1.29 (*)    Total Protein 8.2 (*)    AST 52 (*)    ALT 53 (*)    GFR, Estimated 56 (*)    All other components within normal limits  CBC - Abnormal; Notable for the following components:   WBC 11.1 (*)    RBC 3.64 (*)    Hemoglobin 11.6 (*)    HCT 33.9 (*)    All other components within normal limits  POC OCCULT BLOOD, ED - Abnormal; Notable for the following components:   Fecal Occult Bld POSITIVE (*)    All other components within normal limits  SARS CORONAVIRUS 2 (TAT 6-24 HRS)  PROTIME-INR  VITAMIN B12  FOLATE  IRON AND TIBC  FERRITIN  RETICULOCYTES  POC OCCULT BLOOD, ED  TYPE AND SCREEN    EKG None  Radiology No results found.  Procedures .Critical Care Performed by: Joanne Gavel, PA-C Authorized by: Joanne Gavel, PA-C   Critical care provider statement:    Critical care time (minutes):  35   Critical care time was exclusive of:  Separately billable procedures and treating other patients and teaching time   Critical care was necessary to treat or prevent imminent or life-threatening deterioration of the following conditions: GI bleed.   Critical care was time spent personally by me on the following activities:  Ordering and performing treatments and interventions, ordering and review of laboratory studies, ordering and review of radiographic studies, pulse  oximetry, re-evaluation of patient's condition, review of old charts, development of treatment plan with patient or surrogate, obtaining history from patient or surrogate, examination of patient and evaluation of patient's response to treatment   I assumed direction of critical care for this patient from another provider in my  specialty: no     Care discussed with: admitting provider     (including critical care time)  Medications Ordered in ED Medications  sodium chloride 0.9 % bolus 1,000 mL (1,000 mLs Intravenous New Bag/Given (Non-Interop) 05/20/20 0107)    And  0.9 %  sodium chloride infusion ( Intravenous New Bag/Given 05/20/20 0131)  pantoprazole (PROTONIX) 80 mg in sodium chloride 0.9 % 100 mL (0.8 mg/mL) infusion (8 mg/hr Intravenous New Bag/Given 05/20/20 0131)  oxyCODONE-acetaminophen (PERCOCET/ROXICET) 5-325 MG per tablet 2 tablet (2 tablets Oral Given 05/19/20 2354)  pantoprazole (PROTONIX) 80 mg in sodium chloride 0.9 % 100 mL IVPB (0 mg Intravenous Stopped 05/20/20 0133)    ED Course  I have reviewed the triage vital signs and the nursing notes.  Pertinent labs & imaging results that were available during my care of the patient were reviewed by me and considered in my medical decision making (see chart for details).  Clinical Course as of 05/20/20 0143  Fri May 20, 2020  0102 Creatinine(!): 1.29 [MM]  0102 Fecal Occult Blood, POC(!): POSITIVE [MM]  0102 Hemoglobin(!): 11.6 [MM]  0102 BUN(!): 39 [MM]    Clinical Course User Index [MM] Keslee Harrington, Laymond Purser, PA-C   MDM Rules/Calculators/A&P                          80 year old male with a history of duodenal ulcer hemorrhage, esophagitis, anemia, hyperlipidemia, esophageal stricture, PAD, chronic back pain on chronic opioids, and HTN who presents to the emergency department from his PCPs office for hematemesis, melena, abdominal pain, fall, lightheadedness, and generalized weakness.   The patient was seen and independently  evaluated by Dr. Christy Gentles, attending physician.   Vital signs are stable, but patient does have orthostatic hypotension.  Patient has melanotic stool on digital rectal exam.  Mild tenderness to palpation in the lower abdomen without peritoneal signs.  Labs and imaging have been reviewed and independently interpreted by me.  Patient has a 3 g drop of hemoglobin with H/H11.6/33.9, down from 14.9/44.2 in July. BUN is also elevated, suggestive of upper GI bleed.  Last EGD in 2017 with nonbleeding duodenal ulcers and esophagitis.  Patient has not followed by GI in the outpatient setting.   He does continue to endorse using BC powders, approximately once per week.  Last dose was 1 week ago.  Given that patient is having symptomatic anemia with positive orthostatic vital signs, he will require admission and likely repeat EGD.  Patient was started on Protonix and was given an IV fluid bolus in the ER.  He was given his home dose of his pain medications.  Doubt diverticulitis, UTI, esophageal varices bleed, bowel obstruction.  Attempted to use secure chat messaging to reach out to overnight on-call gastroenterologist, Dr. Cristina Gong, but messaging was not turned on.  Discussed this with Dr. Myna Hidalgo, hospitalist, who is excepted the patient for admission.  No indication for emergently contacting GI at this time.  Hospitalist team will contact GI.  The patient appears reasonably stabilized for admission considering the current resources, flow, and capabilities available in the ED at this time, and I doubt any other [facility] requiring further screening and/or treatment in the ED prior to admission.    Final Clinical Impression(s) / ED Diagnoses Final diagnoses:  Gastrointestinal hemorrhage, unspecified gastrointestinal hemorrhage type  Orthostatic hypotension  Symptomatic anemia    Rx / DC Orders ED Discharge Orders    None  Joline Maxcy A, PA-C 05/20/20 0143    Ripley Fraise, MD 05/20/20  [phone]

## 2020-05-19 NOTE — Progress Notes (Signed)
Subjective:  Patient ID: Adrian Miller, male    DOB: 1941/02/04  Age: 80 y.o. MRN: [id number]  CC: Abdominal Pain  This visit occurred during the SARS-CoV-2 public health emergency.  Safety protocols were in place, including screening questions prior to the visit, additional usage of staff PPE, and extensive cleaning of exam room while observing appropriate contact time as indicated for disinfecting solutions.    HPI Adrian Miller presents for f/up - He complains of a 6-day history of obstipation.  The night before this visit his daughter gave him a laxative and he finally had a bowel movement.  For the last 24 hours he has experienced abdominal pain, melena, nausea, vomiting of coffee-ground emesis, and weakness.  Outpatient Medications Prior to Visit  Medication Sig Dispense Refill  . atorvastatin (LIPITOR) 20 MG tablet TAKE 1 TABLET BY MOUTH EVERY DAY 90 tablet 1  . chlorthalidone (HYGROTON) 25 MG tablet Take 1 tablet (25 mg total) by mouth daily. 90 tablet 0  . ferrous sulfate 325 (65 FE) MG tablet Take 1 tablet (325 mg total) by mouth 2 (two) times daily with a meal. 180 tablet 1  . oxyCODONE-acetaminophen (PERCOCET) 10-325 MG tablet Take 1 tablet by mouth every 6 (six) hours as needed for pain. 100 tablet 0   No facility-administered medications prior to visit.    ROS Review of Systems  Constitutional: Negative.  Negative for chills, diaphoresis and fatigue.  HENT: Negative.   Eyes: Negative for visual disturbance.  Respiratory: Negative for cough, chest tightness, shortness of breath and wheezing.   Gastrointestinal: Positive for abdominal pain, blood in stool, constipation and nausea. Negative for abdominal distention, anal bleeding, diarrhea and rectal pain.  Endocrine: Negative.   Genitourinary: Negative.  Negative for difficulty urinating.  Musculoskeletal: Positive for arthralgias.  Skin: Positive for pallor.  Neurological: Positive for weakness. Negative for  light-headedness.  Hematological: Negative for adenopathy. Does not bruise/bleed easily.  Psychiatric/Behavioral: Negative.     Objective:  BP 118/76   Pulse 93   Temp 98.7 F (37.1 C) (Oral)   Resp 16   Ht 5\' 9"  (1.753 m)   SpO2 97%   BMI 27.17 kg/m   BP Readings from Last 3 Encounters:  05/19/20 118/76  04/25/20 122/68  11/16/19 122/83    Wt Readings from Last 3 Encounters:  04/25/20 184 lb (83.5 kg)  10/22/19 194 lb 4 oz (88.1 kg)  08/25/19 193 lb (87.5 kg)    Physical Exam Vitals reviewed.  Constitutional:      Appearance: Normal appearance. He is ill-appearing.  HENT:     Nose: Nose normal.     Mouth/Throat:     Mouth: Mucous membranes are moist. Mucous membranes are pale.  Eyes:     General: No scleral icterus.    Conjunctiva/sclera: Conjunctivae normal.  Cardiovascular:     Rate and Rhythm: Normal rate and regular rhythm.     Heart sounds: No murmur heard.   Pulmonary:     Effort: Pulmonary effort is normal.     Breath sounds: No stridor. No wheezing, rhonchi or rales.  Abdominal:     General: Abdomen is flat. There is no distension.     Palpations: Abdomen is soft. There is no hepatomegaly, splenomegaly or mass.     Tenderness: There is generalized abdominal tenderness. There is no guarding or rebound.     Hernia: A hernia is present.  Genitourinary:    Prostate: Not enlarged, not tender and no nodules present.  Rectum: Guaiac result positive. External hemorrhoid and internal hemorrhoid present. No mass.  Musculoskeletal:        General: Normal range of motion.     Cervical back: Neck supple.     Right lower leg: No edema.  Skin:    General: Skin is warm and dry.     Coloration: Skin is pale.  Neurological:     General: No focal deficit present.     Mental Status: He is alert.  Psychiatric:        Mood and Affect: Mood normal.        Behavior: Behavior normal.     Lab Results  Component Value Date   WBC 10.7 (H) 05/19/2020   HGB  11.3 (L) 05/19/2020   HCT 33.4 (L) 05/19/2020   PLT 336.0 05/19/2020   GLUCOSE 106 (H) 05/19/2020   CHOL 162 10/22/2019   TRIG 301.0 (H) 10/22/2019   HDL 29.00 (L) 10/22/2019   LDLDIRECT 47.0 10/22/2019   LDLCALC 29 12/02/2018   ALT 35 11/15/2019   AST 24 11/15/2019   NA 135 05/19/2020   K 3.2 (L) 05/19/2020   CL 99 05/19/2020   CREATININE 1.24 05/19/2020   BUN 38 (H) 05/19/2020   CO2 28 05/19/2020   TSH 3.56 10/22/2019   PSA 0.87 03/18/2014   INR 1.02 03/13/2018   HGBA1C 5.8 04/25/2020    CT ABDOMEN PELVIS W CONTRAST  Result Date: 11/16/2019 CLINICAL DATA:  80 year old male with acute abdominal pain. EXAM: CT ABDOMEN AND PELVIS WITH CONTRAST TECHNIQUE: Multidetector CT imaging of the abdomen and pelvis was performed using the standard protocol following bolus administration of intravenous contrast. CONTRAST:  165mL OMNIPAQUE IOHEXOL 300 MG/ML  SOLN COMPARISON:  CT abdomen pelvis dated 08/20/2018. FINDINGS: Lower chest: The visualized lung bases are clear. No intra-abdominal free air or free fluid. Hepatobiliary: The liver is unremarkable. Mild intrahepatic biliary ductal dilatation. Cholecystectomy. No retained calcified stone in the central CBD. Pancreas: Unremarkable. No pancreatic ductal dilatation or surrounding inflammatory changes. Spleen: Normal in size without focal abnormality. Adrenals/Urinary Tract: The adrenal glands are unremarkable. There is no hydronephrosis on either side. There is symmetric enhancement and excretion of contrast by both kidneys. Multiple bilateral renal cysts measuring up to 5.5 cm on the left. The visualized ureters and urinary bladder appear unremarkable. Stomach/Bowel: There is severe sigmoid diverticulosis with muscular hypertrophy. No active inflammatory changes. There is no bowel obstruction or active inflammation. The appendix is normal. Vascular/Lymphatic: Moderate aortoiliac atherosclerotic disease. The IVC is unremarkable. No portal venous gas.  There is no adenopathy. Reproductive: The prostate and seminal vesicles are grossly unremarkable. Other: Probable small right anterolateral abdominal wall intramuscular lipoma. Musculoskeletal: Degenerative changes of the spine. No acute osseous pathology. Chronic calcification of the insertion of the hamstring on the left. IMPRESSION: 1. No acute intra-abdominal or pelvic pathology. 2. Severe sigmoid diverticulosis. No bowel obstruction. Normal appendix. 3. Aortic Atherosclerosis (ICD10-I70.0). Electronically Signed   By: Anner Crete M.D.   On: 11/16/2019 01:44    Assessment & Plan:   Pinchos was seen today for abdominal pain.  Diagnoses and all orders for this visit:  Gastrointestinal hemorrhage, unspecified gastrointestinal hemorrhage type- He has had a significant decrease in his H&H and has a prerenal azotemia.  I think he has a significant upper GI bleed.  I spoke to him and he agrees to go directly to the emergency department. -     CBC with Differential/Platelet; Future -     Amylase; Future -  Amylase -     CBC with Differential/Platelet  Benign essential HTN -     CBC with Differential/Platelet; Future -     Basic metabolic panel; Future -     Amylase; Future -     Amylase -     Basic metabolic panel -     CBC with Differential/Platelet  Generalized abdominal tenderness without rebound tenderness -     CBC with Differential/Platelet; Future -     Basic metabolic panel; Future -     Lipase; Future -     Amylase; Future -     Amylase -     Lipase -     Basic metabolic panel -     CBC with Differential/Platelet   I am having Graysin L. Strassner maintain his ferrous sulfate, atorvastatin, chlorthalidone, and oxyCODONE-acetaminophen.  No orders of the defined types were placed in this encounter.    Follow-up: Return if symptoms worsen or fail to improve.  Scarlette Calico, MD

## 2020-05-20 ENCOUNTER — Encounter (HOSPITAL_COMMUNITY)
Admission: EM | Disposition: A | Discharge status: Home / Self Care | Payer: Self-pay | Source: Home / Self Care | Attending: Emergency Medicine

## 2020-05-20 ENCOUNTER — Observation Stay (HOSPITAL_COMMUNITY): Payer: Medicare Other | Admitting: Anesthesiology

## 2020-05-20 ENCOUNTER — Encounter (HOSPITAL_COMMUNITY): Payer: Self-pay | Admitting: Family Medicine

## 2020-05-20 DIAGNOSIS — D649 Anemia, unspecified: Secondary | ICD-10-CM | POA: Diagnosis not present

## 2020-05-20 DIAGNOSIS — K2970 Gastritis, unspecified, without bleeding: Secondary | ICD-10-CM | POA: Diagnosis not present

## 2020-05-20 DIAGNOSIS — K2100 Gastro-esophageal reflux disease with esophagitis, without bleeding: Secondary | ICD-10-CM | POA: Diagnosis not present

## 2020-05-20 DIAGNOSIS — K449 Diaphragmatic hernia without obstruction or gangrene: Secondary | ICD-10-CM | POA: Diagnosis not present

## 2020-05-20 DIAGNOSIS — R55 Syncope and collapse: Secondary | ICD-10-CM

## 2020-05-20 DIAGNOSIS — E876 Hypokalemia: Secondary | ICD-10-CM | POA: Diagnosis present

## 2020-05-20 DIAGNOSIS — I10 Essential (primary) hypertension: Secondary | ICD-10-CM

## 2020-05-20 DIAGNOSIS — K921 Melena: Secondary | ICD-10-CM

## 2020-05-20 DIAGNOSIS — G8929 Other chronic pain: Secondary | ICD-10-CM | POA: Diagnosis not present

## 2020-05-20 DIAGNOSIS — K222 Esophageal obstruction: Secondary | ICD-10-CM | POA: Diagnosis not present

## 2020-05-20 DIAGNOSIS — K259 Gastric ulcer, unspecified as acute or chronic, without hemorrhage or perforation: Secondary | ICD-10-CM | POA: Diagnosis not present

## 2020-05-20 DIAGNOSIS — D62 Acute posthemorrhagic anemia: Secondary | ICD-10-CM

## 2020-05-20 DIAGNOSIS — K269 Duodenal ulcer, unspecified as acute or chronic, without hemorrhage or perforation: Secondary | ICD-10-CM

## 2020-05-20 DIAGNOSIS — K2101 Gastro-esophageal reflux disease with esophagitis, with bleeding: Secondary | ICD-10-CM | POA: Diagnosis not present

## 2020-05-20 DIAGNOSIS — K922 Gastrointestinal hemorrhage, unspecified: Secondary | ICD-10-CM

## 2020-05-20 DIAGNOSIS — N182 Chronic kidney disease, stage 2 (mild): Secondary | ICD-10-CM | POA: Diagnosis not present

## 2020-05-20 DIAGNOSIS — I951 Orthostatic hypotension: Secondary | ICD-10-CM | POA: Diagnosis not present

## 2020-05-20 DIAGNOSIS — Z20822 Contact with and (suspected) exposure to covid-19: Secondary | ICD-10-CM | POA: Diagnosis not present

## 2020-05-20 DIAGNOSIS — K274 Chronic or unspecified peptic ulcer, site unspecified, with hemorrhage: Secondary | ICD-10-CM | POA: Diagnosis not present

## 2020-05-20 HISTORY — PX: ESOPHAGOGASTRODUODENOSCOPY (EGD) WITH PROPOFOL: SHX5813

## 2020-05-20 HISTORY — PX: BIOPSY: SHX5522

## 2020-05-20 LAB — IRON AND TIBC
Iron: 62 ug/dL (ref 45–182)
Saturation Ratios: 16 % — ABNORMAL LOW (ref 17.9–39.5)
TIBC: 381 ug/dL (ref 250–450)
UIBC: 319 ug/dL

## 2020-05-20 LAB — HEPATITIS PANEL, ACUTE
HCV Ab: NONREACTIVE
Hep A IgM: NONREACTIVE
Hep B C IgM: NONREACTIVE
Hepatitis B Surface Ag: NONREACTIVE

## 2020-05-20 LAB — HEMOGLOBIN
Hemoglobin: 10.9 g/dL — ABNORMAL LOW (ref 13.0–17.0)
Hemoglobin: 7.6 g/dL — ABNORMAL LOW (ref 13.0–17.0)

## 2020-05-20 LAB — PROTIME-INR
INR: 1 (ref 0.8–1.2)
Prothrombin Time: 13.1 seconds (ref 11.4–15.2)

## 2020-05-20 LAB — COMPREHENSIVE METABOLIC PANEL
ALT: 76 U/L — ABNORMAL HIGH (ref 0–44)
AST: 71 U/L — ABNORMAL HIGH (ref 15–41)
Albumin: 3.6 g/dL (ref 3.5–5.0)
Alkaline Phosphatase: 71 U/L (ref 38–126)
Anion gap: 12 (ref 5–15)
BUN: 36 mg/dL — ABNORMAL HIGH (ref 8–23)
CO2: 24 mmol/L (ref 22–32)
Calcium: 8.4 mg/dL — ABNORMAL LOW (ref 8.9–10.3)
Chloride: 100 mmol/L (ref 98–111)
Creatinine, Ser: 1.39 mg/dL — ABNORMAL HIGH (ref 0.61–1.24)
GFR, Estimated: 52 mL/min — ABNORMAL LOW (ref >60)
Glucose, Bld: 124 mg/dL — ABNORMAL HIGH (ref 70–99)
Potassium: 3.5 mmol/L (ref 3.5–5.1)
Sodium: 136 mmol/L (ref 135–145)
Total Bilirubin: 0.9 mg/dL (ref 0.3–1.2)
Total Protein: 6.9 g/dL (ref 6.5–8.1)

## 2020-05-20 LAB — PREPARE RBC (CROSSMATCH)

## 2020-05-20 LAB — HEMATOCRIT
HCT: 32.4 % — ABNORMAL LOW (ref 39.0–52.0)
HCT: 22.4 % — ABNORMAL LOW (ref 39.0–52.0)

## 2020-05-20 LAB — VITAMIN B12: Vitamin B-12: 300 pg/mL (ref 180–914)

## 2020-05-20 LAB — FOLATE: Folate: 11.8 ng/mL (ref >5.9)

## 2020-05-20 LAB — SARS CORONAVIRUS 2 (TAT 6-24 HRS): SARS Coronavirus 2: NEGATIVE

## 2020-05-20 LAB — RETICULOCYTES
Immature Retic Fract: 14.1 % (ref 2.3–15.9)
RBC.: 3.44 MIL/uL — ABNORMAL LOW (ref 4.22–5.81)
Retic Count, Absolute: 80.5 10*3/uL (ref 19.0–186.0)
Retic Ct Pct: 2.3 % (ref 0.4–3.1)

## 2020-05-20 LAB — MAGNESIUM: Magnesium: 2 mg/dL (ref 1.7–2.4)

## 2020-05-20 LAB — FERRITIN: Ferritin: 50 ng/mL (ref 24–336)

## 2020-05-20 SURGERY — ESOPHAGOGASTRODUODENOSCOPY (EGD) WITH PROPOFOL
Anesthesia: Monitor Anesthesia Care

## 2020-05-20 MED ORDER — SODIUM CHLORIDE 0.9% IV SOLUTION: Freq: Once | INTRAVENOUS | Status: DC

## 2020-05-20 MED ORDER — LACTATED RINGERS IV SOLN: INTRAVENOUS | Status: DC | PRN

## 2020-05-20 MED ORDER — OXYCODONE-ACETAMINOPHEN 10-325 MG PO TABS: 1.0000 | ORAL_TABLET | Freq: Four times a day (QID) | ORAL | Status: DC | PRN

## 2020-05-20 MED ORDER — ACETAMINOPHEN 650 MG RE SUPP: 650.0000 mg | Freq: Four times a day (QID) | RECTAL | Status: DC | PRN

## 2020-05-20 MED ORDER — PROPOFOL 500 MG/50ML IV EMUL
INTRAVENOUS | Status: DC | PRN
  Administered 2020-05-20: 30 mg via INTRAVENOUS
  Administered 2020-05-20: 40 mg via INTRAVENOUS

## 2020-05-20 MED ORDER — PROPOFOL 500 MG/50ML IV EMUL
INTRAVENOUS | Status: DC | PRN
  Administered 2020-05-20: 125 ug/kg/min via INTRAVENOUS

## 2020-05-20 MED ORDER — SODIUM CHLORIDE 0.9 % IV SOLN: INTRAVENOUS | Status: DC

## 2020-05-20 MED ORDER — ONDANSETRON HCL 4 MG PO TABS: 4.0000 mg | ORAL_TABLET | Freq: Four times a day (QID) | ORAL | Status: DC | PRN

## 2020-05-20 MED ORDER — POTASSIUM CHLORIDE IN NACL 20-0.9 MEQ/L-% IV SOLN: INTRAVENOUS | Status: DC

## 2020-05-20 MED ORDER — SODIUM CHLORIDE 0.9 % IV SOLN
80.0000 mg | Freq: Once | INTRAVENOUS | Status: AC
  Administered 2020-05-20: 80 mg via INTRAVENOUS
  Filled 2020-05-20: qty 80

## 2020-05-20 MED ORDER — POTASSIUM CHLORIDE IN NACL 20-0.9 MEQ/L-% IV SOLN
INTRAVENOUS | Status: DC
  Filled 2020-05-20: qty 1000

## 2020-05-20 MED ORDER — LIDOCAINE HCL (CARDIAC) PF 100 MG/5ML IV SOSY
PREFILLED_SYRINGE | INTRAVENOUS | Status: DC | PRN
Start: 2020-05-20 — End: 2020-05-20
  Administered 2020-05-20: 80 mg via INTRAVENOUS

## 2020-05-20 MED ORDER — OXYCODONE-ACETAMINOPHEN 5-325 MG PO TABS
1.0000 | ORAL_TABLET | Freq: Four times a day (QID) | ORAL | Status: DC | PRN
Start: 2020-05-20 — End: 2020-05-21
  Administered 2020-05-20 – 2020-05-21 (×3): 1 via ORAL
  Filled 2020-05-20 (×3): qty 1

## 2020-05-20 MED ORDER — OXYCODONE HCL 5 MG PO TABS: 5.0000 mg | ORAL_TABLET | Freq: Four times a day (QID) | ORAL | Status: DC | PRN

## 2020-05-20 MED ORDER — ONDANSETRON HCL 4 MG/2ML IJ SOLN: 4.0000 mg | Freq: Four times a day (QID) | INTRAMUSCULAR | Status: DC | PRN

## 2020-05-20 MED ORDER — ACETAMINOPHEN 325 MG PO TABS: 650.0000 mg | ORAL_TABLET | Freq: Four times a day (QID) | ORAL | Status: DC | PRN

## 2020-05-20 MED ORDER — EPHEDRINE SULFATE 50 MG/ML IJ SOLN
INTRAMUSCULAR | Status: DC | PRN
Start: 2020-05-20 — End: 2020-05-20
  Administered 2020-05-20: 10 mg via INTRAVENOUS

## 2020-05-20 MED ORDER — SODIUM CHLORIDE 0.9 % IV SOLN: INTRAVENOUS | Status: AC

## 2020-05-20 MED ORDER — SODIUM CHLORIDE 0.9 % IV BOLUS
1000.0000 mL | Freq: Once | INTRAVENOUS | Status: AC
  Administered 2020-05-20: 1000 mL via INTRAVENOUS

## 2020-05-20 MED ORDER — PANTOPRAZOLE SODIUM 40 MG IV SOLR
40.0000 mg | Freq: Two times a day (BID) | INTRAVENOUS | Status: DC
  Administered 2020-05-20 – 2020-05-21 (×3): 40 mg via INTRAVENOUS
  Filled 2020-05-20 (×3): qty 40

## 2020-05-20 MED ORDER — SODIUM CHLORIDE 0.9 % IV SOLN
8.0000 mg/h | INTRAVENOUS | Status: DC
  Administered 2020-05-20 (×2): 8 mg/h via INTRAVENOUS
  Filled 2020-05-20 (×3): qty 80

## 2020-05-20 SURGICAL SUPPLY — 14 items

## 2020-05-20 NOTE — Transfer of Care (Signed)
Immediate Anesthesia Transfer of Care Note  Patient: Adrian Miller  Procedure(s) Performed: Procedure(s): ESOPHAGOGASTRODUODENOSCOPY (EGD) WITH PROPOFOL (N/A) BIOPSY  Patient Location: PACU  Anesthesia Type:MAC  Level of Consciousness:  sedated, patient cooperative and responds to stimulation  Airway & Oxygen Therapy:Patient Spontanous Breathing and Patient connected to face mask oxgen  Post-op Assessment:  Report given to PACU RN and Post -op Vital signs reviewed and stable  Post vital signs:  Reviewed and stable  Last Vitals:  Vitals:   05/20/20 1030 05/20/20 1239  BP: 113/70 (!) 145/71  Pulse: 70 (!) 59  Resp: 17 11  Temp:  36.7 C  SpO2: 96% 78%    Complications: No apparent anesthesia complications

## 2020-05-20 NOTE — Anesthesia Postprocedure Evaluation (Signed)
Anesthesia Post Note  Patient: Adrian Miller  Procedure(s) Performed: ESOPHAGOGASTRODUODENOSCOPY (EGD) WITH PROPOFOL (N/A ) BIOPSY     Patient location during evaluation: Endoscopy Anesthesia Type: MAC Level of consciousness: awake and alert Pain management: pain level controlled Vital Signs Assessment: post-procedure vital signs reviewed and stable Respiratory status: spontaneous breathing, nonlabored ventilation, respiratory function stable and patient connected to nasal cannula oxygen Cardiovascular status: stable and blood pressure returned to baseline Postop Assessment: no apparent nausea or vomiting Anesthetic complications: no   No complications documented.  Last Vitals:  Vitals:   05/20/20 1403 05/20/20 1444  BP: (!) 108/52 116/70  Pulse: (!) 58 (!) 57  Resp: 14 15  Temp:    SpO2: 100% 98%    Last Pain:  Vitals:   05/20/20 1335  TempSrc: Axillary  PainSc: 0-No pain                 Belenda Cruise P Meliss Fleek

## 2020-05-20 NOTE — ED Notes (Signed)
Pt to endoscopy.

## 2020-05-20 NOTE — ED Notes (Signed)
Informed consent for EGD bedside

## 2020-05-20 NOTE — H&P (View-Only) (Signed)
Referring Provider: Dr. Dessa Phi  Primary Care Physician:  Janith Lima, MD Primary Gastroenterologist:  Dr. Fuller Plan   Reason for Consultation: Upper GI/melnea   HPI: Adrian Miller is a 80 y.o. male with a past medical history of arthritis, hypertension, grade 2 diastolic dysfunction LV EF 60 - 65%, renal insufficiency, esophagitis, duodenal ulcers and UGI bleed secondary to NSAID use in 2017. S/P cholecystectomy 09/30/2017. Past hernia repair and left total knee replacement surgery.  He presented to [facility] ED on 05/19/2020 due to having lightheadedness, near-syncope and melena.  He reported having constipation for 1 week for which she took 1 dose of MiraLAX Wednesday 1/12. Later the same day, he developed  generalized abdominal pain, nausea, vomiting and passing dark tarry stools.  He stated his first bowel movement was black and hard as a rock which he manually removed from the rectum, 3-4 subsequent stools were loose black and tarry.  No bright red rectal bleeding.  He did not look at the color of his emesis, however, his daughter stated his emesis was the color of red wine. No Pepto-Bismol use.  He reported taking 1 BC powder packet 2 to 3 weeks ago due to having back pain and a headache.  He typically avoids all NSAIDs and usually takes Tylenol for aches and pains.  He took 1 iron tablet on Wed 1/12 after he saw the black stool.  No further BM/melena or hematemesis for the past 2 days.  He denies alcohol use.  He denies taking any PPI for a long time.  He smokes 1 pack of cigarettes daily  for more than 60 years.  No drug use.  He denies ever having a screening colonoscopy.  No family history of esophageal, gastric or colorectal cancer.  No fever, sweats or chills.  He reports losing a lot of weight over the past year, is unclear how much weight he has lost.  Prior history of LFTs associated with gallstones, he underwent a cholecystectomy 09/30/2017.  He has a history of upper GI  bleed with melena admitted to the hospital 05/2015.  He underwent an EGD by Dr. Fuller Plan 05/31/2015 which showed 3 nonbleeding ulcers in the duodenal bulb, esophagitis, a stricture at the GE junction and a small hiatal hernia.  H. pylori antibody was negative.  He was advised to avoid all NSAID use and to continue PPI p.o. twice daily for 2 weeks then once daily long-term.   ED course: Sodium 135.  Potassium 3.2.  Glucose 106.  BUN 38.  Creatinine 1.24.  WBC 10.7.  Hemoglobin 11.3.  Hematocrit 33.4.  MCV 91.5.  Platelet 336.  Repeat laboratory studies in the ED 05/19/2020: Sodium 136.  Potassium 3.3.  Glucose 106.  BUN 39.  Creatinine 1.29.  Lipase 38.  AST 52.  ALT 53.  Total bili 1.0.  WBC 11.1.  Hemoglobin 11.6.  Hematocrit 33.9.  Platelet 344.  SARS coronavirus 2 test pending.  FOBT positive.  ADDENDUM: [facility] Coronavirus 2 result: Negative   EGD 05/31/2015 by Dr. Fuller Plan: 1. Three ulcers in the duodenal bulb 2. LA Class B esophagitis 3. Stricture at the gastroesophageal junction 4. Small hiatal hernia  Past Medical History:  Diagnosis Date  . Acute esophagitis 06/01/2015  . Benign essential HTN 05/31/2015  . Duodenal ulcer hemorrhage   . Esophageal stricture 06/01/2015  . Hiatal hernia 06/01/2015    Past Surgical History:  Procedure Laterality Date  . CHOLECYSTECTOMY N/A 09/30/2017   Procedure: LAPAROSCOPIC CHOLECYSTECTOMY  WITH INTRAOPERATIVE CHOLANGIOGRAM;  Surgeon: Excell Seltzer, MD;  Location: [facility];  Service: General;  Laterality: N/A;  . ESOPHAGOGASTRODUODENOSCOPY N/A 05/31/2015   Procedure: ESOPHAGOGASTRODUODENOSCOPY (EGD);  Surgeon: Ladene Artist, MD;  Location: [facility];  Service: Endoscopy;  Laterality: N/A;  . HERNIA REPAIR  1975  . LEFT ROTATOR CUFF REPAIR  2002  . REPLACEMENT TOTAL KNEE Left   . REVERSE SHOULDER ARTHROPLASTY Right 03/20/2018   Procedure: RIGHT REVERSE SHOULDER ARTHROPLASTY;  Surgeon: Tania Ade, MD;  Location: [place];  Service: Orthopedics;   Laterality: Right;  . TOTAL KNEE ARTHROPLASTY Right 02/22/2015   Procedure: TOTAL KNEE ARTHROPLASTY;  Surgeon: Melrose Nakayama, MD;  Location: [place];  Service: Orthopedics;  Laterality: Right;    Prior to Admission medications   Medication Sig Start Date End Date Taking? Authorizing Provider  acetaminophen (TYLENOL) 325 MG tablet Take 650 mg by mouth every 6 (six) hours as needed for mild pain.   Yes [provider]  chlorthalidone (HYGROTON) 25 MG tablet Take 1 tablet (25 mg total) by mouth daily. 01/22/20  Yes Janith Lima, MD  oxyCODONE-acetaminophen (PERCOCET) 10-325 MG tablet Take 1 tablet by mouth every 6 (six) hours as needed for pain. 04/25/20  Yes Janith Lima, MD    Current Facility-Administered Medications  Medication Dose Route Frequency Provider Last Rate Last Admin  . 0.9 %  sodium chloride infusion   Intravenous Continuous Vianne Bulls, MD 125 mL/hr at 05/20/20 0656 New Bag at 05/20/20 0656  . acetaminophen (TYLENOL) tablet 650 mg  650 mg Oral Q6H PRN Opyd, Ilene Qua, MD       Or  . acetaminophen (TYLENOL) suppository 650 mg  650 mg Rectal Q6H PRN Opyd, Ilene Qua, MD      . ondansetron (ZOFRAN) tablet 4 mg  4 mg Oral Q6H PRN Opyd, Ilene Qua, MD       Or  . ondansetron (ZOFRAN) injection 4 mg  4 mg Intravenous Q6H PRN Opyd, Ilene Qua, MD      . oxyCODONE-acetaminophen (PERCOCET/ROXICET) 5-325 MG per tablet 1 tablet  1 tablet Oral Q6H PRN Opyd, Ilene Qua, MD       And  . oxyCODONE (Oxy IR/ROXICODONE) immediate release tablet 5 mg  5 mg Oral Q6H PRN Opyd, Ilene Qua, MD      . pantoprazole (PROTONIX) 80 mg in sodium chloride 0.9 % 100 mL (0.8 mg/mL) infusion  8 mg/hr Intravenous Continuous Opyd, Ilene Qua, MD 10 mL/hr at 05/20/20 0131 8 mg/hr at 05/20/20 0131   Current Outpatient Medications  Medication Sig Dispense Refill  . acetaminophen (TYLENOL) 325 MG tablet Take 650 mg by mouth every 6 (six) hours as needed for mild pain.    . chlorthalidone (HYGROTON) 25  MG tablet Take 1 tablet (25 mg total) by mouth daily. 90 tablet 0  . oxyCODONE-acetaminophen (PERCOCET) 10-325 MG tablet Take 1 tablet by mouth every 6 (six) hours as needed for pain. 100 tablet 0    Allergies as of 05/19/2020 - Review Complete 05/19/2020  Allergen Reaction Noted  . Asa [aspirin] Other (See Comments) 03/06/2013    Family History  Problem Relation Age of Onset  . Hypertension Mother   . Heart attack Sister     Social History   Socioeconomic History  . Marital status: Single    Spouse name: Not on file  . Number of children: Not on file  . Years of education: Not on file  . Highest education level: Not on file  Occupational History  . Not on file  Tobacco Use  . Smoking status: Current Every Day Smoker    Packs/day: 0.50    Years: 51.00    Pack years: 25.50    Types: Cigarettes  . Smokeless tobacco: Former Systems developer    Quit date: 11/03/2014  Vaping Use  . Vaping Use: Never used  Substance and Sexual Activity  . Alcohol use: No    Alcohol/week: 0.0 standard drinks  . Drug use: No  . Sexual activity: Never  Other Topics Concern  . Not on file  Social History Narrative  . Not on file   Social Determinants of Health   Financial Resource Strain: Not on file  Food Insecurity: Not on file  Transportation Needs: Not on file  Physical Activity: Not on file  Stress: Not on file  Social Connections: Not on file  Intimate Partner Violence: Not on file    Review of Systems: Gen: + Weight loss. CV: Denies chest pain, palpitations or edema. Resp: Denies cough, shortness of breath of hemoptysis.  GI: See HPI GU : Denies urinary burning, blood in urine, increased urinary frequency or incontinence. MS: + lower back pain.  Derm: Denies rash, itchiness, skin lesions or unhealing ulcers. Psych: Denies depression, anxiety or memory loss. Heme: Denies easy bruising, bleeding. Neuro:  + headaches, lightheaded.  Endo:  Denies any problems with DM, thyroid or adrenal  function.  Physical Exam: Vital signs in last 24 hours: Temp:  [98.3 F (36.8 C)-98.7 F (37.1 C)] 98.3 F (36.8 C) (01/13 1744) Pulse Rate:  [62-99] 62 (01/14 0500) Resp:  [14-18] 15 (01/14 0500) BP: (114-150)/(75-86) 132/75 (01/14 0500) SpO2:  [97 %-100 %] 98 % (01/14 0500) Weight:  [81.6 kg] 81.6 kg (01/13 1745)   General: Alert 80 year old male in no acute distress. Head:  Normocephalic and atraumatic. Eyes:  No scleral icterus. Conjunctiva pink. Ears:  Normal auditory acuity. Nose:  No deformity, discharge or lesions. Mouth: Numerous missing dentition no ulcers or lesions.  Neck:  Supple. No lymphadenopathy or thyromegaly.  Lungs: Breath sounds clear throughout. Heart: Regular rate and rhythm, no murmurs. Abdomen: Soft, nondistended.  Generalized tenderness throughout without rebound or guarding.  Positive bowel sounds to all 4 quadrants.  No HSM.  Right and left inguinal scars intact. Rectal: FOBT positive. Musculoskeletal:  Symmetrical without gross deformities.  Pulses:  Normal pulses noted. Extremities:  Without clubbing or edema.  Note, patient has a bundle of cash in his right sock (patient advised to secure his cash). neurologic:  Alert and  oriented x4. No focal deficits.  Skin:  Intact without significant lesions or rashes. Psych:  Alert and cooperative. Normal mood and affect.  Intake/Output from previous day: 01/13 0701 - 01/14 0700 In: 1000 [IV Piggyback:1000] Out: -  Intake/Output this shift: No intake/output data recorded.  Lab Results: Recent Labs    05/19/20 1436 05/19/20 1758 05/20/20 0148  WBC 10.7* 11.1*  --   HGB 11.3* 11.6* 10.9*  HCT 33.4* 33.9* 32.4*  PLT 336.0 344  --    BMET Recent Labs    05/19/20 1436 05/19/20 1758 05/20/20 0437  NA 135 136 136  K 3.2* 3.3* 3.5  CL 99 99 100  CO2 '28 27 24  ' GLUCOSE 106* 106* 124*  BUN 38* 39* 36*  CREATININE 1.24 1.29* 1.39*  CALCIUM 9.4 9.2 8.4*   LFT Recent Labs    05/20/20 0437   PROT 6.9  ALBUMIN 3.6  AST 71*  ALT 76*  ALKPHOS 71  BILITOT 0.9   PT/INR Recent Labs    05/20/20 0023  LABPROT 13.1  INR 1.0   Hepatitis Panel No results for input(s): HEPBSAG, HCVAB, HEPAIGM, HEPBIGM in the last 72 hours.    Studies/Results: No results found.  IMPRESSION/PLAN:  78. 80 year old male with a history of an upper GI bleed/melena secondary to duodenal ulcers in 2017 presents to the ED with near syncope, melena and anemia.  Hg 11.3 (base line Hg 14.9 on 11/15/2019)  Hg -> 10.9 ->7.6.  2 units PRBCs ordered by the hospitalist. Iron 62.  Ferritin 50.  TIBC 381.  Vitamin B12 300.  Folate 11.8. -NPO -Continue PPI infusion -EGD benefits and risks discussed including risk with sedation, risk of bleeding, perforation and infection . Dr. Bryan Lemma to verify timing of EGD.  -Colonoscopy at time of EGD or schedule as outpatient? Addendum: Patient to proceed with EGD at 1:30pm today. -Monitor H&H closely -Transfuse for hemoglobin less than 8 -Repeat H/H posttransfusion -IVF per the hospitalist -No NSAIDS  2.  Elevated LFTs.  AST 71.  ALT 76. Normal T. Bili and alk phos levels.  -Acute hepatitis panel  -Repeat hepatic panel in am -RUQ sono tomorrow if LFTs remain elevated   3. CKD. BUN 36. Cr. 1.24 -> 1.39. (base line Cr 1.16 on 04/25/2020).   4.  Near syncope, most likely due to dehydration and GI bleed. He received IV fluids. He is hemodynamically stable.     Patrecia Pour Kennedy-Smith  05/20/2020, 8:09 AM

## 2020-05-20 NOTE — Consult Note (Addendum)
Referring Provider: Dr. Dessa Phi  Primary Care Physician:  Janith Lima, MD Primary Gastroenterologist:  Dr. Fuller Plan   Reason for Consultation: Upper GI/melnea   HPI: Adrian Miller is a 80 y.o. male with a past medical history of arthritis, hypertension, grade Miller diastolic dysfunction LV EF 60 - 65%, renal insufficiency, esophagitis, duodenal ulcers and UGI bleed secondary to NSAID use in 2017. S/P cholecystectomy 09/30/2017. Past hernia repair and left total knee replacement surgery.  He presented to [facility] ED on 05/19/2020 due to having lightheadedness, near-syncope and melena.  He reported having constipation for 1 week for which she took 1 dose of MiraLAX Wednesday 1/12. Later the same day, he developed  generalized abdominal pain, nausea, vomiting and passing dark tarry stools.  He stated his first bowel movement was black and hard as a rock which he manually removed from the rectum, 3-4 subsequent stools were loose black and tarry.  No bright red rectal bleeding.  He did not look at the color of his emesis, however, his daughter stated his emesis was the color of red wine. No Pepto-Bismol use.  He reported taking 1 BC powder packet Miller to 3 weeks ago due to having back pain and a headache.  He typically avoids all NSAIDs and usually takes Tylenol for aches and pains.  He took 1 iron tablet on Wed 1/12 after he saw the black stool.  No further BM/melena or hematemesis for the past Miller days.  He denies alcohol use.  He denies taking any PPI for a long time.  He smokes 1 pack of cigarettes daily  for more than 60 years.  No drug use.  He denies ever having a screening colonoscopy.  No family history of esophageal, gastric or colorectal cancer.  No fever, sweats or chills.  He reports losing a lot of weight over the past year, is unclear how much weight he has lost.  Prior history of LFTs associated with gallstones, he underwent a cholecystectomy 09/30/2017.  He has a history of upper GI  bleed with melena admitted to the hospital 05/2015.  He underwent an EGD by Dr. Fuller Plan 05/31/2015 which showed 3 nonbleeding ulcers in the duodenal bulb, esophagitis, a stricture at the GE junction and a small hiatal hernia.  H. pylori antibody was negative.  He was advised to avoid all NSAID use and to continue PPI p.o. twice daily for Miller weeks then once daily long-term.   ED course: Sodium 135.  Potassium 3.Miller.  Glucose 106.  BUN 38.  Creatinine 1.24.  WBC 10.7.  Hemoglobin 11.3.  Hematocrit 33.4.  MCV 91.5.  Platelet 336.  Repeat laboratory studies in the ED 05/19/2020: Sodium 136.  Potassium 3.3.  Glucose 106.  BUN 39.  Creatinine 1.29.  Lipase 38.  AST 52.  ALT 53.  Total bili 1.0.  WBC 11.1.  Hemoglobin 11.6.  Hematocrit 33.9.  Platelet 344.  SARS coronavirus Miller test pending.  FOBT positive.  ADDENDUM: Adrian Miller result: Negative   EGD 05/31/2015 by Dr. Fuller Plan: 1. Three ulcers in the duodenal bulb Miller. LA Class B esophagitis 3. Stricture at the gastroesophageal junction 4. Small hiatal hernia  Past Medical History:  Diagnosis Date  . Acute esophagitis 06/01/2015  . Benign essential HTN 05/31/2015  . Duodenal ulcer hemorrhage   . Esophageal stricture 06/01/2015  . Hiatal hernia 06/01/2015    Past Surgical History:  Procedure Laterality Date  . CHOLECYSTECTOMY N/A 09/30/2017   Procedure: LAPAROSCOPIC CHOLECYSTECTOMY  WITH INTRAOPERATIVE CHOLANGIOGRAM;  Surgeon: Excell Seltzer, MD;  Location: [facility];  Service: General;  Laterality: N/A;  . ESOPHAGOGASTRODUODENOSCOPY N/A 05/31/2015   Procedure: ESOPHAGOGASTRODUODENOSCOPY (EGD);  Surgeon: Ladene Artist, MD;  Location: [facility];  Service: Endoscopy;  Laterality: N/A;  . HERNIA REPAIR  1975  . LEFT ROTATOR CUFF REPAIR  2002  . REPLACEMENT TOTAL KNEE Left   . REVERSE SHOULDER ARTHROPLASTY Right 03/20/2018   Procedure: RIGHT REVERSE SHOULDER ARTHROPLASTY;  Surgeon: Tania Ade, MD;  Location: [place];  Service: Orthopedics;   Laterality: Right;  . TOTAL KNEE ARTHROPLASTY Right 02/22/2015   Procedure: TOTAL KNEE ARTHROPLASTY;  Surgeon: Melrose Nakayama, MD;  Location: [place];  Service: Orthopedics;  Laterality: Right;    Prior to Admission medications   Medication Sig Start Date End Date Taking? Authorizing Provider  acetaminophen (TYLENOL) 325 MG tablet Take 650 mg by mouth every 6 (six) hours as needed for mild pain.   Yes [provider]  chlorthalidone (HYGROTON) 25 MG tablet Take 1 tablet (25 mg total) by mouth daily. 01/22/20  Yes Janith Lima, MD  oxyCODONE-acetaminophen (PERCOCET) 10-325 MG tablet Take 1 tablet by mouth every 6 (six) hours as needed for pain. 04/25/20  Yes Janith Lima, MD    Current Facility-Administered Medications  Medication Dose Route Frequency Provider Last Rate Last Admin  . 0.9 %  sodium chloride infusion   Intravenous Continuous Vianne Bulls, MD 125 mL/hr at 05/20/20 0656 New Bag at 05/20/20 0656  . acetaminophen (TYLENOL) tablet 650 mg  650 mg Oral Q6H PRN Opyd, Ilene Qua, MD       Or  . acetaminophen (TYLENOL) suppository 650 mg  650 mg Rectal Q6H PRN Opyd, Ilene Qua, MD      . ondansetron (ZOFRAN) tablet 4 mg  4 mg Oral Q6H PRN Opyd, Ilene Qua, MD       Or  . ondansetron (ZOFRAN) injection 4 mg  4 mg Intravenous Q6H PRN Opyd, Ilene Qua, MD      . oxyCODONE-acetaminophen (PERCOCET/ROXICET) 5-325 MG per tablet 1 tablet  1 tablet Oral Q6H PRN Opyd, Ilene Qua, MD       And  . oxyCODONE (Oxy IR/ROXICODONE) immediate release tablet 5 mg  5 mg Oral Q6H PRN Opyd, Ilene Qua, MD      . pantoprazole (PROTONIX) 80 mg in sodium chloride 0.9 % 100 mL (0.8 mg/mL) infusion  8 mg/hr Intravenous Continuous Opyd, Ilene Qua, MD 10 mL/hr at 05/20/20 0131 8 mg/hr at 05/20/20 0131   Current Outpatient Medications  Medication Sig Dispense Refill  . acetaminophen (TYLENOL) 325 MG tablet Take 650 mg by mouth every 6 (six) hours as needed for mild pain.    . chlorthalidone (HYGROTON) 25  MG tablet Take 1 tablet (25 mg total) by mouth daily. 90 tablet 0  . oxyCODONE-acetaminophen (PERCOCET) 10-325 MG tablet Take 1 tablet by mouth every 6 (six) hours as needed for pain. 100 tablet 0    Allergies as of 05/19/2020 - Review Complete 05/19/2020  Allergen Reaction Noted  . Asa [aspirin] Other (See Comments) 03/06/2013    Family History  Problem Relation Age of Onset  . Hypertension Mother   . Heart attack Sister     Social History   Socioeconomic History  . Marital status: Single    Spouse name: Not on file  . Number of children: Not on file  . Years of education: Not on file  . Highest education level: Not on file  Occupational History  . Not on file  Tobacco Use  . Smoking status: Current Every Day Smoker    Packs/day: 0.50    Years: 51.00    Pack years: 25.50    Types: Cigarettes  . Smokeless tobacco: Former Systems developer    Quit date: 11/03/2014  Vaping Use  . Vaping Use: Never used  Substance and Sexual Activity  . Alcohol use: No    Alcohol/week: 0.0 standard drinks  . Drug use: No  . Sexual activity: Never  Other Topics Concern  . Not on file  Social History Narrative  . Not on file   Social Determinants of Health   Financial Resource Strain: Not on file  Food Insecurity: Not on file  Transportation Needs: Not on file  Physical Activity: Not on file  Stress: Not on file  Social Connections: Not on file  Intimate Partner Violence: Not on file    Review of Systems: Gen: + Weight loss. CV: Denies chest pain, palpitations or edema. Resp: Denies cough, shortness of breath of hemoptysis.  GI: See HPI GU : Denies urinary burning, blood in urine, increased urinary frequency or incontinence. MS: + lower back pain.  Derm: Denies rash, itchiness, skin lesions or unhealing ulcers. Psych: Denies depression, anxiety or memory loss. Heme: Denies easy bruising, bleeding. Neuro:  + headaches, lightheaded.  Endo:  Denies any problems with DM, thyroid or adrenal  function.  Physical Exam: Vital signs in last 24 hours: Temp:  [98.3 F (36.8 C)-98.7 F (37.1 C)] 98.3 F (36.8 C) (01/13 1744) Pulse Rate:  [62-99] 62 (01/14 0500) Resp:  [14-18] 15 (01/14 0500) BP: (114-150)/(75-86) 132/75 (01/14 0500) SpO2:  [97 %-100 %] 98 % (01/14 0500) Weight:  [81.6 kg] 81.6 kg (01/13 1745)   General: Alert 80 year old male in no acute distress. Head:  Normocephalic and atraumatic. Eyes:  No scleral icterus. Conjunctiva pink. Ears:  Normal auditory acuity. Nose:  No deformity, discharge or lesions. Mouth: Numerous missing dentition no ulcers or lesions.  Neck:  Supple. No lymphadenopathy or thyromegaly.  Lungs: Breath sounds clear throughout. Heart: Regular rate and rhythm, no murmurs. Abdomen: Soft, nondistended.  Generalized tenderness throughout without rebound or guarding.  Positive bowel sounds to all 4 quadrants.  No HSM.  Right and left inguinal scars intact. Rectal: FOBT positive. Musculoskeletal:  Symmetrical without gross deformities.  Pulses:  Normal pulses noted. Extremities:  Without clubbing or edema.  Note, patient has a bundle of cash in his right sock (patient advised to secure his cash). neurologic:  Alert and  oriented x4. No focal deficits.  Skin:  Intact without significant lesions or rashes. Psych:  Alert and cooperative. Normal mood and affect.  Intake/Output from previous day: 01/13 0701 - 01/14 0700 In: 1000 [IV Piggyback:1000] Out: -  Intake/Output this shift: No intake/output data recorded.  Lab Results: Recent Labs    05/19/20 1436 05/19/20 1758 05/20/20 0148  WBC 10.7* 11.1*  --   HGB 11.3* 11.6* 10.9*  HCT 33.4* 33.9* 32.4*  PLT 336.0 344  --    BMET Recent Labs    05/19/20 1436 05/19/20 1758 05/20/20 0437  NA 135 136 136  K 3.Miller* 3.3* 3.5  CL 99 99 100  CO2 '28 27 24  ' GLUCOSE 106* 106* 124*  BUN 38* 39* 36*  CREATININE 1.24 1.29* 1.39*  CALCIUM 9.4 9.Miller 8.4*   LFT Recent Labs    05/20/20 0437   PROT 6.9  ALBUMIN 3.6  AST 71*  ALT 76*  ALKPHOS 71  BILITOT 0.9   PT/INR Recent Labs    05/20/20 0023  LABPROT 13.1  INR 1.0   Hepatitis Panel No results for input(s): HEPBSAG, HCVAB, HEPAIGM, HEPBIGM in the last 72 hours.    Studies/Results: No results found.  IMPRESSION/PLAN:  29. 80 year old male with a history of an upper GI bleed/melena secondary to duodenal ulcers in 2017 presents to the ED with near syncope, melena and anemia.  Hg 11.3 (base line Hg 14.9 on 11/15/2019)  Hg -> 10.9 ->7.6.  Miller units PRBCs ordered by the hospitalist. Iron 62.  Ferritin 50.  TIBC 381.  Vitamin B12 300.  Folate 11.8. -NPO -Continue PPI infusion -EGD benefits and risks discussed including risk with sedation, risk of bleeding, perforation and infection . Dr. Bryan Lemma to verify timing of EGD.  -Colonoscopy at time of EGD or schedule as outpatient? Addendum: Patient to proceed with EGD at 1:30pm today. -Monitor H&H closely -Transfuse for hemoglobin less than 8 -Repeat H/H posttransfusion -IVF per the hospitalist -No NSAIDS  Miller.  Elevated LFTs.  AST 71.  ALT 76. Normal T. Bili and alk phos levels.  -Acute hepatitis panel  -Repeat hepatic panel in am -RUQ sono tomorrow if LFTs remain elevated   3. CKD. BUN 36. Cr. 1.24 -> 1.39. (base line Cr 1.16 on 04/25/2020).   4.  Near syncope, most likely due to dehydration and GI bleed. He received IV fluids. He is hemodynamically stable.     Patrecia Pour Kennedy-Smith  05/20/2020, 8:09 AM

## 2020-05-20 NOTE — Op Note (Signed)
[facility] Patient Name: Adrian Miller Procedure Date: 05/20/2020 MRN: [id number] Attending MD: Gerrit Heck , MD Date of Birth: 1940-05-31 CSN: [id number] Age: 80 Admit Type: Outpatient Procedure:                Upper GI endoscopy Indications:              Acute post hemorrhagic anemia, Hematemesis, Melena                           80 year old male presents to ER with                            lightheadedness, near syncope, in the setting of                            melena and reported hematemesis. History of UGI B                            secondary to duodenal ulcers in the setting of                            NSAID use in 2017. EGD at that time with 3                            nonbleeding ulcers in the duodenal bulb, reflux                            esophagitis with stricture at the GE junction and                            small hiatal hernia. Was treated with high-dose                            PPI. Was no longer taking PPI at the time of                            admission. Did have BC powder last week for back                            pain, but otherwise denies regular NSAID use.                           Labs on admission notable for H/H 11.3/33.4                            (baseline hemoglobin ~14), BUN/creatinine 38/1.2                            (baseline ~16/1.2). Normal iron indices, B12,                            folate. FOBT positive. COVID-negative.  Hemoglobin has continued to downtrend, now 7.6. Providers:                Gerrit Heck, MD, Carmie End, RN, Elspeth Cho Tech., Technician, Arnoldo Hooker, CRNA Referring MD:              Medicines:                Monitored Anesthesia Care Complications:            No immediate complications. Estimated Blood Loss:     Estimated blood loss was minimal. Procedure:                Pre-Anesthesia Assessment:                           -  Prior to the procedure, a History and Physical                            was performed, and patient medications and                            allergies were reviewed. The patient's tolerance of                            previous anesthesia was also reviewed. The risks                            and benefits of the procedure and the sedation                            options and risks were discussed with the patient.                            All questions were answered, and informed consent                            was obtained. Prior Anticoagulants: The patient has                            taken no previous anticoagulant or antiplatelet                            agents. ASA Grade Assessment: II - A patient with                            mild systemic disease. After reviewing the risks                            and benefits, the patient was deemed in                            satisfactory condition to undergo the procedure.  After obtaining informed consent, the endoscope was                            passed under direct vision. Throughout the                            procedure, the patient's blood pressure, pulse, and                            oxygen saturations were monitored continuously. The                            [id number]-[id number]) Olympus gastroscope was                            introduced through the mouth, and advanced to the                            second part of duodenum. The upper GI endoscopy was                            accomplished without difficulty. The patient                            tolerated the procedure well. Scope In: Scope Out: Findings:      LA Grade B (one or more mucosal breaks greater than 5 mm, not extending       between the tops of two mucosal folds) esophagitis with no bleeding was       found in the lower third of the esophagus.      One benign-appearing, intrinsic mild stenosis was found in the lower        third of the esophagus. The stenosis was traversed.      A 2 cm sliding type hiatal hernia was present.      Two non-bleeding cratered gastric ulcers with no stigmata of bleeding       were found in the prepyloric region of the stomach. The largest lesion       was 3 mm in largest dimension. Biopsies were taken with a cold forceps       for Helicobacter pylori testing. Estimated blood loss was minimal.      The gastric fundus, gastric body and incisura were normal.      Two non-bleeding cratered duodenal ulcers with no stigmata of bleeding       were found in the duodenal bulb. The largest lesion was 6 mm in largest       dimension.      The second portion of the duodenum was normal. Impression:               - LA Grade B reflux esophagitis with no bleeding.                           - Benign-appearing esophageal stenosis.                           - 2 cm hiatal hernia.                           -  Non-bleeding gastric ulcers with no stigmata of                            bleeding. Biopsied.                           - Normal gastric fundus, gastric body and incisura.                           - Non-bleeding duodenal ulcers with no stigmata of                            bleeding.                           - Normal second portion of the duodenum.                           - Based on the endoscopic appearance of no high                            grade stigmata of bleeding and no active bleeding,                            no further endoscopic intervention was indicated in                            favor of continued medical management. Moderate Sedation:      Not Applicable - Patient had care per Anesthesia. Recommendation:           - Return patient to hospital ward for ongoing care.                           - Full liquid diet.                           - Use Protonix (pantoprazole) 40 mg IV BID for 24                            hours, then if no further bleeding, plan for                             Protonix (pantoprazole) 40 mg PO BID for 8 weeks.                           - pRBC transfusion now.                           - Post transfusion CBC and continue serial Hgb/Hct                            checks with additional blood products as needed per  protocol.                           - **No aspirin, ibuprofen, naproxen, BC Powder,                            Goody Powder, or other non-steroidal                            anti-inflammatory drugs.** Procedure Code(s):        --- Professional ---                           [phone], Esophagogastroduodenoscopy, flexible,                            transoral; with biopsy, single or multiple Diagnosis Code(s):        --- Professional ---                           K21.00, Gastro-esophageal reflux disease with                            esophagitis, without bleeding                           K22.2, Esophageal obstruction                           K44.9, Diaphragmatic hernia without obstruction or                            gangrene                           K25.9, Gastric ulcer, unspecified as acute or                            chronic, without hemorrhage or perforation                           K26.9, Duodenal ulcer, unspecified as acute or                            chronic, without hemorrhage or perforation                           D62, Acute posthemorrhagic anemia                           K92.0, Hematemesis                           K92.1, Melena (includes Hematochezia) CPT copyright 2019 American Medical Association. All rights reserved. The codes documented in this report are preliminary and upon coder review may  be revised to meet current compliance requirements. Gerrit Heck, MD 05/20/2020 1:45:48 PM Number of Addenda: 0

## 2020-05-20 NOTE — ED Notes (Signed)
Pt given meal tray.

## 2020-05-20 NOTE — ED Notes (Signed)
Updated daughter, Margarita Grizzle, by phone

## 2020-05-20 NOTE — ED Provider Notes (Signed)
Patient seen/examined in the Emergency Department in conjunction with Advanced Practice Provider McDonald Patient reports black/tarry stool and had some recent hematemesis.  He also reports abdominal discomfort Exam : awake/alert, appropriate, no distress, no focal ABD tenderness on my exam Plan: likely would benefit from admission and GI consultation    Ripley Fraise, MD 05/20/20 0030

## 2020-05-20 NOTE — Progress Notes (Signed)
  PROGRESS NOTE  Patient admitted earlier this morning. See H&P.   Adrian Miller is a 80 y.o. male with medical history significant for hypertension, mild renal insufficiency, arthritis with chronic pain, and history of peptic ulcer disease, now presenting to the emergency department for evaluation of lightheadedness, near syncope, and melena. Patient reports approximately 1 week of constipation for which he took a laxative and then developed abdominal pain, nausea, vomiting, and dark tarry stools on 05/15/2020. He did not see the vomitus but his daughter said it looked like wine. He had multiple episodes of vomiting that day, multiple loose tarry stools, and some lower abdominal pain that he attributes to a hernia and also some less intense upper abdominal pain. He has not had a bowel movement since 05/15/2020 and has not had any vomiting since then either but presents to the ED now due to fatigue and lightheadedness, particularly upon standing. He had an episode of near syncope upon standing that resulted in a fall but he denies losing consciousness or suffering any significant injury. Patient was admitted for GI bleeding.  GI was consulted.   Patient had upper GI bleeding in 2017, underwent EGD at that time with duodenal ulcers, negative H. pylori, and was instructed to start PPI and discontinue aspirin and avoid NSAIDs. Patient no longer takes the PPI, no longer takes daily aspirin, and rarely uses NSAIDs. He took a couple doses of Goody's powders 1 to 2 weeks ago due to severe arthritic pain.  Patient examined in the emergency department, feeling well overall, no bowel movement since Monday.  A/P:  Melena, acute GI bleeding with acute blood loss anemia, symptomatic anemia -Baseline hemoglobin is around 15 -Hold NSAIDs -GI consulted -IV PPI -Trend H&H, 2 unit packed red blood cells ordered -EGD planned for this afternoon  Hypertension -Hold chlorthalidone for now until able to take PO and  hemodynamically stable   Transaminitis -Trend LFT  CKD stage IIIa -Baseline creatinine 1.2 -Stable, monitor     Status is: Observation  The patient will require care spanning > 2 midnights and should be moved to inpatient because: Ongoing diagnostic testing needed not appropriate for outpatient work up and Inpatient level of care appropriate due to severity of illness  Dispo: The patient is from: Home              Anticipated d/c is to: Home              Anticipated d/c date is: 1 day              Patient currently is not medically stable to d/c.  EGD planned for today, transfuse blood today       Dessa Phi, DO [facility] 05/20/2020, 12:42 PM  Available via Epic secure chat 7am-7pm After these hours, please refer to coverage provider listed on [email or web address]

## 2020-05-20 NOTE — H&P (Signed)
History and Physical    ABDULLAHI VALLONE VZC:[id number] DOB: 1941-03-16 DOA: 05/19/2020  PCP: Janith Lima, MD   Patient coming from: Home   Chief Complaint: Melena, lightheadedness  HPI: Adrian Miller is a 80 y.o. male with medical history significant for hypertension, mild renal insufficiency, arthritis with chronic pain, and history of peptic ulcer disease, now presenting to the emergency department for evaluation of lightheadedness, near syncope, and melena. Patient reports approximately 1 week of constipation for which he took a laxative and then developed abdominal pain, nausea, vomiting, and dark tarry stools on 05/15/2020. He did not see the vomitus but his daughter said it looked like wine. He had multiple episodes of vomiting that day, multiple loose tarry stools, and some lower abdominal pain that he attributes to a hernia and also some less intense upper abdominal pain. He has not had a bowel movement since 05/15/2020 and has not had any vomiting since then either but presents to the ED now due to fatigue and lightheadedness, particularly upon standing. He had an episode of near syncope upon standing that resulted in a fall but he denies losing consciousness or suffering any significant injury.  Patient had upper GI bleeding in 2017, underwent EGD at that time with duodenal ulcers, negative H. pylori, and was instructed to start PPI and discontinue aspirin and avoid NSAIDs. Patient no longer takes the PPI, no longer takes daily aspirin, and rarely uses NSAIDs. He took a couple doses of Goody's powders 1 to 2 weeks ago due to severe arthritic pain.  ED Course: Upon arrival to the ED, patient is found to be afebrile, saturating well on room air, and with normal blood pressure and heart rate. There was 28 bpm increase in heart rate upon standing and 19 mmHg drop in SBP. Chemistry panel notable for slight elevation in transaminases, potassium 3.3, BUN 39, and creatinine 1.29. CBC notable for  hemoglobin of 11.6, down from 14.9 in July. Fecal occult blood testing is positive. Patient was given a liter of saline and started on IV Protonix. COVID-19 screening test not yet resulted.  Review of Systems:  All other systems reviewed and apart from HPI, are negative.  Past Medical History:  Diagnosis Date  . Acute esophagitis 06/01/2015  . Benign essential HTN 05/31/2015  . Duodenal ulcer hemorrhage   . Esophageal stricture 06/01/2015  . Hiatal hernia 06/01/2015    Past Surgical History:  Procedure Laterality Date  . CHOLECYSTECTOMY N/A 09/30/2017   Procedure: LAPAROSCOPIC CHOLECYSTECTOMY WITH INTRAOPERATIVE CHOLANGIOGRAM;  Surgeon: Excell Seltzer, MD;  Location: [facility];  Service: General;  Laterality: N/A;  . ESOPHAGOGASTRODUODENOSCOPY N/A 05/31/2015   Procedure: ESOPHAGOGASTRODUODENOSCOPY (EGD);  Surgeon: Ladene Artist, MD;  Location: [facility];  Service: Endoscopy;  Laterality: N/A;  . HERNIA REPAIR  1975  . LEFT ROTATOR CUFF REPAIR  2002  . REPLACEMENT TOTAL KNEE Left   . REVERSE SHOULDER ARTHROPLASTY Right 03/20/2018   Procedure: RIGHT REVERSE SHOULDER ARTHROPLASTY;  Surgeon: Tania Ade, MD;  Location: [facility];  Service: Orthopedics;  Laterality: Right;  . TOTAL KNEE ARTHROPLASTY Right 02/22/2015   Procedure: TOTAL KNEE ARTHROPLASTY;  Surgeon: Melrose Nakayama, MD;  Location: [facility];  Service: Orthopedics;  Laterality: Right;    Social History:   reports that he has been smoking cigarettes. He has a 25.50 pack-year smoking history. He quit smokeless tobacco use about 5 years ago. He reports that he does not drink alcohol and does not use drugs.  Allergies  Allergen Reactions  .  Asa [Aspirin] Other (See Comments)    Angioedema- patient is unaware of this (??)    Family History  Problem Relation Age of Onset  . Hypertension Mother   . Heart attack Sister      Prior to Admission medications   Medication Sig Start Date End Date Taking? Authorizing Provider   acetaminophen (TYLENOL) 325 MG tablet Take 650 mg by mouth every 6 (six) hours as needed for mild pain.   Yes [provider]  chlorthalidone (HYGROTON) 25 MG tablet Take 1 tablet (25 mg total) by mouth daily. 01/22/20  Yes Janith Lima, MD  oxyCODONE-acetaminophen (PERCOCET) 10-325 MG tablet Take 1 tablet by mouth every 6 (six) hours as needed for pain. 04/25/20  Yes Janith Lima, MD    Physical Exam: Vitals:   05/19/20 1859 05/19/20 2234 05/19/20 2355 05/20/20 0044  BP: 122/80 (!) 150/86 134/84 114/83  Pulse: 95 91 99 84  Resp: '18 18 14 16  ' Temp:      TempSrc:      SpO2: 99% 100% 97% 100%  Weight:      Height:        Constitutional: NAD, calm  Eyes: PERTLA, lids and conjunctivae normal ENMT: Mucous membranes are moist. Posterior pharynx clear of any exudate or lesions.   Neck: normal, supple, no masses, no thyromegaly Respiratory:   no wheezing, no crackles. No accessory muscle use.  Cardiovascular: S1 & S2 heard, regular rate and rhythm. No extremity edema.   Abdomen: No distension, no tenderness, soft. Bowel sounds active.  Musculoskeletal: no clubbing / cyanosis. No joint deformity upper and lower extremities.   Skin: no significant rashes, lesions, ulcers. Warm, dry, well-perfused. Neurologic: CN 2-12 grossly intact. Sensation intact. Moving all extremities.  Psychiatric: Alert and oriented to person, place, and situation. Very pleasant and cooperative.    Labs and Imaging on Admission: I have personally reviewed following labs and imaging studies  CBC: Recent Labs  Lab 05/19/20 1436 05/19/20 1758 05/20/20 0148  WBC 10.7* 11.1*  --   NEUTROABS 7.0  --   --   HGB 11.3* 11.6* 10.9*  HCT 33.4* 33.9* 32.4*  MCV 91.5 93.1  --   PLT 336.0 344  --    Basic Metabolic Panel: Recent Labs  Lab 05/19/20 1436 05/19/20 1758  NA 135 136  K 3.2* 3.3*  CL 99 99  CO2 28 27  GLUCOSE 106* 106*  BUN 38* 39*  CREATININE 1.24 1.29*  CALCIUM 9.4 9.2    GFR: Estimated Creatinine Clearance: 46.4 mL/min (A) (by C-G formula based on SCr of 1.29 mg/dL (H)). Liver Function Tests: Recent Labs  Lab 05/19/20 1758  AST 52*  ALT 53*  ALKPHOS 83  BILITOT 1.0  PROT 8.2*  ALBUMIN 4.5   Recent Labs  Lab 05/19/20 1436  LIPASE 38.0  AMYLASE 41   No results for input(s): AMMONIA in the last 168 hours. Coagulation Profile: No results for input(s): INR, PROTIME in the last 168 hours. Cardiac Enzymes: No results for input(s): CKTOTAL, CKMB, CKMBINDEX, TROPONINI in the last 168 hours. BNP (last 3 results) No results for input(s): PROBNP in the last 8760 hours. HbA1C: No results for input(s): HGBA1C in the last 72 hours. CBG: No results for input(s): GLUCAP in the last 168 hours. Lipid Profile: No results for input(s): CHOL, HDL, LDLCALC, TRIG, CHOLHDL, LDLDIRECT in the last 72 hours. Thyroid Function Tests: No results for input(s): TSH, T4TOTAL, FREET4, T3FREE, THYROIDAB in the last 72 hours. Anemia  Panel: Recent Labs    05/20/20 0025  RETICCTPCT 2.3   Urine analysis:    Component Value Date/Time   COLORURINE YELLOW 04/25/2020 1511   APPEARANCEUR CLEAR 04/25/2020 1511   LABSPEC 1.025 04/25/2020 1511   PHURINE 6.0 04/25/2020 1511   GLUCOSEU NEGATIVE 04/25/2020 1511   HGBUR NEGATIVE 04/25/2020 1511   BILIRUBINUR NEGATIVE 04/25/2020 1511   BILIRUBINUR small 02/05/2012 1931   KETONESUR NEGATIVE 04/25/2020 1511   PROTEINUR NEGATIVE 11/16/2019 0240   UROBILINOGEN 1.0 04/25/2020 1511   NITRITE NEGATIVE 04/25/2020 1511   LEUKOCYTESUR NEGATIVE 04/25/2020 1511   Sepsis Labs: '@LABRCNTIP' (procalcitonin:4,lacticidven:4) )No results found for this or any previous visit (from the past 240 hour(s)).   Radiological Exams on Admission: No results found.   Assessment/Plan  1. Upper GI bleeding  - Presents with lightheadedness after experiencing multiple episodes of hematemesis and loose dark stools on 05/15/20 and is found to be FOBT+  with 3 g drop in Hgb since July '21, and increased BUN  - He has hx of PUD attributed to NSAIDs, no longer takes PPI, and continues to use NSAIDs on occasion for severe pain  - He has been given IVF and started on Protonix infusion in ED  - Continue IVF hydration, continue IV PPI, monitor serial H&H, encourage strict NSAID avoidance, consult with GI   - Dr. Cristina Gong kindly reviewed the case, thinks EGD may be warranted and will pass on patient info to [facility] GI inpatient team this am  2. Near-syncope, orthostasis  - Presents with lightheadedness on standing, has 28 bpm increase in HR, 19 mmHg decrease in SBP, and 11 mmHg decrease in DBP on standing  - Likely related to recent N/V/D and blood-loss  - Continue IVF hydration, monitor H&H, repeat orthostatic vitals after fluid-resuscitation    3. Hypertension  - BP at goal, hold chlorthalidone while hydrating    4. Chronic pain  - No pain complaints on admission  - Continue Percocet as needed    5. Hypokalemia  - Potassium 3.3 in ED  - KCl added to IVF, repeat chem panel in am    6. Elevated transaminases  - Slight elevation in AST and ALT noted on admission with normal alk phos and bilirubin  - No RUQ tenderness, will trend    DVT prophylaxis: SCDs  Code Status: Full  Family Communication: Discussed with patient  Disposition Plan:  Patient is from: Home  Anticipated d/c is to: home  Anticipated d/c date is: 1/15 or 05/22/20 Patient currently: Pending serial H&H  Consults called: [facility] consulted     Admission status: Observation    Vianne Bulls, MD [facility]  05/20/2020, 2:07 AM

## 2020-05-20 NOTE — Interval H&P Note (Signed)
History and Physical Interval Note:  05/20/2020 1:37 PM  Adrian Miller  has presented today for surgery, with the diagnosis of UGI bleed, melena.  The various methods of treatment have been discussed with the patient and family. After consideration of risks, benefits and other options for treatment, the patient has consented to  Procedure(s): ESOPHAGOGASTRODUODENOSCOPY (EGD) WITH PROPOFOL (N/A) BIOPSY as a surgical intervention.  The patient's history has been reviewed, patient examined, no change in status, stable for surgery.  I have reviewed the patient's chart and labs.  Questions were answered to the patient's satisfaction.     Adrian Miller

## 2020-05-20 NOTE — Anesthesia Preprocedure Evaluation (Signed)
Anesthesia Evaluation  Patient identified by MRN, date of birth, ID band Patient awake    Reviewed: Allergy & Precautions, NPO status , Patient's Chart, lab work & pertinent test results  Airway Mallampati: II  TM Distance: >3 FB Neck ROM: Full    Dental  (+) Teeth Intact   Pulmonary Current Smoker,    Pulmonary exam normal        Cardiovascular hypertension, Pt. on medications  Rhythm:Regular Rate:Normal     Neuro/Psych negative psych ROS   GI/Hepatic hiatal hernia, PUD, GERD  Medicated,Duodenal ulcer, melena and near syncope   Endo/Other  negative endocrine ROS  Renal/GU   negative genitourinary   Musculoskeletal  (+) Arthritis , Osteoarthritis,    Abdominal (+)  Abdomen: soft. Bowel sounds: normal.  Peds  Hematology  (+) anemia ,   Anesthesia Other Findings   Reproductive/Obstetrics                             Anesthesia Physical Anesthesia Plan  ASA: II  Anesthesia Plan: MAC   Post-op Pain Management:    Induction: Intravenous  PONV Risk Score and Plan: Propofol infusion  Airway Management Planned: Simple Face Mask, Natural Airway and Nasal Cannula  Additional Equipment: None  Intra-op Plan:   Post-operative Plan:   Informed Consent: I have reviewed the patients History and Physical, chart, labs and discussed the procedure including the risks, benefits and alternatives for the proposed anesthesia with the patient or authorized representative who has indicated his/her understanding and acceptance.     Dental advisory given  Plan Discussed with: CRNA  Anesthesia Plan Comments: (Lab Results      Component                Value               Date                      WBC                      11.1 (H)            05/19/2020                HGB                      7.6 (L)             05/20/2020                HCT                      22.4 (L)            05/20/2020                 MCV                      93.1                05/19/2020                PLT                      344                 05/19/2020  Covid-19 Nucleic Acid Test Results Lab Results      Component                Value               Date                      SARSCOV2NAA              NEGATIVE            05/20/2020           Lab Results      Component                Value               Date                      NA                       136                 05/20/2020                K                        3.5                 05/20/2020                CO2                      24                  05/20/2020                GLUCOSE                  124 (H)             05/20/2020                BUN                      36 (H)              05/20/2020                CREATININE               1.39 (H)            05/20/2020                CALCIUM                  8.4 (L)             05/20/2020                GFRNONAA                 52 (L)              05/20/2020                GFRAA                    47 (L)  11/15/2019          )        Anesthesia Quick Evaluation

## 2020-05-21 DIAGNOSIS — K254 Chronic or unspecified gastric ulcer with hemorrhage: Secondary | ICD-10-CM | POA: Diagnosis not present

## 2020-05-21 DIAGNOSIS — N182 Chronic kidney disease, stage 2 (mild): Secondary | ICD-10-CM | POA: Diagnosis not present

## 2020-05-21 DIAGNOSIS — K259 Gastric ulcer, unspecified as acute or chronic, without hemorrhage or perforation: Secondary | ICD-10-CM | POA: Diagnosis not present

## 2020-05-21 DIAGNOSIS — D62 Acute posthemorrhagic anemia: Secondary | ICD-10-CM | POA: Diagnosis not present

## 2020-05-21 DIAGNOSIS — K922 Gastrointestinal hemorrhage, unspecified: Secondary | ICD-10-CM | POA: Diagnosis not present

## 2020-05-21 DIAGNOSIS — K269 Duodenal ulcer, unspecified as acute or chronic, without hemorrhage or perforation: Secondary | ICD-10-CM | POA: Diagnosis not present

## 2020-05-21 LAB — COMPREHENSIVE METABOLIC PANEL
ALT: 35 U/L (ref 0–44)
ALT: 67 U/L — ABNORMAL HIGH (ref 0–44)
AST: 25 U/L (ref 15–41)
AST: 44 U/L — ABNORMAL HIGH (ref 15–41)
Albumin: 1.7 g/dL — ABNORMAL LOW (ref 3.5–5.0)
Albumin: 3.5 g/dL (ref 3.5–5.0)
Alkaline Phosphatase: 38 U/L (ref 38–126)
Alkaline Phosphatase: 76 U/L (ref 38–126)
Anion gap: 4 — ABNORMAL LOW (ref 5–15)
Anion gap: 8 (ref 5–15)
BUN: 11 mg/dL (ref 8–23)
BUN: 17 mg/dL (ref 8–23)
CO2: 14 mmol/L — ABNORMAL LOW (ref 22–32)
CO2: 23 mmol/L (ref 22–32)
Calcium: 4.6 mg/dL — CL (ref 8.9–10.3)
Calcium: 8.6 mg/dL — ABNORMAL LOW (ref 8.9–10.3)
Chloride: 124 mmol/L — ABNORMAL HIGH (ref 98–111)
Chloride: 106 mmol/L (ref 98–111)
Creatinine, Ser: 0.47 mg/dL — ABNORMAL LOW (ref 0.61–1.24)
Creatinine, Ser: 1.06 mg/dL (ref 0.61–1.24)
GFR, Estimated: >60 mL/min (ref >60)
GFR, Estimated: >60 mL/min (ref >60)
Glucose, Bld: 63 mg/dL — ABNORMAL LOW (ref 70–99)
Glucose, Bld: 95 mg/dL (ref 70–99)
Potassium: 1.9 mmol/L — CL (ref 3.5–5.1)
Potassium: 3.3 mmol/L — ABNORMAL LOW (ref 3.5–5.1)
Sodium: 142 mmol/L (ref 135–145)
Sodium: 137 mmol/L (ref 135–145)
Total Bilirubin: 0.4 mg/dL (ref 0.3–1.2)
Total Bilirubin: 1.1 mg/dL (ref 0.3–1.2)
Total Protein: 3.5 g/dL — ABNORMAL LOW (ref 6.5–8.1)
Total Protein: 6.6 g/dL (ref 6.5–8.1)

## 2020-05-21 LAB — TYPE AND SCREEN
ABO/RH(D): O POS
Antibody Screen: NEGATIVE
Unit division: 0
Unit division: 0

## 2020-05-21 LAB — BPAM RBC
Blood Product Expiration Date: 202202162359
Blood Product Expiration Date: 202202162359
ISSUE DATE / TIME: 202201141732
ISSUE DATE / TIME: 202201142120
Unit Type and Rh: 5100
Unit Type and Rh: 5100

## 2020-05-21 LAB — CBC
HCT: 23.2 % — ABNORMAL LOW (ref 39.0–52.0)
Hemoglobin: 7.9 g/dL — ABNORMAL LOW (ref 13.0–17.0)
MCH: 31.5 pg (ref 26.0–34.0)
MCHC: 34.1 g/dL (ref 30.0–36.0)
MCV: 92.4 fL (ref 80.0–100.0)
Platelets: 204 10*3/uL (ref 150–400)
RBC: 2.51 MIL/uL — ABNORMAL LOW (ref 4.22–5.81)
RDW: 14.9 % (ref 11.5–15.5)
WBC: 7.7 10*3/uL (ref 4.0–10.5)
nRBC: 0 % (ref 0.0–0.2)

## 2020-05-21 LAB — HEMATOCRIT: HCT: 32 % — ABNORMAL LOW (ref 39.0–52.0)

## 2020-05-21 MED ORDER — POTASSIUM CHLORIDE CRYS ER 20 MEQ PO TBCR
40.0000 meq | EXTENDED_RELEASE_TABLET | Freq: Once | ORAL | Status: AC
  Administered 2020-05-21: 40 meq via ORAL
  Filled 2020-05-21: qty 2

## 2020-05-21 MED ORDER — PANTOPRAZOLE SODIUM 40 MG PO TBEC: 40.0000 mg | DELAYED_RELEASE_TABLET | Freq: Two times a day (BID) | ORAL | Status: DC

## 2020-05-21 MED ORDER — PANTOPRAZOLE SODIUM 40 MG PO TBEC: 40.0000 mg | DELAYED_RELEASE_TABLET | Freq: Two times a day (BID) | ORAL | 0 refills | Status: DC

## 2020-05-21 NOTE — Progress Notes (Signed)
[facility] ROUNDING NOTE   Subjective: EGD completed yesterday notable for gastric ulcer x2, duodenal ulcer x2, all clean base without high-grade stigmata of bleeding. Also notable for reflux esophagitis and peptic stricture. Transfused 2 unit PRBCs overnight. Otherwise no acute events overnight. Tolerating clear liquids without issue, and asking to advance diet and discharge home today. No further overt bleeding.  Repeat labs with downtrending BUN 36 --> 17, posttransfusion hematocrit 32 (from 23). Liver enzymes improving with AST/ALT 44/67 (from 71/76) with otherwise normal T bili and ALP. Acute hepatitis panel negative.   Objective: Vital signs in last 24 hours: Temp:  [97.6 F (36.4 C)-99 F (37.2 C)] 98.9 F (37.2 C) (01/15 0100) Pulse Rate:  [51-74] 54 (01/15 0800) Resp:  [11-20] 12 (01/15 0700) BP: (92-145)/(43-81) 108/72 (01/15 0800) SpO2:  [92 %-100 %] 96 % (01/15 0800)   General: NAD Lungs:  CTA b/l, no w/r/r Heart:  RRR, no m/r/g Abdomen:  Soft, NT, ND, +BS Ext:  No c/c/e    Intake/Output from previous day: No intake/output data recorded. Intake/Output this shift: No intake/output data recorded.   Lab Results: Recent Labs    05/19/20 1436 05/19/20 1758 05/20/20 0148 05/20/20 0948 05/21/20 0300  WBC 10.7* 11.1*  --   --  7.7  HGB 11.3* 11.6* 10.9* 7.6* 7.9*  PLT 336.0 344  --   --  204  MCV 91.5 93.1  --   --  92.4   BMET Recent Labs    05/20/20 0437 05/21/20 0300 05/21/20 0408  NA 136 142 137  K 3.5 1.9* 3.3*  CL 100 124* 106  CO2 24 14* 23  GLUCOSE 124* 63* 95  BUN 36* 11 17  CREATININE 1.39* 0.47* 1.06  CALCIUM 8.4* 4.6* 8.6*   LFT Recent Labs    05/20/20 0437 05/21/20 0300 05/21/20 0408  PROT 6.9 3.5* 6.6  ALBUMIN 3.6 1.7* 3.5  AST 71* 25 44*  ALT 76* 35 67*  ALKPHOS 71 38 76  BILITOT 0.9 0.4 1.1   PT/INR Recent Labs    05/20/20 0023  INR 1.0      Imaging/Other results: No results found.    Assessment  and Plan:  1) Gastric and duodenal ulcers 2) GERD with erosive esophagitis 3) Melena-resolved 4) Acute blood loss anemia 5) Peptic stricture 6) Elevated AST/ALT  - No further overt bleeding - Clean-based gastric/duodenal ulcers without high-grade stigmata. No endoscopic intervention indicated and instead treated with continued medical management - Change Protonix to 40 mg PO BID and continue for 8 weeks. At the end of 8 weeks, decrease to 40 mg/day for continued control of reflux esophagitis, and will eventually titrate to lowest effective dose as outpatient - Strongly counseled on avoidance of all NSAIDs - Okay to advance diet - Based on recurrent ulcers, erosive esophagitis, and strong patient preference, will plan for repeat EGD as outpatient in 6-8 weeks - Will arrange outpatient follow-up in the GI clinic - Repeat CBC in 7-10 days as outpatient to ensure returning to baseline - Repeat liver enzymes in 2- 4 weeks to again ensure returning to baseline normal - Okay to DC home today from a GI standpoint    Adrian Bullion, DO  05/21/2020, 9:09 AM [facility] Pager [phone]

## 2020-05-21 NOTE — Discharge Summary (Signed)
Physician Discharge Summary  Adrian Miller [id number] DOB: 1940-12-15 DOA: 05/19/2020  PCP: Janith Lima, MD  Admit date: 05/19/2020 Discharge date: 05/21/2020  Admitted From: Home Disposition:  Home  Recommendations for Outpatient Follow-up:  1. Follow up with PCP in 1 week 2. Follow up with GI  3. Please obtain CBC in 1 week to check stability of Hgb and obtain LFT in 2-4 weeks to ensure return to baseline 4. Repeat EGD in 6-8 weeks  Discharge Condition: Stable CODE STATUS: Full  Diet recommendation: Heart healthy   Brief/Interim Summary: Adrian Croucher Mooreis a 80 y.o.malewith medical history significant forhypertension, mild renal insufficiency, arthritis with chronic pain, and history of peptic ulcer disease, now presenting to the emergency department for evaluation of lightheadedness, near syncope, and melena. Patient reports approximately 1 week of constipation for which he took a laxative and then developed abdominal pain, nausea, vomiting, and dark tarry stools on 05/15/2020. He did not see the vomitus but his daughter said it looked like wine. He had multiple episodes of vomiting that day, multiple loose tarry stools, and some lower abdominal pain that he attributes to a hernia and also some less intense upper abdominal pain. He has not had a bowel movement since 05/15/2020 and has not had any vomiting since then either but presents to the ED now due to fatigue and lightheadedness, particularly upon standing. He had an episode of near syncope upon standing that resulted in a fall but he denies losing consciousness or suffering any significant injury. Patient was admitted for GI bleeding.  GI was consulted.   Patient had upper GI bleeding in 2017, underwent EGD at that time with duodenal ulcers, negative H. pylori, and was instructed to start PPI and discontinue aspirin and avoid NSAIDs. Patient no longer takes the PPI, no longer takes daily aspirin, and rarely uses NSAIDs. He took a  couple doses of Goody's powders 1 to 2 weeks ago due to severe arthritic pain.  He was seen by GI, underwent EGD which found gastric ulcer x2, duodenal ulcer x2, all clean base without high-grade stigmata of bleeding. Also notable for reflux esophagitis and peptic stricture. He received 2u pRBC transfusions. He was monitored overnight, deemed stable for discharge home. He was instructed to stop all NSAID, ibuprofen, GC powder, advil/motrin/aleve/aspirin.   Discharge Diagnoses:  Principal Problem:   GI bleeding Active Problems:   Acute blood loss anemia   Benign essential HTN   Melena   Peptic stricture of esophagus   Elevated liver enzymes   CKD (chronic kidney disease), stage II   Hypokalemia   Chronic pain   Near syncope   Symptomatic anemia   Multiple gastric ulcers   Multiple duodenal ulcers   Melena, acute GI bleeding with acute blood loss anemia, symptomatic anemia -Baseline hemoglobin is around 15 -Hold NSAIDs -GI consulted, s/p EGD which found gastric and duodenal ulcers  -S/p 2 unit packed red blood cells  -Protonix to 40 mg PO BID and continue for 8 weeks. At the end of 8 weeks, decrease to 40 mg QD   Hypertension -Resume chlorthalidone   Transaminitis -Trend LFT, improved   CKD stage IIIa -Baseline creatinine 1.2 -Stable, monitor   Hypokalemia -Replaced    Discharge Instructions  Discharge Instructions    Call MD for:  difficulty breathing, headache or visual disturbances   Complete by: As directed    Call MD for:  extreme fatigue   Complete by: As directed    Call MD for:  persistant dizziness or light-headedness   Complete by: As directed    Call MD for:  persistant nausea and vomiting   Complete by: As directed    Call MD for:  severe uncontrolled pain   Complete by: As directed    Call MD for:  temperature >100.4   Complete by: As directed    Discharge instructions   Complete by: As directed    You were cared for by a hospitalist during your  hospital stay. If you have any questions about your discharge medications or the care you received while you were in the hospital after you are discharged, you can call the unit and ask to speak with the hospitalist on call if the hospitalist that took care of you is not available. Once you are discharged, your primary care physician will handle any further medical issues. Please note that NO REFILLS for any discharge medications will be authorized once you are discharged, as it is imperative that you return to your primary care physician (or establish a relationship with a primary care physician if you do not have one) for your aftercare needs so that they can reassess your need for medications and monitor your lab values.   Increase activity slowly   Complete by: As directed      Allergies as of 05/21/2020      Reactions   Asa [aspirin] Other (See Comments)   Angioedema- patient is unaware of this (??)      Medication List    TAKE these medications   acetaminophen 325 MG tablet Commonly known as: TYLENOL Take 650 mg by mouth every 6 (six) hours as needed for mild pain.   chlorthalidone 25 MG tablet Commonly known as: HYGROTON Take 1 tablet (25 mg total) by mouth daily.   oxyCODONE-acetaminophen 10-325 MG tablet Commonly known as: PERCOCET Take 1 tablet by mouth every 6 (six) hours as needed for pain.   pantoprazole 40 MG tablet Commonly known as: PROTONIX Take 1 tablet (40 mg total) by mouth 2 (two) times daily.       Follow-up Information    Janith Lima, MD. Schedule an appointment as soon as possible for a visit in 1 week(s).   Specialty: Internal Medicine Why: Please obtain CBC in 1 week to check stability of Hgb and obtain LFT in 2-4 weeks to ensure return to baseline Contact information: [place] [address] [facility] Follow up.   Specialty: Gastroenterology Contact information: [place] [address] [phone]             Allergies  Allergen Reactions  . Asa [Aspirin] Other (See Comments)    Angioedema- patient is unaware of this (??)    Consultations:  GI   Procedures/Studies: No results found.  EGD 1/14     Discharge Exam: Vitals:   05/21/20 0700 05/21/20 0800  BP: 103/71 108/72  Pulse: (!) 54 (!) 54  Resp: 12   Temp:    SpO2: 95% 96%     General: Pt is alert, awake, not in acute distress Cardiovascular: RRR, S1/S2 +, no edema Respiratory: CTA bilaterally, no wheezing, no rhonchi, no respiratory distress, no conversational dyspnea  Abdominal: Soft, NT, ND, bowel sounds + Extremities: no edema, no cyanosis Psych: Normal mood and affect, stable judgement and insight     The results of significant diagnostics from this hospitalization (including imaging, microbiology, ancillary and laboratory) are  listed below for reference.     Microbiology: Recent Results (from the past 240 hour(s))  SARS CORONAVIRUS 2 (TAT 6-24 HRS) Nasopharyngeal Nasopharyngeal Swab     Status: None   Collection Time: 05/20/20  1:08 AM   Specimen: Nasopharyngeal Swab  Result Value Ref Range Status   SARS Coronavirus 2 NEGATIVE NEGATIVE Final    Comment: (NOTE) SARS-CoV-2 target nucleic acids are NOT DETECTED.  The SARS-CoV-2 RNA is generally detectable in upper and lower respiratory specimens during the acute phase of infection. Negative results do not preclude SARS-CoV-2 infection, do not rule out co-infections with other pathogens, and should not be used as the sole basis for treatment or other patient management decisions. Negative results must be combined with clinical observations, patient history, and epidemiological information. The expected result is Negative.  Fact Sheet for Patients: SugarRoll.be  Fact Sheet for Healthcare Providers: https://www.woods-mathews.com/  This test  is not yet approved or cleared by the [place] FDA and  has been authorized for detection and/or diagnosis of SARS-CoV-2 by FDA under an Emergency Use Authorization (EUA). This EUA will remain  in effect (meaning this test can be used) for the duration of the COVID-19 declaration under Se ction 564(b)(1) of the Act, 21 U.S.C. section 360bbb-3(b)(1), unless the authorization is terminated or revoked sooner.  Performed at [facility], [place] [address] [place], [place] [address]      Labs: BNP (last 3 results) No results for input(s): BNP in the last 8760 hours. Basic Metabolic Panel: Recent Labs  Lab 05/19/20 1436 05/19/20 1758 05/20/20 0437 05/21/20 0300 05/21/20 0408  NA 135 136 136 142 137  K 3.2* 3.3* 3.5 1.9* 3.3*  CL 99 99 100 124* 106  CO2 28 27 24  14* 23  GLUCOSE 106* 106* 124* 63* 95  BUN 38* 39* 36* 11 17  CREATININE 1.24 1.29* 1.39* 0.47* 1.06  CALCIUM 9.4 9.2 8.4* 4.6* 8.6*  MG  --   --  2.0  --   --    Liver Function Tests: Recent Labs  Lab 05/19/20 1758 05/20/20 0437 05/21/20 0300 05/21/20 0408  AST 52* 71* 25 44*  ALT 53* 76* 35 67*  ALKPHOS 83 71 38 76  BILITOT 1.0 0.9 0.4 1.1  PROT 8.2* 6.9 3.5* 6.6  ALBUMIN 4.5 3.6 1.7* 3.5   Recent Labs  Lab 05/19/20 1436  LIPASE 38.0  AMYLASE 41   No results for input(s): AMMONIA in the last 168 hours. CBC: Recent Labs  Lab 05/19/20 1436 05/19/20 1758 05/20/20 0148 05/20/20 0948 05/21/20 0300 05/21/20 0820  WBC 10.7* 11.1*  --   --  7.7  --   NEUTROABS 7.0  --   --   --   --   --   HGB 11.3* 11.6* 10.9* 7.6* 7.9*  --   HCT 33.4* 33.9* 32.4* 22.4* 23.2* 32.0*  MCV 91.5 93.1  --   --  92.4  --   PLT 336.0 344  --   --  204  --    Cardiac Enzymes: No results for input(s): CKTOTAL, CKMB, CKMBINDEX, TROPONINI in the last 168 hours. BNP: Invalid input(s): POCBNP CBG: No results for input(s): GLUCAP in the last 168 hours. D-Dimer No results for input(s): DDIMER in the last 72  hours. Hgb A1c No results for input(s): HGBA1C in the last 72 hours. Lipid Profile No results for input(s): CHOL, HDL, LDLCALC, TRIG, CHOLHDL, LDLDIRECT in the last 72 hours. Thyroid function studies No results for  input(s): TSH, T4TOTAL, T3FREE, THYROIDAB in the last 72 hours.  Invalid input(s): FREET3 Anemia work up Recent Labs    05/20/20 0025  VITAMINB12 300  FOLATE 11.8  FERRITIN 50  TIBC 381  IRON 62  RETICCTPCT 2.3   Urinalysis    Component Value Date/Time   COLORURINE YELLOW 04/25/2020 1511   APPEARANCEUR CLEAR 04/25/2020 1511   LABSPEC 1.025 04/25/2020 1511   PHURINE 6.0 04/25/2020 1511   GLUCOSEU NEGATIVE 04/25/2020 1511   HGBUR NEGATIVE 04/25/2020 1511   BILIRUBINUR NEGATIVE 04/25/2020 1511   BILIRUBINUR small 02/05/2012 1931   KETONESUR NEGATIVE 04/25/2020 1511   PROTEINUR NEGATIVE 11/16/2019 0240   UROBILINOGEN 1.0 04/25/2020 1511   NITRITE NEGATIVE 04/25/2020 1511   LEUKOCYTESUR NEGATIVE 04/25/2020 1511   Sepsis Labs Invalid input(s): PROCALCITONIN,  WBC,  LACTICIDVEN Microbiology Recent Results (from the past 240 hour(s))  SARS CORONAVIRUS 2 (TAT 6-24 HRS) Nasopharyngeal Nasopharyngeal Swab     Status: None   Collection Time: 05/20/20  1:08 AM   Specimen: Nasopharyngeal Swab  Result Value Ref Range Status   SARS Coronavirus 2 NEGATIVE NEGATIVE Final    Comment: (NOTE) SARS-CoV-2 target nucleic acids are NOT DETECTED.  The SARS-CoV-2 RNA is generally detectable in upper and lower respiratory specimens during the acute phase of infection. Negative results do not preclude SARS-CoV-2 infection, do not rule out co-infections with other pathogens, and should not be used as the sole basis for treatment or other patient management decisions. Negative results must be combined with clinical observations, patient history, and epidemiological information. The expected result is Negative.  Fact Sheet for  Patients: SugarRoll.be  Fact Sheet for Healthcare Providers: https://www.woods-mathews.com/  This test is not yet approved or cleared by the [place] FDA and  has been authorized for detection and/or diagnosis of SARS-CoV-2 by FDA under an Emergency Use Authorization (EUA). This EUA will remain  in effect (meaning this test can be used) for the duration of the COVID-19 declaration under Se ction 564(b)(1) of the Act, 21 U.S.C. section 360bbb-3(b)(1), unless the authorization is terminated or revoked sooner.  Performed at [facility], [place] [address] [place], [place] [address]      Patient was seen and examined on the day of discharge and was found to be in stable condition. Time coordinating discharge: 20 minutes including assessment and coordination of care, as well as examination of the patient.   SIGNED:  Dessa Phi, DO [facility] 05/21/2020, 9:29 AM

## 2020-05-21 NOTE — ED Notes (Signed)
Provider made aware of drastic lab changes on metabolic panel. This nurse will redraw and report any critical values.

## 2020-05-21 NOTE — Discharge Instructions (Signed)
[employer] medicine (25th ed., pp. [phone]). [place], [place]: Elsevier.">  Anemia  Anemia is a condition in which there is not enough red blood cells or hemoglobin in the blood. Hemoglobin is a substance in red blood cells that carries oxygen. When you do not have enough red blood cells or hemoglobin (are anemic), your body cannot get enough oxygen and your organs may not work properly. As a result, you may feel very tired or have other problems. What are the causes? Common causes of anemia include:  Excessive bleeding. Anemia can be caused by excessive bleeding inside or outside the body, including bleeding from the intestines or from heavy menstrual periods in females.  Poor nutrition.  Long-lasting (chronic) kidney, thyroid, and liver disease.  Bone marrow disorders, spleen problems, and blood disorders.  Cancer and treatments for cancer.  HIV (human immunodeficiency virus) and AIDS (acquired immunodeficiency syndrome).  Infections, medicines, and autoimmune disorders that destroy red blood cells. What are the signs or symptoms? Symptoms of this condition include:  Minor weakness.  Dizziness.  Headache, or difficulties concentrating and sleeping.  Heartbeats that feel irregular or faster than normal (palpitations).  Shortness of breath, especially with exercise.  Pale skin, lips, and nails, or cold hands and feet.  Indigestion and nausea. Symptoms may occur suddenly or develop slowly. If your anemia is mild, you may not have symptoms. How is this diagnosed? This condition is diagnosed based on blood tests, your medical history, and a physical exam. In some cases, a test may be needed in which cells are removed from the soft tissue inside of a bone and looked at under a microscope (bone marrow biopsy). Your health care provider may also check your stool (feces) for blood and may do additional testing to look for the cause of your bleeding. Other tests may  include:  Imaging tests, such as a CT scan or MRI.  A procedure to see inside your esophagus and stomach (endoscopy).  A procedure to see inside your colon and rectum (colonoscopy). How is this treated? Treatment for this condition depends on the cause. If you continue to lose a lot of blood, you may need to be treated at a hospital. Treatment may include:  Taking supplements of iron, vitamin R00, or folic acid.  Taking a hormone medicine (erythropoietin) that can help to stimulate red blood cell growth.  Having a blood transfusion. This may be needed if you lose a lot of blood.  Making changes to your diet.  Having surgery to remove your spleen. Follow these instructions at home:  Take over-the-counter and prescription medicines only as told by your health care provider.  Take supplements only as told by your health care provider.  Follow any diet instructions that you were given by your health care provider.  Keep all follow-up visits as told by your health care provider. This is important. Contact a health care provider if:  You develop new bleeding anywhere in the body. Get help right away if:  You are very weak.  You are short of breath.  You have pain in your abdomen or chest.  You are dizzy or feel faint.  You have trouble concentrating.  You have bloody stools, black stools, or tarry stools.  You vomit repeatedly or you vomit up blood. These symptoms may represent a serious problem that is an emergency. Do not wait to see if the symptoms will go away. Get medical help right away. Call your local emergency services (911 in the [place] Do not  drive yourself to the hospital. Summary  Anemia is a condition in which you do not have enough red blood cells or enough of a substance in your red blood cells that carries oxygen (hemoglobin).  Symptoms may occur suddenly or develop slowly.  If your anemia is mild, you may not have symptoms.  This condition is  diagnosed with blood tests, a medical history, and a physical exam. Other tests may be needed.  Treatment for this condition depends on the cause of the anemia. This information is not intended to replace advice given to you by your health care provider. Make sure you discuss any questions you have with your health care provider. Document Revised: 03/31/2019 Document Reviewed: 03/31/2019 Elsevier Patient Education  2021 [place].   Gastrointestinal Bleeding Gastrointestinal (GI) bleeding is bleeding somewhere along the path that food travels through the body (digestive tract). This path is anywhere between the mouth and the opening of the butt (anus). You may have blood in your poop (stool) or have black poop. If you throw up (vomit), there may be blood in it. This condition can be mild, serious, or even life-threatening. If you have a lot of bleeding, you may need to stay in the hospital. What are the causes? This condition may be caused by:  Irritation and swelling of the esophagus (esophagitis). The esophagus is part of the body that moves food from your mouth to your stomach.  Swollen veins in the butt (hemorrhoids).  Areas of painful tearing in the opening of the butt (anal fissures). These are often caused by passing hard poop.  Pouches that form on the colon over time (diverticulosis).  Irritation and swelling (diverticulitis) in areas where pouches have formed on the colon.  Growths (polyps) or cancer. Colon cancer often starts out as growths that are not cancer.  Irritation of the stomach lining (gastritis).  Sores (ulcers) in the stomach. What increases the risk? You are more likely to develop this condition if you:  Have a certain type of infection in your stomach (Helicobacter pylori infection).  Take certain medicines.  Smoke.  Drink alcohol. What are the signs or symptoms? Common symptoms of this condition include:  Throwing up (vomiting) material that has  bright red blood in it. It may look like coffee grounds.  Changes in your poop. The poop may: ? Have red blood in it. ? Be black, look like tar, and smell stronger than normal. ? Be red.  Pain or cramping in the belly (abdomen). How is this treated? Treatment for this condition depends on the cause of the bleeding. For example:  Sometimes, the bleeding can be stopped during a procedure that is done to find the problem (endoscopy or colonoscopy).  Medicines can be used to: ? Help control irritation, swelling, or infection. ? Reduce acid in your stomach.  Certain problems can be treated with: ? Creams. ? Medicines that are put in the butt (suppositories). ? Warm baths.  Surgery is sometimes needed.  If you lose a lot of blood, you may need a blood transfusion. If bleeding is mild, you may be allowed to go home. If there is a lot of bleeding, you will need to stay in the hospital. Follow these instructions at home:  Take over-the-counter and prescription medicines only as told by your doctor.  Eat foods that have a lot of fiber in them. These foods include beans, whole grains, and fresh fruits and vegetables. You can also try eating 1-3 prunes each day.  Drink enough fluid to keep your pee (urine) pale yellow.  Keep all follow-up visits as told by your doctor. This is important.   Contact a doctor if:  Your symptoms do not get better. Get help right away if:  Your bleeding does not stop.  You feel dizzy or you pass out (faint).  You feel weak.  You have very bad cramps in your back or belly.  You pass large clumps of blood (clots) in your poop.  Your symptoms are getting worse.  You have chest pain or fast heartbeats. Summary  GI bleeding is bleeding somewhere along the path that food travels through the body (digestive tract).  This bleeding can be caused by many things. Treatment depends on the cause of the bleeding.  Take medicines only as told by your  doctor.  Keep all follow-up visits as told by your doctor. This is important. This information is not intended to replace advice given to you by your health care provider. Make sure you discuss any questions you have with your health care provider. Document Revised: 12/04/2017 Document Reviewed: 12/04/2017 Elsevier Patient Education  [place].

## 2020-05-23 ENCOUNTER — Other Ambulatory Visit: Payer: Self-pay

## 2020-05-23 ENCOUNTER — Telehealth: Payer: Self-pay | Admitting: Internal Medicine

## 2020-05-23 NOTE — Telephone Encounter (Signed)
1.Medication Requested: oxyCODONE-acetaminophen (PERCOCET) 10-325 MG tablet  2. Pharmacy (Name, [place]):  [id number] - [place], [place] Phone:  [phone]  Fax:  [phone]       3. On Med List: y 3. Last Visit with PCP:  5. Next visit date with PCP:   Agent: Please be advised that RX refills may take up to 3 business days. We ask that you follow-up with your pharmacy.

## 2020-05-24 ENCOUNTER — Encounter (HOSPITAL_COMMUNITY): Payer: Self-pay | Admitting: Gastroenterology

## 2020-05-24 ENCOUNTER — Other Ambulatory Visit: Payer: Self-pay | Admitting: Gastroenterology

## 2020-05-24 DIAGNOSIS — K922 Gastrointestinal hemorrhage, unspecified: Secondary | ICD-10-CM

## 2020-05-24 LAB — SURGICAL PATHOLOGY

## 2020-05-25 ENCOUNTER — Other Ambulatory Visit: Payer: Self-pay | Admitting: Internal Medicine

## 2020-05-25 DIAGNOSIS — I10 Essential (primary) hypertension: Secondary | ICD-10-CM

## 2020-05-26 ENCOUNTER — Telehealth: Payer: Self-pay | Admitting: Internal Medicine

## 2020-05-26 ENCOUNTER — Other Ambulatory Visit: Payer: Self-pay | Admitting: Internal Medicine

## 2020-05-26 DIAGNOSIS — M159 Polyosteoarthritis, unspecified: Secondary | ICD-10-CM

## 2020-05-26 DIAGNOSIS — M5416 Radiculopathy, lumbar region: Secondary | ICD-10-CM

## 2020-05-26 MED ORDER — OXYCODONE-ACETAMINOPHEN 10-325 MG PO TABS
1.0000 | ORAL_TABLET | Freq: Four times a day (QID) | ORAL | 0 refills | Status: DC | PRN
Start: 2020-05-26 — End: 2020-06-27

## 2020-05-26 NOTE — Telephone Encounter (Signed)
° °  Patient requesting refill for oxyCODONE-acetaminophen (PERCOCET) 10-325 MG tablet Pharmacy [id number] - [place], [place] - [address] Last appt 05/19/20 

## 2020-05-30 ENCOUNTER — Other Ambulatory Visit (INDEPENDENT_AMBULATORY_CARE_PROVIDER_SITE_OTHER): Payer: Medicare Other

## 2020-05-30 DIAGNOSIS — K922 Gastrointestinal hemorrhage, unspecified: Secondary | ICD-10-CM | POA: Diagnosis not present

## 2020-05-30 LAB — CBC
HCT: 38.9 % — ABNORMAL LOW (ref 39.0–52.0)
Hemoglobin: 13.2 g/dL (ref 13.0–17.0)
MCHC: 33.9 g/dL (ref 30.0–36.0)
MCV: 91 fl (ref 78.0–100.0)
Platelets: 449 10*3/uL — ABNORMAL HIGH (ref 150.0–400.0)
RBC: 4.27 Mil/uL (ref 4.22–5.81)
RDW: 15.6 % — ABNORMAL HIGH (ref 11.5–15.5)
WBC: 8 10*3/uL (ref 4.0–10.5)

## 2020-05-30 LAB — HEPATIC FUNCTION PANEL
ALT: 9 U/L (ref 0–53)
AST: 13 U/L (ref 0–37)
Albumin: 4.3 g/dL (ref 3.5–5.2)
Alkaline Phosphatase: 94 U/L (ref 39–117)
Bilirubin, Direct: 0.1 mg/dL (ref 0.0–0.3)
Total Bilirubin: 0.4 mg/dL (ref 0.2–1.2)
Total Protein: 8.1 g/dL (ref 6.0–8.3)

## 2020-06-23 DIAGNOSIS — M47816 Spondylosis without myelopathy or radiculopathy, lumbar region: Secondary | ICD-10-CM | POA: Diagnosis not present

## 2020-06-24 ENCOUNTER — Telehealth: Payer: Self-pay | Admitting: Internal Medicine

## 2020-06-24 NOTE — Telephone Encounter (Signed)
1.Medication Requested: oxyCODONE-acetaminophen (PERCOCET) 10-325 MG tablet  2. Pharmacy (Name, [place]):  [id number] - [place], [place] Phone:  [phone]  Fax:  [phone]      3. On Med List: Yes   4. Last Visit with PCP:  1.13.2022  5. Next visit date with PCP: N/A   Agent: Please be advised that RX refills may take up to 3 business days. We ask that you follow-up with your pharmacy.

## 2020-06-27 ENCOUNTER — Other Ambulatory Visit: Payer: Self-pay | Admitting: Internal Medicine

## 2020-06-27 DIAGNOSIS — I10 Essential (primary) hypertension: Secondary | ICD-10-CM

## 2020-06-27 DIAGNOSIS — M5416 Radiculopathy, lumbar region: Secondary | ICD-10-CM

## 2020-06-27 DIAGNOSIS — M159 Polyosteoarthritis, unspecified: Secondary | ICD-10-CM

## 2020-06-27 MED ORDER — OXYCODONE-ACETAMINOPHEN 10-325 MG PO TABS: 1.0000 | ORAL_TABLET | Freq: Four times a day (QID) | ORAL | 0 refills | Status: DC | PRN

## 2020-06-28 ENCOUNTER — Ambulatory Visit (INDEPENDENT_AMBULATORY_CARE_PROVIDER_SITE_OTHER): Payer: Medicare Other | Admitting: Gastroenterology

## 2020-06-28 ENCOUNTER — Encounter: Payer: Self-pay | Admitting: Gastroenterology

## 2020-06-28 VITALS — BP 114/62 | HR 68 | Ht 69.0 in | Wt 183.0 lb

## 2020-06-28 DIAGNOSIS — D62 Acute posthemorrhagic anemia: Secondary | ICD-10-CM | POA: Diagnosis not present

## 2020-06-28 DIAGNOSIS — K2100 Gastro-esophageal reflux disease with esophagitis, without bleeding: Secondary | ICD-10-CM

## 2020-06-28 DIAGNOSIS — K259 Gastric ulcer, unspecified as acute or chronic, without hemorrhage or perforation: Secondary | ICD-10-CM

## 2020-06-28 DIAGNOSIS — K269 Duodenal ulcer, unspecified as acute or chronic, without hemorrhage or perforation: Secondary | ICD-10-CM

## 2020-06-28 NOTE — Patient Instructions (Signed)
Your provider has requested that you go to the basement level for lab work before leaving today. Press "B" on the elevator. The lab is located at the first door on the left as you exit the elevator.  You have been scheduled for a colonoscopy. Please follow written instructions given to you at your visit today.  Please pick up your prep supplies at the pharmacy within the next 1-3 days. If you use inhalers (even only as needed), please bring them with you on the day of your procedure.   Due to recent changes in healthcare laws, you may see the results of your imaging and laboratory studies on MyChart before your provider has had a chance to review them.  We understand that in some cases there may be results that are confusing or concerning to you. Not all laboratory results come back in the same time frame and the provider may be waiting for multiple results in order to interpret others.  Please give [place] 48 hours in order for your provider to thoroughly review all the results before contacting the office for clarification of your results.   Thank you for choosing me and [facility].  Pricilla Riffle. Dagoberto Ligas., MD., Marval Regal

## 2020-06-28 NOTE — Progress Notes (Addendum)
    History of Present Illness: This is a 80 year old male hospitalized with bleeding GUs and DUs. He presented with melena, hematemesis, near syncope.  EGD as below.  Hemoglobin at discharge was 13.2.  He relates no bleeding, heartburn, nausea, vomiting, abdominal pain.  States he feels very well.  EGD Jan 14. 2022 - LA Grade B reflux esophagitis with no bleeding. - Benign-appearing esophageal stenosis. - 2 cm hiatal hernia. - Non-bleeding gastric ulcers with no stigmata of bleeding. Biopsied. - Normal gastric fundus, gastric body and incisura. - Non-bleeding duodenal ulcers with no stigmata of bleeding. - Normal second portion of the duodenum. - Based on the endoscopic appearance of no high grade stigmata of bleeding and no active bleeding, no further endoscopic intervention was indicated in favor of continued medical management.   Current Medications, Allergies, Past Medical History, Past Surgical History, Family History and Social History were reviewed in [employer] record.   Physical Exam: General: Well developed, well nourished, no acute distress Head: Normocephalic and atraumatic Eyes: Sclerae anicteric, EOMI Ears: Normal auditory acuity Mouth: Not examined, mask on during Covid-19 pandemic Lungs: Clear throughout to auscultation Heart: Regular rate and rhythm; no murmurs, rubs or bruits Abdomen: Soft, non tender and non distended. No masses, hepatosplenomegaly or hernias noted. Normal Bowel sounds Rectal: Not done Musculoskeletal: Symmetrical with no gross deformities  Pulses:  Normal pulses noted Extremities: No clubbing, cyanosis, edema or deformities noted Neurological: Alert oriented x 4, grossly nonfocal Psychological:  Alert and cooperative. Normal mood and affect   Assessment and Recommendations:  1.  Recent upper GI bleed from gastric ulcers and duodenal ulcers requiring hospitalization.  Gastric biopsy was negative for H. pylori.  GERD  with LA class B esophagitis and an esophageal stricture.  He is advised to strictly avoid all aspirin and NSAID products long-term.  He is advised to remain on a PPI long-term.  Follow antireflux measures long-term.  Continue pantoprazole 40 mg twice daily until EGD then consider reducing to once daily.  Schedule EGD about 2 to 3 months from January 14 EGD. The risks (including bleeding, perforation, infection, missed lesions, medication reactions and possible hospitalization or surgery if complications occur), benefits, and alternatives to endoscopy with possible biopsy and possible dilation were discussed with the patient and they consent to proceed.   2. ABL anemia.  Repeat CBC today.

## 2020-07-11 ENCOUNTER — Emergency Department (HOSPITAL_COMMUNITY)
Admission: EM | Admit: 2020-07-11 | Discharge: 2020-07-11 | Disposition: A | Discharge status: Home / Self Care | Payer: Medicare Other | Attending: Emergency Medicine | Admitting: Emergency Medicine

## 2020-07-11 ENCOUNTER — Other Ambulatory Visit: Payer: Self-pay

## 2020-07-11 ENCOUNTER — Encounter (HOSPITAL_COMMUNITY): Payer: Self-pay

## 2020-07-11 ENCOUNTER — Emergency Department (HOSPITAL_COMMUNITY): Payer: Medicare Other

## 2020-07-11 DIAGNOSIS — Z96611 Presence of right artificial shoulder joint: Secondary | ICD-10-CM | POA: Diagnosis not present

## 2020-07-11 DIAGNOSIS — K219 Gastro-esophageal reflux disease without esophagitis: Secondary | ICD-10-CM | POA: Insufficient documentation

## 2020-07-11 DIAGNOSIS — I129 Hypertensive chronic kidney disease with stage 1 through stage 4 chronic kidney disease, or unspecified chronic kidney disease: Secondary | ICD-10-CM | POA: Insufficient documentation

## 2020-07-11 DIAGNOSIS — R1031 Right lower quadrant pain: Secondary | ICD-10-CM | POA: Diagnosis not present

## 2020-07-11 DIAGNOSIS — F17210 Nicotine dependence, cigarettes, uncomplicated: Secondary | ICD-10-CM | POA: Insufficient documentation

## 2020-07-11 DIAGNOSIS — K760 Fatty (change of) liver, not elsewhere classified: Secondary | ICD-10-CM | POA: Diagnosis not present

## 2020-07-11 DIAGNOSIS — Z96651 Presence of right artificial knee joint: Secondary | ICD-10-CM | POA: Insufficient documentation

## 2020-07-11 DIAGNOSIS — N182 Chronic kidney disease, stage 2 (mild): Secondary | ICD-10-CM | POA: Diagnosis not present

## 2020-07-11 LAB — COMPREHENSIVE METABOLIC PANEL
ALT: 11 U/L (ref 0–44)
AST: 21 U/L (ref 15–41)
Albumin: 4.3 g/dL (ref 3.5–5.0)
Alkaline Phosphatase: 86 U/L (ref 38–126)
Anion gap: 15 (ref 5–15)
BUN: 23 mg/dL (ref 8–23)
CO2: 21 mmol/L — ABNORMAL LOW (ref 22–32)
Calcium: 9.5 mg/dL (ref 8.9–10.3)
Chloride: 105 mmol/L (ref 98–111)
Creatinine, Ser: 1.28 mg/dL — ABNORMAL HIGH (ref 0.61–1.24)
GFR, Estimated: 57 mL/min — ABNORMAL LOW (ref >60)
Glucose, Bld: 108 mg/dL — ABNORMAL HIGH (ref 70–99)
Potassium: 4 mmol/L (ref 3.5–5.1)
Sodium: 141 mmol/L (ref 135–145)
Total Bilirubin: 1.1 mg/dL (ref 0.3–1.2)
Total Protein: 8.6 g/dL — ABNORMAL HIGH (ref 6.5–8.1)

## 2020-07-11 LAB — URINALYSIS, ROUTINE W REFLEX MICROSCOPIC
Bilirubin Urine: NEGATIVE
Glucose, UA: NEGATIVE mg/dL
Hgb urine dipstick: NEGATIVE
Ketones, ur: NEGATIVE mg/dL
Leukocytes,Ua: NEGATIVE
Nitrite: NEGATIVE
Protein, ur: NEGATIVE mg/dL
Specific Gravity, Urine: 1.044 — ABNORMAL HIGH (ref 1.005–1.030)
pH: 5 (ref 5.0–8.0)

## 2020-07-11 LAB — CBC
HCT: 41.8 % (ref 39.0–52.0)
Hemoglobin: 13.7 g/dL (ref 13.0–17.0)
MCH: 30.4 pg (ref 26.0–34.0)
MCHC: 32.8 g/dL (ref 30.0–36.0)
MCV: 92.9 fL (ref 80.0–100.0)
Platelets: 404 10*3/uL — ABNORMAL HIGH (ref 150–400)
RBC: 4.5 MIL/uL (ref 4.22–5.81)
RDW: 16.4 % — ABNORMAL HIGH (ref 11.5–15.5)
WBC: 7.7 10*3/uL (ref 4.0–10.5)
nRBC: 0 % (ref 0.0–0.2)

## 2020-07-11 LAB — LIPASE, BLOOD: Lipase: 46 U/L (ref 11–51)

## 2020-07-11 MED ORDER — IOHEXOL 300 MG/ML  SOLN
100.0000 mL | Freq: Once | INTRAMUSCULAR | Status: AC | PRN
  Administered 2020-07-11: 100 mL via INTRAVENOUS

## 2020-07-11 MED ORDER — OXYCODONE HCL 5 MG PO TABS
5.0000 mg | ORAL_TABLET | Freq: Once | ORAL | Status: AC
  Administered 2020-07-11: 5 mg via ORAL
  Filled 2020-07-11: qty 1

## 2020-07-11 MED ORDER — SODIUM CHLORIDE 0.9 % IV BOLUS
500.0000 mL | Freq: Once | INTRAVENOUS | Status: AC
  Administered 2020-07-11: 500 mL via INTRAVENOUS

## 2020-07-11 MED ORDER — OXYCODONE-ACETAMINOPHEN 5-325 MG PO TABS
1.0000 | ORAL_TABLET | Freq: Once | ORAL | Status: AC
  Administered 2020-07-11: 1 via ORAL
  Filled 2020-07-11: qty 1

## 2020-07-11 NOTE — ED Notes (Signed)
Pt aware urine sample is needed; urinal at bedside.

## 2020-07-11 NOTE — ED Triage Notes (Signed)
Pt presents with c/o RLQ for approx one week that has been progressively getting worse. Pt reports he is scared that his appendix may have ruptured because the pain is becoming more severe.

## 2020-07-11 NOTE — ED Provider Notes (Signed)
[facility] DEPT Provider Note   CSN: [id number] Arrival date & time: 07/11/20  1941     History Chief Complaint  Patient presents with  . Abdominal Pain    Adrian Miller is a 80 y.o. male.  Pt presents to the ED today with RLQ abd pain.  He said he has had it for about 1 week.  He is worried that his appendix has ruptured.  Pt denies any n/v.  No f/c.  Pt denies any urinary sx.  He does have a hx of multiple gastric ulcers, but denies any bleeding.  Pain feels different than that.        Past Medical History:  Diagnosis Date  . Acute esophagitis 06/01/2015  . Benign essential HTN 05/31/2015  . Duodenal ulcer hemorrhage   . Esophageal stricture 06/01/2015  . Gastric ulcer   . Hiatal hernia 06/01/2015    Patient Active Problem List   Diagnosis Date Noted  . GI bleeding 05/20/2020  . Hypokalemia 05/20/2020  . Chronic pain 05/20/2020  . Near syncope 05/20/2020  . Orthostatic hypotension   . Symptomatic anemia   . Multiple gastric ulcers   . Multiple duodenal ulcers   . Gastrointestinal hemorrhage 05/19/2020  . Routine general medical examination at a health care facility 10/25/2019  . Bilateral recurrent inguinal hernia without obstruction or gangrene 10/22/2019  . PAD (peripheral artery disease) ([place]) 01/22/2018  . Long-term current use of opiate analgesic 10/17/2017  . CKD (chronic kidney disease), stage II 09/29/2017  . Elevated liver enzymes 09/28/2017  . Encounter for long-term use of opiate analgesic 08/09/2017  . Bradycardia 11/26/2016  . Left lumbar radiculopathy 01/18/2016  . Left carpal tunnel syndrome 12/28/2015  . Hyperlipidemia with target LDL less than 130 12/26/2015  . GERD (gastroesophageal reflux disease) 12/19/2015  . Tobacco abuse counseling 12/19/2015  . Prediabetes 06/14/2015  . Generalized OA 06/14/2015  . B12 deficiency anemia 06/14/2015  . Other iron deficiency anemias 06/14/2015  . Peptic stricture of  esophagus 06/01/2015  . Acute blood loss anemia 05/31/2015  . Benign essential HTN 05/31/2015  . Melena   . Duodenal ulcer hemorrhage     Past Surgical History:  Procedure Laterality Date  . BIOPSY  05/20/2020   Procedure: BIOPSY;  Surgeon: Lavena Bullion, DO;  Location: [facility];  Service: Gastroenterology;;  . CHOLECYSTECTOMY N/A 09/30/2017   Procedure: LAPAROSCOPIC CHOLECYSTECTOMY WITH INTRAOPERATIVE CHOLANGIOGRAM;  Surgeon: Excell Seltzer, MD;  Location: [facility];  Service: General;  Laterality: N/A;  . ESOPHAGOGASTRODUODENOSCOPY N/A 05/31/2015   Procedure: ESOPHAGOGASTRODUODENOSCOPY (EGD);  Surgeon: Ladene Artist, MD;  Location: [facility];  Service: Endoscopy;  Laterality: N/A;  . ESOPHAGOGASTRODUODENOSCOPY (EGD) WITH PROPOFOL N/A 05/20/2020   Procedure: ESOPHAGOGASTRODUODENOSCOPY (EGD) WITH PROPOFOL;  Surgeon: Lavena Bullion, DO;  Location: [facility];  Service: Gastroenterology;  Laterality: N/A;  . HERNIA REPAIR  1975  . LEFT ROTATOR CUFF REPAIR  2002  . REPLACEMENT TOTAL KNEE Left   . REVERSE SHOULDER ARTHROPLASTY Right 03/20/2018   Procedure: RIGHT REVERSE SHOULDER ARTHROPLASTY;  Surgeon: Tania Ade, MD;  Location: [facility];  Service: Orthopedics;  Laterality: Right;  . TOTAL KNEE ARTHROPLASTY Right 02/22/2015   Procedure: TOTAL KNEE ARTHROPLASTY;  Surgeon: Melrose Nakayama, MD;  Location: [facility];  Service: Orthopedics;  Laterality: Right;       Family History  Problem Relation Age of Onset  . Hypertension Mother   . Heart attack Sister     Social History   Tobacco Use  .  Smoking status: Current Every Day Smoker    Packs/day: 0.50    Years: 51.00    Pack years: 25.50    Types: Cigarettes  . Smokeless tobacco: Former Systems developer    Quit date: 11/03/2014  Vaping Use  . Vaping Use: Never used  Substance Use Topics  . Alcohol use: No    Alcohol/week: 0.0 standard drinks  . Drug use: No    Home Medications Prior to Admission medications   Medication  Sig Start Date End Date Taking? Authorizing Provider  acetaminophen (TYLENOL) 325 MG tablet Take 650 mg by mouth every 6 (six) hours as needed for mild pain.    [provider]  chlorthalidone (HYGROTON) 25 MG tablet Take 1 tablet (25 mg total) by mouth daily. 01/22/20   Janith Lima, MD  oxyCODONE-acetaminophen (PERCOCET) 10-325 MG tablet Take 1 tablet by mouth every 6 (six) hours as needed for pain. 06/27/20   Janith Lima, MD  pantoprazole (PROTONIX) 40 MG tablet Take 1 tablet (40 mg total) by mouth 2 (two) times daily. 05/21/20 07/16/20  Dessa Phi, DO    Allergies    Asa [aspirin]  Review of Systems   Review of Systems  Gastrointestinal: Positive for abdominal pain.  All other systems reviewed and are negative.   Physical Exam Updated Vital Signs BP 122/77 (BP Location: Right Arm)   Pulse (!) 52   Temp 98.8 F (37.1 C) (Oral)   Resp 16   SpO2 100%   Physical Exam Vitals and nursing note reviewed.  Constitutional:      Appearance: He is well-developed.  HENT:     Head: Normocephalic and atraumatic.     Mouth/Throat:     Mouth: Mucous membranes are moist.     Pharynx: Oropharynx is clear.  Eyes:     Extraocular Movements: Extraocular movements intact.     Pupils: Pupils are equal, round, and reactive to light.  Cardiovascular:     Rate and Rhythm: Normal rate and regular rhythm.     Heart sounds: Normal heart sounds.  Pulmonary:     Effort: Pulmonary effort is normal.     Breath sounds: Normal breath sounds.  Abdominal:     General: Abdomen is flat. Bowel sounds are normal.     Palpations: Abdomen is soft.     Tenderness: There is abdominal tenderness in the right lower quadrant.  Skin:    General: Skin is warm.     Capillary Refill: Capillary refill takes less than 2 seconds.  Neurological:     General: No focal deficit present.     Mental Status: He is alert and oriented to person, place, and time.  Psychiatric:        Mood and Affect: Mood  normal.        Behavior: Behavior normal.     ED Results / Procedures / Treatments   Labs (all labs ordered are listed, but only abnormal results are displayed) Labs Reviewed  COMPREHENSIVE METABOLIC PANEL - Abnormal; Notable for the following components:      Result Value   CO2 21 (*)    Glucose, Bld 108 (*)    Creatinine, Ser 1.28 (*)    Total Protein 8.6 (*)    GFR, Estimated 57 (*)    All other components within normal limits  CBC - Abnormal; Notable for the following components:   RDW 16.4 (*)    Platelets 404 (*)    All other components within normal limits  URINALYSIS,  ROUTINE W REFLEX MICROSCOPIC - Abnormal; Notable for the following components:   Specific Gravity, Urine 1.044 (*)    All other components within normal limits  LIPASE, BLOOD    EKG None  Radiology CT ABDOMEN PELVIS W CONTRAST  Result Date: 07/11/2020 CLINICAL DATA:  Right lower quadrant pain. EXAM: CT ABDOMEN AND PELVIS WITH CONTRAST TECHNIQUE: Multidetector CT imaging of the abdomen and pelvis was performed using the standard protocol following bolus administration of intravenous contrast. CONTRAST:  145mL OMNIPAQUE IOHEXOL 300 MG/ML  SOLN COMPARISON:  November 16, 2019 FINDINGS: Lower chest: Linear scarring and/or atelectasis is seen within the right middle lobe. Hepatobiliary: There is diffuse fatty infiltration of the liver parenchyma. No focal liver abnormality is seen. Status post cholecystectomy. Mild central intrahepatic biliary dilatation is seen. The common bile duct measures 1.3 cm. Pancreas: Unremarkable. No pancreatic ductal dilatation or surrounding inflammatory changes. Spleen: Normal in size without focal abnormality. Adrenals/Urinary Tract: Adrenal glands are unremarkable. Kidneys are normal, without renal calculi or hydronephrosis. Numerous bilateral simple renal cysts of various sizes are present. Bladder is unremarkable. Stomach/Bowel: Stomach is within normal limits. Appendix appears normal. No  evidence of bowel dilatation. Noninflamed diverticula are seen throughout the large bowel. Vascular/Lymphatic: Aortic atherosclerosis. No enlarged abdominal or pelvic lymph nodes. Reproductive: Moderate to marked severity prostate gland enlargement is seen. Other: No abdominal wall hernia or abnormality. No abdominopelvic ascites. Musculoskeletal: Marked severity multilevel degenerative changes seen throughout the lumbar spine. IMPRESSION: 1. Hepatic steatosis. 2. Status post cholecystectomy. 3. Numerous bilateral simple renal cysts. 4. Colonic diverticulosis. 5. Enlarged prostate gland. 6. Marked severity multilevel degenerative changes throughout the lumbar spine. 7. Aortic atherosclerosis. Aortic Atherosclerosis (ICD10-I70.0). Electronically Signed   By: Virgina Norfolk M.D.   On: 07/11/2020 12:31    Procedures Procedures   Medications Ordered in ED Medications  iohexol (OMNIPAQUE) 300 MG/ML solution 100 mL (100 mLs Intravenous Contrast Given 07/11/20 1207)  sodium chloride 0.9 % bolus 500 mL (500 mLs Intravenous New Bag/Given 07/11/20 1305)  oxyCODONE-acetaminophen (PERCOCET/ROXICET) 5-325 MG per tablet 1 tablet (1 tablet Oral Given 07/11/20 1459)  oxyCODONE (Oxy IR/ROXICODONE) immediate release tablet 5 mg (5 mg Oral Given 07/11/20 1459)    ED Course  I have reviewed the triage vital signs and the nursing notes.  Pertinent labs & imaging results that were available during my care of the patient were reviewed by me and considered in my medical decision making (see chart for details).    MDM Rules/Calculators/A&P                          CT scan negative for anything acute.  Labs without anything acute.    Pain possibly radiating from his back as he has severe degenerative changes in his lumbar spine.  Pt is already on percocet 10s.  He is feeling better after his normal home dose.   Pt is stable for d/c.  He is to return if worse. F/u with pcp. Final Clinical Impression(s) / ED  Diagnoses Final diagnoses:  Right lower quadrant abdominal pain    Rx / DC Orders ED Discharge Orders    None       Isla Pence, MD 07/11/20 1521

## 2020-07-16 ENCOUNTER — Other Ambulatory Visit: Payer: Self-pay | Admitting: Internal Medicine

## 2020-07-16 DIAGNOSIS — I10 Essential (primary) hypertension: Secondary | ICD-10-CM

## 2020-07-22 ENCOUNTER — Other Ambulatory Visit: Payer: Self-pay | Admitting: Internal Medicine

## 2020-07-22 ENCOUNTER — Telehealth: Payer: Self-pay | Admitting: Internal Medicine

## 2020-07-22 DIAGNOSIS — I10 Essential (primary) hypertension: Secondary | ICD-10-CM

## 2020-07-27 ENCOUNTER — Other Ambulatory Visit: Payer: Self-pay | Admitting: Internal Medicine

## 2020-07-27 DIAGNOSIS — M159 Polyosteoarthritis, unspecified: Secondary | ICD-10-CM

## 2020-07-27 DIAGNOSIS — M5416 Radiculopathy, lumbar region: Secondary | ICD-10-CM

## 2020-07-27 MED ORDER — OXYCODONE-ACETAMINOPHEN 10-325 MG PO TABS: 1.0000 | ORAL_TABLET | Freq: Four times a day (QID) | ORAL | 0 refills | Status: DC | PRN

## 2020-07-27 NOTE — Telephone Encounter (Signed)
Patient requesting refill for oxyCODONE-acetaminophen (PERCOCET) 10-325 MG tablet  Pharmacy [id number] [place]

## 2020-08-09 ENCOUNTER — Other Ambulatory Visit: Payer: Self-pay

## 2020-08-09 ENCOUNTER — Encounter: Payer: Self-pay | Admitting: Gastroenterology

## 2020-08-09 ENCOUNTER — Ambulatory Visit (AMBULATORY_SURGERY_CENTER): Payer: Medicare Other | Admitting: Gastroenterology

## 2020-08-09 VITALS — BP 116/73 | HR 58 | Temp 97.3°F | Resp 17 | Ht 69.0 in | Wt 183.0 lb

## 2020-08-09 DIAGNOSIS — K250 Acute gastric ulcer with hemorrhage: Secondary | ICD-10-CM

## 2020-08-09 DIAGNOSIS — K259 Gastric ulcer, unspecified as acute or chronic, without hemorrhage or perforation: Secondary | ICD-10-CM | POA: Diagnosis not present

## 2020-08-09 DIAGNOSIS — K222 Esophageal obstruction: Secondary | ICD-10-CM

## 2020-08-09 DIAGNOSIS — Z1211 Encounter for screening for malignant neoplasm of colon: Secondary | ICD-10-CM | POA: Diagnosis not present

## 2020-08-09 DIAGNOSIS — K921 Melena: Secondary | ICD-10-CM

## 2020-08-09 MED ORDER — SODIUM CHLORIDE 0.9 % IV SOLN: 500.0000 mL | Freq: Once | INTRAVENOUS | Status: DC

## 2020-08-09 MED ORDER — PANTOPRAZOLE SODIUM 40 MG PO TBEC
40.0000 mg | DELAYED_RELEASE_TABLET | Freq: Every day | ORAL | 3 refills | Status: DC
Start: 2020-08-09 — End: 2021-05-22

## 2020-08-09 NOTE — Progress Notes (Signed)
Colonoscopy appt made for 08-17-20 at 11:00.  Instructions reviewed with pt and daughter.  Plenvu given.  Amb gi referral put in

## 2020-08-09 NOTE — Progress Notes (Signed)
JK vitals and bc IV.  Pt has 3 teeth that he can remove on the right side of his mouth. Will make the CRNA aware.

## 2020-08-09 NOTE — Progress Notes (Signed)
To PACU, VSS. Report to [email or web address] 

## 2020-08-09 NOTE — Op Note (Signed)
[facility] Patient Name: Adrian Miller Procedure Date: 08/09/2020 2:13 PM MRN: [id number] Endoscopist: Ladene Artist , MD Age: 80 Referring MD:  Date of Birth: 19-Sep-1940 Gender: Male Account #: [id number] Procedure:                Upper GI endoscopy Indications:              Follow-up of acute gastric ulcer with hemorrhage,                            Persistent melena Medicines:                Monitored Anesthesia Care Procedure:                Pre-Anesthesia Assessment:                           - Prior to the procedure, a History and Physical                            was performed, and patient medications and                            allergies were reviewed. The patient's tolerance of                            previous anesthesia was also reviewed. The risks                            and benefits of the procedure and the sedation                            options and risks were discussed with the patient.                            All questions were answered, and informed consent                            was obtained. Prior Anticoagulants: The patient has                            taken no previous anticoagulant or antiplatelet                            agents. ASA Grade Assessment: II - A patient with                            mild systemic disease. After reviewing the risks                            and benefits, the patient was deemed in                            satisfactory condition to undergo the procedure.  After obtaining informed consent, the endoscope was                            passed under direct vision. Throughout the                            procedure, the patient's blood pressure, pulse, and                            oxygen saturations were monitored continuously. The                            Endoscope was introduced through the mouth, and                            advanced to the second part of duodenum. The  upper                            GI endoscopy was accomplished without difficulty.                            The patient tolerated the procedure well. Scope In: Scope Out: Findings:                 One benign-appearing, intrinsic mild stenosis was                            found at the gastroesophageal junction. This                            stenosis measured 1.6 cm (inner diameter) x less                            than one cm (in length). The stenosis was traversed.                           The exam of the esophagus was otherwise normal.                           The entire examined stomach was normal. Prepyloric                            ulcers have completely healed.                           An acquired benign-appearing, intrinsic mild                            stenosis was found in the duodenal bulb and was                            traversed. Duodenal bulb ulcers have completely  healed.                           The exam of the duodenum was otherwise normal. Complications:            No immediate complications. Estimated Blood Loss:     Estimated blood loss: none. Impression:               - Benign-appearing esophageal stenosis.                           - Normal stomach.                           - Acquired duodenal stenosis.                           - No specimens collected. Recommendation:           - Patient has a contact number available for                            emergencies. The signs and symptoms of potential                            delayed complications were discussed with the                            patient. Return to normal activities tomorrow.                            Written discharge instructions were provided to the                            patient.                           - Resume previous diet.                           - Continue present medications.                           - Change pantoprazole to 40 mg po  qd.                           - Perform a colonoscopy at the next available                            appointment. Ladene Artist, MD 08/09/2020 2:40:18 PM This report has been signed electronically.

## 2020-08-09 NOTE — Patient Instructions (Signed)
Change Pantoprazole 40 mg to once daily.  Colonoscopy scheduled for April 13.     YOU HAD AN ENDOSCOPIC PROCEDURE TODAY AT [facility]:   Refer to the procedure report that was given to you for any specific questions about what was found during the examination.  If the procedure report does not answer your questions, please call your gastroenterologist to clarify.  If you requested that your care partner not be given the details of your procedure findings, then the procedure report has been included in a sealed envelope for you to review at your convenience later.  YOU SHOULD EXPECT: Some feelings of bloating in the abdomen. Passage of more gas than usual.  Walking can help get rid of the air that was put into your GI tract during the procedure and reduce the bloating. If you had a lower endoscopy (such as a colonoscopy or flexible sigmoidoscopy) you may notice spotting of blood in your stool or on the toilet paper. If you underwent a bowel prep for your procedure, you may not have a normal bowel movement for a few days.  Please Note:  You might notice some irritation and congestion in your nose or some drainage.  This is from the oxygen used during your procedure.  There is no need for concern and it should clear up in a day or so.  SYMPTOMS TO REPORT IMMEDIATELY:    Following upper endoscopy (EGD)  Vomiting of blood or coffee ground material  New chest pain or pain under the shoulder blades  Painful or persistently difficult swallowing  New shortness of breath  Fever of 100F or higher  Black, tarry-looking stools  For urgent or emergent issues, a gastroenterologist can be reached at any hour by calling [phone]. Do not use MyChart messaging for urgent concerns.    DIET:  We do recommend a small meal at first, but then you may proceed to your regular diet.  Drink plenty of fluids but you should avoid alcoholic beverages for 24 hours.  ACTIVITY:  You should plan to  take it easy for the rest of today and you should NOT DRIVE or use heavy machinery until tomorrow (because of the sedation medicines used during the test).    FOLLOW UP: Our staff will call the number listed on your records 48-72 hours following your procedure to check on you and address any questions or concerns that you may have regarding the information given to you following your procedure. If we do not reach you, we will leave a message.  We will attempt to reach you two times.  During this call, we will ask if you have developed any symptoms of COVID 19. If you develop any symptoms (ie: fever, flu-like symptoms, shortness of breath, cough etc.) before then, please call [phone].  If you test positive for Covid 19 in the 2 weeks post procedure, please call and report this information to [place].    If any biopsies were taken you will be contacted by phone or by letter within the next 1-3 weeks.  Please call us at [phone] if you have not heard about the biopsies in 3 weeks.    SIGNATURES/CONFIDENTIALITY: You and/or your care partner have signed paperwork which will be entered into your electronic medical record.  These signatures attest to the fact that that the information above on your After Visit Summary has been reviewed and is understood.  Full responsibility of the confidentiality of this discharge information lies with  you and/or your care-partner.

## 2020-08-11 ENCOUNTER — Telehealth: Payer: Self-pay | Admitting: *Deleted

## 2020-08-11 NOTE — Telephone Encounter (Signed)
  Follow up Call-  Call back number 08/09/2020  Post procedure Call Back phone  # [phone]  Permission to leave phone message Yes  Some recent data might be hidden     First attempt for follow up phone call. No answer at number given.  VM box full.  Unable to leave a message.   Only one attempt to call d/t phone outage

## 2020-08-16 ENCOUNTER — Encounter: Payer: Self-pay | Admitting: Gastroenterology

## 2020-08-17 ENCOUNTER — Other Ambulatory Visit: Payer: Self-pay

## 2020-08-17 ENCOUNTER — Ambulatory Visit (AMBULATORY_SURGERY_CENTER): Payer: Medicare Other | Admitting: Gastroenterology

## 2020-08-17 ENCOUNTER — Encounter: Payer: Self-pay | Admitting: Gastroenterology

## 2020-08-17 VITALS — BP 117/72 | HR 62 | Temp 98.0°F | Resp 10 | Ht 69.0 in | Wt 183.0 lb

## 2020-08-17 DIAGNOSIS — D123 Benign neoplasm of transverse colon: Secondary | ICD-10-CM | POA: Diagnosis not present

## 2020-08-17 DIAGNOSIS — K5730 Diverticulosis of large intestine without perforation or abscess without bleeding: Secondary | ICD-10-CM | POA: Diagnosis not present

## 2020-08-17 DIAGNOSIS — K640 First degree hemorrhoids: Secondary | ICD-10-CM | POA: Diagnosis not present

## 2020-08-17 DIAGNOSIS — D122 Benign neoplasm of ascending colon: Secondary | ICD-10-CM | POA: Diagnosis not present

## 2020-08-17 DIAGNOSIS — D124 Benign neoplasm of descending colon: Secondary | ICD-10-CM

## 2020-08-17 DIAGNOSIS — D62 Acute posthemorrhagic anemia: Secondary | ICD-10-CM

## 2020-08-17 DIAGNOSIS — R195 Other fecal abnormalities: Secondary | ICD-10-CM | POA: Diagnosis not present

## 2020-08-17 DIAGNOSIS — K921 Melena: Secondary | ICD-10-CM | POA: Diagnosis not present

## 2020-08-17 MED ORDER — SODIUM CHLORIDE 0.9 % IV SOLN: 500.0000 mL | Freq: Once | INTRAVENOUS | Status: DC

## 2020-08-17 NOTE — Progress Notes (Signed)
VS-CW 

## 2020-08-17 NOTE — Op Note (Signed)
[place] Patient Name: Adrian Miller Procedure Date: 08/17/2020 10:49 AM MRN: [id number] Endoscopist: Ladene Artist , MD Age: 80 Referring MD:  Date of Birth: 15-Sep-1940 Gender: Male Account #: [id number] Procedure:                Colonoscopy Indications:              [facility] Medicines:                Monitored Anesthesia Care Procedure:                Pre-Anesthesia Assessment:                           - Prior to the procedure, a History and Physical                            was performed, and patient medications and                            allergies were reviewed. The patient's tolerance of                            previous anesthesia was also reviewed. The risks                            and benefits of the procedure and the sedation                            options and risks were discussed with the patient.                            All questions were answered, and informed consent                            was obtained. Prior Anticoagulants: The patient has                            taken no previous anticoagulant or antiplatelet                            agents. ASA Grade Assessment: II - A patient with                            mild systemic disease. After reviewing the risks                            and benefits, the patient was deemed in                            satisfactory condition to undergo the procedure.                           After obtaining informed consent, the colonoscope  was passed under direct vision. Throughout the                            procedure, the patient's blood pressure, pulse, and                            oxygen saturations were monitored continuously. The                            Colonoscope was introduced through the anus and                            advanced to the the cecum, identified by                            appendiceal orifice and ileocecal valve. The                             ileocecal valve, appendiceal orifice, and rectum                            were photographed. The quality of the bowel                            preparation was adequate after extensive lavage,                            suction. The colonoscopy was performed without                            difficulty. The patient tolerated the procedure                            well. Scope In: 10:59:51 AM Scope Out: 11:25:26 AM Scope Withdrawal Time: 0 hours 20 minutes 27 seconds  Total Procedure Duration: 0 hours 25 minutes 35 seconds  Findings:                 The perianal and digital rectal examinations were                            normal.                           Seven sessile polyps were found in the descending                            colon (1), transverse colon (4) and ascending colon                            (2). The polyps were 6 to 10 mm in size. These                            polyps were removed with a cold snare. Resection  and retrieval were complete.                           Scattered small-mouthed diverticula were found in                            the right colon. There was no evidence of                            diverticular bleeding.                           Multiple medium-mouthed diverticula were found in                            the left colon. There was narrowing of the colon in                            association with the diverticular opening. There                            was evidence of diverticular spasm.                            Peri-diverticular erythema was seen. There was                            evidence of an impacted diverticulum. There was no                            evidence of diverticular bleeding.                           Internal hemorrhoids were found during                            retroflexion. The hemorrhoids were large and Grade                            I (internal hemorrhoids that do not  prolapse).                           The exam was otherwise without abnormality on                            direct and retroflexion views. Complications:            No immediate complications. Estimated blood loss:                            None. Estimated Blood Loss:     Estimated blood loss: none. Impression:               - Seven 6 to 10 mm polyps in the descending colon,  in the transverse colon and in the ascending colon,                            removed with a cold snare. Resected and retrieved.                           - Mild diverticulosis in the right colon.                           - Severe diverticulosis in the left colon.                           - Internal hemorrhoids.                           - The examination was otherwise normal on direct                            and retroflexion views. Recommendation:           - Consider repeat colonoscopy after studies are                            complete for surveillance based on pathology                            results with a more extensive bowel prep vs no                            repeat due to age.                           - Patient has a contact number available for                            emergencies. The signs and symptoms of potential                            delayed complications were discussed with the                            patient. Return to normal activities tomorrow.                            Written discharge instructions were provided to the                            patient.                           - High fiber diet long term.                           - Continue present medications.                           -  Miralax once or twice daily for constipation.                           - Await pathology results. Ladene Artist, MD 08/17/2020 11:31:27 AM This report has been signed electronically.

## 2020-08-17 NOTE — Progress Notes (Signed)
Called to room to assist during endoscopic procedure.  Patient ID and intended procedure confirmed with present staff. Received instructions for my participation in the procedure from the performing physician.  

## 2020-08-17 NOTE — Progress Notes (Signed)
To PACU, VSS. Report to [email or web address] 

## 2020-08-17 NOTE — Patient Instructions (Signed)
Handout given:  High Fiber Diet, Hemorrhoids, Diverticulosis, Polyps Start High Fiber diet long term Continue present medications Take miralax once or twice daily for constipation Await pathology resutls  YOU HAD AN ENDOSCOPIC PROCEDURE TODAY AT [facility]:   Refer to the procedure report that was given to you for any specific questions about what was found during the examination.  If the procedure report does not answer your questions, please call your gastroenterologist to clarify.  If you requested that your care partner not be given the details of your procedure findings, then the procedure report has been included in a sealed envelope for you to review at your convenience later.  YOU SHOULD EXPECT: Some feelings of bloating in the abdomen. Passage of more gas than usual.  Walking can help get rid of the air that was put into your GI tract during the procedure and reduce the bloating. If you had a lower endoscopy (such as a colonoscopy or flexible sigmoidoscopy) you may notice spotting of blood in your stool or on the toilet paper. If you underwent a bowel prep for your procedure, you may not have a normal bowel movement for a few days.  Please Note:  You might notice some irritation and congestion in your nose or some drainage.  This is from the oxygen used during your procedure.  There is no need for concern and it should clear up in a day or so.  SYMPTOMS TO REPORT IMMEDIATELY:   Following lower endoscopy (colonoscopy or flexible sigmoidoscopy):  Excessive amounts of blood in the stool  Significant tenderness or worsening of abdominal pains  Swelling of the abdomen that is new, acute  Fever of 100F or higher  For urgent or emergent issues, a gastroenterologist can be reached at any hour by calling [phone]. Do not use MyChart messaging for urgent concerns.   DIET:  We do recommend a small meal at first, but then you may proceed to your regular diet.  Drink  plenty of fluids but you should avoid alcoholic beverages for 24 hours.  ACTIVITY:  You should plan to take it easy for the rest of today and you should NOT DRIVE or use heavy machinery until tomorrow (because of the sedation medicines used during the test).    FOLLOW UP: Our staff will call the number listed on your records 48-72 hours following your procedure to check on you and address any questions or concerns that you may have regarding the information given to you following your procedure. If we do not reach you, we will leave a message.  We will attempt to reach you two times.  During this call, we will ask if you have developed any symptoms of COVID 19. If you develop any symptoms (ie: fever, flu-like symptoms, shortness of breath, cough etc.) before then, please call [phone].  If you test positive for Covid 19 in the 2 weeks post procedure, please call and report this information to [place].    If any biopsies were taken you will be contacted by phone or by letter within the next 1-3 weeks.  Please call us at [phone] if you have not heard about the biopsies in 3 weeks.   SIGNATURES/CONFIDENTIALITY: You and/or your care partner have signed paperwork which will be entered into your electronic medical record.  These signatures attest to the fact that that the information above on your After Visit Summary has been reviewed and is understood.  Full responsibility of the confidentiality of  this discharge information lies with you and/or your care-partner.

## 2020-08-22 ENCOUNTER — Telehealth: Payer: Self-pay | Admitting: *Deleted

## 2020-08-22 NOTE — Telephone Encounter (Signed)
  Follow up Call-  Call back number 08/17/2020 08/09/2020  Post procedure Call Back phone  # [phone]  Permission to leave phone message No Yes  Some recent data might be hidden     Patient questions:  Do you have a fever, pain , or abdominal swelling? No. Pain Score  0 *  Have you tolerated food without any problems? Yes.    Have you been able to return to your normal activities? Yes.    Do you have any questions about your discharge instructions: Diet   No. Medications  No. Follow up visit  No.  Do you have questions or concerns about your Care? No.  Actions: * If pain score is 4 or above: 1. No action needed, pain <4.Have you developed a fever since your procedure? no  2.   Have you had an respiratory symptoms (SOB or cough) since your procedure? no  3.   Have you tested positive for COVID 19 since your procedure no  4.   Have you had any family members/close contacts diagnosed with the COVID 19 since your procedure?  no   If yes to any of these questions please route to Joylene John, RN and Joella Prince, RN

## 2020-08-23 ENCOUNTER — Telehealth: Payer: Self-pay | Admitting: Internal Medicine

## 2020-08-23 NOTE — Telephone Encounter (Signed)
  Patient requesting refill for oxyCODONE-acetaminophen (PERCOCET) 10-325 MG tablet [facility]acy [id number] [place] Last appt 05/19/20

## 2020-08-25 ENCOUNTER — Other Ambulatory Visit: Payer: Self-pay | Admitting: Internal Medicine

## 2020-08-25 DIAGNOSIS — M159 Polyosteoarthritis, unspecified: Secondary | ICD-10-CM

## 2020-08-25 DIAGNOSIS — M5416 Radiculopathy, lumbar region: Secondary | ICD-10-CM

## 2020-08-25 MED ORDER — OXYCODONE-ACETAMINOPHEN 10-325 MG PO TABS: 1.0000 | ORAL_TABLET | Freq: Four times a day (QID) | ORAL | 0 refills | Status: DC | PRN

## 2020-08-30 ENCOUNTER — Encounter: Payer: Self-pay | Admitting: Gastroenterology

## 2020-09-14 DIAGNOSIS — M47816 Spondylosis without myelopathy or radiculopathy, lumbar region: Secondary | ICD-10-CM | POA: Diagnosis not present

## 2020-09-21 ENCOUNTER — Telehealth: Payer: Self-pay | Admitting: Internal Medicine

## 2020-09-21 NOTE — Telephone Encounter (Signed)
Pt scheduled for an OV for 5/19 @ 9.20am to discuss refill.

## 2020-09-21 NOTE — Telephone Encounter (Signed)
  Patient requesting refill for oxyCODONE-acetaminophen (PERCOCET) 10-325 MG tablet [facility]acy [id number] [place], [place] - [address] Last appt 05/19/20 

## 2020-09-22 ENCOUNTER — Other Ambulatory Visit: Payer: Self-pay

## 2020-09-22 ENCOUNTER — Ambulatory Visit (INDEPENDENT_AMBULATORY_CARE_PROVIDER_SITE_OTHER): Payer: Medicare Other | Admitting: Internal Medicine

## 2020-09-22 ENCOUNTER — Encounter: Payer: Self-pay | Admitting: Internal Medicine

## 2020-09-22 VITALS — BP 132/82 | HR 90 | Temp 98.2°F | Resp 16 | Ht 69.0 in | Wt 169.0 lb

## 2020-09-22 DIAGNOSIS — M159 Polyosteoarthritis, unspecified: Secondary | ICD-10-CM

## 2020-09-22 DIAGNOSIS — I10 Essential (primary) hypertension: Secondary | ICD-10-CM

## 2020-09-22 DIAGNOSIS — M5416 Radiculopathy, lumbar region: Secondary | ICD-10-CM | POA: Diagnosis not present

## 2020-09-22 DIAGNOSIS — I739 Peripheral vascular disease, unspecified: Secondary | ICD-10-CM | POA: Diagnosis not present

## 2020-09-22 MED ORDER — OXYCODONE-ACETAMINOPHEN 10-325 MG PO TABS: 1.0000 | ORAL_TABLET | Freq: Four times a day (QID) | ORAL | 0 refills | Status: DC | PRN

## 2020-09-22 NOTE — Progress Notes (Signed)
Subjective:  Patient ID: Adrian Miller, male    DOB: 1941/03/03  Age: 80 y.o. MRN: [id number]  CC: Hypertension, Back Pain, and Osteoarthritis  This visit occurred during the SARS-CoV-2 public health emergency.  Safety protocols were in place, including screening questions prior to the visit, additional usage of staff PPE, and extensive cleaning of exam room while observing appropriate contact time as indicated for disinfecting solutions.    HPI [place] PERRA presents for f/up -   He returns for refill of oxycodone/acetaminophen.  He has chronic pain in his lower back and large joints.  He is active and denies any recent episodes of dizziness, lightheadedness, chest pain, shortness of breath, or edema.   Outpatient Medications Prior to Visit  Medication Sig Dispense Refill  . chlorthalidone (HYGROTON) 25 MG tablet TAKE 1 TABLET BY MOUTH EVERY DAY 90 tablet 0  . pantoprazole (PROTONIX) 40 MG tablet Take 1 tablet (40 mg total) by mouth daily. 90 tablet 3  . acetaminophen (TYLENOL) 325 MG tablet Take 650 mg by mouth every 6 (six) hours as needed for mild pain.    [name] oxyCODONE-acetaminophen (PERCOCET) 10-325 MG tablet Take 1 tablet by mouth every 6 (six) hours as needed for pain. 100 tablet 0   No facility-administered medications prior to visit.    ROS Review of Systems  Constitutional: Negative for diaphoresis, fatigue and unexpected weight change.  HENT: Negative.   Eyes: Negative.   Respiratory: Negative for cough, chest tightness, shortness of breath and wheezing.   Cardiovascular: Negative for chest pain, palpitations and leg swelling.  Gastrointestinal: Negative for abdominal pain, constipation and diarrhea.  Endocrine: Negative.   Genitourinary: Negative.  Negative for difficulty urinating.  Musculoskeletal: Positive for arthralgias and back pain. Negative for myalgias and neck pain.  Skin: Negative.  Negative for color change and pallor.  Neurological: Negative.  Negative for  dizziness, weakness, light-headedness and headaches.  Hematological: Negative for adenopathy. Does not bruise/bleed easily.  Psychiatric/Behavioral: Negative.     Objective:  BP 132/82 (BP Location: Left Arm, Patient Position: Sitting, Cuff Size: Large)   Pulse 90   Temp 98.2 F (36.8 C) (Oral)   Resp 16   Ht 5\' 9"  (1.753 m)   Wt 169 lb (76.7 kg)   SpO2 96%   BMI 24.96 kg/m   BP Readings from Last 3 Encounters:  09/22/20 132/82  08/17/20 117/72  08/09/20 116/73    Wt Readings from Last 3 Encounters:  09/22/20 169 lb (76.7 kg)  08/17/20 183 lb (83 kg)  08/09/20 183 lb (83 kg)    Physical Exam Vitals reviewed.  HENT:     Nose: Nose normal.     Mouth/Throat:     Mouth: Mucous membranes are moist.  Eyes:     General: No scleral icterus.    Conjunctiva/sclera: Conjunctivae normal.  Cardiovascular:     Rate and Rhythm: Normal rate and regular rhythm.     Heart sounds: No murmur heard.   Pulmonary:     Effort: Pulmonary effort is normal.     Breath sounds: No stridor. No wheezing, rhonchi or rales.  Abdominal:     General: Abdomen is flat.     Palpations: There is no mass.     Tenderness: There is no abdominal tenderness. There is no guarding.  Musculoskeletal:        General: Normal range of motion.     Cervical back: Neck supple.     Right lower leg: No edema.  Left lower leg: No edema.  Lymphadenopathy:     Cervical: No cervical adenopathy.  Skin:    General: Skin is warm and dry.  Neurological:     General: No focal deficit present.     Mental Status: He is alert.  Psychiatric:        Mood and Affect: Mood normal.        Behavior: Behavior normal.     Lab Results  Component Value Date   WBC 7.7 07/11/2020   HGB 13.7 07/11/2020   HCT 41.8 07/11/2020   PLT 404 (H) 07/11/2020   GLUCOSE 108 (H) 07/11/2020   CHOL 162 10/22/2019   TRIG 301.0 (H) 10/22/2019   HDL 29.00 (L) 10/22/2019   LDLDIRECT 47.0 10/22/2019   LDLCALC 29 12/02/2018   ALT 11  07/11/2020   AST 21 07/11/2020   NA 141 07/11/2020   K 4.0 07/11/2020   CL 105 07/11/2020   CREATININE 1.28 (H) 07/11/2020   BUN 23 07/11/2020   CO2 21 (L) 07/11/2020   TSH 3.56 10/22/2019   PSA 0.87 03/18/2014   INR 1.0 05/20/2020   HGBA1C 5.8 04/25/2020    CT ABDOMEN PELVIS W CONTRAST  Result Date: 07/11/2020 CLINICAL DATA:  Right lower quadrant pain. EXAM: CT ABDOMEN AND PELVIS WITH CONTRAST TECHNIQUE: Multidetector CT imaging of the abdomen and pelvis was performed using the standard protocol following bolus administration of intravenous contrast. CONTRAST:  165mL OMNIPAQUE IOHEXOL 300 MG/ML  SOLN COMPARISON:  November 16, 2019 FINDINGS: Lower chest: Linear scarring and/or atelectasis is seen within the right middle lobe. Hepatobiliary: There is diffuse fatty infiltration of the liver parenchyma. No focal liver abnormality is seen. Status post cholecystectomy. Mild central intrahepatic biliary dilatation is seen. The common bile duct measures 1.3 cm. Pancreas: Unremarkable. No pancreatic ductal dilatation or surrounding inflammatory changes. Spleen: Normal in size without focal abnormality. Adrenals/Urinary Tract: Adrenal glands are unremarkable. Kidneys are normal, without renal calculi or hydronephrosis. Numerous bilateral simple renal cysts of various sizes are present. Bladder is unremarkable. Stomach/Bowel: Stomach is within normal limits. Appendix appears normal. No evidence of bowel dilatation. Noninflamed diverticula are seen throughout the large bowel. Vascular/Lymphatic: Aortic atherosclerosis. No enlarged abdominal or pelvic lymph nodes. Reproductive: Moderate to marked severity prostate gland enlargement is seen. Other: No abdominal wall hernia or abnormality. No abdominopelvic ascites. Musculoskeletal: Marked severity multilevel degenerative changes seen throughout the lumbar spine. IMPRESSION: 1. Hepatic steatosis. 2. Status post cholecystectomy. 3. Numerous bilateral simple renal cysts.  4. Colonic diverticulosis. 5. Enlarged prostate gland. 6. Marked severity multilevel degenerative changes throughout the lumbar spine. 7. Aortic atherosclerosis. Aortic Atherosclerosis (ICD10-I70.0). Electronically Signed   By: Virgina Norfolk M.D.   On: 07/11/2020 12:31    Assessment & Plan:   Hagop was seen today for hypertension, back pain and osteoarthritis.  Diagnoses and all orders for this visit:  PAD (peripheral artery disease) ([place])- He is asymptomatic with this.  Benign essential HTN- His blood pressure is adequately well controlled.  Generalized OA -     oxyCODONE-acetaminophen (PERCOCET) 10-325 MG tablet; Take 1 tablet by mouth every 6 (six) hours as needed for pain.  Left lumbar radiculopathy -     oxyCODONE-acetaminophen (PERCOCET) 10-325 MG tablet; Take 1 tablet by mouth every 6 (six) hours as needed for pain.   I have discontinued Shanon Brow L. Mun's acetaminophen. I am also having him maintain his chlorthalidone, pantoprazole, and oxyCODONE-acetaminophen.  Meds ordered this encounter  Medications  . oxyCODONE-acetaminophen (PERCOCET) 10-325 MG tablet  Sig: Take 1 tablet by mouth every 6 (six) hours as needed for pain.    Dispense:  100 tablet    Refill:  0     Follow-up: No follow-ups on file.  Scarlette Calico, MD

## 2020-10-20 ENCOUNTER — Other Ambulatory Visit: Payer: Self-pay | Admitting: Internal Medicine

## 2020-10-20 ENCOUNTER — Telehealth: Payer: Self-pay | Admitting: Internal Medicine

## 2020-10-20 DIAGNOSIS — I10 Essential (primary) hypertension: Secondary | ICD-10-CM

## 2020-10-20 NOTE — Telephone Encounter (Signed)
1.Medication Requested: oxyCODONE-acetaminophen (PERCOCET) 10-325 MG tablet   2. Pharmacy (Name, Street, [place]): [id number] [place], [place]  3. On Med List: yes   4. Last Visit with PCP: 09-22-20  5. Next visit date with PCP: n/a    Agent: Please be advised that RX refills may take up to 3 business days. We ask that you follow-up with your pharmacy.

## 2020-10-21 ENCOUNTER — Other Ambulatory Visit: Payer: Self-pay | Admitting: Internal Medicine

## 2020-10-21 DIAGNOSIS — M159 Polyosteoarthritis, unspecified: Secondary | ICD-10-CM

## 2020-10-21 DIAGNOSIS — M5416 Radiculopathy, lumbar region: Secondary | ICD-10-CM

## 2020-10-21 MED ORDER — OXYCODONE-ACETAMINOPHEN 10-325 MG PO TABS: 1.0000 | ORAL_TABLET | Freq: Four times a day (QID) | ORAL | 0 refills | Status: DC | PRN

## 2020-11-15 ENCOUNTER — Telehealth: Payer: Self-pay | Admitting: Internal Medicine

## 2020-11-15 NOTE — Telephone Encounter (Signed)
Dr.Clay- [phone]

## 2020-11-15 NOTE — Telephone Encounter (Signed)
Dr.Clay Freda Munro has called from [facility] in regards to the patient.   He just wants to know more about the medical background of the patient if someone could clarify and also wants to know if he Is okay to have Antibiotic Prophylaxis  Please advise

## 2020-11-16 ENCOUNTER — Other Ambulatory Visit: Payer: Self-pay | Admitting: Internal Medicine

## 2020-11-17 NOTE — Telephone Encounter (Signed)
Dr. Freda Munro has been informed. He stated that he needed documentation giving the ok for the antibiotic   Dr. Jetty Peeks fax [phone] Copy of telephone note has been sent

## 2020-11-17 NOTE — Telephone Encounter (Signed)
Please send over the medical history  to the fax number [phone]  Also, he would like to know if the antibiotic is reccommended to give to the patient from a primary care stand point or if it is necessary before his dental treatment, not if it is okay.   Please advise

## 2020-11-18 ENCOUNTER — Encounter: Payer: Self-pay | Admitting: Internal Medicine

## 2020-11-18 NOTE — Telephone Encounter (Addendum)
Please advise on below questions. It was not included in the original incoming message.

## 2020-11-18 NOTE — Telephone Encounter (Signed)
Letter provider by PCP has been faxed to Dr. Freda Munro

## 2020-11-21 ENCOUNTER — Other Ambulatory Visit: Payer: Self-pay | Admitting: Internal Medicine

## 2020-11-21 ENCOUNTER — Telehealth: Payer: Self-pay | Admitting: Internal Medicine

## 2020-11-21 DIAGNOSIS — M159 Polyosteoarthritis, unspecified: Secondary | ICD-10-CM

## 2020-11-21 DIAGNOSIS — M5416 Radiculopathy, lumbar region: Secondary | ICD-10-CM

## 2020-11-21 MED ORDER — OXYCODONE-ACETAMINOPHEN 10-325 MG PO TABS: 1.0000 | ORAL_TABLET | Freq: Four times a day (QID) | ORAL | 0 refills | Status: DC | PRN

## 2020-11-21 NOTE — Telephone Encounter (Signed)
1.Medication Requested:oxyCODONE-acetaminophen (PERCOCET) 10-325 MG tablet  2. Pharmacy (Name, Street, City):[id number] [place] [place]  3. On Med List: yes  4. Last Visit with PCP: 09/22/20  5. Next visit date with PCP: n/a   Agent: Please be advised that RX refills may take up to 3 business days. We ask that you follow-up with your pharmacy.

## 2020-12-20 ENCOUNTER — Other Ambulatory Visit: Payer: Self-pay | Admitting: Internal Medicine

## 2020-12-20 ENCOUNTER — Telehealth: Payer: Self-pay | Admitting: Internal Medicine

## 2020-12-20 DIAGNOSIS — M5416 Radiculopathy, lumbar region: Secondary | ICD-10-CM

## 2020-12-20 DIAGNOSIS — M159 Polyosteoarthritis, unspecified: Secondary | ICD-10-CM

## 2020-12-20 DIAGNOSIS — Z79891 Long term (current) use of opiate analgesic: Secondary | ICD-10-CM

## 2020-12-20 MED ORDER — NALOXONE HCL 4 MG/0.1ML NA LIQD: 1.0000 | Freq: Once | NASAL | 2 refills | Status: AC

## 2020-12-20 MED ORDER — OXYCODONE-ACETAMINOPHEN 10-325 MG PO TABS: 1.0000 | ORAL_TABLET | Freq: Four times a day (QID) | ORAL | 0 refills | Status: DC | PRN

## 2020-12-20 NOTE — Telephone Encounter (Signed)
1.Medication Requested: oxyCODONE-acetaminophen (PERCOCET) 10-325 MG tablet  2. Pharmacy (Name, [place]): [id number] [place], [place]  Phone:  [phone] Fax:  [phone]   3. On Med List: yes  4. Last Visit with PCP: 05.19.22  5. Next visit date with PCP: n/a   Agent: Please be advised that RX refills may take up to 3 business days. We ask that you follow-up with your pharmacy.

## 2021-01-18 ENCOUNTER — Telehealth: Payer: Self-pay | Admitting: Internal Medicine

## 2021-01-18 NOTE — Telephone Encounter (Signed)
   Patient requesting refill for oxyCODONE-acetaminophen (PERCOCET) 10-325 MG tablet   [id number] [id number]

## 2021-01-19 ENCOUNTER — Other Ambulatory Visit: Payer: Self-pay | Admitting: Internal Medicine

## 2021-01-19 DIAGNOSIS — M159 Polyosteoarthritis, unspecified: Secondary | ICD-10-CM

## 2021-01-19 DIAGNOSIS — M5416 Radiculopathy, lumbar region: Secondary | ICD-10-CM

## 2021-01-19 MED ORDER — OXYCODONE-ACETAMINOPHEN 10-325 MG PO TABS: 1.0000 | ORAL_TABLET | Freq: Four times a day (QID) | ORAL | 0 refills | Status: DC | PRN

## 2021-01-20 ENCOUNTER — Emergency Department (HOSPITAL_COMMUNITY): Payer: Medicare Other

## 2021-01-20 ENCOUNTER — Emergency Department (HOSPITAL_COMMUNITY)
Admission: EM | Admit: 2021-01-20 | Discharge: 2021-01-20 | Disposition: A | Discharge status: Home / Self Care | Payer: Medicare Other | Attending: Emergency Medicine | Admitting: Emergency Medicine

## 2021-01-20 DIAGNOSIS — Z79899 Other long term (current) drug therapy: Secondary | ICD-10-CM | POA: Insufficient documentation

## 2021-01-20 DIAGNOSIS — J439 Emphysema, unspecified: Secondary | ICD-10-CM | POA: Diagnosis not present

## 2021-01-20 DIAGNOSIS — F17210 Nicotine dependence, cigarettes, uncomplicated: Secondary | ICD-10-CM | POA: Insufficient documentation

## 2021-01-20 DIAGNOSIS — S199XXA Unspecified injury of neck, initial encounter: Secondary | ICD-10-CM | POA: Diagnosis not present

## 2021-01-20 DIAGNOSIS — M542 Cervicalgia: Secondary | ICD-10-CM | POA: Diagnosis not present

## 2021-01-20 DIAGNOSIS — M19012 Primary osteoarthritis, left shoulder: Secondary | ICD-10-CM | POA: Diagnosis not present

## 2021-01-20 DIAGNOSIS — M79621 Pain in right upper arm: Secondary | ICD-10-CM | POA: Diagnosis not present

## 2021-01-20 DIAGNOSIS — I700 Atherosclerosis of aorta: Secondary | ICD-10-CM | POA: Diagnosis not present

## 2021-01-20 DIAGNOSIS — N182 Chronic kidney disease, stage 2 (mild): Secondary | ICD-10-CM | POA: Insufficient documentation

## 2021-01-20 DIAGNOSIS — Y900 Blood alcohol level of less than 20 mg/100 ml: Secondary | ICD-10-CM | POA: Diagnosis not present

## 2021-01-20 DIAGNOSIS — R4182 Altered mental status, unspecified: Secondary | ICD-10-CM | POA: Insufficient documentation

## 2021-01-20 DIAGNOSIS — R4020 Unspecified coma: Secondary | ICD-10-CM | POA: Diagnosis not present

## 2021-01-20 DIAGNOSIS — Z20822 Contact with and (suspected) exposure to covid-19: Secondary | ICD-10-CM | POA: Insufficient documentation

## 2021-01-20 DIAGNOSIS — M25512 Pain in left shoulder: Secondary | ICD-10-CM | POA: Diagnosis not present

## 2021-01-20 DIAGNOSIS — Y92410 Unspecified street and highway as the place of occurrence of the external cause: Secondary | ICD-10-CM | POA: Insufficient documentation

## 2021-01-20 DIAGNOSIS — M549 Dorsalgia, unspecified: Secondary | ICD-10-CM | POA: Diagnosis not present

## 2021-01-20 DIAGNOSIS — Z96653 Presence of artificial knee joint, bilateral: Secondary | ICD-10-CM | POA: Insufficient documentation

## 2021-01-20 DIAGNOSIS — D181 Lymphangioma, any site: Secondary | ICD-10-CM | POA: Insufficient documentation

## 2021-01-20 DIAGNOSIS — R404 Transient alteration of awareness: Secondary | ICD-10-CM | POA: Diagnosis not present

## 2021-01-20 DIAGNOSIS — M4312 Spondylolisthesis, cervical region: Secondary | ICD-10-CM | POA: Diagnosis not present

## 2021-01-20 DIAGNOSIS — I129 Hypertensive chronic kidney disease with stage 1 through stage 4 chronic kidney disease, or unspecified chronic kidney disease: Secondary | ICD-10-CM | POA: Insufficient documentation

## 2021-01-20 DIAGNOSIS — S3991XA Unspecified injury of abdomen, initial encounter: Secondary | ICD-10-CM | POA: Diagnosis not present

## 2021-01-20 DIAGNOSIS — Z041 Encounter for examination and observation following transport accident: Secondary | ICD-10-CM | POA: Diagnosis not present

## 2021-01-20 LAB — SAMPLE TO BLOOD BANK

## 2021-01-20 LAB — COMPREHENSIVE METABOLIC PANEL
ALT: 13 U/L (ref 0–44)
AST: 20 U/L (ref 15–41)
Albumin: 3.6 g/dL (ref 3.5–5.0)
Alkaline Phosphatase: 81 U/L (ref 38–126)
Anion gap: 15 (ref 5–15)
BUN: 38 mg/dL — ABNORMAL HIGH (ref 8–23)
CO2: 25 mmol/L (ref 22–32)
Calcium: 9.6 mg/dL (ref 8.9–10.3)
Chloride: 97 mmol/L — ABNORMAL LOW (ref 98–111)
Creatinine, Ser: 1.59 mg/dL — ABNORMAL HIGH (ref 0.61–1.24)
GFR, Estimated: 44 mL/min — ABNORMAL LOW (ref >60)
Glucose, Bld: 111 mg/dL — ABNORMAL HIGH (ref 70–99)
Potassium: 3.3 mmol/L — ABNORMAL LOW (ref 3.5–5.1)
Sodium: 137 mmol/L (ref 135–145)
Total Bilirubin: 0.8 mg/dL (ref 0.3–1.2)
Total Protein: 7.2 g/dL (ref 6.5–8.1)

## 2021-01-20 LAB — I-STAT CHEM 8, ED
BUN: 35 mg/dL — ABNORMAL HIGH (ref 8–23)
Calcium, Ion: 1.2 mmol/L (ref 1.15–1.40)
Chloride: 104 mmol/L (ref 98–111)
Creatinine, Ser: 1.7 mg/dL — ABNORMAL HIGH (ref 0.61–1.24)
Glucose, Bld: 108 mg/dL — ABNORMAL HIGH (ref 70–99)
HCT: 35 % — ABNORMAL LOW (ref 39.0–52.0)
Hemoglobin: 11.9 g/dL — ABNORMAL LOW (ref 13.0–17.0)
Potassium: 3.1 mmol/L — ABNORMAL LOW (ref 3.5–5.1)
Sodium: 141 mmol/L (ref 135–145)
TCO2: 27 mmol/L (ref 22–32)

## 2021-01-20 LAB — RAPID URINE DRUG SCREEN, HOSP PERFORMED
Amphetamines: NOT DETECTED
Barbiturates: NOT DETECTED
Benzodiazepines: POSITIVE — AB
Cocaine: NOT DETECTED
Opiates: POSITIVE — AB
Tetrahydrocannabinol: NOT DETECTED

## 2021-01-20 LAB — URINALYSIS, ROUTINE W REFLEX MICROSCOPIC
Bilirubin Urine: NEGATIVE
Glucose, UA: NEGATIVE mg/dL
Hgb urine dipstick: NEGATIVE
Ketones, ur: NEGATIVE mg/dL
Leukocytes,Ua: NEGATIVE
Nitrite: NEGATIVE
Protein, ur: NEGATIVE mg/dL
Specific Gravity, Urine: >1.046 — ABNORMAL HIGH (ref 1.005–1.030)
pH: 5 (ref 5.0–8.0)

## 2021-01-20 LAB — CBC
HCT: 35.9 % — ABNORMAL LOW (ref 39.0–52.0)
Hemoglobin: 11.8 g/dL — ABNORMAL LOW (ref 13.0–17.0)
MCH: 30.2 pg (ref 26.0–34.0)
MCHC: 32.9 g/dL (ref 30.0–36.0)
MCV: 91.8 fL (ref 80.0–100.0)
Platelets: 337 10*3/uL (ref 150–400)
RBC: 3.91 MIL/uL — ABNORMAL LOW (ref 4.22–5.81)
RDW: 14.8 % (ref 11.5–15.5)
WBC: 8.5 10*3/uL (ref 4.0–10.5)
nRBC: 0 % (ref 0.0–0.2)

## 2021-01-20 LAB — PROTIME-INR
INR: 1.1 (ref 0.8–1.2)
Prothrombin Time: 14 seconds (ref 11.4–15.2)

## 2021-01-20 LAB — ETHANOL: Alcohol, Ethyl (B): <10 mg/dL (ref <10)

## 2021-01-20 LAB — LACTIC ACID, PLASMA: Lactic Acid, Venous: 1.3 mmol/L (ref 0.5–1.9)

## 2021-01-20 LAB — RESP PANEL BY RT-PCR (FLU A&B, COVID) ARPGX2
Influenza A by PCR: NEGATIVE
Influenza B by PCR: NEGATIVE
SARS Coronavirus 2 by RT PCR: NEGATIVE

## 2021-01-20 MED ORDER — IOHEXOL 350 MG/ML SOLN
80.0000 mL | Freq: Once | INTRAVENOUS | Status: AC | PRN
  Administered 2021-01-20: 80 mL via INTRAVENOUS

## 2021-01-20 MED ORDER — OXYCODONE-ACETAMINOPHEN 5-325 MG PO TABS
2.0000 | ORAL_TABLET | Freq: Once | ORAL | Status: AC
Start: 2021-01-20 — End: 2021-01-20
  Administered 2021-01-20: 2 via ORAL
  Filled 2021-01-20: qty 2
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
## 2021-01-20 NOTE — ED Notes (Signed)
Pt to CT scan via stretcher w/RN on monitor.
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
## 2021-01-20 NOTE — ED Notes (Signed)
Pt returned from CT °
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
## 2021-02-02 DIAGNOSIS — R200 Anesthesia of skin: Secondary | ICD-10-CM | POA: Diagnosis not present

## 2021-02-08 DIAGNOSIS — M47816 Spondylosis without myelopathy or radiculopathy, lumbar region: Secondary | ICD-10-CM | POA: Diagnosis not present

## 2021-02-08 DIAGNOSIS — M25512 Pain in left shoulder: Secondary | ICD-10-CM | POA: Diagnosis not present

## 2021-02-08 DIAGNOSIS — R200 Anesthesia of skin: Secondary | ICD-10-CM | POA: Diagnosis not present

## 2021-02-14 ENCOUNTER — Telehealth: Payer: Self-pay | Admitting: Internal Medicine

## 2021-02-14 NOTE — Telephone Encounter (Signed)
1.Medication Requested: oxyCODONE-acetaminophen (PERCOCET) 10-325 MG tablet  2. Pharmacy (Name, [place]): [id number] [place], [place]  Phone:  [phone] Fax:  [phone]   3. On Med List: yes  4. Last Visit with PCP: 05.19.22  5. Next visit date with PCP: n/a   Agent: Please be advised that RX refills may take up to 3 business days. We ask that you follow-up with your pharmacy.

## 2021-02-15 ENCOUNTER — Other Ambulatory Visit: Payer: Self-pay | Admitting: Internal Medicine

## 2021-02-15 DIAGNOSIS — M5416 Radiculopathy, lumbar region: Secondary | ICD-10-CM

## 2021-02-15 DIAGNOSIS — M159 Polyosteoarthritis, unspecified: Secondary | ICD-10-CM

## 2021-02-15 MED ORDER — OXYCODONE-ACETAMINOPHEN 10-325 MG PO TABS: 1.0000 | ORAL_TABLET | Freq: Four times a day (QID) | ORAL | 0 refills | Status: DC | PRN

## 2021-02-16 NOTE — Progress Notes (Signed)
Subjective:    Patient ID: Adrian Miller, male    DOB: February 04, 1941, 80 y.o.   MRN: [id number]  This visit occurred during the SARS-CoV-2 public health emergency.  Safety protocols were in place, including screening questions prior to the visit, additional usage of staff PPE, and extensive cleaning of exam room while observing appropriate contact time as indicated for disinfecting solutions.    HPI The patient is here for an acute visit.  Coughing up yellow mucus - started about a couple of weeks.  He has a states some shortness of breath and wheezing.  At this point he does not have any other symptoms including fever, congestion, sinus pain, sore throat, headaches or dizziness.  He has tried a few things over-the-counter but nothing is helped with the cough.  He is still smoking.   Medications and allergies reviewed with patient and updated if appropriate.  Patient Active Problem List   Diagnosis Date Noted   Multiple gastric ulcers    Multiple duodenal ulcers    Gastrointestinal hemorrhage 05/19/2020   Routine general medical examination at a health care facility 10/25/2019   Bilateral recurrent inguinal hernia without obstruction or gangrene 10/22/2019   PAD (peripheral artery disease) ([place]) 01/22/2018   Long-term current use of opiate analgesic 10/17/2017   CKD (chronic kidney disease), stage II 09/29/2017   Encounter for long-term use of opiate analgesic 08/09/2017   Bradycardia 11/26/2016   Left lumbar radiculopathy 01/18/2016   Left carpal tunnel syndrome 12/28/2015   Hyperlipidemia with target LDL less than 130 12/26/2015   GERD (gastroesophageal reflux disease) 12/19/2015   Tobacco abuse counseling 12/19/2015   Prediabetes 06/14/2015   Generalized OA 06/14/2015   B12 deficiency anemia 06/14/2015   Other iron deficiency anemias 06/14/2015   Peptic stricture of esophagus 06/01/2015   Benign essential HTN 05/31/2015   Duodenal ulcer hemorrhage     Current Outpatient  Medications on File Prior to Visit  Medication Sig Dispense Refill   acetaminophen (TYLENOL) 325 MG tablet Take 650 mg by mouth every 6 (six) hours as needed for moderate pain.     amoxicillin (AMOXIL) 500 MG capsule Take 2,000 mg by mouth See admin instructions. 1 hour  prior to dental appointment     chlorthalidone (HYGROTON) 25 MG tablet TAKE 1 TABLET BY MOUTH EVERY DAY (Patient taking differently: Take 25 mg by mouth daily.) 90 tablet 0   ibuprofen (ADVIL) 200 MG tablet Take 400-600 mg by mouth every 6 (six) hours as needed for headache or moderate pain.     Naphazoline-Pheniramine (VISINE-A OP) Apply 1 drop to eye daily as needed (dry eyes).     oxyCODONE-acetaminophen (PERCOCET) 10-325 MG tablet Take 1 tablet by mouth every 6 (six) hours as needed for pain. 100 tablet 0   pantoprazole (PROTONIX) 40 MG tablet Take 1 tablet (40 mg total) by mouth daily. 90 tablet 3   No current facility-administered medications on file prior to visit.    Past Medical History:  Diagnosis Date   Acute esophagitis 06/01/2015   Arthritis    Benign essential HTN 05/31/2015   Duodenal ulcer hemorrhage    Esophageal stricture 06/01/2015   Gastric ulcer    Hiatal hernia 06/01/2015    Past Surgical History:  Procedure Laterality Date   BIOPSY  05/20/2020   Procedure: BIOPSY;  Surgeon: Lavena Bullion, DO;  Location: [facility];  Service: Gastroenterology;;   CHOLECYSTECTOMY N/A 09/30/2017   Procedure: LAPAROSCOPIC CHOLECYSTECTOMY WITH INTRAOPERATIVE CHOLANGIOGRAM;  Surgeon: Excell Seltzer,  [name], MD;  Location: [facility];  Service: General;  Laterality: N/A;   COLONOSCOPY     ESOPHAGOGASTRODUODENOSCOPY N/A 05/31/2015   Procedure: ESOPHAGOGASTRODUODENOSCOPY (EGD);  Surgeon: Ladene Artist, MD;  Location: [facility];  Service: Endoscopy;  Laterality: N/A;   ESOPHAGOGASTRODUODENOSCOPY (EGD) WITH PROPOFOL N/A 05/20/2020   Procedure: ESOPHAGOGASTRODUODENOSCOPY (EGD) WITH PROPOFOL;  Surgeon: Lavena Bullion, DO;   Location: [facility];  Service: Gastroenterology;  Laterality: N/A;   HERNIA REPAIR  1975   LEFT ROTATOR CUFF REPAIR  2002   REPLACEMENT TOTAL KNEE Left    REVERSE SHOULDER ARTHROPLASTY Right 03/20/2018   Procedure: RIGHT REVERSE SHOULDER ARTHROPLASTY;  Surgeon: Tania Ade, MD;  Location: [facility];  Service: Orthopedics;  Laterality: Right;   TOTAL KNEE ARTHROPLASTY Right 02/22/2015   Procedure: TOTAL KNEE ARTHROPLASTY;  Surgeon: Melrose Nakayama, MD;  Location: [facility];  Service: Orthopedics;  Laterality: Right;    Social History   Socioeconomic History   Marital status: Single    Spouse name: Not on file   Number of children: Not on file   Years of education: Not on file   Highest education level: Not on file  Occupational History   Not on file  Tobacco Use   Smoking status: Every Day    Packs/day: 0.50    Years: 51.00    Pack years: 25.50    Types: Cigarettes   Smokeless tobacco: Former    Quit date: 11/03/2014  Vaping Use   Vaping Use: Never used  Substance and Sexual Activity   Alcohol use: No    Alcohol/week: 0.0 standard drinks   Drug use: No   Sexual activity: Never  Other Topics Concern   Not on file  Social History Narrative   Not on file   Social Determinants of Health   Financial Resource Strain: Not on file  Food Insecurity: Not on file  Transportation Needs: Not on file  Physical Activity: Not on file  Stress: Not on file  Social Connections: Not on file    Family History  Problem Relation Age of Onset   Hypertension Mother    Heart attack Sister    Colon cancer Neg Hx    Esophageal cancer Neg Hx    Rectal cancer Neg Hx    Stomach cancer Neg Hx     Review of Systems  Constitutional:  Negative for fever.  HENT:  Negative for congestion, ear pain, sinus pain and sore throat.   Respiratory:  Positive for cough (productive yellow - white mucus), shortness of breath and wheezing.   Cardiovascular:  Negative for chest pain.  Neurological:   Negative for dizziness and headaches.      Objective:   Vitals:   02/17/21 1118  BP: 132/88  Pulse: 69  Temp: 98.3 F (36.8 C)  SpO2: 99%   BP Readings from Last 3 Encounters:  02/17/21 132/88  01/20/21 111/70  09/22/20 132/82   Wt Readings from Last 3 Encounters:  02/17/21 178 lb (80.7 kg)  09/22/20 169 lb (76.7 kg)  08/17/20 183 lb (83 kg)   Body mass index is 26.29 kg/m.   Physical Exam    GENERAL APPEARANCE: Appears stated age, well appearing, NAD EYES: conjunctiva clear, no icterus HENT: bilateral tympanic membranes and ear canals normal, oropharynx with no erythema or exudates, trachea midline, no cervical or supraclavicular lymphadenopathy LUNGS: Unlabored breathing, good air entry bilaterally, no crackles, slight diffuse, and expiratory wheeze CARDIOVASCULAR: Normal S1,S2 , no edema SKIN: Warm, dry  Assessment & Plan:    URI, wheezing on exam, COPD with exacerbation: Acute Symptoms ongoing for 2 weeks and cough, wheeze and shortness of breath are not improving Symptoms consistent with COPD exacerbation-given his smoking history and findings on CT scan he likely has COPD and has wheezing on exam Will start doxycycline 100 mg twice daily x10 days Depo-Medrol 80 mg IM x1 Encouraged increased fluids, rest Advised that he should be feeling better, but not on the percent within a couple of days and if he is not he needs to call or return.

## 2021-02-17 ENCOUNTER — Other Ambulatory Visit: Payer: Self-pay

## 2021-02-17 ENCOUNTER — Ambulatory Visit (INDEPENDENT_AMBULATORY_CARE_PROVIDER_SITE_OTHER): Payer: Medicare Other | Admitting: Internal Medicine

## 2021-02-17 ENCOUNTER — Encounter: Payer: Self-pay | Admitting: Internal Medicine

## 2021-02-17 VITALS — BP 132/88 | HR 69 | Temp 98.3°F | Ht 69.0 in | Wt 178.0 lb

## 2021-02-17 DIAGNOSIS — R062 Wheezing: Secondary | ICD-10-CM

## 2021-02-17 DIAGNOSIS — J069 Acute upper respiratory infection, unspecified: Secondary | ICD-10-CM

## 2021-02-17 DIAGNOSIS — J441 Chronic obstructive pulmonary disease with (acute) exacerbation: Secondary | ICD-10-CM | POA: Diagnosis not present

## 2021-02-17 MED ORDER — METHYLPREDNISOLONE ACETATE 80 MG/ML IJ SUSP
80.0000 mg | Freq: Once | INTRAMUSCULAR | Status: AC
  Administered 2021-02-17: 80 mg via INTRAMUSCULAR

## 2021-02-17 MED ORDER — DOXYCYCLINE HYCLATE 100 MG PO TABS: 100.0000 mg | ORAL_TABLET | Freq: Two times a day (BID) | ORAL | 0 refills | Status: DC

## 2021-02-17 NOTE — Patient Instructions (Addendum)
   Steroid injection given today.     Medications changes include :   doxycycline 100 mg twice a day x 10 days.   Your prescription(s) have been submitted to your pharmacy. Please take as directed and contact our office if you believe you are having problem(s) with the medication(s).    Please call if there is no improvement in your symptoms.

## 2021-02-22 DIAGNOSIS — G5622 Lesion of ulnar nerve, left upper limb: Secondary | ICD-10-CM | POA: Diagnosis not present

## 2021-02-22 DIAGNOSIS — G5601 Carpal tunnel syndrome, right upper limb: Secondary | ICD-10-CM | POA: Diagnosis not present

## 2021-02-22 DIAGNOSIS — G5602 Carpal tunnel syndrome, left upper limb: Secondary | ICD-10-CM | POA: Diagnosis not present

## 2021-02-22 DIAGNOSIS — G5621 Lesion of ulnar nerve, right upper limb: Secondary | ICD-10-CM | POA: Diagnosis not present

## 2021-02-28 DIAGNOSIS — G5621 Lesion of ulnar nerve, right upper limb: Secondary | ICD-10-CM | POA: Diagnosis not present

## 2021-02-28 DIAGNOSIS — G5601 Carpal tunnel syndrome, right upper limb: Secondary | ICD-10-CM | POA: Diagnosis not present

## 2021-02-28 DIAGNOSIS — G5622 Lesion of ulnar nerve, left upper limb: Secondary | ICD-10-CM | POA: Diagnosis not present

## 2021-02-28 DIAGNOSIS — G5602 Carpal tunnel syndrome, left upper limb: Secondary | ICD-10-CM | POA: Diagnosis not present

## 2021-03-08 ENCOUNTER — Other Ambulatory Visit: Payer: Self-pay | Admitting: Internal Medicine

## 2021-03-08 DIAGNOSIS — I10 Essential (primary) hypertension: Secondary | ICD-10-CM

## 2021-03-20 ENCOUNTER — Telehealth: Payer: Self-pay | Admitting: Internal Medicine

## 2021-03-20 NOTE — Telephone Encounter (Signed)
1.Medication Requested: oxyCODONE-acetaminophen (PERCOCET) 10-325 MG tablet   2. Pharmacy (Name, [place]): [id number] [place], [place]  Phone:  [phone] Fax:  [phone]   3. On Med List: yes  4. Last Visit with PCP: 10.14.22 ([place])  5. Next visit date with PCP: 11.17.22 (sagardia)   Agent: Please be advised that RX refills may take up to 3 business days. We ask that you follow-up with your pharmacy.

## 2021-03-21 ENCOUNTER — Other Ambulatory Visit: Payer: Self-pay | Admitting: Internal Medicine

## 2021-03-21 DIAGNOSIS — M5416 Radiculopathy, lumbar region: Secondary | ICD-10-CM

## 2021-03-21 DIAGNOSIS — I10 Essential (primary) hypertension: Secondary | ICD-10-CM

## 2021-03-21 DIAGNOSIS — M159 Polyosteoarthritis, unspecified: Secondary | ICD-10-CM

## 2021-03-21 MED ORDER — OXYCODONE-ACETAMINOPHEN 10-325 MG PO TABS: 1.0000 | ORAL_TABLET | Freq: Four times a day (QID) | ORAL | 0 refills | Status: DC | PRN

## 2021-03-23 ENCOUNTER — Encounter: Payer: Self-pay | Admitting: Emergency Medicine

## 2021-03-23 ENCOUNTER — Ambulatory Visit (INDEPENDENT_AMBULATORY_CARE_PROVIDER_SITE_OTHER): Payer: Medicare Other | Admitting: Emergency Medicine

## 2021-03-23 ENCOUNTER — Other Ambulatory Visit: Payer: Self-pay | Admitting: Emergency Medicine

## 2021-03-23 ENCOUNTER — Other Ambulatory Visit: Payer: Self-pay

## 2021-03-23 VITALS — BP 144/86 | HR 91 | Ht 69.0 in | Wt 190.0 lb

## 2021-03-23 DIAGNOSIS — I10 Essential (primary) hypertension: Secondary | ICD-10-CM

## 2021-03-23 DIAGNOSIS — Z01818 Encounter for other preprocedural examination: Secondary | ICD-10-CM | POA: Diagnosis not present

## 2021-03-23 DIAGNOSIS — M159 Polyosteoarthritis, unspecified: Secondary | ICD-10-CM | POA: Diagnosis not present

## 2021-03-23 DIAGNOSIS — E876 Hypokalemia: Secondary | ICD-10-CM

## 2021-03-23 DIAGNOSIS — Z79891 Long term (current) use of opiate analgesic: Secondary | ICD-10-CM | POA: Diagnosis not present

## 2021-03-23 DIAGNOSIS — I739 Peripheral vascular disease, unspecified: Secondary | ICD-10-CM

## 2021-03-23 DIAGNOSIS — D508 Other iron deficiency anemias: Secondary | ICD-10-CM

## 2021-03-23 LAB — COMPREHENSIVE METABOLIC PANEL
ALT: 17 U/L (ref 0–53)
AST: 25 U/L (ref 0–37)
Albumin: 4.3 g/dL (ref 3.5–5.2)
Alkaline Phosphatase: 111 U/L (ref 39–117)
BUN: 22 mg/dL (ref 6–23)
CO2: 34 mEq/L — ABNORMAL HIGH (ref 19–32)
Calcium: 9.2 mg/dL (ref 8.4–10.5)
Chloride: 97 mEq/L (ref 96–112)
Creatinine, Ser: 1.28 mg/dL (ref 0.40–1.50)
GFR: 53.09 mL/min — ABNORMAL LOW (ref >60.00)
Glucose, Bld: 89 mg/dL (ref 70–99)
Potassium: 3 mEq/L — ABNORMAL LOW (ref 3.5–5.1)
Sodium: 138 mEq/L (ref 135–145)
Total Bilirubin: 0.5 mg/dL (ref 0.2–1.2)
Total Protein: 8 g/dL (ref 6.0–8.3)

## 2021-03-23 LAB — CBC WITH DIFFERENTIAL/PLATELET
Basophils Absolute: 0.1 10*3/uL (ref 0.0–0.1)
Basophils Relative: 0.5 % (ref 0.0–3.0)
Eosinophils Absolute: 0.3 10*3/uL (ref 0.0–0.7)
Eosinophils Relative: 3 % (ref 0.0–5.0)
HCT: 36.7 % — ABNORMAL LOW (ref 39.0–52.0)
Hemoglobin: 12 g/dL — ABNORMAL LOW (ref 13.0–17.0)
Lymphocytes Relative: 25.9 % (ref 12.0–46.0)
Lymphs Abs: 2.4 10*3/uL (ref 0.7–4.0)
MCHC: 32.7 g/dL (ref 30.0–36.0)
MCV: 87 fl (ref 78.0–100.0)
Monocytes Absolute: 0.7 10*3/uL (ref 0.1–1.0)
Monocytes Relative: 8 % (ref 3.0–12.0)
Neutro Abs: 5.8 10*3/uL (ref 1.4–7.7)
Neutrophils Relative %: 62.6 % (ref 43.0–77.0)
Platelets: 407 10*3/uL — ABNORMAL HIGH (ref 150.0–400.0)
RBC: 4.22 Mil/uL (ref 4.22–5.81)
RDW: 17.7 % — ABNORMAL HIGH (ref 11.5–15.5)
WBC: 9.3 10*3/uL (ref 4.0–10.5)

## 2021-03-23 LAB — HEMOGLOBIN A1C: Hgb A1c MFr Bld: 6.2 % (ref 4.6–6.5)

## 2021-03-23 MED ORDER — POTASSIUM CHLORIDE CRYS ER 20 MEQ PO TBCR
20.0000 meq | EXTENDED_RELEASE_TABLET | Freq: Every day | ORAL | 3 refills | Status: DC
Start: 2021-03-23 — End: 2021-11-01

## 2021-03-23 NOTE — Patient Instructions (Signed)
Health Maintenance After Age 79 After age 79, you are at a higher risk for certain long-term diseases and infections as well as injuries from falls. Falls are a major cause of broken bones and head injuries in people who are older than age 79. Getting regular preventive care can help to keep you healthy and well. Preventive care includes getting regular testing and making lifestyle changes as recommended by your health care provider. Talk with your health care provider about: Which screenings and tests you should have. A screening is a test that checks for a disease when you have no symptoms. A diet and exercise plan that is right for you. What should I know about screenings and tests to prevent falls? Screening and testing are the best ways to find a health problem early. Early diagnosis and treatment give you the best chance of managing medical conditions that are common after age 79. Certain conditions and lifestyle choices may make you more likely to have a fall. Your health care provider may recommend: Regular vision checks. Poor vision and conditions such as cataracts can make you more likely to have a fall. If you wear glasses, make sure to get your prescription updated if your vision changes. Medicine review. Work with your health care provider to regularly review all of the medicines you are taking, including over-the-counter medicines. Ask your health care provider about any side effects that may make you more likely to have a fall. Tell your health care provider if any medicines that you take make you feel dizzy or sleepy. Strength and balance checks. Your health care provider may recommend certain tests to check your strength and balance while standing, walking, or changing positions. Foot health exam. Foot pain and numbness, as well as not wearing proper footwear, can make you more likely to have a fall. Screenings, including: Osteoporosis screening. Osteoporosis is a condition that causes  the bones to get weaker and break more easily. Blood pressure screening. Blood pressure changes and medicines to control blood pressure can make you feel dizzy. Depression screening. You may be more likely to have a fall if you have a fear of falling, feel depressed, or feel unable to do activities that you used to do. Alcohol use screening. Using too much alcohol can affect your balance and may make you more likely to have a fall. Follow these instructions at home: Lifestyle Do not drink alcohol if: Your health care provider tells you not to drink. If you drink alcohol: Limit how much you have to: 0-1 drink a day for women. 0-2 drinks a day for men. Know how much alcohol is in your drink. In the [place] one drink equals one 12 oz bottle of beer (355 mL), one 5 oz glass of wine (148 mL), or one 1 oz glass of hard liquor (44 mL). Do not use any products that contain nicotine or tobacco. These products include cigarettes, chewing tobacco, and vaping devices, such as e-cigarettes. If you need help quitting, ask your health care provider. Activity  Follow a regular exercise program to stay fit. This will help you maintain your balance. Ask your health care provider what types of exercise are appropriate for you. If you need a cane or walker, use it as recommended by your health care provider. Wear supportive shoes that have nonskid soles. Safety  Remove any tripping hazards, such as rugs, cords, and clutter. Install safety equipment such as grab bars in bathrooms and safety rails on stairs. Keep rooms and walkways   well-lit. General instructions Talk with your health care provider about your risks for falling. Tell your health care provider if: You fall. Be sure to tell your health care provider about all falls, even ones that seem minor. You feel dizzy, tiredness (fatigue), or off-balance. Take over-the-counter and prescription medicines only as told by your health care provider. These include  supplements. Eat a healthy diet and maintain a healthy weight. A healthy diet includes low-fat dairy products, low-fat (lean) meats, and fiber from whole grains, beans, and lots of fruits and vegetables. Stay current with your vaccines. Schedule regular health, dental, and eye exams. Summary Having a healthy lifestyle and getting preventive care can help to protect your health and wellness after age 79. Screening and testing are the best way to find a health problem early and help you avoid having a fall. Early diagnosis and treatment give you the best chance for managing medical conditions that are more common for people who are older than age 79. Falls are a major cause of broken bones and head injuries in people who are older than age 79. Take precautions to prevent a fall at home. Work with your health care provider to learn what changes you can make to improve your health and wellness and to prevent falls. This information is not intended to replace advice given to you by your health care provider. Make sure you discuss any questions you have with your health care provider. Document Revised: 09/12/2020 Document Reviewed: 09/12/2020 Elsevier Patient Education  2022 Elsevier Inc.  

## 2021-03-23 NOTE — Progress Notes (Signed)
Subjective:    Adrian Miller is a 80 y.o. male who presents to the office today for a preoperative consultation at the request of hand surgeon who plans on performing left wrist surgery on December 10. Planned anesthesia: general. The patient has the following known anesthesia issues:  Known . Patients bleeding risk: no recent abnormal bleeding. Patient does not have objections to receiving blood products if needed.  The following portions of the patient's history were reviewed and updated as appropriate: allergies, current medications, past family history, past medical history, past social history, past surgical history, and problem list.  Review of Systems A comprehensive review of systems was negative.    Objective:    BP (!) 144/86   Pulse 91   Ht 5\' 9"  (1.753 m)   Wt 190 lb (86.2 kg)   SpO2 97%   BMI 28.06 kg/m   General Appearance:    Alert, cooperative, no distress, appears stated age  Head:    Normocephalic, without obvious abnormality, atraumatic  Eyes:    PERRL, conjunctiva/corneas clear, EOM's intact,   Ears:    Normal TM's and external ear canals, both ears  Nose:   Nares normal, septum midline, mucosa normal, no drainage    or sinus tenderness  Throat:   Lips, mucosa, and tongue normal; teeth and gums normal  Neck:   Supple, symmetrical, trachea midline, no adenopathy;       thyroid:  No enlargement/tenderness/nodules; no carotid   bruit or JVD  Back:     Symmetric, no curvature, ROM normal, no CVA tenderness  Lungs:     Clear to auscultation bilaterally, respirations unlabored  Chest wall:    No tenderness or deformity  Heart:    Regular rate and rhythm, S1 and S2 normal, no murmur, rub   or gallop  Abdomen:     Soft, non-tender, bowel sounds active all four quadrants,    no masses, no organomegaly        Extremities:   Extremities normal, atraumatic, no cyanosis or edema  Pulses:   2+ and symmetric all extremities  Skin:   Skin color, texture, turgor normal, no  rashes or lesions  Lymph nodes:   Cervical, supraclavicular, and axillary nodes normal  Neurologic:   CNII-XII intact. Normal strength, sensation and reflexes      throughout    Predictors of intubation difficulty:  Morbid obesity? no  Anatomically abnormal facies? no  Prominent incisors? no  Receding mandible? no  Short, thick neck? no  Neck range of motion: abnormal   Cardiographics ECG: Sinus bradycardia with a rate of 51/min.  No acute ischemic changes. Echocardiogram: not done    Lab Review  No visits with results within 2 Month(s) from this visit.  Latest known visit with results is:  Admission on 01/20/2021, Discharged on 01/20/2021  Component Date Value   SARS Coronavirus 2 by RT* 01/20/2021 NEGATIVE    Influenza A by PCR 01/20/2021 NEGATIVE    Influenza B by PCR 01/20/2021 NEGATIVE    Sodium 01/20/2021 137    Potassium 01/20/2021 3.3 (L)    Chloride 01/20/2021 97 (L)    CO2 01/20/2021 25    Glucose, Bld 01/20/2021 111 (H)    BUN 01/20/2021 38 (H)    Creatinine, Ser 01/20/2021 1.59 (H)    Calcium 01/20/2021 9.6    Total Protein 01/20/2021 7.2    Albumin 01/20/2021 3.6    AST 01/20/2021 20    ALT 01/20/2021 13    Alkaline  Phosphatase 01/20/2021 81    Total Bilirubin 01/20/2021 0.8    GFR, Estimated 01/20/2021 44 (L)    Anion gap 01/20/2021 15    Sodium 01/20/2021 141    Potassium 01/20/2021 3.1 (L)    Chloride 01/20/2021 104    BUN 01/20/2021 35 (H)    Creatinine, Ser 01/20/2021 1.70 (H)    Glucose, Bld 01/20/2021 108 (H)    Calcium, Ion 01/20/2021 1.20    TCO2 01/20/2021 27    Hemoglobin 01/20/2021 11.9 (L)    HCT 01/20/2021 35.0 (L)    WBC 01/20/2021 8.5    RBC 01/20/2021 3.91 (L)    Hemoglobin 01/20/2021 11.8 (L)    HCT 01/20/2021 35.9 (L)    MCV 01/20/2021 91.8    MCH 01/20/2021 30.2    MCHC 01/20/2021 32.9    RDW 01/20/2021 14.8    Platelets 01/20/2021 337    nRBC 01/20/2021 0.0    Alcohol, Ethyl (B) 01/20/2021 <10    Color, Urine  01/20/2021 YELLOW    APPearance 01/20/2021 CLEAR    Specific Gravity, Urine 01/20/2021 >1.046 (H)    pH 01/20/2021 5.0    Glucose, UA 01/20/2021 NEGATIVE    Hgb urine dipstick 01/20/2021 NEGATIVE    Bilirubin Urine 01/20/2021 NEGATIVE    Ketones, ur 01/20/2021 NEGATIVE    Protein, ur 01/20/2021 NEGATIVE    Nitrite 01/20/2021 NEGATIVE    Leukocytes,Ua 01/20/2021 NEGATIVE    Lactic Acid, Venous 01/20/2021 1.3    Prothrombin Time 01/20/2021 14.0    INR 01/20/2021 1.1    Blood Bank Specimen 01/20/2021 SAMPLE AVAILABLE FOR TESTING    Sample Expiration 01/20/2021                     Value:01/21/2021,2359 Performed at [facility], [place] [address] [place], [place] [address]    Opiates 01/20/2021 POSITIVE (A)    Cocaine 01/20/2021 NONE DETECTED    Benzodiazepines 01/20/2021 POSITIVE (A)    Amphetamines 01/20/2021 NONE DETECTED    Tetrahydrocannabinol 01/20/2021 NONE DETECTED    Barbiturates 01/20/2021 NONE DETECTED       Assessment:      80 y.o. male with planned surgery as above.   Known risk factors for perioperative complications: Anemia   Difficulty with intubation is not anticipated.  Cardiac Risk Estimation: Moderate due to history of hypertension, chronic kidney disease, smoking, chronic anemia and age Medically cleared for surgery. Current medications which may produce withdrawal symptoms if withheld perioperatively: Opiates   Plan:    1. Preoperative workup as follows ECG, hemoglobin, hematocrit, electrolytes, creatinine. 2. Change in medication regimen before surgery: none, continue medication regimen including morning of surgery, with sip of water. 3. Prophylaxis for cardiac events with perioperative beta-blockers: not indicated. 4. Invasive hemodynamic monitoring perioperatively: at the discretion of anesthesiologist. 5. Deep vein thrombosis prophylaxis postoperatively:regimen to be chosen by surgical team. 6. Surveillance for postoperative MI with ECG  immediately postoperatively and on postoperative days 1 and 2 AND troponin levels 24 hours postoperatively and on day 4 or hospital discharge (whichever comes first): at the discretion of anesthesiologist.

## 2021-03-28 ENCOUNTER — Other Ambulatory Visit: Payer: Self-pay

## 2021-03-28 DIAGNOSIS — D62 Acute posthemorrhagic anemia: Secondary | ICD-10-CM

## 2021-03-28 MED ORDER — IRON 325 (65 FE) MG PO TABS: 325.0000 mg | ORAL_TABLET | Freq: Every day | ORAL | 2 refills | Status: DC

## 2021-04-06 ENCOUNTER — Telehealth: Payer: Self-pay | Admitting: Internal Medicine

## 2021-04-06 ENCOUNTER — Other Ambulatory Visit: Payer: Self-pay | Admitting: Internal Medicine

## 2021-04-06 DIAGNOSIS — I10 Essential (primary) hypertension: Secondary | ICD-10-CM

## 2021-04-06 NOTE — Telephone Encounter (Signed)
1.Medication Requested: chlorthalidone (HYGROTON) 25 MG tablet  2. Pharmacy (Name, [place]):  [id number] [place], [place]  Phone:  [phone] Fax:  [phone]   3. On Med List: yes  4. Last Visit with PCP: 11.17.22  5. Next visit date with PCP: n/a   Agent: Please be advised that RX refills may take up to 3 business days. We ask that you follow-up with your pharmacy.

## 2021-04-19 ENCOUNTER — Telehealth: Payer: Self-pay | Admitting: Internal Medicine

## 2021-04-19 NOTE — Telephone Encounter (Signed)
1.Medication Requested: oxyCODONE-acetaminophen (PERCOCET) 10-325 MG tablet  2. Pharmacy (Name, [address], [place]): [id number] [place], [place]  3. On Med List: yes   4. Last Visit with PCP: 03-23-2021 Mitchel [place])  5. Next visit date with PCP: n/a   Agent: Please be advised that RX refills may take up to 3 business days. We ask that you follow-up with your pharmacy.

## 2021-04-20 ENCOUNTER — Other Ambulatory Visit: Payer: Self-pay | Admitting: Internal Medicine

## 2021-04-20 DIAGNOSIS — I10 Essential (primary) hypertension: Secondary | ICD-10-CM

## 2021-04-24 ENCOUNTER — Other Ambulatory Visit: Payer: Self-pay | Admitting: Internal Medicine

## 2021-04-24 DIAGNOSIS — M5416 Radiculopathy, lumbar region: Secondary | ICD-10-CM

## 2021-04-24 DIAGNOSIS — M159 Polyosteoarthritis, unspecified: Secondary | ICD-10-CM

## 2021-04-24 MED ORDER — OXYCODONE-ACETAMINOPHEN 10-325 MG PO TABS
1.0000 | ORAL_TABLET | Freq: Four times a day (QID) | ORAL | 0 refills | Status: DC | PRN
Start: 2021-04-24 — End: 2021-05-22

## 2021-04-24 NOTE — Telephone Encounter (Signed)
1.Medication Requested: oxyCODONE-acetaminophen (PERCOCET) 10-325 MG tablet   2. Pharmacy (Name, [address], [place]): [id number] [place], [place]   3. On Med List: yes    4. Last Visit with PCP: 09-22-2020

## 2021-05-19 ENCOUNTER — Other Ambulatory Visit: Payer: Self-pay | Admitting: Internal Medicine

## 2021-05-19 ENCOUNTER — Telehealth: Payer: Self-pay | Admitting: Internal Medicine

## 2021-05-19 DIAGNOSIS — M5416 Radiculopathy, lumbar region: Secondary | ICD-10-CM

## 2021-05-19 DIAGNOSIS — M159 Polyosteoarthritis, unspecified: Secondary | ICD-10-CM

## 2021-05-19 NOTE — Telephone Encounter (Signed)
1.Medication Requested: oxyCODONE-acetaminophen (PERCOCET) 10-325 MG tablet  2. Pharmacy (Name, [address], [place]): [id number] [place], [place]  3. On Med List: yes  4. Last Visit with PCP: 03-23-2021 (sagardia)  5. Next visit date with PCP: n/a   Agent: Please be advised that RX refills may take up to 3 business days. We ask that you follow-up with your pharmacy.

## 2021-05-20 ENCOUNTER — Telehealth: Payer: Self-pay | Admitting: Gastroenterology

## 2021-05-20 DIAGNOSIS — K250 Acute gastric ulcer with hemorrhage: Secondary | ICD-10-CM

## 2021-05-22 ENCOUNTER — Other Ambulatory Visit: Payer: Self-pay | Admitting: Internal Medicine

## 2021-05-22 DIAGNOSIS — M159 Polyosteoarthritis, unspecified: Secondary | ICD-10-CM

## 2021-05-22 DIAGNOSIS — M5416 Radiculopathy, lumbar region: Secondary | ICD-10-CM

## 2021-05-22 MED ORDER — OXYCODONE-ACETAMINOPHEN 10-325 MG PO TABS: 1.0000 | ORAL_TABLET | Freq: Four times a day (QID) | ORAL | 0 refills | Status: DC | PRN

## 2021-05-22 NOTE — Telephone Encounter (Signed)
1.Medication Requested: oxyCODONE-acetaminophen (PERCOCET) 10-325 MG tablet  2. Pharmacy (Name, [place]): [id number] [place], [place]  Phone:  [phone] Fax:  [phone]   3. On Med List: yes  4. Last Visit with PCP: 05.19.22  5. Next visit date with PCP: 02.21.23   Agent: Please be advised that RX refills may take up to 3 business days. We ask that you follow-up with your pharmacy.

## 2021-06-10 ENCOUNTER — Other Ambulatory Visit: Payer: Self-pay

## 2021-06-10 ENCOUNTER — Observation Stay (HOSPITAL_COMMUNITY)
Admission: EM | Admit: 2021-06-10 | Discharge: 2021-06-11 | Disposition: A | Discharge status: Home / Self Care | Payer: Medicare Other | Attending: Family Medicine | Admitting: Family Medicine

## 2021-06-10 ENCOUNTER — Emergency Department (HOSPITAL_COMMUNITY): Payer: Medicare Other

## 2021-06-10 ENCOUNTER — Encounter (HOSPITAL_COMMUNITY): Payer: Self-pay

## 2021-06-10 DIAGNOSIS — K5730 Diverticulosis of large intestine without perforation or abscess without bleeding: Secondary | ICD-10-CM | POA: Diagnosis not present

## 2021-06-10 DIAGNOSIS — E876 Hypokalemia: Secondary | ICD-10-CM | POA: Diagnosis not present

## 2021-06-10 DIAGNOSIS — K5731 Diverticulosis of large intestine without perforation or abscess with bleeding: Secondary | ICD-10-CM | POA: Diagnosis not present

## 2021-06-10 DIAGNOSIS — Z96611 Presence of right artificial shoulder joint: Secondary | ICD-10-CM | POA: Diagnosis not present

## 2021-06-10 DIAGNOSIS — Z96651 Presence of right artificial knee joint: Secondary | ICD-10-CM | POA: Diagnosis not present

## 2021-06-10 DIAGNOSIS — K5791 Diverticulosis of intestine, part unspecified, without perforation or abscess with bleeding: Secondary | ICD-10-CM | POA: Diagnosis not present

## 2021-06-10 DIAGNOSIS — R9431 Abnormal electrocardiogram [ECG] [EKG]: Secondary | ICD-10-CM | POA: Diagnosis not present

## 2021-06-10 DIAGNOSIS — Z79899 Other long term (current) drug therapy: Secondary | ICD-10-CM | POA: Diagnosis not present

## 2021-06-10 DIAGNOSIS — N281 Cyst of kidney, acquired: Secondary | ICD-10-CM | POA: Diagnosis not present

## 2021-06-10 DIAGNOSIS — Z720 Tobacco use: Secondary | ICD-10-CM

## 2021-06-10 DIAGNOSIS — K625 Hemorrhage of anus and rectum: Secondary | ICD-10-CM | POA: Diagnosis not present

## 2021-06-10 DIAGNOSIS — K922 Gastrointestinal hemorrhage, unspecified: Principal | ICD-10-CM | POA: Insufficient documentation

## 2021-06-10 DIAGNOSIS — M5416 Radiculopathy, lumbar region: Secondary | ICD-10-CM | POA: Diagnosis present

## 2021-06-10 DIAGNOSIS — Z20822 Contact with and (suspected) exposure to covid-19: Secondary | ICD-10-CM | POA: Insufficient documentation

## 2021-06-10 DIAGNOSIS — R0602 Shortness of breath: Secondary | ICD-10-CM | POA: Diagnosis not present

## 2021-06-10 DIAGNOSIS — I700 Atherosclerosis of aorta: Secondary | ICD-10-CM | POA: Diagnosis not present

## 2021-06-10 DIAGNOSIS — D62 Acute posthemorrhagic anemia: Secondary | ICD-10-CM | POA: Diagnosis not present

## 2021-06-10 DIAGNOSIS — I10 Essential (primary) hypertension: Secondary | ICD-10-CM | POA: Insufficient documentation

## 2021-06-10 DIAGNOSIS — F17210 Nicotine dependence, cigarettes, uncomplicated: Secondary | ICD-10-CM | POA: Diagnosis not present

## 2021-06-10 DIAGNOSIS — K5989 Other specified functional intestinal disorders: Secondary | ICD-10-CM | POA: Diagnosis not present

## 2021-06-10 DIAGNOSIS — K5790 Diverticulosis of intestine, part unspecified, without perforation or abscess without bleeding: Secondary | ICD-10-CM | POA: Diagnosis not present

## 2021-06-10 LAB — RESP PANEL BY RT-PCR (FLU A&B, COVID) ARPGX2
Influenza A by PCR: NEGATIVE
Influenza B by PCR: NEGATIVE
SARS Coronavirus 2 by RT PCR: NEGATIVE

## 2021-06-10 LAB — TYPE AND SCREEN
ABO/RH(D): O POS
Antibody Screen: NEGATIVE

## 2021-06-10 LAB — CBC
HCT: 34.7 % — ABNORMAL LOW (ref 39.0–52.0)
Hemoglobin: 11.2 g/dL — ABNORMAL LOW (ref 13.0–17.0)
MCH: 28.5 pg (ref 26.0–34.0)
MCHC: 32.3 g/dL (ref 30.0–36.0)
MCV: 88.3 fL (ref 80.0–100.0)
Platelets: 371 10*3/uL (ref 150–400)
RBC: 3.93 MIL/uL — ABNORMAL LOW (ref 4.22–5.81)
RDW: 17.2 % — ABNORMAL HIGH (ref 11.5–15.5)
WBC: 8.8 10*3/uL (ref 4.0–10.5)
nRBC: 0 % (ref 0.0–0.2)

## 2021-06-10 LAB — COMPREHENSIVE METABOLIC PANEL
ALT: 11 U/L (ref 0–44)
AST: 19 U/L (ref 15–41)
Albumin: 3.8 g/dL (ref 3.5–5.0)
Alkaline Phosphatase: 92 U/L (ref 38–126)
Anion gap: 9 (ref 5–15)
BUN: 18 mg/dL (ref 8–23)
CO2: 24 mmol/L (ref 22–32)
Calcium: 8.7 mg/dL — ABNORMAL LOW (ref 8.9–10.3)
Chloride: 103 mmol/L (ref 98–111)
Creatinine, Ser: 1.11 mg/dL (ref 0.61–1.24)
GFR, Estimated: >60 mL/min (ref >60)
Glucose, Bld: 113 mg/dL — ABNORMAL HIGH (ref 70–99)
Potassium: 3.2 mmol/L — ABNORMAL LOW (ref 3.5–5.1)
Sodium: 136 mmol/L (ref 135–145)
Total Bilirubin: 0.4 mg/dL (ref 0.3–1.2)
Total Protein: 7.3 g/dL (ref 6.5–8.1)

## 2021-06-10 LAB — POC OCCULT BLOOD, ED: Fecal Occult Bld: POSITIVE — AB

## 2021-06-10 LAB — MAGNESIUM: Magnesium: 1.8 mg/dL (ref 1.7–2.4)

## 2021-06-10 LAB — HEMOGLOBIN AND HEMATOCRIT, BLOOD
HCT: 32.9 % — ABNORMAL LOW (ref 39.0–52.0)
Hemoglobin: 10.6 g/dL — ABNORMAL LOW (ref 13.0–17.0)

## 2021-06-10 LAB — LIPASE, BLOOD: Lipase: 38 U/L (ref 11–51)

## 2021-06-10 MED ORDER — IOHEXOL 350 MG/ML SOLN
80.0000 mL | Freq: Once | INTRAVENOUS | Status: AC | PRN
  Administered 2021-06-10: 80 mL via INTRAVENOUS

## 2021-06-10 MED ORDER — OXYCODONE HCL 5 MG PO TABS
5.0000 mg | ORAL_TABLET | ORAL | Status: DC | PRN
  Administered 2021-06-10 – 2021-06-11 (×3): 5 mg via ORAL
  Filled 2021-06-10 (×3): qty 1

## 2021-06-10 MED ORDER — NICOTINE 14 MG/24HR TD PT24
14.0000 mg | MEDICATED_PATCH | Freq: Every day | TRANSDERMAL | Status: DC
  Administered 2021-06-10 – 2021-06-11 (×2): 14 mg via TRANSDERMAL
  Filled 2021-06-10 (×2): qty 1

## 2021-06-10 MED ORDER — PANTOPRAZOLE SODIUM 40 MG PO TBEC
40.0000 mg | DELAYED_RELEASE_TABLET | Freq: Two times a day (BID) | ORAL | Status: DC
  Administered 2021-06-10 – 2021-06-11 (×2): 40 mg via ORAL
  Filled 2021-06-10 (×2): qty 1

## 2021-06-10 MED ORDER — IPRATROPIUM-ALBUTEROL 0.5-2.5 (3) MG/3ML IN SOLN
3.0000 mL | Freq: Once | RESPIRATORY_TRACT | Status: AC
  Administered 2021-06-10: 3 mL via RESPIRATORY_TRACT
  Filled 2021-06-10: qty 3

## 2021-06-10 MED ORDER — SODIUM CHLORIDE (PF) 0.9 % IJ SOLN
INTRAMUSCULAR | Status: AC
  Filled 2021-06-10: qty 50

## 2021-06-10 MED ORDER — ACETAMINOPHEN 650 MG RE SUPP: 650.0000 mg | Freq: Four times a day (QID) | RECTAL | Status: DC | PRN

## 2021-06-10 MED ORDER — IOHEXOL 300 MG/ML  SOLN
100.0000 mL | Freq: Once | INTRAMUSCULAR | Status: AC | PRN
  Administered 2021-06-10: 100 mL via INTRAVENOUS

## 2021-06-10 MED ORDER — POTASSIUM CHLORIDE CRYS ER 20 MEQ PO TBCR
40.0000 meq | EXTENDED_RELEASE_TABLET | Freq: Two times a day (BID) | ORAL | Status: DC
  Administered 2021-06-10 – 2021-06-11 (×2): 40 meq via ORAL
  Filled 2021-06-10 (×2): qty 2

## 2021-06-10 MED ORDER — ACETAMINOPHEN 325 MG PO TABS: 650.0000 mg | ORAL_TABLET | Freq: Four times a day (QID) | ORAL | Status: DC | PRN

## 2021-06-10 NOTE — H&P (Signed)
History and Physical    Patient: Adrian Miller FBP:[id number] DOB: 09-19-1940 DOA: 06/10/2021 DOS: the patient was seen and examined on 06/10/2021 PCP: Janith Lima, MD  Patient coming from: Home  Chief Complaint:  Chief Complaint  Patient presents with   Rectal Bleeding    HPI: BRNADON EOFF is a 81 y.o. male with medical history significant of HTN, chronic pain, OA, left lumbar radiculopathy, PAD. Presenting with bloody stools. Symptoms started yesterday around noon. No abdominal pain at the time. He had two episodes of BRBPR between yesterday and this morning, with another episode after he got to the ED. He denies any lightheadedness, dizziness, weakness, CP, or shortness of breath. He became concerned this morning when he still had grossly bloody stool, so he came to the ED for evaluation.   Of note, he reports frequent use of aleve and BC powders for pain. He otherwise denies any aggravating or alleviating factors.    Review of Systems: As mentioned in the history of present illness. All other systems reviewed and are negative. Past Medical History:  Diagnosis Date   Acute esophagitis 06/01/2015   Arthritis    Benign essential HTN 05/31/2015   Duodenal ulcer hemorrhage    Esophageal stricture 06/01/2015   Gastric ulcer    Hiatal hernia 06/01/2015   Past Surgical History:  Procedure Laterality Date   BIOPSY  05/20/2020   Procedure: BIOPSY;  Surgeon: Lavena Bullion, DO;  Location: [facility];  Service: Gastroenterology;;   CHOLECYSTECTOMY N/A 09/30/2017   Procedure: LAPAROSCOPIC CHOLECYSTECTOMY WITH INTRAOPERATIVE CHOLANGIOGRAM;  Surgeon: Excell Seltzer, MD;  Location: [facility];  Service: General;  Laterality: N/A;   COLONOSCOPY     ESOPHAGOGASTRODUODENOSCOPY N/A 05/31/2015   Procedure: ESOPHAGOGASTRODUODENOSCOPY (EGD);  Surgeon: Ladene Artist, MD;  Location: [facility];  Service: Endoscopy;  Laterality: N/A;   ESOPHAGOGASTRODUODENOSCOPY (EGD) WITH PROPOFOL N/A 05/20/2020    Procedure: ESOPHAGOGASTRODUODENOSCOPY (EGD) WITH PROPOFOL;  Surgeon: Lavena Bullion, DO;  Location: [facility];  Service: Gastroenterology;  Laterality: N/A;   HERNIA REPAIR  1975   LEFT ROTATOR CUFF REPAIR  2002   REPLACEMENT TOTAL KNEE Left    REVERSE SHOULDER ARTHROPLASTY Right 03/20/2018   Procedure: RIGHT REVERSE SHOULDER ARTHROPLASTY;  Surgeon: Tania Ade, MD;  Location: [facility];  Service: Orthopedics;  Laterality: Right;   TOTAL KNEE ARTHROPLASTY Right 02/22/2015   Procedure: TOTAL KNEE ARTHROPLASTY;  Surgeon: Melrose Nakayama, MD;  Location: [facility];  Service: Orthopedics;  Laterality: Right;   Social History:  reports that he has been smoking cigarettes. He has a 25.50 pack-year smoking history. He quit smokeless tobacco use about 6 years ago. He reports that he does not drink alcohol and does not use drugs.  Allergies  Allergen Reactions   Asa [Aspirin] Other (See Comments)    Angioedema- patient is unaware of this (??)    Family History  Problem Relation Age of Onset   Hypertension Mother    Heart attack Sister    Colon cancer Neg Hx    Esophageal cancer Neg Hx    Rectal cancer Neg Hx    Stomach cancer Neg Hx     Prior to Admission medications   Medication Sig Start Date End Date Taking? Authorizing Provider  amoxicillin (AMOXIL) 500 MG capsule Take 2,000 mg by mouth See admin instructions. 1 hour  prior to dental appointment 11/28/20   [provider]  chlorthalidone (HYGROTON) 25 MG tablet TAKE 1 TABLET (25 MG TOTAL) BY MOUTH DAILY. 04/20/21  Janith Lima, MD  Ferrous Sulfate (IRON) 325 (65 Fe) MG TABS Take 1 tablet (325 mg total) by mouth daily. 03/28/21   Gatha Mayer, MD  ibuprofen (ADVIL) 200 MG tablet Take 400-600 mg by mouth every 6 (six) hours as needed for headache or moderate pain.    [provider]  oxyCODONE-acetaminophen (PERCOCET) 10-325 MG tablet Take 1 tablet by mouth every 6 (six) hours as needed for pain. 05/22/21    Janith Lima, MD  pantoprazole (PROTONIX) 40 MG tablet TAKE 1 TABLET BY MOUTH EVERY DAY 05/22/21   Ladene Artist, MD  potassium chloride SA (KLOR-CON) 20 MEQ tablet Take 1 tablet (20 mEq total) by mouth daily. 03/23/21   Horald Pollen, MD    Physical Exam: Vitals:   06/10/21 1101 06/10/21 1130 06/10/21 1215 06/10/21 1341  BP: (!) 130/106 110/75 109/88 116/71  Pulse: 78  (!) 55 (!) 50  Resp: (!) 98 13 14 12   Temp:      TempSrc:      SpO2: 91% 96% 98% 99%  Weight:      Height:       General: 81 y.o. male resting in bed in NAD Eyes: PERRL, normal sclera ENMT: Nares patent w/o discharge, orophaynx clear, dentition normal, ears w/o discharge/lesions/ulcers Neck: Supple, trachea midline Cardiovascular: RRR, +S1, S2, no m/g/r, equal pulses throughout Respiratory: CTABL, no w/r/r, normal WOB GI: BS+, ND, minimal TTP LLQ, no masses noted, no organomegaly noted MSK: No e/c/c Neuro: A&O x 3, no focal deficits Psyc: Appropriate interaction and affect, calm/cooperative   Data Reviewed:  K+ 3.2 Hgb 11.2 Hemmocult positive  CT ab/pelvis w/ con: severe chronic diverticulosis EKG sinus brady, no st elevations   Assessment and Plan: No notes have been filed under this hospital service. Service: Hospitalist GIB     - place in obs, tele     - [facility] has seen, no scope planned, rec'd CTA ab/pelvis     - transfuse as needed; check q6h H&H  Hypokalemia     - replace K+, check Mg2+  PAD     - continue home regimen  HTN     - pressures are soft; holding home regimen today; reassess in AM for resuming  GERD     - PPI  Chronic pain     - avoid NSAIDs  Tobacco abuse     - counsel against further use     - nicotine patch available  Advance Care Planning: FULL  Consults: EDP consulted [facility]  Family Communication: None at bedside  Severity of Illness: The appropriate patient status for this patient is OBSERVATION. Observation status is judged to be reasonable and  necessary in order to provide the required intensity of service to ensure the patient's safety. The patient's presenting symptoms, physical exam findings, and initial radiographic and laboratory data in the context of their medical condition is felt to place them at decreased risk for further clinical deterioration. Furthermore, it is anticipated that the patient will be medically stable for discharge from the hospital within 2 midnights of admission.   Author: Jonnie Finner, DO 06/10/2021 1:56 PM  For on call review [email or web address].

## 2021-06-10 NOTE — ED Provider Notes (Signed)
[facility] DEPT Provider Note   CSN: [id number] Arrival date & time: 06/10/21  0720     History  Chief Complaint  Patient presents with   Rectal Bleeding    Adrian Miller is a 81 y.o. male.  81 year old male with medical history as detailed below presents for evaluation.  Patient presents with complaint of rectal bleeding.  Patient reports that last night he started to have bright red blood with bowel movements.  Patient complains of some mild crampy lower abdominal pain.  Patient denies prior history of GI bleeding.  No fever reported.  No nausea or vomiting reported.  The history is provided by the patient and medical records.  Rectal Bleeding Quality:  Maroon Amount:  Moderate Duration:  1 day Timing:  Constant Chronicity:  New Context: not hemorrhoids and not rectal pain   Similar prior episodes: no       Home Medications Prior to Admission medications   Medication Sig Start Date End Date Taking? Authorizing Provider  amoxicillin (AMOXIL) 500 MG capsule Take 2,000 mg by mouth See admin instructions. 1 hour  prior to dental appointment 11/28/20   [provider]  chlorthalidone (HYGROTON) 25 MG tablet TAKE 1 TABLET (25 MG TOTAL) BY MOUTH DAILY. 04/20/21   Janith Lima, MD  Ferrous Sulfate (IRON) 325 (65 Fe) MG TABS Take 1 tablet (325 mg total) by mouth daily. 03/28/21   Gatha Mayer, MD  ibuprofen (ADVIL) 200 MG tablet Take 400-600 mg by mouth every 6 (six) hours as needed for headache or moderate pain.    [provider]  oxyCODONE-acetaminophen (PERCOCET) 10-325 MG tablet Take 1 tablet by mouth every 6 (six) hours as needed for pain. 05/22/21   Janith Lima, MD  pantoprazole (PROTONIX) 40 MG tablet TAKE 1 TABLET BY MOUTH EVERY DAY 05/22/21   Ladene Artist, MD  potassium chloride SA (KLOR-CON) 20 MEQ tablet Take 1 tablet (20 mEq total) by mouth daily. 03/23/21   Horald Pollen, [place]  [aspirin]    Review of Systems   Review of Systems  Gastrointestinal:  Positive for hematochezia.  All other systems reviewed and are negative.  Physical Exam Updated Vital Signs BP (!) 130/106 (BP Location: Left Arm)    Pulse 78    Temp 98 F (36.7 C) (Oral)    Resp (!) 98    Ht 5\' 9"  (1.753 m)    Wt 83 kg    SpO2 91%    BMI 27.02 kg/m  Physical Exam Vitals and nursing note reviewed.  Constitutional:      General: He is not in acute distress.    Appearance: Normal appearance. He is well-developed.  HENT:     Head: Normocephalic and atraumatic.  Eyes:     Conjunctiva/sclera: Conjunctivae normal.     Pupils: Pupils are equal, round, and reactive to light.  Cardiovascular:     Rate and Rhythm: Normal rate and regular rhythm.     Heart sounds: Normal heart sounds.  Pulmonary:     Effort: Pulmonary effort is normal. No respiratory distress.     Breath sounds: Normal breath sounds.  Abdominal:     General: There is no distension.     Palpations: Abdomen is soft.     Tenderness: There is no abdominal tenderness.  Genitourinary:    Comments: Patient was able to defecate during exam.  Maroon-colored blood with brown stool noted.  Musculoskeletal:        General: No deformity. Normal range of motion.     Cervical back: Normal range of motion and neck supple.  Skin:    General: Skin is warm and dry.  Neurological:     General: No focal deficit present.     Mental Status: He is alert and oriented to person, place, and time.    ED Results / Procedures / Treatments   Labs (all labs ordered are listed, but only abnormal results are displayed) Labs Reviewed  COMPREHENSIVE METABOLIC PANEL - Abnormal; Notable for the following components:      Result Value   Potassium 3.2 (*)    Glucose, Bld 113 (*)    Calcium 8.7 (*)    All other components within normal limits  CBC - Abnormal; Notable for the following components:   RBC 3.93 (*)    Hemoglobin 11.2 (*)    HCT 34.7 (*)    RDW  17.2 (*)    All other components within normal limits  POC OCCULT BLOOD, ED - Abnormal; Notable for the following components:   Fecal Occult Bld POSITIVE (*)    All other components within normal limits  LIPASE, BLOOD  TYPE AND SCREEN    EKG EKG Interpretation  Date/Time:  Saturday June 10 2021 08:20:00 EST Ventricular Rate:  65 PR Interval:  173 QRS Duration: 88 QT Interval:  371 QTC Calculation: 386 R Axis:   56 Text Interpretation: Sinus rhythm Atrial premature complex Borderline low voltage, extremity leads Confirmed by Dene Gentry [phone]) on 06/10/2021 8:26:37 AM  Radiology CT ABDOMEN PELVIS W CONTRAST  Result Date: 06/10/2021 CLINICAL DATA:  81 year old male with rectal bleeding and shortness of breath. Abdominal pain. EXAM: CT ABDOMEN AND PELVIS WITH CONTRAST TECHNIQUE: Multidetector CT imaging of the abdomen and pelvis was performed using the standard protocol following bolus administration of intravenous contrast. RADIATION DOSE REDUCTION: This exam was performed according to the departmental dose-optimization program which includes automated exposure control, adjustment of the mA and/or kV according to patient size and/or use of iterative reconstruction technique. CONTRAST:  159mL OMNIPAQUE IOHEXOL 300 MG/ML  SOLN COMPARISON:  CT Abdomen and Pelvis 01/20/2021 and earlier. FINDINGS: Lower chest: Mild chronic lung base atelectasis or scarring is stable. No pericardial or pleural effusion. Hepatobiliary: Stable. Chronically absent gallbladder. Pancreas: Negative. Spleen: Negative. Adrenals/Urinary Tract: Normal adrenal glands. Stable kidneys with chronic benign appearing renal cystic disease. No hydronephrosis or pararenal inflammation. Symmetric contrast excretion on the delayed images. Unremarkable bladder. No urinary calculus identified. Stomach/Bowel: Some fluid in the rectum. Severe diverticulosis throughout the sigmoid colon, and distal descending colon. Mild to moderate  diverticulosis at the splenic flexure. Chronically indistinct sigmoid wall. No active mesenteric inflammation identified. No obvious contrast extravasation, with some diverticula chronically hyperdense when compared to September. Mild diverticulosis at the hepatic flexure. Normal retrocecal appendix. No dilated small bowel. Negative terminal ileum. Decompressed stomach and duodenum. No free air, free fluid. Vascular/Lymphatic: Tortuous aortoiliac arteries. Aortoiliac calcified atherosclerosis. Major arterial structures are patent. No lymphadenopathy. Portal venous system is patent. Reproductive: Probable mild postoperative changes from previous bilateral inguinal hernia repair. Otherwise negative. Other: No pelvic free fluid. Musculoskeletal: Chronic severe lumbar spine degeneration. Subsequent moderate and severe lumbar spinal stenosis. No acute osseous abnormality identified. IMPRESSION: 1. Severe chronic diverticulosis of the large bowel from in the distal descending and sigmoid segments. Chronically indistinct sigmoid wall with no active diverticular inflammation identified. Still, consider a diverticular source of lower GI bleeding. 2. No  other acute or inflammatory process identified in the abdomen or pelvis. Aortic Atherosclerosis (ICD10-I70.0). Severe chronic lumbar spine degeneration. Electronically Signed   By: Genevie Ann M.D.   On: 06/10/2021 10:30   DG Chest Port 1 View  Result Date: 06/10/2021 CLINICAL DATA:  81 year old male with acute rectal bleeding. Shortness of breath. EXAM: PORTABLE CHEST 1 VIEW COMPARISON:  Portable chest 01/20/2021 and earlier. FINDINGS: Portable AP semi upright view at 0903 hours. Lower lung volumes. Mediastinal contours are stable with mild thoracic aortic tortuosity. Visualized tracheal air column is within normal limits. Mild crowding of lung markings at the bases. Otherwise when allowing for portable technique the lungs are clear. Right shoulder arthroplasty and left  shoulder degeneration, postoperative changes to the acromion. No acute osseous abnormality identified. Negative visible bowel gas. IMPRESSION: Lower lung volumes.  No acute cardiopulmonary abnormality. Electronically Signed   By: Genevie Ann M.D.   On: 06/10/2021 09:24    Procedures Procedures    Medications Ordered in ED Medications  sodium chloride (PF) 0.9 % injection (has no administration in time range)  ipratropium-albuterol (DUONEB) 0.5-2.5 (3) MG/3ML nebulizer solution 3 mL (3 mLs Nebulization Given 06/10/21 0821)  iohexol (OMNIPAQUE) 300 MG/ML solution 100 mL (100 mLs Intravenous Contrast Given 06/10/21 1004)    ED Course/ Medical Decision Making/ A&P                           Medical Decision Making Amount and/or Complexity of Data Reviewed Labs: ordered. Radiology: ordered.  Risk Prescription drug management. Decision regarding hospitalization.    Medical Screen Complete  This patient presented to the ED with complaint of GI bleed.  This complaint involves an extensive number of treatment options. The initial differential diagnosis includes, but is not limited to, GI bleeding, anemia, intra-abdominal pathology, metabolic abnormality  This presentation is: Acute, Self-Limited, Previously Undiagnosed, Uncertain Prognosis, Complicated, Systemic Symptoms, and Threat to Life/Bodily Function  Patient presents with complaint of rectal bleeding.  Patient is guaiac positive with obvious blood in stool.  Patient's hemoglobin today is 11.2.  CT imaging demonstrates evidence of diverticulosis.  Patient appears to be known to [facility].  They are aware of case and will consult.  Patient would benefit from admission for further observation and work-up.  Hospitalist service is aware of case.    Additional history obtained:  External records from outside sources obtained and reviewed including prior ED visits and prior Inpatient records.    Lab Tests:  I ordered and  personally interpreted labs.  The pertinent results include: CBC cmp lipase covid   Imaging Studies ordered:  I ordered imaging studies including ct ap  I independently visualized and interpreted obtained imaging which showed diverticulosis I agree with the radiologist interpretation.   Cardiac Monitoring:  The patient was maintained on a cardiac monitor.  I personally viewed and interpreted the cardiac monitor which showed an underlying rhythm of: NSR      Problem List / ED Course:  GI bleeding   Reevaluation:  After the interventions noted above, I reevaluated the patient and found that they have: improved   Disposition:  After consideration of the diagnostic results and the patients response to treatment, I feel that the patent would benefit from admission.          Final Clinical Impression(s) / ED Diagnoses Final diagnoses:  Acute GI bleeding    Rx / DC Orders ED Discharge Orders     None  Valarie Merino, MD 06/10/21 1356

## 2021-06-10 NOTE — Consult Note (Signed)
Consultation  Referring Provider:   Dr. Dene Gentry Primary Care Physician:  Janith Lima, MD Primary Gastroenterologist:  Dr. Fuller Plan       Reason for Consultation:     Hematochezia   Impression    Hematochezia without hemodynamic compromise  No blood thinners or NSAIDS WBC 8.8 HGB 11.2 MCV 88.3 Platelets 371 06/10/2021 BUN 18 Cr 1.11  05/20/2020 Iron 62 Ferritin 50 B12 300 Last colonoscopy 08/17/2020 with 7 tubular adenomas which were resected, severe reticulosis on the left and mild on the right Patient has not had diverticular bleed in the past, has had UGI  Anemia Hemoglobin 11.2 (baseline 12-13), MCV 88.3 No elevation of BUN  History of gastric ulcers with upper GI bleed EGD 08/2020 showed healed ulcers  Hypokalemia replace  Diastolic grade 2 dysfunction Euvolemic    Plan   -With clinical presentation of large painless hematochezia, most likely this is a diverticular bleed, unlikely brisk upper GI bleed -If patient has an episode of rebleeding, please schedule for CTA and consult IR if positive. --Continue to monitor H&H with transfusion as needed to maintain hemoglobin greater than 7. -Continue PPI chronically -Avoid NSAIDs-recounseled patient -Start on fiber supplementation outpatient  Thank you for your kind consultation, we will continue to follow.         HPI:   Adrian Miller is a 81 y.o. male with past medical history significant for arthritis, hypertension, grade 2 diastolic dysfunction LV EF 60 - 65%, renal insufficiency, esophagitis, duodenal ulcers and UGI bleed secondary to NSAID use in 2017 and most recently hospitalized 05/20/20 for UGI bleed. EGD at that time showed gastric ulcer and duodenal ulcer, negative H. pylori, grade B esophagitis with esophageal stricture. Patient then had repeat endoscopy with colonoscopy 08/17/2020 Endoscopy showed healed prepyloric and duodenal ulcers, benign stenosis. Colonoscopy had seven 6 to 10 mm polyps in  descending, transverse, ascending colon, diverticulosis right and left severe on the left, internal hemorrhoids.  Pathology showed tubular adenoma, but due to age no recommendation for repeat screening colonoscopy. S/P cholecystectomy 09/30/2017. Past hernia repair and left total knee replacement surgery.   Patient lying in ER. States never had bleeding like this prior.  Started last night had 2 bowel movements that were just dark maroon blood.  Some minor abdominal cramping. Had another smaller volume bright red/maroon bowel movement and bedside commode. Patient denies reflux, nausea, vomiting. Has been on PPI once daily. Patient has chronic lower back pain and did take an Aleve yesterday and the day before as well as 1 BC powder. Denies dizziness, chest pain, shortness of breath.  ED course: Potassium 3.2, BUN 18, creatinine 1.11, magnesium 2, normal liver function Hemoglobin 11.2 (baseline 12-13), MCV 88.3, white blood cell count 8.8, platelets 371 Positive fecal occult blood CT abdomen and pelvis with contrast severe chronic diverticulosis of large bowel from distal descending and sigmoid segments.  Chronically indistinct sigmoid wall with no active diverticular inflammation.  Consider diverticular source of lower GI bleed.  Aortic atherosclerosis.  Previous GI work up: Last office visit  06/28/2020 for follow up hospital visit for GU/DU Colon 08/17/20 - Seven 6 to 10 mm polyps in the descending colon, in the transverse colon and in the ascending colon, removed with a cold snare. Resected and retrieved. - Mild diverticulosis in the right colon. - Severe diverticulosis in the left colon. - Internal hemorrhoids. - The examination was otherwise normal on direct and retroflexion views.  Repeat EGD 08/17/20 -  One benign-appearing, intrinsic mild stenosis was found at the gastroesophageal junction. This stenosis measured 1.6 cm (inner diameter) x less than one cm (in length). The  stenosis was traversed. - The exam of the esophagus was otherwise normal. - The entire examined stomach was normal. Prepyloric ulcers have completely healed. - An acquired benign-appearing, intrinsic mild stenosis was found in the duodenal bulb and was traversed. Duodenal bulb ulcers have completely healed. - The exam of the duodenum was otherwise normal.  EGD Jan 14. 2022 - LA Grade B reflux esophagitis with no bleeding. - Benign-appearing esophageal stenosis. - 2 cm hiatal hernia. - Non-bleeding gastric ulcers with no stigmata of bleeding. Biopsied. - Normal gastric fundus, gastric body and incisura. - Non-bleeding duodenal ulcers with no stigmata of bleeding. - Normal second portion of the duodenum. - Based on the endoscopic appearance of no high grade stigmata of bleeding and no active bleeding, no further endoscopic intervention was indicated in favor of continued medical management.  EGD 05/31/2015 by Dr. Fuller Plan   showed 3 nonbleeding ulcers in the duodenal bulb, esophagitis, a stricture at the GE junction and a small hiatal hernia.  H. pylori antibody was negative.  Past Medical History:  Diagnosis Date   Acute esophagitis 06/01/2015   Arthritis    Benign essential HTN 05/31/2015   Duodenal ulcer hemorrhage    Esophageal stricture 06/01/2015   Gastric ulcer    Hiatal hernia 06/01/2015    Surgical History:  He  has a past surgical history that includes LEFT ROTATOR CUFF REPAIR (2002); Hernia repair (1975); Replacement total knee (Left); Total knee arthroplasty (Right, 02/22/2015); Esophagogastroduodenoscopy (N/A, 05/31/2015); Cholecystectomy (N/A, 09/30/2017); Reverse shoulder arthroplasty (Right, 03/20/2018); Esophagogastroduodenoscopy (egd) with propofol (N/A, 05/20/2020); biopsy (05/20/2020); and Colonoscopy. Family History:  His family history includes Heart attack in his sister; Hypertension in his mother. Social History:   reports that he has been smoking cigarettes. He has a  25.50 pack-year smoking history. He quit smokeless tobacco use about 6 years ago. He reports that he does not drink alcohol and does not use drugs.  Prior to Admission medications   Medication Sig Start Date End Date Taking? Authorizing Provider  amoxicillin (AMOXIL) 500 MG capsule Take 2,000 mg by mouth See admin instructions. 1 hour  prior to dental appointment 11/28/20   [provider]  chlorthalidone (HYGROTON) 25 MG tablet TAKE 1 TABLET (25 MG TOTAL) BY MOUTH DAILY. 04/20/21   Janith Lima, MD  Ferrous Sulfate (IRON) 325 (65 Fe) MG TABS Take 1 tablet (325 mg total) by mouth daily. 03/28/21   Gatha Mayer, MD  ibuprofen (ADVIL) 200 MG tablet Take 400-600 mg by mouth every 6 (six) hours as needed for headache or moderate pain.    [provider]  oxyCODONE-acetaminophen (PERCOCET) 10-325 MG tablet Take 1 tablet by mouth every 6 (six) hours as needed for pain. 05/22/21   Janith Lima, MD  pantoprazole (PROTONIX) 40 MG tablet TAKE 1 TABLET BY MOUTH EVERY DAY 05/22/21   Ladene Artist, MD  potassium chloride SA (KLOR-CON) 20 MEQ tablet Take 1 tablet (20 mEq total) by mouth daily. 03/23/21   Horald Pollen, MD    Current Facility-Administered Medications  Medication Dose Route Frequency Provider Last Rate Last Admin   sodium chloride (PF) 0.9 % injection            Current Outpatient Medications  Medication Sig Dispense Refill   amoxicillin (AMOXIL) 500 MG capsule Take 2,000 mg by mouth See admin  instructions. 1 hour  prior to dental appointment     chlorthalidone (HYGROTON) 25 MG tablet TAKE 1 TABLET (25 MG TOTAL) BY MOUTH DAILY. 90 tablet 0   Ferrous Sulfate (IRON) 325 (65 Fe) MG TABS Take 1 tablet (325 mg total) by mouth daily. 30 tablet 2   ibuprofen (ADVIL) 200 MG tablet Take 400-600 mg by mouth every 6 (six) hours as needed for headache or moderate pain.     oxyCODONE-acetaminophen (PERCOCET) 10-325 MG tablet Take 1 tablet by mouth every 6 (six) hours as  needed for pain. 100 tablet 0   pantoprazole (PROTONIX) 40 MG tablet TAKE 1 TABLET BY MOUTH EVERY DAY 90 tablet 0   potassium chloride SA (KLOR-CON) 20 MEQ tablet Take 1 tablet (20 mEq total) by mouth daily. 30 tablet 3    Allergies as of 06/10/2021 - Review Complete 06/10/2021  Allergen Reaction Noted   Asa [aspirin] Other (See Comments) 03/06/2013    Review of Systems:    Constitutional: No weight loss, fever, chills, weakness or fatigue HEENT: Eyes: No change in vision               Ears, Nose, Throat:  No change in hearing or congestion Skin: No rash or itching Cardiovascular: No chest pain, chest pressure or palpitations   Respiratory: No SOB or cough Gastrointestinal: See HPI and otherwise negative Genitourinary: No dysuria or change in urinary frequency Neurological: No headache, dizziness or syncope Musculoskeletal: No new muscle or joint pain Hematologic: No bleeding or bruising Psychiatric: No history of depression or anxiety     Physical Exam:  Vital signs in last 24 hours: Temp:  [98 F (36.7 C)] 98 F (36.7 C) (02/04 0731) Pulse Rate:  [78-95] 78 (02/04 1101) Resp:  [13-98] 13 (02/04 1130) BP: (110-144)/(75-106) 110/75 (02/04 1130) SpO2:  [91 %-98 %] 96 % (02/04 1130) Weight:  [83 kg] 83 kg (02/04 0736)    General:   Pleasant, well developed male in no acute distress Head:  Normocephalic and atraumatic. Eyes: sclerae anicteric,conjunctive pink  Heart:  regular rate and rhythm Pulm: Clear anteriorly; no wheezing Abdomen:  Soft, Obese AB, skin exam normal, Normal bowel sounds.  no  tendernessWithout guarding and Without rebound, without hepatomegaly. Extremities:  Without edema. Msk:  Symmetrical without gross deformities. Peripheral pulses intact.  Neurologic:  Alert and  oriented x4;  grossly normal neurologically. Skin:   Dry and intact without significant lesions or rashes. Psychiatric: Demonstrates good judgement and reason without abnormal affect or  behaviors.  LAB RESULTS: Recent Labs    06/10/21 0853  WBC 8.8  HGB 11.2*  HCT 34.7*  PLT 371   BMET Recent Labs    06/10/21 0853  NA 136  K 3.2*  CL 103  CO2 24  GLUCOSE 113*  BUN 18  CREATININE 1.11  CALCIUM 8.7*   LFT Recent Labs    06/10/21 0853  PROT 7.3  ALBUMIN 3.8  AST 19  ALT 11  ALKPHOS 92  BILITOT 0.4   PT/INR No results for input(s): LABPROT, INR in the last 72 hours.  STUDIES: CT ABDOMEN PELVIS W CONTRAST  Result Date: 06/10/2021 CLINICAL DATA:  81 year old male with rectal bleeding and shortness of breath. Abdominal pain. EXAM: CT ABDOMEN AND PELVIS WITH CONTRAST TECHNIQUE: Multidetector CT imaging of the abdomen and pelvis was performed using the standard protocol following bolus administration of intravenous contrast. RADIATION DOSE REDUCTION: This exam was performed according to the departmental dose-optimization program which includes automated exposure  control, adjustment of the mA and/or kV according to patient size and/or use of iterative reconstruction technique. CONTRAST:  162mL OMNIPAQUE IOHEXOL 300 MG/ML  SOLN COMPARISON:  CT Abdomen and Pelvis 01/20/2021 and earlier. FINDINGS: Lower chest: Mild chronic lung base atelectasis or scarring is stable. No pericardial or pleural effusion. Hepatobiliary: Stable. Chronically absent gallbladder. Pancreas: Negative. Spleen: Negative. Adrenals/Urinary Tract: Normal adrenal glands. Stable kidneys with chronic benign appearing renal cystic disease. No hydronephrosis or pararenal inflammation. Symmetric contrast excretion on the delayed images. Unremarkable bladder. No urinary calculus identified. Stomach/Bowel: Some fluid in the rectum. Severe diverticulosis throughout the sigmoid colon, and distal descending colon. Mild to moderate diverticulosis at the splenic flexure. Chronically indistinct sigmoid wall. No active mesenteric inflammation identified. No obvious contrast extravasation, with some diverticula  chronically hyperdense when compared to September. Mild diverticulosis at the hepatic flexure. Normal retrocecal appendix. No dilated small bowel. Negative terminal ileum. Decompressed stomach and duodenum. No free air, free fluid. Vascular/Lymphatic: Tortuous aortoiliac arteries. Aortoiliac calcified atherosclerosis. Major arterial structures are patent. No lymphadenopathy. Portal venous system is patent. Reproductive: Probable mild postoperative changes from previous bilateral inguinal hernia repair. Otherwise negative. Other: No pelvic free fluid. Musculoskeletal: Chronic severe lumbar spine degeneration. Subsequent moderate and severe lumbar spinal stenosis. No acute osseous abnormality identified. IMPRESSION: 1. Severe chronic diverticulosis of the large bowel from in the distal descending and sigmoid segments. Chronically indistinct sigmoid wall with no active diverticular inflammation identified. Still, consider a diverticular source of lower GI bleeding. 2. No other acute or inflammatory process identified in the abdomen or pelvis. Aortic Atherosclerosis (ICD10-I70.0). Severe chronic lumbar spine degeneration. Electronically Signed   By: Genevie Ann M.D.   On: 06/10/2021 10:30   DG Chest Port 1 View  Result Date: 06/10/2021 CLINICAL DATA:  81 year old male with acute rectal bleeding. Shortness of breath. EXAM: PORTABLE CHEST 1 VIEW COMPARISON:  Portable chest 01/20/2021 and earlier. FINDINGS: Portable AP semi upright view at 0903 hours. Lower lung volumes. Mediastinal contours are stable with mild thoracic aortic tortuosity. Visualized tracheal air column is within normal limits. Mild crowding of lung markings at the bases. Otherwise when allowing for portable technique the lungs are clear. Right shoulder arthroplasty and left shoulder degeneration, postoperative changes to the acromion. No acute osseous abnormality identified. Negative visible bowel gas. IMPRESSION: Lower lung volumes.  No acute  cardiopulmonary abnormality. Electronically Signed   By: Genevie Ann M.D.   On: 06/10/2021 09:24     Vladimir Crofts  06/10/2021, 12:51 PM

## 2021-06-10 NOTE — ED Triage Notes (Signed)
Ate a gravy biscuit last night and it was really hot, then he started having rectal bleeding around 8pm last night. Bright red blood in toilet. Denies pain.

## 2021-06-10 NOTE — Plan of Care (Signed)
°  Problem: Education: Goal: Knowledge of General Education information will improve Description: Including pain rating scale, medication(s)/side effects and non-pharmacologic comfort measures Outcome: Progressing   Problem: Health Behavior/Discharge Planning: Goal: Ability to manage health-related needs will improve Outcome: Progressing   Problem: Clinical Measurements: Goal: Respiratory complications will improve Outcome: Progressing   Problem: Activity: Goal: Risk for activity intolerance will decrease Outcome: Progressing   Problem: Nutrition: Goal: Adequate nutrition will be maintained Outcome: Progressing   Problem: Pain Managment: Goal: General experience of comfort will improve Outcome: Progressing   Problem: Safety: Goal: Ability to remain free from injury will improve Outcome: Progressing   Problem: Skin Integrity: Goal: Risk for impaired skin integrity will decrease Outcome: Progressing

## 2021-06-10 NOTE — ED Notes (Signed)
ED to inpt SBAR updated, RN [place] secure chatted, no purple man to click on for handoff report.

## 2021-06-10 NOTE — Progress Notes (Signed)
Pt arrived to room 1506 via stretcher from the ED. Received report from [place], Therapist, sports. See assessment. Will continue to monitor.

## 2021-06-10 NOTE — ED Notes (Signed)
Pt able to use bedside commode , stand by assist.

## 2021-06-11 DIAGNOSIS — D62 Acute posthemorrhagic anemia: Secondary | ICD-10-CM | POA: Diagnosis not present

## 2021-06-11 DIAGNOSIS — K5731 Diverticulosis of large intestine without perforation or abscess with bleeding: Secondary | ICD-10-CM | POA: Diagnosis not present

## 2021-06-11 DIAGNOSIS — Z720 Tobacco use: Secondary | ICD-10-CM

## 2021-06-11 DIAGNOSIS — K922 Gastrointestinal hemorrhage, unspecified: Secondary | ICD-10-CM | POA: Diagnosis not present

## 2021-06-11 DIAGNOSIS — K5791 Diverticulosis of intestine, part unspecified, without perforation or abscess with bleeding: Secondary | ICD-10-CM | POA: Diagnosis not present

## 2021-06-11 LAB — CBC
HCT: 31.2 % — ABNORMAL LOW (ref 39.0–52.0)
Hemoglobin: 10 g/dL — ABNORMAL LOW (ref 13.0–17.0)
MCH: 28.7 pg (ref 26.0–34.0)
MCHC: 32.1 g/dL (ref 30.0–36.0)
MCV: 89.4 fL (ref 80.0–100.0)
Platelets: 348 10*3/uL (ref 150–400)
RBC: 3.49 MIL/uL — ABNORMAL LOW (ref 4.22–5.81)
RDW: 17 % — ABNORMAL HIGH (ref 11.5–15.5)
WBC: 8.5 10*3/uL (ref 4.0–10.5)
nRBC: 0 % (ref 0.0–0.2)

## 2021-06-11 LAB — HEMOGLOBIN AND HEMATOCRIT, BLOOD
HCT: 32.1 % — ABNORMAL LOW (ref 39.0–52.0)
Hemoglobin: 10.3 g/dL — ABNORMAL LOW (ref 13.0–17.0)

## 2021-06-11 LAB — COMPREHENSIVE METABOLIC PANEL
ALT: 10 U/L (ref 0–44)
AST: 15 U/L (ref 15–41)
Albumin: 3.6 g/dL (ref 3.5–5.0)
Alkaline Phosphatase: 90 U/L (ref 38–126)
Anion gap: 8 (ref 5–15)
BUN: 18 mg/dL (ref 8–23)
CO2: 26 mmol/L (ref 22–32)
Calcium: 8.8 mg/dL — ABNORMAL LOW (ref 8.9–10.3)
Chloride: 101 mmol/L (ref 98–111)
Creatinine, Ser: 1.13 mg/dL (ref 0.61–1.24)
GFR, Estimated: >60 mL/min (ref >60)
Glucose, Bld: 90 mg/dL (ref 70–99)
Potassium: 3.4 mmol/L — ABNORMAL LOW (ref 3.5–5.1)
Sodium: 135 mmol/L (ref 135–145)
Total Bilirubin: 0.5 mg/dL (ref 0.3–1.2)
Total Protein: 6.8 g/dL (ref 6.5–8.1)

## 2021-06-11 NOTE — Progress Notes (Signed)
AVS given and reviewed with pt and pt's daughter, Margarita Grizzle, via telephone per her request. Medications discussed. All questions answered to satisfaction. Pt verbalized understanding of information given. Pt escorted off the unit with all belongings via wheelchair by this RN.

## 2021-06-11 NOTE — Discharge Summary (Addendum)
Physician Discharge Summary   Patient: Adrian Miller MRN: [id number] DOB: [birth year]  Admit date:     06/10/2021  Discharge date: 06/11/21  Discharge Physician: Cordelia Poche, MD   PCP: Janith Lima, MD   Recommendations at discharge:   Outpatient PCP follow-up Return if recurrent bleeding  Discharge Diagnoses: Principal Problem:   GIB (gastrointestinal bleeding) Active Problems:   Benign essential HTN   Left lumbar radiculopathy   Hypokalemia   Tobacco use  Resolved Problems:   * No resolved hospital problems. *   Hospital Course: AYRON FILLINGER is a 81 y.o. male with a history of hypertension, chronic pain, osteoarthritis, PAD. Patient presented secondary to bloody stool with concern for GI bleeding. CTA scan was negative for source of bleeding. Hemoglobin stable. GI agreeable with discharge.  Assessment and Plan: * GIB (gastrointestinal bleeding)- (present on admission) Patient with bright red blood per rectum. CTA abdomen/pelvis without location of bleeding. GI consulted with no further inpatient recommendations. Hemoglobin stable. Bleeding likely secondary to known diverticulosis.  Tobacco use Counseled on admission.  Hypokalemia Potassium supplementation given.  Left lumbar radiculopathy- (present on admission) Continue home oxycodone  Benign essential HTN- (present on admission) Continue chlorthalidone           Consultants: [facility] Procedures performed: None  Disposition: Home Diet recommendation:  Regular diet  DISCHARGE MEDICATION: Allergies as of 06/11/2021       Reactions   Asa [aspirin] Other (See Comments)   Angioedema- patient is unaware of this (??)   [name] [aspirin-salicylamide-caffeine] Other (See Comments)   bleeding        Medication List     STOP taking these medications    amoxicillin 500 MG capsule Commonly known as: AMOXIL   ibuprofen 200 MG tablet Commonly known as: ADVIL       TAKE these  medications    chlorthalidone 25 MG tablet Commonly known as: HYGROTON TAKE 1 TABLET (25 MG TOTAL) BY MOUTH DAILY.   Iron 325 (65 Fe) MG Tabs Take 1 tablet (325 mg total) by mouth daily.   oxyCODONE-acetaminophen 10-325 MG tablet Commonly known as: PERCOCET Take 1 tablet by mouth every 6 (six) hours as needed for pain.   pantoprazole 40 MG tablet Commonly known as: PROTONIX TAKE 1 TABLET BY MOUTH EVERY DAY   potassium chloride SA 20 MEQ tablet Commonly known as: KLOR-CON M Take 1 tablet (20 mEq total) by mouth daily.        Follow-up Information     Janith Lima, MD. Schedule an appointment as soon as possible for a visit in 1 week(s).   Specialty: Internal Medicine Why: For hospital follow-up Contact information: [place] [address] [phone]         [facility] DEPT Follow up.   Specialty: Emergency Medicine Why: If recurrent bleeding from your rectum Contact information: [place] [id number] [place] [address] [phone]                Discharge Exam: [facility] Weights   06/10/21 0736 06/10/21 1751  Weight: 83 kg 83.8 kg   General exam: Appears calm and comfortable Respiratory system: Clear to auscultation. Respiratory effort normal. Cardiovascular system: S1 & S2 heard, RRR. No murmurs, rubs, gallops or clicks. Gastrointestinal system: Abdomen is nondistended, soft and nontender. No organomegaly or masses felt. Normal bowel sounds heard. Central nervous system: Alert and oriented. No focal neurological deficits. Musculoskeletal: No edema. No  calf tenderness Skin: No cyanosis. No rashes Psychiatry: Judgement and insight appear normal. Mood & affect appropriate.   Condition at discharge: stable  The results of significant diagnostics from this hospitalization (including imaging, microbiology, ancillary and laboratory) are listed below for reference.   Imaging  Studies: CT ABDOMEN PELVIS W CONTRAST  Result Date: 06/10/2021 CLINICAL DATA:  81 year old male with rectal bleeding and shortness of breath. Abdominal pain. EXAM: CT ABDOMEN AND PELVIS WITH CONTRAST TECHNIQUE: Multidetector CT imaging of the abdomen and pelvis was performed using the standard protocol following bolus administration of intravenous contrast. RADIATION DOSE REDUCTION: This exam was performed according to the departmental dose-optimization program which includes automated exposure control, adjustment of the mA and/or kV according to patient size and/or use of iterative reconstruction technique. CONTRAST:  182mL OMNIPAQUE IOHEXOL 300 MG/ML  SOLN COMPARISON:  CT Abdomen and Pelvis 01/20/2021 and earlier. FINDINGS: Lower chest: Mild chronic lung base atelectasis or scarring is stable. No pericardial or pleural effusion. Hepatobiliary: Stable. Chronically absent gallbladder. Pancreas: Negative. Spleen: Negative. Adrenals/Urinary Tract: Normal adrenal glands. Stable kidneys with chronic benign appearing renal cystic disease. No hydronephrosis or pararenal inflammation. Symmetric contrast excretion on the delayed images. Unremarkable bladder. No urinary calculus identified. Stomach/Bowel: Some fluid in the rectum. Severe diverticulosis throughout the sigmoid colon, and distal descending colon. Mild to moderate diverticulosis at the splenic flexure. Chronically indistinct sigmoid wall. No active mesenteric inflammation identified. No obvious contrast extravasation, with some diverticula chronically hyperdense when compared to September. Mild diverticulosis at the hepatic flexure. Normal retrocecal appendix. No dilated small bowel. Negative terminal ileum. Decompressed stomach and duodenum. No free air, free fluid. Vascular/Lymphatic: Tortuous aortoiliac arteries. Aortoiliac calcified atherosclerosis. Major arterial structures are patent. No lymphadenopathy. Portal venous system is patent. Reproductive:  Probable mild postoperative changes from previous bilateral inguinal hernia repair. Otherwise negative. Other: No pelvic free fluid. Musculoskeletal: Chronic severe lumbar spine degeneration. Subsequent moderate and severe lumbar spinal stenosis. No acute osseous abnormality identified. IMPRESSION: 1. Severe chronic diverticulosis of the large bowel from in the distal descending and sigmoid segments. Chronically indistinct sigmoid wall with no active diverticular inflammation identified. Still, consider a diverticular source of lower GI bleeding. 2. No other acute or inflammatory process identified in the abdomen or pelvis. Aortic Atherosclerosis (ICD10-I70.0). Severe chronic lumbar spine degeneration. Electronically Signed   By: Genevie Ann M.D.   On: 06/10/2021 10:30   DG Chest Port 1 View  Result Date: 06/10/2021 CLINICAL DATA:  81 year old male with acute rectal bleeding. Shortness of breath. EXAM: PORTABLE CHEST 1 VIEW COMPARISON:  Portable chest 01/20/2021 and earlier. FINDINGS: Portable AP semi upright view at 0903 hours. Lower lung volumes. Mediastinal contours are stable with mild thoracic aortic tortuosity. Visualized tracheal air column is within normal limits. Mild crowding of lung markings at the bases. Otherwise when allowing for portable technique the lungs are clear. Right shoulder arthroplasty and left shoulder degeneration, postoperative changes to the acromion. No acute osseous abnormality identified. Negative visible bowel gas. IMPRESSION: Lower lung volumes.  No acute cardiopulmonary abnormality. Electronically Signed   By: Genevie Ann M.D.   On: 06/10/2021 09:24   CT ANGIO GI BLEED  Result Date: 06/10/2021 CLINICAL DATA:  Evaluate for GI bleed. Rectal bleeding and shortness of breath. Abdominal pain. EXAM: CTA ABDOMEN AND PELVIS WITHOUT AND WITH CONTRAST TECHNIQUE: Multidetector CT imaging of the abdomen and pelvis was performed using the standard protocol during bolus administration of  intravenous contrast. Multiplanar reconstructed images and MIPs were obtained and reviewed to evaluate  the vascular anatomy. RADIATION DOSE REDUCTION: This exam was performed according to the departmental dose-optimization program which includes automated exposure control, adjustment of the mA and/or kV according to patient size and/or use of iterative reconstruction technique. CONTRAST:  6mL OMNIPAQUE IOHEXOL 350 MG/ML SOLN COMPARISON:  Earlier today. FINDINGS: VASCULAR Aorta: Aortic atherosclerosis. Normal caliber aorta without aneurysm, dissection, vasculitis or significant stenosis. Celiac: Patent without evidence of aneurysm, dissection, vasculitis or significant stenosis. SMA: Patent without evidence of aneurysm, dissection, vasculitis or significant stenosis. Renals: Both renal arteries are patent without evidence of aneurysm, dissection, vasculitis, fibromuscular dysplasia or significant stenosis. IMA: Patent without evidence of aneurysm, dissection, vasculitis or significant stenosis. Inflow: Patent without evidence of aneurysm, dissection, vasculitis or significant stenosis. Proximal Outflow: Bilateral common femoral and visualized portions of the superficial and profunda femoral arteries are patent without evidence of aneurysm, dissection, vasculitis or significant stenosis. Veins: No obvious venous abnormality within the limitations of this arterial phase study. Review of the MIP images confirms the above findings. NON-VASCULAR Lower chest: No acute abnormality. Hepatobiliary: No focal liver abnormality. Previous cholecystectomy. Chronic increase caliber of the CBD is identified measuring up to 1.2 cm. Mild intrahepatic bile duct dilatation. Pancreas: Unremarkable. No pancreatic ductal dilatation or surrounding inflammatory changes. Spleen: Normal in size without focal abnormality. Adrenals/Urinary Tract: Normal adrenal glands. No nephrolithiasis or hydronephrosis. Bilateral kidney cysts are again  identified. The largest kidney cyst arises off the anterior cortex of the interpolar left kidney measuring 6.1 cm. No hydronephrosis identified bilaterally. Urinary bladder is unremarkable. Stomach/Bowel: Stomach appears normal. No definite focal areas of intraluminal contrast extravasation to indicates the site of suspected GI bleed. There is extensive sigmoid diverticular disease without signs of acute sigmoid diverticulitis. The appendix is visualized and appears normal. Lymphatic: No significant vascular findings are present. No enlarged abdominal or pelvic lymph nodes. Reproductive: Prostate is unremarkable. Other: No free fluid or fluid collections Musculoskeletal: Lumbar degenerative disc disease. No acute or suspicious osseous findings. IMPRESSION: 1. No focal areas of intraluminal contrast extravasation/accumulation to indicate the site of suspected GI bleed. 2. Extensive sigmoid diverticular disease without signs of acute sigmoid diverticulitis. 3. Bilateral kidney cysts. 4. Chronic increase caliber of the CBD measuring up to 1.2 cm. This likely reflects post cholecystectomy physiology. 5. Aortic Atherosclerosis (ICD10-I70.0). Electronically Signed   By: Kerby Moors M.D.   On: 06/10/2021 14:42    Microbiology: Results for orders placed or performed during the hospital encounter of 06/10/21  Resp Panel by RT-PCR (Flu A&B, Covid) Nasopharyngeal Swab     Status: None   Collection Time: 06/10/21 12:41 PM   Specimen: Nasopharyngeal Swab; Nasopharyngeal(NP) swabs in vial transport medium  Result Value Ref Range Status   SARS Coronavirus 2 by RT PCR NEGATIVE NEGATIVE Final    Comment: (NOTE) SARS-CoV-2 target nucleic acids are NOT DETECTED.  The SARS-CoV-2 RNA is generally detectable in upper respiratory specimens during the acute phase of infection. The lowest concentration of SARS-CoV-2 viral copies this assay can detect is 138 copies/mL. A negative result does not preclude  SARS-Cov-2 infection and should not be used as the sole basis for treatment or other patient management decisions. A negative result may occur with  improper specimen collection/handling, submission of specimen other than nasopharyngeal swab, presence of viral mutation(s) within the areas targeted by this assay, and inadequate number of viral copies(<138 copies/mL). A negative result must be combined with clinical observations, patient history, and epidemiological information. The expected result is Negative.  Fact Sheet for Patients:  EntrepreneurPulse.com.au  Fact Sheet for Healthcare Providers:  IncredibleEmployment.be  This test is no t yet approved or cleared by the [place] FDA and  has been authorized for detection and/or diagnosis of SARS-CoV-2 by FDA under an Emergency Use Authorization (EUA). This EUA will remain  in effect (meaning this test can be used) for the duration of the COVID-19 declaration under Section 564(b)(1) of the Act, 21 U.S.C.section 360bbb-3(b)(1), unless the authorization is terminated  or revoked sooner.       Influenza A by PCR NEGATIVE NEGATIVE Final   Influenza B by PCR NEGATIVE NEGATIVE Final    Comment: (NOTE) The Xpert Xpress SARS-CoV-2/FLU/RSV plus assay is intended as an aid in the diagnosis of influenza from Nasopharyngeal swab specimens and should not be used as a sole basis for treatment. Nasal washings and aspirates are unacceptable for Xpert Xpress SARS-CoV-2/FLU/RSV testing.  Fact Sheet for Patients: EntrepreneurPulse.com.au  Fact Sheet for Healthcare Providers: IncredibleEmployment.be  This test is not yet approved or cleared by the [place] FDA and has been authorized for detection and/or diagnosis of SARS-CoV-2 by FDA under an Emergency Use Authorization (EUA). This EUA will remain in effect (meaning this test can be used) for the duration of  the COVID-19 declaration under Section 564(b)(1) of the Act, 21 U.S.C. section 360bbb-3(b)(1), unless the authorization is terminated or revoked.  Performed at [facility], [place] [address] [place], [place] [address]     Labs: CBC: Recent Labs  Lab 06/10/21 0853 06/10/21 1851 06/11/21 0037 06/11/21 0559  WBC 8.8  --   --  8.5  HGB 11.2* 10.6* 10.3* 10.0*  HCT 34.7* 32.9* 32.1* 31.2*  MCV 88.3  --   --  89.4  PLT 371  --   --  858   Basic Metabolic Panel: Recent Labs  Lab 06/10/21 0853 06/10/21 1851 06/11/21 0559  NA 136  --  135  K 3.2*  --  3.4*  CL 103  --  101  CO2 24  --  26  GLUCOSE 113*  --  90  BUN 18  --  18  CREATININE 1.11  --  1.13  CALCIUM 8.7*  --  8.8*  MG  --  1.8  --    Liver Function Tests: Recent Labs  Lab 06/10/21 0853 06/11/21 0559  AST 19 15  ALT 11 10  ALKPHOS 92 90  BILITOT 0.4 0.5  PROT 7.3 6.8  ALBUMIN 3.8 3.6    Signed: Cordelia Poche, MD [facility] 06/11/2021

## 2021-06-11 NOTE — Assessment & Plan Note (Signed)
Continue chlorthalidone.  ?

## 2021-06-11 NOTE — Discharge Instructions (Signed)
Adrian Miller,  You were in the hospital with bleeding per rectum. This has slowed down and your blood count is stable. The GI doctor has recommended discharge and you are otherwise stable. Please return if you have recurrent bleeding. Please discontinue using NSAID medications (example: ibuprofen, Advil, Goody powder, Motrin, Aleve, naproxen, etc).

## 2021-06-11 NOTE — Progress Notes (Signed)
Progress Note   Subjective  Patient feels well No abdominal pain; small bowel movement today with brown stool and minimal old blood No nausea or vomiting, tolerating full liquid diet   Objective  Vital signs in last 24 hours: Temp:  [98.5 F (36.9 C)-98.8 F (37.1 C)] 98.7 F (37.1 C) (02/05 0448) Pulse Rate:  [50-73] 65 (02/05 0448) Resp:  [12-16] 16 (02/05 0448) BP: (104-132)/(69-88) 115/69 (02/05 0448) SpO2:  [95 %-99 %] 97 % (02/05 0448) Weight:  [83.8 kg] 83.8 kg (02/04 1751) Last BM Date: 06/11/21 Gen: awake, alert, NAD HEENT: anicteric  CV: RRR, no mrg Pulm: CTA b/l Abd: soft, NT/ND, +BS throughout Ext: no c/c/e Neuro: nonfocal   Intake/Output from previous day: 02/04 0701 - 02/05 0700 In: 480 [P.O.:480] Out: 450 [Urine:450] Intake/Output this shift: Total I/O In: 480 [P.O.:480] Out: -   Lab Results: Recent Labs    06/10/21 0853 06/10/21 1851 06/11/21 0037 06/11/21 0559  WBC 8.8  --   --  8.5  HGB 11.2* 10.6* 10.3* 10.0*  HCT 34.7* 32.9* 32.1* 31.2*  PLT 371  --   --  348   BMET Recent Labs    06/10/21 0853 06/11/21 0559  NA 136 135  K 3.2* 3.4*  CL 103 101  CO2 24 26  GLUCOSE 113* 90  BUN 18 18  CREATININE 1.11 1.13  CALCIUM 8.7* 8.8*   LFT Recent Labs    06/11/21 0559  PROT 6.8  ALBUMIN 3.6  AST 15  ALT 10  ALKPHOS 90  BILITOT 0.5   PT/INR No results for input(s): LABPROT, INR in the last 72 hours. Hepatitis Panel No results for input(s): HEPBSAG, HCVAB, HEPAIGM, HEPBIGM in the last 72 hours.  Studies/Results: CT ABDOMEN PELVIS W CONTRAST  Result Date: 06/10/2021 CLINICAL DATA:  81 year old male with rectal bleeding and shortness of breath. Abdominal pain. EXAM: CT ABDOMEN AND PELVIS WITH CONTRAST TECHNIQUE: Multidetector CT imaging of the abdomen and pelvis was performed using the standard protocol following bolus administration of intravenous contrast. RADIATION DOSE REDUCTION: This exam was performed according to the  departmental dose-optimization program which includes automated exposure control, adjustment of the mA and/or kV according to patient size and/or use of iterative reconstruction technique. CONTRAST:  180mL OMNIPAQUE IOHEXOL 300 MG/ML  SOLN COMPARISON:  CT Abdomen and Pelvis 01/20/2021 and earlier. FINDINGS: Lower chest: Mild chronic lung base atelectasis or scarring is stable. No pericardial or pleural effusion. Hepatobiliary: Stable. Chronically absent gallbladder. Pancreas: Negative. Spleen: Negative. Adrenals/Urinary Tract: Normal adrenal glands. Stable kidneys with chronic benign appearing renal cystic disease. No hydronephrosis or pararenal inflammation. Symmetric contrast excretion on the delayed images. Unremarkable bladder. No urinary calculus identified. Stomach/Bowel: Some fluid in the rectum. Severe diverticulosis throughout the sigmoid colon, and distal descending colon. Mild to moderate diverticulosis at the splenic flexure. Chronically indistinct sigmoid wall. No active mesenteric inflammation identified. No obvious contrast extravasation, with some diverticula chronically hyperdense when compared to September. Mild diverticulosis at the hepatic flexure. Normal retrocecal appendix. No dilated small bowel. Negative terminal ileum. Decompressed stomach and duodenum. No free air, free fluid. Vascular/Lymphatic: Tortuous aortoiliac arteries. Aortoiliac calcified atherosclerosis. Major arterial structures are patent. No lymphadenopathy. Portal venous system is patent. Reproductive: Probable mild postoperative changes from previous bilateral inguinal hernia repair. Otherwise negative. Other: No pelvic free fluid. Musculoskeletal: Chronic severe lumbar spine degeneration. Subsequent moderate and severe lumbar spinal stenosis. No acute osseous abnormality identified. IMPRESSION: 1. Severe chronic diverticulosis of the large bowel from in the  distal descending and sigmoid segments. Chronically indistinct  sigmoid wall with no active diverticular inflammation identified. Still, consider a diverticular source of lower GI bleeding. 2. No other acute or inflammatory process identified in the abdomen or pelvis. Aortic Atherosclerosis (ICD10-I70.0). Severe chronic lumbar spine degeneration. Electronically Signed   By: Genevie Ann M.D.   On: 06/10/2021 10:30   DG Chest Port 1 View  Result Date: 06/10/2021 CLINICAL DATA:  81 year old male with acute rectal bleeding. Shortness of breath. EXAM: PORTABLE CHEST 1 VIEW COMPARISON:  Portable chest 01/20/2021 and earlier. FINDINGS: Portable AP semi upright view at 0903 hours. Lower lung volumes. Mediastinal contours are stable with mild thoracic aortic tortuosity. Visualized tracheal air column is within normal limits. Mild crowding of lung markings at the bases. Otherwise when allowing for portable technique the lungs are clear. Right shoulder arthroplasty and left shoulder degeneration, postoperative changes to the acromion. No acute osseous abnormality identified. Negative visible bowel gas. IMPRESSION: Lower lung volumes.  No acute cardiopulmonary abnormality. Electronically Signed   By: Genevie Ann M.D.   On: 06/10/2021 09:24   CT ANGIO GI BLEED  Result Date: 06/10/2021 CLINICAL DATA:  Evaluate for GI bleed. Rectal bleeding and shortness of breath. Abdominal pain. EXAM: CTA ABDOMEN AND PELVIS WITHOUT AND WITH CONTRAST TECHNIQUE: Multidetector CT imaging of the abdomen and pelvis was performed using the standard protocol during bolus administration of intravenous contrast. Multiplanar reconstructed images and MIPs were obtained and reviewed to evaluate the vascular anatomy. RADIATION DOSE REDUCTION: This exam was performed according to the departmental dose-optimization program which includes automated exposure control, adjustment of the mA and/or kV according to patient size and/or use of iterative reconstruction technique. CONTRAST:  53mL OMNIPAQUE IOHEXOL 350 MG/ML SOLN  COMPARISON:  Earlier today. FINDINGS: VASCULAR Aorta: Aortic atherosclerosis. Normal caliber aorta without aneurysm, dissection, vasculitis or significant stenosis. Celiac: Patent without evidence of aneurysm, dissection, vasculitis or significant stenosis. SMA: Patent without evidence of aneurysm, dissection, vasculitis or significant stenosis. Renals: Both renal arteries are patent without evidence of aneurysm, dissection, vasculitis, fibromuscular dysplasia or significant stenosis. IMA: Patent without evidence of aneurysm, dissection, vasculitis or significant stenosis. Inflow: Patent without evidence of aneurysm, dissection, vasculitis or significant stenosis. Proximal Outflow: Bilateral common femoral and visualized portions of the superficial and profunda femoral arteries are patent without evidence of aneurysm, dissection, vasculitis or significant stenosis. Veins: No obvious venous abnormality within the limitations of this arterial phase study. Review of the MIP images confirms the above findings. NON-VASCULAR Lower chest: No acute abnormality. Hepatobiliary: No focal liver abnormality. Previous cholecystectomy. Chronic increase caliber of the CBD is identified measuring up to 1.2 cm. Mild intrahepatic bile duct dilatation. Pancreas: Unremarkable. No pancreatic ductal dilatation or surrounding inflammatory changes. Spleen: Normal in size without focal abnormality. Adrenals/Urinary Tract: Normal adrenal glands. No nephrolithiasis or hydronephrosis. Bilateral kidney cysts are again identified. The largest kidney cyst arises off the anterior cortex of the interpolar left kidney measuring 6.1 cm. No hydronephrosis identified bilaterally. Urinary bladder is unremarkable. Stomach/Bowel: Stomach appears normal. No definite focal areas of intraluminal contrast extravasation to indicates the site of suspected GI bleed. There is extensive sigmoid diverticular disease without signs of acute sigmoid diverticulitis. The  appendix is visualized and appears normal. Lymphatic: No significant vascular findings are present. No enlarged abdominal or pelvic lymph nodes. Reproductive: Prostate is unremarkable. Other: No free fluid or fluid collections Musculoskeletal: Lumbar degenerative disc disease. No acute or suspicious osseous findings. IMPRESSION: 1. No focal areas of intraluminal contrast extravasation/accumulation to  indicate the site of suspected GI bleed. 2. Extensive sigmoid diverticular disease without signs of acute sigmoid diverticulitis. 3. Bilateral kidney cysts. 4. Chronic increase caliber of the CBD measuring up to 1.2 cm. This likely reflects post cholecystectomy physiology. 5. Aortic Atherosclerosis (ICD10-I70.0). Electronically Signed   By: Kerby Moors M.D.   On: 06/10/2021 14:42      Assessment & Recommendations  81 year old male admitted with presumed diverticular hemorrhage  1.  Diverticular bleed --hemodynamically stable.  CT angiography of the abdomen pelvis negative for active extravasation/bleeding.  Hemoglobin stable now over the last 3 checks.  He lost about 2 g of hemoglobin overall. --I advanced diet to regular --If no further bleeding and stable hemoglobin I expect he may be able to go home tomorrow; perhaps later today? --I did tell him to avoid BC powder which he was using for arthritic pain --I let him know if he sees recurrent red or maroon stools to notify his nurse while he is here; and [facility] or primary care if it occurs again after discharge.  I also let him know that when he does go home if he has recurrent bleeding he will need to come immediately back to the hospital.  He voiced understanding  2.  Hypokalemia --mild and only slightly low at 3.4.  Monitor, defer supplementation to hospitalist team  3.  GERD -chronic PPI, continue  GI will sign off, call if questions      LOS: 0 days   Adrian Miller  06/11/2021, 11:57 AM See Shea Evans, [facility], to contact our on call  provider

## 2021-06-11 NOTE — Assessment & Plan Note (Signed)
-   Continue home oxycodone 

## 2021-06-11 NOTE — Assessment & Plan Note (Signed)
Potassium supplementation given. 

## 2021-06-11 NOTE — Hospital Course (Signed)
Adrian Miller is a 81 y.o. male with a history of hypertension, chronic pain, osteoarthritis, PAD. Patient presented secondary to bloody stool with concern for GI bleeding. CTA scan was negative for source of bleeding. Hemoglobin stable. GI agreeable with discharge.

## 2021-06-11 NOTE — Assessment & Plan Note (Addendum)
Patient with bright red blood per rectum. CTA abdomen/pelvis without location of bleeding. GI consulted with no further inpatient recommendations. Hemoglobin stable. Bleeding likely secondary to known diverticulosis.

## 2021-06-11 NOTE — Plan of Care (Signed)
°  Problem: Education: Goal: Knowledge of General Education information will improve Description: Including pain rating scale, medication(s)/side effects and non-pharmacologic comfort measures Outcome: Progressing   Problem: Health Behavior/Discharge Planning: Goal: Ability to manage health-related needs will improve Outcome: Progressing   Problem: Clinical Measurements: Goal: Ability to maintain clinical measurements within normal limits will improve Outcome: Progressing Goal: Diagnostic test results will improve Outcome: Progressing   Problem: Activity: Goal: Risk for activity intolerance will decrease Outcome: Progressing   Problem: Nutrition: Goal: Adequate nutrition will be maintained Outcome: Progressing   Problem: Elimination: Goal: Will not experience complications related to bowel motility Outcome: Progressing Goal: Will not experience complications related to urinary retention Outcome: Progressing   Problem: Pain Managment: Goal: General experience of comfort will improve Outcome: Progressing   Problem: Safety: Goal: Ability to remain free from injury will improve Outcome: Progressing   Problem: Skin Integrity: Goal: Risk for impaired skin integrity will decrease Outcome: Progressing   

## 2021-06-11 NOTE — Assessment & Plan Note (Signed)
Counseled on admission.

## 2021-06-20 ENCOUNTER — Telehealth: Payer: Self-pay

## 2021-06-20 DIAGNOSIS — M5416 Radiculopathy, lumbar region: Secondary | ICD-10-CM

## 2021-06-20 DIAGNOSIS — M159 Polyosteoarthritis, unspecified: Secondary | ICD-10-CM

## 2021-06-20 MED ORDER — OXYCODONE-ACETAMINOPHEN 10-325 MG PO TABS: 1.0000 | ORAL_TABLET | Freq: Four times a day (QID) | ORAL | 0 refills | Status: DC | PRN

## 2021-06-20 NOTE — Telephone Encounter (Signed)
Pt is calling for a refill on: oxyCODONE-acetaminophen (PERCOCET) 10-325 MG tablet  Pharmacy: [id number] - [address], [place] 03/23/21

## 2021-06-27 ENCOUNTER — Ambulatory Visit (INDEPENDENT_AMBULATORY_CARE_PROVIDER_SITE_OTHER): Payer: Medicare Other | Admitting: Internal Medicine

## 2021-06-27 ENCOUNTER — Other Ambulatory Visit: Payer: Self-pay

## 2021-06-27 ENCOUNTER — Encounter: Payer: Self-pay | Admitting: Internal Medicine

## 2021-06-27 VITALS — BP 128/78 | HR 82 | Temp 97.9°F | Ht 69.0 in | Wt 180.0 lb

## 2021-06-27 DIAGNOSIS — D510 Vitamin B12 deficiency anemia due to intrinsic factor deficiency: Secondary | ICD-10-CM | POA: Diagnosis not present

## 2021-06-27 DIAGNOSIS — I10 Essential (primary) hypertension: Secondary | ICD-10-CM

## 2021-06-27 DIAGNOSIS — N182 Chronic kidney disease, stage 2 (mild): Secondary | ICD-10-CM | POA: Diagnosis not present

## 2021-06-27 DIAGNOSIS — Z23 Encounter for immunization: Secondary | ICD-10-CM | POA: Diagnosis not present

## 2021-06-27 DIAGNOSIS — D508 Other iron deficiency anemias: Secondary | ICD-10-CM

## 2021-06-27 LAB — CBC WITH DIFFERENTIAL/PLATELET
Basophils Absolute: 0.1 10*3/uL (ref 0.0–0.1)
Basophils Relative: 0.5 % (ref 0.0–3.0)
Eosinophils Absolute: 0.2 10*3/uL (ref 0.0–0.7)
Eosinophils Relative: 1.5 % (ref 0.0–5.0)
HCT: 35.5 % — ABNORMAL LOW (ref 39.0–52.0)
Hemoglobin: 11.5 g/dL — ABNORMAL LOW (ref 13.0–17.0)
Lymphocytes Relative: 22.1 % (ref 12.0–46.0)
Lymphs Abs: 2.6 10*3/uL (ref 0.7–4.0)
MCHC: 32.5 g/dL (ref 30.0–36.0)
MCV: 86.1 fl (ref 78.0–100.0)
Monocytes Absolute: 0.9 10*3/uL (ref 0.1–1.0)
Monocytes Relative: 8 % (ref 3.0–12.0)
Neutro Abs: 8 10*3/uL — ABNORMAL HIGH (ref 1.4–7.7)
Neutrophils Relative %: 67.9 % (ref 43.0–77.0)
Platelets: 519 10*3/uL — ABNORMAL HIGH (ref 150.0–400.0)
RBC: 4.12 Mil/uL — ABNORMAL LOW (ref 4.22–5.81)
RDW: 16 % — ABNORMAL HIGH (ref 11.5–15.5)
WBC: 11.7 10*3/uL — ABNORMAL HIGH (ref 4.0–10.5)

## 2021-06-27 LAB — FOLATE: Folate: 8.6 ng/mL (ref >5.9)

## 2021-06-27 MED ORDER — CHLORTHALIDONE 25 MG PO TABS: 25.0000 mg | ORAL_TABLET | Freq: Every day | ORAL | 0 refills | Status: DC

## 2021-06-27 MED ORDER — CYANOCOBALAMIN 1000 MCG/ML IJ SOLN
1000.0000 ug | Freq: Once | INTRAMUSCULAR | Status: AC
  Administered 2021-06-27: 1000 ug via INTRAMUSCULAR

## 2021-06-27 NOTE — Progress Notes (Signed)
Subjective:  Patient ID: Adrian Miller, male    DOB: [birth year]  Age: 81 y.o. MRN: [id number]  CC: Anemia  This visit occurred during the SARS-CoV-2 public health emergency.  Safety protocols were in place, including screening questions prior to the visit, additional usage of staff PPE, and extensive cleaning of exam room while observing appropriate contact time as indicated for disinfecting solutions.    HPI KARRON ALVIZO presents for f/up -  He was recently admitted for another lower GI bleed.  He had gone somewhere without taking Percocet and was experiencing musculoskeletal pain so he took a few BC powders.  He is no longer taking BC powders.  He denies bright red blood per rectum, melena, abdominal pain, nausea, vomiting, dizziness, or lightheadedness.   Outpatient Medications Prior to Visit  Medication Sig Dispense Refill   oxyCODONE-acetaminophen (PERCOCET) 10-325 MG tablet Take 1 tablet by mouth every 6 (six) hours as needed for pain. 100 tablet 0   pantoprazole (PROTONIX) 40 MG tablet TAKE 1 TABLET BY MOUTH EVERY DAY 90 tablet 0   potassium chloride SA (KLOR-CON) 20 MEQ tablet Take 1 tablet (20 mEq total) by mouth daily. 30 tablet 3   chlorthalidone (HYGROTON) 25 MG tablet TAKE 1 TABLET (25 MG TOTAL) BY MOUTH DAILY. 90 tablet 0   Ferrous Sulfate (IRON) 325 (65 Fe) MG TABS Take 1 tablet (325 mg total) by mouth daily. 30 tablet 2   No facility-administered medications prior to visit.    ROS Review of Systems  Constitutional:  Negative for chills, diaphoresis, fatigue and fever.  HENT: Negative.    Eyes: Negative.   Respiratory:  Negative for cough, chest tightness, shortness of breath and wheezing.   Cardiovascular:  Negative for chest pain, palpitations and leg swelling.  Gastrointestinal:  Negative for abdominal pain, blood in stool, constipation, diarrhea, nausea and vomiting.  Genitourinary: Negative.  Negative for difficulty urinating.  Musculoskeletal:  Positive for  arthralgias and back pain.  Skin: Negative.  Negative for pallor.  Neurological:  Negative for dizziness, weakness, light-headedness, numbness and headaches.  Hematological:  Negative for adenopathy. Does not bruise/bleed easily.  Psychiatric/Behavioral: Negative.     Objective:  BP 128/78 (BP Location: Left Arm, Patient Position: Sitting, Cuff Size: Large)    Pulse 82    Temp 97.9 F (36.6 C) (Oral)    Ht 5\' 9"  (1.753 m)    Wt 180 lb (81.6 kg)    SpO2 96%    BMI 26.58 kg/m   BP Readings from Last 3 Encounters:  06/27/21 128/78  06/11/21 123/75  03/23/21 (!) 144/86    Wt Readings from Last 3 Encounters:  06/27/21 180 lb (81.6 kg)  06/10/21 184 lb 11.9 oz (83.8 kg)  03/23/21 190 lb (86.2 kg)    Physical Exam Vitals reviewed.  Constitutional:      Appearance: He is not ill-appearing.  HENT:     Nose: Nose normal.     Mouth/Throat:     Mouth: Mucous membranes are moist.  Eyes:     General: No scleral icterus.    Conjunctiva/sclera: Conjunctivae normal.  Cardiovascular:     Rate and Rhythm: Normal rate and regular rhythm.     Heart sounds: No murmur heard. Pulmonary:     Effort: Pulmonary effort is normal.     Breath sounds: No stridor. No wheezing, rhonchi or rales.  Abdominal:     General: Abdomen is flat.     Palpations: There is no mass.  Tenderness: There is no abdominal tenderness. There is no guarding.     Hernia: No hernia is present.  Musculoskeletal:        General: Normal range of motion.     Cervical back: Neck supple.     Right lower leg: No edema.     Left lower leg: No edema.  Lymphadenopathy:     Cervical: No cervical adenopathy.  Skin:    General: Skin is warm.     Coloration: Skin is not pale.  Neurological:     General: No focal deficit present.     Mental Status: He is alert.  Psychiatric:        Mood and Affect: Mood normal.        Behavior: Behavior normal.    Lab Results  Component Value Date   WBC 11.7 (H) 06/27/2021   HGB 11.5  (L) 06/27/2021   HCT 35.5 (L) 06/27/2021   PLT 519.0 (H) 06/27/2021   GLUCOSE 90 06/11/2021   CHOL 162 10/22/2019   TRIG 301.0 (H) 10/22/2019   HDL 29.00 (L) 10/22/2019   LDLDIRECT 47.0 10/22/2019   LDLCALC 29 12/02/2018   ALT 10 06/11/2021   AST 15 06/11/2021   NA 135 06/11/2021   K 3.4 (L) 06/11/2021   CL 101 06/11/2021   CREATININE 1.13 06/11/2021   BUN 18 06/11/2021   CO2 26 06/11/2021   TSH 3.56 10/22/2019   PSA 0.87 03/18/2014   INR 1.1 01/20/2021   HGBA1C 6.2 03/23/2021    CT ABDOMEN PELVIS W CONTRAST  Result Date: 06/10/2021 CLINICAL DATA:  81 year old male with rectal bleeding and shortness of breath. Abdominal pain. EXAM: CT ABDOMEN AND PELVIS WITH CONTRAST TECHNIQUE: Multidetector CT imaging of the abdomen and pelvis was performed using the standard protocol following bolus administration of intravenous contrast. RADIATION DOSE REDUCTION: This exam was performed according to the departmental dose-optimization program which includes automated exposure control, adjustment of the mA and/or kV according to patient size and/or use of iterative reconstruction technique. CONTRAST:  148mL OMNIPAQUE IOHEXOL 300 MG/ML  SOLN COMPARISON:  CT Abdomen and Pelvis 01/20/2021 and earlier. FINDINGS: Lower chest: Mild chronic lung base atelectasis or scarring is stable. No pericardial or pleural effusion. Hepatobiliary: Stable. Chronically absent gallbladder. Pancreas: Negative. Spleen: Negative. Adrenals/Urinary Tract: Normal adrenal glands. Stable kidneys with chronic benign appearing renal cystic disease. No hydronephrosis or pararenal inflammation. Symmetric contrast excretion on the delayed images. Unremarkable bladder. No urinary calculus identified. Stomach/Bowel: Some fluid in the rectum. Severe diverticulosis throughout the sigmoid colon, and distal descending colon. Mild to moderate diverticulosis at the splenic flexure. Chronically indistinct sigmoid wall. No active mesenteric inflammation  identified. No obvious contrast extravasation, with some diverticula chronically hyperdense when compared to September. Mild diverticulosis at the hepatic flexure. Normal retrocecal appendix. No dilated small bowel. Negative terminal ileum. Decompressed stomach and duodenum. No free air, free fluid. Vascular/Lymphatic: Tortuous aortoiliac arteries. Aortoiliac calcified atherosclerosis. Major arterial structures are patent. No lymphadenopathy. Portal venous system is patent. Reproductive: Probable mild postoperative changes from previous bilateral inguinal hernia repair. Otherwise negative. Other: No pelvic free fluid. Musculoskeletal: Chronic severe lumbar spine degeneration. Subsequent moderate and severe lumbar spinal stenosis. No acute osseous abnormality identified. IMPRESSION: 1. Severe chronic diverticulosis of the large bowel from in the distal descending and sigmoid segments. Chronically indistinct sigmoid wall with no active diverticular inflammation identified. Still, consider a diverticular source of lower GI bleeding. 2. No other acute or inflammatory process identified in the abdomen or pelvis. Aortic Atherosclerosis (  ICD10-I70.0). Severe chronic lumbar spine degeneration. Electronically Signed   By: Genevie Ann M.D.   On: 06/10/2021 10:30   DG Chest Port 1 View  Result Date: 06/10/2021 CLINICAL DATA:  81 year old male with acute rectal bleeding. Shortness of breath. EXAM: PORTABLE CHEST 1 VIEW COMPARISON:  Portable chest 01/20/2021 and earlier. FINDINGS: Portable AP semi upright view at 0903 hours. Lower lung volumes. Mediastinal contours are stable with mild thoracic aortic tortuosity. Visualized tracheal air column is within normal limits. Mild crowding of lung markings at the bases. Otherwise when allowing for portable technique the lungs are clear. Right shoulder arthroplasty and left shoulder degeneration, postoperative changes to the acromion. No acute osseous abnormality identified. Negative  visible bowel gas. IMPRESSION: Lower lung volumes.  No acute cardiopulmonary abnormality. Electronically Signed   By: Genevie Ann M.D.   On: 06/10/2021 09:24   CT ANGIO GI BLEED  Result Date: 06/10/2021 CLINICAL DATA:  Evaluate for GI bleed. Rectal bleeding and shortness of breath. Abdominal pain. EXAM: CTA ABDOMEN AND PELVIS WITHOUT AND WITH CONTRAST TECHNIQUE: Multidetector CT imaging of the abdomen and pelvis was performed using the standard protocol during bolus administration of intravenous contrast. Multiplanar reconstructed images and MIPs were obtained and reviewed to evaluate the vascular anatomy. RADIATION DOSE REDUCTION: This exam was performed according to the departmental dose-optimization program which includes automated exposure control, adjustment of the mA and/or kV according to patient size and/or use of iterative reconstruction technique. CONTRAST:  43mL OMNIPAQUE IOHEXOL 350 MG/ML SOLN COMPARISON:  Earlier today. FINDINGS: VASCULAR Aorta: Aortic atherosclerosis. Normal caliber aorta without aneurysm, dissection, vasculitis or significant stenosis. Celiac: Patent without evidence of aneurysm, dissection, vasculitis or significant stenosis. SMA: Patent without evidence of aneurysm, dissection, vasculitis or significant stenosis. Renals: Both renal arteries are patent without evidence of aneurysm, dissection, vasculitis, fibromuscular dysplasia or significant stenosis. IMA: Patent without evidence of aneurysm, dissection, vasculitis or significant stenosis. Inflow: Patent without evidence of aneurysm, dissection, vasculitis or significant stenosis. Proximal Outflow: Bilateral common femoral and visualized portions of the superficial and profunda femoral arteries are patent without evidence of aneurysm, dissection, vasculitis or significant stenosis. Veins: No obvious venous abnormality within the limitations of this arterial phase study. Review of the MIP images confirms the above findings.  NON-VASCULAR Lower chest: No acute abnormality. Hepatobiliary: No focal liver abnormality. Previous cholecystectomy. Chronic increase caliber of the CBD is identified measuring up to 1.2 cm. Mild intrahepatic bile duct dilatation. Pancreas: Unremarkable. No pancreatic ductal dilatation or surrounding inflammatory changes. Spleen: Normal in size without focal abnormality. Adrenals/Urinary Tract: Normal adrenal glands. No nephrolithiasis or hydronephrosis. Bilateral kidney cysts are again identified. The largest kidney cyst arises off the anterior cortex of the interpolar left kidney measuring 6.1 cm. No hydronephrosis identified bilaterally. Urinary bladder is unremarkable. Stomach/Bowel: Stomach appears normal. No definite focal areas of intraluminal contrast extravasation to indicates the site of suspected GI bleed. There is extensive sigmoid diverticular disease without signs of acute sigmoid diverticulitis. The appendix is visualized and appears normal. Lymphatic: No significant vascular findings are present. No enlarged abdominal or pelvic lymph nodes. Reproductive: Prostate is unremarkable. Other: No free fluid or fluid collections Musculoskeletal: Lumbar degenerative disc disease. No acute or suspicious osseous findings. IMPRESSION: 1. No focal areas of intraluminal contrast extravasation/accumulation to indicate the site of suspected GI bleed. 2. Extensive sigmoid diverticular disease without signs of acute sigmoid diverticulitis. 3. Bilateral kidney cysts. 4. Chronic increase caliber of the CBD measuring up to 1.2 cm. This likely reflects post cholecystectomy physiology.  5. Aortic Atherosclerosis (ICD10-I70.0). Electronically Signed   By: Kerby Moors M.D.   On: 06/10/2021 14:42    Assessment & Plan:   Seibert was seen today for anemia.  Diagnoses and all orders for this visit:  Flu vaccine need -     Flu Vaccine QUAD High Dose(Fluad)  Benign essential HTN- His BP is well controlled. -      chlorthalidone (HYGROTON) 25 MG tablet; Take 1 tablet (25 mg total) by mouth daily.  CKD (chronic kidney disease), stage II  Vitamin B12 deficiency anemia due to intrinsic factor deficiency -     Folate; Future -     CBC with Differential/Platelet; Future -     cyanocobalamin ((VITAMIN B-12)) injection 1,000 mcg -     CBC with Differential/Platelet -     Folate  Other iron deficiency anemias- His H/H have improved. -     Ferrous Sulfate (IRON) 325 (65 Fe) MG TABS; Take 1 tablet (325 mg total) by mouth 2 (two) times daily.  Other orders -     Pneumococcal polysaccharide vaccine 23-valent greater than or equal to 2yo subcutaneous/IM   I have changed [employer]. I am also having him maintain his potassium chloride SA, pantoprazole, oxyCODONE-acetaminophen, and chlorthalidone. We administered cyanocobalamin.  Meds ordered this encounter  Medications   chlorthalidone (HYGROTON) 25 MG tablet    Sig: Take 1 tablet (25 mg total) by mouth daily.    Dispense:  90 tablet    Refill:  0   cyanocobalamin ((VITAMIN B-12)) injection 1,000 mcg   Ferrous Sulfate (IRON) 325 (65 Fe) MG TABS    Sig: Take 1 tablet (325 mg total) by mouth 2 (two) times daily.    Dispense:  180 tablet    Refill:  1     Follow-up: No follow-ups on file.  Scarlette Calico, MD

## 2021-06-28 ENCOUNTER — Encounter: Payer: Self-pay | Admitting: Internal Medicine

## 2021-06-28 ENCOUNTER — Telehealth: Payer: Self-pay

## 2021-06-28 MED ORDER — IRON 325 (65 FE) MG PO TABS: 325.0000 mg | ORAL_TABLET | Freq: Two times a day (BID) | ORAL | 1 refills | Status: DC

## 2021-06-28 NOTE — Telephone Encounter (Signed)
-----   Message from Marlon Pel, RN sent at 06/28/2021  8:58 AM EST -----  ----- Message ----- From: Marlon Pel, RN Sent: 06/28/2021  12:00 AM EST To: Marlon Pel, RN  Needs labs - see results 11/22 stark,

## 2021-06-28 NOTE — Telephone Encounter (Signed)
Called and reminded patient to come in for 3 month follow up lab work. He stated he had his CBC drawn at his PCP. See lab result note 06/27/21.

## 2021-06-28 NOTE — Patient Instructions (Signed)
Anemia °Anemia is a condition in which there is not enough red blood cells or hemoglobin in the blood. Hemoglobin is a substance in red blood cells that carries oxygen. °When you do not have enough red blood cells or hemoglobin (are anemic), your body cannot get enough oxygen and your organs may not work properly. As a result, you may feel very tired or have other problems. °What are the causes? °Common causes of anemia include: °Excessive bleeding. Anemia can be caused by excessive bleeding inside or outside the body, including bleeding from the intestines or from heavy menstrual periods in females. °Poor nutrition. °Long-lasting (chronic) kidney, thyroid, and liver disease. °Bone marrow disorders, spleen problems, and blood disorders. °Cancer and treatments for cancer. °HIV (human immunodeficiency virus) and AIDS (acquired immunodeficiency syndrome). °Infections, medicines, and autoimmune disorders that destroy red blood cells. °What are the signs or symptoms? °Symptoms of this condition include: °Minor weakness. °Dizziness. °Headache, or difficulties concentrating and sleeping. °Heartbeats that feel irregular or faster than normal (palpitations). °Shortness of breath, especially with exercise. °Pale skin, lips, and nails, or cold hands and feet. °Indigestion and nausea. °Symptoms may occur suddenly or develop slowly. If your anemia is mild, you may not have symptoms. °How is this diagnosed? °This condition is diagnosed based on blood tests, your medical history, and a physical exam. In some cases, a test may be needed in which cells are removed from the soft tissue inside of a bone and looked at under a microscope (bone marrow biopsy). Your health care provider may also check your stool (feces) for blood and may do additional testing to look for the cause of your bleeding. °Other tests may include: °Imaging tests, such as a CT scan or MRI. °A procedure to see inside your esophagus and stomach (endoscopy). °A  procedure to see inside your colon and rectum (colonoscopy). °How is this treated? °Treatment for this condition depends on the cause. If you continue to lose a lot of blood, you may need to be treated at a hospital. Treatment may include: °Taking supplements of iron, vitamin B12, or folic acid. °Taking a hormone medicine (erythropoietin) that can help to stimulate red blood cell growth. °Having a blood transfusion. This may be needed if you lose a lot of blood. °Making changes to your diet. °Having surgery to remove your spleen. °Follow these instructions at home: °Take over-the-counter and prescription medicines only as told by your health care provider. °Take supplements only as told by your health care provider. °Follow any diet instructions that you were given by your health care provider. °Keep all follow-up visits as told by your health care provider. This is important. °Contact a health care provider if: °You develop new bleeding anywhere in the body. °Get help right away if: °You are very weak. °You are short of breath. °You have pain in your abdomen or chest. °You are dizzy or feel faint. °You have trouble concentrating. °You have bloody stools, black stools, or tarry stools. °You vomit repeatedly or you vomit up blood. °These symptoms may represent a serious problem that is an emergency. Do not wait to see if the symptoms will go away. Get medical help right away. Call your local emergency services (911 in the [place] Do not drive yourself to the hospital. °Summary °Anemia is a condition in which you do not have enough red blood cells or enough of a substance in your red blood cells that carries oxygen (hemoglobin). °Symptoms may occur suddenly or develop slowly. °If your anemia is   mild, you may not have symptoms. °This condition is diagnosed with blood tests, a medical history, and a physical exam. Other tests may be needed. °Treatment for this condition depends on the cause of the anemia. °This  information is not intended to replace advice given to you by your health care provider. Make sure you discuss any questions you have with your health care provider. °Document Revised: 03/31/2019 Document Reviewed: 03/31/2019 °Elsevier Patient Education © 2022 [employer] ° °

## 2021-07-18 ENCOUNTER — Telehealth: Payer: Self-pay | Admitting: Internal Medicine

## 2021-07-18 NOTE — Telephone Encounter (Signed)
1.Medication Requested: oxyCODONE-acetaminophen (PERCOCET) 10-325 MG tablet ? ?2. Pharmacy (Name, Street, [place]): [id number]- [place], [place]? ?3. On Med List: Y ? ?4. Last Visit with PCP: 06-27-2021 ? ?5. Next visit date with PCP: N/A ? ? ?Last filled by Dr. BQuay Burow ?

## 2021-07-21 ENCOUNTER — Other Ambulatory Visit: Payer: Self-pay | Admitting: Internal Medicine

## 2021-07-21 DIAGNOSIS — M159 Polyosteoarthritis, unspecified: Secondary | ICD-10-CM

## 2021-07-21 DIAGNOSIS — M5416 Radiculopathy, lumbar region: Secondary | ICD-10-CM

## 2021-07-21 MED ORDER — OXYCODONE-ACETAMINOPHEN 10-325 MG PO TABS: 1.0000 | ORAL_TABLET | Freq: Four times a day (QID) | ORAL | 0 refills | Status: DC | PRN

## 2021-07-27 DIAGNOSIS — M47816 Spondylosis without myelopathy or radiculopathy, lumbar region: Secondary | ICD-10-CM | POA: Diagnosis not present

## 2021-08-17 ENCOUNTER — Telehealth: Payer: Self-pay

## 2021-08-17 NOTE — Telephone Encounter (Signed)
Pt is calling for a refill on: ?oxyCODONE-acetaminophen (PERCOCET) 10-325 MG tablet ?  ?Pharmacy: ?[id number]- [place], [place] - [address] ?  ?LOV 06/27/21 ?ROV 12/25/21 ?

## 2021-08-18 ENCOUNTER — Other Ambulatory Visit: Payer: Self-pay | Admitting: Internal Medicine

## 2021-08-18 DIAGNOSIS — M159 Polyosteoarthritis, unspecified: Secondary | ICD-10-CM

## 2021-08-18 DIAGNOSIS — M5416 Radiculopathy, lumbar region: Secondary | ICD-10-CM

## 2021-08-18 MED ORDER — OXYCODONE-ACETAMINOPHEN 10-325 MG PO TABS: 1.0000 | ORAL_TABLET | Freq: Four times a day (QID) | ORAL | 0 refills | Status: DC | PRN

## 2021-09-18 ENCOUNTER — Other Ambulatory Visit: Payer: Self-pay | Admitting: Internal Medicine

## 2021-09-18 ENCOUNTER — Telehealth: Payer: Self-pay

## 2021-09-18 DIAGNOSIS — M159 Polyosteoarthritis, unspecified: Secondary | ICD-10-CM

## 2021-09-18 DIAGNOSIS — M5416 Radiculopathy, lumbar region: Secondary | ICD-10-CM

## 2021-09-18 MED ORDER — OXYCODONE-ACETAMINOPHEN 10-325 MG PO TABS: 1.0000 | ORAL_TABLET | Freq: Four times a day (QID) | ORAL | 0 refills | Status: DC | PRN

## 2021-09-18 NOTE — Telephone Encounter (Signed)
Pt is requesting a refill on: ?oxyCODONE-acetaminophen (PERCOCET) 10-325 MG tablet ? ?Pharmacy: ?[id number]- [place], [place] - [address]? ?LOV 06/27/21 ?ROV 12/25/21 ?

## 2021-09-19 ENCOUNTER — Other Ambulatory Visit: Payer: Self-pay | Admitting: Gastroenterology

## 2021-09-19 DIAGNOSIS — K250 Acute gastric ulcer with hemorrhage: Secondary | ICD-10-CM

## 2021-09-20 ENCOUNTER — Emergency Department (HOSPITAL_COMMUNITY): Payer: Medicare Other

## 2021-09-20 ENCOUNTER — Inpatient Hospital Stay (HOSPITAL_COMMUNITY)
Admission: EM | Admit: 2021-09-20 | Discharge: 2021-09-23 | DRG: 280 | Disposition: A | Discharge status: Home Health | Payer: Medicare Other | Attending: Internal Medicine | Admitting: Internal Medicine

## 2021-09-20 ENCOUNTER — Encounter (HOSPITAL_COMMUNITY): Payer: Self-pay | Admitting: Internal Medicine

## 2021-09-20 DIAGNOSIS — I252 Old myocardial infarction: Secondary | ICD-10-CM | POA: Diagnosis not present

## 2021-09-20 DIAGNOSIS — I214 Non-ST elevation (NSTEMI) myocardial infarction: Principal | ICD-10-CM | POA: Diagnosis present

## 2021-09-20 DIAGNOSIS — E876 Hypokalemia: Secondary | ICD-10-CM | POA: Diagnosis not present

## 2021-09-20 DIAGNOSIS — M542 Cervicalgia: Secondary | ICD-10-CM | POA: Diagnosis present

## 2021-09-20 DIAGNOSIS — I110 Hypertensive heart disease with heart failure: Secondary | ICD-10-CM | POA: Diagnosis not present

## 2021-09-20 DIAGNOSIS — Z96611 Presence of right artificial shoulder joint: Secondary | ICD-10-CM | POA: Diagnosis present

## 2021-09-20 DIAGNOSIS — F17210 Nicotine dependence, cigarettes, uncomplicated: Secondary | ICD-10-CM | POA: Diagnosis present

## 2021-09-20 DIAGNOSIS — I5032 Chronic diastolic (congestive) heart failure: Secondary | ICD-10-CM | POA: Diagnosis not present

## 2021-09-20 DIAGNOSIS — I10 Essential (primary) hypertension: Secondary | ICD-10-CM | POA: Diagnosis not present

## 2021-09-20 DIAGNOSIS — D509 Iron deficiency anemia, unspecified: Secondary | ICD-10-CM | POA: Diagnosis present

## 2021-09-20 DIAGNOSIS — R6889 Other general symptoms and signs: Secondary | ICD-10-CM | POA: Diagnosis not present

## 2021-09-20 DIAGNOSIS — R072 Precordial pain: Principal | ICD-10-CM

## 2021-09-20 DIAGNOSIS — G9340 Encephalopathy, unspecified: Secondary | ICD-10-CM | POA: Diagnosis not present

## 2021-09-20 DIAGNOSIS — I5042 Chronic combined systolic (congestive) and diastolic (congestive) heart failure: Secondary | ICD-10-CM | POA: Diagnosis present

## 2021-09-20 DIAGNOSIS — D72829 Elevated white blood cell count, unspecified: Secondary | ICD-10-CM | POA: Diagnosis not present

## 2021-09-20 DIAGNOSIS — R531 Weakness: Secondary | ICD-10-CM | POA: Diagnosis present

## 2021-09-20 DIAGNOSIS — Z79899 Other long term (current) drug therapy: Secondary | ICD-10-CM | POA: Diagnosis not present

## 2021-09-20 DIAGNOSIS — Z20822 Contact with and (suspected) exposure to covid-19: Secondary | ICD-10-CM | POA: Diagnosis present

## 2021-09-20 DIAGNOSIS — G929 Unspecified toxic encephalopathy: Secondary | ICD-10-CM | POA: Diagnosis present

## 2021-09-20 DIAGNOSIS — R001 Bradycardia, unspecified: Secondary | ICD-10-CM | POA: Diagnosis not present

## 2021-09-20 DIAGNOSIS — Z743 Need for continuous supervision: Secondary | ICD-10-CM | POA: Diagnosis not present

## 2021-09-20 DIAGNOSIS — I255 Ischemic cardiomyopathy: Secondary | ICD-10-CM | POA: Diagnosis present

## 2021-09-20 DIAGNOSIS — R55 Syncope and collapse: Secondary | ICD-10-CM | POA: Diagnosis not present

## 2021-09-20 DIAGNOSIS — T450X5A Adverse effect of antiallergic and antiemetic drugs, initial encounter: Secondary | ICD-10-CM | POA: Diagnosis not present

## 2021-09-20 DIAGNOSIS — N281 Cyst of kidney, acquired: Secondary | ICD-10-CM | POA: Diagnosis not present

## 2021-09-20 DIAGNOSIS — Z9049 Acquired absence of other specified parts of digestive tract: Secondary | ICD-10-CM

## 2021-09-20 DIAGNOSIS — Z96653 Presence of artificial knee joint, bilateral: Secondary | ICD-10-CM | POA: Diagnosis not present

## 2021-09-20 DIAGNOSIS — Z886 Allergy status to analgesic agent status: Secondary | ICD-10-CM

## 2021-09-20 DIAGNOSIS — R0789 Other chest pain: Secondary | ICD-10-CM | POA: Diagnosis not present

## 2021-09-20 DIAGNOSIS — K5730 Diverticulosis of large intestine without perforation or abscess without bleeding: Secondary | ICD-10-CM | POA: Diagnosis not present

## 2021-09-20 DIAGNOSIS — Z8249 Family history of ischemic heart disease and other diseases of the circulatory system: Secondary | ICD-10-CM

## 2021-09-20 DIAGNOSIS — I472 Ventricular tachycardia, unspecified: Secondary | ICD-10-CM | POA: Diagnosis not present

## 2021-09-20 DIAGNOSIS — Z8711 Personal history of peptic ulcer disease: Secondary | ICD-10-CM

## 2021-09-20 DIAGNOSIS — I25110 Atherosclerotic heart disease of native coronary artery with unstable angina pectoris: Secondary | ICD-10-CM | POA: Diagnosis not present

## 2021-09-20 DIAGNOSIS — Y92009 Unspecified place in unspecified non-institutional (private) residence as the place of occurrence of the external cause: Secondary | ICD-10-CM | POA: Diagnosis not present

## 2021-09-20 DIAGNOSIS — R079 Chest pain, unspecified: Secondary | ICD-10-CM | POA: Diagnosis not present

## 2021-09-20 DIAGNOSIS — T40605A Adverse effect of unspecified narcotics, initial encounter: Secondary | ICD-10-CM | POA: Diagnosis present

## 2021-09-20 DIAGNOSIS — M47812 Spondylosis without myelopathy or radiculopathy, cervical region: Secondary | ICD-10-CM | POA: Diagnosis not present

## 2021-09-20 DIAGNOSIS — I4729 Other ventricular tachycardia: Secondary | ICD-10-CM | POA: Diagnosis not present

## 2021-09-20 LAB — TROPONIN I (HIGH SENSITIVITY)
Troponin I (High Sensitivity): 16 ng/L (ref <18)
Troponin I (High Sensitivity): 586 ng/L (ref <18)

## 2021-09-20 LAB — COMPREHENSIVE METABOLIC PANEL
ALT: 8 U/L (ref 0–44)
AST: 11 U/L — ABNORMAL LOW (ref 15–41)
Albumin: 2.5 g/dL — ABNORMAL LOW (ref 3.5–5.0)
Alkaline Phosphatase: 55 U/L (ref 38–126)
Anion gap: 6 (ref 5–15)
BUN: 12 mg/dL (ref 8–23)
CO2: 22 mmol/L (ref 22–32)
Calcium: 6.7 mg/dL — ABNORMAL LOW (ref 8.9–10.3)
Chloride: 113 mmol/L — ABNORMAL HIGH (ref 98–111)
Creatinine, Ser: 0.89 mg/dL (ref 0.61–1.24)
GFR, Estimated: >60 mL/min (ref >60)
Glucose, Bld: 108 mg/dL — ABNORMAL HIGH (ref 70–99)
Potassium: 2.3 mmol/L — CL (ref 3.5–5.1)
Sodium: 141 mmol/L (ref 135–145)
Total Bilirubin: 0.4 mg/dL (ref 0.3–1.2)
Total Protein: 5.4 g/dL — ABNORMAL LOW (ref 6.5–8.1)

## 2021-09-20 LAB — CBC WITH DIFFERENTIAL/PLATELET
Abs Immature Granulocytes: 0.07 10*3/uL (ref 0.00–0.07)
Basophils Absolute: 0 10*3/uL (ref 0.0–0.1)
Basophils Relative: 0 %
Eosinophils Absolute: 0.1 10*3/uL (ref 0.0–0.5)
Eosinophils Relative: 1 %
HCT: 31.6 % — ABNORMAL LOW (ref 39.0–52.0)
Hemoglobin: 10.6 g/dL — ABNORMAL LOW (ref 13.0–17.0)
Immature Granulocytes: 1 %
Lymphocytes Relative: 16 %
Lymphs Abs: 2.1 10*3/uL (ref 0.7–4.0)
MCH: 30.7 pg (ref 26.0–34.0)
MCHC: 33.5 g/dL (ref 30.0–36.0)
MCV: 91.6 fL (ref 80.0–100.0)
Monocytes Absolute: 1.2 10*3/uL — ABNORMAL HIGH (ref 0.1–1.0)
Monocytes Relative: 10 %
Neutro Abs: 9.1 10*3/uL — ABNORMAL HIGH (ref 1.7–7.7)
Neutrophils Relative %: 72 %
Platelets: 237 10*3/uL (ref 150–400)
RBC: 3.45 MIL/uL — ABNORMAL LOW (ref 4.22–5.81)
RDW: 18.2 % — ABNORMAL HIGH (ref 11.5–15.5)
WBC: 12.7 10*3/uL — ABNORMAL HIGH (ref 4.0–10.5)
nRBC: 0 % (ref 0.0–0.2)

## 2021-09-20 LAB — LIPID PANEL
Cholesterol: 110 mg/dL (ref 0–200)
HDL: 33 mg/dL — ABNORMAL LOW (ref >40)
LDL Cholesterol: 56 mg/dL (ref 0–99)
Total CHOL/HDL Ratio: 3.3 RATIO
Triglycerides: 106 mg/dL (ref <150)
VLDL: 21 mg/dL (ref 0–40)

## 2021-09-20 LAB — RESP PANEL BY RT-PCR (FLU A&B, COVID) ARPGX2
Influenza A by PCR: NEGATIVE
Influenza B by PCR: NEGATIVE
SARS Coronavirus 2 by RT PCR: NEGATIVE

## 2021-09-20 LAB — PROTIME-INR
INR: 1.2 (ref 0.8–1.2)
Prothrombin Time: 14.9 seconds (ref 11.4–15.2)

## 2021-09-20 LAB — HEMOGLOBIN A1C
Hgb A1c MFr Bld: 5.7 % — ABNORMAL HIGH (ref 4.8–5.6)
Mean Plasma Glucose: 116.89 mg/dL

## 2021-09-20 LAB — CBG MONITORING, ED: Glucose-Capillary: 110 mg/dL — ABNORMAL HIGH (ref 70–99)

## 2021-09-20 LAB — APTT: aPTT: 28 seconds (ref 24–36)

## 2021-09-20 MED ORDER — HEPARIN SODIUM (PORCINE) 5000 UNIT/ML IJ SOLN
60.0000 [IU]/kg | Freq: Once | INTRAMUSCULAR | Status: DC
Start: 2021-09-20 — End: 2021-09-20

## 2021-09-20 MED ORDER — NALOXONE HCL 0.4 MG/ML IJ SOLN
0.4000 mg | INTRAMUSCULAR | Status: DC | PRN
Start: 2021-09-20 — End: 2021-09-23

## 2021-09-20 MED ORDER — SODIUM CHLORIDE 0.9 % IV SOLN: INTRAVENOUS | Status: DC

## 2021-09-20 MED ORDER — IOHEXOL 350 MG/ML SOLN
100.0000 mL | Freq: Once | INTRAVENOUS | Status: AC | PRN
  Administered 2021-09-20: 100 mL via INTRAVENOUS

## 2021-09-20 MED ORDER — HEPARIN (PORCINE) 25000 UT/250ML-% IV SOLN
1200.0000 [IU]/h | INTRAVENOUS | Status: DC
Start: 2021-09-20 — End: 2021-09-21
  Administered 2021-09-20: 1000 [IU]/h via INTRAVENOUS
  Filled 2021-09-20: qty 250

## 2021-09-20 MED ORDER — HEPARIN (PORCINE) 25000 UT/250ML-% IV SOLN: 14.0000 [IU]/kg/h | INTRAVENOUS | Status: DC

## 2021-09-20 MED ORDER — ACETAMINOPHEN 650 MG RE SUPP: 650.0000 mg | Freq: Four times a day (QID) | RECTAL | Status: DC | PRN

## 2021-09-20 MED ORDER — ATORVASTATIN CALCIUM 40 MG PO TABS
40.0000 mg | ORAL_TABLET | Freq: Once | ORAL | Status: AC
Start: 2021-09-21 — End: 2021-09-21
  Administered 2021-09-21: 40 mg via ORAL
  Filled 2021-09-20: qty 1

## 2021-09-20 MED ORDER — ACETAMINOPHEN 325 MG PO TABS
650.0000 mg | ORAL_TABLET | Freq: Four times a day (QID) | ORAL | Status: DC | PRN
  Administered 2021-09-21: 650 mg via ORAL
  Filled 2021-09-20: qty 2

## 2021-09-20 MED ORDER — HEPARIN BOLUS VIA INFUSION
4000.0000 [IU] | Freq: Once | INTRAVENOUS | Status: AC
Start: 2021-09-20 — End: 2021-09-20
  Administered 2021-09-20: 4000 [IU] via INTRAVENOUS
  Filled 2021-09-20: qty 4000

## 2021-09-20 MED ORDER — POTASSIUM CHLORIDE 10 MEQ/100ML IV SOLN
10.0000 meq | INTRAVENOUS | Status: AC
  Administered 2021-09-20 (×2): 10 meq via INTRAVENOUS
  Filled 2021-09-20 (×2): qty 100

## 2021-09-20 MED ORDER — HEPARIN SODIUM (PORCINE) 5000 UNIT/ML IJ SOLN: 60.0000 [IU]/kg | Freq: Once | INTRAMUSCULAR | Status: DC

## 2021-09-20 MED ORDER — POTASSIUM CHLORIDE CRYS ER 20 MEQ PO TBCR
40.0000 meq | EXTENDED_RELEASE_TABLET | Freq: Once | ORAL | Status: AC
Start: 2021-09-20 — End: 2021-09-21
  Administered 2021-09-21: 40 meq via ORAL
  Filled 2021-09-20: qty 2

## 2021-09-20 MED ORDER — POTASSIUM CHLORIDE 10 MEQ/100ML IV SOLN
10.0000 meq | INTRAVENOUS | Status: AC
  Administered 2021-09-20 – 2021-09-21 (×2): 10 meq via INTRAVENOUS
  Filled 2021-09-20 (×2): qty 100

## 2021-09-20 NOTE — ED Triage Notes (Signed)
PER EMS: pt reports right sided chest pain that started at 1730 today with one brief episode of near-syncope with EMS when they tried to stand him up. No n/v. EMS adm 1L of NS, 16g LAC and one nitro SL.  ?BP- 134/78, HR- 58 sinus brady, CBG-129, 98% RA ?

## 2021-09-20 NOTE — ED Notes (Signed)
Posterior EKG done and given to Dr.Zackowski.  ?

## 2021-09-20 NOTE — ED Provider Notes (Addendum)
[facility] EMERGENCY DEPARTMENT Provider Note   CSN: [id number] Arrival date & time: 09/20/21  1807     History  Chief Complaint  Patient presents with   Chest Pain    Adrian Miller is a 81 y.o. male.  Patient states onset of right anterior chest pain an hour ago.  Has some degree of mental status change.  But will answer questions.  EMS was called out for chest pain.  Patient denies any nausea or vomiting.  Denies any radiation of the pain.  No swelling to the legs.      Home Medications Prior to Admission medications   Medication Sig Start Date End Date Taking? Authorizing Provider  chlorthalidone (HYGROTON) 25 MG tablet Take 1 tablet (25 mg total) by mouth daily. 06/27/21   Etta Grandchild, MD  Ferrous Sulfate (IRON) 325 (65 Fe) MG TABS Take 1 tablet (325 mg total) by mouth 2 (two) times daily. 06/28/21   Etta Grandchild, MD  oxyCODONE-acetaminophen (PERCOCET) 10-325 MG tablet Take 1 tablet by mouth every 6 (six) hours as needed for pain. 09/18/21   Etta Grandchild, MD  pantoprazole (PROTONIX) 40 MG tablet TAKE 1 TABLET BY MOUTH EVERY DAY 05/22/21   Meryl Dare, MD  potassium chloride SA (KLOR-CON) 20 MEQ tablet Take 1 tablet (20 mEq total) by mouth daily. 03/23/21   Georgina Quint, MD      Allergies    Asa [aspirin] and [name] [aspirin-salicylamide-caffeine]    Review of Systems   Review of Systems  Constitutional:  Negative for chills and fever.  HENT:  Negative for rhinorrhea and sore throat.   Eyes:  Negative for visual disturbance.  Respiratory:  Negative for cough and shortness of breath.   Cardiovascular:  Positive for chest pain. Negative for leg swelling.  Gastrointestinal:  Positive for abdominal distention. Negative for abdominal pain, diarrhea, nausea and vomiting.  Genitourinary:  Negative for dysuria.  Musculoskeletal:  Negative for back pain and neck pain.  Skin:  Negative for rash.  Neurological:  Negative for  dizziness, light-headedness and headaches.  Hematological:  Does not bruise/bleed easily.  Psychiatric/Behavioral:  Negative for confusion.    Physical Exam Updated Vital Signs BP 138/82   Pulse 60   Temp (!) 97.5 F (36.4 C) (Oral)   Resp 20   SpO2 97%  Physical Exam Vitals and nursing note reviewed.  Constitutional:      General: He is not in acute distress.    Appearance: He is well-developed.  HENT:     Head: Normocephalic and atraumatic.  Eyes:     Conjunctiva/sclera: Conjunctivae normal.  Cardiovascular:     Rate and Rhythm: Normal rate and regular rhythm.     Heart sounds: No murmur heard. Pulmonary:     Effort: Pulmonary effort is normal. No respiratory distress.     Breath sounds: Normal breath sounds. No decreased breath sounds, wheezing, rhonchi or rales.  Abdominal:     Palpations: Abdomen is soft.     Tenderness: There is no abdominal tenderness.  Musculoskeletal:        General: No swelling.     Cervical back: Neck supple.     Right lower leg: No edema.     Left lower leg: No edema.  Skin:    General: Skin is warm and dry.     Capillary Refill: Capillary refill takes less than 2 seconds.  Neurological:     Mental Status: He is  alert.     Comments: Patient awake and will answer questions.  But acting somnolent.  Patient moving all 4 extremities.  Psychiatric:        Mood and Affect: Mood normal.    ED Results / Procedures / Treatments   Labs (all labs ordered are listed, but only abnormal results are displayed) Labs Reviewed  HEMOGLOBIN A1C - Abnormal; Notable for the following components:      Result Value   Hgb A1c MFr Bld 5.7 (*)    All other components within normal limits  CBC WITH DIFFERENTIAL/PLATELET - Abnormal; Notable for the following components:   WBC 12.7 (*)    RBC 3.45 (*)    Hemoglobin 10.6 (*)    HCT 31.6 (*)    RDW 18.2 (*)    Neutro Abs 9.1 (*)    Monocytes Absolute 1.2 (*)    All other components within normal limits   COMPREHENSIVE METABOLIC PANEL - Abnormal; Notable for the following components:   Potassium 2.3 (*)    Chloride 113 (*)    Glucose, Bld 108 (*)    Calcium 6.7 (*)    Total Protein 5.4 (*)    Albumin 2.5 (*)    AST 11 (*)    All other components within normal limits  LIPID PANEL - Abnormal; Notable for the following components:   HDL 33 (*)    All other components within normal limits  CBG MONITORING, ED - Abnormal; Notable for the following components:   Glucose-Capillary 110 (*)    All other components within normal limits  RESP PANEL BY RT-PCR (FLU A&B, COVID) ARPGX2  PROTIME-INR  APTT  ETHANOL  TROPONIN I (HIGH SENSITIVITY)  TROPONIN I (HIGH SENSITIVITY)    EKG EKG Interpretation  Date/Time:  Wednesday Sep 20 2021 18:10:37 EDT Ventricular Rate:  58 PR Interval:  190 QRS Duration: 84 QT Interval:  439 QTC Calculation: 432 R Axis:   70 Text Interpretation: Sinus rhythm Posterior infarct, acute (LCx) Anterior infarct, old >>> Acute MI <<< Confirmed by Vanetta Mulders [phone]) on 09/20/2021 6:13:23 PM  Radiology CT Head Wo Contrast  Result Date: 09/20/2021 CLINICAL DATA:  Near syncope, altered mental status EXAM: CT HEAD WITHOUT CONTRAST CT CERVICAL SPINE WITHOUT CONTRAST TECHNIQUE: Multidetector CT imaging of the head and cervical spine was performed following the standard protocol without intravenous contrast. Multiplanar CT image reconstructions of the cervical spine were also generated. RADIATION DOSE REDUCTION: This exam was performed according to the departmental dose-optimization program which includes automated exposure control, adjustment of the mA and/or kV according to patient size and/or use of iterative reconstruction technique. COMPARISON:  01/20/2021 FINDINGS: CT HEAD FINDINGS Brain: No evidence of acute infarction, hemorrhage, hydrocephalus, extra-axial collection or mass lesion/mass effect. Subcortical white matter and periventricular small vessel ischemic  changes. Mild chronic left hemispheric atrophy with prominent extra-axial CSF. Vascular: Mild intracranial atherosclerosis. Skull: Normal. Negative for fracture or focal lesion. Sinuses/Orbits: The visualized paranasal sinuses are essentially clear. The mastoid air cells are unopacified. Other: None. CT CERVICAL SPINE FINDINGS Alignment: Reversal of the normal cervical lordosis. Skull base and vertebrae: No acute fracture. No primary bone lesion or focal pathologic process. Soft tissues and spinal canal: No prevertebral fluid or swelling. No visible canal hematoma. Disc levels: Mild to moderate degenerative changes of the mid cervical spine. Spinal canal is patent. Upper chest: Evaluated on dedicated CTA chest. Other: None. IMPRESSION: No evidence of acute intracranial abnormality. Atrophy with small vessel ischemic changes. No evidence of traumatic  injury to the cervical spine. Mild to moderate degenerative changes. Electronically Signed   By: Charline Bills M.D.   On: 09/20/2021 21:27   CT Cervical Spine Wo Contrast  Result Date: 09/20/2021 CLINICAL DATA:  Near syncope, altered mental status EXAM: CT HEAD WITHOUT CONTRAST CT CERVICAL SPINE WITHOUT CONTRAST TECHNIQUE: Multidetector CT imaging of the head and cervical spine was performed following the standard protocol without intravenous contrast. Multiplanar CT image reconstructions of the cervical spine were also generated. RADIATION DOSE REDUCTION: This exam was performed according to the departmental dose-optimization program which includes automated exposure control, adjustment of the mA and/or kV according to patient size and/or use of iterative reconstruction technique. COMPARISON:  01/20/2021 FINDINGS: CT HEAD FINDINGS Brain: No evidence of acute infarction, hemorrhage, hydrocephalus, extra-axial collection or mass lesion/mass effect. Subcortical white matter and periventricular small vessel ischemic changes. Mild chronic left hemispheric atrophy  with prominent extra-axial CSF. Vascular: Mild intracranial atherosclerosis. Skull: Normal. Negative for fracture or focal lesion. Sinuses/Orbits: The visualized paranasal sinuses are essentially clear. The mastoid air cells are unopacified. Other: None. CT CERVICAL SPINE FINDINGS Alignment: Reversal of the normal cervical lordosis. Skull base and vertebrae: No acute fracture. No primary bone lesion or focal pathologic process. Soft tissues and spinal canal: No prevertebral fluid or swelling. No visible canal hematoma. Disc levels: Mild to moderate degenerative changes of the mid cervical spine. Spinal canal is patent. Upper chest: Evaluated on dedicated CTA chest. Other: None. IMPRESSION: No evidence of acute intracranial abnormality. Atrophy with small vessel ischemic changes. No evidence of traumatic injury to the cervical spine. Mild to moderate degenerative changes. Electronically Signed   By: Charline Bills M.D.   On: 09/20/2021 21:27   DG Chest Port 1 View  Result Date: 09/20/2021 CLINICAL DATA:  Chest pain. EXAM: PORTABLE CHEST 1 VIEW COMPARISON:  June 10, 2021. FINDINGS: The heart size and mediastinal contours are within normal limits. Both lungs are clear. Status post right shoulder arthroplasty. IMPRESSION: No active disease. Electronically Signed   By: Lupita Raider M.D.   On: 09/20/2021 18:45   CT Angio Chest/Abd/Pel for Dissection W and/or W/WO  Result Date: 09/20/2021 CLINICAL DATA:  Right chest pain, evaluate for dissection EXAM: CT ANGIOGRAPHY CHEST, ABDOMEN AND PELVIS TECHNIQUE: Non-contrast CT of the chest was initially obtained. Multidetector CT imaging through the chest, abdomen and pelvis was performed using the standard protocol during bolus administration of intravenous contrast. Multiplanar reconstructed images and MIPs were obtained and reviewed to evaluate the vascular anatomy. RADIATION DOSE REDUCTION: This exam was performed according to the departmental dose-optimization  program which includes automated exposure control, adjustment of the mA and/or kV according to patient size and/or use of iterative reconstruction technique. CONTRAST:  OMNIPAQUE IOHEXOL 350 MG/ML SOLN COMPARISON:  CT abdomen/pelvis dated 06/10/2021. CT chest dated 01/20/2021. FINDINGS: CTA CHEST FINDINGS Cardiovascular: On unenhanced CT, there is no evidence of intramural hematoma. Following contrast administration, there is preferential opacification of the thoracic aorta. No evidence of thoracic aortic aneurysm or dissection. Atherosclerotic calcifications of the aortic arch. Study is not tailored for evaluation of the pulmonary arteries. No evidence of central pulmonary embolism. Heart is normal in size. No pericardial effusion. Mild coronary atherosclerosis of the LAD. Mediastinum/Nodes: No suspicious mediastinal lymphadenopathy. Visualized thyroid is unremarkable. Lungs/Pleura: Moderate centrilobular and paraseptal emphysematous changes, upper lung predominant. No suspicious pulmonary nodules. Minimal bibasilar atelectasis. No focal consolidation. No pleural effusion or pneumothorax. Musculoskeletal: Right shoulder arthroplasty. Degenerative changes of the thoracic spine. No fracture is  seen. Review of the MIP images confirms the above findings. CTA ABDOMEN AND PELVIS FINDINGS VASCULAR Aorta: No evidence abdominal aortic aneurysm or dissection. Atherosclerotic calcifications. Patent. Celiac: Patent. SMA: Patent. Atherosclerotic calcifications. Renals: Patent bilaterally. Atherosclerotic calcifications at the origin of the left renal artery. IMA: Patent. Inflow: Patent bilaterally. Atherosclerotic calcifications. Veins: Unremarkable. Review of the MIP images confirms the above findings. NON-VASCULAR Hepatobiliary: Liver is within normal limits. Status post cholecystectomy. No intrahepatic or extrahepatic ductal dilatation. Pancreas: Within normal limits. Spleen: Within normal limits. Adrenals/Urinary  Tract: Adrenal glands are within normal limits. Bilateral renal cysts, measuring up to 6.1 cm in the anterior left lower kidney (series 7/image 26). No hydronephrosis. Bladder is within normal limits. Stomach/Bowel: Stomach is within normal limits. No evidence of bowel obstruction. Normal appendix (series 7/image 198). Extensive sigmoid diverticulosis, without evidence of diverticulitis. Lymphatic: No suspicious abdominopelvic lymphadenopathy. Reproductive: Mild prostatomegaly. Other: No abdominopelvic ascites. Musculoskeletal: Degenerative changes of the lumbar spine. Review of the MIP images confirms the above findings. IMPRESSION: No evidence of thoracoabdominal aortic aneurysm or dissection. No evidence of acute cardiopulmonary disease. Extensive left colonic diverticulosis, without evidence of diverticulitis. Additional ancillary findings as above. Electronically Signed   By: Charline Bills M.D.   On: 09/20/2021 21:37    Procedures Procedures    Medications Ordered in ED Medications  0.9 %  sodium chloride infusion ( Intravenous New Bag/Given 09/20/21 1912)  potassium chloride 10 mEq in 100 mL IVPB (10 mEq Intravenous New Bag/Given 09/20/21 2219)  iohexol (OMNIPAQUE) 350 MG/ML injection 100 mL (100 mLs Intravenous Contrast Given 09/20/21 2115)    ED Course/ Medical Decision Making/ A&P                           Medical Decision Making Amount and/or Complexity of Data Reviewed Labs: ordered. Radiology: ordered.  Risk Prescription drug management. Decision regarding hospitalization.   CRITICAL CARE Performed by: Vanetta Mulders Total critical care time: 45 minutes Critical care time was exclusive of separately billable procedures and treating other patients. Critical care was necessary to treat or prevent imminent or life-threatening deterioration. Critical care was time spent personally by me on the following activities: development of treatment plan with patient and/or surrogate  as well as nursing, discussions with consultants, evaluation of patient's response to treatment, examination of patient, obtaining history from patient or surrogate, ordering and performing treatments and interventions, ordering and review of laboratory studies, ordering and review of radiographic studies, pulse oximetry and re-evaluation of patient's condition.  EKG consistent with posterior infarct acute.  Have activated code STEMI and order set.  Still have some concerns about his mental status.  But his vital signs are very normal.  We will discuss with cardiology the EKG findings.  Chart review shows no history of being followed by cardiology.  In discussion with cardiology no ST segment elevation they are requesting a posterior EKG  Hold on going to the Cath Lab at the moment.  Because of the altered mental status.  We will plan to get CT head CT angio chest and abdomen to rule out dissection.  Patient's history a little confusing times he denies any chest pain.  He denies making the call for the chest pain.  But other times he points to his right chest.  And told me that had been there for an hour.  Within other times he points to his neck hurting and his abdomen hurting.  Patient's initial troponin 16.  Patient's  potassium significantly low at 2.3.  Patient will receive 2 runs of IV potassium.  Patient will be going to CT scan for CT neck chest abdomen and pelvis to rule out any kind of dissection.  Delta troponin pending.  COVID influenza negative.  CT head without any acute findings.  CT cervical spine without any traumatic injury or any significant changes other than moderate degenerative changes.  CT a chest abdomen and pelvis with and without without any acute findings.  Extensive left colonic diverticulosis without evidence of diverticulitis.  We will discuss with hospitalist for admission.  Did get additional information that the patient did take some oxycodone.  May have explained  his altered mental status when he first arrived.  He is now much more awake.  Seems to be much more coherent.  Family states he is back to baseline.  Patient's second troponin jumped up significantly at 586.  Consistent with a non-STEMI.  Will give heparin bolus and heparin infusion.  Discussed with hospitalist who will admit.  Have repaged cardiology to bring them up-to-date on the findings.  Recontacted cardiology Dr. Priscella Mann he will see the patient in the morning.  Final Clinical Impression(s) / ED Diagnoses Final diagnoses:  Precordial pain  Hypokalemia    Rx / DC Orders ED Discharge Orders     None         Vanetta Mulders, MD 09/20/21 1827    Vanetta Mulders, MD 09/20/21 2004    Vanetta Mulders, MD 09/20/21 2230    Vanetta Mulders, MD 09/20/21 [id number]    Vanetta Mulders, MD 09/20/21 3295    Vanetta Mulders, MD 09/20/21 2314

## 2021-09-20 NOTE — ED Notes (Signed)
Patient transported to CT 

## 2021-09-20 NOTE — Progress Notes (Addendum)
ANTICOAGULATION CONSULT NOTE - Initial Consult ? ?Pharmacy Consult for Heparin ?Indication: chest pain/ACS ? ?Allergies  ?Allergen Reactions  ? Asa [Aspirin] Other (See Comments)  ?  Angioedema- patient is unaware of this (??)  ? [name] [Aspirin-Salicylamide-Caffeine] Other (See Comments)  ?  bleeding  ? ? ?Patient Measurements: ?Height: '5\' 9"'$  (175.3 cm) ?IBW/kg (Calculated) : 70.7 ?Heparin Dosing Weight: 81.8 kg ? ?Vital Signs: ?Temp: 97.5 ?F (36.4 ?C) (05/17 1819) ?Temp Source: Oral (05/17 1819) ?BP: 142/83 (05/17 2230) ?Pulse Rate: 59 (05/17 2230) ? ?Labs: ?Recent Labs  ?  09/20/21 ?1825 09/20/21 ?2143  ?HGB 10.6*  --   ?HCT 31.6*  --   ?PLT 237  --   ?APTT 28  --   ?LABPROT 14.9  --   ?INR 1.2  --   ?CREATININE 0.89  --   ?TROPONINIHS 16 586*  ? ? ?CrCl cannot be calculated (Unknown ideal weight.). ? ? ?Medical History: ?Past Medical History:  ?Diagnosis Date  ? Acute esophagitis 06/01/2015  ? Arthritis   ? Benign essential HTN 05/31/2015  ? Duodenal ulcer hemorrhage   ? Esophageal stricture 06/01/2015  ? Gastric ulcer   ? Hiatal hernia 06/01/2015  ? ? ?Medications:  ?(Not in a hospital admission) ? ?Scheduled:  ? heparin  4,000 Units Intravenous Once  ? ?Infusions:  ? sodium chloride 20 mL/hr at 09/20/21 1912  ? heparin    ? potassium chloride 10 mEq (09/20/21 2219)  ? ?PRN: acetaminophen **OR** acetaminophen ? ?Assessment: ?39 yom presenting with chest pain. Heparin per pharmacy consult placed for chest pain/ACS. ? ?Patient is not on anticoagulation prior to arrival. ? ?Hgb 10.6; plt 237 ?hsTrop 586 ?PT/INR 14.9/1.2 ? ?Goal of Therapy:  ?Heparin level 0.3-0.7 units/ml ?Monitor platelets by anticoagulation protocol: Yes ?  ?Plan:  ?Give 4000 units bolus x 1 ?Start heparin infusion at 1000 units/hr ?Check anti-Xa level at 0600 and daily while on heparin ?Continue to monitor H&H and platelets ? ?Lorelei Pont, PharmD, BCPS ?09/20/2021 10:56 PM ?ED Clinical Pharmacist -  [phone] ? ? ? ?

## 2021-09-20 NOTE — ED Notes (Signed)
EMS reports pt called 911 for right sided chest pain. Pt refuses to answer questions upon arrival to ED. Dr.Zackowski called STEMI at this time. Pt placed on zoll pads. Labs drawn. Changed into gown. Pt continues to be uncooperative at this time. Changes to EKG since Feb 2023. Cardiac monitoring in place. Will continue to monitor.  ?

## 2021-09-21 ENCOUNTER — Inpatient Hospital Stay (HOSPITAL_COMMUNITY): Payer: Medicare Other

## 2021-09-21 ENCOUNTER — Inpatient Hospital Stay (HOSPITAL_COMMUNITY)
Admission: EM | Disposition: A | Discharge status: Home Health | Payer: Self-pay | Source: Home / Self Care | Attending: Internal Medicine

## 2021-09-21 DIAGNOSIS — I5032 Chronic diastolic (congestive) heart failure: Secondary | ICD-10-CM | POA: Diagnosis present

## 2021-09-21 DIAGNOSIS — I10 Essential (primary) hypertension: Secondary | ICD-10-CM | POA: Diagnosis not present

## 2021-09-21 DIAGNOSIS — G9340 Encephalopathy, unspecified: Secondary | ICD-10-CM | POA: Diagnosis not present

## 2021-09-21 DIAGNOSIS — D509 Iron deficiency anemia, unspecified: Secondary | ICD-10-CM | POA: Diagnosis present

## 2021-09-21 DIAGNOSIS — I214 Non-ST elevation (NSTEMI) myocardial infarction: Secondary | ICD-10-CM | POA: Diagnosis not present

## 2021-09-21 HISTORY — PX: LEFT HEART CATH AND CORONARY ANGIOGRAPHY: CATH118249

## 2021-09-21 LAB — URINALYSIS, COMPLETE (UACMP) WITH MICROSCOPIC
Bacteria, UA: NONE SEEN
Bilirubin Urine: NEGATIVE
Glucose, UA: NEGATIVE mg/dL
Hgb urine dipstick: NEGATIVE
Ketones, ur: NEGATIVE mg/dL
Leukocytes,Ua: NEGATIVE
Nitrite: NEGATIVE
Protein, ur: NEGATIVE mg/dL
Specific Gravity, Urine: 1.035 — ABNORMAL HIGH (ref 1.005–1.030)
pH: 6 (ref 5.0–8.0)

## 2021-09-21 LAB — CBC WITH DIFFERENTIAL/PLATELET
Abs Immature Granulocytes: 0.04 10*3/uL (ref 0.00–0.07)
Basophils Absolute: 0 10*3/uL (ref 0.0–0.1)
Basophils Relative: 0 %
Eosinophils Absolute: 0 10*3/uL (ref 0.0–0.5)
Eosinophils Relative: 0 %
HCT: 41.6 % (ref 39.0–52.0)
Hemoglobin: 13.6 g/dL (ref 13.0–17.0)
Immature Granulocytes: 0 %
Lymphocytes Relative: 12 %
Lymphs Abs: 1.6 10*3/uL (ref 0.7–4.0)
MCH: 29.7 pg (ref 26.0–34.0)
MCHC: 32.7 g/dL (ref 30.0–36.0)
MCV: 90.8 fL (ref 80.0–100.0)
Monocytes Absolute: 1 10*3/uL (ref 0.1–1.0)
Monocytes Relative: 8 %
Neutro Abs: 10.4 10*3/uL — ABNORMAL HIGH (ref 1.7–7.7)
Neutrophils Relative %: 80 %
Platelets: 279 10*3/uL (ref 150–400)
RBC: 4.58 MIL/uL (ref 4.22–5.81)
RDW: 18 % — ABNORMAL HIGH (ref 11.5–15.5)
WBC: 13.1 10*3/uL — ABNORMAL HIGH (ref 4.0–10.5)
nRBC: 0 % (ref 0.0–0.2)

## 2021-09-21 LAB — COMPREHENSIVE METABOLIC PANEL
ALT: 17 U/L (ref 0–44)
AST: 93 U/L — ABNORMAL HIGH (ref 15–41)
Albumin: 3.4 g/dL — ABNORMAL LOW (ref 3.5–5.0)
Alkaline Phosphatase: 76 U/L (ref 38–126)
Anion gap: 10 (ref 5–15)
BUN: 9 mg/dL (ref 8–23)
CO2: 24 mmol/L (ref 22–32)
Calcium: 9.1 mg/dL (ref 8.9–10.3)
Chloride: 102 mmol/L (ref 98–111)
Creatinine, Ser: 0.92 mg/dL (ref 0.61–1.24)
GFR, Estimated: >60 mL/min (ref >60)
Glucose, Bld: 126 mg/dL — ABNORMAL HIGH (ref 70–99)
Potassium: 3.7 mmol/L (ref 3.5–5.1)
Sodium: 136 mmol/L (ref 135–145)
Total Bilirubin: 0.7 mg/dL (ref 0.3–1.2)
Total Protein: 7.6 g/dL (ref 6.5–8.1)

## 2021-09-21 LAB — HEPARIN LEVEL (UNFRACTIONATED): Heparin Unfractionated: <0.1 IU/mL — ABNORMAL LOW (ref 0.30–0.70)

## 2021-09-21 LAB — I-STAT VENOUS BLOOD GAS, ED
Acid-Base Excess: 2 mmol/L (ref 0.0–2.0)
Bicarbonate: 27.2 mmol/L (ref 20.0–28.0)
Calcium, Ion: 1.11 mmol/L — ABNORMAL LOW (ref 1.15–1.40)
HCT: 43 % (ref 39.0–52.0)
Hemoglobin: 14.6 g/dL (ref 13.0–17.0)
O2 Saturation: 72 %
Potassium: 4 mmol/L (ref 3.5–5.1)
Sodium: 136 mmol/L (ref 135–145)
TCO2: 28 mmol/L (ref 22–32)
pCO2, Ven: 42 mmHg — ABNORMAL LOW (ref 44–60)
pH, Ven: 7.419 (ref 7.25–7.43)
pO2, Ven: 38 mmHg (ref 32–45)

## 2021-09-21 LAB — RAPID URINE DRUG SCREEN, HOSP PERFORMED
Amphetamines: NOT DETECTED
Barbiturates: NOT DETECTED
Benzodiazepines: NOT DETECTED
Cocaine: NOT DETECTED
Opiates: NOT DETECTED
Tetrahydrocannabinol: NOT DETECTED

## 2021-09-21 LAB — MAGNESIUM
Magnesium: 1.7 mg/dL (ref 1.7–2.4)
Magnesium: 1.8 mg/dL (ref 1.7–2.4)

## 2021-09-21 LAB — TSH: TSH: 0.486 u[IU]/mL (ref 0.350–4.500)

## 2021-09-21 LAB — BRAIN NATRIURETIC PEPTIDE: B Natriuretic Peptide: 69.5 pg/mL (ref 0.0–100.0)

## 2021-09-21 LAB — ETHANOL: Alcohol, Ethyl (B): <10 mg/dL (ref <10)

## 2021-09-21 LAB — TROPONIN I (HIGH SENSITIVITY): Troponin I (High Sensitivity): 16231 ng/L (ref <18)

## 2021-09-21 SURGERY — LEFT HEART CATH AND CORONARY ANGIOGRAPHY
Anesthesia: LOCAL

## 2021-09-21 MED ORDER — ATORVASTATIN CALCIUM 40 MG PO TABS
40.0000 mg | ORAL_TABLET | Freq: Every day | ORAL | Status: DC
Start: 2021-09-21 — End: 2021-09-23
  Administered 2021-09-21 – 2021-09-23 (×3): 40 mg via ORAL
  Filled 2021-09-21 (×4): qty 1

## 2021-09-21 MED ORDER — HEPARIN SODIUM (PORCINE) 1000 UNIT/ML IJ SOLN
INTRAMUSCULAR | Status: AC
  Filled 2021-09-21: qty 10

## 2021-09-21 MED ORDER — ONDANSETRON HCL 4 MG/2ML IJ SOLN: 4.0000 mg | Freq: Four times a day (QID) | INTRAMUSCULAR | Status: DC | PRN

## 2021-09-21 MED ORDER — MIDAZOLAM HCL 2 MG/2ML IJ SOLN
INTRAMUSCULAR | Status: DC | PRN
  Administered 2021-09-21: 1 mg via INTRAVENOUS

## 2021-09-21 MED ORDER — LABETALOL HCL 5 MG/ML IV SOLN: 10.0000 mg | INTRAVENOUS | Status: AC | PRN

## 2021-09-21 MED ORDER — PERFLUTREN LIPID MICROSPHERE
1.0000 mL | INTRAVENOUS | Status: AC | PRN
  Administered 2021-09-21: 2 mL via INTRAVENOUS

## 2021-09-21 MED ORDER — SODIUM CHLORIDE 0.9% FLUSH: 3.0000 mL | Freq: Two times a day (BID) | INTRAVENOUS | Status: DC

## 2021-09-21 MED ORDER — ASPIRIN 325 MG PO TABS
ORAL_TABLET | ORAL | Status: AC
Start: 2021-09-21 — End: ?
  Filled 2021-09-21: qty 1

## 2021-09-21 MED ORDER — HEPARIN (PORCINE) IN NACL 1000-0.9 UT/500ML-% IV SOLN
INTRAVENOUS | Status: AC
  Filled 2021-09-21: qty 1000

## 2021-09-21 MED ORDER — CLOPIDOGREL BISULFATE 75 MG PO TABS
75.0000 mg | ORAL_TABLET | Freq: Every day | ORAL | Status: DC
  Administered 2021-09-22 – 2021-09-23 (×2): 75 mg via ORAL
  Filled 2021-09-21 (×2): qty 1

## 2021-09-21 MED ORDER — SODIUM CHLORIDE 0.9 % IV SOLN: 250.0000 mL | INTRAVENOUS | Status: DC | PRN

## 2021-09-21 MED ORDER — HEPARIN SODIUM (PORCINE) 1000 UNIT/ML IJ SOLN
INTRAMUSCULAR | Status: DC | PRN
  Administered 2021-09-21 (×2): 5000 [IU] via INTRAVENOUS

## 2021-09-21 MED ORDER — FENTANYL CITRATE (PF) 100 MCG/2ML IJ SOLN
INTRAMUSCULAR | Status: DC | PRN
  Administered 2021-09-21: 50 ug via INTRAVENOUS

## 2021-09-21 MED ORDER — HEPARIN SODIUM (PORCINE) 5000 UNIT/ML IJ SOLN
5000.0000 [IU] | Freq: Three times a day (TID) | INTRAMUSCULAR | Status: DC
  Administered 2021-09-22 – 2021-09-23 (×4): 5000 [IU] via SUBCUTANEOUS
  Filled 2021-09-21 (×4): qty 1

## 2021-09-21 MED ORDER — HEPARIN (PORCINE) IN NACL 1000-0.9 UT/500ML-% IV SOLN
INTRAVENOUS | Status: DC | PRN
  Administered 2021-09-21 (×2): 500 mL

## 2021-09-21 MED ORDER — NICOTINE 14 MG/24HR TD PT24
14.0000 mg | MEDICATED_PATCH | Freq: Every day | TRANSDERMAL | Status: DC | PRN
Start: 2021-09-21 — End: 2021-09-23

## 2021-09-21 MED ORDER — ASPIRIN 325 MG PO TABS
ORAL_TABLET | ORAL | Status: DC | PRN
  Administered 2021-09-21: 325 mg via ORAL

## 2021-09-21 MED ORDER — ACETAMINOPHEN 325 MG PO TABS
650.0000 mg | ORAL_TABLET | ORAL | Status: DC | PRN
  Administered 2021-09-21 – 2021-09-22 (×2): 650 mg via ORAL
  Filled 2021-09-21 (×2): qty 2

## 2021-09-21 MED ORDER — VERAPAMIL HCL 2.5 MG/ML IV SOLN
INTRAVENOUS | Status: DC | PRN
  Administered 2021-09-21: 10 mL via INTRA_ARTERIAL

## 2021-09-21 MED ORDER — SODIUM CHLORIDE 0.9% FLUSH
3.0000 mL | Freq: Two times a day (BID) | INTRAVENOUS | Status: DC
  Administered 2021-09-21 – 2021-09-22 (×2): 3 mL via INTRAVENOUS

## 2021-09-21 MED ORDER — LIDOCAINE HCL (PF) 1 % IJ SOLN
INTRAMUSCULAR | Status: DC | PRN
  Administered 2021-09-21: 2 mL

## 2021-09-21 MED ORDER — HEPARIN BOLUS VIA INFUSION
2000.0000 [IU] | Freq: Once | INTRAVENOUS | Status: AC
Start: 2021-09-21 — End: 2021-09-21
  Administered 2021-09-21: 2000 [IU] via INTRAVENOUS
  Filled 2021-09-21: qty 2000

## 2021-09-21 MED ORDER — SODIUM CHLORIDE 0.9% FLUSH: 3.0000 mL | INTRAVENOUS | Status: DC | PRN

## 2021-09-21 MED ORDER — MIDAZOLAM HCL 2 MG/2ML IJ SOLN
INTRAMUSCULAR | Status: AC
Start: 2021-09-21 — End: ?
  Filled 2021-09-21: qty 2

## 2021-09-21 MED ORDER — METOPROLOL TARTRATE 25 MG PO TABS
25.0000 mg | ORAL_TABLET | Freq: Two times a day (BID) | ORAL | Status: DC
  Administered 2021-09-21: 25 mg via ORAL
  Filled 2021-09-21 (×2): qty 1

## 2021-09-21 MED ORDER — SODIUM CHLORIDE 0.9 % IV SOLN: INTRAVENOUS | Status: DC

## 2021-09-21 MED ORDER — NITROGLYCERIN 1 MG/10 ML FOR IR/CATH LAB
INTRA_ARTERIAL | Status: AC
Start: 2021-09-21 — End: ?
  Filled 2021-09-21: qty 10

## 2021-09-21 MED ORDER — IOHEXOL 350 MG/ML SOLN
INTRAVENOUS | Status: DC | PRN
  Administered 2021-09-21: 45 mL

## 2021-09-21 MED ORDER — FERROUS SULFATE 325 (65 FE) MG PO TABS
325.0000 mg | ORAL_TABLET | Freq: Two times a day (BID) | ORAL | Status: DC
  Administered 2021-09-22 – 2021-09-23 (×3): 325 mg via ORAL
  Filled 2021-09-21 (×3): qty 1

## 2021-09-21 MED ORDER — POTASSIUM CHLORIDE CRYS ER 20 MEQ PO TBCR
40.0000 meq | EXTENDED_RELEASE_TABLET | Freq: Once | ORAL | Status: AC
  Administered 2021-09-21: 40 meq via ORAL
  Filled 2021-09-21: qty 2

## 2021-09-21 MED ORDER — CLOPIDOGREL BISULFATE 75 MG PO TABS
300.0000 mg | ORAL_TABLET | Freq: Once | ORAL | Status: AC
  Administered 2021-09-21: 300 mg via ORAL
  Filled 2021-09-21: qty 4

## 2021-09-21 MED ORDER — FENTANYL CITRATE (PF) 100 MCG/2ML IJ SOLN
INTRAMUSCULAR | Status: AC
  Filled 2021-09-21: qty 2

## 2021-09-21 MED ORDER — HYDRALAZINE HCL 20 MG/ML IJ SOLN: 10.0000 mg | INTRAMUSCULAR | Status: AC | PRN

## 2021-09-21 MED ORDER — LIDOCAINE HCL (PF) 1 % IJ SOLN
INTRAMUSCULAR | Status: AC
  Filled 2021-09-21: qty 30

## 2021-09-21 MED ORDER — VERAPAMIL HCL 2.5 MG/ML IV SOLN
INTRAVENOUS | Status: AC
  Filled 2021-09-21: qty 2

## 2021-09-21 MED ORDER — ASPIRIN 325 MG PO TABS
ORAL_TABLET | ORAL | Status: AC
  Filled 2021-09-21: qty 1

## 2021-09-21 MED ORDER — PANTOPRAZOLE SODIUM 40 MG PO TBEC
40.0000 mg | DELAYED_RELEASE_TABLET | Freq: Every day | ORAL | Status: DC
  Administered 2021-09-22 – 2021-09-23 (×2): 40 mg via ORAL
  Filled 2021-09-21 (×3): qty 1

## 2021-09-21 MED ORDER — SODIUM CHLORIDE 0.9 % IV SOLN: INTRAVENOUS | Status: AC

## 2021-09-21 SURGICAL SUPPLY — 14 items
CATH LAUNCHER 6FR EBU3.5 (CATHETERS) ×1 IMPLANT
CATH OPTITORQUE TIG 4.0 5F (CATHETERS) ×1 IMPLANT
GLIDESHEATH SLEND A-KIT 6F 22G (SHEATH) ×1 IMPLANT
GUIDEWIRE INQWIRE 1.5J.035X260 (WIRE) IMPLANT
INQWIRE 1.5J .035X260CM (WIRE) ×2
KIT ENCORE 26 ADVANTAGE (KITS) ×1 IMPLANT
KIT HEART LEFT (KITS) ×2 IMPLANT
MAT PREVALON FULL STRYKER (MISCELLANEOUS) ×1 IMPLANT
PACK CARDIAC CATHETERIZATION (CUSTOM PROCEDURE TRAY) ×2 IMPLANT
SHEATH PROBE COVER 6X72 (BAG) ×1 IMPLANT
TRANSDUCER W/STOPCOCK (MISCELLANEOUS) ×2 IMPLANT
TUBING CIL FLEX 10 FLL-RA (TUBING) ×2 IMPLANT
VALVE COPILOT STAT (MISCELLANEOUS) ×1 IMPLANT
WIRE ASAHI PROWATER 180CM (WIRE) ×1 IMPLANT

## 2021-09-21 NOTE — H&P (View-Only) (Signed)
CARDIOLOGY CONSULT NOTE  Patient ID: Adrian Miller MRN: [id number] DOB/AGE: [birth year] 81 y.o.  Admit date: 09/20/2021 Referring Physician: [facility], [facility] hospitalist Reason for Consultation: Chest pain  HPI:   [name]  with hypertension, 76 PY smoker, h/o possible NSAID induced gastric and duodenal ulcerss, diverticulosis, no recent GI bleeding, presented with chest pain altered mental status.  Patient presented to [facility], ER on 09/20/2021 evening.  Patient had been having left-sided neck and right-sided chest pain off and on on 09/20/2021, for which he took 2 tabs of Percocet 10/325 mg.  After taking his medications, patient was more somnolent and was therefore brought to ER by his daughter.  On arrival to ER, he was confused and altered.  Initial EKG showed anterolateral lateral ST depression, no ST elevation or clear evidence of posterior STEMI.  Dr. Rogene Houston discussed with Dr. Einar Gip.  STEMI alert was canceled.  Continued work-up for altered mental status and chest pain was recommended.  Patient's mental status has since improved.  He is currently chest pain-free.  In ER, patient underwent work-up for altered mental status.  CTh showed no evidence of acute intracranial abnormality, showed atrophy with small vessel ischemic changes.  There is no evidence of traumatic injury cervical spine.  High sensitive troponin increased from 16--> 586-->16k.  K 2.3-->4.0  At baseline, patient does report chest pain on exertion. He is known to have history of 50% disease with documented gastric and duodenal ulcers while he was taking BC's and goodies in the past. Most recent EGD in 08/2020.  This showed 1 benign-appearing intrinsic mild stenosis at the GE junction.  Prepyloric and duodenal ulcers were completely healed.  Colonoscopy showed seven 6-10 mm polyps in the ascending colon, transverse colon, and ascending colon that were removed.  There was mild diverticulosis in the colon,  and severe diverticulosis of left colon, as well as internal hemorrhoids. Patient has not had any recent bright red bleeding. He used to have black stools when he was taking iron supplements, but has noted any recently.    Past Medical History:  Diagnosis Date   Acute esophagitis 06/01/2015   Arthritis    Benign essential HTN 05/31/2015   Duodenal ulcer hemorrhage    Esophageal stricture 06/01/2015   Gastric ulcer    Hiatal hernia 06/01/2015     Past Surgical History:  Procedure Laterality Date   BIOPSY  05/20/2020   Procedure: BIOPSY;  Surgeon: Lavena Bullion, DO;  Location: [facility];  Service: Gastroenterology;;   CHOLECYSTECTOMY N/A 09/30/2017   Procedure: LAPAROSCOPIC CHOLECYSTECTOMY WITH INTRAOPERATIVE CHOLANGIOGRAM;  Surgeon: Excell Seltzer, MD;  Location: [facility];  Service: General;  Laterality: N/A;   COLONOSCOPY     ESOPHAGOGASTRODUODENOSCOPY N/A 05/31/2015   Procedure: ESOPHAGOGASTRODUODENOSCOPY (EGD);  Surgeon: Ladene Artist, MD;  Location: [facility];  Service: Endoscopy;  Laterality: N/A;   ESOPHAGOGASTRODUODENOSCOPY (EGD) WITH PROPOFOL N/A 05/20/2020   Procedure: ESOPHAGOGASTRODUODENOSCOPY (EGD) WITH PROPOFOL;  Surgeon: Lavena Bullion, DO;  Location: [facility];  Service: Gastroenterology;  Laterality: N/A;   HERNIA REPAIR  1975   LEFT ROTATOR CUFF REPAIR  2002   REPLACEMENT TOTAL KNEE Left    REVERSE SHOULDER ARTHROPLASTY Right 03/20/2018   Procedure: RIGHT REVERSE SHOULDER ARTHROPLASTY;  Surgeon: Tania Ade, MD;  Location: [facility];  Service: Orthopedics;  Laterality: Right;   TOTAL KNEE ARTHROPLASTY Right 02/22/2015   Procedure: TOTAL KNEE ARTHROPLASTY;  Surgeon: Melrose Nakayama, MD;  Location: [place];  Service: Orthopedics;  Laterality: Right;  Family History  Problem Relation Age of Onset   Hypertension Mother    Heart attack Sister    Colon cancer Neg Hx    Esophageal cancer Neg Hx    Rectal cancer Neg Hx    Stomach cancer Neg Hx       Social History: Social History   Socioeconomic History   Marital status: Single    Spouse name: Not on file   Number of children: Not on file   Years of education: Not on file   Highest education level: Not on file  Occupational History   Not on file  Tobacco Use   Smoking status: Every Day    Packs/day: 0.50    Years: 51.00    Pack years: 25.50    Types: Cigarettes   Smokeless tobacco: Former    Quit date: 11/03/2014  Vaping Use   Vaping Use: Never used  Substance and Sexual Activity   Alcohol use: No    Alcohol/week: 0.0 standard drinks   Drug use: No   Sexual activity: Never  Other Topics Concern   Not on file  Social History Narrative   Not on file   Social Determinants of Health   Financial Resource Strain: Not on file  Food Insecurity: Not on file  Transportation Needs: Not on file  Physical Activity: Not on file  Stress: Not on file  Social Connections: Not on file  Intimate Partner Violence: Not on file     (Not in a hospital admission)   Review of Systems  Cardiovascular:  Positive for chest pain (Currently resolved). Negative for dyspnea on exertion, leg swelling, palpitations and syncope.     Physical Exam: Physical Exam Vitals and nursing note reviewed.  Constitutional:      General: He is not in acute distress. Neck:     Vascular: No JVD.  Cardiovascular:     Rate and Rhythm: Normal rate and regular rhythm.     Pulses:          Femoral pulses are 3+ on the right side and 3+ on the left side.      Popliteal pulses are 2+ on the right side and 2+ on the left side.       Dorsalis pedis pulses are 0 on the right side and 0 on the left side.       Posterior tibial pulses are 0 on the right side and 0 on the left side.     Heart sounds: Normal heart sounds. No murmur heard. Pulmonary:     Effort: Pulmonary effort is normal.     Breath sounds: Normal breath sounds. No wheezing or rales.  Musculoskeletal:     Right lower leg: No edema.      Left lower leg: No edema.       Imaging/tests reviewed and independently interpreted: Lab Results: Trop HS CBC, BMP CXR  Cardiac Studies:  Telemetry 09/21/2021: Intermittent NSVT up to 8 beats  EKG 09/20/2021: Serial EKGs reviewed Initial EKG with sinus rhythm, old anteroseptal infarct, anterolateral ST depression, which subsequently resolved  Echocardiogram 09/21/2021:  1. Left ventricular ejection fraction, by estimation, is 40 to 45%. The  left ventricle has normal function. The left ventricle demonstrates  regional wall motion abnormalities (see scoring diagram/findings for  description). Left ventricular diastolic  parameters are consistent with Grade I diastolic dysfunction (impaired  relaxation). There is moderate hypokinesis of the left ventricular,  mid-apical anteroseptal wall, inferolateral wall and apical segment.   2. Right  ventricular systolic function is normal. The right ventricular  size is normal. There is mildly elevated pulmonary artery systolic  pressure 40 mmHg.   3. The mitral valve is normal in structure. No evidence of mitral valve  regurgitation. No evidence of mitral stenosis.   4. Tricuspid valve regurgitation is mild to moderate.   5. The aortic valve is calcified. Aortic valve regurgitation is mild. No  aortic stenosis is present.    Assessment & Recommendations:  [name]  with hypertension, 75 PY smoker, h/o possible NSAID induced gastric and duodenal ulcerss, diverticulosis, no recent GI bleeding, presented with chest pain altered mental status.  Chest pain: NSTEMI Index event on 5/17 5 PM, another chest pain episode overnight.  Currently chest pain free. NSVT on monitor. Echocardiogram pending.  Schedule for cardiac catheterization, and possible angioplasty. We discussed regarding risks, benefits, alternatives to this including stress testing, CTA and continued medical therapy. Patient wants to proceed. Understands  <1-2% risk of death, stroke, MI, urgent CABG, bleeding, infection, renal failure but not limited to these.  No ongoing GI bleeding, although risk of GI bleeding is higher than general population. At this time, benefits of PCI outweigh risks, but need to be cautious regarding duration of DAPT. Ideally requires 1 year DAPT< but can be stopped sooner should GI bleeding occurs.  Hypertension: Chlorthalidone at home. Will add low dose beta blocker, if heart rate allows   Altered mental status: Possibly related to narcotic use. Now resolved  Left hand weakness. Right hand is only functional hand. Will perform procedure through left radial access.  Discussed interpretation of tests and management recommendations with the primary team     Nigel Mormon, MD Pager: [phone] Office: [phone]

## 2021-09-21 NOTE — Progress Notes (Signed)
ANTICOAGULATION CONSULT NOTE  Pharmacy Consult for Heparin Indication: chest pain/ACS Brief A/P: Heparin level subtherapeutic Increase Heparin rate  Allergies  Allergen Reactions   Asa [Aspirin] Other (See Comments)    Angioedema- patient is unaware of this (??)   [name] [Aspirin-Salicylamide-Caffeine] Other (See Comments)    bleeding    Patient Measurements: Height: '5\' 9"'$  (175.3 cm) IBW/kg (Calculated) : 70.7 Heparin Dosing Weight: 81.8 kg  Vital Signs: BP: 158/100 (05/18 0530) Pulse Rate: 66 (05/18 0530)  Labs: Recent Labs    09/20/21 1825 09/20/21 2143 09/21/21 0540 09/21/21 0545 09/21/21 0621  HGB 10.6*  --  13.6 14.6  --   HCT 31.6*  --  41.6 43.0  --   PLT 237  --  279  --   --   APTT 28  --   --   --   --   LABPROT 14.9  --   --   --   --   INR 1.2  --   --   --   --   HEPARINUNFRC  --   --   --   --  <0.10*  CREATININE 0.89  --  0.92  --   --   TROPONINIHS 16 586* 16,231*  --   --      CrCl cannot be calculated (Unknown ideal weight.).  Assessment: 81 y.o. male with chest pain for heparin   Goal of Therapy:  Heparin level 0.3-0.7 units/ml Monitor platelets by anticoagulation protocol: Yes   Plan:  Heparin 2000 units IV bolus, then increase heparin  1200 units/hr Check heparin level in 6 hours.   Phillis Knack, PharmD, BCPS

## 2021-09-21 NOTE — Assessment & Plan Note (Signed)
 #)   Acute encephalopathy: Confusion/somnolence that reportedly started earlier in the day, with ensuing improvement, with mental status now reportedly close to or at baseline.  Suspect pharmacologic contribution given that this altered mental status was noted after the patient took 2 tabs of Percocet 10/325 mg as well as Tylenol PM in an effort to treat left-sided neck discomfort as well as right-sided chest pain, as further detailed above.  No additional overt pharmacologic contributions at this time.  No overt evidence of underlying infectious process at this time, including negative COVID-19/flu Enza PCR, while CTA chest, abdomen, pelvis showed no evidence of acute process. Will also check urinalysis to further assess.  Of note, the patient has a mild leukocytosis, he does not possess any additional SIRS criteria at this time.  Therefore, criteria for sepsis not currently met.  No additional overt metabolic/electrolyte contributions at this time.  No overt acute focal neurologic deficits to suggest a contribution from an underlying acute CVA, while CT head shows no evidence of acute intracranial process. Seizures are also felt to be less likely. Will check VBG to evaluate for any contribution from hypercapnic encephalopathy.     Plan: fall precautions. Repeat CMP/CBC in the AM. Check magnesium level. check VBG, TSH, MMA.  Hold home prn Percocet.  Check urinalysis, UDS.  Check ionized calcium level.  Prn Narcan.  Follow-up result of serum ethanol level, which is currently pending.

## 2021-09-21 NOTE — Assessment & Plan Note (Signed)
 #)   Leukocytosis: Mildly elevated lymphocyte count of 12,700 compared to most recent prior blood cell count of 11,700 in February 2023.  No additional SIRS criteria at this time.  No overtly suspected underlying factious process.  Therefore, criteria for sepsis not currently met.  Nature of patient's mildly elevated white blood cell count is not entirely clear at this time. Will check ua for further assess, while closely monitoring ensuing volume status.  Plan: Check urinalysis.  Monitor strict I's and O's and daily weights.  Repeat CBC with differential in the morning.  Further evaluation management of presenting NSTEMI, as above.

## 2021-09-21 NOTE — Assessment & Plan Note (Signed)
 #)   NSTEMI: Diagnosis on the basis of elevated troponin of 586 relative to initial value of 16, with presenting EKG showing less than 1 mm ST depression in inferior leads, with posterior EKG showing no evidence of STEMI, as confirmed by cardiology, who will formally consult, as further detailed above, with additional recommendations pending at this time.  This is in the context of atypical, right-sided chest pain earlier in the day, that was nonexertional.  Patient conveys no residual chest discomfort at this time, and no associated left-sided chest pain.  Given less than 1 mm ST depression in inferior leads as well as mild bradycardia, will refrain from prn sublingual nitroglycerin in case the patient is having a right-sided NSTEMI.   Of note, CTA chest, abdomen, pelvis showed no evidence of acute cardiopulmonary process, including no evidence of aortic aneurysm/dissection, nor any evidence of pneumothorax.  Clinically, acute pulmonary embolism also appears less likely.  Differential includes the possibility of type II process, including the possibility of relative decline in systemic perfusion after presenting with altered mental status in the setting of taking multiple Percocet 10/325 mg tabs.  We will pursue echocardiogram and follow for ensuing evaluation/recommendations from formal cardiology consultation, as above.  Of note, patient has received heparin bolus followed by initiation of heparin drip.  We will closely monitor ensuing hemoglobin trend given his history of peptic ulcer disease as well as multiple previous gastrointestinal bleed episodes.  He has documentation of angioedema in response to aspirin.  Consequently, will refrain from ministration of aspirin.  Will initiate high intensity atorvastatin x1 dose, but refrain from beta-blocker in the context of borderline bradycardia noted in the ED this evening.   Plan: Monitor on telemetry.  Continue to trend troponin.  Cardiology formally  consulted, as above.  Add on serum magnesium level.  Potassium supplementation, as further detailed below.  Continue heparin drip.  Atorvastatin 40 mg p.o. x1 dose now.  Repeat CMP and CBC in the morning.  Echocardiogram ordered for the morning.

## 2021-09-21 NOTE — H&P (Signed)
History and Physical    PLEASE NOTE THAT [employer]AGON [employer] SOFTWARE WAS USED IN THE CONSTRUCTION OF THIS NOTE.   Adrian Miller:[id number] DOB: 05-15-1940 DOA: 09/20/2021  PCP: Janith Lima, MD  Patient coming from: home   I have personally briefly reviewed patient's old medical records in [place]  Chief Complaint: Altered mental status  HPI: Adrian Miller is a 81 y.o. male with medical history significant for hypertension, peptic ulcer disease, chronic iron deficiency anemia associated baseline hemoglobin 67-61, chronic diastolic heart failure, who is admitted to [facility] on 09/20/2021 with NSTEMI after presenting from home to [facility] ED for evaluation of altered mental status.   The following history is provided by the patient as well as his daughter, in addition to my discussions with the EDP and via chart review.  In the setting of left-sided cervical neck discomfort earlier today, which she reports is new for him, in addition to new onset right-sided chest discomfort earlier today, the patient reports that he took Percocet 10/325 mg p.o. x2 tabs earlier today as management for the aforementioned discomfort.  Additionally, he reports that in an effort to try to sleep while experiencing the above pain, he reports that he also took a single Tylenol PM following the 2 tabs of Percocet.  Denies any associated suicidal ideations, but rather emphasizes that the above medications were for symptomatic management of this discomfort, as above.  After taking these medications, the patient was noted by his daughter to be more somnolent than baseline, as well as confused relative to his baseline mental status, prompting patient to be brought to [facility] emergency department for further evaluation management.  In the ED he was initially noted to be somnolent, but via tincture of time, mental status improved, now reportedly at baseline.   The patient reports new onset right-sided chest  discomfort starting earlier today, which she states has been nonexertional, nonpositional, nonpleuritic, not reproducible to palpation over the anterior chest wall.  Describes the right-sided chest pain is sharp and nonradiating in nature, and specifically denies any associated left-sided chest pain.  Denies any associated shortness of breath, palpitations, diaphoresis, dizziness, presyncope, syncope, nausea/vomiting.  Denies any recent trauma.  Denies any associated hemoptysis, new lower extremity erythema, new lower extremity calf tenderness, or new lower extremity edema.  Not associate with any abdominal discomfort, rash, dysuria, gross hematuria.  Denies any associated subjective fever, chills, rigors, or generalized myalgias.  The patient does not believe that he has previously undergone cardiac catheterization, and also does not recall any previous stress test, while denying any known history of underlying CAD.   Per chart review, most recent echocardiogram occurred in January 2017 and was notable for LVEF 60 to 65%, normal left ventricular cavity size, mild LVH, no focal wall motion abnormalities, while showing grade 2 diastolic dysfunction and no evidence of significant valvular pathology.  Medical history also notable for peptic ulcer disease, with documentation of both gastric and duodenal ulcers, with most recent EGD appearing to have occurred in April 2022.  He is on Protonix as an outpatient.  He also has a history of chronic iron deficiency anemia, with baseline hemoglobin range of 10-12, with most recent prior hemoglobin noted to be 11.5 on 06/27/2021.  He is on daily oral iron supplementation as an outpatient.  Denies any recent melena or hematochezia.     ED Course:  Vital signs in the ED were notable for the following: Afebrile; heart rate 54-65;  blood pressure 125/75 -149/84; respiratory rate 15-20, oxygen saturation 95 to 98% on room air.  Labs were notable for the following: CMP  notable for the following: Potassium 2.3, bicarbonate 22, BUN 12, creatinine 0.89 compared to most recent prior creatinine data point of 1.13 on 06/11/2021, calcium, corrected for mild hypoalbuminemia noted to be 7.9, albumin 2.5; other liver enzymes found to be within normal limits.  High-sensitivity troponin I initially noted to be 16, with repeat trending up to 586.  CBC notable for white blood cell count 12,700 with 72% neutrophils compared to most recent prior value of 11.7 on 06/27/2021, hemoglobin 10.6 associated with normocytic/normochromic findings, platelet count 237.  INR 1.2.  COVID-19/influenza PCR negative.  Imaging and additional notable ED work-up: EKG showed sinus bradycardia with heart rate 54, normal intervals, nonspecific T wave inversion in aVL; less than 1 mm ST depression in inferior leads, without evidence of ST elevation, followed by posterior EKG, which showed no evidence of ST elevation.   Chest x-ray showed no evidence of acute cardiopulmonary process, including no evidence of infiltrate, edema, effusion, or pneumothorax.  Noncontrast CT head showed no evidence of acute intracranial process, including no evidence of intracranial hemorrhage or acute infarct.  CT cervical spine showed no evidence of acute cervical spine fracture or any evidence of subluxation.  CTA chest, abdomen, pelvis with dissection protocol showed no evidence of aortic aneurysm or dissection, also showing no evidence of acute cardiopulmonary process, including no evidence of infiltrate, edema, effusion, or pneumothorax; CTA also showed left colonic diverticulosis without evidence of diverticulitis, while showing no evidence of bowel abscess, perforation, and otherwise showed no evidence of acute intra-abdominal or acute intrapelvic process.  In the setting of the patient's right-sided chest pain, with elevated troponin, and less than 1 mm ST depression in inferior leads, EDP discussed the patient's case with on-call  cardiology, who will consult, with additional recommendations pending at this time.  While in the ED, the following were administered: Heparin bolus followed by initiation of heparin drip.  Potassium chloride 20 mill equivalents IV over 2 hours x 1 dose.   Subsequently, the patient was admitted for further evaluation management of suspected NSTEMI, with presenting labs also notable for hypokalemia, hypocalcemia, mild leukocytosis, after presenting with acute encephalopathy, which now appears resolved, as well as atypical right-sided chest pain.    Review of Systems: As per HPI otherwise 10 point review of systems negative.   Past Medical History:  Diagnosis Date   Acute esophagitis 06/01/2015   Arthritis    Benign essential HTN 05/31/2015   Duodenal ulcer hemorrhage    Esophageal stricture 06/01/2015   Gastric ulcer    Hiatal hernia 06/01/2015    Past Surgical History:  Procedure Laterality Date   BIOPSY  05/20/2020   Procedure: BIOPSY;  Surgeon: Lavena Bullion, DO;  Location: [facility] ENDOSCOPY;  Service: Gastroenterology;;   CHOLECYSTECTOMY N/A 09/30/2017   Procedure: LAPAROSCOPIC CHOLECYSTECTOMY WITH INTRAOPERATIVE CHOLANGIOGRAM;  Surgeon: Excell Seltzer, MD;  Location: [facility] ORS;  Service: General;  Laterality: N/A;   COLONOSCOPY     ESOPHAGOGASTRODUODENOSCOPY N/A 05/31/2015   Procedure: ESOPHAGOGASTRODUODENOSCOPY (EGD);  Surgeon: Ladene Artist, MD;  Location: [facility];  Service: Endoscopy;  Laterality: N/A;   ESOPHAGOGASTRODUODENOSCOPY (EGD) WITH PROPOFOL N/A 05/20/2020   Procedure: ESOPHAGOGASTRODUODENOSCOPY (EGD) WITH PROPOFOL;  Surgeon: Lavena Bullion, DO;  Location: [facility] ENDOSCOPY;  Service: Gastroenterology;  Laterality: N/A;   HERNIA REPAIR  1975   LEFT ROTATOR CUFF REPAIR  2002   REPLACEMENT  TOTAL KNEE Left    REVERSE SHOULDER ARTHROPLASTY Right 03/20/2018   Procedure: RIGHT REVERSE SHOULDER ARTHROPLASTY;  Surgeon: Tania Ade, MD;  Location: [place];  Service:  Orthopedics;  Laterality: Right;   TOTAL KNEE ARTHROPLASTY Right 02/22/2015   Procedure: TOTAL KNEE ARTHROPLASTY;  Surgeon: Melrose Nakayama, MD;  Location: [place];  Service: Orthopedics;  Laterality: Right;    Social History:  reports that he has been smoking cigarettes. He has a 25.50 pack-year smoking history. He quit smokeless tobacco use about 6 years ago. He reports that he does not drink alcohol and does not use drugs.   Allergies  Allergen Reactions   Asa [Aspirin] Other (See Comments)    Angioedema- patient is unaware of this (??)   [name] [Aspirin-Salicylamide-Caffeine] Other (See Comments)    bleeding    Family History  Problem Relation Age of Onset   Hypertension Mother    Heart attack Sister    Colon cancer Neg Hx    Esophageal cancer Neg Hx    Rectal cancer Neg Hx    Stomach cancer Neg Hx     Family history reviewed and not pertinent    Prior to Admission medications   Medication Sig Start Date End Date Taking? Authorizing Provider  chlorthalidone (HYGROTON) 25 MG tablet Take 1 tablet (25 mg total) by mouth daily. 06/27/21   Janith Lima, MD  Ferrous Sulfate (IRON) 325 (65 Fe) MG TABS Take 1 tablet (325 mg total) by mouth 2 (two) times daily. 06/28/21   Janith Lima, MD  oxyCODONE-acetaminophen (PERCOCET) 10-325 MG tablet Take 1 tablet by mouth every 6 (six) hours as needed for pain. 09/18/21   Janith Lima, MD  pantoprazole (PROTONIX) 40 MG tablet TAKE 1 TABLET BY MOUTH EVERY DAY 05/22/21   Ladene Artist, MD  potassium chloride SA (KLOR-CON) 20 MEQ tablet Take 1 tablet (20 mEq total) by mouth daily. 03/23/21   Horald Pollen, MD     Objective    Physical Exam: Vitals:   09/20/21 2015 09/20/21 2119 09/20/21 2215 09/20/21 2230  BP: (!) 149/84 138/82  (!) 142/83  Pulse: (!) 57 62 60 (!) 59  Resp: '15 16 20 19  ' Temp:      TempSrc:      SpO2: 95% 96% 97% 96%  Height:    '5\' 9"'  (1.753 m)    General: appears to be stated age;  alert, oriented Skin: warm, dry, no rash Head:  AT/[place] Mouth:  Oral mucosa membranes appear moist, normal dentition Neck: supple; trachea midline Heart:  RRR; did not appreciate any M/R/G Lungs: CTAB, did not appreciate any wheezes, rales, or rhonchi Abdomen: + BS; soft, ND, NT Vascular: 2+ pedal pulses b/l; 2+ radial pulses b/l Extremities: no peripheral edema, no muscle wasting Neuro: strength and sensation intact in upper and lower extremities b/l    Labs on Admission: I have personally reviewed following labs and imaging studies  CBC: Recent Labs  Lab 09/20/21 1825  WBC 12.7*  NEUTROABS 9.1*  HGB 10.6*  HCT 31.6*  MCV 91.6  PLT 242   Basic Metabolic Panel: Recent Labs  Lab 09/20/21 1825  NA 141  K 2.3*  CL 113*  CO2 22  GLUCOSE 108*  BUN 12  CREATININE 0.89  CALCIUM 6.7*   GFR: CrCl cannot be calculated (Unknown ideal weight.). Liver Function Tests: Recent Labs  Lab 09/20/21 1825  AST 11*  ALT 8  ALKPHOS 55  BILITOT 0.4  PROT 5.4*  ALBUMIN 2.5*   No results for input(s): LIPASE, AMYLASE in the last 168 hours. No results for input(s): AMMONIA in the last 168 hours. Coagulation Profile: Recent Labs  Lab 09/20/21 1825  INR 1.2   Cardiac Enzymes: No results for input(s): CKTOTAL, CKMB, CKMBINDEX, TROPONINI in the last 168 hours. BNP (last 3 results) No results for input(s): PROBNP in the last 8760 hours. HbA1C: Recent Labs    09/20/21 1825  HGBA1C 5.7*   CBG: Recent Labs  Lab 09/20/21 1830  GLUCAP 110*   Lipid Profile: Recent Labs    09/20/21 1825  CHOL 110  HDL 33*  LDLCALC 56  TRIG 106  CHOLHDL 3.3   Thyroid Function Tests: No results for input(s): TSH, T4TOTAL, FREET4, T3FREE, THYROIDAB in the last 72 hours. Anemia Panel: No results for input(s): VITAMINB12, FOLATE, FERRITIN, TIBC, IRON, RETICCTPCT in the last 72 hours. Urine analysis:    Component Value Date/Time   COLORURINE YELLOW 01/20/2021 0927   APPEARANCEUR CLEAR  01/20/2021 0927   LABSPEC >1.046 (H) 01/20/2021 0927   PHURINE 5.0 01/20/2021 0927   GLUCOSEU NEGATIVE 01/20/2021 0927   GLUCOSEU NEGATIVE 04/25/2020 1511   HGBUR NEGATIVE 01/20/2021 0927   BILIRUBINUR NEGATIVE 01/20/2021 0927   BILIRUBINUR small 02/05/2012 1931   KETONESUR NEGATIVE 01/20/2021 0927   PROTEINUR NEGATIVE 01/20/2021 0927   UROBILINOGEN 1.0 04/25/2020 1511   NITRITE NEGATIVE 01/20/2021 0927   LEUKOCYTESUR NEGATIVE 01/20/2021 0927    Radiological Exams on Admission: CT Head Wo Contrast  Result Date: 09/20/2021 CLINICAL DATA:  Near syncope, altered mental status EXAM: CT HEAD WITHOUT CONTRAST CT CERVICAL SPINE WITHOUT CONTRAST TECHNIQUE: Multidetector CT imaging of the head and cervical spine was performed following the standard protocol without intravenous contrast. Multiplanar CT image reconstructions of the cervical spine were also generated. RADIATION DOSE REDUCTION: This exam was performed according to the departmental dose-optimization program which includes automated exposure control, adjustment of the mA and/or kV according to patient size and/or use of iterative reconstruction technique. COMPARISON:  01/20/2021 FINDINGS: CT HEAD FINDINGS Brain: No evidence of acute infarction, hemorrhage, hydrocephalus, extra-axial collection or mass lesion/mass effect. Subcortical white matter and periventricular small vessel ischemic changes. Mild chronic left hemispheric atrophy with prominent extra-axial CSF. Vascular: Mild intracranial atherosclerosis. Skull: Normal. Negative for fracture or focal lesion. Sinuses/Orbits: The visualized paranasal sinuses are essentially clear. The mastoid air cells are unopacified. Other: None. CT CERVICAL SPINE FINDINGS Alignment: Reversal of the normal cervical lordosis. Skull base and vertebrae: No acute fracture. No primary bone lesion or focal pathologic process. Soft tissues and spinal canal: No prevertebral fluid or swelling. No visible canal  hematoma. Disc levels: Mild to moderate degenerative changes of the mid cervical spine. Spinal canal is patent. Upper chest: Evaluated on dedicated CTA chest. Other: None. IMPRESSION: No evidence of acute intracranial abnormality. Atrophy with small vessel ischemic changes. No evidence of traumatic injury to the cervical spine. Mild to moderate degenerative changes. Electronically Signed   By: Julian Hy M.D.   On: 09/20/2021 21:27   CT Cervical Spine Wo Contrast  Result Date: 09/20/2021 CLINICAL DATA:  Near syncope, altered mental status EXAM: CT HEAD WITHOUT CONTRAST CT CERVICAL SPINE WITHOUT CONTRAST TECHNIQUE: Multidetector CT imaging of the head and cervical spine was performed following the standard protocol without intravenous contrast. Multiplanar CT image reconstructions of the cervical spine were also generated. RADIATION DOSE REDUCTION: This exam was performed according to the departmental dose-optimization program which includes automated exposure control, adjustment of the  mA and/or kV according to patient size and/or use of iterative reconstruction technique. COMPARISON:  01/20/2021 FINDINGS: CT HEAD FINDINGS Brain: No evidence of acute infarction, hemorrhage, hydrocephalus, extra-axial collection or mass lesion/mass effect. Subcortical white matter and periventricular small vessel ischemic changes. Mild chronic left hemispheric atrophy with prominent extra-axial CSF. Vascular: Mild intracranial atherosclerosis. Skull: Normal. Negative for fracture or focal lesion. Sinuses/Orbits: The visualized paranasal sinuses are essentially clear. The mastoid air cells are unopacified. Other: None. CT CERVICAL SPINE FINDINGS Alignment: Reversal of the normal cervical lordosis. Skull base and vertebrae: No acute fracture. No primary bone lesion or focal pathologic process. Soft tissues and spinal canal: No prevertebral fluid or swelling. No visible canal hematoma. Disc levels: Mild to moderate  degenerative changes of the mid cervical spine. Spinal canal is patent. Upper chest: Evaluated on dedicated CTA chest. Other: None. IMPRESSION: No evidence of acute intracranial abnormality. Atrophy with small vessel ischemic changes. No evidence of traumatic injury to the cervical spine. Mild to moderate degenerative changes. Electronically Signed   By: Julian Hy M.D.   On: 09/20/2021 21:27   DG Chest Port 1 View  Result Date: 09/20/2021 CLINICAL DATA:  Chest pain. EXAM: PORTABLE CHEST 1 VIEW COMPARISON:  June 10, 2021. FINDINGS: The heart size and mediastinal contours are within normal limits. Both lungs are clear. Status post right shoulder arthroplasty. IMPRESSION: No active disease. Electronically Signed   By: Marijo Conception M.D.   On: 09/20/2021 18:45   CT Angio Chest/Abd/Pel for Dissection W and/or W/WO  Result Date: 09/20/2021 CLINICAL DATA:  Right chest pain, evaluate for dissection EXAM: CT ANGIOGRAPHY CHEST, ABDOMEN AND PELVIS TECHNIQUE: Non-contrast CT of the chest was initially obtained. Multidetector CT imaging through the chest, abdomen and pelvis was performed using the standard protocol during bolus administration of intravenous contrast. Multiplanar reconstructed images and MIPs were obtained and reviewed to evaluate the vascular anatomy. RADIATION DOSE REDUCTION: This exam was performed according to the departmental dose-optimization program which includes automated exposure control, adjustment of the mA and/or kV according to patient size and/or use of iterative reconstruction technique. CONTRAST:  122m OMNIPAQUE IOHEXOL 350 MG/ML SOLN COMPARISON:  CT abdomen/pelvis dated 06/10/2021. CT chest dated 01/20/2021. FINDINGS: CTA CHEST FINDINGS Cardiovascular: On unenhanced CT, there is no evidence of intramural hematoma. Following contrast administration, there is preferential opacification of the thoracic aorta. No evidence of thoracic aortic aneurysm or dissection.  Atherosclerotic calcifications of the aortic arch. Study is not tailored for evaluation of the pulmonary arteries. No evidence of central pulmonary embolism. Heart is normal in size. No pericardial effusion. Mild coronary atherosclerosis of the LAD. Mediastinum/Nodes: No suspicious mediastinal lymphadenopathy. Visualized thyroid is unremarkable. Lungs/Pleura: Moderate centrilobular and paraseptal emphysematous changes, upper lung predominant. No suspicious pulmonary nodules. Minimal bibasilar atelectasis. No focal consolidation. No pleural effusion or pneumothorax. Musculoskeletal: Right shoulder arthroplasty. Degenerative changes of the thoracic spine. No fracture is seen. Review of the MIP images confirms the above findings. CTA ABDOMEN AND PELVIS FINDINGS VASCULAR Aorta: No evidence abdominal aortic aneurysm or dissection. Atherosclerotic calcifications. Patent. Celiac: Patent. SMA: Patent. Atherosclerotic calcifications. Renals: Patent bilaterally. Atherosclerotic calcifications at the origin of the left renal artery. IMA: Patent. Inflow: Patent bilaterally. Atherosclerotic calcifications. Veins: Unremarkable. Review of the MIP images confirms the above findings. NON-VASCULAR Hepatobiliary: Liver is within normal limits. Status post cholecystectomy. No intrahepatic or extrahepatic ductal dilatation. Pancreas: Within normal limits. Spleen: Within normal limits. Adrenals/Urinary Tract: Adrenal glands are within normal limits. Bilateral renal cysts, measuring up to 6.1 cm  in the anterior left lower kidney (series 7/image 26). No hydronephrosis. Bladder is within normal limits. Stomach/Bowel: Stomach is within normal limits. No evidence of bowel obstruction. Normal appendix (series 7/image 198). Extensive sigmoid diverticulosis, without evidence of diverticulitis. Lymphatic: No suspicious abdominopelvic lymphadenopathy. Reproductive: Mild prostatomegaly. Other: No abdominopelvic ascites. Musculoskeletal:  Degenerative changes of the lumbar spine. Review of the MIP images confirms the above findings. IMPRESSION: No evidence of thoracoabdominal aortic aneurysm or dissection. No evidence of acute cardiopulmonary disease. Extensive left colonic diverticulosis, without evidence of diverticulitis. Additional ancillary findings as above. Electronically Signed   By: Julian Hy M.D.   On: 09/20/2021 21:37     EKG: Independently reviewed, with result as described above.    Assessment/Plan   Principal Problem:   NSTEMI (non-ST elevated myocardial infarction) ([place]) Active Problems:   Leukocytosis   Benign essential HTN   Hypokalemia   Hypocalcemia   Acute encephalopathy   Chronic iron deficiency anemia   Chronic diastolic CHF (congestive heart failure) ([place])      #) NSTEMI: Diagnosis on the basis of elevated troponin of 586 relative to initial value of 16, with presenting EKG showing less than 1 mm ST depression in inferior leads, with posterior EKG showing no evidence of STEMI, as confirmed by cardiology, who will formally consult, as further detailed above, with additional recommendations pending at this time.  This is in the context of atypical, right-sided chest pain earlier in the day, that was nonexertional.  Patient conveys no residual chest discomfort at this time, and no associated left-sided chest pain.  Given less than 1 mm ST depression in inferior leads as well as mild bradycardia, will refrain from prn sublingual nitroglycerin in case the patient is having a right-sided NSTEMI.   Of note, CTA chest, abdomen, pelvis showed no evidence of acute cardiopulmonary process, including no evidence of aortic aneurysm/dissection, nor any evidence of pneumothorax.  Clinically, acute pulmonary embolism also appears less likely.  Differential includes the possibility of type II process, including the possibility of relative decline in systemic perfusion after presenting with altered mental status  in the setting of taking multiple Percocet 10/325 mg tabs.  We will pursue echocardiogram and follow for ensuing evaluation/recommendations from formal cardiology consultation, as above.  Of note, patient has received heparin bolus followed by initiation of heparin drip.  We will closely monitor ensuing hemoglobin trend given his history of peptic ulcer disease as well as multiple previous gastrointestinal bleed episodes.  He has documentation of angioedema in response to aspirin.  Consequently, will refrain from ministration of aspirin.  Will initiate high intensity atorvastatin x1 dose, but refrain from beta-blocker in the context of borderline bradycardia noted in the ED this evening.   Plan: Monitor on telemetry.  Continue to trend troponin.  Cardiology formally consulted, as above.  Add on serum magnesium level.  Potassium supplementation, as further detailed below.  Continue heparin drip.  Atorvastatin 40 mg p.o. x1 dose now.  Repeat CMP and CBC in the morning.  Echocardiogram ordered for the morning.        #) Hypokalemia: Presenting serum potassium level found to be 2.3.  It appears that the patient has a history of recurrent hypokalemia, as he is on daily oral potassium supplementation as an outpatient.  In the context of presenting NSTEMI, will pursue additional supplementation efforts, as further detailed below.  Of note, he has received a total of 20 mEq of IV potassium chloride in the ED this evening.  Plan: Additional  20 mill equivalents of IV potassium chloride.  Potassium chloride 40 mEq p.o. x1 dose now.  Repeat CMP in the morning.  Add on serum magnesium level.  Monitor on telemetry.          #) Acute encephalopathy: Confusion/somnolence that reportedly started earlier in the day, with ensuing improvement, with mental status now reportedly close to or at baseline.  Suspect pharmacologic contribution given that this altered mental status was noted after the patient took 2  tabs of Percocet 10/325 mg as well as Tylenol PM in an effort to treat left-sided neck discomfort as well as right-sided chest pain, as further detailed above.  No additional overt pharmacologic contributions at this time.  No overt evidence of underlying infectious process at this time, including negative COVID-19/flu Enza PCR, while CTA chest, abdomen, pelvis showed no evidence of acute process. Will also check urinalysis to further assess.  Of note, the patient has a mild leukocytosis, he does not possess any additional SIRS criteria at this time.  Therefore, criteria for sepsis not currently met.  No additional overt metabolic/electrolyte contributions at this time.  No overt acute focal neurologic deficits to suggest a contribution from an underlying acute CVA, while CT head shows no evidence of acute intracranial process. Seizures are also felt to be less likely. Will check VBG to evaluate for any contribution from hypercapnic encephalopathy.     Plan: fall precautions. Repeat CMP/CBC in the AM. Check magnesium level. check VBG, TSH, MMA.  Hold home prn Percocet.  Check urinalysis, UDS.  Check ionized calcium level.  Prn Narcan.  Follow-up result of serum ethanol level, which is currently pending.        #) Hypocalcemia: Presenting serum calcium, corrected for mild hypoalbuminemia noted to be 7.9.  Anticipate ensuing supplementation thereof, pending result of serum magnesium level.   Plan: Check ionized calcium level.  Add on serum magnesium level.  Repeat CMP in the morning.  Repeat serum magnesium level in the morning as well.         #) Leukocytosis: Mildly elevated lymphocyte count of 12,700 compared to most recent prior blood cell count of 11,700 in February 2023.  No additional SIRS criteria at this time.  No overtly suspected underlying factious process.  Therefore, criteria for sepsis not currently met.  Nature of patient's mildly elevated white blood cell count is not  entirely clear at this time. Will check ua for further assess, while closely monitoring ensuing volume status.  Plan: Check urinalysis.  Monitor strict I's and O's and daily weights.  Repeat CBC with differential in the morning.  Further evaluation management of presenting NSTEMI, as above.          #) Essential Hypertension: documented h/o such, with outpatient antihypertensive regimen including chlorthalidone.  SBP's in the ED today: In the 120s to 140s mmHg.  In the setting of presenting NSTEMI with potential right-sided involvement given less than 1 mm ST depression in inferior leads, will hold home chlorthalidone for now.   Plan: Close monitoring of subsequent BP via routine VS. hold home chlorthalidone for now, as above.        #) Chronic iron deficiency anemia: Documented history of such, associate with baseline hemoglobin range of 10-12, with presenting hemoglobin found to be consistent with this range.  No evidence of active bleed at this time.  Notable past medical history includes history of peptic ulcer disease, with multiple previous episodes of GI bleeding.  Presenting BUN reassuring given its lack  of elevation.  Patient appears hemodynamically stable.  Not on any blood thinners as an outpatient.  However, will closely monitor ensuing hemoglobin trend given plan for heparin drip in the setting of presenting NSTEMI, as further detailed above.  He is on Protonix as an outpatient.  Presenting INR non-elevated.   Plan: Continue outpatient Protonix.  Repeat CBC in the morning.           #) Chronic diastolic heart failure: documented history of such, with most recent echocardiogram performed in January 2017 notable for LVEF 60 to 65% as well as grade 2 diastolic dysfunction. No clinical or radiographic evidence to suggest acutely decompensated heart failure at this time. home diuretic regimen reportedly consists of the following: Chlorthalidone in the absence of any loop  diuretics.  In the setting of clinical evidence to suggest presenting euvolemia, as well as presenting NSTEMI with potential right-sided involvement given less than 1 mm ST depression in inferior leads, will hold home chlorthalidone for now.   Plan: monitor strict I's & O's and daily weights. Repeat BMP in AM. Check serum mag level.  Hold home chlorthalidone for now, as above.  Add on BNP.  Further evaluation management of presenting NSTEMI, as above, including echocardiogram.        DVT prophylaxis: Heparin drip, as above Code Status: Full code Family Communication: Patient's daughter, as further detailed above Disposition Plan: Per Rounding Team Consults called: On-call cardiology consulted, as further detailed above;  Admission status: Inpatient    PLEASE NOTE THAT DRAGON DICTATION SOFTWARE WAS USED IN THE CONSTRUCTION OF THIS NOTE.   [place] DO [facility]  From [place]   09/21/2021, 12:23 AM

## 2021-09-21 NOTE — Assessment & Plan Note (Signed)
 #)   Hypocalcemia: Presenting serum calcium, corrected for mild hypoalbuminemia noted to be 7.9.  Anticipate ensuing supplementation thereof, pending result of serum magnesium level.   Plan: Check ionized calcium level.  Add on serum magnesium level.  Repeat CMP in the morning.  Repeat serum magnesium level in the morning as well.

## 2021-09-21 NOTE — Progress Notes (Signed)
PROGRESS NOTE        PATIENT DETAILS Name: Adrian Miller Age: 81 y.o. Sex: male Date of Birth: 1941-04-08 Admit Date: 09/20/2021 Admitting Physician Rhetta Mura, DO NOB:SJGGE, Arvid Right, MD  Brief Summary: Patient is a 81 y.o.  male with history of HTN, upper GI bleeding due to gastric ulcer-2022, lower GI bleeding due to diverticulosis February 2023-presented to the ED with some confusion-as he developed severe retrosternal chest pain/left neck pain for which she took narcotic and Benadryl.  He was subsequently found to have non-STEMI and admitted to the hospitalist service.   Significant events: 5/17>> admit to [facility]-non-STEMI  Significant studies: 5/17>> CXR: No PNA 5/17>> CT head: No acute intracranial abnormality. 5/17>> CT C-spine: Degenerative changes-no fracture. 5/17>> CT angio chest/abdomen/pelvis: No central PE-no evidence of dissection.  Significant microbiology data: 5/17>> COVID/influenza PCR: Negative  Procedures: None  Consults: Cardiology  Subjective: Lying comfortably in bed-denies any chest pain or shortness of breath.  Objective: Vitals: Blood pressure (!) 143/86, pulse 74, temperature (!) 97.5 F (36.4 C), temperature source Oral, resp. rate 20, height '5\' 9"'$  (1.753 m), SpO2 96 %.   Exam: Gen Exam:Alert awake-not in any distress HEENT:atraumatic, normocephalic Chest: B/L clear to auscultation anteriorly CVS:S1S2 regular Abdomen:soft non tender, non distended Extremities:no edema Neurology: Non focal Skin: no rash  Pertinent Labs/Radiology:    Latest Ref Rng & Units 09/21/2021    5:45 AM 09/21/2021    5:40 AM 09/20/2021    6:25 PM  CBC  WBC 4.0 - 10.5 K/uL  13.1   12.7    Hemoglobin 13.0 - 17.0 g/dL 14.6   13.6   10.6    Hematocrit 39.0 - 52.0 % 43.0   41.6   31.6    Platelets 150 - 400 K/uL  279   237      Lab Results  Component Value Date   NA 136 09/21/2021   K 4.0 09/21/2021   CL 102 09/21/2021   CO2 24  09/21/2021      Assessment/Plan: Non-STEMI: No further chest pain-allergic to aspirin-on heparin/beta-blocker/statin-discussed with cards-LHC planned today.  Acute toxic encephalopathy: Due to combination of narcotics/Benadryl that the patient took for severe chest pain/neck pain.  Has resolved with supportive care.  CT head/C-spine negative for acute abnormalities.  HTN: BP relatively stable but on the higher side-starting beta-blocker.  Chronic HFpEF: Volume status stable.  History of upper GI bleeding with gastric ulcer 2022: Continue PPI  History of lower GI bleeding due to diverticulosis February 2023: No reoccurrence-hemoglobin stable   BMI: Estimated body mass index is 26.58 kg/m as calculated from the following:   Height as of this encounter: '5\' 9"'$  (1.753 m).   Weight as of 06/27/21: 81.6 kg.   Code status:   Code Status: Full Code   DVT Prophylaxis: IV heparin  Family Communication: None at bedside   Disposition Plan: Status is: Inpatient Remains inpatient appropriate because: Non-STEMI-LHC today.   Planned Discharge Destination:Home   Diet: Diet Order             Diet NPO time specified  Diet effective now                     Antimicrobial agents: Anti-infectives (From admission, onward)    None        MEDICATIONS: Scheduled Meds:  [START  ON 09/22/2021] ferrous sulfate  325 mg Oral BID WC   pantoprazole  40 mg Oral Daily   Continuous Infusions:  sodium chloride 20 mL/hr at 09/20/21 1912   heparin 1,200 Units/hr (09/21/21 0717)   PRN Meds:.acetaminophen **OR** acetaminophen, naLOXone (NARCAN)  injection, nicotine   I have personally reviewed following labs and imaging studies  LABORATORY DATA: CBC: Recent Labs  Lab 09/20/21 1825 09/21/21 0540 09/21/21 0545  WBC 12.7* 13.1*  --   NEUTROABS 9.1* 10.4*  --   HGB 10.6* 13.6 14.6  HCT 31.6* 41.6 43.0  MCV 91.6 90.8  --   PLT 237 279  --     Basic Metabolic Panel: Recent  Labs  Lab 09/20/21 1825 09/21/21 0045 09/21/21 0540 09/21/21 0545  NA 141  --  136 136  K 2.3*  --  3.7 4.0  CL 113*  --  102  --   CO2 22  --  24  --   GLUCOSE 108*  --  126*  --   BUN 12  --  9  --   CREATININE 0.89  --  0.92  --   CALCIUM 6.7*  --  9.1  --   MG  --  1.7 1.8  --     GFR: CrCl cannot be calculated (Unknown ideal weight.).  Liver Function Tests: Recent Labs  Lab 09/20/21 1825 09/21/21 0540  AST 11* 93*  ALT 8 17  ALKPHOS 55 76  BILITOT 0.4 0.7  PROT 5.4* 7.6  ALBUMIN 2.5* 3.4*   No results for input(s): LIPASE, AMYLASE in the last 168 hours. No results for input(s): AMMONIA in the last 168 hours.  Coagulation Profile: Recent Labs  Lab 09/20/21 1825  INR 1.2    Cardiac Enzymes: No results for input(s): CKTOTAL, CKMB, CKMBINDEX, TROPONINI in the last 168 hours.  BNP (last 3 results) No results for input(s): PROBNP in the last 8760 hours.  Lipid Profile: Recent Labs    09/20/21 1825  CHOL 110  HDL 33*  LDLCALC 56  TRIG 106  CHOLHDL 3.3    Thyroid Function Tests: Recent Labs    09/21/21 0045  TSH 0.486    Anemia Panel: No results for input(s): VITAMINB12, FOLATE, FERRITIN, TIBC, IRON, RETICCTPCT in the last 72 hours.  Urine analysis:    Component Value Date/Time   COLORURINE YELLOW 09/21/2021 0001   APPEARANCEUR CLEAR 09/21/2021 0001   LABSPEC 1.035 (H) 09/21/2021 0001   PHURINE 6.0 09/21/2021 0001   GLUCOSEU NEGATIVE 09/21/2021 0001   GLUCOSEU NEGATIVE 04/25/2020 1511   HGBUR NEGATIVE 09/21/2021 0001   BILIRUBINUR NEGATIVE 09/21/2021 0001   BILIRUBINUR small 02/05/2012 1931   KETONESUR NEGATIVE 09/21/2021 0001   PROTEINUR NEGATIVE 09/21/2021 0001   UROBILINOGEN 1.0 04/25/2020 1511   NITRITE NEGATIVE 09/21/2021 0001   LEUKOCYTESUR NEGATIVE 09/21/2021 0001    Sepsis Labs: Lactic Acid, Venous    Component Value Date/Time   LATICACIDVEN 1.3 01/20/2021 0447    MICROBIOLOGY: Recent Results (from the past 240  hour(s))  Resp Panel by RT-PCR (Flu A&B, Covid) Nasopharyngeal Swab     Status: None   Collection Time: 09/20/21  6:31 PM   Specimen: Nasopharyngeal Swab; Nasopharyngeal(NP) swabs in vial transport medium  Result Value Ref Range Status   SARS Coronavirus 2 by RT PCR NEGATIVE NEGATIVE Final    Comment: (NOTE) SARS-CoV-2 target nucleic acids are NOT DETECTED.  The SARS-CoV-2 RNA is generally detectable in upper respiratory specimens during the acute phase of infection. The  lowest concentration of SARS-CoV-2 viral copies this assay can detect is 138 copies/mL. A negative result does not preclude SARS-Cov-2 infection and should not be used as the sole basis for treatment or other patient management decisions. A negative result may occur with  improper specimen collection/handling, submission of specimen other than nasopharyngeal swab, presence of viral mutation(s) within the areas targeted by this assay, and inadequate number of viral copies(<138 copies/mL). A negative result must be combined with clinical observations, patient history, and epidemiological information. The expected result is Negative.  Fact Sheet for Patients:  EntrepreneurPulse.com.au  Fact Sheet for Healthcare Providers:  IncredibleEmployment.be  This test is no t yet approved or cleared by the [place] FDA and  has been authorized for detection and/or diagnosis of SARS-CoV-2 by FDA under an Emergency Use Authorization (EUA). This EUA will remain  in effect (meaning this test can be used) for the duration of the COVID-19 declaration under Section 564(b)(1) of the Act, 21 U.S.C.section 360bbb-3(b)(1), unless the authorization is terminated  or revoked sooner.       Influenza A by PCR NEGATIVE NEGATIVE Final   Influenza B by PCR NEGATIVE NEGATIVE Final    Comment: (NOTE) The Xpert Xpress SARS-CoV-2/FLU/RSV plus assay is intended as an aid in the diagnosis of influenza from  Nasopharyngeal swab specimens and should not be used as a sole basis for treatment. Nasal washings and aspirates are unacceptable for Xpert Xpress SARS-CoV-2/FLU/RSV testing.  Fact Sheet for Patients: EntrepreneurPulse.com.au  Fact Sheet for Healthcare Providers: IncredibleEmployment.be  This test is not yet approved or cleared by the [place] FDA and has been authorized for detection and/or diagnosis of SARS-CoV-2 by FDA under an Emergency Use Authorization (EUA). This EUA will remain in effect (meaning this test can be used) for the duration of the COVID-19 declaration under Section 564(b)(1) of the Act, 21 U.S.C. section 360bbb-3(b)(1), unless the authorization is terminated or revoked.  Performed at [facility], [place] [address] [place], [place] [address]     RADIOLOGY STUDIES/RESULTS: CT Head Wo Contrast  Result Date: 09/20/2021 CLINICAL DATA:  Near syncope, altered mental status EXAM: CT HEAD WITHOUT CONTRAST CT CERVICAL SPINE WITHOUT CONTRAST TECHNIQUE: Multidetector CT imaging of the head and cervical spine was performed following the standard protocol without intravenous contrast. Multiplanar CT image reconstructions of the cervical spine were also generated. RADIATION DOSE REDUCTION: This exam was performed according to the departmental dose-optimization program which includes automated exposure control, adjustment of the mA and/or kV according to patient size and/or use of iterative reconstruction technique. COMPARISON:  01/20/2021 FINDINGS: CT HEAD FINDINGS Brain: No evidence of acute infarction, hemorrhage, hydrocephalus, extra-axial collection or mass lesion/mass effect. Subcortical white matter and periventricular small vessel ischemic changes. Mild chronic left hemispheric atrophy with prominent extra-axial CSF. Vascular: Mild intracranial atherosclerosis. Skull: Normal. Negative for fracture or focal lesion. Sinuses/Orbits: The  visualized paranasal sinuses are essentially clear. The mastoid air cells are unopacified. Other: None. CT CERVICAL SPINE FINDINGS Alignment: Reversal of the normal cervical lordosis. Skull base and vertebrae: No acute fracture. No primary bone lesion or focal pathologic process. Soft tissues and spinal canal: No prevertebral fluid or swelling. No visible canal hematoma. Disc levels: Mild to moderate degenerative changes of the mid cervical spine. Spinal canal is patent. Upper chest: Evaluated on dedicated CTA chest. Other: None. IMPRESSION: No evidence of acute intracranial abnormality. Atrophy with small vessel ischemic changes. No evidence of traumatic injury to the cervical spine. Mild to moderate degenerative changes. Electronically  Signed   By: Julian Hy M.D.   On: 09/20/2021 21:27   CT Cervical Spine Wo Contrast  Result Date: 09/20/2021 CLINICAL DATA:  Near syncope, altered mental status EXAM: CT HEAD WITHOUT CONTRAST CT CERVICAL SPINE WITHOUT CONTRAST TECHNIQUE: Multidetector CT imaging of the head and cervical spine was performed following the standard protocol without intravenous contrast. Multiplanar CT image reconstructions of the cervical spine were also generated. RADIATION DOSE REDUCTION: This exam was performed according to the departmental dose-optimization program which includes automated exposure control, adjustment of the mA and/or kV according to patient size and/or use of iterative reconstruction technique. COMPARISON:  01/20/2021 FINDINGS: CT HEAD FINDINGS Brain: No evidence of acute infarction, hemorrhage, hydrocephalus, extra-axial collection or mass lesion/mass effect. Subcortical white matter and periventricular small vessel ischemic changes. Mild chronic left hemispheric atrophy with prominent extra-axial CSF. Vascular: Mild intracranial atherosclerosis. Skull: Normal. Negative for fracture or focal lesion. Sinuses/Orbits: The visualized paranasal sinuses are essentially  clear. The mastoid air cells are unopacified. Other: None. CT CERVICAL SPINE FINDINGS Alignment: Reversal of the normal cervical lordosis. Skull base and vertebrae: No acute fracture. No primary bone lesion or focal pathologic process. Soft tissues and spinal canal: No prevertebral fluid or swelling. No visible canal hematoma. Disc levels: Mild to moderate degenerative changes of the mid cervical spine. Spinal canal is patent. Upper chest: Evaluated on dedicated CTA chest. Other: None. IMPRESSION: No evidence of acute intracranial abnormality. Atrophy with small vessel ischemic changes. No evidence of traumatic injury to the cervical spine. Mild to moderate degenerative changes. Electronically Signed   By: Julian Hy M.D.   On: 09/20/2021 21:27   DG Chest Port 1 View  Result Date: 09/20/2021 CLINICAL DATA:  Chest pain. EXAM: PORTABLE CHEST 1 VIEW COMPARISON:  June 10, 2021. FINDINGS: The heart size and mediastinal contours are within normal limits. Both lungs are clear. Status post right shoulder arthroplasty. IMPRESSION: No active disease. Electronically Signed   By: Marijo Conception M.D.   On: 09/20/2021 18:45   CT Angio Chest/Abd/Pel for Dissection W and/or W/WO  Result Date: 09/20/2021 CLINICAL DATA:  Right chest pain, evaluate for dissection EXAM: CT ANGIOGRAPHY CHEST, ABDOMEN AND PELVIS TECHNIQUE: Non-contrast CT of the chest was initially obtained. Multidetector CT imaging through the chest, abdomen and pelvis was performed using the standard protocol during bolus administration of intravenous contrast. Multiplanar reconstructed images and MIPs were obtained and reviewed to evaluate the vascular anatomy. RADIATION DOSE REDUCTION: This exam was performed according to the departmental dose-optimization program which includes automated exposure control, adjustment of the mA and/or kV according to patient size and/or use of iterative reconstruction technique. CONTRAST:  164m OMNIPAQUE IOHEXOL  350 MG/ML SOLN COMPARISON:  CT abdomen/pelvis dated 06/10/2021. CT chest dated 01/20/2021. FINDINGS: CTA CHEST FINDINGS Cardiovascular: On unenhanced CT, there is no evidence of intramural hematoma. Following contrast administration, there is preferential opacification of the thoracic aorta. No evidence of thoracic aortic aneurysm or dissection. Atherosclerotic calcifications of the aortic arch. Study is not tailored for evaluation of the pulmonary arteries. No evidence of central pulmonary embolism. Heart is normal in size. No pericardial effusion. Mild coronary atherosclerosis of the LAD. Mediastinum/Nodes: No suspicious mediastinal lymphadenopathy. Visualized thyroid is unremarkable. Lungs/Pleura: Moderate centrilobular and paraseptal emphysematous changes, upper lung predominant. No suspicious pulmonary nodules. Minimal bibasilar atelectasis. No focal consolidation. No pleural effusion or pneumothorax. Musculoskeletal: Right shoulder arthroplasty. Degenerative changes of the thoracic spine. No fracture is seen. Review of the MIP images confirms the above findings. CTA  ABDOMEN AND PELVIS FINDINGS VASCULAR Aorta: No evidence abdominal aortic aneurysm or dissection. Atherosclerotic calcifications. Patent. Celiac: Patent. SMA: Patent. Atherosclerotic calcifications. Renals: Patent bilaterally. Atherosclerotic calcifications at the origin of the left renal artery. IMA: Patent. Inflow: Patent bilaterally. Atherosclerotic calcifications. Veins: Unremarkable. Review of the MIP images confirms the above findings. NON-VASCULAR Hepatobiliary: Liver is within normal limits. Status post cholecystectomy. No intrahepatic or extrahepatic ductal dilatation. Pancreas: Within normal limits. Spleen: Within normal limits. Adrenals/Urinary Tract: Adrenal glands are within normal limits. Bilateral renal cysts, measuring up to 6.1 cm in the anterior left lower kidney (series 7/image 26). No hydronephrosis. Bladder is within normal  limits. Stomach/Bowel: Stomach is within normal limits. No evidence of bowel obstruction. Normal appendix (series 7/image 198). Extensive sigmoid diverticulosis, without evidence of diverticulitis. Lymphatic: No suspicious abdominopelvic lymphadenopathy. Reproductive: Mild prostatomegaly. Other: No abdominopelvic ascites. Musculoskeletal: Degenerative changes of the lumbar spine. Review of the MIP images confirms the above findings. IMPRESSION: No evidence of thoracoabdominal aortic aneurysm or dissection. No evidence of acute cardiopulmonary disease. Extensive left colonic diverticulosis, without evidence of diverticulitis. Additional ancillary findings as above. Electronically Signed   By: Julian Hy M.D.   On: 09/20/2021 21:37     LOS: 1 day   Oren Binet, MD  [facility]    To contact the attending provider between 7A-7P or the covering provider during after hours 7P-7A, please log into the web site [email or web address] and access using universal [place] password for that web site. If you do not have the password, please call the hospital operator.  09/21/2021, 10:12 AM

## 2021-09-21 NOTE — Assessment & Plan Note (Signed)
  #)   Essential Hypertension: documented h/o such, with outpatient antihypertensive regimen including chlorthalidone.  SBP's in the ED today: In the 120s to 140s mmHg.  In the setting of presenting NSTEMI with potential right-sided involvement given less than 1 mm ST depression in inferior leads, will hold home chlorthalidone for now.   Plan: Close monitoring of subsequent BP via routine VS. hold home chlorthalidone for now, as above.

## 2021-09-21 NOTE — Interval H&P Note (Signed)
History and Physical Interval Note:  09/21/2021 1:39 PM  Adrian Miller  has presented today for surgery, with the diagnosis of nstemi.  The various methods of treatment have been discussed with the patient and family. After consideration of risks, benefits and other options for treatment, the patient has consented to  Procedure(s): LEFT HEART CATH AND CORONARY ANGIOGRAPHY (N/A) as a surgical intervention.  The patient's history has been reviewed, patient examined, no change in status, stable for surgery.  I have reviewed the patient's chart and labs.  Questions were answered to the patient's satisfaction.    2016 Appropriate Use Criteria for Coronary Revascularization in Patients With Acute Coronary Syndrome NSTEMI/UA High Risk (TIMI Score 5-7) NSTEMI/Unstable angina, stabilized patient at high risk Link Here: sistemancia.com Indication:  Revascularization by PCI or CABG of 1 or more arteries in a patient with NSTEMI or unstable angina with Stabilization after presentation High risk for clinical events A (7) Indication: 16; Score 7   [place]

## 2021-09-21 NOTE — ED Notes (Signed)
This paramedic called and updated patient daughter.

## 2021-09-21 NOTE — Progress Notes (Signed)
  Echocardiogram 2D Echocardiogram has been performed.  Adrian Miller 09/21/2021, 11:11 AM

## 2021-09-21 NOTE — Assessment & Plan Note (Signed)
 #)   Hypokalemia: Presenting serum potassium level found to be 2.3.  It appears that the patient has a history of recurrent hypokalemia, as he is on daily oral potassium supplementation as an outpatient.  In the context of presenting NSTEMI, will pursue additional supplementation efforts, as further detailed below.  Of note, he has received a total of 20 mEq of IV potassium chloride in the ED this evening.  Plan: Additional 20 mill equivalents of IV potassium chloride.  Potassium chloride 40 mEq p.o. x1 dose now.  Repeat CMP in the morning.  Add on serum magnesium level.  Monitor on telemetry.

## 2021-09-21 NOTE — Progress Notes (Signed)
  Transition of Care [facility]) Screening Note   Patient Details  Name: ADYAN PALAU Date of Birth: 23-Jun-1940   Transition of [facility]) CM/SW Contact:    Milas Gain, [place] Phone Number: 09/21/2021, 4:39 PM    Transition of Care Department [facility]) has reviewed patient and no TOC needs have been identified at this time. We will continue to monitor patient advancement through interdisciplinary progression rounds. If new patient transition needs arise, please place a TOC consult.

## 2021-09-21 NOTE — Consult Note (Addendum)
CARDIOLOGY CONSULT NOTE  Patient ID: KRISTY CATOE MRN: [id number] DOB/AGE: Oct 11, 1940 81 y.o.  Admit date: 09/20/2021 Referring Physician: [facility], [facility] hospitalist Reason for Consultation: Chest pain  HPI:   [name]  with hypertension, 61 PY smoker, h/o possible NSAID induced gastric and duodenal ulcerss, diverticulosis, no recent GI bleeding, presented with chest pain altered mental status.  Patient presented to [facility], ER on 09/20/2021 evening.  Patient had been having left-sided neck and right-sided chest pain off and on on 09/20/2021, for which he took 2 tabs of Percocet 10/325 mg.  After taking his medications, patient was more somnolent and was therefore brought to ER by his daughter.  On arrival to ER, he was confused and altered.  Initial EKG showed anterolateral lateral ST depression, no ST elevation or clear evidence of posterior STEMI.  Dr. Rogene Houston discussed with Dr. Einar Gip.  STEMI alert was canceled.  Continued work-up for altered mental status and chest pain was recommended.  Patient's mental status has since improved.  He is currently chest pain-free.  In ER, patient underwent work-up for altered mental status.  CTh showed no evidence of acute intracranial abnormality, showed atrophy with small vessel ischemic changes.  There is no evidence of traumatic injury cervical spine.  High sensitive troponin increased from 16--> 586-->16k.  K 2.3-->4.0  At baseline, patient does report chest pain on exertion. He is known to have history of 50% disease with documented gastric and duodenal ulcers while he was taking BC's and goodies in the past. Most recent EGD in 08/2020.  This showed 1 benign-appearing intrinsic mild stenosis at the GE junction.  Prepyloric and duodenal ulcers were completely healed.  Colonoscopy showed seven 6-10 mm polyps in the ascending colon, transverse colon, and ascending colon that were removed.  There was mild diverticulosis in the colon,  and severe diverticulosis of left colon, as well as internal hemorrhoids. Patient has not had any recent bright red bleeding. He used to have black stools when he was taking iron supplements, but has noted any recently.    Past Medical History:  Diagnosis Date   Acute esophagitis 06/01/2015   Arthritis    Benign essential HTN 05/31/2015   Duodenal ulcer hemorrhage    Esophageal stricture 06/01/2015   Gastric ulcer    Hiatal hernia 06/01/2015     Past Surgical History:  Procedure Laterality Date   BIOPSY  05/20/2020   Procedure: BIOPSY;  Surgeon: Lavena Bullion, DO;  Location: [facility];  Service: Gastroenterology;;   CHOLECYSTECTOMY N/A 09/30/2017   Procedure: LAPAROSCOPIC CHOLECYSTECTOMY WITH INTRAOPERATIVE CHOLANGIOGRAM;  Surgeon: Excell Seltzer, MD;  Location: [facility];  Service: General;  Laterality: N/A;   COLONOSCOPY     ESOPHAGOGASTRODUODENOSCOPY N/A 05/31/2015   Procedure: ESOPHAGOGASTRODUODENOSCOPY (EGD);  Surgeon: Ladene Artist, MD;  Location: [facility];  Service: Endoscopy;  Laterality: N/A;   ESOPHAGOGASTRODUODENOSCOPY (EGD) WITH PROPOFOL N/A 05/20/2020   Procedure: ESOPHAGOGASTRODUODENOSCOPY (EGD) WITH PROPOFOL;  Surgeon: Lavena Bullion, DO;  Location: [facility];  Service: Gastroenterology;  Laterality: N/A;   HERNIA REPAIR  1975   LEFT ROTATOR CUFF REPAIR  2002   REPLACEMENT TOTAL KNEE Left    REVERSE SHOULDER ARTHROPLASTY Right 03/20/2018   Procedure: RIGHT REVERSE SHOULDER ARTHROPLASTY;  Surgeon: Tania Ade, MD;  Location: [facility];  Service: Orthopedics;  Laterality: Right;   TOTAL KNEE ARTHROPLASTY Right 02/22/2015   Procedure: TOTAL KNEE ARTHROPLASTY;  Surgeon: Melrose Nakayama, MD;  Location: [facility];  Service: Orthopedics;  Laterality: Right;  Family History  Problem Relation Age of Onset   Hypertension Mother    Heart attack Sister    Colon cancer Neg Hx    Esophageal cancer Neg Hx    Rectal cancer Neg Hx    Stomach cancer Neg Hx       Social History: Social History   Socioeconomic History   Marital status: Single    Spouse name: Not on file   Number of children: Not on file   Years of education: Not on file   Highest education level: Not on file  Occupational History   Not on file  Tobacco Use   Smoking status: Every Day    Packs/day: 0.50    Years: 51.00    Pack years: 25.50    Types: Cigarettes   Smokeless tobacco: Former    Quit date: 11/03/2014  Vaping Use   Vaping Use: Never used  Substance and Sexual Activity   Alcohol use: No    Alcohol/week: 0.0 standard drinks   Drug use: No   Sexual activity: Never  Other Topics Concern   Not on file  Social History Narrative   Not on file   Social Determinants of Health   Financial Resource Strain: Not on file  Food Insecurity: Not on file  Transportation Needs: Not on file  Physical Activity: Not on file  Stress: Not on file  Social Connections: Not on file  Intimate Partner Violence: Not on file     (Not in a hospital admission)   Review of Systems  Cardiovascular:  Positive for chest pain (Currently resolved). Negative for dyspnea on exertion, leg swelling, palpitations and syncope.     Physical Exam: Physical Exam Vitals and nursing note reviewed.  Constitutional:      General: He is not in acute distress. Neck:     Vascular: No JVD.  Cardiovascular:     Rate and Rhythm: Normal rate and regular rhythm.     Pulses:          Femoral pulses are 3+ on the right side and 3+ on the left side.      Popliteal pulses are 2+ on the right side and 2+ on the left side.       Dorsalis pedis pulses are 0 on the right side and 0 on the left side.       Posterior tibial pulses are 0 on the right side and 0 on the left side.     Heart sounds: Normal heart sounds. No murmur heard. Pulmonary:     Effort: Pulmonary effort is normal.     Breath sounds: Normal breath sounds. No wheezing or rales.  Musculoskeletal:     Right lower leg: No edema.      Left lower leg: No edema.       Imaging/tests reviewed and independently interpreted: Lab Results: Trop HS CBC, BMP CXR  Cardiac Studies:  Telemetry 09/21/2021: Intermittent NSVT up to 8 beats  EKG 09/20/2021: Serial EKGs reviewed Initial EKG with sinus rhythm, old anteroseptal infarct, anterolateral ST depression, which subsequently resolved  Echocardiogram 09/21/2021:  1. Left ventricular ejection fraction, by estimation, is 40 to 45%. The  left ventricle has normal function. The left ventricle demonstrates  regional wall motion abnormalities (see scoring diagram/findings for  description). Left ventricular diastolic  parameters are consistent with Grade I diastolic dysfunction (impaired  relaxation). There is moderate hypokinesis of the left ventricular,  mid-apical anteroseptal wall, inferolateral wall and apical segment.   2. Right  ventricular systolic function is normal. The right ventricular  size is normal. There is mildly elevated pulmonary artery systolic  pressure 40 mmHg.   3. The mitral valve is normal in structure. No evidence of mitral valve  regurgitation. No evidence of mitral stenosis.   4. Tricuspid valve regurgitation is mild to moderate.   5. The aortic valve is calcified. Aortic valve regurgitation is mild. No  aortic stenosis is present.    Assessment & Recommendations:  [name]  with hypertension, 14 PY smoker, h/o possible NSAID induced gastric and duodenal ulcerss, diverticulosis, no recent GI bleeding, presented with chest pain altered mental status.  Chest pain: NSTEMI Index event on 5/17 5 PM, another chest pain episode overnight.  Currently chest pain free. NSVT on monitor. Echocardiogram pending.  Schedule for cardiac catheterization, and possible angioplasty. We discussed regarding risks, benefits, alternatives to this including stress testing, CTA and continued medical therapy. Patient wants to proceed. Understands  <1-2% risk of death, stroke, MI, urgent CABG, bleeding, infection, renal failure but not limited to these.  No ongoing GI bleeding, although risk of GI bleeding is higher than general population. At this time, benefits of PCI outweigh risks, but need to be cautious regarding duration of DAPT. Ideally requires 1 year DAPT< but can be stopped sooner should GI bleeding occurs.  Hypertension: Chlorthalidone at home. Will add low dose beta blocker, if heart rate allows   Altered mental status: Possibly related to narcotic use. Now resolved  Left hand weakness. Right hand is only functional hand. Will perform procedure through left radial access.  Discussed interpretation of tests and management recommendations with the primary team     Nigel Mormon, MD Pager: [phone] Office: [phone]

## 2021-09-21 NOTE — Plan of Care (Signed)
  Problem: Cardiovascular: Goal: Ability to achieve and maintain adequate cardiovascular perfusion will improve Outcome: Progressing Goal: Vascular access site(s) Level 0-1 will be maintained Outcome: Progressing   

## 2021-09-21 NOTE — Progress Notes (Signed)
Pt had 12 beat run of Vtach. Pt is asymptomatic, VS stable. Dr Virgina Jock paged about pt. New orders received to give pt a dose of Potassium 40 Meq PO at this time.

## 2021-09-21 NOTE — Assessment & Plan Note (Signed)
 #)   Chronic diastolic heart failure: documented history of such, with most recent echocardiogram performed in January 2017 notable for LVEF 60 to 65% as well as grade 2 diastolic dysfunction. No clinical or radiographic evidence to suggest acutely decompensated heart failure at this time. home diuretic regimen reportedly consists of the following: Chlorthalidone in the absence of any loop diuretics.  In the setting of clinical evidence to suggest presenting euvolemia, as well as presenting NSTEMI with potential right-sided involvement given less than 1 mm ST depression in inferior leads, will hold home chlorthalidone for now.   Plan: monitor strict I's & O's and daily weights. Repeat BMP in AM. Check serum mag level.  Hold home chlorthalidone for now, as above.  Add on BNP.  Further evaluation management of presenting NSTEMI, as above, including echocardiogram.

## 2021-09-21 NOTE — Assessment & Plan Note (Signed)
 #)   Chronic iron deficiency anemia: Documented history of such, associate with baseline hemoglobin range of 10-12, with presenting hemoglobin found to be consistent with this range.  No evidence of active bleed at this time.  Notable past medical history includes history of peptic ulcer disease, with multiple previous episodes of GI bleeding.  Presenting BUN reassuring given its lack of elevation.  Patient appears hemodynamically stable.  Not on any blood thinners as an outpatient.  However, will closely monitor ensuing hemoglobin trend given plan for heparin drip in the setting of presenting NSTEMI, as further detailed above.  He is on Protonix as an outpatient.  Presenting INR non-elevated.   Plan: Continue Protonix.  Repeat CBC in the morning.

## 2021-09-22 ENCOUNTER — Other Ambulatory Visit (HOSPITAL_COMMUNITY): Payer: Self-pay

## 2021-09-22 ENCOUNTER — Encounter (HOSPITAL_COMMUNITY): Payer: Self-pay | Admitting: Cardiology

## 2021-09-22 ENCOUNTER — Telehealth: Payer: Self-pay

## 2021-09-22 LAB — ECHOCARDIOGRAM COMPLETE
Area-P 1/2: 4.17 cm2
Calc EF: 39.9 %
Height: 69 in
S' Lateral: 3.6 cm
Single Plane A2C EF: 37.8 %
Single Plane A4C EF: 39.9 %

## 2021-09-22 LAB — CBC
HCT: 37.5 % — ABNORMAL LOW (ref 39.0–52.0)
Hemoglobin: 13.1 g/dL (ref 13.0–17.0)
MCH: 30.7 pg (ref 26.0–34.0)
MCHC: 34.9 g/dL (ref 30.0–36.0)
MCV: 87.8 fL (ref 80.0–100.0)
Platelets: 293 10*3/uL (ref 150–400)
RBC: 4.27 MIL/uL (ref 4.22–5.81)
RDW: 18.1 % — ABNORMAL HIGH (ref 11.5–15.5)
WBC: 10.3 10*3/uL (ref 4.0–10.5)
nRBC: 0 % (ref 0.0–0.2)

## 2021-09-22 LAB — BASIC METABOLIC PANEL
Anion gap: 9 (ref 5–15)
BUN: 11 mg/dL (ref 8–23)
CO2: 25 mmol/L (ref 22–32)
Calcium: 8.9 mg/dL (ref 8.9–10.3)
Chloride: 103 mmol/L (ref 98–111)
Creatinine, Ser: 1.09 mg/dL (ref 0.61–1.24)
GFR, Estimated: >60 mL/min (ref >60)
Glucose, Bld: 106 mg/dL — ABNORMAL HIGH (ref 70–99)
Potassium: 3.6 mmol/L (ref 3.5–5.1)
Sodium: 137 mmol/L (ref 135–145)

## 2021-09-22 LAB — POCT ACTIVATED CLOTTING TIME: Activated Clotting Time: 317 seconds

## 2021-09-22 LAB — CALCIUM, IONIZED: Calcium, Ionized, Serum: 4.8 mg/dL (ref 4.5–5.6)

## 2021-09-22 MED ORDER — ATORVASTATIN CALCIUM 40 MG PO TABS
40.0000 mg | ORAL_TABLET | Freq: Every day | ORAL | 2 refills | Status: DC
  Filled 2021-09-22: qty 90, 90d supply, fill #0

## 2021-09-22 MED ORDER — METOPROLOL SUCCINATE ER 25 MG PO TB24
12.5000 mg | ORAL_TABLET | Freq: Every day | ORAL | 2 refills | Status: DC
  Filled 2021-09-22: qty 45, 90d supply, fill #0

## 2021-09-22 MED ORDER — METOPROLOL SUCCINATE ER 25 MG PO TB24
12.5000 mg | ORAL_TABLET | Freq: Every day | ORAL | Status: DC
Start: 2021-09-22 — End: 2021-09-23
  Administered 2021-09-22 – 2021-09-23 (×2): 12.5 mg via ORAL
  Filled 2021-09-22 (×2): qty 1

## 2021-09-22 MED ORDER — POTASSIUM CHLORIDE CRYS ER 20 MEQ PO TBCR
40.0000 meq | EXTENDED_RELEASE_TABLET | Freq: Once | ORAL | Status: AC
  Administered 2021-09-22: 40 meq via ORAL
  Filled 2021-09-22: qty 2

## 2021-09-22 MED ORDER — SACUBITRIL-VALSARTAN 24-26 MG PO TABS
1.0000 | ORAL_TABLET | Freq: Two times a day (BID) | ORAL | 2 refills | Status: DC
  Filled 2021-09-22: qty 60, 30d supply, fill #0

## 2021-09-22 MED ORDER — NITROGLYCERIN 0.4 MG SL SUBL
0.4000 mg | SUBLINGUAL_TABLET | SUBLINGUAL | 2 refills | Status: AC | PRN
Start: 2021-09-22 — End: ?
  Filled 2021-09-22: qty 25, 14d supply, fill #0

## 2021-09-22 MED ORDER — MAGNESIUM SULFATE 4 GM/100ML IV SOLN
4.0000 g | Freq: Once | INTRAVENOUS | Status: AC
  Administered 2021-09-22: 4 g via INTRAVENOUS
  Filled 2021-09-22: qty 100

## 2021-09-22 MED ORDER — NITROGLYCERIN 0.4 MG SL SUBL: 0.4000 mg | SUBLINGUAL_TABLET | SUBLINGUAL | Status: DC | PRN

## 2021-09-22 MED ORDER — ASPIRIN 81 MG PO TBEC: 81.0000 mg | DELAYED_RELEASE_TABLET | Freq: Every day | ORAL | 2 refills | Status: DC

## 2021-09-22 MED ORDER — SACUBITRIL-VALSARTAN 24-26 MG PO TABS
1.0000 | ORAL_TABLET | Freq: Two times a day (BID) | ORAL | Status: DC
  Administered 2021-09-22 – 2021-09-23 (×3): 1 via ORAL
  Filled 2021-09-22 (×3): qty 1

## 2021-09-22 MED ORDER — CLOPIDOGREL BISULFATE 75 MG PO TABS
75.0000 mg | ORAL_TABLET | Freq: Every day | ORAL | 2 refills | Status: AC
  Filled 2021-09-22: qty 90, 90d supply, fill #0

## 2021-09-22 MED FILL — Nitroglycerin IV Soln 100 MCG/ML in D5W: INTRA_ARTERIAL | Qty: 10 | Status: AC

## 2021-09-22 NOTE — Progress Notes (Signed)
PROGRESS NOTE        PATIENT DETAILS Name: Adrian Miller Age: 81 y.o. Sex: male Date of Birth: June 01, 1940 Admit Date: 09/20/2021 Admitting Physician Rhetta Mura, DO VQM:[id number], Arvid Right, MD  Brief Summary: Patient is a 81 y.o.  male with history of HTN, upper GI bleeding due to gastric ulcer-2022, lower GI bleeding due to diverticulosis February 2023-presented to the ED with some confusion-as he developed severe retrosternal chest pain/left neck pain for which she took narcotic and Benadryl.  He was subsequently found to have non-STEMI and admitted to the hospitalist service.   Significant events: 5/17>> admit to [facility]-non-STEMI-LHC 95% stenosis Ramus, 95% mid RCA-med management recommended.  Culprit vessel felt to be diagonal-very tortuous-not suitable for PCI.  Echo with EF 35-40% 5/29>>25 Beat run of NSVT  Significant studies: 5/17>> CXR: No PNA 5/17>> CT head: No acute intracranial abnormality. 5/17>> CT C-spine: Degenerative changes-no fracture. 5/17>> CT angio chest/abdomen/pelvis: No central PE-no evidence of dissection. 5/17>> Echo: EF 19-50%, grade 1 diastolic dysfunction.  Multiple wall motion abnormality.  Significant microbiology data: 5/17>> COVID/influenza PCR: Negative  Procedures: 5/19>>LHC: LM: Normal LAD: Tortuous. Rapid taper distally. No significant disease Lcx: Large, dominant, tortuous.        Distal 50% stenosis Ramus: Extreme tortuous takeoff. 95% stenosis RCA: Small, non-dominant. Mid 95% stenosis, best treated medically.   Consults: Cardiology  Subjective: No chest pain or shortness of breath.  Objective: Vitals: Blood pressure 121/67, pulse 78, temperature 98.2 F (36.8 C), temperature source Oral, resp. rate 16, height '5\' 9"'$  (1.753 m), weight 80.4 kg, SpO2 97 %.   Exam: Gen Exam:Alert awake-not in any distress HEENT:atraumatic, normocephalic Chest: B/L clear to auscultation anteriorly CVS:S1S2  regular Abdomen:soft non tender, non distended Extremities:no edema Neurology: Non focal Skin: no rash   Pertinent Labs/Radiology:    Latest Ref Rng & Units 09/22/2021    2:15 AM 09/21/2021    5:45 AM 09/21/2021    5:40 AM  CBC  WBC 4.0 - 10.5 K/uL 10.3    13.1    Hemoglobin 13.0 - 17.0 g/dL 13.1   14.6   13.6    Hematocrit 39.0 - 52.0 % 37.5   43.0   41.6    Platelets 150 - 400 K/uL 293    279      Lab Results  Component Value Date   NA 137 09/22/2021   K 3.6 09/22/2021   CL 103 09/22/2021   CO2 25 09/22/2021       Assessment/Plan: Non-STEMI: No further chest pain-LHC on 5/19-culprit vessel diagonal-not amenable to PCI-medical management recommended-on Plavix (Intolerant to aspirin)/statin/beta-blocker   Acute toxic encephalopathy: Due to combination of narcotics/Benadryl that the patient took for severe chest pain/neck pain.  Has resolved with supportive care.  CT head/C-spine negative for acute abnormalities.  Newly diagnosed HFrEF: Not in exacerbation-cardiology starting Entresto-already on beta-blocker.  Does not require diuretics at this point.  Suspect ischemic cardiomyopathy.  NSVT: Given K/Mg earlier today-on beta-blocker-cardiology will arrange for LifeVest.  HTN: BP relatively stable but on the higher side-starting beta-blocker.  Chronic HFpEF: Volume status stable.  History of upper GI bleeding with gastric ulcer 2022: Continue PPI  History of lower GI bleeding due to diverticulosis February 2023: No reoccurrence-hemoglobin stable   BMI: Estimated body mass index is 26.18 kg/m as calculated from the following:   Height as  of this encounter: '5\' 9"'$  (1.753 m).   Weight as of this encounter: 80.4 kg.   Code status:   Code Status: Full Code   DVT Prophylaxis: IV heparin heparin injection 5,000 Units Start: 09/22/21 0600 SCD's Start: 09/21/21 1525 Family Communication: None at bedside   Disposition Plan: Status is: Inpatient Remains inpatient  appropriate because: NSVT today-cardiology recommending 1 additional day of monitoring.   Planned Discharge Destination:Home   Diet: Diet Order             Diet Heart Room service appropriate? Yes; Fluid consistency: Thin  Diet effective now                     Antimicrobial agents: Anti-infectives (From admission, onward)    None        MEDICATIONS: Scheduled Meds:  atorvastatin  40 mg Oral Daily   clopidogrel  75 mg Oral Daily   ferrous sulfate  325 mg Oral BID WC   heparin  5,000 Units Subcutaneous Q8H   metoprolol succinate  12.5 mg Oral Daily   pantoprazole  40 mg Oral Daily   sacubitril-valsartan  1 tablet Oral BID   sodium chloride flush  3 mL Intravenous Q12H   Continuous Infusions:  sodium chloride 10 mL/hr at 09/22/21 0322   sodium chloride     PRN Meds:.sodium chloride, acetaminophen, naLOXone (NARCAN)  injection, nicotine, nitroGLYCERIN, ondansetron (ZOFRAN) IV, sodium chloride flush   I have personally reviewed following labs and imaging studies  LABORATORY DATA: CBC: Recent Labs  Lab 09/20/21 1825 09/21/21 0540 09/21/21 0545 09/22/21 0215  WBC 12.7* 13.1*  --  10.3  NEUTROABS 9.1* 10.4*  --   --   HGB 10.6* 13.6 14.6 13.1  HCT 31.6* 41.6 43.0 37.5*  MCV 91.6 90.8  --  87.8  PLT 237 279  --  293     Basic Metabolic Panel: Recent Labs  Lab 09/20/21 1825 09/21/21 0045 09/21/21 0540 09/21/21 0545 09/22/21 0215  NA 141  --  136 136 137  K 2.3*  --  3.7 4.0 3.6  CL 113*  --  102  --  103  CO2 22  --  24  --  25  GLUCOSE 108*  --  126*  --  106*  BUN 12  --  9  --  11  CREATININE 0.89  --  0.92  --  1.09  CALCIUM 6.7*  --  9.1  --  8.9  MG  --  1.7 1.8  --   --      GFR: Estimated Creatinine Clearance: 54.1 mL/min (by C-G formula based on SCr of 1.09 mg/dL).  Liver Function Tests: Recent Labs  Lab 09/20/21 1825 09/21/21 0540  AST 11* 93*  ALT 8 17  ALKPHOS 55 76  BILITOT 0.4 0.7  PROT 5.4* 7.6  ALBUMIN 2.5* 3.4*     No results for input(s): LIPASE, AMYLASE in the last 168 hours. No results for input(s): AMMONIA in the last 168 hours.  Coagulation Profile: Recent Labs  Lab 09/20/21 1825  INR 1.2     Cardiac Enzymes: No results for input(s): CKTOTAL, CKMB, CKMBINDEX, TROPONINI in the last 168 hours.  BNP (last 3 results) No results for input(s): PROBNP in the last 8760 hours.  Lipid Profile: Recent Labs    09/20/21 1825  CHOL 110  HDL 33*  LDLCALC 56  TRIG 106  CHOLHDL 3.3     Thyroid Function Tests: Recent Labs  09/21/21 0045  TSH 0.486     Anemia Panel: No results for input(s): VITAMINB12, FOLATE, FERRITIN, TIBC, IRON, RETICCTPCT in the last 72 hours.  Urine analysis:    Component Value Date/Time   COLORURINE YELLOW 09/21/2021 0001   APPEARANCEUR CLEAR 09/21/2021 0001   LABSPEC 1.035 (H) 09/21/2021 0001   PHURINE 6.0 09/21/2021 0001   GLUCOSEU NEGATIVE 09/21/2021 0001   GLUCOSEU NEGATIVE 04/25/2020 1511   HGBUR NEGATIVE 09/21/2021 0001   BILIRUBINUR NEGATIVE 09/21/2021 0001   BILIRUBINUR small 02/05/2012 1931   KETONESUR NEGATIVE 09/21/2021 0001   PROTEINUR NEGATIVE 09/21/2021 0001   UROBILINOGEN 1.0 04/25/2020 1511   NITRITE NEGATIVE 09/21/2021 0001   LEUKOCYTESUR NEGATIVE 09/21/2021 0001    Sepsis Labs: Lactic Acid, Venous    Component Value Date/Time   LATICACIDVEN 1.3 01/20/2021 0447    MICROBIOLOGY: Recent Results (from the past 240 hour(s))  Resp Panel by RT-PCR (Flu A&B, Covid) Nasopharyngeal Swab     Status: None   Collection Time: 09/20/21  6:31 PM   Specimen: Nasopharyngeal Swab; Nasopharyngeal(NP) swabs in vial transport medium  Result Value Ref Range Status   SARS Coronavirus 2 by RT PCR NEGATIVE NEGATIVE Final    Comment: (NOTE) SARS-CoV-2 target nucleic acids are NOT DETECTED.  The SARS-CoV-2 RNA is generally detectable in upper respiratory specimens during the acute phase of infection. The lowest concentration of SARS-CoV-2  viral copies this assay can detect is 138 copies/mL. A negative result does not preclude SARS-Cov-2 infection and should not be used as the sole basis for treatment or other patient management decisions. A negative result may occur with  improper specimen collection/handling, submission of specimen other than nasopharyngeal swab, presence of viral mutation(s) within the areas targeted by this assay, and inadequate number of viral copies(<138 copies/mL). A negative result must be combined with clinical observations, patient history, and epidemiological information. The expected result is Negative.  Fact Sheet for Patients:  EntrepreneurPulse.com.au  Fact Sheet for Healthcare Providers:  IncredibleEmployment.be  This test is no t yet approved or cleared by the [place] FDA and  has been authorized for detection and/or diagnosis of SARS-CoV-2 by FDA under an Emergency Use Authorization (EUA). This EUA will remain  in effect (meaning this test can be used) for the duration of the COVID-19 declaration under Section 564(b)(1) of the Act, 21 U.S.C.section 360bbb-3(b)(1), unless the authorization is terminated  or revoked sooner.       Influenza A by PCR NEGATIVE NEGATIVE Final   Influenza B by PCR NEGATIVE NEGATIVE Final    Comment: (NOTE) The Xpert Xpress SARS-CoV-2/FLU/RSV plus assay is intended as an aid in the diagnosis of influenza from Nasopharyngeal swab specimens and should not be used as a sole basis for treatment. Nasal washings and aspirates are unacceptable for Xpert Xpress SARS-CoV-2/FLU/RSV testing.  Fact Sheet for Patients: EntrepreneurPulse.com.au  Fact Sheet for Healthcare Providers: IncredibleEmployment.be  This test is not yet approved or cleared by the [place] FDA and has been authorized for detection and/or diagnosis of SARS-CoV-2 by FDA under an Emergency Use Authorization  (EUA). This EUA will remain in effect (meaning this test can be used) for the duration of the COVID-19 declaration under Section 564(b)(1) of the Act, 21 U.S.C. section 360bbb-3(b)(1), unless the authorization is terminated or revoked.  Performed at [facility], [place] [address] [place], [place] [address]     RADIOLOGY STUDIES/RESULTS: CT Head Wo Contrast  Result Date: 09/20/2021 CLINICAL DATA:  Near syncope, altered mental status EXAM:  CT HEAD WITHOUT CONTRAST CT CERVICAL SPINE WITHOUT CONTRAST TECHNIQUE: Multidetector CT imaging of the head and cervical spine was performed following the standard protocol without intravenous contrast. Multiplanar CT image reconstructions of the cervical spine were also generated. RADIATION DOSE REDUCTION: This exam was performed according to the departmental dose-optimization program which includes automated exposure control, adjustment of the mA and/or kV according to patient size and/or use of iterative reconstruction technique. COMPARISON:  01/20/2021 FINDINGS: CT HEAD FINDINGS Brain: No evidence of acute infarction, hemorrhage, hydrocephalus, extra-axial collection or mass lesion/mass effect. Subcortical white matter and periventricular small vessel ischemic changes. Mild chronic left hemispheric atrophy with prominent extra-axial CSF. Vascular: Mild intracranial atherosclerosis. Skull: Normal. Negative for fracture or focal lesion. Sinuses/Orbits: The visualized paranasal sinuses are essentially clear. The mastoid air cells are unopacified. Other: None. CT CERVICAL SPINE FINDINGS Alignment: Reversal of the normal cervical lordosis. Skull base and vertebrae: No acute fracture. No primary bone lesion or focal pathologic process. Soft tissues and spinal canal: No prevertebral fluid or swelling. No visible canal hematoma. Disc levels: Mild to moderate degenerative changes of the mid cervical spine. Spinal canal is patent. Upper chest: Evaluated on dedicated CTA  chest. Other: None. IMPRESSION: No evidence of acute intracranial abnormality. Atrophy with small vessel ischemic changes. No evidence of traumatic injury to the cervical spine. Mild to moderate degenerative changes. Electronically Signed   By: Julian Hy M.D.   On: 09/20/2021 21:27   CT Cervical Spine Wo Contrast  Result Date: 09/20/2021 CLINICAL DATA:  Near syncope, altered mental status EXAM: CT HEAD WITHOUT CONTRAST CT CERVICAL SPINE WITHOUT CONTRAST TECHNIQUE: Multidetector CT imaging of the head and cervical spine was performed following the standard protocol without intravenous contrast. Multiplanar CT image reconstructions of the cervical spine were also generated. RADIATION DOSE REDUCTION: This exam was performed according to the departmental dose-optimization program which includes automated exposure control, adjustment of the mA and/or kV according to patient size and/or use of iterative reconstruction technique. COMPARISON:  01/20/2021 FINDINGS: CT HEAD FINDINGS Brain: No evidence of acute infarction, hemorrhage, hydrocephalus, extra-axial collection or mass lesion/mass effect. Subcortical white matter and periventricular small vessel ischemic changes. Mild chronic left hemispheric atrophy with prominent extra-axial CSF. Vascular: Mild intracranial atherosclerosis. Skull: Normal. Negative for fracture or focal lesion. Sinuses/Orbits: The visualized paranasal sinuses are essentially clear. The mastoid air cells are unopacified. Other: None. CT CERVICAL SPINE FINDINGS Alignment: Reversal of the normal cervical lordosis. Skull base and vertebrae: No acute fracture. No primary bone lesion or focal pathologic process. Soft tissues and spinal canal: No prevertebral fluid or swelling. No visible canal hematoma. Disc levels: Mild to moderate degenerative changes of the mid cervical spine. Spinal canal is patent. Upper chest: Evaluated on dedicated CTA chest. Other: None. IMPRESSION: No evidence of  acute intracranial abnormality. Atrophy with small vessel ischemic changes. No evidence of traumatic injury to the cervical spine. Mild to moderate degenerative changes. Electronically Signed   By: Julian Hy M.D.   On: 09/20/2021 21:27   CARDIAC CATHETERIZATION  Result Date: 09/21/2021 Images from the original result were not included. LM: Normal LAD: Tortuous. Rapid taper distally. No significant disease Lcx: Large, dominant, tortuous.        Distal 50% stenosis Ramus: Extreme tortuous takeoff. 95% stenosis RCA: Small, non-dominant. Mid 95% stenosis, best treated medically. Attempted to wire the vessel but low chance of successful intervention due to extreme tortuosity. Aborted attempt after 5 min. No complication noted. Recommend medical management. Nigel Mormon, MD Pager:  [phone] Office: [phone]  DG Chest Port 1 View  Result Date: 09/20/2021 CLINICAL DATA:  Chest pain. EXAM: PORTABLE CHEST 1 VIEW COMPARISON:  June 10, 2021. FINDINGS: The heart size and mediastinal contours are within normal limits. Both lungs are clear. Status post right shoulder arthroplasty. IMPRESSION: No active disease. Electronically Signed   By: Marijo Conception M.D.   On: 09/20/2021 18:45   ECHOCARDIOGRAM COMPLETE  Result Date: 09/22/2021    ECHOCARDIOGRAM REPORT   Patient Name:   Sarah AAHAN MARQUES Date of Exam: 09/21/2021 Medical Rec #:  [id number]     Height:       69.0 in Accession #:    [id number]    Weight:       180.0 lb Date of Birth:  [birth year]      BSA:          1.976 m Patient Age:    46 years      BP:           143/86 mmHg Patient Gender: M             HR:           56 bpm. Exam Location:  Inpatient Procedure: 2D Echo, Cardiac Doppler, Color Doppler and Intracardiac            Opacification Agent                                 MODIFIED REPORT: This report was modified by Vernell Leep MD on 09/22/2021 due to Correction.  Indications:     122-I22.9 Subsequent ST elevation (STEM) and non-ST  elevation                  (NSTEMI) myocardial infarction  History:         Patient has prior history of Echocardiogram examinations, most                  recent 06/01/2015. CHF, Acute MI, Abnormal ECG,                  Arrythmias:Bradycardia; Risk Factors:Hypertension and Current                  Smoker.  Sonographer:     Roseanna Rainbow RDCS Referring Phys:  [id number] Rhetta Mura Diagnosing Phys: Vernell Leep MD  Sonographer Comments: Technically difficult study due to poor echo windows, suboptimal parasternal window and suboptimal apical window. Image acquisition challenging due to patient body habitus. Attempted to turn, very low parasternal. IMPRESSIONS  1. Left ventricular ejection fraction, by estimation, is 35 to 40%. The left ventricle has normal function. The left ventricle demonstrates regional wall motion abnormalities (see scoring diagram/findings for description). Left ventricular diastolic parameters are consistent with Grade I diastolic dysfunction (impaired relaxation). There is moderate hypokinesis of the left ventricular, mid-apical anteroseptal wall, inferolateral wall and apical segment.  2. Right ventricular systolic function is normal. The right ventricular size is normal. There is mildly elevated pulmonary artery systolic pressure 40 mmHg.  3. The mitral valve is normal in structure. No evidence of mitral valve regurgitation. No evidence of mitral stenosis.  4. Tricuspid valve regurgitation is mild to moderate.  5. The aortic valve is calcified. Aortic valve regurgitation is mild. No aortic stenosis is present. FINDINGS  Left Ventricle: Left ventricular ejection fraction, by estimation, is 35 to 40%. The left ventricle has normal function. The left  ventricle demonstrates regional wall motion abnormalities. Moderate hypokinesis of the left ventricular, mid-apical anteroseptal wall, inferolateral wall and apical segment. Definity contrast agent was given IV to delineate the left ventricular  endocardial borders. The left ventricular internal cavity size was normal in size. There is no left ventricular hypertrophy. Left ventricular diastolic parameters are consistent with Grade I diastolic dysfunction (impaired relaxation). Normal left ventricular filling pressure. Right Ventricle: The right ventricular size is normal. No increase in right ventricular wall thickness. Right ventricular systolic function is normal. There is mildly elevated pulmonary artery systolic pressure. The tricuspid regurgitant velocity is 3.03  m/s, and with an assumed right atrial pressure of 3 mmHg, the estimated right ventricular systolic pressure is 67.6 mmHg. Left Atrium: Left atrial size was normal in size. Right Atrium: Right atrial size was normal in size. Pericardium: There is no evidence of pericardial effusion. Mitral Valve: The mitral valve is normal in structure. No evidence of mitral valve regurgitation. No evidence of mitral valve stenosis. Tricuspid Valve: The tricuspid valve is normal in structure. Tricuspid valve regurgitation is mild to moderate. Aortic Valve: The aortic valve is calcified. Aortic valve regurgitation is mild. No aortic stenosis is present. Pulmonic Valve: The pulmonic valve was normal in structure. Pulmonic valve regurgitation is not visualized. No evidence of pulmonic stenosis. Aorta: The aortic root is normal in size and structure. IAS/Shunts: The interatrial septum was not assessed.  LEFT VENTRICLE PLAX 2D LVIDd:         4.80 cm      Diastology LVIDs:         3.60 cm      LV e' medial:    5.98 cm/s LV PW:         1.00 cm      LV E/e' medial:  11.3 LV IVS:        1.00 cm      LV e' lateral:   6.31 cm/s LVOT diam:     2.60 cm      LV E/e' lateral: 10.7 LV SV:         83 LV SV Index:   42 LVOT Area:     5.31 cm  LV Volumes (MOD) LV vol d, MOD A2C: 99.4 ml LV vol d, MOD A4C: 114.0 ml LV vol s, MOD A2C: 61.8 ml LV vol s, MOD A4C: 68.5 ml LV SV MOD A2C:     37.6 ml LV SV MOD A4C:     114.0 ml LV SV  MOD BP:      45.8 ml RIGHT VENTRICLE             IVC RV S prime:     14.70 cm/s  IVC diam: 1.90 cm TAPSE (M-mode): 2.4 cm LEFT ATRIUM             Index        RIGHT ATRIUM           Index LA diam:        3.40 cm 1.72 cm/m   RA Area:     13.50 cm LA Vol (A2C):   33.0 ml 16.70 ml/m  RA Volume:   32.70 ml  16.55 ml/m LA Vol (A4C):   26.4 ml 13.36 ml/m LA Biplane Vol: 32.2 ml 16.30 ml/m  AORTIC VALVE LVOT Vmax:   79.60 cm/s LVOT Vmean:  49.900 cm/s LVOT VTI:    0.157 m  AORTA Ao Root diam: 3.40 cm MITRAL VALVE  TRICUSPID VALVE MV Area (PHT): 4.17 cm    TR Peak grad:   36.7 mmHg MV Decel Time: 182 msec    TR Vmax:        303.00 cm/s MV E velocity: 67.70 cm/s MV A velocity: 85.70 cm/s  SHUNTS MV E/A ratio:  0.79        Systemic VTI:  0.16 m                            Systemic Diam: 2.60 cm Vernell Leep MD Electronically signed by Vernell Leep MD Signature Date/Time: 09/21/2021/11:53:08 AM    Final (Updated)    CT Angio Chest/Abd/Pel for Dissection W and/or W/WO  Result Date: 09/20/2021 CLINICAL DATA:  Right chest pain, evaluate for dissection EXAM: CT ANGIOGRAPHY CHEST, ABDOMEN AND PELVIS TECHNIQUE: Non-contrast CT of the chest was initially obtained. Multidetector CT imaging through the chest, abdomen and pelvis was performed using the standard protocol during bolus administration of intravenous contrast. Multiplanar reconstructed images and MIPs were obtained and reviewed to evaluate the vascular anatomy. RADIATION DOSE REDUCTION: This exam was performed according to the departmental dose-optimization program which includes automated exposure control, adjustment of the mA and/or kV according to patient size and/or use of iterative reconstruction technique. CONTRAST:  175m OMNIPAQUE IOHEXOL 350 MG/ML SOLN COMPARISON:  CT abdomen/pelvis dated 06/10/2021. CT chest dated 01/20/2021. FINDINGS: CTA CHEST FINDINGS Cardiovascular: On unenhanced CT, there is no evidence of intramural hematoma.  Following contrast administration, there is preferential opacification of the thoracic aorta. No evidence of thoracic aortic aneurysm or dissection. Atherosclerotic calcifications of the aortic arch. Study is not tailored for evaluation of the pulmonary arteries. No evidence of central pulmonary embolism. Heart is normal in size. No pericardial effusion. Mild coronary atherosclerosis of the LAD. Mediastinum/Nodes: No suspicious mediastinal lymphadenopathy. Visualized thyroid is unremarkable. Lungs/Pleura: Moderate centrilobular and paraseptal emphysematous changes, upper lung predominant. No suspicious pulmonary nodules. Minimal bibasilar atelectasis. No focal consolidation. No pleural effusion or pneumothorax. Musculoskeletal: Right shoulder arthroplasty. Degenerative changes of the thoracic spine. No fracture is seen. Review of the MIP images confirms the above findings. CTA ABDOMEN AND PELVIS FINDINGS VASCULAR Aorta: No evidence abdominal aortic aneurysm or dissection. Atherosclerotic calcifications. Patent. Celiac: Patent. SMA: Patent. Atherosclerotic calcifications. Renals: Patent bilaterally. Atherosclerotic calcifications at the origin of the left renal artery. IMA: Patent. Inflow: Patent bilaterally. Atherosclerotic calcifications. Veins: Unremarkable. Review of the MIP images confirms the above findings. NON-VASCULAR Hepatobiliary: Liver is within normal limits. Status post cholecystectomy. No intrahepatic or extrahepatic ductal dilatation. Pancreas: Within normal limits. Spleen: Within normal limits. Adrenals/Urinary Tract: Adrenal glands are within normal limits. Bilateral renal cysts, measuring up to 6.1 cm in the anterior left lower kidney (series 7/image 26). No hydronephrosis. Bladder is within normal limits. Stomach/Bowel: Stomach is within normal limits. No evidence of bowel obstruction. Normal appendix (series 7/image 198). Extensive sigmoid diverticulosis, without evidence of diverticulitis.  Lymphatic: No suspicious abdominopelvic lymphadenopathy. Reproductive: Mild prostatomegaly. Other: No abdominopelvic ascites. Musculoskeletal: Degenerative changes of the lumbar spine. Review of the MIP images confirms the above findings. IMPRESSION: No evidence of thoracoabdominal aortic aneurysm or dissection. No evidence of acute cardiopulmonary disease. Extensive left colonic diverticulosis, without evidence of diverticulitis. Additional ancillary findings as above. Electronically Signed   By: SJulian HyM.D.   On: 09/20/2021 21:37     LOS: 2 days   SOren Binet MD  [facility]    To contact the attending provider between 7A-7P or the  covering provider during after hours 7P-7A, please log into the web site [email or web address] and access using universal [place] password for that web site. If you do not have the password, please call the hospital operator.  09/22/2021, 3:31 PM

## 2021-09-22 NOTE — Progress Notes (Addendum)
Subjective:  Feels okay No chest pain  Overnight episodes of NSVT up top 25 beats. No associated chest pain, tingling numbness in arms noted  Objective:  Vital Signs in the last 24 hours: Temp:  [97.6 F (36.4 C)-99.7 F (37.6 C)] 98.2 F (36.8 C) (05/19 0742) Pulse Rate:  [35-78] 78 (05/19 0833) Resp:  [12-29] 16 (05/19 0742) BP: (108-156)/(63-93) 121/67 (05/19 0821) SpO2:  [90 %-100 %] 97 % (05/19 0742) Weight:  [80.4 kg-92.1 kg] 80.4 kg (05/19 0406)  Intake/Output from previous day: 05/18 0701 - 05/19 0700 In: 2824.1 [P.O.:720; I.V.:2004.1; IV Piggyback:100] Out: 951 [Urine:951]  Physical Exam Vitals and nursing note reviewed.  Constitutional:      General: He is not in acute distress. Neck:     Vascular: No JVD.  Cardiovascular:     Rate and Rhythm: Normal rate and regular rhythm.     Pulses:          Femoral pulses are 3+ on the right side and 3+ on the left side.      Popliteal pulses are 2+ on the right side and 2+ on the left side.       Dorsalis pedis pulses are 0 on the right side and 0 on the left side.       Posterior tibial pulses are 0 on the right side and 0 on the left side.     Heart sounds: Normal heart sounds. No murmur heard. Pulmonary:     Effort: Pulmonary effort is normal.     Breath sounds: Normal breath sounds. No wheezing or rales.  Musculoskeletal:     Right lower leg: No edema.     Left lower leg: No edema.    Cardiac Studies:  Telemetry 09/22/2021: NSVT episodes up to 25 beats  EKG 09/22/2021: Sinus rhythm Anterolateral T wave inversion, consider ischemia  Echocardiogram 09/21/2021:  1. Left ventricular ejection fraction, by estimation, is 40 to 45%. The  left ventricle has normal function. The left ventricle demonstrates  regional wall motion abnormalities (see scoring diagram/findings for  description). Left ventricular diastolic  parameters are consistent with Grade I diastolic dysfunction (impaired  relaxation). There is  moderate hypokinesis of the left ventricular,  mid-apical anteroseptal wall, inferolateral wall and apical segment.   2. Right ventricular systolic function is normal. The right ventricular  size is normal. There is mildly elevated pulmonary artery systolic  pressure 40 mmHg.   3. The mitral valve is normal in structure. No evidence of mitral valve  regurgitation. No evidence of mitral stenosis.   4. Tricuspid valve regurgitation is mild to moderate.   5. The aortic valve is calcified. Aortic valve regurgitation is mild. No  aortic stenosis is present.   Coronary angiography 09/21/2021: LM: Normal LAD: Tortuous. Rapid taper distally. No significant disease Lcx: Large, dominant, tortuous.        Distal 50% stenosis Ramus: Extreme tortuous takeoff. 95% stenosis RCA: Small, non-dominant. Mid 95% stenosis, best treated medically.    Attempted to wire the vessel but low chance of successful intervention due to extreme tortuosity. Aborted attempt after 5 min. No complication noted. Recommend medical management.   Assessment & Recommendations:  [name]  with hypertension, 43 PY smoker, h/o possible NSAID induced gastric and duodenal ulcerss, diverticulosis, no recent GI bleeding, presented with chest pain altered mental status.   Chest pain: NSTEMI Trop HS 16k Culprit vessel high diag. Very tortuous and thus not suitable for PCI Also has severe stenosis in  small nondominant RCA EF 35-40%.. NSVT up to 25 beats on 5/19 AM  Recommend DAPT with Aspirin and plavix for 1 year, but can be stopped sooner should there be any concern for GI bleed. Added Entresto 24-26 mg bid, metoprolol succinate 12.5 mg daily. K and MG replacement to keep K around 4, Mg around 2. Recommend Lifevest at up to 3 months given NSVT  Recommend at least one more night hospital stay for monitoring of NSVT   Discussed interpretation of tests and management recommendations with the primary  team   Nigel Mormon, MD Pager: [phone] Office: [phone]
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
## 2021-09-22 NOTE — TOC Progression Note (Addendum)
Transition of Care [facility]) - Progression Note    Patient Details  Name: Adrian Miller MRN: [id number] Date of Birth: Oct 14, 1940  Transition of [facility]) CM/SW Contact  Zenon Mayo, RN Phone Number: 09/22/2021, 2:49 PM  Clinical Narrative:    NCM faxed echo results to [phone], and informed Chrys Racer.   Per Dr. Virgina Jock he will cancel the lifevest because he states the EF is not 35 it is 35 to 40 this does not meet criteria for life vest.   NCM informed daughter Alveda Reasons  and the patient, that insurance will not cover the lifevest because he does not meet the criteria.   NCM spoke with Levada Dy with [facility] they will be taking the referral for Home health for patient.  HHRN, HHPT, HHOT.  Soc will begin 24 to 48 hrs post dc.    Expected Discharge Plan: [place] Barriers to Discharge: Continued Medical Work up  Expected Discharge Plan and Services Expected Discharge Plan: [place]   Discharge Planning Services: CM Consult Post Acute Care Choice: [place] arrangements for the past 2 months: Single Family Home                   DME Agency: NA       HH Arranged: PT, OT, Disease Management, RN [place] Agency: [place] Date HH Agency Contacted: 09/22/21 Time [place]: 1211 Representative spoke with at [place]: Levada Dy   Social Determinants of Health (SDOH) Interventions    Readmission Risk Interventions     View : No data to display.

## 2021-09-22 NOTE — Telephone Encounter (Signed)
Spoke with them.

## 2021-09-22 NOTE — Care Management (Signed)
0954 09-22-21 Information submitted for the Life Vest. Case Manager did call Chrys Racer the Liaison to make her aware. Will await insurance authorization.

## 2021-09-22 NOTE — Evaluation (Signed)
Occupational Therapy Evaluation Patient Details Name: Adrian Miller MRN: [id number] DOB: Apr 26, 1941 Today's Date: 09/22/2021   History of Present Illness 81 yo male admitted 5/17 with AMS and NSTEMI s/p radial access heart cath 5/18. pMhx: HTN, peptic ulcer disease, anemia, CHF, bil TKA, Lt RCR, Rt TSA   Clinical Impression   Prior to this admission, patient living alone and managing his ADLs and IADLs. Currently, patient presenting with decreased activity tolerance, poor balance, poor insight into his deficits and condition, and impaired cognition. 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 Daughter present for session, and appeared unphased by patient's presentation, but OT did not outright ask if this was patient's baseline. Daughter and family will be providing 24/7 assist upon initial return home with OT in agreement. Patient currently min A for ADLS and transfers, but limited session due to poor balance (falling backwards into chair) and wanting to eat lunch. OT recommending HHOT at discharge, OT will continue to follow acutely.      Recommendations for follow up therapy are one component of a multi-disciplinary discharge planning process, led by the attending physician.  Recommendations may be updated based on patient status, additional functional criteria and insurance authorization.   Follow Up Recommendations  Home health OT    Assistance Recommended at Discharge Frequent or constant Supervision/Assistance  Patient can return home with the following A little help with walking and/or transfers;A little help with bathing/dressing/bathroom;Assistance with cooking/housework;Direct supervision/assist for medications management;Direct supervision/assist for financial management;Assist for transportation;Help with stairs or ramp for entrance    Functional Status Assessment  Patient has had a  recent decline in their functional status and demonstrates the ability to make significant improvements in function in a reasonable and predictable amount of time.  Equipment Recommendations  None recommended by OT (Will continue to assess)    Recommendations for Other Services       Precautions / Restrictions Precautions Precautions: Fall;Other (comment) Precaution Comments: pt reports history of knee buckling. LUE radial cath 5/18 Restrictions Weight Bearing Restrictions: Yes Other Position/Activity Restrictions: LUE radial cath 5/18      Mobility Bed Mobility Overal bed mobility: Needs Assistance Bed Mobility: Rolling           General bed mobility comments: Patient in prone upon arrival, rolling out of bed and placing feet and knees on floor, boosting up into sitting back on the bed by transitioning from kneeling into standing at min gaurd assist level    Transfers Overall transfer level: Needs assistance Equipment used: Rolling walker (2 wheels) Transfers: Sit to/from Stand Sit to Stand: Min assist           General transfer comment: cues for hand placement and safety x 1 (max cues to not push through LUE) falling backwards into chair after transfer      Balance Overall balance assessment: Needs assistance   Sitting balance-Leahy Scale: Fair Sitting balance - Comments: static sitting without UE use   Standing balance support: Bilateral upper extremity supported Standing balance-Leahy Scale: Poor Standing balance comment: RW for standing and gait                           ADL either performed or assessed with clinical judgement   ADL Overall ADL's : Needs assistance/impaired Eating/Feeding: Set up;Sitting   Grooming: Set up;Sitting   Upper Body Bathing: Set up;Sitting   Lower Body  Bathing: Minimal assistance;Sitting/lateral leans   Upper Body Dressing : Set up;Sitting   Lower Body Dressing: Minimal assistance;Sitting/lateral leans    Toilet Transfer: Minimal assistance;Ambulation;Regular Toilet;Rolling walker (2 wheels)   Toileting- Clothing Manipulation and Hygiene: Min guard;Sitting/lateral lean       Functional mobility during ADLs: Minimal assistance;Rolling walker (2 wheels);Cueing for sequencing;Cueing for safety General ADL Comments: Patient presenting with decreased cognition, tangential speech, and poor insight into deficits     Vision         Perception     Praxis      Pertinent Vitals/Pain Pain Assessment Pain Assessment: No/denies pain     Hand Dominance     Extremity/Trunk Assessment Upper Extremity Assessment Upper Extremity Assessment: Generalized weakness;LUE deficits/detail LUE Deficits / Details: Median nerve impairment, full thenar eminence wasting, poor grip stregth, but 3/5 strength throughout LUE Sensation: decreased light touch;history of peripheral neuropathy LUE Coordination: decreased fine motor;decreased gross motor   Lower Extremity Assessment Lower Extremity Assessment: Defer to PT evaluation   Cervical / Trunk Assessment Cervical / Trunk Assessment: Kyphotic   Communication Communication Communication: No difficulties   Cognition Arousal/Alertness: Awake/alert Behavior During Therapy: WFL for tasks assessed/performed Overall Cognitive Status: Impaired/Different from baseline Area of Impairment: Following commands, Safety/judgement, Awareness, Attention                   Current Attention Level: Sustained   Following Commands: Follows one step commands with increased time, Follows multi-step commands inconsistently Safety/Judgement: Decreased awareness of deficits, Decreased awareness of safety Awareness: Intellectual   General Comments: Patient more appropriate with OT than with PT but daughter present in room, daughter seemed unphased by his tangential speech. 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, highways, and buildings in  [place] expects to be discharged to:: Private residence Living Arrangements: Alone Available Help at Discharge: Family;Available PRN/intermittently Type of Home: House Home Access: Stairs to enter Entrance Stairs-Number of Steps: 1   Home Layout: Two level;Able to live on main level with bedroom/bathroom     Bathroom Shower/Tub: Teacher, early years/pre: Standard     Home Equipment: Conservation officer, nature (2 wheels);Cane - single point;Rollator (4 wheels)          Prior Functioning/Environment Prior Level of Function : Independent/Modified Independent             Mobility Comments: RW at times, ADLs Comments: pt states he rides the cart in the grocery store        OT Problem List: Decreased strength;Decreased range of motion;Decreased activity tolerance;Impaired balance (sitting and/or standing);Decreased coordination;Decreased safety awareness;Decreased cognition;Decreased knowledge of use of DME or AE;Decreased knowledge of precautions;Impaired sensation;Impaired UE functional use      OT Treatment/Interventions: Therapeutic exercise;Energy conservation;Self-care/ADL training;DME and/or AE instruction;Manual therapy;Splinting;Therapeutic activities;Cognitive remediation/compensation;Patient/family education;Balance training    OT Goals(Current goals can be found in the care plan section) Acute Rehab OT Goals Patient Stated Goal: to get back home OT Goal Formulation: With patient/family Time For Goal Achievement: 10/06/21 Potential to Achieve Goals: Good ADL Goals Pt Will Perform Lower Body Bathing: Independently;sit to/from stand;sitting/lateral leans Pt Will Perform Lower Body Dressing: Independently;sitting/lateral leans;sit to/from stand Pt Will Transfer to Toilet: Independently;ambulating Additional ADL Goal #1: Patient will be able to follow multi-step  commands (3-4 steps) for a functional task without need for re-direction  or cues in order to follow. Additional ADL Goal #2: Patient will be able to complete pillbox test with no errors in order to safely manage medication at home.  OT Frequency: Min 2X/week    Co-evaluation              AM-PAC OT "6 Clicks" Daily Activity     Outcome Measure Help from another person eating meals?: A Little Help from another person taking care of personal grooming?: A Little Help from another person toileting, which includes using toliet, bedpan, or urinal?: A Little Help from another person bathing (including washing, rinsing, drying)?: A Little Help from another person to put on and taking off regular upper body clothing?: A Little Help from another person to put on and taking off regular lower body clothing?: A Little 6 Click Score: 18   End of Session Equipment Utilized During Treatment: Rolling walker (2 wheels);Gait belt Nurse Communication: Mobility status;Other (comment) (no box in room for chair alarm)  Activity Tolerance: Patient tolerated treatment well Patient left: in chair;with call bell/phone within reach;with family/visitor present  OT Visit Diagnosis: Unsteadiness on feet (R26.81);Other abnormalities of gait and mobility (R26.89);Muscle weakness (generalized) (M62.81);Other symptoms and signs involving cognitive function                Time: 5361-4431 OT Time Calculation (min): 13 min Charges:  OT General Charges $OT Visit: 1 Visit OT Evaluation $OT Eval Moderate Complexity: 1 Mod  Corinne Ports E. Velisa Regnier, OTR/L Acute Rehabilitation Services [phone] [place] 09/22/2021, 3:11 PM

## 2021-09-22 NOTE — Progress Notes (Signed)
Pt had 24 beat run of Vtach. Pt was found asleep with no complaints at this time. VS stable no s/s. Dr. Virgina Jock notified and new order received to give pt Magnesium 4 g IVPB. Will continue to monitor pt closely.

## 2021-09-22 NOTE — Progress Notes (Signed)
Heart Failure Navigator Progress Note  Assessed for Heart & Vascular TOC clinic readiness.  Patient does not meet criteria due to [facility] patient..     Adrian Miller, BSN, RN Heart Failure Nurse Navigator Secure Chat Only   

## 2021-09-22 NOTE — TOC Initial Note (Signed)
Transition of Care [facility]) - Initial/Assessment Note    Patient Details  Name: Adrian Miller MRN: [id number] Date of Birth: March 07, 1941  Transition of Care [facility] CM/SW Contact:    Bethena Roys, RN Phone Number: 09/22/2021, 12:11 PM  Clinical Narrative:  Life Vest information submitted to [facility]. PTA patient was from home with family support. Patient in need of home health services. Medicare.gov list provided and choice obtained. Referral submitted to [facility] with [facility] and they can accept the patient. Start of care to begin within 24-48 hours post transition home.                 Expected Discharge Plan: [place] Barriers to Discharge: Continued Medical Work up   Patient Goals and CMS Choice Patient states their goals for this hospitalization and ongoing recovery are:: to return home CMS Medicare.gov Compare Post Acute Care list provided to:: Patient Choice offered to / list presented to : Patient  Expected Discharge Plan and Services Expected Discharge Plan: [place]   Discharge Planning Services: CM Consult Post Acute Care Choice: [place] arrangements for the past 2 months: Single Family Home                   DME Agency: NA       HH Arranged: PT, OT, Disease Management, RN [place] Agency: [place] Date [facility] Agency Contacted: 09/22/21 Time HH Agency Contacted: 1211 Representative spoke with at [place]: Levada Dy  Prior Living Arrangements/Services Living arrangements for the past 2 months: [place] with:: Self (Children checks in on the patient.) Patient language and need for interpreter reviewed:: Yes Do you feel safe going back to the place where you live?: Yes      Need for Family Participation in Patient Care: Yes (Comment) Care giver support system in place?: Yes (comment) Current home services: DME (Patient has cane and rolling walker.) Criminal Activity/Legal Involvement Pertinent  to Current Situation/Hospitalization: No - Comment as needed   Permission Sought/Granted Permission sought to share information with : Family Supports, Customer service manager, Case Optician, dispensing granted to share information with : Yes, Verbal Permission Granted        Emotional Assessment Appearance:: Appears stated age Attitude/Demeanor/Rapport: Engaged Affect (typically observed): Appropriate Orientation: : Oriented to Self, Oriented to Place, Oriented to  Time, Oriented to Situation Alcohol / Substance Use: Not Applicable Psych Involvement: No (comment)  Admission diagnosis:  Precordial pain [R07.2] Hypokalemia [E87.6] NSTEMI (non-ST elevated myocardial infarction) ([place]) [I21.4] Patient Active Problem List   Diagnosis Date Noted   Hypocalcemia 09/21/2021   Acute encephalopathy 09/21/2021   Chronic iron deficiency anemia 09/21/2021   Chronic diastolic CHF (congestive heart failure) ([place]) 09/21/2021   NSTEMI (non-ST elevated myocardial infarction) ([place]) 09/20/2021   Tobacco use 06/11/2021   Hypokalemia 05/20/2020   Multiple gastric ulcers    Multiple duodenal ulcers    Bilateral recurrent inguinal hernia without obstruction or gangrene 10/22/2019   PAD (peripheral artery disease) ([place]) 01/22/2018   Long-term current use of opiate analgesic 10/17/2017   CKD (chronic kidney disease), stage II 09/29/2017   Encounter for long-term use of opiate analgesic 08/09/2017   Bradycardia 11/26/2016   Left lumbar radiculopathy 01/18/2016   Left carpal tunnel syndrome 12/28/2015   Hyperlipidemia with target LDL less than 130 12/26/2015   GERD (gastroesophageal reflux disease) 12/19/2015   Tobacco abuse counseling 12/19/2015   Prediabetes 06/14/2015   Generalized OA 06/14/2015  B12 deficiency anemia 06/14/2015   Other iron deficiency anemias 06/14/2015   Peptic stricture of esophagus 06/01/2015   Leukocytosis 05/31/2015   Benign essential HTN 05/31/2015   PCP:   Janith Lima, MD Pharmacy:   [id number]: [phone]: [phone]  Readmission Risk Interventions     View : No data to display.

## 2021-09-22 NOTE — Telephone Encounter (Signed)
Adrian Miller : [employer] is not going to approve life vest because the EF is 35-40, but it has to say only 35%. She is asking if you can make a note saying you believe EF is 35% so that she can summit it for approval. She would like for you to contact her on her cell.  [phone]

## 2021-09-22 NOTE — Progress Notes (Signed)
Seen pt from [phone], for education. Pt daughter was in attendance. Went over MI book, precautions/restrictions, diet, Angina/NTG use. Gave pt resources to watch LifeVest and MI video. Offered ambulation but pt declined due to walking earlier with PT and reporting knee buckling.Did not give EX guidelines due to instability with PT. Referred pt to [facility] in [place].

## 2021-09-22 NOTE — TOC Benefit Eligibility Note (Signed)
Patient Research scientist (life sciences) completed.     The patient is currently admitted and upon discharge could be taking JARDIANCE 10 MG.   The current 30 day co-pay is, $47.   The patient is currently admitted and upon discharge could be taking FARXIGA 10 MG.   The current 30 day co-pay is, $47.   The patient is currently admitted and upon discharge could be taking ENTRESTO 24-26 MG.   The current 30 day co-pay is, $47.   The patient is insured through [employer].

## 2021-09-23 LAB — MAGNESIUM: Magnesium: 1.9 mg/dL (ref 1.7–2.4)

## 2021-09-23 LAB — BASIC METABOLIC PANEL
Anion gap: 8 (ref 5–15)
BUN: 15 mg/dL (ref 8–23)
CO2: 22 mmol/L (ref 22–32)
Calcium: 9 mg/dL (ref 8.9–10.3)
Chloride: 106 mmol/L (ref 98–111)
Creatinine, Ser: 1.2 mg/dL (ref 0.61–1.24)
GFR, Estimated: >60 mL/min (ref >60)
Glucose, Bld: 96 mg/dL (ref 70–99)
Potassium: 3.9 mmol/L (ref 3.5–5.1)
Sodium: 136 mmol/L (ref 135–145)

## 2021-09-23 LAB — LIPOPROTEIN A (LPA): Lipoprotein (a): 22.4 nmol/L (ref <75.0)

## 2021-09-23 MED ORDER — MAGNESIUM SULFATE IN D5W 1-5 GM/100ML-% IV SOLN
1.0000 g | Freq: Once | INTRAVENOUS | Status: AC
  Administered 2021-09-23: 1 g via INTRAVENOUS
  Filled 2021-09-23: qty 100

## 2021-09-23 MED ORDER — POTASSIUM CHLORIDE CRYS ER 20 MEQ PO TBCR
20.0000 meq | EXTENDED_RELEASE_TABLET | Freq: Once | ORAL | Status: AC
  Administered 2021-09-23: 20 meq via ORAL
  Filled 2021-09-23: qty 1

## 2021-09-23 NOTE — Plan of Care (Signed)
  Problem: Cardiovascular: Goal: Ability to achieve and maintain adequate cardiovascular perfusion will improve Outcome: Progressing Goal: Vascular access site(s) Level 0-1 will be maintained Outcome: Progressing   

## 2021-09-23 NOTE — Progress Notes (Signed)
Subjective:  Doing well.  No complaints. No NSVT overnight  Objective:  Vital Signs in the last 24 hours: Temp:  [98.4 F (36.9 C)-99 F (37.2 C)] 99 F (37.2 C) (05/20 0432) Pulse Rate:  [63-74] 63 (05/20 0820) Resp:  [17-18] 17 (05/20 0432) BP: (103-107)/(64-65) 107/65 (05/20 0432) SpO2:  [96 %-97 %] 97 % (05/20 0432) Weight:  [80.9 kg] 80.9 kg (05/20 0432)  Intake/Output from previous day: 05/19 0701 - 05/20 0700 In: 384 [P.O.:360; I.V.:24] Out: 240 [Urine:240]  Physical Exam Vitals and nursing note reviewed.  Constitutional:      General: He is not in acute distress. Neck:     Vascular: No JVD.  Cardiovascular:     Rate and Rhythm: Normal rate and regular rhythm.     Pulses:          Femoral pulses are 3+ on the right side and 3+ on the left side.      Popliteal pulses are 2+ on the right side and 2+ on the left side.       Dorsalis pedis pulses are 0 on the right side and 0 on the left side.       Posterior tibial pulses are 0 on the right side and 0 on the left side.     Heart sounds: Normal heart sounds. No murmur heard. Pulmonary:     Effort: Pulmonary effort is normal.     Breath sounds: Normal breath sounds. No wheezing or rales.  Musculoskeletal:     Right lower leg: No edema.     Left lower leg: No edema.    Cardiac Studies:  Telemetry 09/22/2021: NSVT episodes up to 25 beats  EKG 09/22/2021: Sinus rhythm Anterolateral T wave inversion, consider ischemia  Echocardiogram 09/21/2021:  1. Left ventricular ejection fraction, by estimation, is 40 to 45%. The  left ventricle has normal function. The left ventricle demonstrates  regional wall motion abnormalities (see scoring diagram/findings for  description). Left ventricular diastolic  parameters are consistent with Grade I diastolic dysfunction (impaired  relaxation). There is moderate hypokinesis of the left ventricular,  mid-apical anteroseptal wall, inferolateral wall and apical segment.   2. Right  ventricular systolic function is normal. The right ventricular  size is normal. There is mildly elevated pulmonary artery systolic  pressure 40 mmHg.   3. The mitral valve is normal in structure. No evidence of mitral valve  regurgitation. No evidence of mitral stenosis.   4. Tricuspid valve regurgitation is mild to moderate.   5. The aortic valve is calcified. Aortic valve regurgitation is mild. No  aortic stenosis is present.   Coronary angiography 09/21/2021: LM: Normal LAD: Tortuous. Rapid taper distally. No significant disease Lcx: Large, dominant, tortuous.        Distal 50% stenosis Ramus: Extreme tortuous takeoff. 95% stenosis RCA: Small, non-dominant. Mid 95% stenosis, best treated medically.    Attempted to wire the vessel but low chance of successful intervention due to extreme tortuosity. Aborted attempt after 5 min. No complication noted. Recommend medical management.   Assessment & Recommendations:  [name]  with hypertension, 49 PY smoker, h/o possible NSAID induced gastric and duodenal ulcerss, diverticulosis, no recent GI bleeding, presented with chest pain altered mental status.   Chest pain: NSTEMI Trop HS 16k Culprit vessel high diag. Very tortuous and thus not suitable for PCI Also has severe stenosis in small nondominant RCA EF 35-40%.. NSVT up to 25 beats on 5/19 AM.  No recurrence since then.  Recommend DAPT with Aspirin and plavix for 1 year, but can be stopped sooner should there be any concern for GI bleed. Continue Entresto 24-26 mg bid, metoprolol succinate 12.5 mg daily. K and MG replacement to keep K around 4, Mg around 2. LifeVest not approved by insurance as EF is >35%.  Okay to discharge from cardiac standpoint, if no other barriers for discharge. Outpatient follow-up scheduled.   Discussed interpretation of tests and management recommendations with the primary team   Nigel Mormon, MD Pager:  [phone] Office: [phone]

## 2021-09-23 NOTE — Discharge Summary (Signed)
Adrian Miller IFO:[id number] DOB: 04/07/1941 DOA: 09/20/2021  PCP: Janith Lima, MD  Admit date: 09/20/2021  Discharge date: 09/23/2021  Admitted From: Home   Disposition:  Home   Recommendations for Outpatient Follow-up:   Follow up with PCP in 1-2 weeks  PCP Please obtain BMP/CBC, 2 view CXR in 1week,  (see Discharge instructions)   PCP Please follow up on the following pending results: Monitor BMP, magnesium closely.  Needs close outpatient cardiology follow-up.   Home Health: PT -OT if qualifies   Equipment/Devices: as below  Consultations: Cardiology Discharge Condition: Stable    CODE STATUS: Full    Diet Recommendation: Heart Healthy with 1.5 L fluid restriction per day.  Diet Order             Diet Heart Room service appropriate? Yes; Fluid consistency: Thin  Diet effective now                    Chief Complaint  Patient presents with   Chest Pain     Brief history of present illness from the day of admission and additional interim summary    81 y.o.  male with history of HTN, upper GI bleeding due to gastric ulcer-2022, lower GI bleeding due to diverticulosis February 2023-presented to the ED with some confusion-as he developed severe retrosternal chest pain/left neck pain for which she took narcotic and Benadryl.  He was subsequently found to have non-STEMI and admitted to the hospitalist service.    Significant events: 5/17>> admit to [facility]-non-STEMI-LHC 95% stenosis Ramus, 95% mid RCA-med management recommended.  Culprit vessel felt to be diagonal-very tortuous-not suitable for PCI.  Echo with EF 35-40% 5/29>>25 Beat run of NSVT   Significant studies: 5/17>> CXR: No PNA 5/17>> CT head: No acute intracranial abnormality. 5/17>> CT C-spine: Degenerative changes-no fracture. 5/17>> CT  angio chest/abdomen/pelvis: No central PE-no evidence of dissection. 5/17>> Echo: EF 67-67%, grade 1 diastolic dysfunction.  Multiple wall motion abnormality.   Significant microbiology data: 5/17>> COVID/influenza PCR: Negative   Procedures: 5/19>>LHC: LM: Normal LAD: Tortuous. Rapid taper distally. No significant disease Lcx: Large, dominant, tortuous.        Distal 50% stenosis Ramus: Extreme tortuous takeoff. 95% stenosis RCA: Small, non-dominant. Mid 95% stenosis, best treated medically.                                                                  Hospital Course     Non-STEMI: No further chest pain-LHC on 5/19-culprit vessel diagonal-not amenable to PCI-medical management recommended-on aspirin, Plavix/statin/beta-blocker, continue close outpatient follow-up with cardiology postdischarge.  Currently symptom-free.   Acute toxic encephalopathy: Due to combination of narcotics/Benadryl that the patient took for severe chest pain/neck pain.  Has resolved with supportive care.  CT head/C-spine negative for acute  abnormalities.   Newly diagnosed HFrEF of 35%: Not in exacerbation-cardiology starting Entresto-already on beta-blocker.  Does not require diuretics at this point.  Suspect ischemic cardiomyopathy.  PCP to monitor mag and potassium levels closely in the outpatient setting.   NSVT: Given K/Mg earlier today-on beta-blocker-cardiology right to arrange LifeVest but it was not approved by insurance company.  Currently symptom-free discharge home with outpatient cardiology follow-up, CHF medications as above   HTN: BP relatively stable, PCP to monitor on the outpatient basis   History of upper GI bleeding with gastric ulcer 2022: Continue PPI, currently on dual antiplatelet therapy, PCP to monitor closely.   History of lower GI bleeding due to diverticulosis February 2023: No reoccurrence-hemoglobin stable   Discharge diagnosis     Principal Problem:   NSTEMI (non-ST  elevated myocardial infarction) ([place]) Active Problems:   Leukocytosis   Benign essential HTN   Hypokalemia   Hypocalcemia   Acute encephalopathy   Chronic iron deficiency anemia   Chronic diastolic CHF (congestive heart failure) [facility])    Discharge instructions    Discharge Instructions     Amb Referral to Cardiac Rehabilitation   Complete by: As directed    Diagnosis: NSTEMI   After initial evaluation and assessments completed: Virtual Based Care may be provided alone or in conjunction with Phase 2 Cardiac Rehab based on patient barriers.: Yes   Discharge instructions   Complete by: As directed    Follow with Primary MD Janith Lima, MD in 7 days   Get CBC, CMP,Magnesium,  2 view Chest X ray -  checked next visit within 1 week by Primary MD    Activity: As tolerated with Full fall precautions use walker/cane & assistance as needed  Disposition Home    Diet: Heart Healthy 1.5 L fluid restriction per day.    Check your Weight same time everyday, if you gain over 2 pounds, or you develop in leg swelling, experience more shortness of breath or chest pain, call your Primary MD immediately. Follow Cardiac Low Salt Diet and 1.5 lit/day fluid restriction.  Special Instructions: If you have smoked or chewed Tobacco  in the last 2 yrs please stop smoking, stop any regular Alcohol  and or any Recreational drug use.  On your next visit with your primary care physician please Get Medicines reviewed and adjusted.  Please request your Prim.MD to go over all Hospital Tests and Procedure/Radiological results at the follow up, please get all Hospital records sent to your Prim MD by signing hospital release before you go home.  If you experience worsening of your admission symptoms, develop shortness of breath, life threatening emergency, suicidal or homicidal thoughts you must seek medical attention immediately by calling 911 or calling your MD immediately  if symptoms less severe.  You  Must read complete instructions/literature along with all the possible adverse reactions/side effects for all the Medicines you take and that have been prescribed to you. Take any new Medicines after you have completely understood and accpet all the possible adverse reactions/side effects.   Increase activity slowly   Complete by: As directed        Discharge Medications   Allergies as of 09/23/2021       Reactions   Asa [aspirin] Other (See Comments)   Caused rectal bleeding   [name] [aspirin-salicylamide-caffeine] Other (See Comments)   Caused rectal bleeding        Medication List     STOP taking these medications  chlorthalidone 25 MG tablet Commonly known as: HYGROTON       TAKE these medications    acetaminophen 325 MG tablet Commonly known as: TYLENOL Take 650 mg by mouth every 6 (six) hours as needed (pain).   aspirin EC 81 MG tablet Take 1 tablet (81 mg total) by mouth daily.   atorvastatin 40 MG tablet Commonly known as: LIPITOR Take 1 tablet (40 mg total) by mouth daily.   clopidogrel 75 MG tablet Commonly known as: PLAVIX Take 1 tablet (75 mg total) by mouth daily.   Iron 325 (65 Fe) MG Tabs Take 1 tablet (325 mg total) by mouth 2 (two) times daily. What changed: when to take this   metoprolol succinate 25 MG 24 hr tablet Commonly known as: TOPROL-XL Take 0.5 tablets (12.5 mg total) by mouth daily.   nitroGLYCERIN 0.4 MG SL tablet Commonly known as: NITROSTAT Place 1 tablet (0.4 mg total) under the tongue every 5 (five) minutes as needed for chest pain.   OVER THE COUNTER MEDICATION Take 1 tablet by mouth at bedtime as needed (frequent urination). Super B Prostate   oxyCODONE-acetaminophen 10-325 MG tablet Commonly known as: PERCOCET Take 1 tablet by mouth every 6 (six) hours as needed for pain.   pantoprazole 40 MG tablet Commonly known as: PROTONIX TAKE 1 TABLET BY MOUTH EVERY DAY What changed:  how to take this when  to take this   potassium chloride SA 20 MEQ tablet Commonly known as: KLOR-CON M Take 1 tablet (20 mEq total) by mouth daily.   sacubitril-valsartan 24-26 MG Commonly known as: ENTRESTO Take 1 tablet by mouth 2 (two) times daily.   VISINE OP Place 1 drop into both eyes daily as needed (blurry vision).               Durable Medical Equipment  (From admission, onward)           Start     Ordered   09/23/21 1009  For home use only DME Walker rolling  Once       Comments: 5 wheel  Question Answer Comment  Walker: With 5 Inch Wheels   Patient needs a walker to treat with the following condition Weakness      09/23/21 1008   09/22/21 0845  For home use only DME Vest life vest  Once       Comments: 3 months   09/22/21 0844             Follow-up Information     [place], Celeste C, PA-C Follow up on 10/06/2021.   Specialty: Cardiology Why: 1:00 PM Contact information: [place] [address] [place], [place] Follow up.   Specialty: Home Health Services Why: [place]: Registered Nurse and physical therapy-office to call with visit times. Contact information: [place] [address] [phone]         [place], [place] Follow up.   Specialty: Home Health Services Why: HHRN,HHPT, [place] will contact you to set up apt times. Contact information: [place] [address] [phone]         Nigel Mormon, MD. Schedule an appointment as soon as possible for a visit in 1 week(s).   Specialties: Cardiology, Radiology Contact information: [address] [place] [address] [phone]         Ronnald Ramp,  Arvid Right, MD. Schedule an appointment as soon as possible for a visit in 1 week(s).   Specialty: Internal Medicine Contact information: [place]  [address] [phone]                 Major procedures and Radiology Reports - PLEASE review detailed and final reports thoroughly  -       CT Head Wo Contrast  Result Date: 09/20/2021 CLINICAL DATA:  Near syncope, altered mental status EXAM: CT HEAD WITHOUT CONTRAST CT CERVICAL SPINE WITHOUT CONTRAST TECHNIQUE: Multidetector CT imaging of the head and cervical spine was performed following the standard protocol without intravenous contrast. Multiplanar CT image reconstructions of the cervical spine were also generated. RADIATION DOSE REDUCTION: This exam was performed according to the departmental dose-optimization program which includes automated exposure control, adjustment of the mA and/or kV according to patient size and/or use of iterative reconstruction technique. COMPARISON:  01/20/2021 FINDINGS: CT HEAD FINDINGS Brain: No evidence of acute infarction, hemorrhage, hydrocephalus, extra-axial collection or mass lesion/mass effect. Subcortical white matter and periventricular small vessel ischemic changes. Mild chronic left hemispheric atrophy with prominent extra-axial CSF. Vascular: Mild intracranial atherosclerosis. Skull: Normal. Negative for fracture or focal lesion. Sinuses/Orbits: The visualized paranasal sinuses are essentially clear. The mastoid air cells are unopacified. Other: None. CT CERVICAL SPINE FINDINGS Alignment: Reversal of the normal cervical lordosis. Skull base and vertebrae: No acute fracture. No primary bone lesion or focal pathologic process. Soft tissues and spinal canal: No prevertebral fluid or swelling. No visible canal hematoma. Disc levels: Mild to moderate degenerative changes of the mid cervical spine. Spinal canal is patent. Upper chest: Evaluated on dedicated CTA chest. Other: None. IMPRESSION: No evidence of acute intracranial abnormality. Atrophy with small vessel ischemic changes. No evidence of traumatic injury to the cervical spine. Mild to moderate  degenerative changes. Electronically Signed   By: Julian Hy M.D.   On: 09/20/2021 21:27   CT Cervical Spine Wo Contrast  Result Date: 09/20/2021 CLINICAL DATA:  Near syncope, altered mental status EXAM: CT HEAD WITHOUT CONTRAST CT CERVICAL SPINE WITHOUT CONTRAST TECHNIQUE: Multidetector CT imaging of the head and cervical spine was performed following the standard protocol without intravenous contrast. Multiplanar CT image reconstructions of the cervical spine were also generated. RADIATION DOSE REDUCTION: This exam was performed according to the departmental dose-optimization program which includes automated exposure control, adjustment of the mA and/or kV according to patient size and/or use of iterative reconstruction technique. COMPARISON:  01/20/2021 FINDINGS: CT HEAD FINDINGS Brain: No evidence of acute infarction, hemorrhage, hydrocephalus, extra-axial collection or mass lesion/mass effect. Subcortical white matter and periventricular small vessel ischemic changes. Mild chronic left hemispheric atrophy with prominent extra-axial CSF. Vascular: Mild intracranial atherosclerosis. Skull: Normal. Negative for fracture or focal lesion. Sinuses/Orbits: The visualized paranasal sinuses are essentially clear. The mastoid air cells are unopacified. Other: None. CT CERVICAL SPINE FINDINGS Alignment: Reversal of the normal cervical lordosis. Skull base and vertebrae: No acute fracture. No primary bone lesion or focal pathologic process. Soft tissues and spinal canal: No prevertebral fluid or swelling. No visible canal hematoma. Disc levels: Mild to moderate degenerative changes of the mid cervical spine. Spinal canal is patent. Upper chest: Evaluated on dedicated CTA chest. Other: None. IMPRESSION: No evidence of acute intracranial abnormality. Atrophy with small vessel ischemic changes. No evidence of traumatic injury to the cervical spine. Mild to moderate degenerative changes. Electronically Signed   By:  Julian Hy M.D.   On: 09/20/2021 21:27  CARDIAC CATHETERIZATION  Result Date: 09/21/2021 Images from the original result were not included. LM: Normal LAD: Tortuous. Rapid taper distally. No significant disease Lcx: Large, dominant, tortuous.        Distal 50% stenosis Ramus: Extreme tortuous takeoff. 95% stenosis RCA: Small, non-dominant. Mid 95% stenosis, best treated medically. Attempted to wire the vessel but low chance of successful intervention due to extreme tortuosity. Aborted attempt after 5 min. No complication noted. Recommend medical management. Nigel Mormon, MD Pager: [phone] Office: [phone]  DG Chest Port 1 View  Result Date: 09/20/2021 CLINICAL DATA:  Chest pain. EXAM: PORTABLE CHEST 1 VIEW COMPARISON:  June 10, 2021. FINDINGS: The heart size and mediastinal contours are within normal limits. Both lungs are clear. Status post right shoulder arthroplasty. IMPRESSION: No active disease. Electronically Signed   By: Marijo Conception M.D.   On: 09/20/2021 18:45   ECHOCARDIOGRAM COMPLETE  Result Date: 09/22/2021    ECHOCARDIOGRAM REPORT   Patient Name:   Loel TERRIS GERMANO Date of Exam: 09/21/2021 Medical Rec #:  [id number]     Height:       69.0 in Accession #:    [id number]    Weight:       180.0 lb Date of Birth:  [birth year]      BSA:          1.976 m Patient Age:    81 years      BP:           143/86 mmHg Patient Gender: M             HR:           56 bpm. Exam Location:  Inpatient Procedure: 2D Echo, Cardiac Doppler, Color Doppler and Intracardiac            Opacification Agent                                 MODIFIED REPORT: This report was modified by Vernell Leep MD on 09/22/2021 due to Correction.  Indications:     122-I22.9 Subsequent ST elevation (STEM) and non-ST elevation                  (NSTEMI) myocardial infarction  History:         Patient has prior history of Echocardiogram examinations, most                  recent 06/01/2015. CHF, Acute MI, Abnormal  ECG,                  Arrythmias:Bradycardia; Risk Factors:Hypertension and Current                  Smoker.  Sonographer:     Roseanna Rainbow RDCS Referring Phys:  [id number] Rhetta Mura Diagnosing Phys: Vernell Leep MD  Sonographer Comments: Technically difficult study due to poor echo windows, suboptimal parasternal window and suboptimal apical window. Image acquisition challenging due to patient body habitus. Attempted to turn, very low parasternal. IMPRESSIONS  1. Left ventricular ejection fraction, by estimation, is 35 to 40%. The left ventricle has normal function. The left ventricle demonstrates regional wall motion abnormalities (see scoring diagram/findings for description). Left ventricular diastolic parameters are consistent with Grade I diastolic dysfunction (impaired relaxation). There is moderate hypokinesis of the left ventricular, mid-apical anteroseptal wall, inferolateral wall and apical segment.  2. Right ventricular systolic function is normal.  The right ventricular size is normal. There is mildly elevated pulmonary artery systolic pressure 40 mmHg.  3. The mitral valve is normal in structure. No evidence of mitral valve regurgitation. No evidence of mitral stenosis.  4. Tricuspid valve regurgitation is mild to moderate.  5. The aortic valve is calcified. Aortic valve regurgitation is mild. No aortic stenosis is present. FINDINGS  Left Ventricle: Left ventricular ejection fraction, by estimation, is 35 to 40%. The left ventricle has normal function. The left ventricle demonstrates regional wall motion abnormalities. Moderate hypokinesis of the left ventricular, mid-apical anteroseptal wall, inferolateral wall and apical segment. Definity contrast agent was given IV to delineate the left ventricular endocardial borders. The left ventricular internal cavity size was normal in size. There is no left ventricular hypertrophy. Left ventricular diastolic parameters are consistent with Grade I  diastolic dysfunction (impaired relaxation). Normal left ventricular filling pressure. Right Ventricle: The right ventricular size is normal. No increase in right ventricular wall thickness. Right ventricular systolic function is normal. There is mildly elevated pulmonary artery systolic pressure. The tricuspid regurgitant velocity is 3.03  m/s, and with an assumed right atrial pressure of 3 mmHg, the estimated right ventricular systolic pressure is 33.8 mmHg. Left Atrium: Left atrial size was normal in size. Right Atrium: Right atrial size was normal in size. Pericardium: There is no evidence of pericardial effusion. Mitral Valve: The mitral valve is normal in structure. No evidence of mitral valve regurgitation. No evidence of mitral valve stenosis. Tricuspid Valve: The tricuspid valve is normal in structure. Tricuspid valve regurgitation is mild to moderate. Aortic Valve: The aortic valve is calcified. Aortic valve regurgitation is mild. No aortic stenosis is present. Pulmonic Valve: The pulmonic valve was normal in structure. Pulmonic valve regurgitation is not visualized. No evidence of pulmonic stenosis. Aorta: The aortic root is normal in size and structure. IAS/Shunts: The interatrial septum was not assessed.  LEFT VENTRICLE PLAX 2D LVIDd:         4.80 cm      Diastology LVIDs:         3.60 cm      LV e' medial:    5.98 cm/s LV PW:         1.00 cm      LV E/e' medial:  11.3 LV IVS:        1.00 cm      LV e' lateral:   6.31 cm/s LVOT diam:     2.60 cm      LV E/e' lateral: 10.7 LV SV:         83 LV SV Index:   42 LVOT Area:     5.31 cm  LV Volumes (MOD) LV vol d, MOD A2C: 99.4 ml LV vol d, MOD A4C: 114.0 ml LV vol s, MOD A2C: 61.8 ml LV vol s, MOD A4C: 68.5 ml LV SV MOD A2C:     37.6 ml LV SV MOD A4C:     114.0 ml LV SV MOD BP:      45.8 ml RIGHT VENTRICLE             IVC RV S prime:     14.70 cm/s  IVC diam: 1.90 cm TAPSE (M-mode): 2.4 cm LEFT ATRIUM             Index        RIGHT ATRIUM           Index LA  diam:        3.40  cm 1.72 cm/m   RA Area:     13.50 cm LA Vol (A2C):   33.0 ml 16.70 ml/m  RA Volume:   32.70 ml  16.55 ml/m LA Vol (A4C):   26.4 ml 13.36 ml/m LA Biplane Vol: 32.2 ml 16.30 ml/m  AORTIC VALVE LVOT Vmax:   79.60 cm/s LVOT Vmean:  49.900 cm/s LVOT VTI:    0.157 m  AORTA Ao Root diam: 3.40 cm MITRAL VALVE               TRICUSPID VALVE MV Area (PHT): 4.17 cm    TR Peak grad:   36.7 mmHg MV Decel Time: 182 msec    TR Vmax:        303.00 cm/s MV E velocity: 67.70 cm/s MV A velocity: 85.70 cm/s  SHUNTS MV E/A ratio:  0.79        Systemic VTI:  0.16 m                            Systemic Diam: 2.60 cm Vernell Leep MD Electronically signed by Vernell Leep MD Signature Date/Time: 09/21/2021/11:53:08 AM    Final (Updated)    CT Angio Chest/Abd/Pel for Dissection W and/or W/WO  Result Date: 09/20/2021 CLINICAL DATA:  Right chest pain, evaluate for dissection EXAM: CT ANGIOGRAPHY CHEST, ABDOMEN AND PELVIS TECHNIQUE: Non-contrast CT of the chest was initially obtained. Multidetector CT imaging through the chest, abdomen and pelvis was performed using the standard protocol during bolus administration of intravenous contrast. Multiplanar reconstructed images and MIPs were obtained and reviewed to evaluate the vascular anatomy. RADIATION DOSE REDUCTION: This exam was performed according to the departmental dose-optimization program which includes automated exposure control, adjustment of the mA and/or kV according to patient size and/or use of iterative reconstruction technique. CONTRAST:  141m OMNIPAQUE IOHEXOL 350 MG/ML SOLN COMPARISON:  CT abdomen/pelvis dated 06/10/2021. CT chest dated 01/20/2021. FINDINGS: CTA CHEST FINDINGS Cardiovascular: On unenhanced CT, there is no evidence of intramural hematoma. Following contrast administration, there is preferential opacification of the thoracic aorta. No evidence of thoracic aortic aneurysm or dissection. Atherosclerotic calcifications of the  aortic arch. Study is not tailored for evaluation of the pulmonary arteries. No evidence of central pulmonary embolism. Heart is normal in size. No pericardial effusion. Mild coronary atherosclerosis of the LAD. Mediastinum/Nodes: No suspicious mediastinal lymphadenopathy. Visualized thyroid is unremarkable. Lungs/Pleura: Moderate centrilobular and paraseptal emphysematous changes, upper lung predominant. No suspicious pulmonary nodules. Minimal bibasilar atelectasis. No focal consolidation. No pleural effusion or pneumothorax. Musculoskeletal: Right shoulder arthroplasty. Degenerative changes of the thoracic spine. No fracture is seen. Review of the MIP images confirms the above findings. CTA ABDOMEN AND PELVIS FINDINGS VASCULAR Aorta: No evidence abdominal aortic aneurysm or dissection. Atherosclerotic calcifications. Patent. Celiac: Patent. SMA: Patent. Atherosclerotic calcifications. Renals: Patent bilaterally. Atherosclerotic calcifications at the origin of the left renal artery. IMA: Patent. Inflow: Patent bilaterally. Atherosclerotic calcifications. Veins: Unremarkable. Review of the MIP images confirms the above findings. NON-VASCULAR Hepatobiliary: Liver is within normal limits. Status post cholecystectomy. No intrahepatic or extrahepatic ductal dilatation. Pancreas: Within normal limits. Spleen: Within normal limits. Adrenals/Urinary Tract: Adrenal glands are within normal limits. Bilateral renal cysts, measuring up to 6.1 cm in the anterior left lower kidney (series 7/image 26). No hydronephrosis. Bladder is within normal limits. Stomach/Bowel: Stomach is within normal limits. No evidence of bowel obstruction. Normal appendix (series 7/image 198). Extensive sigmoid diverticulosis, without evidence of diverticulitis. Lymphatic: No suspicious abdominopelvic lymphadenopathy. Reproductive:  Mild prostatomegaly. Other: No abdominopelvic ascites. Musculoskeletal: Degenerative changes of the lumbar spine. Review  of the MIP images confirms the above findings. IMPRESSION: No evidence of thoracoabdominal aortic aneurysm or dissection. No evidence of acute cardiopulmonary disease. Extensive left colonic diverticulosis, without evidence of diverticulitis. Additional ancillary findings as above. Electronically Signed   By: Julian Hy M.D.   On: 09/20/2021 21:37      Today   Subjective    [name] today has no headache,no chest abdominal pain,no new weakness tingling or numbness, feels much better wants to go home today.    Objective   Blood pressure 107/65, pulse 63, temperature 99 F (37.2 C), temperature source Oral, resp. rate 17, height '5\' 9"'$  (1.753 m), weight 80.9 kg, SpO2 97 %.   Intake/Output Summary (Last 24 hours) at 09/23/2021 1014 Last data filed at 09/23/2021 0600 Gross per 24 hour  Intake 384.03 ml  Output 240 ml  Net 144.03 ml    Exam  Awake Alert, No new F.N deficits,    [place].AT,PERRAL Supple Neck,   Symmetrical Chest wall movement, Good air movement bilaterally, CTAB RRR,No Gallops,   +ve B.Sounds, Abd Soft, Non tender,  No Cyanosis, Clubbing or edema    Data Review   CBC w Diff:  Lab Results  Component Value Date   WBC 10.3 09/22/2021   HGB 13.1 09/22/2021   HCT 37.5 (L) 09/22/2021   PLT 293 09/22/2021   LYMPHOPCT 12 09/21/2021   MONOPCT 8 09/21/2021   EOSPCT 0 09/21/2021   BASOPCT 0 09/21/2021    CMP:  Lab Results  Component Value Date   NA 136 09/23/2021   K 3.9 09/23/2021   CL 106 09/23/2021   CO2 22 09/23/2021   BUN 15 09/23/2021   CREATININE 1.20 09/23/2021   PROT 7.6 09/21/2021   ALBUMIN 3.4 (L) 09/21/2021   BILITOT 0.7 09/21/2021   ALKPHOS 76 09/21/2021   AST 93 (H) 09/21/2021   ALT 17 09/21/2021  .   Total Time in preparing paper work, data evaluation and todays exam - 28 minutes  Lala Lund M.D on 09/23/2021 at 10:14 AM  [facility]

## 2021-09-23 NOTE — Discharge Instructions (Signed)
Follow with Primary MD Janith Lima, MD in 7 days   Get CBC, CMP,Magnesium,  2 view Chest X ray -  checked next visit within 1 week by Primary MD    Activity: As tolerated with Full fall precautions use walker/cane & assistance as needed  Disposition Home    Diet: Heart Healthy 1.5 L fluid restriction per day.    Check your Weight same time everyday, if you gain over 2 pounds, or you develop in leg swelling, experience more shortness of breath or chest pain, call your Primary MD immediately. Follow Cardiac Low Salt Diet and 1.5 lit/day fluid restriction.  Special Instructions: If you have smoked or chewed Tobacco  in the last 2 yrs please stop smoking, stop any regular Alcohol  and or any Recreational drug use.  On your next visit with your primary care physician please Get Medicines reviewed and adjusted.  Please request your Prim.MD to go over all Hospital Tests and Procedure/Radiological results at the follow up, please get all Hospital records sent to your Prim MD by signing hospital release before you go home.  If you experience worsening of your admission symptoms, develop shortness of breath, life threatening emergency, suicidal or homicidal thoughts you must seek medical attention immediately by calling 911 or calling your MD immediately  if symptoms less severe.  You Must read complete instructions/literature along with all the possible adverse reactions/side effects for all the Medicines you take and that have been prescribed to you. Take any new Medicines after you have completely understood and accpet all the possible adverse reactions/side effects.

## 2021-09-25 ENCOUNTER — Telehealth: Payer: Self-pay

## 2021-09-25 NOTE — Telephone Encounter (Signed)
Location of hospitalization: [facility]. Reason for hospitalization: Chest pain Date of discharge: 09/23/2021 Date of first communication with patient: today Person contacting patient: Gaye Alken, CMA Current symptoms: none Do you understand why you were in the Hospital: Yes Questions regarding discharge instructions: None Where were you discharged to: Home Medications reviewed: Yes Allergies reviewed: Yes Dietary changes reviewed: Yes. Discussed low fat and low salt diet.  Referals reviewed: NA Activities of Daily Living: Able to with mild limitations Any transportation issues/concerns: None Any patient concerns: None Confirmed importance & date/time of Follow up appt: Yes Confirmed with patient if condition begins to worsen call. Pt was given the office number and encouraged to call back with questions or concerns: Yes

## 2021-09-25 NOTE — Telephone Encounter (Signed)
Transition Care Management Unsuccessful Follow-up Telephone Call  Date of discharge and from where:  [place] 09-23-21 Dx: NSTEMI  Attempts:  1st Attempt  Reason for unsuccessful TCM follow-up call:  Left voice message   Transition Care Management Follow-up Telephone Call Date of discharge and from where: [place] 09-23-21 Dx: NSTEMI How have you been since you were released from the hospital? Doing ok  Any questions or concerns? Daughter states that his neck Is hurting and is painful to touch-   Items Reviewed: Did the pt receive and understand the discharge instructions provided? Yes  Medications obtained and verified? Yes  Other? No  Any new allergies since your discharge? No  Dietary orders reviewed? Yes Do you have support at home? Yes   Home Care and Equipment/Supplies: Were home health services ordered? yes If so, what is the name of the agency?  [facility] set up a time to come to the patient's home? yes Were any new equipment or medical supplies ordered?  No What is the name of the medical supply agency? na Were you able to get the supplies/equipment? not applicable Do you have any questions related to the use of the equipment or supplies? No  Functional Questionnaire: (I = Independent and D = Dependent) ADLs: D  Bathing/Dressing- D  Meal Prep- D  Eating- I  Maintaining continence- I  Transferring/Ambulation- I  Managing Meds- D  Follow up appointments reviewed:  PCP Hospital f/u appt confirmed? Yes  [facility]  Scheduled to see Dr Rayetta Pigg on 10-06-21 @ 1pm. Are transportation arrangements needed? No  If their condition worsens, is the pt aware to call PCP or go to the Emergency Dept.? Yes Was the patient provided with contact information for the PCP's office or ED? Yes Was to pt encouraged to call back with questions or concerns?  Yes

## 2021-09-26 DIAGNOSIS — I5021 Acute systolic (congestive) heart failure: Secondary | ICD-10-CM | POA: Diagnosis not present

## 2021-09-26 DIAGNOSIS — I2510 Atherosclerotic heart disease of native coronary artery without angina pectoris: Secondary | ICD-10-CM | POA: Diagnosis not present

## 2021-09-26 DIAGNOSIS — N182 Chronic kidney disease, stage 2 (mild): Secondary | ICD-10-CM | POA: Diagnosis not present

## 2021-09-26 DIAGNOSIS — I5032 Chronic diastolic (congestive) heart failure: Secondary | ICD-10-CM | POA: Diagnosis not present

## 2021-09-26 DIAGNOSIS — I214 Non-ST elevation (NSTEMI) myocardial infarction: Secondary | ICD-10-CM | POA: Diagnosis not present

## 2021-09-26 DIAGNOSIS — I130 Hypertensive heart and chronic kidney disease with heart failure and stage 1 through stage 4 chronic kidney disease, or unspecified chronic kidney disease: Secondary | ICD-10-CM | POA: Diagnosis not present

## 2021-09-26 LAB — METHYLMALONIC ACID, SERUM: Methylmalonic Acid, Quantitative: 120 nmol/L (ref 0–378)

## 2021-09-27 ENCOUNTER — Other Ambulatory Visit (HOSPITAL_COMMUNITY): Payer: Self-pay

## 2021-09-28 ENCOUNTER — Ambulatory Visit: Payer: Medicare Other | Admitting: Nurse Practitioner

## 2021-09-29 ENCOUNTER — Telehealth: Payer: Self-pay | Admitting: Internal Medicine

## 2021-09-29 ENCOUNTER — Telehealth (HOSPITAL_COMMUNITY): Payer: Self-pay

## 2021-09-29 DIAGNOSIS — I5032 Chronic diastolic (congestive) heart failure: Secondary | ICD-10-CM | POA: Diagnosis not present

## 2021-09-29 DIAGNOSIS — I2510 Atherosclerotic heart disease of native coronary artery without angina pectoris: Secondary | ICD-10-CM | POA: Diagnosis not present

## 2021-09-29 DIAGNOSIS — N182 Chronic kidney disease, stage 2 (mild): Secondary | ICD-10-CM | POA: Diagnosis not present

## 2021-09-29 DIAGNOSIS — I214 Non-ST elevation (NSTEMI) myocardial infarction: Secondary | ICD-10-CM | POA: Diagnosis not present

## 2021-09-29 DIAGNOSIS — I5021 Acute systolic (congestive) heart failure: Secondary | ICD-10-CM | POA: Diagnosis not present

## 2021-09-29 DIAGNOSIS — I130 Hypertensive heart and chronic kidney disease with heart failure and stage 1 through stage 4 chronic kidney disease, or unspecified chronic kidney disease: Secondary | ICD-10-CM | POA: Diagnosis not present

## 2021-09-29 NOTE — Telephone Encounter (Signed)
Verbal orders given to [facility] to proeed with in-home PT.

## 2021-09-29 NOTE — Telephone Encounter (Signed)
Genevieve with [facility] is requesting verbal orders for home PT. Call back number is [phone]

## 2021-09-29 NOTE — Telephone Encounter (Signed)
Called and spoke with pt in regards to CR, pt stated he is not interested at this time.   Closed referral 

## 2021-09-29 NOTE — Telephone Encounter (Signed)
LVM for Genevieve to discuss verbal orders.

## 2021-10-03 ENCOUNTER — Telehealth: Payer: Self-pay | Admitting: Internal Medicine

## 2021-10-03 DIAGNOSIS — I130 Hypertensive heart and chronic kidney disease with heart failure and stage 1 through stage 4 chronic kidney disease, or unspecified chronic kidney disease: Secondary | ICD-10-CM | POA: Diagnosis not present

## 2021-10-03 DIAGNOSIS — I5032 Chronic diastolic (congestive) heart failure: Secondary | ICD-10-CM | POA: Diagnosis not present

## 2021-10-03 DIAGNOSIS — I5021 Acute systolic (congestive) heart failure: Secondary | ICD-10-CM | POA: Diagnosis not present

## 2021-10-03 DIAGNOSIS — N182 Chronic kidney disease, stage 2 (mild): Secondary | ICD-10-CM | POA: Diagnosis not present

## 2021-10-03 DIAGNOSIS — I2510 Atherosclerotic heart disease of native coronary artery without angina pectoris: Secondary | ICD-10-CM | POA: Diagnosis not present

## 2021-10-03 DIAGNOSIS — I214 Non-ST elevation (NSTEMI) myocardial infarction: Secondary | ICD-10-CM | POA: Diagnosis not present

## 2021-10-03 MED ORDER — METOPROLOL SUCCINATE ER 25 MG PO TB24: 12.5000 mg | ORAL_TABLET | Freq: Every day | ORAL | 2 refills | Status: DC

## 2021-10-03 NOTE — Telephone Encounter (Signed)
[facility]

## 2021-10-03 NOTE — Telephone Encounter (Signed)
Caregiver called in and states pt was rx metoprolol succinate (TOPROL-XL) 25 MG 24 hr tablet and refills were sent to [facility].   [facility] so that all of medications are in one place.

## 2021-10-04 DIAGNOSIS — I130 Hypertensive heart and chronic kidney disease with heart failure and stage 1 through stage 4 chronic kidney disease, or unspecified chronic kidney disease: Secondary | ICD-10-CM | POA: Diagnosis not present

## 2021-10-04 DIAGNOSIS — I5021 Acute systolic (congestive) heart failure: Secondary | ICD-10-CM | POA: Diagnosis not present

## 2021-10-04 DIAGNOSIS — I2510 Atherosclerotic heart disease of native coronary artery without angina pectoris: Secondary | ICD-10-CM | POA: Diagnosis not present

## 2021-10-04 DIAGNOSIS — I214 Non-ST elevation (NSTEMI) myocardial infarction: Secondary | ICD-10-CM | POA: Diagnosis not present

## 2021-10-04 DIAGNOSIS — I5032 Chronic diastolic (congestive) heart failure: Secondary | ICD-10-CM | POA: Diagnosis not present

## 2021-10-04 DIAGNOSIS — N182 Chronic kidney disease, stage 2 (mild): Secondary | ICD-10-CM | POA: Diagnosis not present

## 2021-10-05 NOTE — Progress Notes (Deleted)
Primary Physician/Referring:  Janith Lima, MD  Patient ID: Adrian Miller, male    DOB: [birth year], 81 y.o.   MRN: [id number]  No chief complaint on file.  HPI:    Adrian Miller  is a 81 y.o. 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  Patient presented to [facility] ED 09/20/2021 with chest pain and subsequently diagnosed with NSTEMI.   Patient subsequently underwent coronary angiography revealing culprit vessel high diagonal, which was very tortuous and therefore not suitable for PCI, patient also had severe stenosis of small nondominant RCA.  LVEF 35-40%.  Patient developed NSVT up to 25 beats on 09/22/2021 and was therefore recommended to be discharged home with LifeVest, however this was not covered by insurance unfortunately.  Patient was recommended medical management and discharged on aspirin and Plavix, atorvastatin, metoprolol succinate, and Entresto.  Patient now presents for follow up. ***  ***  Past Medical History:  Diagnosis Date   Acute esophagitis 06/01/2015   Arthritis    Benign essential HTN 05/31/2015   Duodenal ulcer hemorrhage    Esophageal stricture 06/01/2015   Gastric ulcer    Hiatal hernia 06/01/2015   Past Surgical History:  Procedure Laterality Date   BIOPSY  05/20/2020   Procedure: BIOPSY;  Surgeon: Lavena Bullion, DO;  Location: [facility];  Service: Gastroenterology;;   CHOLECYSTECTOMY N/A 09/30/2017   Procedure: LAPAROSCOPIC CHOLECYSTECTOMY WITH INTRAOPERATIVE CHOLANGIOGRAM;  Surgeon: Excell Seltzer, MD;  Location: [facility];  Service: General;  Laterality: N/A;   COLONOSCOPY     ESOPHAGOGASTRODUODENOSCOPY N/A 05/31/2015   Procedure: ESOPHAGOGASTRODUODENOSCOPY (EGD);  Surgeon: Ladene Artist, MD;  Location: [facility];  Service: Endoscopy;  Laterality: N/A;   ESOPHAGOGASTRODUODENOSCOPY (EGD) WITH PROPOFOL N/A 05/20/2020   Procedure: ESOPHAGOGASTRODUODENOSCOPY  (EGD) WITH PROPOFOL;  Surgeon: Lavena Bullion, DO;  Location: [facility];  Service: Gastroenterology;  Laterality: N/A;   HERNIA REPAIR  1975   LEFT HEART CATH AND CORONARY ANGIOGRAPHY N/A 09/21/2021   Procedure: LEFT HEART CATH AND CORONARY ANGIOGRAPHY;  Surgeon: Nigel Mormon, MD;  Location: [place] [facility];  Service: Cardiovascular;  Laterality: N/A;   LEFT ROTATOR CUFF REPAIR  2002   REPLACEMENT TOTAL KNEE Left    REVERSE SHOULDER ARTHROPLASTY Right 03/20/2018   Procedure: RIGHT REVERSE SHOULDER ARTHROPLASTY;  Surgeon: Tania Ade, MD;  Location: [place];  Service: Orthopedics;  Laterality: Right;   TOTAL KNEE ARTHROPLASTY Right 02/22/2015   Procedure: TOTAL KNEE ARTHROPLASTY;  Surgeon: Melrose Nakayama, MD;  Location: [place];  Service: Orthopedics;  Laterality: Right;   Family History  Problem Relation Age of Onset   Hypertension Mother    Heart attack Sister    Colon cancer Neg Hx    Esophageal cancer Neg Hx    Rectal cancer Neg Hx    Stomach cancer Neg Hx     Social History   Tobacco Use   Smoking status: Every Day    Packs/day: 0.50    Years: 51.00    Pack years: 25.50    Types: Cigarettes   Smokeless tobacco: Former    Quit date: 11/03/2014  Substance Use Topics   Alcohol use: No    Alcohol/week: 0.0 standard drinks   Marital Status: Single   ROS  ***ROS  Objective  There were no vitals taken for this visit.     09/23/2021    8:20 AM 09/23/2021    4:32 AM 09/22/2021  8:10 PM  Vitals with BMI  Weight  178 lbs 6 oz   BMI  24.40   Systolic  102 725  Diastolic  65 64  Pulse 63 74 65      ***Physical Exam Cardiovascular:     Pulses:          Femoral pulses are 3+ on the right side and 3+ on the left side.      Popliteal pulses are 2+ on the right side and 2+ on the left side.       Dorsalis pedis pulses are 0 on the right side and 0 on the left side.       Posterior tibial pulses are 0 on the right side and 0 on the left side.     Laboratory examination:   Recent Labs    09/21/21 0540 09/21/21 0545 09/22/21 0215 09/23/21 0134  NA 136 136 137 136  K 3.7 4.0 3.6 3.9  CL 102  --  103 106  CO2 24  --  25 22  GLUCOSE 126*  --  106* 96  BUN 9  --  11 15  CREATININE 0.92  --  1.09 1.20  CALCIUM 9.1  --  8.9 9.0  GFRNONAA >60  --  >60 >60   estimated creatinine clearance is 49.1 mL/min (by C-G formula based on SCr of 1.2 mg/dL).     Latest Ref Rng & Units 09/23/2021    1:34 AM 09/22/2021    2:15 AM 09/21/2021    5:45 AM  CMP  Glucose 70 - 99 mg/dL 96   106     BUN 8 - 23 mg/dL 15   11     Creatinine 0.61 - 1.24 mg/dL 1.20   1.09     Sodium 135 - 145 mmol/L 136   137   136    Potassium 3.5 - 5.1 mmol/L 3.9   3.6   4.0    Chloride 98 - 111 mmol/L 106   103     CO2 22 - 32 mmol/L 22   25     Calcium 8.9 - 10.3 mg/dL 9.0   8.9         Latest Ref Rng & Units 09/22/2021    2:15 AM 09/21/2021    5:45 AM 09/21/2021    5:40 AM  CBC  WBC 4.0 - 10.5 K/uL 10.3    13.1    Hemoglobin 13.0 - 17.0 g/dL 13.1   14.6   13.6    Hematocrit 39.0 - 52.0 % 37.5   43.0   41.6    Platelets 150 - 400 K/uL 293    279      Lipid Panel Recent Labs    09/20/21 1825  CHOL 110  TRIG 106  LDLCALC 56  VLDL 21  HDL 33*  CHOLHDL 3.3    HEMOGLOBIN A1C Lab Results  Component Value Date   HGBA1C 5.7 (H) 09/20/2021   MPG 116.89 09/20/2021   TSH Recent Labs    09/21/21 0045  TSH 0.486    External labs:  None   Allergies   Allergies  Allergen Reactions   Asa [Aspirin] Other (See Comments)    Caused rectal bleeding   [name] [Aspirin-Salicylamide-Caffeine] Other (See Comments)    Caused rectal bleeding    Medications Prior to Visit:   Outpatient Medications Prior to Visit  Medication Sig Dispense Refill   acetaminophen (TYLENOL) 325 MG tablet Take 650 mg by  mouth every 6 (six) hours as needed (pain).     aspirin EC 81 MG tablet Take 1 tablet (81 mg total) by mouth daily. 30 tablet 2    atorvastatin (LIPITOR) 40 MG tablet Take 1 tablet (40 mg total) by mouth daily. 30 tablet 2   clopidogrel (PLAVIX) 75 MG tablet Take 1 tablet (75 mg total) by mouth daily. 30 tablet 2   Ferrous Sulfate (IRON) 325 (65 Fe) MG TABS Take 1 tablet (325 mg total) by mouth 2 (two) times daily. (Patient taking differently: Take 325 mg by mouth every morning.) 180 tablet 1   metoprolol succinate (TOPROL-XL) 25 MG 24 hr tablet Take 0.5 tablets (12.5 mg total) by mouth daily. 30 tablet 2   nitroGLYCERIN (NITROSTAT) 0.4 MG SL tablet Place 1 tablet (0.4 mg total) under the tongue every 5 (five) minutes as needed for chest pain. 30 tablet 2   OVER THE COUNTER MEDICATION Take 1 tablet by mouth at bedtime as needed (frequent urination). Super B Prostate     oxyCODONE-acetaminophen (PERCOCET) 10-325 MG tablet Take 1 tablet by mouth every 6 (six) hours as needed for pain. 100 tablet 0   pantoprazole (PROTONIX) 40 MG tablet TAKE 1 TABLET BY MOUTH EVERY DAY (Patient taking differently: 40 mg every morning.) 90 tablet 0   potassium chloride SA (KLOR-CON) 20 MEQ tablet Take 1 tablet (20 mEq total) by mouth daily. (Patient not taking: Reported on 09/26/2021) 30 tablet 3   sacubitril-valsartan (ENTRESTO) 24-26 MG Take 1 tablet by mouth 2 (two) times daily. 60 tablet 2   Tetrahydrozoline HCl (VISINE OP) Place 1 drop into both eyes daily as needed (blurry vision).     No facility-administered medications prior to visit.   Final Medications at End of Visit    No outpatient medications have been marked as taking for the 10/06/21 encounter (Appointment) with Rayetta Pigg, Brysyn Brandenberger C, PA-C.   Radiology:   No results found.  Cardiac Studies:   Echocardiogram 09/21/2021:  1. Left ventricular ejection fraction, by estimation, is 40 to 45%. The  left ventricle has normal function. The left ventricle demonstrates  regional wall motion abnormalities (see scoring diagram/findings for  description). Left ventricular diastolic   parameters are consistent with Grade I diastolic dysfunction (impaired  relaxation). There is moderate hypokinesis of the left ventricular,  mid-apical anteroseptal wall, inferolateral wall and apical segment.   2. Right ventricular systolic function is normal. The right ventricular  size is normal. There is mildly elevated pulmonary artery systolic  pressure 40 mmHg.   3. The mitral valve is normal in structure. No evidence of mitral valve  regurgitation. No evidence of mitral stenosis.   4. Tricuspid valve regurgitation is mild to moderate.   5. The aortic valve is calcified. Aortic valve regurgitation is mild. No  aortic stenosis is present.    Coronary angiography 09/21/2021: LM: Normal LAD: Tortuous. Rapid taper distally. No significant disease Lcx: Large, dominant, tortuous.        Distal 50% stenosis Ramus: Extreme tortuous takeoff. 95% stenosis RCA: Small, non-dominant. Mid 95% stenosis, best treated medically.    Attempted to wire the vessel but low chance of successful intervention due to extreme tortuosity. Aborted attempt after 5 min. No complication noted. Recommend medical management.  EKG:   ***  09/22/2021: Sinus rhythm. Anterolateral T wave inversion, consider ischemia  Assessment  No diagnosis found.   There are no discontinued medications.  No orders of the defined types were placed in this encounter.  Recommendations:   Adrian Miller is a 81 y.o. AA male with history of  hypertension, 68 PY smoker, h/o possible NSAID induced gastric and duodenal ulcerss, diverticulosis.  Patient with CAD per coronary angiography 09/21/2021.  ***  Patient was seen in collaboration with Dr. [name] He also reviewed patient's chart and examined the patient. Dr. [name] is in agreement of the plan.   During this visit I reviewed and updated: Tobacco history  allergies medication reconciliation  medical history  surgical history  family history  social history.  This note  was created using a voice recognition software as a result there may be grammatical errors inadvertently enclosed that do not reflect the nature of this encounter. Every attempt is made to correct such errors.   Alethia Berthold, PA-C 10/05/2021, 10:24 AM Office: [phone]

## 2021-10-06 ENCOUNTER — Ambulatory Visit: Payer: Medicare Other | Admitting: Student

## 2021-10-06 ENCOUNTER — Ambulatory Visit: Payer: Medicare Other | Admitting: Cardiology

## 2021-10-06 DIAGNOSIS — I5021 Acute systolic (congestive) heart failure: Secondary | ICD-10-CM | POA: Diagnosis not present

## 2021-10-06 DIAGNOSIS — I5032 Chronic diastolic (congestive) heart failure: Secondary | ICD-10-CM | POA: Diagnosis not present

## 2021-10-06 DIAGNOSIS — I214 Non-ST elevation (NSTEMI) myocardial infarction: Secondary | ICD-10-CM | POA: Diagnosis not present

## 2021-10-06 DIAGNOSIS — N182 Chronic kidney disease, stage 2 (mild): Secondary | ICD-10-CM | POA: Diagnosis not present

## 2021-10-06 DIAGNOSIS — I2510 Atherosclerotic heart disease of native coronary artery without angina pectoris: Secondary | ICD-10-CM | POA: Diagnosis not present

## 2021-10-06 DIAGNOSIS — I130 Hypertensive heart and chronic kidney disease with heart failure and stage 1 through stage 4 chronic kidney disease, or unspecified chronic kidney disease: Secondary | ICD-10-CM | POA: Diagnosis not present

## 2021-10-06 DIAGNOSIS — I10 Essential (primary) hypertension: Secondary | ICD-10-CM

## 2021-10-06 DIAGNOSIS — I4729 Other ventricular tachycardia: Secondary | ICD-10-CM

## 2021-10-06 DIAGNOSIS — I5020 Unspecified systolic (congestive) heart failure: Secondary | ICD-10-CM

## 2021-10-07 DIAGNOSIS — I130 Hypertensive heart and chronic kidney disease with heart failure and stage 1 through stage 4 chronic kidney disease, or unspecified chronic kidney disease: Secondary | ICD-10-CM | POA: Diagnosis not present

## 2021-10-07 DIAGNOSIS — I2510 Atherosclerotic heart disease of native coronary artery without angina pectoris: Secondary | ICD-10-CM | POA: Diagnosis not present

## 2021-10-07 DIAGNOSIS — I5032 Chronic diastolic (congestive) heart failure: Secondary | ICD-10-CM | POA: Diagnosis not present

## 2021-10-07 DIAGNOSIS — N182 Chronic kidney disease, stage 2 (mild): Secondary | ICD-10-CM | POA: Diagnosis not present

## 2021-10-07 DIAGNOSIS — I5021 Acute systolic (congestive) heart failure: Secondary | ICD-10-CM | POA: Diagnosis not present

## 2021-10-07 DIAGNOSIS — I214 Non-ST elevation (NSTEMI) myocardial infarction: Secondary | ICD-10-CM | POA: Diagnosis not present

## 2021-10-10 DIAGNOSIS — I5021 Acute systolic (congestive) heart failure: Secondary | ICD-10-CM | POA: Diagnosis not present

## 2021-10-10 DIAGNOSIS — N182 Chronic kidney disease, stage 2 (mild): Secondary | ICD-10-CM | POA: Diagnosis not present

## 2021-10-10 DIAGNOSIS — I214 Non-ST elevation (NSTEMI) myocardial infarction: Secondary | ICD-10-CM | POA: Diagnosis not present

## 2021-10-10 DIAGNOSIS — I2510 Atherosclerotic heart disease of native coronary artery without angina pectoris: Secondary | ICD-10-CM | POA: Diagnosis not present

## 2021-10-10 DIAGNOSIS — I5032 Chronic diastolic (congestive) heart failure: Secondary | ICD-10-CM | POA: Diagnosis not present

## 2021-10-10 DIAGNOSIS — I130 Hypertensive heart and chronic kidney disease with heart failure and stage 1 through stage 4 chronic kidney disease, or unspecified chronic kidney disease: Secondary | ICD-10-CM | POA: Diagnosis not present

## 2021-10-16 DIAGNOSIS — I5021 Acute systolic (congestive) heart failure: Secondary | ICD-10-CM | POA: Diagnosis not present

## 2021-10-16 DIAGNOSIS — I214 Non-ST elevation (NSTEMI) myocardial infarction: Secondary | ICD-10-CM | POA: Diagnosis not present

## 2021-10-16 DIAGNOSIS — I2510 Atherosclerotic heart disease of native coronary artery without angina pectoris: Secondary | ICD-10-CM | POA: Diagnosis not present

## 2021-10-16 DIAGNOSIS — N182 Chronic kidney disease, stage 2 (mild): Secondary | ICD-10-CM | POA: Diagnosis not present

## 2021-10-16 DIAGNOSIS — I130 Hypertensive heart and chronic kidney disease with heart failure and stage 1 through stage 4 chronic kidney disease, or unspecified chronic kidney disease: Secondary | ICD-10-CM | POA: Diagnosis not present

## 2021-10-16 DIAGNOSIS — I5032 Chronic diastolic (congestive) heart failure: Secondary | ICD-10-CM | POA: Diagnosis not present

## 2021-10-18 ENCOUNTER — Telehealth: Payer: Self-pay | Admitting: Internal Medicine

## 2021-10-18 DIAGNOSIS — I214 Non-ST elevation (NSTEMI) myocardial infarction: Secondary | ICD-10-CM | POA: Diagnosis not present

## 2021-10-18 DIAGNOSIS — I5021 Acute systolic (congestive) heart failure: Secondary | ICD-10-CM | POA: Diagnosis not present

## 2021-10-18 DIAGNOSIS — I5032 Chronic diastolic (congestive) heart failure: Secondary | ICD-10-CM | POA: Diagnosis not present

## 2021-10-18 DIAGNOSIS — N182 Chronic kidney disease, stage 2 (mild): Secondary | ICD-10-CM | POA: Diagnosis not present

## 2021-10-18 DIAGNOSIS — I2510 Atherosclerotic heart disease of native coronary artery without angina pectoris: Secondary | ICD-10-CM | POA: Diagnosis not present

## 2021-10-18 DIAGNOSIS — I130 Hypertensive heart and chronic kidney disease with heart failure and stage 1 through stage 4 chronic kidney disease, or unspecified chronic kidney disease: Secondary | ICD-10-CM | POA: Diagnosis not present

## 2021-10-18 NOTE — Telephone Encounter (Signed)
Pt called in for a refill on oxyCODONE-acetaminophen (PERCOCET) 10-325 MG tablet  [id number]  [phone] Fax:  [phone]

## 2021-10-19 ENCOUNTER — Other Ambulatory Visit: Payer: Self-pay | Admitting: Internal Medicine

## 2021-10-19 DIAGNOSIS — I10 Essential (primary) hypertension: Secondary | ICD-10-CM

## 2021-10-20 ENCOUNTER — Other Ambulatory Visit: Payer: Self-pay | Admitting: Internal Medicine

## 2021-10-20 DIAGNOSIS — M159 Polyosteoarthritis, unspecified: Secondary | ICD-10-CM

## 2021-10-20 DIAGNOSIS — D508 Other iron deficiency anemias: Secondary | ICD-10-CM

## 2021-10-20 DIAGNOSIS — M5416 Radiculopathy, lumbar region: Secondary | ICD-10-CM

## 2021-10-20 MED ORDER — OXYCODONE-ACETAMINOPHEN 10-325 MG PO TABS: 1.0000 | ORAL_TABLET | Freq: Four times a day (QID) | ORAL | 0 refills | Status: DC | PRN

## 2021-10-20 NOTE — Telephone Encounter (Signed)
[facility] [address] [facility]. He [facility]  [id number] [place]  [phone] Fax:  [phone]

## 2021-10-22 ENCOUNTER — Other Ambulatory Visit: Payer: Self-pay | Admitting: Internal Medicine

## 2021-10-25 NOTE — Telephone Encounter (Signed)
Pt calling in for update on oxyCODONE-acetaminophen (PERCOCET) 10-325 MG tablet. Being sent to new pharmacy due to [facility] being out of stock.   [facility]  [id number]  [phone] Fax:  [phone]

## 2021-10-26 ENCOUNTER — Other Ambulatory Visit: Payer: Self-pay | Admitting: Internal Medicine

## 2021-10-26 DIAGNOSIS — I2510 Atherosclerotic heart disease of native coronary artery without angina pectoris: Secondary | ICD-10-CM | POA: Diagnosis not present

## 2021-10-26 DIAGNOSIS — M159 Polyosteoarthritis, unspecified: Secondary | ICD-10-CM

## 2021-10-26 DIAGNOSIS — I130 Hypertensive heart and chronic kidney disease with heart failure and stage 1 through stage 4 chronic kidney disease, or unspecified chronic kidney disease: Secondary | ICD-10-CM | POA: Diagnosis not present

## 2021-10-26 DIAGNOSIS — N182 Chronic kidney disease, stage 2 (mild): Secondary | ICD-10-CM | POA: Diagnosis not present

## 2021-10-26 DIAGNOSIS — I214 Non-ST elevation (NSTEMI) myocardial infarction: Secondary | ICD-10-CM | POA: Diagnosis not present

## 2021-10-26 DIAGNOSIS — I5021 Acute systolic (congestive) heart failure: Secondary | ICD-10-CM | POA: Diagnosis not present

## 2021-10-26 DIAGNOSIS — I5032 Chronic diastolic (congestive) heart failure: Secondary | ICD-10-CM | POA: Diagnosis not present

## 2021-10-26 DIAGNOSIS — M5416 Radiculopathy, lumbar region: Secondary | ICD-10-CM

## 2021-10-26 MED ORDER — OXYCODONE-ACETAMINOPHEN 10-325 MG PO TABS: 1.0000 | ORAL_TABLET | Freq: Four times a day (QID) | ORAL | 0 refills | Status: DC | PRN

## 2021-11-01 ENCOUNTER — Other Ambulatory Visit: Payer: Self-pay | Admitting: Emergency Medicine

## 2021-11-01 DIAGNOSIS — T502X5A Adverse effect of carbonic-anhydrase inhibitors, benzothiadiazides and other diuretics, initial encounter: Secondary | ICD-10-CM

## 2021-11-08 DIAGNOSIS — I214 Non-ST elevation (NSTEMI) myocardial infarction: Secondary | ICD-10-CM | POA: Diagnosis not present

## 2021-11-08 DIAGNOSIS — N182 Chronic kidney disease, stage 2 (mild): Secondary | ICD-10-CM | POA: Diagnosis not present

## 2021-11-08 DIAGNOSIS — I5032 Chronic diastolic (congestive) heart failure: Secondary | ICD-10-CM | POA: Diagnosis not present

## 2021-11-08 DIAGNOSIS — I130 Hypertensive heart and chronic kidney disease with heart failure and stage 1 through stage 4 chronic kidney disease, or unspecified chronic kidney disease: Secondary | ICD-10-CM | POA: Diagnosis not present

## 2021-11-08 DIAGNOSIS — I5021 Acute systolic (congestive) heart failure: Secondary | ICD-10-CM | POA: Diagnosis not present

## 2021-11-08 DIAGNOSIS — I2510 Atherosclerotic heart disease of native coronary artery without angina pectoris: Secondary | ICD-10-CM | POA: Diagnosis not present

## 2021-11-14 ENCOUNTER — Other Ambulatory Visit: Payer: Self-pay | Admitting: Internal Medicine

## 2021-11-14 ENCOUNTER — Telehealth: Payer: Self-pay | Admitting: Internal Medicine

## 2021-11-14 NOTE — Telephone Encounter (Signed)
Pt requested a refill of his oxyCODONE-acetaminophen (PERCOCET) 10-325 MG tablet RX be refilled on 7.15.23   [place] [id number]  Phone: [phone]:[phone]

## 2021-11-16 ENCOUNTER — Other Ambulatory Visit: Payer: Self-pay | Admitting: Internal Medicine

## 2021-11-23 ENCOUNTER — Other Ambulatory Visit: Payer: Self-pay | Admitting: Internal Medicine

## 2021-11-23 DIAGNOSIS — M5416 Radiculopathy, lumbar region: Secondary | ICD-10-CM

## 2021-11-23 DIAGNOSIS — M159 Polyosteoarthritis, unspecified: Secondary | ICD-10-CM

## 2021-11-23 MED ORDER — OXYCODONE-ACETAMINOPHEN 10-325 MG PO TABS: 1.0000 | ORAL_TABLET | Freq: Four times a day (QID) | ORAL | 0 refills | Status: DC | PRN

## 2021-12-19 ENCOUNTER — Telehealth: Payer: Self-pay

## 2021-12-19 NOTE — Telephone Encounter (Signed)
Pt is requesting a refill on: oxyCODONE-acetaminophen (PERCOCET) 10-325 MG tablet  [id number] ROV 12/26/21

## 2021-12-21 ENCOUNTER — Other Ambulatory Visit: Payer: Self-pay | Admitting: Internal Medicine

## 2021-12-21 DIAGNOSIS — M159 Polyosteoarthritis, unspecified: Secondary | ICD-10-CM

## 2021-12-21 DIAGNOSIS — M5416 Radiculopathy, lumbar region: Secondary | ICD-10-CM

## 2021-12-21 MED ORDER — OXYCODONE-ACETAMINOPHEN 10-325 MG PO TABS: 1.0000 | ORAL_TABLET | Freq: Four times a day (QID) | ORAL | 0 refills | Status: DC | PRN

## 2021-12-25 ENCOUNTER — Ambulatory Visit (INDEPENDENT_AMBULATORY_CARE_PROVIDER_SITE_OTHER): Payer: Medicare Other

## 2021-12-25 ENCOUNTER — Ambulatory Visit: Payer: Medicare Other | Admitting: Internal Medicine

## 2021-12-25 DIAGNOSIS — Z0000 Encounter for general adult medical examination without abnormal findings: Secondary | ICD-10-CM

## 2021-12-25 NOTE — Progress Notes (Signed)
Subjective:   Adrian Miller is a 81 y.o. male who presents for an Subsequent  Medicare Annual Wellness Visit.   I connected with Adrian Miller  today by telephone and verified that I am speaking with the correct person using two identifiers. Location patient: home Location provider: work Persons participating in the virtual visit: patient, provider.   I discussed the limitations, risks, security and privacy concerns of performing an evaluation and management service by telephone and the availability of in person appointments. I also discussed with the patient that there may be a patient responsible charge related to this service. The patient expressed understanding and verbally consented to this telephonic visit.    Interactive audio and video telecommunications were attempted between this provider and patient, however failed, due to patient having technical difficulties OR patient did not have access to video capability.  We continued and completed visit with audio only.    Review of Systems     Cardiac Risk Factors include: advanced age (>56mn, >>37women);male gender;dyslipidemia;hypertension     Objective:    Today's Vitals   There is no height or weight on file to calculate BMI.     12/25/2021   10:40 AM 09/20/2021    6:21 PM 06/10/2021    7:38 AM 07/11/2020    9:40 AM 05/19/2020    5:46 PM 08/20/2018    2:32 PM 03/20/2018    8:46 PM  Advanced Directives  Does Patient Have a Medical Advance Directive? No Unable to assess, patient is non-responsive or altered mental status No No No No No  Would patient like information on creating a medical advance directive? No - Patient declined  No - Patient declined No - Patient declined No - Patient declined  No - Patient declined    Current Medications (verified) Outpatient Encounter Medications as of 12/25/2021  Medication Sig   acetaminophen (TYLENOL) 325 MG tablet Take 650 mg by mouth every 6 (six) hours as needed (pain).    atorvastatin (LIPITOR) 40 MG tablet Take 1 tablet (40 mg total) by mouth daily.   clopidogrel (PLAVIX) 75 MG tablet Take 1 tablet (75 mg total) by mouth daily.   ferrous sulfate 325 (65 FE) MG tablet Take 1 tablet (325 mg total) by mouth every morning.   KLOR-CON M20 20 MEQ tablet TAKE 1 TABLET BY MOUTH EVERY DAY   metoprolol succinate (TOPROL-XL) 25 MG 24 hr tablet Take 0.5 tablets (12.5 mg total) by mouth daily.   nitroGLYCERIN (NITROSTAT) 0.4 MG SL tablet Place 1 tablet (0.4 mg total) under the tongue every 5 (five) minutes as needed for chest pain.   oxyCODONE-acetaminophen (PERCOCET) 10-325 MG tablet Take 1 tablet by mouth every 6 (six) hours as needed for pain.   pantoprazole (PROTONIX) 40 MG tablet TAKE 1 TABLET BY MOUTH EVERY DAY (Patient taking differently: 40 mg every morning.)   sacubitril-valsartan (ENTRESTO) 24-26 MG Take 1 tablet by mouth 2 (two) times daily.   Tetrahydrozoline HCl (VISINE OP) Place 1 drop into both eyes daily as needed (blurry vision).   aspirin EC 81 MG tablet Take 1 tablet (81 mg total) by mouth daily. (Patient not taking: Reported on 12/25/2021)   OVER THE COUNTER MEDICATION Take 1 tablet by mouth at bedtime as needed (frequent urination). Super B Prostate   No facility-administered encounter medications on file as of 12/25/2021.    Allergies (verified) Asa [aspirin] and [name] [aspirin-salicylamide-caffeine]   History: Past Medical History:  Diagnosis Date  Acute esophagitis 06/01/2015   Arthritis    Benign essential HTN 05/31/2015   Duodenal ulcer hemorrhage    Esophageal stricture 06/01/2015   Gastric ulcer    Hiatal hernia 06/01/2015   Past Surgical History:  Procedure Laterality Date   BIOPSY  05/20/2020   Procedure: BIOPSY;  Surgeon: Lavena Bullion, DO;  Location: [facility];  Service: Gastroenterology;;   CHOLECYSTECTOMY N/A 09/30/2017   Procedure: LAPAROSCOPIC CHOLECYSTECTOMY WITH INTRAOPERATIVE CHOLANGIOGRAM;  Surgeon:  Excell Seltzer, MD;  Location: [facility];  Service: General;  Laterality: N/A;   COLONOSCOPY     ESOPHAGOGASTRODUODENOSCOPY N/A 05/31/2015   Procedure: ESOPHAGOGASTRODUODENOSCOPY (EGD);  Surgeon: Ladene Artist, MD;  Location: [facility];  Service: Endoscopy;  Laterality: N/A;   ESOPHAGOGASTRODUODENOSCOPY (EGD) WITH PROPOFOL N/A 05/20/2020   Procedure: ESOPHAGOGASTRODUODENOSCOPY (EGD) WITH PROPOFOL;  Surgeon: Lavena Bullion, DO;  Location: [facility];  Service: Gastroenterology;  Laterality: N/A;   HERNIA REPAIR  1975   LEFT HEART CATH AND CORONARY ANGIOGRAPHY N/A 09/21/2021   Procedure: LEFT HEART CATH AND CORONARY ANGIOGRAPHY;  Surgeon: Nigel Mormon, MD;  Location: [place] [facility];  Service: Cardiovascular;  Laterality: N/A;   LEFT ROTATOR CUFF REPAIR  2002   REPLACEMENT TOTAL KNEE Left    REVERSE SHOULDER ARTHROPLASTY Right 03/20/2018   Procedure: RIGHT REVERSE SHOULDER ARTHROPLASTY;  Surgeon: Tania Ade, MD;  Location: [place] [facility];  Service: Orthopedics;  Laterality: Right;   TOTAL KNEE ARTHROPLASTY Right 02/22/2015   Procedure: TOTAL KNEE ARTHROPLASTY;  Surgeon: Melrose Nakayama, MD;  Location: [facility];  Service: Orthopedics;  Laterality: Right;   Family History  Problem Relation Age of Onset   Hypertension Mother    Heart attack Sister    Colon cancer Neg Hx    Esophageal cancer Neg Hx    Rectal cancer Neg Hx    Stomach cancer Neg Hx    Social History   Socioeconomic History   Marital status: Single    Spouse name: Not on file   Number of children: Not on file   Years of education: Not on file   Highest education level: Not on file  Occupational History   Not on file  Tobacco Use   Smoking status: Every Day    Packs/day: 0.50    Years: 51.00    Total pack years: 25.50    Types: Cigarettes   Smokeless tobacco: Former    Quit date: 11/03/2014  Vaping Use   Vaping Use: Never used  Substance and Sexual Activity   Alcohol use: No    Alcohol/week: 0.0  standard drinks of alcohol   Drug use: No   Sexual activity: Never  Other Topics Concern   Not on file  Social History Narrative   Not on file   Social Determinants of Health   Financial Resource Strain: Low Risk  (12/25/2021)   Overall Financial Resource Strain (CARDIA)    Difficulty of Paying Living Expenses: Not hard at all  Food Insecurity: No Food Insecurity (12/25/2021)   Hunger Vital Sign    Worried About Running Out of Food in the Last Year: Never true    [place] in the Last Year: Never true  Transportation Needs: No Transportation Needs (12/25/2021)   PRAPARE - Hydrologist (Medical): No    Lack of Transportation (Non-Medical): No  Physical Activity: Sufficiently Active (12/25/2021)   Exercise Vital Sign    Days of Exercise per Week: 5 days    Minutes of  Exercise per Session: 30 min  Stress: No Stress Concern Present (12/25/2021)   [place]    Feeling of Stress : Not at all  Social Connections: Socially Isolated (12/25/2021)   Social Connection and Isolation Panel [NHANES]    Frequency of Communication with Friends and Family: Three times a week    Frequency of Social Gatherings with Friends and Family: Three times a week    Attends Religious Services: Never    Active Member of Clubs or Organizations: No    Attends Archivist Meetings: Never    Marital Status: Widowed    Tobacco Counseling Ready to quit: Not Answered Counseling given: Not Answered   Clinical Intake:  Pre-visit preparation completed: Yes  Pain : No/denies pain     Nutritional Risks: None Diabetes: No  How often do you need to have someone help you when you read instructions, pamphlets, or other written materials from your doctor or pharmacy?: 1 - Never What is the last grade level you completed in school?: 8th grade  Diabetic?no   Interpreter Needed?: No  Information entered  by :: L.Wilson,LPN   Activities of Daily Living    12/25/2021   10:40 AM  In your present state of health, do you have any difficulty performing the following activities:  Hearing? 0  Vision? 0  Difficulty concentrating or making decisions? 0  Walking or climbing stairs? 0  Dressing or bathing? 0  Doing errands, shopping? 0  Preparing Food and eating ? N  Using the Toilet? N  In the past six months, have you accidently leaked urine? N  Do you have problems with loss of bowel control? N  Managing your Medications? N  Managing your Finances? N  Housekeeping or managing your Housekeeping? N    Patient Care Team: Janith Lima, MD as PCP - General (Internal Medicine)  Indicate any recent Medical Services you may have received from other than [facility] providers in the past year (date may be approximate).     Assessment:   This is a routine wellness examination for Adrian Miller.  Hearing/Vision screen Vision Screening - Comments:: Annual eye exams wears glasses   Dietary issues and exercise activities discussed: Current Exercise Habits: Home exercise routine, Type of exercise: walking, Time (Minutes): 30, Frequency (Times/Week): 3, Weekly Exercise (Minutes/Week): 90, Intensity: Mild, Exercise limited by: orthopedic condition(s)   Goals Addressed   None    Depression Screen    12/25/2021   10:40 AM 12/25/2021   10:35 AM 06/27/2021    1:14 PM 12/02/2018    8:16 AM 08/09/2017    8:35 AM 11/17/2014    2:18 PM 03/06/2013    2:49 PM  PHQ 2/9 Scores  PHQ - 2 Score 0 0 0 0 0 0 0    Fall Risk    12/25/2021   10:42 AM 12/25/2021   10:41 AM 06/27/2021    1:14 PM 12/02/2018    8:16 AM 08/09/2017    8:35 AM  Fall Risk   Falls in the past year? 0 0 0 0 No  Number falls in past yr: 0 0  0   Injury with Fall? 0 0  0   Risk for fall due to :    Orthopedic patient   Follow up Falls evaluation completed;Education provided Falls evaluation completed;Education provided  Falls evaluation  completed;Education provided   Comment uses cane and walker        [place]  PERTAINING TO THE HOME:  Any stairs in or around the home? Yes  If so, are there any without handrails? No  Home free of loose throw rugs in walkways, pet beds, electrical cords, etc? Yes  Adequate lighting in your home to reduce risk of falls? Yes   ASSISTIVE DEVICES UTILIZED TO PREVENT FALLS:  Life alert? No  Use of a cane, walker or w/c? No  Grab bars in the bathroom? Yes  Shower chair or bench in shower? No  Elevated toilet seat or a handicapped toilet? No     Cognitive Function:  Normal cognitive status assessed by Telephone conversation by this Nurse Health Advisor. No abnormalities found.        12/25/2021   10:42 AM 12/25/2021   10:41 AM  6CIT Screen  What Year? 0 points 0 points  What month? 0 points 0 points  What time? 0 points 0 points  Count back from 20 0 points 0 points  Months in reverse 0 points 0 points  Repeat phrase 0 points 0 points  Total Score 0 points 0 points    Immunizations Immunization History  Administered Date(s) Administered   Fluad Quad(high Dose 65+) 04/29/2019, 04/25/2020, 06/27/2021   Influenza Split 02/07/2012   Influenza, High Dose Seasonal PF 01/22/2018   Influenza,inj,Quad PF,6+ Mos 03/18/2014, 02/24/2015, 12/26/2015   PFIZER Comirnaty(Gray Top)Covid-19 Tri-Sucrose Vaccine 06/02/2020   Pneumococcal Conjugate-13 03/18/2014   Pneumococcal Polysaccharide-23 11/14/2010, 10/17/2017, 06/27/2021   Tdap 11/14/2010   Zoster, Live 02/14/2016    TDAP status: Due, Education has been provided regarding the importance of this vaccine. Advised may receive this vaccine at local pharmacy or Health Dept. Aware to provide a copy of the vaccination record if obtained from local pharmacy or Health Dept. Verbalized acceptance and understanding.  Flu Vaccine status: Up to date  Pneumococcal vaccine status: Up to date  Covid-19 vaccine status: Completed  vaccines  Qualifies for Shingles Vaccine? Yes   Zostavax completed No   Shingrix Completed?: No.    Education has been provided regarding the importance of this vaccine. Patient has been advised to call insurance company to determine out of pocket expense if they have not yet received this vaccine. Advised may also receive vaccine at local pharmacy or Health Dept. Verbalized acceptance and understanding.  Screening Tests Health Maintenance  Topic Date Due   Diabetic kidney evaluation - Urine ACR  Never Miller   Zoster Vaccines- Shingrix (1 of 2) Never Miller   OPHTHALMOLOGY EXAM  02/25/2015   FOOT EXAM  01/23/2019   COVID-19 Vaccine (2 - Pfizer series) 07/28/2020   TETANUS/TDAP  11/13/2020   INFLUENZA VACCINE  12/05/2021   HEMOGLOBIN A1C  03/23/2022   Diabetic kidney evaluation - GFR measurement  09/24/2022   Pneumonia Vaccine 49+ Years old  Completed   HPV VACCINES  Aged Out    Health Maintenance  Health Maintenance Due  Topic Date Due   Diabetic kidney evaluation - Urine ACR  Never Miller   Zoster Vaccines- Shingrix (1 of 2) Never Miller   OPHTHALMOLOGY EXAM  02/25/2015   FOOT EXAM  01/23/2019   COVID-19 Vaccine (2 - Pfizer series) 07/28/2020   TETANUS/TDAP  11/13/2020   INFLUENZA VACCINE  12/05/2021    Colorectal cancer screening: No longer required.   Lung Cancer Screening: (Low Dose CT Chest recommended if Age 48-80 years, 30 pack-year currently smoking OR have quit w/in 15years.) does not qualify.   Lung Cancer Screening Referral: n/a  Additional Screening:  Hepatitis C  Screening: does not qualify;  Vision Screening: Recommended annual ophthalmology exams for early detection of glaucoma and other disorders of the eye. Is the patient up to date with their annual eye exam?  Yes  Who is the provider or what is the name of the office in which the patient attends annual eye exams? Dr.Shipiro  If pt is not established with a provider, would they like to be referred to a  provider to establish care? No .   Dental Screening: Recommended annual dental exams for proper oral hygiene  Community Resource Referral / Chronic Care Management: CRR required this visit?  No   CCM required this visit?  No      Plan:     I have personally reviewed and noted the following in the patient's chart:   Medical and social history Use of alcohol, tobacco or illicit drugs  Current medications and supplements including opioid prescriptions. Patient is not currently taking opioid prescriptions. Functional ability and status Nutritional status Physical activity Advanced directives List of other physicians Hospitalizations, surgeries, and ER visits in previous 12 months Vitals Screenings to include cognitive, depression, and falls Referrals and appointments  In addition, I have reviewed and discussed with patient certain preventive protocols, quality metrics, and best practice recommendations. A written personalized care plan for preventive services as well as general preventive health recommendations were provided to patient.     Daphane Shepherd, LPN   [id number]   Nurse Notes: none

## 2021-12-25 NOTE — Patient Instructions (Signed)
[name] , Thank you for taking time to come for your Medicare Wellness Visit. I appreciate your ongoing commitment to your health goals. Please review the following plan we discussed and let me know if I can assist you in the future.   Screening recommendations/referrals: Colonoscopy: no longer required  Recommended yearly ophthalmology/optometry visit for glaucoma screening and checkup Recommended yearly dental visit for hygiene and checkup  Vaccinations: Influenza vaccine: completed  Pneumococcal vaccine: Completed  Tdap vaccine: due  Shingles vaccine: will consider     Advanced directives: none   Conditions/risks identified: none   Next appointment: none  Preventive Care 80 Years and Older, Male Preventive care refers to lifestyle choices and visits with your health care provider that can promote health and wellness. What does preventive care include? A yearly physical exam. This is also called an annual well check. Dental exams once or twice a year. Routine eye exams. Ask your health care provider how often you should have your eyes checked. Personal lifestyle choices, including: Daily care of your teeth and gums. Regular physical activity. Eating a healthy diet. Avoiding tobacco and drug use. Limiting alcohol use. Practicing safe sex. Taking low doses of aspirin every day. Taking vitamin and mineral supplements as recommended by your health care provider. What happens during an annual well check? The services and screenings done by your health care provider during your annual well check will depend on your age, overall health, lifestyle risk factors, and family history of disease. Counseling  Your health care provider may ask you questions about your: Alcohol use. Tobacco use. Drug use. Emotional well-being. Home and relationship well-being. Sexual activity. Eating habits. History of falls. Memory and ability to understand (cognition). Work and work  Statistician. Screening  You may have the following tests or measurements: Height, weight, and BMI. Blood pressure. Lipid and cholesterol levels. These may be checked every 5 years, or more frequently if you are over 42 years old. Skin check. Lung cancer screening. You may have this screening every year starting at age 11 if you have a 30-pack-year history of smoking and currently smoke or have quit within the past 15 years. Fecal occult blood test (FOBT) of the stool. You may have this test every year starting at age 36. Flexible sigmoidoscopy or colonoscopy. You may have a sigmoidoscopy every 5 years or a colonoscopy every 10 years starting at age 19. Prostate cancer screening. Recommendations will vary depending on your family history and other risks. Hepatitis C blood test. Hepatitis B blood test. Sexually transmitted disease (STD) testing. Diabetes screening. This is done by checking your blood sugar (glucose) after you have not eaten for a while (fasting). You may have this done every 1-3 years. Abdominal aortic aneurysm (AAA) screening. You may need this if you are a current or former smoker. Osteoporosis. You may be screened starting at age 32 if you are at high risk. Talk with your health care provider about your test results, treatment options, and if necessary, the need for more tests. Vaccines  Your health care provider may recommend certain vaccines, such as: Influenza vaccine. This is recommended every year. Tetanus, diphtheria, and acellular pertussis (Tdap, Td) vaccine. You may need a Td booster every 10 years. Zoster vaccine. You may need this after age 58. Pneumococcal 13-valent conjugate (PCV13) vaccine. One dose is recommended after age 39. Pneumococcal polysaccharide (PPSV23) vaccine. One dose is recommended after age 97. Talk to your health care provider about which screenings and vaccines you need and  how often you need them. This information is not intended to replace  advice given to you by your health care provider. Make sure you discuss any questions you have with your health care provider. Document Released: 05/20/2015 Document Revised: 01/11/2016 Document Reviewed: 02/22/2015 Elsevier Interactive Patient Education  2017 [place] Prevention in the Home Falls can cause injuries. They can happen to people of all ages. There are many things you can do to make your home safe and to help prevent falls. What can I do on the outside of my home? Regularly fix the edges of walkways and driveways and fix any cracks. Remove anything that might make you trip as you walk through a door, such as a raised step or threshold. Trim any bushes or trees on the path to your home. Use bright outdoor lighting. Clear any walking paths of anything that might make someone trip, such as rocks or tools. Regularly check to see if handrails are loose or broken. Make sure that both sides of any steps have handrails. Any raised decks and porches should have guardrails on the edges. Have any leaves, snow, or ice cleared regularly. Use sand or salt on walking paths during winter. Clean up any spills in your garage right away. This includes oil or grease spills. What can I do in the bathroom? Use night lights. Install grab bars by the toilet and in the tub and shower. Do not use towel bars as grab bars. Use non-skid mats or decals in the tub or shower. If you need to sit down in the shower, use a plastic, non-slip stool. Keep the floor dry. Clean up any water that spills on the floor as soon as it happens. Remove soap buildup in the tub or shower regularly. Attach bath mats securely with double-sided non-slip rug tape. Do not have throw rugs and other things on the floor that can make you trip. What can I do in the bedroom? Use night lights. Make sure that you have a light by your bed that is easy to reach. Do not use any sheets or blankets that are too big for your bed.  They should not hang down onto the floor. Have a firm chair that has side arms. You can use this for support while you get dressed. Do not have throw rugs and other things on the floor that can make you trip. What can I do in the kitchen? Clean up any spills right away. Avoid walking on wet floors. Keep items that you use a lot in easy-to-reach places. If you need to reach something above you, use a strong step stool that has a grab bar. Keep electrical cords out of the way. Do not use floor polish or wax that makes floors slippery. If you must use wax, use non-skid floor wax. Do not have throw rugs and other things on the floor that can make you trip. What can I do with my stairs? Do not leave any items on the stairs. Make sure that there are handrails on both sides of the stairs and use them. Fix handrails that are broken or loose. Make sure that handrails are as long as the stairways. Check any carpeting to make sure that it is firmly attached to the stairs. Fix any carpet that is loose or worn. Avoid having throw rugs at the top or bottom of the stairs. If you do have throw rugs, attach them to the floor with carpet tape. Make sure that you have  a light switch at the top of the stairs and the bottom of the stairs. If you do not have them, ask someone to add them for you. What else can I do to help prevent falls? Wear shoes that: Do not have high heels. Have rubber bottoms. Are comfortable and fit you well. Are closed at the toe. Do not wear sandals. If you use a stepladder: Make sure that it is fully opened. Do not climb a closed stepladder. Make sure that both sides of the stepladder are locked into place. Ask someone to hold it for you, if possible. Clearly mark and make sure that you can see: Any grab bars or handrails. First and last steps. Where the edge of each step is. Use tools that help you move around (mobility aids) if they are needed. These  include: Canes. Walkers. Scooters. Crutches. Turn on the lights when you go into a dark area. Replace any light bulbs as soon as they burn out. Set up your furniture so you have a clear path. Avoid moving your furniture around. If any of your floors are uneven, fix them. If there are any pets around you, be aware of where they are. Review your medicines with your doctor. Some medicines can make you feel dizzy. This can increase your chance of falling. Ask your doctor what other things that you can do to help prevent falls. This information is not intended to replace advice given to you by your health care provider. Make sure you discuss any questions you have with your health care provider. Document Released: 02/17/2009 Document Revised: 09/29/2015 Document Reviewed: 05/28/2014 Elsevier Interactive Patient Education  2017 [employer].

## 2021-12-26 ENCOUNTER — Telehealth: Payer: Self-pay

## 2021-12-26 ENCOUNTER — Other Ambulatory Visit: Payer: Self-pay | Admitting: Internal Medicine

## 2021-12-26 ENCOUNTER — Other Ambulatory Visit: Payer: Self-pay

## 2021-12-26 ENCOUNTER — Encounter: Payer: Self-pay | Admitting: Internal Medicine

## 2021-12-26 ENCOUNTER — Ambulatory Visit (INDEPENDENT_AMBULATORY_CARE_PROVIDER_SITE_OTHER): Payer: Medicare Other | Admitting: Internal Medicine

## 2021-12-26 VITALS — BP 118/66 | HR 89 | Temp 97.7°F | Resp 18 | Ht 69.0 in | Wt 168.0 lb

## 2021-12-26 DIAGNOSIS — D509 Iron deficiency anemia, unspecified: Secondary | ICD-10-CM | POA: Diagnosis not present

## 2021-12-26 DIAGNOSIS — D62 Acute posthemorrhagic anemia: Secondary | ICD-10-CM | POA: Diagnosis not present

## 2021-12-26 DIAGNOSIS — I10 Essential (primary) hypertension: Secondary | ICD-10-CM | POA: Diagnosis not present

## 2021-12-26 DIAGNOSIS — D510 Vitamin B12 deficiency anemia due to intrinsic factor deficiency: Secondary | ICD-10-CM | POA: Diagnosis not present

## 2021-12-26 DIAGNOSIS — E876 Hypokalemia: Secondary | ICD-10-CM

## 2021-12-26 DIAGNOSIS — N182 Chronic kidney disease, stage 2 (mild): Secondary | ICD-10-CM | POA: Diagnosis not present

## 2021-12-26 DIAGNOSIS — Z0001 Encounter for general adult medical examination with abnormal findings: Secondary | ICD-10-CM | POA: Diagnosis not present

## 2021-12-26 DIAGNOSIS — T502X5A Adverse effect of carbonic-anhydrase inhibitors, benzothiadiazides and other diuretics, initial encounter: Secondary | ICD-10-CM | POA: Diagnosis not present

## 2021-12-26 LAB — IBC + FERRITIN
Ferritin: 21.3 ng/mL — ABNORMAL LOW (ref 22.0–322.0)
Iron: 88 ug/dL (ref 42–165)
Saturation Ratios: 25.6 % (ref 20.0–50.0)
TIBC: 344.4 ug/dL (ref 250.0–450.0)
Transferrin: 246 mg/dL (ref 212.0–360.0)

## 2021-12-26 LAB — CBC WITH DIFFERENTIAL/PLATELET
Basophils Absolute: 0 10*3/uL (ref 0.0–0.1)
Basophils Relative: 0.5 % (ref 0.0–3.0)
Eosinophils Absolute: 0.2 10*3/uL (ref 0.0–0.7)
Eosinophils Relative: 3.1 % (ref 0.0–5.0)
HCT: 40.1 % (ref 39.0–52.0)
Hemoglobin: 13.6 g/dL (ref 13.0–17.0)
Lymphocytes Relative: 26 % (ref 12.0–46.0)
Lymphs Abs: 2 10*3/uL (ref 0.7–4.0)
MCHC: 33.9 g/dL (ref 30.0–36.0)
MCV: 95.4 fl (ref 78.0–100.0)
Monocytes Absolute: 0.8 10*3/uL (ref 0.1–1.0)
Monocytes Relative: 9.7 % (ref 3.0–12.0)
Neutro Abs: 4.7 10*3/uL (ref 1.4–7.7)
Neutrophils Relative %: 60.7 % (ref 43.0–77.0)
Platelets: 330 10*3/uL (ref 150.0–400.0)
RBC: 4.2 Mil/uL — ABNORMAL LOW (ref 4.22–5.81)
RDW: 15.3 % (ref 11.5–15.5)
WBC: 7.8 10*3/uL (ref 4.0–10.5)

## 2021-12-26 LAB — CBC
HCT: 40.1 % (ref 39.0–52.0)
Hemoglobin: 13.6 g/dL (ref 13.0–17.0)
MCHC: 33.9 g/dL (ref 30.0–36.0)
MCV: 95.4 fl (ref 78.0–100.0)
Platelets: 330 10*3/uL (ref 150.0–400.0)
RBC: 4.2 Mil/uL — ABNORMAL LOW (ref 4.22–5.81)
RDW: 15.3 % (ref 11.5–15.5)
WBC: 7.8 10*3/uL (ref 4.0–10.5)

## 2021-12-26 LAB — BASIC METABOLIC PANEL
BUN: 18 mg/dL (ref 6–23)
CO2: 31 mEq/L (ref 19–32)
Calcium: 9.5 mg/dL (ref 8.4–10.5)
Chloride: 100 mEq/L (ref 96–112)
Creatinine, Ser: 1.09 mg/dL (ref 0.40–1.50)
GFR: 64.04 mL/min (ref >60.00)
Glucose, Bld: 97 mg/dL (ref 70–99)
Potassium: 2.8 mEq/L — CL (ref 3.5–5.1)
Sodium: 141 mEq/L (ref 135–145)

## 2021-12-26 LAB — HEPATIC FUNCTION PANEL
ALT: 15 U/L (ref 0–53)
AST: 18 U/L (ref 0–37)
Albumin: 4.5 g/dL (ref 3.5–5.2)
Alkaline Phosphatase: 100 U/L (ref 39–117)
Bilirubin, Direct: 0.3 mg/dL (ref 0.0–0.3)
Total Bilirubin: 1.1 mg/dL (ref 0.2–1.2)
Total Protein: 8 g/dL (ref 6.0–8.3)

## 2021-12-26 LAB — FOLATE: Folate: 18.6 ng/mL (ref >5.9)

## 2021-12-26 MED ORDER — CYANOCOBALAMIN 1000 MCG/ML IJ SOLN
1000.0000 ug | Freq: Once | INTRAMUSCULAR | Status: AC
  Administered 2021-12-26: 1000 ug via INTRAMUSCULAR

## 2021-12-26 MED ORDER — BOOSTRIX 5-2.5-18.5 LF-MCG/0.5 IM SUSP: 0.5000 mL | Freq: Once | INTRAMUSCULAR | 0 refills | Status: AC

## 2021-12-26 MED ORDER — SHINGRIX 50 MCG/0.5ML IM SUSR: 0.5000 mL | Freq: Once | INTRAMUSCULAR | 1 refills | Status: AC

## 2021-12-26 MED ORDER — POTASSIUM CHLORIDE CRYS ER 20 MEQ PO TBCR: 20.0000 meq | EXTENDED_RELEASE_TABLET | Freq: Two times a day (BID) | ORAL | 0 refills | Status: DC

## 2021-12-26 NOTE — Progress Notes (Unsigned)
Subjective:  Patient ID: Adrian Miller, male    DOB: 05/26/1940  Age: 81 y.o. MRN: [id number]  CC: Annual Exam and Anemia   HPI DYSHAUN BONZO presents for a CPX and f/up -   Outpatient Medications Prior to Visit  Medication Sig Dispense Refill   acetaminophen (TYLENOL) 325 MG tablet Take 650 mg by mouth every 6 (six) hours as needed (pain).     atorvastatin (LIPITOR) 40 MG tablet Take 1 tablet (40 mg total) by mouth daily. 30 tablet 2   clopidogrel (PLAVIX) 75 MG tablet Take 1 tablet (75 mg total) by mouth daily. 30 tablet 2   ferrous sulfate 325 (65 FE) MG tablet Take 1 tablet (325 mg total) by mouth every morning. 90 tablet 1   metoprolol succinate (TOPROL-XL) 25 MG 24 hr tablet Take 0.5 tablets (12.5 mg total) by mouth daily. 30 tablet 2   nitroGLYCERIN (NITROSTAT) 0.4 MG SL tablet Place 1 tablet (0.4 mg total) under the tongue every 5 (five) minutes as needed for chest pain. 30 tablet 2   OVER THE COUNTER MEDICATION Take 1 tablet by mouth at bedtime as needed (frequent urination). Super B Prostate     oxyCODONE-acetaminophen (PERCOCET) 10-325 MG tablet Take 1 tablet by mouth every 6 (six) hours as needed for pain. 100 tablet 0   pantoprazole (PROTONIX) 40 MG tablet TAKE 1 TABLET BY MOUTH EVERY DAY (Patient taking differently: 40 mg every morning.) 90 tablet 0   sacubitril-valsartan (ENTRESTO) 24-26 MG Take 1 tablet by mouth 2 (two) times daily. 60 tablet 2   KLOR-CON M20 20 MEQ tablet TAKE 1 TABLET BY MOUTH EVERY DAY 30 tablet 3   Tetrahydrozoline HCl (VISINE OP) Place 1 drop into both eyes daily as needed (blurry vision).     aspirin EC 81 MG tablet Take 1 tablet (81 mg total) by mouth daily. (Patient not taking: Reported on 12/25/2021) 30 tablet 2   No facility-administered medications prior to visit.    ROS Review of Systems  Objective:  BP 118/66   Pulse 89   Temp 97.7 F (36.5 C) (Temporal)   Resp 18   Ht '5\' 9"'$  (1.753 m)   Wt 168 lb (76.2 kg)   SpO2 97%   BMI 24.81  kg/m   BP Readings from Last 3 Encounters:  12/26/21 118/66  09/23/21 107/65  06/27/21 128/78    Wt Readings from Last 3 Encounters:  12/26/21 168 lb (76.2 kg)  09/23/21 178 lb 5.6 oz (80.9 kg)  06/27/21 180 lb (81.6 kg)    Physical Exam  Lab Results  Component Value Date   WBC 7.8 12/26/2021   WBC 7.8 12/26/2021   HGB 13.6 12/26/2021   HGB 13.6 12/26/2021   HCT 40.1 12/26/2021   HCT 40.1 12/26/2021   PLT 330.0 12/26/2021   PLT 330.0 12/26/2021   GLUCOSE 97 12/26/2021   CHOL 110 09/20/2021   TRIG 106 09/20/2021   HDL 33 (L) 09/20/2021   LDLDIRECT 47.0 10/22/2019   LDLCALC 56 09/20/2021   ALT 15 12/26/2021   AST 18 12/26/2021   NA 141 12/26/2021   K 2.8 (LL) 12/26/2021   CL 100 12/26/2021   CREATININE 1.09 12/26/2021   BUN 18 12/26/2021   CO2 31 12/26/2021   TSH 0.486 09/21/2021   PSA 0.87 03/18/2014   INR 1.2 09/20/2021   HGBA1C 5.7 (H) 09/20/2021    ECHOCARDIOGRAM COMPLETE  Result Date: 09/22/2021    ECHOCARDIOGRAM REPORT   Patient Name:  [name] Date of Exam: 09/21/2021 Medical Rec #:  [id number]     Height:       69.0 in Accession #:    [id number]    Weight:       180.0 lb Date of Birth:  13-Dec-1940      BSA:          1.976 m Patient Age:    83 years      BP:           143/86 mmHg Patient Gender: M             HR:           56 bpm. Exam Location:  Inpatient Procedure: 2D Echo, Cardiac Doppler, Color Doppler and Intracardiac            Opacification Agent                                 MODIFIED REPORT: This report was modified by Vernell Leep MD on 09/22/2021 due to Correction.  Indications:     122-I22.9 Subsequent ST elevation (STEM) and non-ST elevation                  (NSTEMI) myocardial infarction  History:         Patient has prior history of Echocardiogram examinations, most                  recent 06/01/2015. CHF, Acute MI, Abnormal ECG,                  Arrythmias:Bradycardia; Risk Factors:Hypertension and Current                  Smoker.   Sonographer:     Roseanna Rainbow RDCS Referring Phys:  [id number] Rhetta Mura Diagnosing Phys: Vernell Leep MD  Sonographer Comments: Technically difficult study due to poor echo windows, suboptimal parasternal window and suboptimal apical window. Image acquisition challenging due to patient body habitus. Attempted to turn, very low parasternal. IMPRESSIONS  1. Left ventricular ejection fraction, by estimation, is 35 to 40%. The left ventricle has normal function. The left ventricle demonstrates regional wall motion abnormalities (see scoring diagram/findings for description). Left ventricular diastolic parameters are consistent with Grade I diastolic dysfunction (impaired relaxation). There is moderate hypokinesis of the left ventricular, mid-apical anteroseptal wall, inferolateral wall and apical segment.  2. Right ventricular systolic function is normal. The right ventricular size is normal. There is mildly elevated pulmonary artery systolic pressure 40 mmHg.  3. The mitral valve is normal in structure. No evidence of mitral valve regurgitation. No evidence of mitral stenosis.  4. Tricuspid valve regurgitation is mild to moderate.  5. The aortic valve is calcified. Aortic valve regurgitation is mild. No aortic stenosis is present. FINDINGS  Left Ventricle: Left ventricular ejection fraction, by estimation, is 35 to 40%. The left ventricle has normal function. The left ventricle demonstrates regional wall motion abnormalities. Moderate hypokinesis of the left ventricular, mid-apical anteroseptal wall, inferolateral wall and apical segment. Definity contrast agent was given IV to delineate the left ventricular endocardial borders. The left ventricular internal cavity size was normal in size. There is no left ventricular hypertrophy. Left ventricular diastolic parameters are consistent with Grade I diastolic dysfunction (impaired relaxation). Normal left ventricular filling pressure. Right Ventricle: The right  ventricular size is normal. No increase in right ventricular wall thickness. Right ventricular systolic  function is normal. There is mildly elevated pulmonary artery systolic pressure. The tricuspid regurgitant velocity is 3.03  m/s, and with an assumed right atrial pressure of 3 mmHg, the estimated right ventricular systolic pressure is 82.9 mmHg. Left Atrium: Left atrial size was normal in size. Right Atrium: Right atrial size was normal in size. Pericardium: There is no evidence of pericardial effusion. Mitral Valve: The mitral valve is normal in structure. No evidence of mitral valve regurgitation. No evidence of mitral valve stenosis. Tricuspid Valve: The tricuspid valve is normal in structure. Tricuspid valve regurgitation is mild to moderate. Aortic Valve: The aortic valve is calcified. Aortic valve regurgitation is mild. No aortic stenosis is present. Pulmonic Valve: The pulmonic valve was normal in structure. Pulmonic valve regurgitation is not visualized. No evidence of pulmonic stenosis. Aorta: The aortic root is normal in size and structure. IAS/Shunts: The interatrial septum was not assessed.  LEFT VENTRICLE PLAX 2D LVIDd:         4.80 cm      Diastology LVIDs:         3.60 cm      LV e' medial:    5.98 cm/s LV PW:         1.00 cm      LV E/e' medial:  11.3 LV IVS:        1.00 cm      LV e' lateral:   6.31 cm/s LVOT diam:     2.60 cm      LV E/e' lateral: 10.7 LV SV:         83 LV SV Index:   42 LVOT Area:     5.31 cm  LV Volumes (MOD) LV vol d, MOD A2C: 99.4 ml LV vol d, MOD A4C: 114.0 ml LV vol s, MOD A2C: 61.8 ml LV vol s, MOD A4C: 68.5 ml LV SV MOD A2C:     37.6 ml LV SV MOD A4C:     114.0 ml LV SV MOD BP:      45.8 ml RIGHT VENTRICLE             IVC RV S prime:     14.70 cm/s  IVC diam: 1.90 cm TAPSE (M-mode): 2.4 cm LEFT ATRIUM             Index        RIGHT ATRIUM           Index LA diam:        3.40 cm 1.72 cm/m   RA Area:     13.50 cm LA Vol (A2C):   33.0 ml 16.70 ml/m  RA Volume:   32.70  ml  16.55 ml/m LA Vol (A4C):   26.4 ml 13.36 ml/m LA Biplane Vol: 32.2 ml 16.30 ml/m  AORTIC VALVE LVOT Vmax:   79.60 cm/s LVOT Vmean:  49.900 cm/s LVOT VTI:    0.157 m  AORTA Ao Root diam: 3.40 cm MITRAL VALVE               TRICUSPID VALVE MV Area (PHT): 4.17 cm    TR Peak grad:   36.7 mmHg MV Decel Time: 182 msec    TR Vmax:        303.00 cm/s MV E velocity: 67.70 cm/s MV A velocity: 85.70 cm/s  SHUNTS MV E/A ratio:  0.79        Systemic VTI:  0.16 m  Systemic Diam: 2.60 cm Vernell Leep MD Electronically signed by Vernell Leep MD Signature Date/Time: 09/21/2021/11:53:08 AM    Final (Updated)    CARDIAC CATHETERIZATION  Result Date: 09/21/2021 Images from the original result were not included. LM: Normal LAD: Tortuous. Rapid taper distally. No significant disease Lcx: Large, dominant, tortuous.        Distal 50% stenosis Ramus: Extreme tortuous takeoff. 95% stenosis RCA: Small, non-dominant. Mid 95% stenosis, best treated medically. Attempted to wire the vessel but low chance of successful intervention due to extreme tortuosity. Aborted attempt after 5 min. No complication noted. Recommend medical management. Nigel Mormon, MD Pager: [phone] Office: [phone]  CT Angio Chest/Abd/Pel for Dissection W and/or W/WO  Result Date: 09/20/2021 CLINICAL DATA:  Right chest pain, evaluate for dissection EXAM: CT ANGIOGRAPHY CHEST, ABDOMEN AND PELVIS TECHNIQUE: Non-contrast CT of the chest was initially obtained. Multidetector CT imaging through the chest, abdomen and pelvis was performed using the standard protocol during bolus administration of intravenous contrast. Multiplanar reconstructed images and MIPs were obtained and reviewed to evaluate the vascular anatomy. RADIATION DOSE REDUCTION: This exam was performed according to the departmental dose-optimization program which includes automated exposure control, adjustment of the mA and/or kV according to patient  size and/or use of iterative reconstruction technique. CONTRAST:  153m OMNIPAQUE IOHEXOL 350 MG/ML SOLN COMPARISON:  CT abdomen/pelvis dated 06/10/2021. CT chest dated 01/20/2021. FINDINGS: CTA CHEST FINDINGS Cardiovascular: On unenhanced CT, there is no evidence of intramural hematoma. Following contrast administration, there is preferential opacification of the thoracic aorta. No evidence of thoracic aortic aneurysm or dissection. Atherosclerotic calcifications of the aortic arch. Study is not tailored for evaluation of the pulmonary arteries. No evidence of central pulmonary embolism. Heart is normal in size. No pericardial effusion. Mild coronary atherosclerosis of the LAD. Mediastinum/Nodes: No suspicious mediastinal lymphadenopathy. Visualized thyroid is unremarkable. Lungs/Pleura: Moderate centrilobular and paraseptal emphysematous changes, upper lung predominant. No suspicious pulmonary nodules. Minimal bibasilar atelectasis. No focal consolidation. No pleural effusion or pneumothorax. Musculoskeletal: Right shoulder arthroplasty. Degenerative changes of the thoracic spine. No fracture is seen. Review of the MIP images confirms the above findings. CTA ABDOMEN AND PELVIS FINDINGS VASCULAR Aorta: No evidence abdominal aortic aneurysm or dissection. Atherosclerotic calcifications. Patent. Celiac: Patent. SMA: Patent. Atherosclerotic calcifications. Renals: Patent bilaterally. Atherosclerotic calcifications at the origin of the left renal artery. IMA: Patent. Inflow: Patent bilaterally. Atherosclerotic calcifications. Veins: Unremarkable. Review of the MIP images confirms the above findings. NON-VASCULAR Hepatobiliary: Liver is within normal limits. Status post cholecystectomy. No intrahepatic or extrahepatic ductal dilatation. Pancreas: Within normal limits. Spleen: Within normal limits. Adrenals/Urinary Tract: Adrenal glands are within normal limits. Bilateral renal cysts, measuring up to 6.1 cm in the  anterior left lower kidney (series 7/image 26). No hydronephrosis. Bladder is within normal limits. Stomach/Bowel: Stomach is within normal limits. No evidence of bowel obstruction. Normal appendix (series 7/image 198). Extensive sigmoid diverticulosis, without evidence of diverticulitis. Lymphatic: No suspicious abdominopelvic lymphadenopathy. Reproductive: Mild prostatomegaly. Other: No abdominopelvic ascites. Musculoskeletal: Degenerative changes of the lumbar spine. Review of the MIP images confirms the above findings. IMPRESSION: No evidence of thoracoabdominal aortic aneurysm or dissection. No evidence of acute cardiopulmonary disease. Extensive left colonic diverticulosis, without evidence of diverticulitis. Additional ancillary findings as above. Electronically Signed   By: SJulian HyM.D.   On: 09/20/2021 21:37   CT Head Wo Contrast  Result Date: 09/20/2021 CLINICAL DATA:  Near syncope, altered mental status EXAM: CT HEAD WITHOUT CONTRAST CT CERVICAL SPINE WITHOUT CONTRAST TECHNIQUE: Multidetector CT  imaging of the head and cervical spine was performed following the standard protocol without intravenous contrast. Multiplanar CT image reconstructions of the cervical spine were also generated. RADIATION DOSE REDUCTION: This exam was performed according to the departmental dose-optimization program which includes automated exposure control, adjustment of the mA and/or kV according to patient size and/or use of iterative reconstruction technique. COMPARISON:  01/20/2021 FINDINGS: CT HEAD FINDINGS Brain: No evidence of acute infarction, hemorrhage, hydrocephalus, extra-axial collection or mass lesion/mass effect. Subcortical white matter and periventricular small vessel ischemic changes. Mild chronic left hemispheric atrophy with prominent extra-axial CSF. Vascular: Mild intracranial atherosclerosis. Skull: Normal. Negative for fracture or focal lesion. Sinuses/Orbits: The visualized paranasal sinuses  are essentially clear. The mastoid air cells are unopacified. Other: None. CT CERVICAL SPINE FINDINGS Alignment: Reversal of the normal cervical lordosis. Skull base and vertebrae: No acute fracture. No primary bone lesion or focal pathologic process. Soft tissues and spinal canal: No prevertebral fluid or swelling. No visible canal hematoma. Disc levels: Mild to moderate degenerative changes of the mid cervical spine. Spinal canal is patent. Upper chest: Evaluated on dedicated CTA chest. Other: None. IMPRESSION: No evidence of acute intracranial abnormality. Atrophy with small vessel ischemic changes. No evidence of traumatic injury to the cervical spine. Mild to moderate degenerative changes. Electronically Signed   By: Julian Hy M.D.   On: 09/20/2021 21:27   CT Cervical Spine Wo Contrast  Result Date: 09/20/2021 CLINICAL DATA:  Near syncope, altered mental status EXAM: CT HEAD WITHOUT CONTRAST CT CERVICAL SPINE WITHOUT CONTRAST TECHNIQUE: Multidetector CT imaging of the head and cervical spine was performed following the standard protocol without intravenous contrast. Multiplanar CT image reconstructions of the cervical spine were also generated. RADIATION DOSE REDUCTION: This exam was performed according to the departmental dose-optimization program which includes automated exposure control, adjustment of the mA and/or kV according to patient size and/or use of iterative reconstruction technique. COMPARISON:  01/20/2021 FINDINGS: CT HEAD FINDINGS Brain: No evidence of acute infarction, hemorrhage, hydrocephalus, extra-axial collection or mass lesion/mass effect. Subcortical white matter and periventricular small vessel ischemic changes. Mild chronic left hemispheric atrophy with prominent extra-axial CSF. Vascular: Mild intracranial atherosclerosis. Skull: Normal. Negative for fracture or focal lesion. Sinuses/Orbits: The visualized paranasal sinuses are essentially clear. The mastoid air cells are  unopacified. Other: None. CT CERVICAL SPINE FINDINGS Alignment: Reversal of the normal cervical lordosis. Skull base and vertebrae: No acute fracture. No primary bone lesion or focal pathologic process. Soft tissues and spinal canal: No prevertebral fluid or swelling. No visible canal hematoma. Disc levels: Mild to moderate degenerative changes of the mid cervical spine. Spinal canal is patent. Upper chest: Evaluated on dedicated CTA chest. Other: None. IMPRESSION: No evidence of acute intracranial abnormality. Atrophy with small vessel ischemic changes. No evidence of traumatic injury to the cervical spine. Mild to moderate degenerative changes. Electronically Signed   By: Julian Hy M.D.   On: 09/20/2021 21:27   DG Chest Port 1 View  Result Date: 09/20/2021 CLINICAL DATA:  Chest pain. EXAM: PORTABLE CHEST 1 VIEW COMPARISON:  June 10, 2021. FINDINGS: The heart size and mediastinal contours are within normal limits. Both lungs are clear. Status post right shoulder arthroplasty. IMPRESSION: No active disease. Electronically Signed   By: Marijo Conception M.D.   On: 09/20/2021 18:45    Assessment & Plan:   Shivam was seen today for annual exam and anemia.  Diagnoses and all orders for this visit:  Benign essential HTN -  Basic metabolic panel; Future -     Basic metabolic panel -     Hepatic function panel; Future -     Hepatic function panel -     potassium chloride SA (KLOR-CON M20) 20 MEQ tablet; Take 1 tablet (20 mEq total) by mouth 2 (two) times daily.  CKD (chronic kidney disease), stage II -     Basic metabolic panel; Future -     Basic metabolic panel  Chronic iron deficiency anemia -     IBC + Ferritin; Future -     CBC with Differential/Platelet; Future -     CBC with Differential/Platelet -     IBC + Ferritin  Vitamin B12 deficiency anemia due to intrinsic factor deficiency -     CBC with Differential/Platelet; Future -     Folate; Future -     Folate -     CBC  with Differential/Platelet -     cyanocobalamin (VITAMIN B12) injection 1,000 mcg  Acute blood loss anemia -     CBC  Encounter for general adult medical examination with abnormal findings  Diuretic-induced hypokalemia -     potassium chloride SA (KLOR-CON M20) 20 MEQ tablet; Take 1 tablet (20 mEq total) by mouth 2 (two) times daily.  Other orders -     Zoster Vaccine Adjuvanted [facility]) injection; Inject 0.5 mLs into the muscle once for 1 dose. -     Tdap (BOOSTRIX) 5-2.5-18.5 LF-MCG/0.5 injection; Inject 0.5 mLs into the muscle once for 1 dose.   I have discontinued Shanon Brow L. Laprade's Tetrahydrozoline HCl (VISINE OP). I have also changed his Klor-Con M20 to potassium chloride SA. Additionally, I am having him start on Shingrix and Boostrix. Lastly, I am having him maintain his pantoprazole, OVER THE COUNTER MEDICATION, acetaminophen, clopidogrel, sacubitril-valsartan, nitroGLYCERIN, atorvastatin, aspirin EC, metoprolol succinate, ferrous sulfate, and oxyCODONE-acetaminophen. We administered cyanocobalamin.  Meds ordered this encounter  Medications   Zoster Vaccine Adjuvanted [facility]) injection    Sig: Inject 0.5 mLs into the muscle once for 1 dose.    Dispense:  0.5 mL    Refill:  1   Tdap (BOOSTRIX) 5-2.5-18.5 LF-MCG/0.5 injection    Sig: Inject 0.5 mLs into the muscle once for 1 dose.    Dispense:  0.5 mL    Refill:  0   cyanocobalamin (VITAMIN B12) injection 1,000 mcg   potassium chloride SA (KLOR-CON M20) 20 MEQ tablet    Sig: Take 1 tablet (20 mEq total) by mouth 2 (two) times daily.    Dispense:  180 tablet    Refill:  0     Follow-up: Return in about 3 months (around 03/28/2022).  Scarlette Calico, MD

## 2021-12-26 NOTE — Telephone Encounter (Signed)
Received call from lab that patient has a critical potassium of 2.8. Will route to MD.

## 2021-12-26 NOTE — Patient Instructions (Signed)
Health Maintenance, Male Adopting a healthy lifestyle and getting preventive care are important in promoting health and wellness. Ask your health care provider about: The right schedule for you to have regular tests and exams. Things you can do on your own to prevent diseases and keep yourself healthy. What should I know about diet, weight, and exercise? Eat a healthy diet  Eat a diet that includes plenty of vegetables, fruits, low-fat dairy products, and lean protein. Do not eat a lot of foods that are high in solid fats, added sugars, or sodium. Maintain a healthy weight Body mass index (BMI) is a measurement that can be used to identify possible weight problems. It estimates body fat based on height and weight. Your health care provider can help determine your BMI and help you achieve or maintain a healthy weight. Get regular exercise Get regular exercise. This is one of the most important things you can do for your health. Most adults should: Exercise for at least 150 minutes each week. The exercise should increase your heart rate and make you sweat (moderate-intensity exercise). Do strengthening exercises at least twice a week. This is in addition to the moderate-intensity exercise. Spend less time sitting. Even light physical activity can be beneficial. Watch cholesterol and blood lipids Have your blood tested for lipids and cholesterol at 80 years of age, then have this test every 5 years. You may need to have your cholesterol levels checked more often if: Your lipid or cholesterol levels are high. You are older than 80 years of age. You are at high risk for heart disease. What should I know about cancer screening? Many types of cancers can be detected early and may often be prevented. Depending on your health history and family history, you may need to have cancer screening at various ages. This may include screening for: Colorectal cancer. Prostate cancer. Skin cancer. Lung  cancer. What should I know about heart disease, diabetes, and high blood pressure? Blood pressure and heart disease High blood pressure causes heart disease and increases the risk of stroke. This is more likely to develop in people who have high blood pressure readings or are overweight. Talk with your health care provider about your target blood pressure readings. Have your blood pressure checked: Every 3-5 years if you are 18-39 years of age. Every year if you are 40 years old or older. If you are between the ages of 65 and 75 and are a current or former smoker, ask your health care provider if you should have a one-time screening for abdominal aortic aneurysm (AAA). Diabetes Have regular diabetes screenings. This checks your fasting blood sugar level. Have the screening done: Once every three years after age 45 if you are at a normal weight and have a low risk for diabetes. More often and at a younger age if you are overweight or have a high risk for diabetes. What should I know about preventing infection? Hepatitis B If you have a higher risk for hepatitis B, you should be screened for this virus. Talk with your health care provider to find out if you are at risk for hepatitis B infection. Hepatitis C Blood testing is recommended for: Everyone born from 1945 through 1965. Anyone with known risk factors for hepatitis C. Sexually transmitted infections (STIs) You should be screened each year for STIs, including gonorrhea and chlamydia, if: You are sexually active and are younger than 80 years of age. You are older than 80 years of age and your   health care provider tells you that you are at risk for this type of infection. Your sexual activity has changed since you were last screened, and you are at increased risk for chlamydia or gonorrhea. Ask your health care provider if you are at risk. Ask your health care provider about whether you are at high risk for HIV. Your health care provider  may recommend a prescription medicine to help prevent HIV infection. If you choose to take medicine to prevent HIV, you should first get tested for HIV. You should then be tested every 3 months for as long as you are taking the medicine. Follow these instructions at home: Alcohol use Do not drink alcohol if your health care provider tells you not to drink. If you drink alcohol: Limit how much you have to 0-2 drinks a day. Know how much alcohol is in your drink. In the [place] one drink equals one 12 oz bottle of beer (355 mL), one 5 oz glass of wine (148 mL), or one 1 oz glass of hard liquor (44 mL). Lifestyle Do not use any products that contain nicotine or tobacco. These products include cigarettes, chewing tobacco, and vaping devices, such as e-cigarettes. If you need help quitting, ask your health care provider. Do not use street drugs. Do not share needles. Ask your health care provider for help if you need support or information about quitting drugs. General instructions Schedule regular health, dental, and eye exams. Stay current with your vaccines. Tell your health care provider if: You often feel depressed. You have ever been abused or do not feel safe at home. Summary Adopting a healthy lifestyle and getting preventive care are important in promoting health and wellness. Follow your health care provider's instructions about healthy diet, exercising, and getting tested or screened for diseases. Follow your health care provider's instructions on monitoring your cholesterol and blood pressure. This information is not intended to replace advice given to you by your health care provider. Make sure you discuss any questions you have with your health care provider. Document Revised: 09/12/2020 Document Reviewed: 09/12/2020 [employer] Patient Education  2023 [employer]  

## 2021-12-26 NOTE — Telephone Encounter (Signed)
I don't recall ordering this blood work. He saw Dr. Ronnald Ramp today and I suspect he ordered so please forward to him.

## 2022-01-19 ENCOUNTER — Telehealth: Payer: Self-pay | Admitting: Internal Medicine

## 2022-01-19 NOTE — Telephone Encounter (Signed)
Patient needs a refill on his oxycodone - please send to [facility]

## 2022-01-22 ENCOUNTER — Other Ambulatory Visit: Payer: Self-pay | Admitting: Internal Medicine

## 2022-01-22 DIAGNOSIS — M159 Polyosteoarthritis, unspecified: Secondary | ICD-10-CM

## 2022-01-22 DIAGNOSIS — M5416 Radiculopathy, lumbar region: Secondary | ICD-10-CM

## 2022-01-22 MED ORDER — OXYCODONE-ACETAMINOPHEN 10-325 MG PO TABS: 1.0000 | ORAL_TABLET | Freq: Four times a day (QID) | ORAL | 0 refills | Status: DC | PRN

## 2022-01-26 ENCOUNTER — Other Ambulatory Visit: Payer: Self-pay | Admitting: Internal Medicine

## 2022-01-26 DIAGNOSIS — I10 Essential (primary) hypertension: Secondary | ICD-10-CM

## 2022-01-29 ENCOUNTER — Other Ambulatory Visit (HOSPITAL_BASED_OUTPATIENT_CLINIC_OR_DEPARTMENT_OTHER): Payer: Self-pay

## 2022-01-30 ENCOUNTER — Telehealth: Payer: Self-pay | Admitting: Internal Medicine

## 2022-01-30 NOTE — Telephone Encounter (Signed)
Patient needs refill on his atorvastatin - please send to [facility] - patient states that he thinks this is his blood pressure pill - not sure

## 2022-01-31 MED ORDER — METOPROLOL SUCCINATE ER 25 MG PO TB24: 12.5000 mg | ORAL_TABLET | Freq: Every day | ORAL | 2 refills | Status: DC

## 2022-01-31 NOTE — Telephone Encounter (Signed)
Pt's BP Rx for metoprolol succinate (TOPROL-XL) has been sent in

## 2022-02-19 ENCOUNTER — Telehealth: Payer: Self-pay | Admitting: Internal Medicine

## 2022-02-19 DIAGNOSIS — M159 Polyosteoarthritis, unspecified: Secondary | ICD-10-CM

## 2022-02-19 DIAGNOSIS — M5416 Radiculopathy, lumbar region: Secondary | ICD-10-CM

## 2022-02-19 MED ORDER — OXYCODONE-ACETAMINOPHEN 10-325 MG PO TABS: 1.0000 | ORAL_TABLET | Freq: Four times a day (QID) | ORAL | 0 refills | Status: DC | PRN

## 2022-02-19 NOTE — Telephone Encounter (Signed)
Done erx 

## 2022-02-19 NOTE — Telephone Encounter (Signed)
Patient needs his oxy codone refiled - please send to [facility] - at [place] appointment in August, 2023

## 2022-03-13 ENCOUNTER — Other Ambulatory Visit: Payer: Self-pay | Admitting: Internal Medicine

## 2022-03-13 DIAGNOSIS — I10 Essential (primary) hypertension: Secondary | ICD-10-CM

## 2022-03-21 ENCOUNTER — Telehealth: Payer: Self-pay | Admitting: Internal Medicine

## 2022-03-21 DIAGNOSIS — M47816 Spondylosis without myelopathy or radiculopathy, lumbar region: Secondary | ICD-10-CM | POA: Diagnosis not present

## 2022-03-21 NOTE — Telephone Encounter (Signed)
Patient would like his oxycodone refilled - Please send to [facility].  Next appointment:  04/04/2022  Last appointment:  12/26/2021

## 2022-03-22 ENCOUNTER — Other Ambulatory Visit: Payer: Self-pay | Admitting: Internal Medicine

## 2022-03-22 DIAGNOSIS — M5416 Radiculopathy, lumbar region: Secondary | ICD-10-CM

## 2022-03-22 DIAGNOSIS — M159 Polyosteoarthritis, unspecified: Secondary | ICD-10-CM

## 2022-03-22 MED ORDER — OXYCODONE-ACETAMINOPHEN 10-325 MG PO TABS: 1.0000 | ORAL_TABLET | Freq: Four times a day (QID) | ORAL | 0 refills | Status: DC | PRN

## 2022-03-25 ENCOUNTER — Other Ambulatory Visit: Payer: Self-pay | Admitting: Internal Medicine

## 2022-03-25 DIAGNOSIS — E876 Hypokalemia: Secondary | ICD-10-CM

## 2022-03-25 DIAGNOSIS — I10 Essential (primary) hypertension: Secondary | ICD-10-CM

## 2022-04-04 ENCOUNTER — Ambulatory Visit (INDEPENDENT_AMBULATORY_CARE_PROVIDER_SITE_OTHER): Payer: Medicare Other | Admitting: Internal Medicine

## 2022-04-04 ENCOUNTER — Encounter: Payer: Self-pay | Admitting: Internal Medicine

## 2022-04-04 VITALS — BP 148/78 | HR 56 | Temp 98.1°F | Resp 16 | Wt 177.8 lb

## 2022-04-04 DIAGNOSIS — R7303 Prediabetes: Secondary | ICD-10-CM | POA: Diagnosis not present

## 2022-04-04 DIAGNOSIS — E876 Hypokalemia: Secondary | ICD-10-CM

## 2022-04-04 DIAGNOSIS — N182 Chronic kidney disease, stage 2 (mild): Secondary | ICD-10-CM | POA: Diagnosis not present

## 2022-04-04 DIAGNOSIS — I10 Essential (primary) hypertension: Secondary | ICD-10-CM | POA: Diagnosis not present

## 2022-04-04 DIAGNOSIS — D509 Iron deficiency anemia, unspecified: Secondary | ICD-10-CM

## 2022-04-04 LAB — BASIC METABOLIC PANEL
BUN: 18 mg/dL (ref 6–23)
CO2: 29 mEq/L (ref 19–32)
Calcium: 9.4 mg/dL (ref 8.4–10.5)
Chloride: 104 mEq/L (ref 96–112)
Creatinine, Ser: 1.16 mg/dL (ref 0.40–1.50)
GFR: 59.32 mL/min — ABNORMAL LOW (ref >60.00)
Glucose, Bld: 93 mg/dL (ref 70–99)
Potassium: 4.4 mEq/L (ref 3.5–5.1)
Sodium: 138 mEq/L (ref 135–145)

## 2022-04-04 LAB — CBC WITH DIFFERENTIAL/PLATELET
Basophils Absolute: 0 10*3/uL (ref 0.0–0.1)
Basophils Relative: 0.4 % (ref 0.0–3.0)
Eosinophils Absolute: 0.3 10*3/uL (ref 0.0–0.7)
Eosinophils Relative: 3.3 % (ref 0.0–5.0)
HCT: 40.4 % (ref 39.0–52.0)
Hemoglobin: 13.6 g/dL (ref 13.0–17.0)
Lymphocytes Relative: 23.8 % (ref 12.0–46.0)
Lymphs Abs: 2.3 10*3/uL (ref 0.7–4.0)
MCHC: 33.7 g/dL (ref 30.0–36.0)
MCV: 94.6 fl (ref 78.0–100.0)
Monocytes Absolute: 0.8 10*3/uL (ref 0.1–1.0)
Monocytes Relative: 8.3 % (ref 3.0–12.0)
Neutro Abs: 6.3 10*3/uL (ref 1.4–7.7)
Neutrophils Relative %: 64.2 % (ref 43.0–77.0)
Platelets: 361 10*3/uL (ref 150.0–400.0)
RBC: 4.27 Mil/uL (ref 4.22–5.81)
RDW: 15.8 % — ABNORMAL HIGH (ref 11.5–15.5)
WBC: 9.8 10*3/uL (ref 4.0–10.5)

## 2022-04-04 LAB — IBC + FERRITIN
Ferritin: 23.7 ng/mL (ref 22.0–322.0)
Iron: 50 ug/dL (ref 42–165)
Saturation Ratios: 14.2 % — ABNORMAL LOW (ref 20.0–50.0)
TIBC: 351.4 ug/dL (ref 250.0–450.0)
Transferrin: 251 mg/dL (ref 212.0–360.0)

## 2022-04-04 LAB — MAGNESIUM: Magnesium: 2.1 mg/dL (ref 1.5–2.5)

## 2022-04-04 NOTE — Progress Notes (Signed)
Subjective:  Patient ID: Adrian Miller, male    DOB: August 15, 1940  Age: 81 y.o. MRN: [id number]  CC: Hypertension and Anemia   HPI Adrian Miller presents for f/up -   He is active and denies chest pain, shortness of breath, diaphoresis, or edema.  Outpatient Medications Prior to Visit  Medication Sig Dispense Refill   aspirin EC 81 MG tablet Take 1 tablet (81 mg total) by mouth daily. (Patient not taking: Reported on 12/25/2021) 30 tablet 2   atorvastatin (LIPITOR) 40 MG tablet Take 1 tablet (40 mg total) by mouth daily. 30 tablet 2   clopidogrel (PLAVIX) 75 MG tablet Take 1 tablet (75 mg total) by mouth daily. 30 tablet 2   ferrous sulfate 325 (65 FE) MG tablet Take 1 tablet (325 mg total) by mouth every morning. 90 tablet 1   KLOR-CON M20 20 MEQ tablet TAKE 1 TABLET BY MOUTH TWICE A DAY 180 tablet 0   metoprolol succinate (TOPROL-XL) 25 MG 24 hr tablet Take 0.5 tablets (12.5 mg total) by mouth daily. 30 tablet 2   nitroGLYCERIN (NITROSTAT) 0.4 MG SL tablet Place 1 tablet (0.4 mg total) under the tongue every 5 (five) minutes as needed for chest pain. 30 tablet 2   oxyCODONE-acetaminophen (PERCOCET) 10-325 MG tablet Take 1 tablet by mouth every 6 (six) hours as needed for pain. 100 tablet 0   pantoprazole (PROTONIX) 40 MG tablet TAKE 1 TABLET BY MOUTH EVERY DAY (Patient taking differently: 40 mg every morning.) 90 tablet 0   sacubitril-valsartan (ENTRESTO) 24-26 MG Take 1 tablet by mouth 2 (two) times daily. 60 tablet 2   OVER THE COUNTER MEDICATION Take 1 tablet by mouth at bedtime as needed (frequent urination). Super B Prostate     No facility-administered medications prior to visit.    ROS Review of Systems  Constitutional: Negative.  Negative for diaphoresis and fatigue.  HENT: Negative.    Eyes: Negative.   Respiratory:  Negative for apnea, shortness of breath and wheezing.   Cardiovascular:  Negative for chest pain, palpitations and leg swelling.  Gastrointestinal:  Negative  for abdominal pain, diarrhea, nausea and vomiting.  Endocrine: Negative.   Genitourinary: Negative.   Musculoskeletal:  Positive for arthralgias, back pain and neck pain. Negative for joint swelling and myalgias.  Skin: Negative.   Neurological:  Negative for dizziness and weakness.  Hematological:  Negative for adenopathy. Does not bruise/bleed easily.  Psychiatric/Behavioral: Negative.      Objective:  BP (!) 148/78 (BP Location: Left Arm, Patient Position: Sitting, Cuff Size: Normal)   Pulse (!) 56   Temp 98.1 F (36.7 C) (Oral)   Resp 16   Wt 177 lb 12.8 oz (80.6 kg)   SpO2 93%   BMI 26.26 kg/m   BP Readings from Last 3 Encounters:  04/04/22 (!) 148/78  12/26/21 118/66  09/23/21 107/65    Wt Readings from Last 3 Encounters:  04/04/22 177 lb 12.8 oz (80.6 kg)  12/26/21 168 lb (76.2 kg)  09/23/21 178 lb 5.6 oz (80.9 kg)    Physical Exam Vitals reviewed.  HENT:     Mouth/Throat:     Mouth: Mucous membranes are moist.  Eyes:     General: No scleral icterus.    Conjunctiva/sclera: Conjunctivae normal.  Cardiovascular:     Rate and Rhythm: Normal rate and regular rhythm.     Heart sounds: No murmur heard. Pulmonary:     Effort: Pulmonary effort is normal.  Breath sounds: No stridor. No wheezing, rhonchi or rales.  Abdominal:     General: Abdomen is flat.     Palpations: There is no mass.     Tenderness: There is no abdominal tenderness. There is no guarding.     Hernia: No hernia is present.  Musculoskeletal:        General: Normal range of motion.     Cervical back: Neck supple.     Right lower leg: No edema.     Left lower leg: No edema.  Lymphadenopathy:     Cervical: No cervical adenopathy.  Skin:    General: Skin is warm and dry.  Neurological:     General: No focal deficit present.     Mental Status: He is alert.  Psychiatric:        Mood and Affect: Mood normal.        Behavior: Behavior normal.     Lab Results  Component Value Date    WBC 9.8 04/04/2022   HGB 13.6 04/04/2022   HCT 40.4 04/04/2022   PLT 361.0 04/04/2022   GLUCOSE 93 04/04/2022   CHOL 110 09/20/2021   TRIG 106 09/20/2021   HDL 33 (L) 09/20/2021   LDLDIRECT 47.0 10/22/2019   LDLCALC 56 09/20/2021   ALT 15 12/26/2021   AST 18 12/26/2021   NA 138 04/04/2022   K 4.4 04/04/2022   CL 104 04/04/2022   CREATININE 1.16 04/04/2022   BUN 18 04/04/2022   CO2 29 04/04/2022   TSH 0.486 09/21/2021   PSA 0.87 03/18/2014   INR 1.2 09/20/2021   HGBA1C 5.7 (H) 09/20/2021    ECHOCARDIOGRAM COMPLETE  Result Date: 09/22/2021    ECHOCARDIOGRAM REPORT   Patient Name:   Adrian Miller Date of Exam: 09/21/2021 Medical Rec #:  [id number]     Height:       69.0 in Accession #:    [id number]    Weight:       180.0 lb Date of Birth:  August 19, 1940      BSA:          1.976 m Patient Age:    36 years      BP:           143/86 mmHg Patient Gender: M             HR:           56 bpm. Exam Location:  Inpatient Procedure: 2D Echo, Cardiac Doppler, Color Doppler and Intracardiac            Opacification Agent                                 MODIFIED REPORT: This report was modified by Vernell Leep MD on 09/22/2021 due to Correction.  Indications:     122-I22.9 Subsequent ST elevation (STEM) and non-ST elevation                  (NSTEMI) myocardial infarction  History:         Patient has prior history of Echocardiogram examinations, most                  recent 06/01/2015. CHF, Acute MI, Abnormal ECG,                  Arrythmias:Bradycardia; Risk Factors:Hypertension and Current  Smoker.  Sonographer:     Roseanna Rainbow RDCS Referring Phys:  [id number] Rhetta Mura Diagnosing Phys: Vernell Leep MD  Sonographer Comments: Technically difficult study due to poor echo windows, suboptimal parasternal window and suboptimal apical window. Image acquisition challenging due to patient body habitus. Attempted to turn, very low parasternal. IMPRESSIONS  1. Left ventricular ejection  fraction, by estimation, is 35 to 40%. The left ventricle has normal function. The left ventricle demonstrates regional wall motion abnormalities (see scoring diagram/findings for description). Left ventricular diastolic parameters are consistent with Grade I diastolic dysfunction (impaired relaxation). There is moderate hypokinesis of the left ventricular, mid-apical anteroseptal wall, inferolateral wall and apical segment.  2. Right ventricular systolic function is normal. The right ventricular size is normal. There is mildly elevated pulmonary artery systolic pressure 40 mmHg.  3. The mitral valve is normal in structure. No evidence of mitral valve regurgitation. No evidence of mitral stenosis.  4. Tricuspid valve regurgitation is mild to moderate.  5. The aortic valve is calcified. Aortic valve regurgitation is mild. No aortic stenosis is present. FINDINGS  Left Ventricle: Left ventricular ejection fraction, by estimation, is 35 to 40%. The left ventricle has normal function. The left ventricle demonstrates regional wall motion abnormalities. Moderate hypokinesis of the left ventricular, mid-apical anteroseptal wall, inferolateral wall and apical segment. Definity contrast agent was given IV to delineate the left ventricular endocardial borders. The left ventricular internal cavity size was normal in size. There is no left ventricular hypertrophy. Left ventricular diastolic parameters are consistent with Grade I diastolic dysfunction (impaired relaxation). Normal left ventricular filling pressure. Right Ventricle: The right ventricular size is normal. No increase in right ventricular wall thickness. Right ventricular systolic function is normal. There is mildly elevated pulmonary artery systolic pressure. The tricuspid regurgitant velocity is 3.03  m/s, and with an assumed right atrial pressure of 3 mmHg, the estimated right ventricular systolic pressure is 63.0 mmHg. Left Atrium: Left atrial size was normal in  size. Right Atrium: Right atrial size was normal in size. Pericardium: There is no evidence of pericardial effusion. Mitral Valve: The mitral valve is normal in structure. No evidence of mitral valve regurgitation. No evidence of mitral valve stenosis. Tricuspid Valve: The tricuspid valve is normal in structure. Tricuspid valve regurgitation is mild to moderate. Aortic Valve: The aortic valve is calcified. Aortic valve regurgitation is mild. No aortic stenosis is present. Pulmonic Valve: The pulmonic valve was normal in structure. Pulmonic valve regurgitation is not visualized. No evidence of pulmonic stenosis. Aorta: The aortic root is normal in size and structure. IAS/Shunts: The interatrial septum was not assessed.  LEFT VENTRICLE PLAX 2D LVIDd:         4.80 cm      Diastology LVIDs:         3.60 cm      LV e' medial:    5.98 cm/s LV PW:         1.00 cm      LV E/e' medial:  11.3 LV IVS:        1.00 cm      LV e' lateral:   6.31 cm/s LVOT diam:     2.60 cm      LV E/e' lateral: 10.7 LV SV:         83 LV SV Index:   42 LVOT Area:     5.31 cm  LV Volumes (MOD) LV vol d, MOD A2C: 99.4 ml LV vol d, MOD A4C: 114.0 ml  LV vol s, MOD A2C: 61.8 ml LV vol s, MOD A4C: 68.5 ml LV SV MOD A2C:     37.6 ml LV SV MOD A4C:     114.0 ml LV SV MOD BP:      45.8 ml RIGHT VENTRICLE             IVC RV S prime:     14.70 cm/s  IVC diam: 1.90 cm TAPSE (M-mode): 2.4 cm LEFT ATRIUM             Index        RIGHT ATRIUM           Index LA diam:        3.40 cm 1.72 cm/m   RA Area:     13.50 cm LA Vol (A2C):   33.0 ml 16.70 ml/m  RA Volume:   32.70 ml  16.55 ml/m LA Vol (A4C):   26.4 ml 13.36 ml/m LA Biplane Vol: 32.2 ml 16.30 ml/m  AORTIC VALVE LVOT Vmax:   79.60 cm/s LVOT Vmean:  49.900 cm/s LVOT VTI:    0.157 m  AORTA Ao Root diam: 3.40 cm MITRAL VALVE               TRICUSPID VALVE MV Area (PHT): 4.17 cm    TR Peak grad:   36.7 mmHg MV Decel Time: 182 msec    TR Vmax:        303.00 cm/s MV E velocity: 67.70 cm/s MV A velocity:  85.70 cm/s  SHUNTS MV E/A ratio:  0.79        Systemic VTI:  0.16 m                            Systemic Diam: 2.60 cm Vernell Leep MD Electronically signed by Vernell Leep MD Signature Date/Time: 09/21/2021/11:53:08 AM    Final (Updated)    CARDIAC CATHETERIZATION  Result Date: 09/21/2021 Images from the original result were not included. LM: Normal LAD: Tortuous. Rapid taper distally. No significant disease Lcx: Large, dominant, tortuous.        Distal 50% stenosis Ramus: Extreme tortuous takeoff. 95% stenosis RCA: Small, non-dominant. Mid 95% stenosis, best treated medically. Attempted to wire the vessel but low chance of successful intervention due to extreme tortuosity. Aborted attempt after 5 min. No complication noted. Recommend medical management. Nigel Mormon, MD Pager: [phone] Office: [phone]  CT Angio Chest/Abd/Pel for Dissection W and/or W/WO  Result Date: 09/20/2021 CLINICAL DATA:  Right chest pain, evaluate for dissection EXAM: CT ANGIOGRAPHY CHEST, ABDOMEN AND PELVIS TECHNIQUE: Non-contrast CT of the chest was initially obtained. Multidetector CT imaging through the chest, abdomen and pelvis was performed using the standard protocol during bolus administration of intravenous contrast. Multiplanar reconstructed images and MIPs were obtained and reviewed to evaluate the vascular anatomy. RADIATION DOSE REDUCTION: This exam was performed according to the departmental dose-optimization program which includes automated exposure control, adjustment of the mA and/or kV according to patient size and/or use of iterative reconstruction technique. CONTRAST:  172m OMNIPAQUE IOHEXOL 350 MG/ML SOLN COMPARISON:  CT abdomen/pelvis dated 06/10/2021. CT chest dated 01/20/2021. FINDINGS: CTA CHEST FINDINGS Cardiovascular: On unenhanced CT, there is no evidence of intramural hematoma. Following contrast administration, there is preferential opacification of the thoracic aorta. No  evidence of thoracic aortic aneurysm or dissection. Atherosclerotic calcifications of the aortic arch. Study is not tailored for evaluation of the pulmonary arteries. No evidence of central  pulmonary embolism. Heart is normal in size. No pericardial effusion. Mild coronary atherosclerosis of the LAD. Mediastinum/Nodes: No suspicious mediastinal lymphadenopathy. Visualized thyroid is unremarkable. Lungs/Pleura: Moderate centrilobular and paraseptal emphysematous changes, upper lung predominant. No suspicious pulmonary nodules. Minimal bibasilar atelectasis. No focal consolidation. No pleural effusion or pneumothorax. Musculoskeletal: Right shoulder arthroplasty. Degenerative changes of the thoracic spine. No fracture is seen. Review of the MIP images confirms the above findings. CTA ABDOMEN AND PELVIS FINDINGS VASCULAR Aorta: No evidence abdominal aortic aneurysm or dissection. Atherosclerotic calcifications. Patent. Celiac: Patent. SMA: Patent. Atherosclerotic calcifications. Renals: Patent bilaterally. Atherosclerotic calcifications at the origin of the left renal artery. IMA: Patent. Inflow: Patent bilaterally. Atherosclerotic calcifications. Veins: Unremarkable. Review of the MIP images confirms the above findings. NON-VASCULAR Hepatobiliary: Liver is within normal limits. Status post cholecystectomy. No intrahepatic or extrahepatic ductal dilatation. Pancreas: Within normal limits. Spleen: Within normal limits. Adrenals/Urinary Tract: Adrenal glands are within normal limits. Bilateral renal cysts, measuring up to 6.1 cm in the anterior left lower kidney (series 7/image 26). No hydronephrosis. Bladder is within normal limits. Stomach/Bowel: Stomach is within normal limits. No evidence of bowel obstruction. Normal appendix (series 7/image 198). Extensive sigmoid diverticulosis, without evidence of diverticulitis. Lymphatic: No suspicious abdominopelvic lymphadenopathy. Reproductive: Mild prostatomegaly. Other: No  abdominopelvic ascites. Musculoskeletal: Degenerative changes of the lumbar spine. Review of the MIP images confirms the above findings. IMPRESSION: No evidence of thoracoabdominal aortic aneurysm or dissection. No evidence of acute cardiopulmonary disease. Extensive left colonic diverticulosis, without evidence of diverticulitis. Additional ancillary findings as above. Electronically Signed   By: Julian Hy M.D.   On: 09/20/2021 21:37   CT Head Wo Contrast  Result Date: 09/20/2021 CLINICAL DATA:  Near syncope, altered mental status EXAM: CT HEAD WITHOUT CONTRAST CT CERVICAL SPINE WITHOUT CONTRAST TECHNIQUE: Multidetector CT imaging of the head and cervical spine was performed following the standard protocol without intravenous contrast. Multiplanar CT image reconstructions of the cervical spine were also generated. RADIATION DOSE REDUCTION: This exam was performed according to the departmental dose-optimization program which includes automated exposure control, adjustment of the mA and/or kV according to patient size and/or use of iterative reconstruction technique. COMPARISON:  01/20/2021 FINDINGS: CT HEAD FINDINGS Brain: No evidence of acute infarction, hemorrhage, hydrocephalus, extra-axial collection or mass lesion/mass effect. Subcortical white matter and periventricular small vessel ischemic changes. Mild chronic left hemispheric atrophy with prominent extra-axial CSF. Vascular: Mild intracranial atherosclerosis. Skull: Normal. Negative for fracture or focal lesion. Sinuses/Orbits: The visualized paranasal sinuses are essentially clear. The mastoid air cells are unopacified. Other: None. CT CERVICAL SPINE FINDINGS Alignment: Reversal of the normal cervical lordosis. Skull base and vertebrae: No acute fracture. No primary bone lesion or focal pathologic process. Soft tissues and spinal canal: No prevertebral fluid or swelling. No visible canal hematoma. Disc levels: Mild to moderate degenerative  changes of the mid cervical spine. Spinal canal is patent. Upper chest: Evaluated on dedicated CTA chest. Other: None. IMPRESSION: No evidence of acute intracranial abnormality. Atrophy with small vessel ischemic changes. No evidence of traumatic injury to the cervical spine. Mild to moderate degenerative changes. Electronically Signed   By: Julian Hy M.D.   On: 09/20/2021 21:27   CT Cervical Spine Wo Contrast  Result Date: 09/20/2021 CLINICAL DATA:  Near syncope, altered mental status EXAM: CT HEAD WITHOUT CONTRAST CT CERVICAL SPINE WITHOUT CONTRAST TECHNIQUE: Multidetector CT imaging of the head and cervical spine was performed following the standard protocol without intravenous contrast. Multiplanar CT image reconstructions of the cervical spine were  also generated. RADIATION DOSE REDUCTION: This exam was performed according to the departmental dose-optimization program which includes automated exposure control, adjustment of the mA and/or kV according to patient size and/or use of iterative reconstruction technique. COMPARISON:  01/20/2021 FINDINGS: CT HEAD FINDINGS Brain: No evidence of acute infarction, hemorrhage, hydrocephalus, extra-axial collection or mass lesion/mass effect. Subcortical white matter and periventricular small vessel ischemic changes. Mild chronic left hemispheric atrophy with prominent extra-axial CSF. Vascular: Mild intracranial atherosclerosis. Skull: Normal. Negative for fracture or focal lesion. Sinuses/Orbits: The visualized paranasal sinuses are essentially clear. The mastoid air cells are unopacified. Other: None. CT CERVICAL SPINE FINDINGS Alignment: Reversal of the normal cervical lordosis. Skull base and vertebrae: No acute fracture. No primary bone lesion or focal pathologic process. Soft tissues and spinal canal: No prevertebral fluid or swelling. No visible canal hematoma. Disc levels: Mild to moderate degenerative changes of the mid cervical spine. Spinal canal is  patent. Upper chest: Evaluated on dedicated CTA chest. Other: None. IMPRESSION: No evidence of acute intracranial abnormality. Atrophy with small vessel ischemic changes. No evidence of traumatic injury to the cervical spine. Mild to moderate degenerative changes. Electronically Signed   By: Julian Hy M.D.   On: 09/20/2021 21:27   DG Chest Port 1 View  Result Date: 09/20/2021 CLINICAL DATA:  Chest pain. EXAM: PORTABLE CHEST 1 VIEW COMPARISON:  June 10, 2021. FINDINGS: The heart size and mediastinal contours are within normal limits. Both lungs are clear. Status post right shoulder arthroplasty. IMPRESSION: No active disease. Electronically Signed   By: Marijo Conception M.D.   On: 09/20/2021 18:45    Assessment & Plan:   Fallou was seen today for hypertension and anemia.  Diagnoses and all orders for this visit:  Benign essential HTN- His blood pressure is well-controlled. -     Basic metabolic panel; Future -     Basic metabolic panel  CKD (chronic kidney disease), stage II- His renal function is stable. -     Basic metabolic panel; Future -     Basic metabolic panel  Chronic iron deficiency anemia- H&H and iron are normal. -     CBC with Differential/Platelet; Future -     IBC + Ferritin; Future -     IBC + Ferritin -     CBC with Differential/Platelet  Chronic hypokalemia- Potassium is normal. -     Basic metabolic panel; Future -     Magnesium; Future -     Magnesium -     Basic metabolic panel  Prediabetes -     HM Diabetes Foot Exam   I have discontinued Shanon Brow L. Lard's OVER THE COUNTER MEDICATION. I am also having him maintain his pantoprazole, clopidogrel, sacubitril-valsartan, nitroGLYCERIN, atorvastatin, aspirin EC, ferrous sulfate, metoprolol succinate, oxyCODONE-acetaminophen, and Klor-Con M20.  No orders of the defined types were placed in this encounter.    Follow-up: Return in about 3 months (around 07/05/2022).  Scarlette Calico, MD

## 2022-04-04 NOTE — Patient Instructions (Signed)
Hypertension, Adult High blood pressure (hypertension) is when the force of blood pumping through the arteries is too strong. The arteries are the blood vessels that carry blood from the heart throughout the body. Hypertension forces the heart to work harder to pump blood and may cause arteries to become narrow or stiff. Untreated or uncontrolled hypertension can lead to a heart attack, heart failure, a stroke, kidney disease, and other problems. A blood pressure reading consists of a higher number over a lower number. Ideally, your blood pressure should be below 120/80. The first ("top") number is called the systolic pressure. It is a measure of the pressure in your arteries as your heart beats. The second ("bottom") number is called the diastolic pressure. It is a measure of the pressure in your arteries as the heart relaxes. What are the causes? The exact cause of this condition is not known. There are some conditions that result in high blood pressure. What increases the risk? Certain factors may make you more likely to develop high blood pressure. Some of these risk factors are under your control, including: Smoking. Not getting enough exercise or physical activity. Being overweight. Having too much fat, sugar, calories, or salt (sodium) in your diet. Drinking too much alcohol. Other risk factors include: Having a personal history of heart disease, diabetes, high cholesterol, or kidney disease. Stress. Having a family history of high blood pressure and high cholesterol. Having obstructive sleep apnea. Age. The risk increases with age. What are the signs or symptoms? High blood pressure may not cause symptoms. Very high blood pressure (hypertensive crisis) may cause: Headache. Fast or irregular heartbeats (palpitations). Shortness of breath. Nosebleed. Nausea and vomiting. Vision changes. Severe chest pain, dizziness, and seizures. How is this diagnosed? This condition is diagnosed by  measuring your blood pressure while you are seated, with your arm resting on a flat surface, your legs uncrossed, and your feet flat on the floor. The cuff of the blood pressure monitor will be placed directly against the skin of your upper arm at the level of your heart. Blood pressure should be measured at least twice using the same arm. Certain conditions can cause a difference in blood pressure between your right and left arms. If you have a high blood pressure reading during one visit or you have normal blood pressure with other risk factors, you may be asked to: Return on a different day to have your blood pressure checked again. Monitor your blood pressure at home for 1 week or longer. If you are diagnosed with hypertension, you may have other blood or imaging tests to help your health care provider understand your overall risk for other conditions. How is this treated? This condition is treated by making healthy lifestyle changes, such as eating healthy foods, exercising more, and reducing your alcohol intake. You may be referred for counseling on a healthy diet and physical activity. Your health care provider may prescribe medicine if lifestyle changes are not enough to get your blood pressure under control and if: Your systolic blood pressure is above 130. Your diastolic blood pressure is above 80. Your personal target blood pressure may vary depending on your medical conditions, your age, and other factors. Follow these instructions at home: Eating and drinking  Eat a diet that is high in fiber and potassium, and low in sodium, added sugar, and fat. An example of this eating plan is called the DASH diet. DASH stands for Dietary Approaches to Stop Hypertension. To eat this way: Eat   plenty of fresh fruits and vegetables. Try to fill one half of your plate at each meal with fruits and vegetables. Eat whole grains, such as whole-wheat pasta, brown rice, or whole-grain bread. Fill about one  fourth of your plate with whole grains. Eat or drink low-fat dairy products, such as skim milk or low-fat yogurt. Avoid fatty cuts of meat, processed or cured meats, and poultry with skin. Fill about one fourth of your plate with lean proteins, such as fish, chicken without skin, beans, eggs, or tofu. Avoid pre-made and processed foods. These tend to be higher in sodium, added sugar, and fat. Reduce your daily sodium intake. Many people with hypertension should eat less than 1,500 mg of sodium a day. Do not drink alcohol if: Your health care provider tells you not to drink. You are pregnant, may be pregnant, or are planning to become pregnant. If you drink alcohol: Limit how much you have to: 0-1 drink a day for women. 0-2 drinks a day for men. Know how much alcohol is in your drink. In the [place] one drink equals one 12 oz bottle of beer (355 mL), one 5 oz glass of wine (148 mL), or one 1 oz glass of hard liquor (44 mL). Lifestyle  Work with your health care provider to maintain a healthy body weight or to lose weight. Ask what an ideal weight is for you. Get at least 30 minutes of exercise that causes your heart to beat faster (aerobic exercise) most days of the week. Activities may include walking, swimming, or biking. Include exercise to strengthen your muscles (resistance exercise), such as Pilates or lifting weights, as part of your weekly exercise routine. Try to do these types of exercises for 30 minutes at least 3 days a week. Do not use any products that contain nicotine or tobacco. These products include cigarettes, chewing tobacco, and vaping devices, such as e-cigarettes. If you need help quitting, ask your health care provider. Monitor your blood pressure at home as told by your health care provider. Keep all follow-up visits. This is important. Medicines Take over-the-counter and prescription medicines only as told by your health care provider. Follow directions carefully. Blood  pressure medicines must be taken as prescribed. Do not skip doses of blood pressure medicine. Doing this puts you at risk for problems and can make the medicine less effective. Ask your health care provider about side effects or reactions to medicines that you should watch for. Contact a health care provider if you: Think you are having a reaction to a medicine you are taking. Have headaches that keep coming back (recurring). Feel dizzy. Have swelling in your ankles. Have trouble with your vision. Get help right away if you: Develop a severe headache or confusion. Have unusual weakness or numbness. Feel faint. Have severe pain in your chest or abdomen. Vomit repeatedly. Have trouble breathing. These symptoms may be an emergency. Get help right away. Call 911. Do not wait to see if the symptoms will go away. Do not drive yourself to the hospital. Summary Hypertension is when the force of blood pumping through your arteries is too strong. If this condition is not controlled, it may put you at risk for serious complications. Your personal target blood pressure may vary depending on your medical conditions, your age, and other factors. For most people, a normal blood pressure is less than 120/80. Hypertension is treated with lifestyle changes, medicines, or a combination of both. Lifestyle changes include losing weight, eating a healthy,   low-sodium diet, exercising more, and limiting alcohol. This information is not intended to replace advice given to you by your health care provider. Make sure you discuss any questions you have with your health care provider. Document Revised: 02/28/2021 Document Reviewed: 02/28/2021 Elsevier Patient Education  2023 [employer]  

## 2022-04-20 ENCOUNTER — Telehealth: Payer: Self-pay | Admitting: Internal Medicine

## 2022-04-20 NOTE — Telephone Encounter (Signed)
Caller & Relationship to patient: Josip  Call back number: [phone]  Date of last office visit: 04/04/22  Date of next office visit: Not scheduled  Medication(s) to be refilled: oxyCODONE-acetaminophen (PERCOCET) 10-325 MG tablet        Preferred Pharmacy: [facility] [address]

## 2022-04-21 ENCOUNTER — Other Ambulatory Visit: Payer: Self-pay | Admitting: Internal Medicine

## 2022-04-21 DIAGNOSIS — M5416 Radiculopathy, lumbar region: Secondary | ICD-10-CM

## 2022-04-21 DIAGNOSIS — M159 Polyosteoarthritis, unspecified: Secondary | ICD-10-CM

## 2022-04-21 MED ORDER — OXYCODONE-ACETAMINOPHEN 10-325 MG PO TABS: 1.0000 | ORAL_TABLET | Freq: Four times a day (QID) | ORAL | 0 refills | Status: DC | PRN

## 2022-05-21 ENCOUNTER — Other Ambulatory Visit: Payer: Self-pay | Admitting: Internal Medicine

## 2022-05-21 ENCOUNTER — Telehealth: Payer: Self-pay | Admitting: Internal Medicine

## 2022-05-21 DIAGNOSIS — M5416 Radiculopathy, lumbar region: Secondary | ICD-10-CM

## 2022-05-21 DIAGNOSIS — M159 Polyosteoarthritis, unspecified: Secondary | ICD-10-CM

## 2022-05-21 MED ORDER — OXYCODONE-ACETAMINOPHEN 10-325 MG PO TABS: 1.0000 | ORAL_TABLET | Freq: Four times a day (QID) | ORAL | 0 refills | Status: DC | PRN

## 2022-05-21 NOTE — Telephone Encounter (Signed)
MEDICATION: oxyCODONE-acetaminophen (PERCOCET) 10-325 MG tablet   PHARMACY:  [id number] [place], [place] - [id number]    Comments:   **Let patient know to contact pharmacy at the end of the day to make sure medication is ready. **  ** Please notify patient to allow 48-72 hours to process**  **Encourage patient to contact the pharmacy for refills or they can [facility] through [facility]

## 2022-06-21 ENCOUNTER — Other Ambulatory Visit: Payer: Self-pay | Admitting: Internal Medicine

## 2022-06-21 ENCOUNTER — Telehealth: Payer: Self-pay | Admitting: Internal Medicine

## 2022-06-21 DIAGNOSIS — M5416 Radiculopathy, lumbar region: Secondary | ICD-10-CM

## 2022-06-21 DIAGNOSIS — M159 Polyosteoarthritis, unspecified: Secondary | ICD-10-CM

## 2022-06-21 MED ORDER — OXYCODONE-ACETAMINOPHEN 10-325 MG PO TABS: 1.0000 | ORAL_TABLET | Freq: Four times a day (QID) | ORAL | 0 refills | Status: DC | PRN

## 2022-06-21 NOTE — Telephone Encounter (Signed)
Caller & Relationship to patient:  self   Call back number:  [phone]   Date of last office visit:   Date of next office visit:   Medication(s) to be refilled:  Oxycodone        Preferred Pharmacy:  [facility]

## 2022-06-22 ENCOUNTER — Other Ambulatory Visit: Payer: Self-pay | Admitting: Internal Medicine

## 2022-06-22 DIAGNOSIS — E876 Hypokalemia: Secondary | ICD-10-CM

## 2022-06-22 DIAGNOSIS — I10 Essential (primary) hypertension: Secondary | ICD-10-CM

## 2022-07-19 ENCOUNTER — Telehealth: Payer: Self-pay | Admitting: Internal Medicine

## 2022-07-19 NOTE — Telephone Encounter (Signed)
Appt scheduled 08/08/22

## 2022-07-19 NOTE — Telephone Encounter (Signed)
Prescription Request  07/19/2022  LOV: 04/04/2022  What is the name of the medication or equipment?  oxyCODONE-acetaminophen (PERCOCET) 10-325 MG tablet   Have you contacted your pharmacy to request a refill? Yes   Which pharmacy would you like this sent to?  [id number] [place] [address]Phone: [phone]: [phone] Patient notified that their request is being sent to the clinical staff for review and that they should receive a response within 2 business days.   Please advise at Mobile [phone](mobile)

## 2022-07-23 NOTE — Telephone Encounter (Signed)
Patient is requesting update regarding refill - call back number [phone]

## 2022-07-25 NOTE — Telephone Encounter (Signed)
Pt called requesting refill for his Oxycodone call back # [phone]

## 2022-07-26 NOTE — Telephone Encounter (Signed)
Pt called back about getting his medication refilled states he's having back pain.

## 2022-07-27 ENCOUNTER — Other Ambulatory Visit: Payer: Self-pay | Admitting: Internal Medicine

## 2022-07-27 DIAGNOSIS — M5416 Radiculopathy, lumbar region: Secondary | ICD-10-CM

## 2022-07-27 DIAGNOSIS — M159 Polyosteoarthritis, unspecified: Secondary | ICD-10-CM

## 2022-07-27 MED ORDER — OXYCODONE-ACETAMINOPHEN 10-325 MG PO TABS: 1.0000 | ORAL_TABLET | Freq: Four times a day (QID) | ORAL | 0 refills | Status: DC | PRN

## 2022-07-31 DIAGNOSIS — M47816 Spondylosis without myelopathy or radiculopathy, lumbar region: Secondary | ICD-10-CM | POA: Diagnosis not present

## 2022-08-08 ENCOUNTER — Ambulatory Visit (INDEPENDENT_AMBULATORY_CARE_PROVIDER_SITE_OTHER): Payer: Medicare Other | Admitting: Internal Medicine

## 2022-08-08 ENCOUNTER — Encounter: Payer: Self-pay | Admitting: Internal Medicine

## 2022-08-08 VITALS — BP 156/90 | HR 67 | Temp 97.7°F | Ht 69.0 in | Wt 187.0 lb

## 2022-08-08 DIAGNOSIS — N182 Chronic kidney disease, stage 2 (mild): Secondary | ICD-10-CM | POA: Diagnosis not present

## 2022-08-08 DIAGNOSIS — I5032 Chronic diastolic (congestive) heart failure: Secondary | ICD-10-CM | POA: Diagnosis not present

## 2022-08-08 DIAGNOSIS — R7303 Prediabetes: Secondary | ICD-10-CM

## 2022-08-08 DIAGNOSIS — I10 Essential (primary) hypertension: Secondary | ICD-10-CM | POA: Diagnosis not present

## 2022-08-08 DIAGNOSIS — E785 Hyperlipidemia, unspecified: Secondary | ICD-10-CM | POA: Diagnosis not present

## 2022-08-08 DIAGNOSIS — D509 Iron deficiency anemia, unspecified: Secondary | ICD-10-CM

## 2022-08-08 LAB — CBC WITH DIFFERENTIAL/PLATELET
Basophils Absolute: 0.1 10*3/uL (ref 0.0–0.1)
Basophils Relative: 0.8 % (ref 0.0–3.0)
Eosinophils Absolute: 0.2 10*3/uL (ref 0.0–0.7)
Eosinophils Relative: 2.4 % (ref 0.0–5.0)
HCT: 38.4 % — ABNORMAL LOW (ref 39.0–52.0)
Hemoglobin: 12.7 g/dL — ABNORMAL LOW (ref 13.0–17.0)
Lymphocytes Relative: 31.2 % (ref 12.0–46.0)
Lymphs Abs: 2.4 10*3/uL (ref 0.7–4.0)
MCHC: 33 g/dL (ref 30.0–36.0)
MCV: 94.7 fl (ref 78.0–100.0)
Monocytes Absolute: 0.7 10*3/uL (ref 0.1–1.0)
Monocytes Relative: 8.7 % (ref 3.0–12.0)
Neutro Abs: 4.4 10*3/uL (ref 1.4–7.7)
Neutrophils Relative %: 56.9 % (ref 43.0–77.0)
Platelets: 336 10*3/uL (ref 150.0–400.0)
RBC: 4.05 Mil/uL — ABNORMAL LOW (ref 4.22–5.81)
RDW: 15 % (ref 11.5–15.5)
WBC: 7.8 10*3/uL (ref 4.0–10.5)

## 2022-08-08 LAB — BASIC METABOLIC PANEL
BUN: 16 mg/dL (ref 6–23)
CO2: 27 mEq/L (ref 19–32)
Calcium: 9.3 mg/dL (ref 8.4–10.5)
Chloride: 105 mEq/L (ref 96–112)
Creatinine, Ser: 1.11 mg/dL (ref 0.40–1.50)
GFR: 62.39 mL/min (ref >60.00)
Glucose, Bld: 85 mg/dL (ref 70–99)
Potassium: 4 mEq/L (ref 3.5–5.1)
Sodium: 139 mEq/L (ref 135–145)

## 2022-08-08 LAB — IBC + FERRITIN
Ferritin: 22.8 ng/mL (ref 22.0–322.0)
Iron: 67 ug/dL (ref 42–165)
Saturation Ratios: 18.5 % — ABNORMAL LOW (ref 20.0–50.0)
TIBC: 362.6 ug/dL (ref 250.0–450.0)
Transferrin: 259 mg/dL (ref 212.0–360.0)

## 2022-08-08 LAB — HEMOGLOBIN A1C: Hgb A1c MFr Bld: 5.6 % (ref 4.6–6.5)

## 2022-08-08 MED ORDER — ATORVASTATIN CALCIUM 40 MG PO TABS: 40.0000 mg | ORAL_TABLET | Freq: Every day | ORAL | 1 refills | Status: DC

## 2022-08-08 MED ORDER — SACUBITRIL-VALSARTAN 24-26 MG PO TABS: 1.0000 | ORAL_TABLET | Freq: Two times a day (BID) | ORAL | 0 refills | Status: DC

## 2022-08-08 NOTE — Patient Instructions (Signed)

## 2022-08-08 NOTE — Progress Notes (Signed)
Subjective:  Patient ID: [name], male    DOB: 1940-05-18  Age: 82 y.o. MRN: [id number]  CC: Anemia and Hypertension   HPI TAGGART PRASAD presents for f/up --  Based on prescription refills he is not taking Entresto.  He is active and denies chest pain, shortness of breath, diaphoresis, or edema.  Outpatient Medications Prior to Visit  Medication Sig Dispense Refill   aspirin EC 81 MG tablet Take 1 tablet (81 mg total) by mouth daily. 30 tablet 2   clopidogrel (PLAVIX) 75 MG tablet Take 1 tablet (75 mg total) by mouth daily. 30 tablet 2   KLOR-CON M20 20 MEQ tablet TAKE 1 TABLET BY MOUTH TWICE A DAY 180 tablet 0   metoprolol succinate (TOPROL-XL) 25 MG 24 hr tablet Take 0.5 tablets (12.5 mg total) by mouth daily. 30 tablet 2   nitroGLYCERIN (NITROSTAT) 0.4 MG SL tablet Place 1 tablet (0.4 mg total) under the tongue every 5 (five) minutes as needed for chest pain. 30 tablet 2   oxyCODONE-acetaminophen (PERCOCET) 10-325 MG tablet Take 1 tablet by mouth every 6 (six) hours as needed for pain. 100 tablet 0   pantoprazole (PROTONIX) 40 MG tablet TAKE 1 TABLET BY MOUTH EVERY DAY (Patient taking differently: 40 mg every morning.) 90 tablet 0   atorvastatin (LIPITOR) 40 MG tablet Take 1 tablet (40 mg total) by mouth daily. 30 tablet 2   ferrous sulfate 325 (65 FE) MG tablet Take 1 tablet (325 mg total) by mouth every morning. 90 tablet 1   sacubitril-valsartan (ENTRESTO) 24-26 MG Take 1 tablet by mouth 2 (two) times daily. 60 tablet 2   No facility-administered medications prior to visit.    ROS Review of Systems  Constitutional:  Negative for diaphoresis and fatigue.  HENT: Negative.    Eyes: Negative.   Respiratory:  Negative for cough, chest tightness, shortness of breath and wheezing.   Cardiovascular:  Negative for chest pain, palpitations and leg swelling.  Gastrointestinal:  Negative for abdominal pain, constipation, diarrhea, nausea and vomiting.  Endocrine: Negative.    Genitourinary: Negative.  Negative for difficulty urinating.  Musculoskeletal:  Positive for back pain. Negative for arthralgias, myalgias and neck pain.  Skin: Negative.   Neurological:  Negative for dizziness and weakness.  Hematological:  Negative for adenopathy. Does not bruise/bleed easily.  Psychiatric/Behavioral: Negative.      Objective:  BP (!) 156/90 (BP Location: Left Arm, Patient Position: Sitting, Cuff Size: Large)   Pulse 67   Temp 97.7 F (36.5 C) (Oral)   Ht  (1.753 m)   Wt 187 lb (84.8 kg)   SpO2 93%   BMI 27.62 kg/m   BP Readings from Last 3 Encounters:  08/08/22 (!) 156/90  04/04/22 (!) 148/78  12/26/21 118/66    Wt Readings from Last 3 Encounters:  08/08/22 187 lb (84.8 kg)  04/04/22 177 lb 12.8 oz (80.6 kg)  12/26/21 168 lb (76.2 kg)    Physical Exam Vitals reviewed.  Constitutional:      Appearance: He is not ill-appearing.  HENT:     Nose: Nose normal.     Mouth/Throat:     Mouth: Mucous membranes are moist.  Eyes:     General: No scleral icterus.    Conjunctiva/sclera: Conjunctivae normal.  Cardiovascular:     Rate and Rhythm: Normal rate and regular rhythm.     Heart sounds: No murmur heard. Pulmonary:     Effort: Pulmonary effort is normal.  Breath sounds: No stridor. No wheezing, rhonchi or rales.  Abdominal:     General: Abdomen is flat.     Palpations: There is no mass.     Tenderness: There is no abdominal tenderness. There is no guarding.     Hernia: No hernia is present.  Musculoskeletal:        General: Normal range of motion.     Cervical back: Neck supple.     Right lower leg: No edema.     Left lower leg: No edema.  Lymphadenopathy:     Cervical: No cervical adenopathy.  Skin:    General: Skin is warm and dry.  Neurological:     General: No focal deficit present.     Mental Status: He is alert. Mental status is at baseline.  Psychiatric:        Mood and Affect: Mood normal.        Behavior: Behavior  normal.     Lab Results  Component Value Date   WBC 7.8 08/08/2022   HGB 12.7 (L) 08/08/2022   HCT 38.4 (L) 08/08/2022   PLT 336.0 08/08/2022   GLUCOSE 85 08/08/2022   CHOL 110 09/20/2021   TRIG 106 09/20/2021   HDL 33 (L) 09/20/2021   LDLDIRECT 47.0 10/22/2019   LDLCALC 56 09/20/2021   ALT 15 12/26/2021   AST 18 12/26/2021   NA 139 08/08/2022   K 4.0 08/08/2022   CL 105 08/08/2022   CREATININE 1.11 08/08/2022   BUN 16 08/08/2022   CO2 27 08/08/2022   TSH 0.486 09/21/2021   PSA 0.87 03/18/2014   INR 1.2 09/20/2021   HGBA1C 5.6 08/08/2022    ECHOCARDIOGRAM COMPLETE  Result Date: 09/22/2021    ECHOCARDIOGRAM REPORT   Patient Name:   Isaid ZABDI KASAL Date of Exam: 09/21/2021 Medical Rec #:  [id number]     Height:       69.0 in Accession #:    [id number]    Weight:       180.0 lb Date of Birth:  02/10/1941      BSA:          1.976 m Patient Age:    81 years      BP:           143/86 mmHg Patient Gender: M             HR:           56 bpm. Exam Location:  Inpatient Procedure: 2D Echo, Cardiac Doppler, Color Doppler and Intracardiac            Opacification Agent                                 MODIFIED REPORT: This report was modified by Truett Mainland MD on 09/22/2021 due to Correction.  Indications:     122-I22.9 Subsequent ST elevation (STEM) and non-ST elevation                  (NSTEMI) myocardial infarction  History:         Patient has prior history of Echocardiogram examinations, most                  recent 06/01/2015. CHF, Acute MI, Abnormal ECG,                  Arrythmias:Bradycardia; Risk Factors:Hypertension and Current  Smoker.  Sonographer:     Sheralyn Boatman RDCS Referring Phys:  [id number] Angie Fava Diagnosing Phys: Truett Mainland MD  Sonographer Comments: Technically difficult study due to poor echo windows, suboptimal parasternal window and suboptimal apical window. Image acquisition challenging due to patient body habitus. Attempted to turn, very low  parasternal. IMPRESSIONS  1. Left ventricular ejection fraction, by estimation, is 35 to 40%. The left ventricle has normal function. The left ventricle demonstrates regional wall motion abnormalities (see scoring diagram/findings for description). Left ventricular diastolic parameters are consistent with Grade I diastolic dysfunction (impaired relaxation). There is moderate hypokinesis of the left ventricular, mid-apical anteroseptal wall, inferolateral wall and apical segment.  2. Right ventricular systolic function is normal. The right ventricular size is normal. There is mildly elevated pulmonary artery systolic pressure 40 mmHg.  3. The mitral valve is normal in structure. No evidence of mitral valve regurgitation. No evidence of mitral stenosis.  4. Tricuspid valve regurgitation is mild to moderate.  5. The aortic valve is calcified. Aortic valve regurgitation is mild. No aortic stenosis is present. FINDINGS  Left Ventricle: Left ventricular ejection fraction, by estimation, is 35 to 40%. The left ventricle has normal function. The left ventricle demonstrates regional wall motion abnormalities. Moderate hypokinesis of the left ventricular, mid-apical anteroseptal wall, inferolateral wall and apical segment. Definity contrast agent was given IV to delineate the left ventricular endocardial borders. The left ventricular internal cavity size was normal in size. There is no left ventricular hypertrophy. Left ventricular diastolic parameters are consistent with Grade I diastolic dysfunction (impaired relaxation). Normal left ventricular filling pressure. Right Ventricle: The right ventricular size is normal. No increase in right ventricular wall thickness. Right ventricular systolic function is normal. There is mildly elevated pulmonary artery systolic pressure. The tricuspid regurgitant velocity is 3.03  m/s, and with an assumed right atrial pressure of 3 mmHg, the estimated right ventricular systolic pressure is  39.7 mmHg. Left Atrium: Left atrial size was normal in size. Right Atrium: Right atrial size was normal in size. Pericardium: There is no evidence of pericardial effusion. Mitral Valve: The mitral valve is normal in structure. No evidence of mitral valve regurgitation. No evidence of mitral valve stenosis. Tricuspid Valve: The tricuspid valve is normal in structure. Tricuspid valve regurgitation is mild to moderate. Aortic Valve: The aortic valve is calcified. Aortic valve regurgitation is mild. No aortic stenosis is present. Pulmonic Valve: The pulmonic valve was normal in structure. Pulmonic valve regurgitation is not visualized. No evidence of pulmonic stenosis. Aorta: The aortic root is normal in size and structure. IAS/Shunts: The interatrial septum was not assessed.  LEFT VENTRICLE PLAX 2D LVIDd:         4.80 cm      Diastology LVIDs:         3.60 cm      LV e' medial:    5.98 cm/s LV PW:         1.00 cm      LV E/e' medial:  11.3 LV IVS:        1.00 cm      LV e' lateral:   6.31 cm/s LVOT diam:     2.60 cm      LV E/e' lateral: 10.7 LV SV:         83 LV SV Index:   42 LVOT Area:     5.31 cm  LV Volumes (MOD) LV vol d, MOD A2C: 99.4 ml LV vol d, MOD A4C: 114.0 ml  LV vol s, MOD A2C: 61.8 ml LV vol s, MOD A4C: 68.5 ml LV SV MOD A2C:     37.6 ml LV SV MOD A4C:     114.0 ml LV SV MOD BP:      45.8 ml RIGHT VENTRICLE             IVC RV S prime:     14.70 cm/s  IVC diam: 1.90 cm TAPSE (M-mode): 2.4 cm LEFT ATRIUM             Index        RIGHT ATRIUM           Index LA diam:        3.40 cm 1.72 cm/m   RA Area:     13.50 cm LA Vol (A2C):   33.0 ml 16.70 ml/m  RA Volume:   32.70 ml  16.55 ml/m LA Vol (A4C):   26.4 ml 13.36 ml/m LA Biplane Vol: 32.2 ml 16.30 ml/m  AORTIC VALVE LVOT Vmax:   79.60 cm/s LVOT Vmean:  49.900 cm/s LVOT VTI:    0.157 m  AORTA Ao Root diam: 3.40 cm MITRAL VALVE               TRICUSPID VALVE MV Area (PHT): 4.17 cm    TR Peak grad:   36.7 mmHg MV Decel Time: 182 msec    TR Vmax:         303.00 cm/s MV E velocity: 67.70 cm/s MV A velocity: 85.70 cm/s  SHUNTS MV E/A ratio:  0.79        Systemic VTI:  0.16 m                            Systemic Diam: 2.60 cm Truett Mainland MD Electronically signed by Truett Mainland MD Signature Date/Time: 09/21/2021/11:53:08 AM    Final (Updated)    CARDIAC CATHETERIZATION  Result Date: 09/21/2021 Images from the original result were not included. LM: Normal LAD: Tortuous. Rapid taper distally. No significant disease Lcx: Large, dominant, tortuous.        Distal 50% stenosis Ramus: Extreme tortuous takeoff. 95% stenosis RCA: Small, non-dominant. Mid 95% stenosis, best treated medically. Attempted to wire the vessel but low chance of successful intervention due to extreme tortuosity. Aborted attempt after 5 min. No complication noted. Recommend medical management. Elder Negus, MD Pager: [phone] Office: [phone]  CT Angio Chest/Abd/Pel for Dissection W and/or W/WO  Result Date: 09/20/2021 CLINICAL DATA:  Right chest pain, evaluate for dissection EXAM: CT ANGIOGRAPHY CHEST, ABDOMEN AND PELVIS TECHNIQUE: Non-contrast CT of the chest was initially obtained. Multidetector CT imaging through the chest, abdomen and pelvis was performed using the standard protocol during bolus administration of intravenous contrast. Multiplanar reconstructed images and MIPs were obtained and reviewed to evaluate the vascular anatomy. RADIATION DOSE REDUCTION: This exam was performed according to the departmental dose-optimization program which includes automated exposure control, adjustment of the mA and/or kV according to patient size and/or use of iterative reconstruction technique. CONTRAST:  OMNIPAQUE IOHEXOL 350 MG/ML SOLN COMPARISON:  CT abdomen/pelvis dated 06/10/2021. CT chest dated 01/20/2021. FINDINGS: CTA CHEST FINDINGS Cardiovascular: On unenhanced CT, there is no evidence of intramural hematoma. Following contrast administration, there is  preferential opacification of the thoracic aorta. No evidence of thoracic aortic aneurysm or dissection. Atherosclerotic calcifications of the aortic arch. Study is not tailored for evaluation of the pulmonary arteries. No evidence of central  pulmonary embolism. Heart is normal in size. No pericardial effusion. Mild coronary atherosclerosis of the LAD. Mediastinum/Nodes: No suspicious mediastinal lymphadenopathy. Visualized thyroid is unremarkable. Lungs/Pleura: Moderate centrilobular and paraseptal emphysematous changes, upper lung predominant. No suspicious pulmonary nodules. Minimal bibasilar atelectasis. No focal consolidation. No pleural effusion or pneumothorax. Musculoskeletal: Right shoulder arthroplasty. Degenerative changes of the thoracic spine. No fracture is seen. Review of the MIP images confirms the above findings. CTA ABDOMEN AND PELVIS FINDINGS VASCULAR Aorta: No evidence abdominal aortic aneurysm or dissection. Atherosclerotic calcifications. Patent. Celiac: Patent. SMA: Patent. Atherosclerotic calcifications. Renals: Patent bilaterally. Atherosclerotic calcifications at the origin of the left renal artery. IMA: Patent. Inflow: Patent bilaterally. Atherosclerotic calcifications. Veins: Unremarkable. Review of the MIP images confirms the above findings. NON-VASCULAR Hepatobiliary: Liver is within normal limits. Status post cholecystectomy. No intrahepatic or extrahepatic ductal dilatation. Pancreas: Within normal limits. Spleen: Within normal limits. Adrenals/Urinary Tract: Adrenal glands are within normal limits. Bilateral renal cysts, measuring up to 6.1 cm in the anterior left lower kidney (series 7/image 26). No hydronephrosis. Bladder is within normal limits. Stomach/Bowel: Stomach is within normal limits. No evidence of bowel obstruction. Normal appendix (series 7/image 198). Extensive sigmoid diverticulosis, without evidence of diverticulitis. Lymphatic: No suspicious abdominopelvic  lymphadenopathy. Reproductive: Mild prostatomegaly. Other: No abdominopelvic ascites. Musculoskeletal: Degenerative changes of the lumbar spine. Review of the MIP images confirms the above findings. IMPRESSION: No evidence of thoracoabdominal aortic aneurysm or dissection. No evidence of acute cardiopulmonary disease. Extensive left colonic diverticulosis, without evidence of diverticulitis. Additional ancillary findings as above. Electronically Signed   By: Charline Bills M.D.   On: 09/20/2021 21:37   CT Head Wo Contrast  Result Date: 09/20/2021 CLINICAL DATA:  Near syncope, altered mental status EXAM: CT HEAD WITHOUT CONTRAST CT CERVICAL SPINE WITHOUT CONTRAST TECHNIQUE: Multidetector CT imaging of the head and cervical spine was performed following the standard protocol without intravenous contrast. Multiplanar CT image reconstructions of the cervical spine were also generated. RADIATION DOSE REDUCTION: This exam was performed according to the departmental dose-optimization program which includes automated exposure control, adjustment of the mA and/or kV according to patient size and/or use of iterative reconstruction technique. COMPARISON:  01/20/2021 FINDINGS: CT HEAD FINDINGS Brain: No evidence of acute infarction, hemorrhage, hydrocephalus, extra-axial collection or mass lesion/mass effect. Subcortical white matter and periventricular small vessel ischemic changes. Mild chronic left hemispheric atrophy with prominent extra-axial CSF. Vascular: Mild intracranial atherosclerosis. Skull: Normal. Negative for fracture or focal lesion. Sinuses/Orbits: The visualized paranasal sinuses are essentially clear. The mastoid air cells are unopacified. Other: None. CT CERVICAL SPINE FINDINGS Alignment: Reversal of the normal cervical lordosis. Skull base and vertebrae: No acute fracture. No primary bone lesion or focal pathologic process. Soft tissues and spinal canal: No prevertebral fluid or swelling. No visible  canal hematoma. Disc levels: Mild to moderate degenerative changes of the mid cervical spine. Spinal canal is patent. Upper chest: Evaluated on dedicated CTA chest. Other: None. IMPRESSION: No evidence of acute intracranial abnormality. Atrophy with small vessel ischemic changes. No evidence of traumatic injury to the cervical spine. Mild to moderate degenerative changes. Electronically Signed   By: Charline Bills M.D.   On: 09/20/2021 21:27   CT Cervical Spine Wo Contrast  Result Date: 09/20/2021 CLINICAL DATA:  Near syncope, altered mental status EXAM: CT HEAD WITHOUT CONTRAST CT CERVICAL SPINE WITHOUT CONTRAST TECHNIQUE: Multidetector CT imaging of the head and cervical spine was performed following the standard protocol without intravenous contrast. Multiplanar CT image reconstructions of the cervical spine were  also generated. RADIATION DOSE REDUCTION: This exam was performed according to the departmental dose-optimization program which includes automated exposure control, adjustment of the mA and/or kV according to patient size and/or use of iterative reconstruction technique. COMPARISON:  01/20/2021 FINDINGS: CT HEAD FINDINGS Brain: No evidence of acute infarction, hemorrhage, hydrocephalus, extra-axial collection or mass lesion/mass effect. Subcortical white matter and periventricular small vessel ischemic changes. Mild chronic left hemispheric atrophy with prominent extra-axial CSF. Vascular: Mild intracranial atherosclerosis. Skull: Normal. Negative for fracture or focal lesion. Sinuses/Orbits: The visualized paranasal sinuses are essentially clear. The mastoid air cells are unopacified. Other: None. CT CERVICAL SPINE FINDINGS Alignment: Reversal of the normal cervical lordosis. Skull base and vertebrae: No acute fracture. No primary bone lesion or focal pathologic process. Soft tissues and spinal canal: No prevertebral fluid or swelling. No visible canal hematoma. Disc levels: Mild to moderate  degenerative changes of the mid cervical spine. Spinal canal is patent. Upper chest: Evaluated on dedicated CTA chest. Other: None. IMPRESSION: No evidence of acute intracranial abnormality. Atrophy with small vessel ischemic changes. No evidence of traumatic injury to the cervical spine. Mild to moderate degenerative changes. Electronically Signed   By: Charline Bills M.D.   On: 09/20/2021 21:27   DG Chest Port 1 View  Result Date: 09/20/2021 CLINICAL DATA:  Chest pain. EXAM: PORTABLE CHEST 1 VIEW COMPARISON:  June 10, 2021. FINDINGS: The heart size and mediastinal contours are within normal limits. Both lungs are clear. Status post right shoulder arthroplasty. IMPRESSION: No active disease. Electronically Signed   By: Lupita Raider M.D.   On: 09/20/2021 18:45    Assessment & Plan:    Benign essential HTN- He has not achieved his blood pressure goal.  Will restart Entresto. -     Basic metabolic panel; Future  Prediabetes -     Hemoglobin A1c; Future -     Basic metabolic panel; Future  Chronic diastolic CHF (congestive heart failure) -     Sacubitril-Valsartan; Take 1 tablet by mouth 2 (two) times daily.  Dispense: 180 tablet; Refill: 0  Chronic iron deficiency anemia -     IBC + Ferritin; Future -     CBC with Differential/Platelet; Future -     ACCRUFeR; Take 1 capsule (30 mg total) by mouth in the morning and at bedtime.  Dispense: 180 capsule; Refill: 1  CKD (chronic kidney disease), stage II- Will avoid nephro toxic agents. -     Basic metabolic panel; Future  Hyperlipidemia with target LDL less than 130 -     Atorvastatin Calcium; Take 1 tablet (40 mg total) by mouth daily.  Dispense: 90 tablet; Refill: 1     Follow-up: Return in about 3 months (around 11/07/2022).  Sanda Linger, MD

## 2022-08-16 MED ORDER — ACCRUFER 30 MG PO CAPS
1.0000 | ORAL_CAPSULE | Freq: Two times a day (BID) | ORAL | 1 refills | Status: DC
Start: 2022-08-16 — End: 2022-12-19

## 2022-08-17 ENCOUNTER — Other Ambulatory Visit: Payer: Self-pay | Admitting: Internal Medicine

## 2022-08-17 ENCOUNTER — Telehealth: Payer: Self-pay | Admitting: Internal Medicine

## 2022-08-17 NOTE — Telephone Encounter (Signed)
Prescription Request  08/17/2022  LOV: 08/08/2022  What is the name of the medication or equipment? oxycodone  Have you contacted your pharmacy to request a refill? No   Which pharmacy would you like this sent to?  [id number] [place], [place] - [address] [place] [address] Phone: [phone] Fax: [phone]     Patient notified that their request is being sent to the clinical staff for review and that they should receive a response within 2 business days.   Please advise at Mobile [phone] (mobile)

## 2022-08-20 ENCOUNTER — Other Ambulatory Visit: Payer: Self-pay | Admitting: Internal Medicine

## 2022-08-20 DIAGNOSIS — M5416 Radiculopathy, lumbar region: Secondary | ICD-10-CM

## 2022-08-20 DIAGNOSIS — M159 Polyosteoarthritis, unspecified: Secondary | ICD-10-CM

## 2022-08-20 MED ORDER — OXYCODONE-ACETAMINOPHEN 10-325 MG PO TABS
1.0000 | ORAL_TABLET | Freq: Four times a day (QID) | ORAL | 0 refills | Status: DC | PRN
Start: 2022-08-20 — End: 2022-09-21

## 2022-09-19 ENCOUNTER — Telehealth: Payer: Self-pay | Admitting: Internal Medicine

## 2022-09-19 NOTE — Telephone Encounter (Signed)
Prescription Request  09/19/2022  LOV: 08/08/2022  What is the name of the medication or equipment?  oxyCODONE-acetaminophen (PERCOCET)    Have you contacted your pharmacy to request a refill? No   Which pharmacy would you like this sent to?   [id number] [place], [place] - [address] [place] [address] Phone: [phone] Fax: [phone]   Patient notified that their request is being sent to the clinical staff for review and that they should receive a response within 2 business days.   Please advise at Mobile [phone] (mobile)

## 2022-09-21 ENCOUNTER — Other Ambulatory Visit: Payer: Self-pay | Admitting: Internal Medicine

## 2022-09-21 DIAGNOSIS — M159 Polyosteoarthritis, unspecified: Secondary | ICD-10-CM

## 2022-09-21 DIAGNOSIS — M5416 Radiculopathy, lumbar region: Secondary | ICD-10-CM

## 2022-09-21 MED ORDER — OXYCODONE-ACETAMINOPHEN 10-325 MG PO TABS
1.0000 | ORAL_TABLET | Freq: Four times a day (QID) | ORAL | 0 refills | Status: DC | PRN
Start: 2022-09-21 — End: 2022-10-19

## 2022-10-19 ENCOUNTER — Other Ambulatory Visit: Payer: Self-pay | Admitting: Internal Medicine

## 2022-10-19 ENCOUNTER — Telehealth: Payer: Self-pay | Admitting: Internal Medicine

## 2022-10-19 DIAGNOSIS — M159 Polyosteoarthritis, unspecified: Secondary | ICD-10-CM

## 2022-10-19 DIAGNOSIS — M5416 Radiculopathy, lumbar region: Secondary | ICD-10-CM

## 2022-10-19 MED ORDER — OXYCODONE-ACETAMINOPHEN 10-325 MG PO TABS
1.0000 | ORAL_TABLET | Freq: Four times a day (QID) | ORAL | 0 refills | Status: DC | PRN
Start: 2022-10-19 — End: 2022-11-27

## 2022-10-19 NOTE — Telephone Encounter (Signed)
Prescription Request  10/19/2022  LOV: 08/08/2022  What is the name of the medication or equipment? oxycodone  Have you contacted your pharmacy to request a refill? Yes   Which pharmacy would you like this sent to?  [id number] [place], [place] - [address] [place] [address] Phone: [phone] Fax: [phone]     Patient notified that their request is being sent to the clinical staff for review and that they should receive a response within 2 business days.   Please advise at Mobile [phone] (mobile)

## 2022-10-26 ENCOUNTER — Other Ambulatory Visit (HOSPITAL_COMMUNITY): Payer: Self-pay

## 2022-11-19 ENCOUNTER — Telehealth: Payer: Self-pay | Admitting: Internal Medicine

## 2022-11-19 NOTE — Telephone Encounter (Signed)
Prescription Request  11/19/2022  LOV: 08/08/2022  What is the name of the medication or equipment? oxyCODONE-acetaminophen (PERCOCET) 10-325 MG tablet   Have you contacted your pharmacy to request a refill? No   Which pharmacy would you like this sent to?   [id number] [address] [place], [place] [address] Phone: [phone]  Fax: [phone]   Patient notified that their request is being sent to the clinical staff for review and that they should receive a response within 2 business days.   Please advise at Mobile [phone] (mobile)

## 2022-11-21 NOTE — Telephone Encounter (Signed)
Pt need appt w/ MD before he will refill.Adrian KitchenRaechel Chute

## 2022-11-21 NOTE — Telephone Encounter (Signed)
Patient called to check on the status of this refill. Best callback is [phone].

## 2022-11-23 ENCOUNTER — Telehealth: Payer: Self-pay | Admitting: Internal Medicine

## 2022-11-23 NOTE — Telephone Encounter (Signed)
Pt has made appt and wanted to know if he could get a temporary refill. Please advise  oxyCODONE-acetaminophen (PERCOCET) 10-325 MG tablet

## 2022-11-23 NOTE — Telephone Encounter (Signed)
Error

## 2022-11-26 ENCOUNTER — Telehealth: Payer: Self-pay | Admitting: Internal Medicine

## 2022-11-26 NOTE — Telephone Encounter (Signed)
Dr Yetta Barre the pt last refill was on  6/14 for oxyCODONE.

## 2022-11-27 ENCOUNTER — Other Ambulatory Visit: Payer: Self-pay | Admitting: Internal Medicine

## 2022-11-27 DIAGNOSIS — M5416 Radiculopathy, lumbar region: Secondary | ICD-10-CM

## 2022-11-27 DIAGNOSIS — M159 Polyosteoarthritis, unspecified: Secondary | ICD-10-CM

## 2022-11-27 MED ORDER — OXYCODONE-ACETAMINOPHEN 10-325 MG PO TABS
1.0000 | ORAL_TABLET | Freq: Four times a day (QID) | ORAL | 0 refills | Status: DC | PRN
Start: 2022-11-27 — End: 2022-12-21

## 2022-12-05 ENCOUNTER — Encounter (INDEPENDENT_AMBULATORY_CARE_PROVIDER_SITE_OTHER): Payer: Self-pay

## 2022-12-19 ENCOUNTER — Ambulatory Visit (INDEPENDENT_AMBULATORY_CARE_PROVIDER_SITE_OTHER): Payer: Medicare Other | Admitting: Internal Medicine

## 2022-12-19 ENCOUNTER — Ambulatory Visit (INDEPENDENT_AMBULATORY_CARE_PROVIDER_SITE_OTHER): Payer: Medicare Other

## 2022-12-19 ENCOUNTER — Encounter: Payer: Self-pay | Admitting: Internal Medicine

## 2022-12-19 VITALS — BP 136/78 | HR 46 | Temp 97.8°F | Resp 16 | Ht 69.0 in | Wt 182.0 lb

## 2022-12-19 DIAGNOSIS — R9431 Abnormal electrocardiogram [ECG] [EKG]: Secondary | ICD-10-CM

## 2022-12-19 DIAGNOSIS — R052 Subacute cough: Secondary | ICD-10-CM

## 2022-12-19 DIAGNOSIS — I5042 Chronic combined systolic (congestive) and diastolic (congestive) heart failure: Secondary | ICD-10-CM | POA: Diagnosis not present

## 2022-12-19 DIAGNOSIS — M159 Polyosteoarthritis, unspecified: Secondary | ICD-10-CM | POA: Diagnosis not present

## 2022-12-19 DIAGNOSIS — I10 Essential (primary) hypertension: Secondary | ICD-10-CM | POA: Diagnosis not present

## 2022-12-19 DIAGNOSIS — R0602 Shortness of breath: Secondary | ICD-10-CM | POA: Diagnosis not present

## 2022-12-19 DIAGNOSIS — E785 Hyperlipidemia, unspecified: Secondary | ICD-10-CM | POA: Diagnosis not present

## 2022-12-19 DIAGNOSIS — Z96611 Presence of right artificial shoulder joint: Secondary | ICD-10-CM | POA: Diagnosis not present

## 2022-12-19 DIAGNOSIS — R0609 Other forms of dyspnea: Secondary | ICD-10-CM | POA: Diagnosis not present

## 2022-12-19 DIAGNOSIS — R001 Bradycardia, unspecified: Secondary | ICD-10-CM | POA: Diagnosis not present

## 2022-12-19 DIAGNOSIS — M5416 Radiculopathy, lumbar region: Secondary | ICD-10-CM | POA: Diagnosis not present

## 2022-12-19 DIAGNOSIS — R058 Other specified cough: Secondary | ICD-10-CM | POA: Diagnosis not present

## 2022-12-19 DIAGNOSIS — D509 Iron deficiency anemia, unspecified: Secondary | ICD-10-CM

## 2022-12-19 DIAGNOSIS — D510 Vitamin B12 deficiency anemia due to intrinsic factor deficiency: Secondary | ICD-10-CM | POA: Diagnosis not present

## 2022-12-19 LAB — BASIC METABOLIC PANEL
BUN: 16 mg/dL (ref 6–23)
CO2: 27 mEq/L (ref 19–32)
Calcium: 9.4 mg/dL (ref 8.4–10.5)
Chloride: 104 mEq/L (ref 96–112)
Creatinine, Ser: 1.03 mg/dL (ref 0.40–1.50)
GFR: 68.07 mL/min (ref >60.00)
Glucose, Bld: 90 mg/dL (ref 70–99)
Potassium: 4.2 mEq/L (ref 3.5–5.1)
Sodium: 137 mEq/L (ref 135–145)

## 2022-12-19 LAB — CBC WITH DIFFERENTIAL/PLATELET
Basophils Absolute: 0.1 10*3/uL (ref 0.0–0.1)
Basophils Relative: 0.7 % (ref 0.0–3.0)
Eosinophils Absolute: 0.3 10*3/uL (ref 0.0–0.7)
Eosinophils Relative: 3.2 % (ref 0.0–5.0)
HCT: 38.1 % — ABNORMAL LOW (ref 39.0–52.0)
Hemoglobin: 12.1 g/dL — ABNORMAL LOW (ref 13.0–17.0)
Lymphocytes Relative: 27.6 % (ref 12.0–46.0)
Lymphs Abs: 2.3 10*3/uL (ref 0.7–4.0)
MCHC: 31.8 g/dL (ref 30.0–36.0)
MCV: 92.8 fl (ref 78.0–100.0)
Monocytes Absolute: 0.7 10*3/uL (ref 0.1–1.0)
Monocytes Relative: 8.9 % (ref 3.0–12.0)
Neutro Abs: 5 10*3/uL (ref 1.4–7.7)
Neutrophils Relative %: 59.6 % (ref 43.0–77.0)
Platelets: 401 10*3/uL — ABNORMAL HIGH (ref 150.0–400.0)
RBC: 4.1 Mil/uL — ABNORMAL LOW (ref 4.22–5.81)
RDW: 17.1 % — ABNORMAL HIGH (ref 11.5–15.5)
WBC: 8.3 10*3/uL (ref 4.0–10.5)

## 2022-12-19 LAB — LIPID PANEL
Cholesterol: 109 mg/dL (ref 0–200)
HDL: 40.1 mg/dL (ref >39.00)
LDL Cholesterol: 53 mg/dL (ref 0–99)
NonHDL: 68.57
Total CHOL/HDL Ratio: 3
Triglycerides: 76 mg/dL (ref 0.0–149.0)
VLDL: 15.2 mg/dL (ref 0.0–40.0)

## 2022-12-19 LAB — TSH: TSH: 2.24 u[IU]/mL (ref 0.35–5.50)

## 2022-12-19 LAB — BRAIN NATRIURETIC PEPTIDE: Pro B Natriuretic peptide (BNP): 59 pg/mL (ref 0.0–100.0)

## 2022-12-19 LAB — IBC + FERRITIN
Ferritin: 15.6 ng/mL — ABNORMAL LOW (ref 22.0–322.0)
Iron: 34 ug/dL — ABNORMAL LOW (ref 42–165)
Saturation Ratios: 8.4 % — ABNORMAL LOW (ref 20.0–50.0)
TIBC: 404.6 ug/dL (ref 250.0–450.0)
Transferrin: 289 mg/dL (ref 212.0–360.0)

## 2022-12-19 LAB — FOLATE: Folate: 18.6 ng/mL (ref >5.9)

## 2022-12-19 LAB — TROPONIN I (HIGH SENSITIVITY): High Sens Troponin I: 10 ng/L (ref 2–17)

## 2022-12-19 MED ORDER — ACCRUFER 30 MG PO CAPS
1.0000 | ORAL_CAPSULE | Freq: Two times a day (BID) | ORAL | 1 refills | Status: DC
Start: 2022-12-19 — End: 2024-01-28

## 2022-12-19 MED ORDER — CYANOCOBALAMIN 1000 MCG/ML IJ SOLN
1000.0000 ug | Freq: Once | INTRAMUSCULAR | Status: AC
Start: 2022-12-19 — End: 2022-12-19
  Administered 2022-12-19: 1000 ug via INTRAMUSCULAR

## 2022-12-19 NOTE — Patient Instructions (Signed)
Bradycardia, Adult Bradycardia is a slower-than-normal heartbeat. A normal resting heart rate for an adult ranges from 60 to 100 beats per minute. With bradycardia, the resting heart rate is less than 60 beats per minute. Bradycardia can prevent enough oxygen from reaching certain areas of your body when you are active. It can be serious if it keeps enough oxygen from reaching your brain and other parts of your body. Bradycardia is not a problem for everyone. For some healthy adults, a slow resting heart rate is normal. What are the causes? This condition may be caused by: A problem with the heart, including: A problem with the heart's electrical system, such as a heart block. With a heart block, electrical signals between the chambers of the heart are partially or completely blocked, so they are not able to work as they should. A problem with the heart's natural pacemaker (sinus node). Heart disease. A heart attack. Heart damage. Lyme disease. A heart infection. A heart condition that is present at birth (congenital heart defect). Certain medicines that treat heart conditions. Certain conditions, such as hypothyroidism and obstructive sleep apnea. Problems with the balance of chemicals and other substances, like potassium, in the blood. Trauma. Radiation therapy. What increases the risk? You are more likely to develop this condition if you: Are age 65 or older. Have high blood pressure (hypertension), high cholesterol (hyperlipidemia), or diabetes. Drink heavily, use tobacco or nicotine products, or use drugs. What are the signs or symptoms? Symptoms of this condition include: Light-headedness. Feeling faint or fainting. Fatigue and weakness. Trouble with activity or exercise. Shortness of breath. Chest pain (angina). Drowsiness. Confusion. Dizziness. How is this diagnosed? This condition may be diagnosed based on: Your symptoms. Your medical history. A physical exam. During  the exam, your health care provider will listen to your heartbeat and check your pulse. To confirm the diagnosis, your health care provider may order tests, such as: Blood tests. An electrocardiogram (ECG). This test records the heart's electrical activity. The test can show how fast your heart is beating and whether the heartbeat is steady. A test in which you wear a portable device (event recorder or Holter monitor) to record your heart's electrical activity while you go about your day. An exercise test. How is this treated? Treatment for this condition depends on the cause of the condition and how severe your symptoms are. Treatment may involve: Treatment of the underlying condition. Changing your medicines or how much medicine you take. Having a small, battery-operated device called a pacemaker implanted under the skin. When bradycardia occurs, this device can be used to increase your heart rate and help your heart beat in a regular rhythm. Follow these instructions at home: Lifestyle Manage any health conditions that contribute to bradycardia as told by your health care provider. Follow a heart-healthy diet. A nutrition specialist (dietitian) can help educate you about healthy food options and changes. Follow an exercise program that is approved by your health care provider. Maintain a healthy weight. Try to reduce or manage your stress, such as with yoga or meditation. If you need help reducing stress, ask your health care provider. Do not use any products that contain nicotine or tobacco. These products include cigarettes, chewing tobacco, and vaping devices, such as e-cigarettes. If you need help quitting, ask your health care provider. Do not use illegal drugs. Alcohol use If you drink alcohol: Limit how much you have to: 0-1 drink a day for women who are not pregnant. 0-2 drinks a day   for men. Know how much alcohol is in a drink. In the [place] one drink equals one 12 oz bottle of  beer (355 mL), one 5 oz glass of wine (148 mL), or one 1 oz glass of hard liquor (44 mL). General instructions Take over-the-counter and prescription medicines only as told by your health care provider. Keep all follow-up visits. This is important. How is this prevented? In some cases, bradycardia may be prevented by: Treating underlying medical problems. Stopping behaviors or medicines that can trigger the condition. Contact a health care provider if: You feel light-headed or dizzy. You almost faint. You feel weak or are easily fatigued during physical activity. You experience confusion or have memory problems. Get help right away if: You faint. You have chest pains or an irregular heartbeat (palpitations). You have trouble breathing. These symptoms may represent a serious problem that is an emergency. Do not wait to see if the symptoms will go away. Get medical help right away. Call your local emergency services (911 in the [place] Do not drive yourself to the hospital. Summary Bradycardia is a slower-than-normal heartbeat. With bradycardia, the resting heart rate is less than 60 beats per minute. Treatment for this condition depends on the cause. Manage any health conditions that contribute to bradycardia as told by your health care provider. Do not use any products that contain nicotine or tobacco. These products include cigarettes, chewing tobacco, and vaping devices, such as e-cigarettes. Keep all follow-up visits. This is important. This information is not intended to replace advice given to you by your health care provider. Make sure you discuss any questions you have with your health care provider. Document Revised: 08/14/2020 Document Reviewed: 08/14/2020 [employer] Patient Education  2024 [employer]  

## 2022-12-19 NOTE — Progress Notes (Unsigned)
Subjective:  Patient ID: Adrian Miller, male    DOB: 1940/12/27  Age: 82 y.o. MRN: [id number]  CC: Back Pain, Osteoarthritis, and Cough   HPI Adrian Miller presents for f/up -----  Discussed the use of AI scribe software for clinical note transcription with the patient, who gave verbal consent to proceed.  History of Present Illness   The patient, with a history of chronic back pain, reports that his pain medication is generally effective, but requires more frequent dosing than currently prescribed. He notes that he needs to "double up" on medication when engaging in outdoor activities. Despite the back pain, the patient maintains an active lifestyle.  The patient also reports experiencing shortness of breath, which he attributes to ongoing smoking. He expresses a desire to quit smoking but has not yet done so. He denies coughing up blood, but does report the presence of phlegm, particularly after a recent coughing episode.       Outpatient Medications Prior to Visit  Medication Sig Dispense Refill   aspirin EC 81 MG tablet Take 1 tablet (81 mg total) by mouth daily. 30 tablet 2   clopidogrel (PLAVIX) 75 MG tablet Take 1 tablet (75 mg total) by mouth daily. 30 tablet 2   KLOR-CON M20 20 MEQ tablet TAKE 1 TABLET BY MOUTH TWICE A DAY 180 tablet 0   nitroGLYCERIN (NITROSTAT) 0.4 MG SL tablet Place 1 tablet (0.4 mg total) under the tongue every 5 (five) minutes as needed for chest pain. 30 tablet 2   pantoprazole (PROTONIX) 40 MG tablet TAKE 1 TABLET BY MOUTH EVERY DAY (Patient taking differently: 40 mg every morning.) 90 tablet 0   atorvastatin (LIPITOR) 40 MG tablet Take 1 tablet (40 mg total) by mouth daily. 90 tablet 1   Ferric Maltol (ACCRUFER) 30 MG CAPS Take 1 capsule (30 mg total) by mouth in the morning and at bedtime. 180 capsule 1   metoprolol succinate (TOPROL-XL) 25 MG 24 hr tablet Take 0.5 tablets (12.5 mg total) by mouth daily. 30 tablet 2   oxyCODONE-acetaminophen (PERCOCET)  10-325 MG tablet Take 1 tablet by mouth every 6 (six) hours as needed for pain. 100 tablet 0   sacubitril-valsartan (ENTRESTO) 24-26 MG Take 1 tablet by mouth 2 (two) times daily. 180 tablet 0   No facility-administered medications prior to visit.    ROS Review of Systems  Constitutional:  Negative for appetite change, chills, diaphoresis, fatigue and fever.  HENT: Negative.    Respiratory:  Positive for cough and shortness of breath. Negative for chest tightness.   Cardiovascular:  Negative for chest pain, palpitations and leg swelling.  Gastrointestinal:  Negative for abdominal pain, constipation, diarrhea and vomiting.  Genitourinary:  Negative for difficulty urinating.  Musculoskeletal:  Positive for arthralgias, back pain and gait problem. Negative for myalgias.  Skin: Negative.   Neurological:  Positive for dizziness and light-headedness. Negative for syncope, speech difficulty and weakness.  Hematological:  Negative for adenopathy. Does not bruise/bleed easily.  Psychiatric/Behavioral: Negative.      Objective:  BP 136/78 (BP Location: Left Arm, Patient Position: Sitting, Cuff Size: Large)   Pulse (!) 46   Temp 97.8 F (36.6 C) (Oral)   Resp 16   Ht 5\' 9"  (1.753 m)   Wt 182 lb (82.6 kg)   SpO2 94%   BMI 26.88 kg/m   BP Readings from Last 3 Encounters:  12/20/22 124/82  12/19/22 136/78  08/08/22 (!) 156/90    Wt Readings from Last  3 Encounters:  12/20/22 184 lb (83.5 kg)  12/19/22 182 lb (82.6 kg)  08/08/22 187 lb (84.8 kg)    Physical Exam Vitals reviewed.  Constitutional:      Appearance: Normal appearance.  HENT:     Mouth/Throat:     Mouth: Mucous membranes are moist.  Eyes:     General: No scleral icterus.    Conjunctiva/sclera: Conjunctivae normal.  Cardiovascular:     Rate and Rhythm: Regular rhythm. Bradycardia present.     Heart sounds: Normal heart sounds, S1 normal and S2 normal. No murmur heard.    No friction rub. No gallop.     Comments:  EKG-- Possible LL reversal SB, 46 bpm Septal infarct pattern is old +++ T wave changes Pulmonary:     Effort: Pulmonary effort is normal.     Breath sounds: No stridor. No wheezing, rhonchi or rales.  Abdominal:     General: Abdomen is flat.     Palpations: There is no mass.     Tenderness: There is no abdominal tenderness. There is no guarding.     Hernia: No hernia is present.  Musculoskeletal:     Cervical back: Neck supple.     Right lower leg: No edema.     Left lower leg: No edema.  Lymphadenopathy:     Cervical: No cervical adenopathy.  Skin:    General: Skin is warm and dry.  Neurological:     General: No focal deficit present.     Mental Status: He is alert. Mental status is at baseline.  Psychiatric:        Mood and Affect: Mood normal.        Behavior: Behavior normal.     Lab Results  Component Value Date   WBC 8.3 12/19/2022   HGB 12.1 (L) 12/19/2022   HCT 38.1 (L) 12/19/2022   PLT 401.0 (H) 12/19/2022   GLUCOSE 90 12/19/2022   CHOL 109 12/19/2022   TRIG 76.0 12/19/2022   HDL 40.10 12/19/2022   LDLDIRECT 47.0 10/22/2019   LDLCALC 53 12/19/2022   ALT 15 12/26/2021   AST 18 12/26/2021   NA 137 12/19/2022   K 4.2 12/19/2022   CL 104 12/19/2022   CREATININE 1.03 12/19/2022   BUN 16 12/19/2022   CO2 27 12/19/2022   TSH 2.24 12/19/2022   PSA 0.87 03/18/2014   INR 1.2 09/20/2021   HGBA1C 5.6 08/08/2022    ECHOCARDIOGRAM COMPLETE  Result Date: 09/22/2021    ECHOCARDIOGRAM REPORT   Patient Name:   Adrian Miller Date of Exam: 09/21/2021 Medical Rec #:  [id number]     Height:       69.0 in Accession #:    [id number]    Weight:       180.0 lb Date of Birth:  [birth year]      BSA:          1.976 m Patient Age:    81 years      BP:           143/86 mmHg Patient Gender: M             HR:           56 bpm. Exam Location:  Inpatient Procedure: 2D Echo, Cardiac Doppler, Color Doppler and Intracardiac            Opacification Agent  MODIFIED REPORT: This report was modified by Truett Mainland MD on 09/22/2021 due to Correction.  Indications:     122-I22.9 Subsequent ST elevation (STEM) and non-ST elevation                  (NSTEMI) myocardial infarction  History:         Patient has prior history of Echocardiogram examinations, most                  recent 06/01/2015. CHF, Acute MI, Abnormal ECG,                  Arrythmias:Bradycardia; Risk Factors:Hypertension and Current                  Smoker.  Sonographer:     Sheralyn Boatman RDCS Referring Phys:  [id number] Angie Fava Diagnosing Phys: Truett Mainland MD  Sonographer Comments: Technically difficult study due to poor echo windows, suboptimal parasternal window and suboptimal apical window. Image acquisition challenging due to patient body habitus. Attempted to turn, very low parasternal. IMPRESSIONS  1. Left ventricular ejection fraction, by estimation, is 35 to 40%. The left ventricle has normal function. The left ventricle demonstrates regional wall motion abnormalities (see scoring diagram/findings for description). Left ventricular diastolic parameters are consistent with Grade I diastolic dysfunction (impaired relaxation). There is moderate hypokinesis of the left ventricular, mid-apical anteroseptal wall, inferolateral wall and apical segment.  2. Right ventricular systolic function is normal. The right ventricular size is normal. There is mildly elevated pulmonary artery systolic pressure 40 mmHg.  3. The mitral valve is normal in structure. No evidence of mitral valve regurgitation. No evidence of mitral stenosis.  4. Tricuspid valve regurgitation is mild to moderate.  5. The aortic valve is calcified. Aortic valve regurgitation is mild. No aortic stenosis is present. FINDINGS  Left Ventricle: Left ventricular ejection fraction, by estimation, is 35 to 40%. The left ventricle has normal function. The left ventricle demonstrates regional wall motion abnormalities. Moderate  hypokinesis of the left ventricular, mid-apical anteroseptal wall, inferolateral wall and apical segment. Definity contrast agent was given IV to delineate the left ventricular endocardial borders. The left ventricular internal cavity size was normal in size. There is no left ventricular hypertrophy. Left ventricular diastolic parameters are consistent with Grade I diastolic dysfunction (impaired relaxation). Normal left ventricular filling pressure. Right Ventricle: The right ventricular size is normal. No increase in right ventricular wall thickness. Right ventricular systolic function is normal. There is mildly elevated pulmonary artery systolic pressure. The tricuspid regurgitant velocity is 3.03  m/s, and with an assumed right atrial pressure of 3 mmHg, the estimated right ventricular systolic pressure is 39.7 mmHg. Left Atrium: Left atrial size was normal in size. Right Atrium: Right atrial size was normal in size. Pericardium: There is no evidence of pericardial effusion. Mitral Valve: The mitral valve is normal in structure. No evidence of mitral valve regurgitation. No evidence of mitral valve stenosis. Tricuspid Valve: The tricuspid valve is normal in structure. Tricuspid valve regurgitation is mild to moderate. Aortic Valve: The aortic valve is calcified. Aortic valve regurgitation is mild. No aortic stenosis is present. Pulmonic Valve: The pulmonic valve was normal in structure. Pulmonic valve regurgitation is not visualized. No evidence of pulmonic stenosis. Aorta: The aortic root is normal in size and structure. IAS/Shunts: The interatrial septum was not assessed.  LEFT VENTRICLE PLAX 2D LVIDd:         4.80 cm      Diastology  LVIDs:         3.60 cm      LV e' medial:    5.98 cm/s LV PW:         1.00 cm      LV E/e' medial:  11.3 LV IVS:        1.00 cm      LV e' lateral:   6.31 cm/s LVOT diam:     2.60 cm      LV E/e' lateral: 10.7 LV SV:         83 LV SV Index:   42 LVOT Area:     5.31 cm  LV Volumes  (MOD) LV vol d, MOD A2C: 99.4 ml LV vol d, MOD A4C: 114.0 ml LV vol s, MOD A2C: 61.8 ml LV vol s, MOD A4C: 68.5 ml LV SV MOD A2C:     37.6 ml LV SV MOD A4C:     114.0 ml LV SV MOD BP:      45.8 ml RIGHT VENTRICLE             IVC RV S prime:     14.70 cm/s  IVC diam: 1.90 cm TAPSE (M-mode): 2.4 cm LEFT ATRIUM             Index        RIGHT ATRIUM           Index LA diam:        3.40 cm 1.72 cm/m   RA Area:     13.50 cm LA Vol (A2C):   33.0 ml 16.70 ml/m  RA Volume:   32.70 ml  16.55 ml/m LA Vol (A4C):   26.4 ml 13.36 ml/m LA Biplane Vol: 32.2 ml 16.30 ml/m  AORTIC VALVE LVOT Vmax:   79.60 cm/s LVOT Vmean:  49.900 cm/s LVOT VTI:    0.157 m  AORTA Ao Root diam: 3.40 cm MITRAL VALVE               TRICUSPID VALVE MV Area (PHT): 4.17 cm    TR Peak grad:   36.7 mmHg MV Decel Time: 182 msec    TR Vmax:        303.00 cm/s MV E velocity: 67.70 cm/s MV A velocity: 85.70 cm/s  SHUNTS MV E/A ratio:  0.79        Systemic VTI:  0.16 m                            Systemic Diam: 2.60 cm Truett Mainland MD Electronically signed by Truett Mainland MD Signature Date/Time: 09/21/2021/11:53:08 AM    Final (Updated)    CARDIAC CATHETERIZATION  Result Date: 09/21/2021 Images from the original result were not included. LM: Normal LAD: Tortuous. Rapid taper distally. No significant disease Lcx: Large, dominant, tortuous.        Distal 50% stenosis Ramus: Extreme tortuous takeoff. 95% stenosis RCA: Small, non-dominant. Mid 95% stenosis, best treated medically. Attempted to wire the vessel but low chance of successful intervention due to extreme tortuosity. Aborted attempt after 5 min. No complication noted. Recommend medical management. Elder Negus, MD Pager: [phone] Office: [phone]  CT Angio Chest/Abd/Pel for Dissection W and/or W/WO  Result Date: 09/20/2021 CLINICAL DATA:  Right chest pain, evaluate for dissection EXAM: CT ANGIOGRAPHY CHEST, ABDOMEN AND PELVIS TECHNIQUE: Non-contrast CT of the chest was  initially obtained. Multidetector CT imaging through the chest, abdomen and pelvis was performed using  the standard protocol during bolus administration of intravenous contrast. Multiplanar reconstructed images and MIPs were obtained and reviewed to evaluate the vascular anatomy. RADIATION DOSE REDUCTION: This exam was performed according to the departmental dose-optimization program which includes automated exposure control, adjustment of the mA and/or kV according to patient size and/or use of iterative reconstruction technique. CONTRAST:  OMNIPAQUE IOHEXOL 350 MG/ML SOLN COMPARISON:  CT abdomen/pelvis dated 06/10/2021. CT chest dated 01/20/2021. FINDINGS: CTA CHEST FINDINGS Cardiovascular: On unenhanced CT, there is no evidence of intramural hematoma. Following contrast administration, there is preferential opacification of the thoracic aorta. No evidence of thoracic aortic aneurysm or dissection. Atherosclerotic calcifications of the aortic arch. Study is not tailored for evaluation of the pulmonary arteries. No evidence of central pulmonary embolism. Heart is normal in size. No pericardial effusion. Mild coronary atherosclerosis of the LAD. Mediastinum/Nodes: No suspicious mediastinal lymphadenopathy. Visualized thyroid is unremarkable. Lungs/Pleura: Moderate centrilobular and paraseptal emphysematous changes, upper lung predominant. No suspicious pulmonary nodules. Minimal bibasilar atelectasis. No focal consolidation. No pleural effusion or pneumothorax. Musculoskeletal: Right shoulder arthroplasty. Degenerative changes of the thoracic spine. No fracture is seen. Review of the MIP images confirms the above findings. CTA ABDOMEN AND PELVIS FINDINGS VASCULAR Aorta: No evidence abdominal aortic aneurysm or dissection. Atherosclerotic calcifications. Patent. Celiac: Patent. SMA: Patent. Atherosclerotic calcifications. Renals: Patent bilaterally. Atherosclerotic calcifications at the origin of the left renal  artery. IMA: Patent. Inflow: Patent bilaterally. Atherosclerotic calcifications. Veins: Unremarkable. Review of the MIP images confirms the above findings. NON-VASCULAR Hepatobiliary: Liver is within normal limits. Status post cholecystectomy. No intrahepatic or extrahepatic ductal dilatation. Pancreas: Within normal limits. Spleen: Within normal limits. Adrenals/Urinary Tract: Adrenal glands are within normal limits. Bilateral renal cysts, measuring up to 6.1 cm in the anterior left lower kidney (series 7/image 26). No hydronephrosis. Bladder is within normal limits. Stomach/Bowel: Stomach is within normal limits. No evidence of bowel obstruction. Normal appendix (series 7/image 198). Extensive sigmoid diverticulosis, without evidence of diverticulitis. Lymphatic: No suspicious abdominopelvic lymphadenopathy. Reproductive: Mild prostatomegaly. Other: No abdominopelvic ascites. Musculoskeletal: Degenerative changes of the lumbar spine. Review of the MIP images confirms the above findings. IMPRESSION: No evidence of thoracoabdominal aortic aneurysm or dissection. No evidence of acute cardiopulmonary disease. Extensive left colonic diverticulosis, without evidence of diverticulitis. Additional ancillary findings as above. Electronically Signed   By: Charline Bills M.D.   On: 09/20/2021 21:37   CT Head Wo Contrast  Result Date: 09/20/2021 CLINICAL DATA:  Near syncope, altered mental status EXAM: CT HEAD WITHOUT CONTRAST CT CERVICAL SPINE WITHOUT CONTRAST TECHNIQUE: Multidetector CT imaging of the head and cervical spine was performed following the standard protocol without intravenous contrast. Multiplanar CT image reconstructions of the cervical spine were also generated. RADIATION DOSE REDUCTION: This exam was performed according to the departmental dose-optimization program which includes automated exposure control, adjustment of the mA and/or kV according to patient size and/or use of iterative  reconstruction technique. COMPARISON:  01/20/2021 FINDINGS: CT HEAD FINDINGS Brain: No evidence of acute infarction, hemorrhage, hydrocephalus, extra-axial collection or mass lesion/mass effect. Subcortical white matter and periventricular small vessel ischemic changes. Mild chronic left hemispheric atrophy with prominent extra-axial CSF. Vascular: Mild intracranial atherosclerosis. Skull: Normal. Negative for fracture or focal lesion. Sinuses/Orbits: The visualized paranasal sinuses are essentially clear. The mastoid air cells are unopacified. Other: None. CT CERVICAL SPINE FINDINGS Alignment: Reversal of the normal cervical lordosis. Skull base and vertebrae: No acute fracture. No primary bone lesion or focal pathologic process. Soft tissues and spinal canal: No prevertebral fluid  or swelling. No visible canal hematoma. Disc levels: Mild to moderate degenerative changes of the mid cervical spine. Spinal canal is patent. Upper chest: Evaluated on dedicated CTA chest. Other: None. IMPRESSION: No evidence of acute intracranial abnormality. Atrophy with small vessel ischemic changes. No evidence of traumatic injury to the cervical spine. Mild to moderate degenerative changes. Electronically Signed   By: Charline Bills M.D.   On: 09/20/2021 21:27   CT Cervical Spine Wo Contrast  Result Date: 09/20/2021 CLINICAL DATA:  Near syncope, altered mental status EXAM: CT HEAD WITHOUT CONTRAST CT CERVICAL SPINE WITHOUT CONTRAST TECHNIQUE: Multidetector CT imaging of the head and cervical spine was performed following the standard protocol without intravenous contrast. Multiplanar CT image reconstructions of the cervical spine were also generated. RADIATION DOSE REDUCTION: This exam was performed according to the departmental dose-optimization program which includes automated exposure control, adjustment of the mA and/or kV according to patient size and/or use of iterative reconstruction technique. COMPARISON:  01/20/2021  FINDINGS: CT HEAD FINDINGS Brain: No evidence of acute infarction, hemorrhage, hydrocephalus, extra-axial collection or mass lesion/mass effect. Subcortical white matter and periventricular small vessel ischemic changes. Mild chronic left hemispheric atrophy with prominent extra-axial CSF. Vascular: Mild intracranial atherosclerosis. Skull: Normal. Negative for fracture or focal lesion. Sinuses/Orbits: The visualized paranasal sinuses are essentially clear. The mastoid air cells are unopacified. Other: None. CT CERVICAL SPINE FINDINGS Alignment: Reversal of the normal cervical lordosis. Skull base and vertebrae: No acute fracture. No primary bone lesion or focal pathologic process. Soft tissues and spinal canal: No prevertebral fluid or swelling. No visible canal hematoma. Disc levels: Mild to moderate degenerative changes of the mid cervical spine. Spinal canal is patent. Upper chest: Evaluated on dedicated CTA chest. Other: None. IMPRESSION: No evidence of acute intracranial abnormality. Atrophy with small vessel ischemic changes. No evidence of traumatic injury to the cervical spine. Mild to moderate degenerative changes. Electronically Signed   By: Charline Bills M.D.   On: 09/20/2021 21:27   DG Chest Port 1 View  Result Date: 09/20/2021 CLINICAL DATA:  Chest pain. EXAM: PORTABLE CHEST 1 VIEW COMPARISON:  June 10, 2021. FINDINGS: The heart size and mediastinal contours are within normal limits. Both lungs are clear. Status post right shoulder arthroplasty. IMPRESSION: No active disease. Electronically Signed   By: Lupita Raider M.D.   On: 09/20/2021 18:45   DG Chest 2 View  Result Date: 12/19/2022 CLINICAL DATA:  Productive cough, shortness of breath EXAM: CHEST - 2 VIEW COMPARISON:  Chest radiograph 09/20/2021 FINDINGS: The cardiomediastinal silhouette is normal. There is no focal consolidation or pulmonary edema. There is no pleural effusion or pneumothorax There is no acute osseous  abnormality. Right shoulder arthroplasty hardware is noted. IMPRESSION: Stable chest with no radiographic evidence of acute cardiopulmonary process. Electronically Signed   By: Lesia Hausen M.D.   On: 12/19/2022 12:22     Assessment & Plan:  Benign essential HTN -     CBC with Differential/Platelet; Future -     TSH; Future -     Basic metabolic panel; Future  Vitamin B12 deficiency anemia due to intrinsic factor deficiency -     CBC with Differential/Platelet; Future -     Folate; Future -     Cyanocobalamin  Hyperlipidemia with target LDL less than 130 -     Lipid panel; Future -     Atorvastatin Calcium; Take 1 tablet (40 mg total) by mouth daily.  Dispense: 90 tablet; Refill: 1  Subacute cough -  DG Chest 2 View; Future  DOE (dyspnea on exertion) -     Brain natriuretic peptide; Future -     Troponin I (High Sensitivity); Future -     EKG 12-Lead  Bradycardia- He is symptomatic.  Will hold the beta-blocker. -     TSH; Future -     Ambulatory referral to Cardiology -     EKG 12-Lead  Abnormal electrocardiogram (ECG) (EKG) -     Brain natriuretic peptide; Future -     Troponin I (High Sensitivity); Future  Chronic iron deficiency anemia -     IBC + Ferritin; Future -     ACCRUFeR; Take 1 capsule (30 mg total) by mouth in the morning and at bedtime.  Dispense: 180 capsule; Refill: 1  Left lumbar radiculopathy -     oxyCODONE-Acetaminophen; Take 1 tablet by mouth every 6 (six) hours as needed for pain.  Dispense: 100 tablet; Refill: 0  Generalized OA -     oxyCODONE-Acetaminophen; Take 1 tablet by mouth every 6 (six) hours as needed for pain.  Dispense: 100 tablet; Refill: 0  Chronic combined systolic and diastolic congestive heart failure, NYHA class 1 (HCC) -     Sacubitril-Valsartan; Take 1 tablet by mouth 2 (two) times daily.  Dispense: 180 tablet; Refill: 0     Follow-up: Return in about 3 months (around 03/21/2023).  Sanda Linger, MD

## 2022-12-20 ENCOUNTER — Other Ambulatory Visit: Payer: Self-pay

## 2022-12-20 ENCOUNTER — Ambulatory Visit: Payer: Medicare Other | Admitting: Family Medicine

## 2022-12-20 ENCOUNTER — Encounter: Payer: Self-pay | Admitting: Family Medicine

## 2022-12-20 VITALS — BP 124/82 | HR 82 | Ht 69.0 in | Wt 184.0 lb

## 2022-12-20 DIAGNOSIS — M7062 Trochanteric bursitis, left hip: Secondary | ICD-10-CM

## 2022-12-20 DIAGNOSIS — M12812 Other specific arthropathies, not elsewhere classified, left shoulder: Secondary | ICD-10-CM

## 2022-12-20 DIAGNOSIS — M75102 Unspecified rotator cuff tear or rupture of left shoulder, not specified as traumatic: Secondary | ICD-10-CM

## 2022-12-20 DIAGNOSIS — M7061 Trochanteric bursitis, right hip: Secondary | ICD-10-CM

## 2022-12-20 DIAGNOSIS — M25552 Pain in left hip: Secondary | ICD-10-CM

## 2022-12-20 NOTE — Assessment & Plan Note (Signed)
Patient given patient tolerated the procedure well.  Did have more for conservative therapy with patient having other significant comorbidities.  Uses his arms significantly for transfer and secondary to having even weakness in the lower extremities.  Discussed icing regimen and home exercises.  Discussed increasing activity slowly.  Will follow-up with me again in 8-12 weeks otherwise

## 2022-12-20 NOTE — Assessment & Plan Note (Signed)
Bilateral injections given.  Had been seen 3 months ago for very similar presentation.  A1c is normal at 5.6.  Patient has lost some weight.  Has seen multiple other providers.  Do not want to do anything more invasive.  Feels that this will be helpful in getting some home exercises.  Discussed icing regimen.  No groin pain.  Follow-up with me again in 6 to 8 weeks

## 2022-12-20 NOTE — Progress Notes (Signed)
Adrian Miller [address] [place] [address] Phone: [phone] Subjective:   Adrian Miller, am serving as a scribe for Dr. Antoine Miller.  I'm seeing this patient by the request  of:  Adrian Grandchild, MD  CC: Hip pain follow-up  IEP:[id number]  Adrian Miller is a 82 y.o. male coming in with complaint of hip pain.  Has been quite sometime since we have seen this patient.  Has also been seen by primary care recently for more of a left lumbar radiculopathy. Wants injections in B hips and L shoulder. Chronic pain.       Past Medical History:  Diagnosis Date   Acute esophagitis 06/01/2015   Arthritis    Benign essential HTN 05/31/2015   Duodenal ulcer hemorrhage    Esophageal stricture 06/01/2015   Gastric ulcer    Hiatal hernia 06/01/2015   Past Surgical History:  Procedure Laterality Date   BIOPSY  05/20/2020   Procedure: BIOPSY;  Surgeon: Shellia Cleverly, DO;  Location: [facility];  Service: Gastroenterology;;   CHOLECYSTECTOMY N/A 09/30/2017   Procedure: LAPAROSCOPIC CHOLECYSTECTOMY WITH INTRAOPERATIVE CHOLANGIOGRAM;  Surgeon: Glenna Fellows, MD;  Location: [facility];  Service: General;  Laterality: N/A;   COLONOSCOPY     ESOPHAGOGASTRODUODENOSCOPY N/A 05/31/2015   Procedure: ESOPHAGOGASTRODUODENOSCOPY (EGD);  Surgeon: Meryl Dare, MD;  Location: [facility];  Service: Endoscopy;  Laterality: N/A;   ESOPHAGOGASTRODUODENOSCOPY (EGD) WITH PROPOFOL N/A 05/20/2020   Procedure: ESOPHAGOGASTRODUODENOSCOPY (EGD) WITH PROPOFOL;  Surgeon: Shellia Cleverly, DO;  Location: [facility];  Service: Gastroenterology;  Laterality: N/A;   HERNIA REPAIR  1975   LEFT HEART CATH AND CORONARY ANGIOGRAPHY N/A 09/21/2021   Procedure: LEFT HEART CATH AND CORONARY ANGIOGRAPHY;  Surgeon: Elder Negus, MD;  Location: [facility];  Service: Cardiovascular;  Laterality: N/A;   LEFT ROTATOR CUFF REPAIR  2002   REPLACEMENT TOTAL KNEE Left     REVERSE SHOULDER ARTHROPLASTY Right 03/20/2018   Procedure: RIGHT REVERSE SHOULDER ARTHROPLASTY;  Surgeon: Jones Broom, MD;  Location: [facility];  Service: Orthopedics;  Laterality: Right;   TOTAL KNEE ARTHROPLASTY Right 02/22/2015   Procedure: TOTAL KNEE ARTHROPLASTY;  Surgeon: Marcene Corning, MD;  Location: [facility];  Service: Orthopedics;  Laterality: Right;   Social History   Socioeconomic History   Marital status: Single    Spouse name: Not on file   Number of children: Not on file   Years of education: Not on file   Highest education level: Not on file  Occupational History   Not on file  Tobacco Use   Smoking status: Every Day    Current packs/day: 0.50    Average packs/day: 0.5 packs/day for 51.0 years (25.5 ttl pk-yrs)    Types: Cigarettes   Smokeless tobacco: Former    Quit date: 11/03/2014  Vaping Use   Vaping status: Never Used  Substance and Sexual Activity   Alcohol use: No    Alcohol/week: 0.0 standard drinks of alcohol   Drug use: No   Sexual activity: Never  Other Topics Concern   Not on file  Social History Narrative   Not on file   Social Determinants of Health   Financial Resource Strain: Low Risk  (12/25/2021)   Overall Financial Resource Strain (CARDIA)    Difficulty of Paying Living Expenses: Not hard at all  Food Insecurity: No Food Insecurity (12/25/2021)   Hunger Vital Sign    Worried About Running Out of Food in the  Last Year: Never true    Ran Out of Food in the Last Year: Never true  Transportation Needs: No Transportation Needs (12/25/2021)   PRAPARE - Administrator, Civil Service (Medical): No    Lack of Transportation (Non-Medical): No  Physical Activity: Sufficiently Active (12/25/2021)   Exercise Vital Sign    Days of Exercise per Week: 5 days    Minutes of Exercise per Session: 30 min  Stress: No Stress Concern Present (12/25/2021)   [employer] of Occupational Health - Occupational Stress Questionnaire    Feeling of  Stress : Not at all  Social Connections: Socially Isolated (12/25/2021)   Social Connection and Isolation Panel [NHANES]    Frequency of Communication with Friends and Family: Three times a week    Frequency of Social Gatherings with Friends and Family: Three times a week    Attends Religious Services: Never    Active Member of Clubs or Organizations: No    Attends Banker Meetings: Never    Marital Status: Widowed   Allergies  Allergen Reactions   Asa [Aspirin] Other (See Comments)    Caused rectal bleeding   [name] [Aspirin-Salicylamide-Caffeine] Other (See Comments)    Caused rectal bleeding   Family History  Problem Relation Age of Onset   Hypertension Mother    Heart attack Sister    Colon cancer Neg Hx    Esophageal cancer Neg Hx    Rectal cancer Neg Hx    Stomach cancer Neg Hx      Current Outpatient Medications (Cardiovascular):    atorvastatin (LIPITOR) 40 MG tablet, Take 1 tablet (40 mg total) by mouth daily.   metoprolol succinate (TOPROL-XL) 25 MG 24 hr tablet, Take 0.5 tablets (12.5 mg total) by mouth daily.   nitroGLYCERIN (NITROSTAT) 0.4 MG SL tablet, Place 1 tablet (0.4 mg total) under the tongue every 5 (five) minutes as needed for chest pain.   sacubitril-valsartan (ENTRESTO) 24-26 MG, Take 1 tablet by mouth 2 (two) times daily.   Current Outpatient Medications (Analgesics):    aspirin EC 81 MG tablet, Take 1 tablet (81 mg total) by mouth daily.   oxyCODONE-acetaminophen (PERCOCET) 10-325 MG tablet, Take 1 tablet by mouth every 6 (six) hours as needed for pain.  Current Outpatient Medications (Hematological):    clopidogrel (PLAVIX) 75 MG tablet, Take 1 tablet (75 mg total) by mouth daily.   Ferric Maltol (ACCRUFER) 30 MG CAPS, Take 1 capsule (30 mg total) by mouth in the morning and at bedtime.  Current Outpatient Medications (Other):    KLOR-CON M20 20 MEQ tablet, TAKE 1 TABLET BY MOUTH TWICE A DAY   pantoprazole (PROTONIX) 40  MG tablet, TAKE 1 TABLET BY MOUTH EVERY DAY (Patient taking differently: 40 mg every morning.)   Reviewed prior external information including notes and imaging from  primary care provider As well as notes that were available from care everywhere and other healthcare systems.  Past medical history, social, surgical and family history all reviewed in electronic medical record.  No pertanent information unless stated regarding to the chief complaint.   Review of Systems:  No headache, visual changes, nausea, vomiting, diarrhea, constipation, dizziness, abdominal pain, skin rash, fevers, chills, night sweats, weight loss, swollen lymph nodes, body aches, joint swelling, chest pain, shortness of breath, mood changes. POSITIVE muscle aches  Objective  Blood pressure 124/82, pulse 82, height 5\' 9"  (1.753 m), weight 184 lb (83.5 kg), SpO2 97%.   General: No  apparent distress alert and oriented x3 mood and affect normal, dressed appropriately.  HEENT: Pupils equal, extraocular movements intact  Respiratory: Patient's speak in full sentences and does not appear short of breath  Cardiovascular: No lower extremity edema, non tender, no erythema  Antalgic gait walking with the aid of a cane.  Patient does have severe tenderness to palpation over the greater trochanteric areas bilaterally.  Negative straight leg test noted.  Does have some peripheral neuropathy noted.  Left shoulder does have significant arthritic changes noted.  Crepitus noted.  Does have weakness noted of the rotator cuff fairly significantly.  Left shoulder is negative shoulder compared to his right shoulder that is a replacement.  Procedure: Real-time Ultrasound Guided Injection of left glenohumeral joint Device: GE Logiq E  Ultrasound guided injection is preferred based studies that show increased duration, increased effect, greater accuracy, decreased procedural pain, increased response rate with ultrasound guided versus blind  injection.  Verbal informed consent obtained.  Time-out conducted.  Noted no overlying erythema, induration, or other signs of local infection.  Skin prepped in a sterile fashion.  Local anesthesia: Topical Ethyl chloride.  With sterile technique and under real time ultrasound guidance:  Joint visualized.  21g 2 inch needle inserted posterior approach. Pictures taken for needle placement. Patient did have injection of 2 cc of 0.5% Marcaine, and 1cc of Kenalog 40 mg/dL. Completed without difficulty  Pain immediately resolved suggesting accurate placement of the medication.  Advised to call if fevers/chills, erythema, induration, drainage, or persistent bleeding.  Impression: Technically successful ultrasound guided injection.    Procedure: Real-time Ultrasound Guided Injection of right greater trochanteric bursitis secondary to patient's body habitus Device: GE Logiq Q7 Ultrasound guided injection is preferred based studies that show increased duration, increased effect, greater accuracy, decreased procedural pain, increased response rate, and decreased cost with ultrasound guided versus blind injection.  Verbal informed consent obtained.  Time-out conducted.  Noted no overlying erythema, induration, or other signs of local infection.  Skin prepped in a sterile fashion.  Local anesthesia: Topical Ethyl chloride.  With sterile technique and under real time ultrasound guidance:  Greater trochanteric area was visualized and patient's bursa was noted. A 22-gauge 3 inch needle was inserted and 4 cc of 0.5% Marcaine and 1 cc of Kenalog 40 mg/dL was injected. Pictures taken Completed without difficulty  Pain immediately resolved suggesting accurate placement of the medication.  Advised to call if fevers/chills, erythema, induration, drainage, or persistent bleeding.  Impression: Technically successful ultrasound guided injection.   Procedure: Real-time Ultrasound Guided Injection of left   greater trochanteric bursitis secondary to patient's body habitus Device: GE Logiq Q7  Ultrasound guided injection is preferred based studies that show increased duration, increased effect, greater accuracy, decreased procedural pain, increased response rate, and decreased cost with ultrasound guided versus blind injection.  Verbal informed consent obtained.  Time-out conducted.  Noted no overlying erythema, induration, or other signs of local infection.  Skin prepped in a sterile fashion.  Local anesthesia: Topical Ethyl chloride.  With sterile technique and under real time ultrasound guidance:  Greater trochanteric area was visualized and patient's bursa was noted. A 22-gauge 3 inch needle was inserted and 4 cc of 0.5% Marcaine and 1 cc of Kenalog 40 mg/dL was injected. Pictures taken Completed without difficulty  Pain immediately resolved suggesting accurate placement of the medication.  Advised to call if fevers/chills, erythema, induration, drainage, or persistent bleeding.  Impression: Technically successful ultrasound guided injection.   Impression and Recommendations:  The above documentation has been reviewed and is accurate and complete Judi Saa, DO

## 2022-12-20 NOTE — Patient Instructions (Addendum)
Injected hip and L shoulder See me again in 3-6 months

## 2022-12-21 ENCOUNTER — Telehealth: Payer: Self-pay | Admitting: Internal Medicine

## 2022-12-21 DIAGNOSIS — I5042 Chronic combined systolic (congestive) and diastolic (congestive) heart failure: Secondary | ICD-10-CM | POA: Insufficient documentation

## 2022-12-21 MED ORDER — SACUBITRIL-VALSARTAN 24-26 MG PO TABS: 1.0000 | ORAL_TABLET | Freq: Two times a day (BID) | ORAL | 0 refills | Status: AC

## 2022-12-21 MED ORDER — ATORVASTATIN CALCIUM 40 MG PO TABS
40.0000 mg | ORAL_TABLET | Freq: Every day | ORAL | 1 refills | Status: DC
Start: 2022-12-21 — End: 2023-06-27

## 2022-12-21 MED ORDER — OXYCODONE-ACETAMINOPHEN 10-325 MG PO TABS
1.0000 | ORAL_TABLET | Freq: Four times a day (QID) | ORAL | 0 refills | Status: DC | PRN
Start: 2022-12-21 — End: 2023-01-22

## 2022-12-21 NOTE — Telephone Encounter (Signed)
Prescription Request  12/21/2022  LOV: 12/19/2022  What is the name of the medication or equipment?  oxyCODONE-acetaminophen (PERCOCET) 10-325 MG tablet   Have you contacted your pharmacy to request a refill? No   Which pharmacy would you like this sent to?  [id number] [place], [place] - [address] [place] [address] Phone: [phone] Fax: [phone]    Patient notified that their request is being sent to the clinical staff for review and that they should receive a response within 2 business days.   Please advise at [facility] [phone]

## 2022-12-26 ENCOUNTER — Ambulatory Visit (INDEPENDENT_AMBULATORY_CARE_PROVIDER_SITE_OTHER): Payer: Medicare Other

## 2022-12-26 VITALS — Ht 69.0 in | Wt 184.0 lb

## 2022-12-26 DIAGNOSIS — Z0000 Encounter for general adult medical examination without abnormal findings: Secondary | ICD-10-CM

## 2022-12-26 NOTE — Patient Instructions (Addendum)
Adrian Miller , Thank you for taking time to come for your Medicare Wellness Visit. I appreciate your ongoing commitment to your health goals. Please review the following plan we discussed and let me know if I can assist you in the future.   Referrals/Orders/Follow-Ups/Clinician Recommendations: No  This is a list of the screening recommended for you and due dates:  Health Maintenance  Topic Date Due   Yearly kidney health urinalysis for diabetes  Never done   Zoster (Shingles) Vaccine (1 of 2) 05/08/1991   Eye exam for diabetics  02/25/2015   DTaP/Tdap/Td vaccine (2 - Td or Tdap) 11/13/2020   COVID-19 Vaccine (2 - 2023-24 season) 01/05/2022   Flu Shot  12/06/2022   Hemoglobin A1C  02/07/2023   Complete foot exam   04/05/2023   Yearly kidney function blood test for diabetes  12/19/2023   Medicare Annual Wellness Visit  12/26/2023   Pneumonia Vaccine  Completed   HPV Vaccine  Aged Out   Screening for Lung Cancer  Discontinued   Hepatitis C Screening  Discontinued    Advanced directives: (Copy Requested) Please bring a copy of your health care power of attorney and living will to the office to be added to your chart at your convenience.  Next Medicare Annual Wellness Visit scheduled for next year: Yes  Preventive Care attachment FALL PREVENTION attachment

## 2022-12-26 NOTE — Progress Notes (Signed)
Subjective:   Adrian Miller is a 82 y.o. male who presents for Medicare Annual/Subsequent preventive examination.  Visit Complete: Virtual  I connected with  Adrian Miller on 12/26/22 by a audio enabled telemedicine application and verified that I am speaking with the correct person using two identifiers.  Patient Location: Home  Provider Location: Home Office  I discussed the limitations of evaluation and management by telemedicine. The patient expressed understanding and agreed to proceed.  Vital Signs: Because this visit was a virtual/telehealth visit, some criteria may be missing or patient reported. Any vitals not documented were not able to be obtained and vitals that have been documented are patient reported.    Review of Systems     Cardiac Risk Factors include: advanced age (>43men, >76 women);dyslipidemia;family history of premature cardiovascular disease;hypertension     Objective:    Today's Vitals   12/26/22 1034  Weight: 184 lb (83.5 kg)  Height: 5\' 9"  (1.753 m)  PainSc: 10-Worst pain ever  PainLoc: Back   Body mass index is 27.17 kg/m.     12/26/2022   10:37 AM 12/25/2021   10:40 AM 09/20/2021    6:21 PM 06/10/2021    7:38 AM 07/11/2020    9:40 AM 05/19/2020    5:46 PM 08/20/2018    2:32 PM  Advanced Directives  Does Patient Have a Medical Advance Directive? Yes No Unable to assess, patient is non-responsive or altered mental status No No No No  Type of Estate agent of [place];Living will        Copy of Healthcare Power of Attorney in Chart? No - copy requested        Would patient like information on creating a medical advance directive?  No - Patient declined  No - Patient declined No - Patient declined No - Patient declined     Current Medications (verified) Outpatient Encounter Medications as of 12/26/2022  Medication Sig   aspirin EC 81 MG tablet Take 1 tablet (81 mg total) by mouth daily.   atorvastatin (LIPITOR) 40 MG tablet  Take 1 tablet (40 mg total) by mouth daily.   clopidogrel (PLAVIX) 75 MG tablet Take 1 tablet (75 mg total) by mouth daily.   Ferric Maltol (ACCRUFER) 30 MG CAPS Take 1 capsule (30 mg total) by mouth in the morning and at bedtime.   KLOR-CON M20 20 MEQ tablet TAKE 1 TABLET BY MOUTH TWICE A DAY   nitroGLYCERIN (NITROSTAT) 0.4 MG SL tablet Place 1 tablet (0.4 mg total) under the tongue every 5 (five) minutes as needed for chest pain.   oxyCODONE-acetaminophen (PERCOCET) 10-325 MG tablet Take 1 tablet by mouth every 6 (six) hours as needed for pain.   pantoprazole (PROTONIX) 40 MG tablet TAKE 1 TABLET BY MOUTH EVERY DAY (Patient taking differently: 40 mg every morning.)   sacubitril-valsartan (ENTRESTO) 24-26 MG Take 1 tablet by mouth 2 (two) times daily.   No facility-administered encounter medications on file as of 12/26/2022.    Allergies (verified) Asa [aspirin] and [name] [aspirin-salicylamide-caffeine]   History: Past Medical History:  Diagnosis Date   Acute esophagitis 06/01/2015   Arthritis    Benign essential HTN 05/31/2015   Duodenal ulcer hemorrhage    Esophageal stricture 06/01/2015   Gastric ulcer    Hiatal hernia 06/01/2015   Past Surgical History:  Procedure Laterality Date   BIOPSY  05/20/2020   Procedure: BIOPSY;  Surgeon: Shellia Cleverly, DO;  Location: [facility];  Service: Gastroenterology;;   CHOLECYSTECTOMY N/A 09/30/2017   Procedure: LAPAROSCOPIC CHOLECYSTECTOMY WITH INTRAOPERATIVE CHOLANGIOGRAM;  Surgeon: Glenna Fellows, MD;  Location: [facility];  Service: General;  Laterality: N/A;   COLONOSCOPY     ESOPHAGOGASTRODUODENOSCOPY N/A 05/31/2015   Procedure: ESOPHAGOGASTRODUODENOSCOPY (EGD);  Surgeon: Meryl Dare, MD;  Location: [facility];  Service: Endoscopy;  Laterality: N/A;   ESOPHAGOGASTRODUODENOSCOPY (EGD) WITH PROPOFOL N/A 05/20/2020   Procedure: ESOPHAGOGASTRODUODENOSCOPY (EGD) WITH PROPOFOL;  Surgeon: Shellia Cleverly, DO;  Location: [facility];  Service: Gastroenterology;  Laterality: N/A;   HERNIA REPAIR  1975   LEFT HEART CATH AND CORONARY ANGIOGRAPHY N/A 09/21/2021   Procedure: LEFT HEART CATH AND CORONARY ANGIOGRAPHY;  Surgeon: Elder Negus, MD;  Location: [facility];  Service: Cardiovascular;  Laterality: N/A;   LEFT ROTATOR CUFF REPAIR  2002   REPLACEMENT TOTAL KNEE Left    REVERSE SHOULDER ARTHROPLASTY Right 03/20/2018   Procedure: RIGHT REVERSE SHOULDER ARTHROPLASTY;  Surgeon: Jones Broom, MD;  Location: [facility] OR;  Service: Orthopedics;  Laterality: Right;   TOTAL KNEE ARTHROPLASTY Right 02/22/2015   Procedure: TOTAL KNEE ARTHROPLASTY;  Surgeon: Marcene Corning, MD;  Location: [facility] OR;  Service: Orthopedics;  Laterality: Right;   Family History  Problem Relation Age of Onset   Hypertension Mother    Heart attack Sister    Colon cancer Neg Hx    Esophageal cancer Neg Hx    Rectal cancer Neg Hx    Stomach cancer Neg Hx    Social History   Socioeconomic History   Marital status: Single    Spouse name: Not on file   Number of children: Not on file   Years of education: Not on file   Highest education level: Not on file  Occupational History   Not on file  Tobacco Use   Smoking status: Every Day    Current packs/day: 0.50    Average packs/day: 0.5 packs/day for 51.0 years (25.5 ttl pk-yrs)    Types: Cigarettes   Smokeless tobacco: Former    Quit date: 11/03/2014  Vaping Use   Vaping status: Never Used  Substance and Sexual Activity   Alcohol use: No    Alcohol/week: 0.0 standard drinks of alcohol   Drug use: No   Sexual activity: Never  Other Topics Concern   Not on file  Social History Narrative   Not on file   Social Determinants of Health   Financial Resource Strain: Low Risk  (12/26/2022)   Overall Financial Resource Strain (CARDIA)    Difficulty of Paying Living Expenses: Not hard at all  Food Insecurity: No Food Insecurity (12/26/2022)   Hunger Vital Sign    Worried  About Running Out of Food in the Last Year: Never true    Ran Out of Food in the Last Year: Never true  Transportation Needs: No Transportation Needs (12/26/2022)   PRAPARE - Administrator, Civil Service (Medical): No    Lack of Transportation (Non-Medical): No  Physical Activity: Inactive (12/26/2022)   Exercise Vital Sign    Days of Exercise per Week: 0 days    Minutes of Exercise per Session: 0 min  Stress: No Stress Concern Present (12/26/2022)   [employer] of Occupational Health - Occupational Stress Questionnaire    Feeling of Stress : Not at all  Social Connections: Socially Isolated (12/26/2022)   Social Connection and Isolation Panel [NHANES]    Frequency of Communication with Friends and Family: Three times a week  Frequency of Social Gatherings with Friends and Family: Three times a week    Attends Religious Services: Never    Active Member of Clubs or Organizations: No    Attends Banker Meetings: Never    Marital Status: Widowed    Tobacco Counseling Ready to quit: Not Answered Counseling given: Not Answered   Clinical Intake:  Pre-visit preparation completed: Yes  Pain : 0-10 Pain Score: 10-Worst pain ever Pain Type: Chronic pain Pain Location: Back Pain Orientation: Lower     BMI - recorded: 27.17 Nutritional Status: BMI 25 -29 Overweight Nutritional Risks: None Diabetes: No  How often do you need to have someone help you when you read instructions, pamphlets, or other written materials from your doctor or pharmacy?: 1 - Never What is the last grade level you completed in school?: HSG; [employer] V[employer]  Interpreter Needed?: No  Information entered by :: Susie Cassette, LPN.   Activities of Daily Living    12/26/2022   10:41 AM  In your present state of health, do you have any difficulty performing the following activities:  Hearing? 1  Vision? 0  Difficulty concentrating or making decisions? 1  Walking or climbing  stairs? 1  Dressing or bathing? 0  Doing errands, shopping? 0  Preparing Food and eating ? N  Using the Toilet? N  In the past six months, have you accidently leaked urine? N  Do you have problems with loss of bowel control? N  Managing your Medications? N  Managing your Finances? N  Housekeeping or managing your Housekeeping? N    Patient Care Team: Etta Grandchild, MD as PCP - General (Internal Medicine)  Indicate any recent Medical Services you may have received from other than [facility] providers in the past year (date may be approximate).     Assessment:   This is a routine wellness examination for Kenaz.  Hearing/Vision screen Hearing Screening - Comments:: Patient has hearing difficulties; no hearing aids.   Vision Screening - Comments:: Wears rx glasses - not up to date with routine eye exams.   Dietary issues and exercise activities discussed:     Goals Addressed   None   Depression Screen    12/26/2022   10:40 AM 12/26/2021    8:33 AM 12/25/2021   10:40 AM 12/25/2021   10:35 AM 06/27/2021    1:14 PM 12/02/2018    8:16 AM 08/09/2017    8:35 AM  PHQ 2/9 Scores  PHQ - 2 Score 0 0 0 0 0 0 0  PHQ- 9 Score 2 0         Fall Risk    12/26/2022   10:40 AM 12/25/2021   10:42 AM 12/25/2021   10:41 AM 06/27/2021    1:14 PM 12/02/2018    8:16 AM  Fall Risk   Falls in the past year? 0 0 0 0 0  Number falls in past yr: 0 0 0  0  Injury with Fall? 0 0 0  0  Risk for fall due to : No Fall Risks    Orthopedic patient  Follow up Falls prevention discussed Falls evaluation completed;Education provided Falls evaluation completed;Education provided  Falls evaluation completed;Education provided  Comment  uses cane and walker       MEDICARE RISK AT HOME: Medicare Risk at Home Any stairs in or around the home?: Yes If so, are there any without handrails?: No Home free of loose throw rugs in walkways, pet beds, electrical cords, etc?:  Yes Adequate lighting in your home to reduce  risk of falls?: Yes Life alert?: No Use of a cane, walker or w/c?: Yes Grab bars in the bathroom?: No Shower chair or bench in shower?: Yes Elevated toilet seat or a handicapped toilet?: Yes  TIMED UP AND GO:  Was the test performed?  No    Cognitive Function:        12/26/2022   10:43 AM 12/25/2021   10:42 AM 12/25/2021   10:41 AM  6CIT Screen  What Year? 0 points 0 points 0 points  What month? 0 points 0 points 0 points  What time? 0 points 0 points 0 points  Count back from 20 0 points 0 points 0 points  Months in reverse 0 points 0 points 0 points  Repeat phrase 0 points 0 points 0 points  Total Score 0 points 0 points 0 points    Immunizations Immunization History  Administered Date(s) Administered   Fluad Quad(high Dose 65+) 04/29/2019, 04/25/2020, 06/27/2021   Influenza Split 02/07/2012   Influenza, High Dose Seasonal PF 01/22/2018   Influenza,inj,Quad PF,6+ Mos 03/18/2014, 02/24/2015, 12/26/2015   PFIZER Comirnaty(Gray Top)Covid-19 Tri-Sucrose Vaccine 06/02/2020   Pneumococcal Conjugate-13 03/18/2014   Pneumococcal Polysaccharide-23 11/14/2010, 10/17/2017, 06/27/2021   Tdap 11/14/2010   Zoster, Live 02/14/2016    TDAP status: Due, Education has been provided regarding the importance of this vaccine. Advised may receive this vaccine at local pharmacy or Health Dept. Aware to provide a copy of the vaccination record if obtained from local pharmacy or Health Dept. Verbalized acceptance and understanding.  Flu Vaccine status: Up to date  Pneumococcal vaccine status: Up to date  Covid-19 vaccine status: Completed vaccines  Qualifies for Shingles Vaccine? Yes   Zostavax completed Yes   Shingrix Completed?: No.    Education has been provided regarding the importance of this vaccine. Patient has been advised to call insurance company to determine out of pocket expense if they have not yet received this vaccine. Advised may also receive vaccine at local pharmacy or  Health Dept. Verbalized acceptance and understanding.  Screening Tests Health Maintenance  Topic Date Due   Diabetic kidney evaluation - Urine ACR  Never done   Zoster Vaccines- Shingrix (1 of 2) 05/08/1991   OPHTHALMOLOGY EXAM  02/25/2015   DTaP/Tdap/Td (2 - Td or Tdap) 11/13/2020   COVID-19 Vaccine (2 - 2023-24 season) 01/05/2022   INFLUENZA VACCINE  12/06/2022   HEMOGLOBIN A1C  02/07/2023   FOOT EXAM  04/05/2023   Diabetic kidney evaluation - eGFR measurement  12/19/2023   Medicare Annual Wellness (AWV)  12/26/2023   Pneumonia Vaccine 24+ Years old  Completed   HPV VACCINES  Aged Out   Lung Cancer Screening  Discontinued   Hepatitis C Screening  Discontinued    Health Maintenance  Health Maintenance Due  Topic Date Due   Diabetic kidney evaluation - Urine ACR  Never done   Zoster Vaccines- Shingrix (1 of 2) 05/08/1991   OPHTHALMOLOGY EXAM  02/25/2015   DTaP/Tdap/Td (2 - Td or Tdap) 11/13/2020   COVID-19 Vaccine (2 - 2023-24 season) 01/05/2022   INFLUENZA VACCINE  12/06/2022    Colorectal cancer screening: Type of screening: FOBT/FIT. Completed 06/10/2021. Repeat every 1 years  Lung Cancer Screening: (Low Dose CT Chest recommended if Age 4-80 years, 20 pack-year currently smoking OR have quit w/in 15years.) does not qualify.   Lung Cancer Screening Referral: discontinued  Additional Screening:  Hepatitis C Screening: does qualify; Completed 05/20/2020  Vision  Screening: Recommended annual ophthalmology exams for early detection of glaucoma and other disorders of the eye. Is the patient up to date with their annual eye exam?  No  Who is the provider or what is the name of the office in which the patient attends annual eye exams? Needs a new eye doctor If pt is not established with a provider, would they like to be referred to a provider to establish care? Yes .   Dental Screening: Recommended annual dental exams for proper oral hygiene  Diabetic Foot Exam: Diabetic  Foot Exam: Completed 04/04/2022  Community Resource Referral / Chronic Care Management: CRR required this visit?  No   CCM required this visit?  No     Plan:     I have personally reviewed and noted the following in the patient's chart:   Medical and social history Use of alcohol, tobacco or illicit drugs  Current medications and supplements including opioid prescriptions. Patient is currently taking opioid prescriptions. Information provided to patient regarding non-opioid alternatives. Patient advised to discuss non-opioid treatment plan with their provider. Functional ability and status Nutritional status Physical activity Advanced directives List of other physicians Hospitalizations, surgeries, and ER visits in previous 12 months Vitals Screenings to include cognitive, depression, and falls Referrals and appointments  In addition, I have reviewed and discussed with patient certain preventive protocols, quality metrics, and best practice recommendations. A written personalized care plan for preventive services as well as general preventive health recommendations were provided to patient.     Mickeal Needy, LPN   1/61/0960   After Visit Summary: (Mail) Due to this being a telephonic visit, the after visit summary with patients personalized plan was offered to patient via mail   Nurse Notes: Normal cognitive status assessed by direct observation via telephone conversation by this Nurse Health Advisor. No abnormalities found.

## 2023-01-15 DIAGNOSIS — M47816 Spondylosis without myelopathy or radiculopathy, lumbar region: Secondary | ICD-10-CM | POA: Diagnosis not present

## 2023-01-21 ENCOUNTER — Telehealth: Payer: Self-pay | Admitting: Internal Medicine

## 2023-01-21 NOTE — Telephone Encounter (Signed)
Prescription Request  01/21/2023  LOV: 12/19/2022  What is the name of the medication or equipment? oxyCODONE-acetaminophen (PERCOCET) 10-325 MG tablet   Have you contacted your pharmacy to request a refill? No   Which pharmacy would you like this sent to?  [id number] [place], [place] - [address] [place] [address] Phone: [phone] Fax: [phone]    Patient notified that their request is being sent to the clinical staff for review and that they should receive a response within 2 business days.   Please advise at [facility] [phone]

## 2023-01-22 ENCOUNTER — Other Ambulatory Visit: Payer: Self-pay | Admitting: Internal Medicine

## 2023-01-22 DIAGNOSIS — M5416 Radiculopathy, lumbar region: Secondary | ICD-10-CM

## 2023-01-22 DIAGNOSIS — M159 Polyosteoarthritis, unspecified: Secondary | ICD-10-CM

## 2023-01-22 MED ORDER — OXYCODONE-ACETAMINOPHEN 10-325 MG PO TABS: 1.0000 | ORAL_TABLET | Freq: Four times a day (QID) | ORAL | 0 refills | Status: DC | PRN

## 2023-02-04 ENCOUNTER — Ambulatory Visit: Payer: Medicare Other | Admitting: Internal Medicine

## 2023-02-04 NOTE — Progress Notes (Deleted)
Cardiology Office Note:  .    Date:  02/04/2023  ID:  Adrian Miller, DOB 28-Mar-1941, MRN [id number] PCP: Etta Grandchild, MD  [facility] Providers Cardiologist:  None { Click to update primary MD,subspecialty MD or APP then REFRESH:1}    CC: *** Consulted for the evaluation of CAD/HF/Transition to new cardiology group at the behest of Dr. Yetta Barre   History of Present Illness: .    Adrian Miller is a 82 y.o. male with a history of obstructive CAD, with distal RCA and ramus disease not amenable to intervention, ischemic cardiomyopathy with reported TR (mild to moderate) and AI (Mild) in 2023 who presents to establish care    Relevant histories: .  Social *** ROS: As per HPI.   Studies Reviewed: .   Cardiac Studies & Procedures   CARDIAC CATHETERIZATION  CARDIAC CATHETERIZATION 09/21/2021  Narrative Images from the original result were not included. LM: Normal LAD: Tortuous. Rapid taper distally. No significant disease Lcx: Large, dominant, tortuous. Distal 50% stenosis Ramus: Extreme tortuous takeoff. 95% stenosis RCA: Small, non-dominant. Mid 95% stenosis, best treated medically.  Attempted to wire the vessel but low chance of successful intervention due to extreme tortuosity. Aborted attempt after 5 min. No complication noted. Recommend medical management.   Elder Negus, MD Pager: [phone] Office: [phone]  Findings Coronary Findings Diagnostic  Dominance: Left  Ramus Intermedius Ramus lesion is 85% stenosed.  Right Coronary Artery Prox RCA to Mid RCA lesion is 95% stenosed.  Intervention  No interventions have been documented.     ECHOCARDIOGRAM  ECHOCARDIOGRAM COMPLETE 09/21/2021  Narrative ECHOCARDIOGRAM REPORT    Patient Name:   Adrian Miller Date of Exam: 09/21/2021 Medical Rec #:  [id number]     Height:       69.0 in Accession #:    [id number]    Weight:       180.0 lb Date of Birth:  [birth year]      BSA:          1.976  m Patient Age:    81 years      BP:           143/86 mmHg Patient Gender: M             HR:           56 bpm. Exam Location:  Inpatient  Procedure: 2D Echo, Cardiac Doppler, Color Doppler and Intracardiac Opacification Agent  MODIFIED REPORT: This report was modified by Truett Mainland MD on 09/22/2021 due to Correction. Indications:     122-I22.9 Subsequent ST elevation (STEM) and non-ST elevation (NSTEMI) myocardial infarction  History:         Patient has prior history of Echocardiogram examinations, most recent 06/01/2015. CHF, Acute MI, Abnormal ECG, Arrythmias:Bradycardia; Risk Factors:Hypertension and Current Smoker.  Sonographer:     Sheralyn Boatman RDCS Referring Phys:  [id number] Angie Fava Diagnosing Phys: Truett Mainland MD   Sonographer Comments: Technically difficult study due to poor echo windows, suboptimal parasternal window and suboptimal apical window. Image acquisition challenging due to patient body habitus. Attempted to turn, very low parasternal. IMPRESSIONS   1. Left ventricular ejection fraction, by estimation, is 35 to 40%. The left ventricle has normal function. The left ventricle demonstrates regional wall motion abnormalities (see scoring diagram/findings for description). Left ventricular diastolic parameters are consistent with Grade I diastolic dysfunction (impaired relaxation). There is moderate hypokinesis of the left ventricular, mid-apical anteroseptal wall, inferolateral wall and apical segment.  2. Right ventricular systolic function is normal. The right ventricular size is normal. There is mildly elevated pulmonary artery systolic pressure 40 mmHg. 3. The mitral valve is normal in structure. No evidence of mitral valve regurgitation. No evidence of mitral stenosis. 4. Tricuspid valve regurgitation is mild to moderate. 5. The aortic valve is calcified. Aortic valve regurgitation is mild. No aortic stenosis is present.  FINDINGS Left Ventricle:  Left ventricular ejection fraction, by estimation, is 35 to 40%. The left ventricle has normal function. The left ventricle demonstrates regional wall motion abnormalities. Moderate hypokinesis of the left ventricular, mid-apical anteroseptal wall, inferolateral wall and apical segment. Definity contrast agent was given IV to delineate the left ventricular endocardial borders. The left ventricular internal cavity size was normal in size. There is no left ventricular hypertrophy. Left ventricular diastolic parameters are consistent with Grade I diastolic dysfunction (impaired relaxation). Normal left ventricular filling pressure.  Right Ventricle: The right ventricular size is normal. No increase in right ventricular wall thickness. Right ventricular systolic function is normal. There is mildly elevated pulmonary artery systolic pressure. The tricuspid regurgitant velocity is 3.03 m/s, and with an assumed right atrial pressure of 3 mmHg, the estimated right ventricular systolic pressure is 39.7 mmHg.  Left Atrium: Left atrial size was normal in size.  Right Atrium: Right atrial size was normal in size.  Pericardium: There is no evidence of pericardial effusion.  Mitral Valve: The mitral valve is normal in structure. No evidence of mitral valve regurgitation. No evidence of mitral valve stenosis.  Tricuspid Valve: The tricuspid valve is normal in structure. Tricuspid valve regurgitation is mild to moderate.  Aortic Valve: The aortic valve is calcified. Aortic valve regurgitation is mild. No aortic stenosis is present.  Pulmonic Valve: The pulmonic valve was normal in structure. Pulmonic valve regurgitation is not visualized. No evidence of pulmonic stenosis.  Aorta: The aortic root is normal in size and structure.  IAS/Shunts: The interatrial septum was not assessed.   LEFT VENTRICLE PLAX 2D LVIDd:         4.80 cm      Diastology LVIDs:         3.60 cm      LV e' medial:    5.98 cm/s LV  PW:         1.00 cm      LV E/e' medial:  11.3 LV IVS:        1.00 cm      LV e' lateral:   6.31 cm/s LVOT diam:     2.60 cm      LV E/e' lateral: 10.7 LV SV:         83 LV SV Index:   42 LVOT Area:     5.31 cm  LV Volumes (MOD) LV vol d, MOD A2C: 99.4 ml LV vol d, MOD A4C: 114.0 ml LV vol s, MOD A2C: 61.8 ml LV vol s, MOD A4C: 68.5 ml LV SV MOD A2C:     37.6 ml LV SV MOD A4C:     114.0 ml LV SV MOD BP:      45.8 ml  RIGHT VENTRICLE             IVC RV S prime:     14.70 cm/s  IVC diam: 1.90 cm TAPSE (M-mode): 2.4 cm  LEFT ATRIUM             Index        RIGHT ATRIUM  Index LA diam:        3.40 cm 1.72 cm/m   RA Area:     13.50 cm LA Vol (A2C):   33.0 ml 16.70 ml/m  RA Volume:   32.70 ml  16.55 ml/m LA Vol (A4C):   26.4 ml 13.36 ml/m LA Biplane Vol: 32.2 ml 16.30 ml/m AORTIC VALVE LVOT Vmax:   79.60 cm/s LVOT Vmean:  49.900 cm/s LVOT VTI:    0.157 m  AORTA Ao Root diam: 3.40 cm  MITRAL VALVE               TRICUSPID VALVE MV Area (PHT): 4.17 cm    TR Peak grad:   36.7 mmHg MV Decel Time: 182 msec    TR Vmax:        303.00 cm/s MV E velocity: 67.70 cm/s MV A velocity: 85.70 cm/s  SHUNTS MV E/A ratio:  0.79        Systemic VTI:  0.16 m Systemic Diam: 2.60 cm  Truett Mainland MD Electronically signed by Truett Mainland MD Signature Date/Time: 09/21/2021/11:53:08 AM    Final (Updated)             Risk Assessment/Calculations:    {Does this patient have ATRIAL FIBRILLATION?:[phone]}   {This patient may be at risk for Amyloid.  Click HERE to open Cardiac Amyloid Screening SmartSet to order screening OR Click HERE to defer testing for 1 year or permanently :1}    Physical Exam:    VS:  There were no vitals taken for this visit.   Wt Readings from Last 3 Encounters:  12/26/22 184 lb (83.5 kg)  12/20/22 184 lb (83.5 kg)  12/19/22 182 lb (82.6 kg)    Gen: *** distress, *** obese/well nourished/malnourished   Neck: No JVD, *** carotid  bruit Ears: *** Frank Sign Cardiac: No Rubs or Gallops, *** Murmur, ***cardia, *** radial pulses Respiratory: Clear to auscultation bilaterally, *** effort, ***  respiratory rate GI: Soft, nontender, non-distended *** MS: No *** edema; *** moves all extremities Integument: Skin feels *** Neuro:  At time of evaluation, alert and oriented to person/place/time/situation *** Psych: Normal affect, patient feels ***   ASSESSMENT AND PLAN: .    ***   Riley Lam, MD FASE [facility]  [address], [address] [place], [place] [address] [phone]  7:52 AM

## 2023-02-06 ENCOUNTER — Ambulatory Visit: Payer: Medicare Other | Admitting: Internal Medicine

## 2023-02-19 ENCOUNTER — Telehealth: Payer: Self-pay | Admitting: Internal Medicine

## 2023-02-19 NOTE — Telephone Encounter (Signed)
Prescription Request  02/19/2023  LOV: 12/19/2022  What is the name of the medication or equipment? oxyCODONE-acetaminophen (PERCOCET) 10-325 MG tablet   Have you contacted your pharmacy to request a refill? No   Which pharmacy would you like this sent to?  [id number] [place], [place] - [address] [place] [address] Phone: [phone] Fax: [phone]    Patient notified that their request is being sent to the clinical staff for review and that they should receive a response within 2 business days.   Please advise at [facility] [phone]

## 2023-02-20 ENCOUNTER — Other Ambulatory Visit: Payer: Self-pay | Admitting: Internal Medicine

## 2023-02-20 DIAGNOSIS — M5416 Radiculopathy, lumbar region: Secondary | ICD-10-CM

## 2023-02-20 DIAGNOSIS — M159 Polyosteoarthritis, unspecified: Secondary | ICD-10-CM

## 2023-02-20 MED ORDER — OXYCODONE-ACETAMINOPHEN 10-325 MG PO TABS: 1.0000 | ORAL_TABLET | Freq: Four times a day (QID) | ORAL | 0 refills | Status: DC | PRN

## 2023-02-27 DIAGNOSIS — H348312 Tributary (branch) retinal vein occlusion, right eye, stable: Secondary | ICD-10-CM | POA: Diagnosis not present

## 2023-03-15 DIAGNOSIS — H34831 Tributary (branch) retinal vein occlusion, right eye, with macular edema: Secondary | ICD-10-CM | POA: Diagnosis not present

## 2023-03-19 ENCOUNTER — Other Ambulatory Visit: Payer: Self-pay | Admitting: Internal Medicine

## 2023-03-21 ENCOUNTER — Other Ambulatory Visit: Payer: Self-pay

## 2023-03-21 ENCOUNTER — Telehealth: Payer: Self-pay | Admitting: Internal Medicine

## 2023-03-21 DIAGNOSIS — M159 Polyosteoarthritis, unspecified: Secondary | ICD-10-CM

## 2023-03-21 DIAGNOSIS — M5416 Radiculopathy, lumbar region: Secondary | ICD-10-CM

## 2023-03-21 MED ORDER — OXYCODONE-ACETAMINOPHEN 10-325 MG PO TABS
1.0000 | ORAL_TABLET | Freq: Four times a day (QID) | ORAL | 0 refills | Status: DC | PRN
Start: 2023-03-21 — End: 2023-04-19

## 2023-03-21 NOTE — Telephone Encounter (Signed)
Prescription Request  03/21/2023  LOV: 12/19/2022  What is the name of the medication or equipment?  oxyCODONE-acetaminophen (PERCOCET) 10-325 MG tablet   Which pharmacy would you like this sent to?     [id number] [place],  - [address] [place] [address] Phone: [phone] Fax: [phone]  Patient notified that their request is being sent to the clinical staff for review and that they should receive a response within 2 business days.   Please advise at Mobile [phone] (mobile)

## 2023-03-21 NOTE — Telephone Encounter (Signed)
 Medication refill sent to Dr. Yetta Barre

## 2023-03-30 ENCOUNTER — Emergency Department (HOSPITAL_COMMUNITY): Payer: Medicare Other

## 2023-03-30 ENCOUNTER — Emergency Department (HOSPITAL_COMMUNITY)
Admission: EM | Admit: 2023-03-30 | Discharge: 2023-03-30 | Disposition: A | Discharge status: Home / Self Care | Payer: Medicare Other | Attending: Emergency Medicine | Admitting: Emergency Medicine

## 2023-03-30 DIAGNOSIS — I509 Heart failure, unspecified: Secondary | ICD-10-CM | POA: Insufficient documentation

## 2023-03-30 DIAGNOSIS — I110 Hypertensive heart disease with heart failure: Secondary | ICD-10-CM | POA: Insufficient documentation

## 2023-03-30 DIAGNOSIS — Z7902 Long term (current) use of antithrombotics/antiplatelets: Secondary | ICD-10-CM | POA: Insufficient documentation

## 2023-03-30 DIAGNOSIS — Z79899 Other long term (current) drug therapy: Secondary | ICD-10-CM | POA: Diagnosis not present

## 2023-03-30 DIAGNOSIS — R0602 Shortness of breath: Secondary | ICD-10-CM | POA: Diagnosis not present

## 2023-03-30 DIAGNOSIS — Z1152 Encounter for screening for COVID-19: Secondary | ICD-10-CM | POA: Insufficient documentation

## 2023-03-30 DIAGNOSIS — J40 Bronchitis, not specified as acute or chronic: Secondary | ICD-10-CM | POA: Diagnosis not present

## 2023-03-30 DIAGNOSIS — Z7982 Long term (current) use of aspirin: Secondary | ICD-10-CM | POA: Insufficient documentation

## 2023-03-30 DIAGNOSIS — Z471 Aftercare following joint replacement surgery: Secondary | ICD-10-CM | POA: Diagnosis not present

## 2023-03-30 DIAGNOSIS — Z96611 Presence of right artificial shoulder joint: Secondary | ICD-10-CM | POA: Diagnosis not present

## 2023-03-30 LAB — CBC WITH DIFFERENTIAL/PLATELET
Abs Immature Granulocytes: 0.02 10*3/uL (ref 0.00–0.07)
Basophils Absolute: 0 10*3/uL (ref 0.0–0.1)
Basophils Relative: 0 %
Eosinophils Absolute: 0.3 10*3/uL (ref 0.0–0.5)
Eosinophils Relative: 3 %
HCT: 40 % (ref 39.0–52.0)
Hemoglobin: 13.1 g/dL (ref 13.0–17.0)
Immature Granulocytes: 0 %
Lymphocytes Relative: 21 %
Lymphs Abs: 1.7 10*3/uL (ref 0.7–4.0)
MCH: 30.5 pg (ref 26.0–34.0)
MCHC: 32.8 g/dL (ref 30.0–36.0)
MCV: 93 fL (ref 80.0–100.0)
Monocytes Absolute: 0.6 10*3/uL (ref 0.1–1.0)
Monocytes Relative: 8 %
Neutro Abs: 5.5 10*3/uL (ref 1.7–7.7)
Neutrophils Relative %: 68 %
Platelets: 327 10*3/uL (ref 150–400)
RBC: 4.3 MIL/uL (ref 4.22–5.81)
RDW: 16.2 % — ABNORMAL HIGH (ref 11.5–15.5)
WBC: 8.2 10*3/uL (ref 4.0–10.5)
nRBC: 0 % (ref 0.0–0.2)

## 2023-03-30 LAB — BASIC METABOLIC PANEL
Anion gap: 7 (ref 5–15)
BUN: 17 mg/dL (ref 8–23)
CO2: 24 mmol/L (ref 22–32)
Calcium: 8.7 mg/dL — ABNORMAL LOW (ref 8.9–10.3)
Chloride: 106 mmol/L (ref 98–111)
Creatinine, Ser: 1.13 mg/dL (ref 0.61–1.24)
GFR, Estimated: >60 mL/min (ref >60)
Glucose, Bld: 87 mg/dL (ref 70–99)
Potassium: 4.4 mmol/L (ref 3.5–5.1)
Sodium: 137 mmol/L (ref 135–145)

## 2023-03-30 LAB — BRAIN NATRIURETIC PEPTIDE: B Natriuretic Peptide: 37.7 pg/mL (ref 0.0–100.0)

## 2023-03-30 LAB — TROPONIN I (HIGH SENSITIVITY): Troponin I (High Sensitivity): 10 ng/L (ref <18)

## 2023-03-30 LAB — SARS CORONAVIRUS 2 BY RT PCR: SARS Coronavirus 2 by RT PCR: NEGATIVE

## 2023-03-30 MED ORDER — ALBUTEROL SULFATE HFA 108 (90 BASE) MCG/ACT IN AERS: 2.0000 | INHALATION_SPRAY | RESPIRATORY_TRACT | 0 refills | Status: DC | PRN

## 2023-03-30 MED ORDER — IPRATROPIUM-ALBUTEROL 0.5-2.5 (3) MG/3ML IN SOLN
3.0000 mL | Freq: Once | RESPIRATORY_TRACT | Status: AC
  Administered 2023-03-30: 3 mL via RESPIRATORY_TRACT
  Filled 2023-03-30: qty 3

## 2023-03-30 MED ORDER — PREDNISONE 20 MG PO TABS
60.0000 mg | ORAL_TABLET | Freq: Once | ORAL | Status: AC
  Administered 2023-03-30: 60 mg via ORAL
  Filled 2023-03-30: qty 3

## 2023-03-30 MED ORDER — IPRATROPIUM-ALBUTEROL 0.5-2.5 (3) MG/3ML IN SOLN
RESPIRATORY_TRACT | Status: AC
  Filled 2023-03-30: qty 3

## 2023-03-30 MED ORDER — PREDNISONE 10 MG PO TABS: 40.0000 mg | ORAL_TABLET | Freq: Every day | ORAL | 0 refills | Status: AC

## 2023-03-30 NOTE — ED Notes (Signed)
Second trop sent already

## 2023-03-30 NOTE — Discharge Instructions (Signed)
Were seen in the emergency department for your cough and your shortness of breath.  Your workup showed no signs of pneumonia but you did have some wheezing and likely have bronchitis.  You tested negative for COVID but could have another viral infection causing your symptoms or it could be related to your smoking.  You should try to cut back on smoking.  Given you a short course of steroids as well has an inhaler that you can use as needed for your shortness of breath.  You should return to the emergency department for fevers, significantly worsening shortness of breath, severe chest pain or any other new or concerning symptoms.

## 2023-03-30 NOTE — ED Provider Notes (Signed)
[place] EMERGENCY DEPARTMENT AT [facility] Provider Note   CSN: [id number] Arrival date & time: 03/30/23  0846     History  Chief Complaint  Patient presents with   Shortness of Breath    Adrian Miller is a 82 y.o. male.  Patient is an 82 year old male with a past medical history of hypertension, CHF, tobacco use presenting to the emergency department with shortness of breath.  Patient states that he has had shortness of breath for the last 1 to 2 weeks.  He states that he feels short of breath all the time but is worse at night and on ambulation.  He states he has been sleeping on multiple pillows.  States that he does have a cough productive of mucus.  He denies any fevers or chest pain.  He denies any prior history of VTE, recent hospitalization or surgery or recent long travel in the car or plane.  He denies any lower extremity swelling.  The history is provided by the patient.  Shortness of Breath      Home Medications Prior to Admission medications   Medication Sig Start Date End Date Taking? Authorizing Provider  albuterol (VENTOLIN HFA) 108 (90 Base) MCG/ACT inhaler Inhale 2 puffs into the lungs every 4 (four) hours as needed for wheezing or shortness of breath. 03/30/23  Yes Theresia Lo, Turkey K, DO  predniSONE (DELTASONE) 10 MG tablet Take 4 tablets (40 mg total) by mouth daily for 4 days. 03/30/23 04/03/23 Yes Elayne Snare K, DO  aspirin EC 81 MG tablet Take 1 tablet (81 mg total) by mouth daily. 09/22/21   Patwardhan, Anabel Bene, MD  atorvastatin (LIPITOR) 40 MG tablet Take 1 tablet (40 mg total) by mouth daily. 12/21/22   Etta Grandchild, MD  clopidogrel (PLAVIX) 75 MG tablet Take 1 tablet (75 mg total) by mouth daily. 09/23/21   Patwardhan, Anabel Bene, MD  Ferric Maltol (ACCRUFER) 30 MG CAPS Take 1 capsule (30 mg total) by mouth in the morning and at bedtime. 12/19/22   Etta Grandchild, MD  KLOR-CON M20 20 MEQ tablet TAKE 1 TABLET BY MOUTH TWICE A DAY  06/22/22   Etta Grandchild, MD  nitroGLYCERIN (NITROSTAT) 0.4 MG SL tablet Place 1 tablet (0.4 mg total) under the tongue every 5 (five) minutes as needed for chest pain. 09/22/21   Patwardhan, Anabel Bene, MD  oxyCODONE-acetaminophen (PERCOCET) 10-325 MG tablet Take 1 tablet by mouth every 6 (six) hours as needed for pain. 03/21/23   Etta Grandchild, MD  pantoprazole (PROTONIX) 40 MG tablet TAKE 1 TABLET BY MOUTH EVERY DAY Patient taking differently: 40 mg every morning. 05/22/21   Meryl Dare, MD  sacubitril-valsartan (ENTRESTO) 24-26 MG Take 1 tablet by mouth 2 (two) times daily. 12/21/22   Etta Grandchild, MD      Allergies    Asa [aspirin] and [name] [aspirin-salicylamide-caffeine]    Review of Systems   Review of Systems  Respiratory:  Positive for shortness of breath.     Physical Exam Updated Vital Signs BP (!) 156/112   Pulse (!) 56   Temp 97.8 F (36.6 C) (Oral)   Resp 16   Ht 5\' 9"  (1.753 m)   Wt 63.5 kg   SpO2 100%   BMI 20.67 kg/m  Physical Exam Vitals and nursing note reviewed.  Constitutional:      General: He is not in acute distress.    Appearance: He is well-developed.  HENT:  Head: Normocephalic and atraumatic.     Mouth/Throat:     Mouth: Mucous membranes are moist.  Eyes:     Extraocular Movements: Extraocular movements intact.  Neck:     Vascular: No JVD.  Cardiovascular:     Rate and Rhythm: Normal rate and regular rhythm.     Heart sounds: Normal heart sounds.  Pulmonary:     Effort: No accessory muscle usage.     Comments: Mild conversational dyspnea, bronchospastic cough Abdominal:     Palpations: Abdomen is soft.     Tenderness: There is no abdominal tenderness.  Musculoskeletal:        General: Normal range of motion.     Cervical back: Normal range of motion and neck supple.     Right lower leg: No edema.     Left lower leg: No edema.  Skin:    General: Skin is warm and dry.  Neurological:     General: No focal  deficit present.     Mental Status: He is alert and oriented to person, place, and time.  Psychiatric:        Mood and Affect: Mood normal.        Behavior: Behavior normal.     ED Results / Procedures / Treatments   Labs (all labs ordered are listed, but only abnormal results are displayed) Labs Reviewed  CBC WITH DIFFERENTIAL/PLATELET - Abnormal; Notable for the following components:      Result Value   RDW 16.2 (*)    All other components within normal limits  BASIC METABOLIC PANEL - Abnormal; Notable for the following components:   Calcium 8.7 (*)    All other components within normal limits  SARS CORONAVIRUS 2 BY RT PCR  BRAIN NATRIURETIC PEPTIDE  TROPONIN I (HIGH SENSITIVITY)    EKG None  Radiology DG Chest 2 View  Result Date: 03/30/2023 CLINICAL DATA:  Shortness of breath. EXAM: CHEST - 2 VIEW COMPARISON:  12/19/2022 FINDINGS: The lungs are clear without focal pneumonia, edema, pneumothorax or pleural effusion. The cardiopericardial silhouette is within normal limits for size. No acute bony abnormality. Status post right shoulder replacement. Degenerative changes noted left shoulder. Telemetry leads overlie the chest. IMPRESSION: No active cardiopulmonary disease. Electronically Signed   By: Kennith Center M.D.   On: 03/30/2023 10:10    Procedures Procedures    Medications Ordered in ED Medications  ipratropium-albuterol (DUONEB) 0.5-2.5 (3) MG/3ML nebulizer solution (  Not Given 03/30/23 1213)  ipratropium-albuterol (DUONEB) 0.5-2.5 (3) MG/3ML nebulizer solution 3 mL (3 mLs Nebulization Given 03/30/23 1008)  predniSONE (DELTASONE) tablet 60 mg (60 mg Oral Given 03/30/23 1008)    ED Course/ Medical Decision Making/ A&P Clinical Course as of 03/30/23 1246  Sat Mar 30, 2023  1146 Initial troponin negative, symptoms ongoing for several days a single troponin is sufficient.  BNP is negative without signs of pulmonary edema on chest x-ray.  COVID swab was negative,  chest x-ray is without acute disease.  Patient likely with bronchitis. [VK]  1237 Shortness of breath improved with treatments.  Patient's lungs are clear to auscultation.  He is stable for discharge home with outpatient follow-up. [VK]    Clinical Course User Index [VK] Rexford Maus, DO                                 Medical Decision Making This patient presents to the ED with chief complaint(s) of  shortness of breath with pertinent past medical history of hypertension, CHF, tobacco use which further complicates the presenting complaint. The complaint involves an extensive differential diagnosis and also carries with it a high risk of complications and morbidity.    The differential diagnosis includes atypical ACS, arrhythmia, anemia, pneumonia, pneumothorax, pulmonary edema, pleural effusion, bronchitis, viral syndrome  Additional history obtained: Additional history obtained from N/A Records reviewed Primary Care Documents  ED Course and Reassessment: On patient's arrival he is hypertensive but otherwise hemodynamically stable in no acute distress, satting well on room air.  Does have evidence of a bronchospastic cough and will be given a DuoNeb and steroids for possible bronchitis or undiagnosed COPD with his smoking history.  Will additionally have labs, EKG and chest x-ray with associated orthopnea and history of CHF and will be closely reassessed.  Independent labs interpretation:  The following labs were independently interpreted: within normal range  Independent visualization of imaging: - I independently visualized the following imaging with scope of interpretation limited to determining acute life threatening conditions related to emergency care: CXR, which revealed no acute disease  Consultation: - Consulted or discussed management/test interpretation w/ external professional: N/A  Consideration for admission or further workup: Patient has no emergent conditions  requiring admission or further work-up at this time and is stable for discharge home with primary care follow-up  Social Determinants of health: N/A    Amount and/or Complexity of Data Reviewed Labs: ordered. Radiology: ordered.  Risk Prescription drug management.          Final Clinical Impression(s) / ED Diagnoses Final diagnoses:  Bronchitis    Rx / DC Orders ED Discharge Orders          Ordered    predniSONE (DELTASONE) 10 MG tablet  Daily        03/30/23 1244    albuterol (VENTOLIN HFA) 108 (90 Base) MCG/ACT inhaler  Every 4 hours PRN        03/30/23 1244              Rexford Maus, DO 03/30/23 1246

## 2023-03-30 NOTE — ED Triage Notes (Addendum)
Patient c/o shobr x 2 weeks, got worse 1 week ago  Denies any trigger events or respiratory history  Chest and back hurts "a little bit" Denies recent illness  Patient is smoker, 10 cigarettes this week

## 2023-04-19 ENCOUNTER — Telehealth: Payer: Self-pay | Admitting: Internal Medicine

## 2023-04-19 ENCOUNTER — Other Ambulatory Visit: Payer: Self-pay

## 2023-04-19 DIAGNOSIS — M159 Polyosteoarthritis, unspecified: Secondary | ICD-10-CM

## 2023-04-19 DIAGNOSIS — M5416 Radiculopathy, lumbar region: Secondary | ICD-10-CM

## 2023-04-19 MED ORDER — OXYCODONE-ACETAMINOPHEN 10-325 MG PO TABS: 1.0000 | ORAL_TABLET | Freq: Four times a day (QID) | ORAL | 0 refills | Status: DC | PRN

## 2023-04-19 NOTE — Telephone Encounter (Signed)
Prescription Request  04/19/2023  LOV: 12/19/2022  What is the name of the medication or equipment? oxyCODONE-acetaminophen (PERCOCET) 10-325 MG tablet   Have you contacted your pharmacy to request a refill? No   Which pharmacy would you like this sent to?    [id number] [place], [place] - [address] [place] [address] Phone: [phone] Fax: [phone]  Patient notified that their request is being sent to the clinical staff for review and that they should receive a response within 2 business days.   Please advise at Mobile [phone] (mobile)

## 2023-04-19 NOTE — Telephone Encounter (Signed)
 Medication refill sent to Dr. Yetta Barre

## 2023-05-13 DIAGNOSIS — M47816 Spondylosis without myelopathy or radiculopathy, lumbar region: Secondary | ICD-10-CM | POA: Diagnosis not present

## 2023-05-21 ENCOUNTER — Other Ambulatory Visit: Payer: Self-pay | Admitting: Internal Medicine

## 2023-05-21 DIAGNOSIS — M159 Polyosteoarthritis, unspecified: Secondary | ICD-10-CM

## 2023-05-21 DIAGNOSIS — M5416 Radiculopathy, lumbar region: Secondary | ICD-10-CM

## 2023-05-21 DIAGNOSIS — M47816 Spondylosis without myelopathy or radiculopathy, lumbar region: Secondary | ICD-10-CM | POA: Diagnosis not present

## 2023-05-21 NOTE — Telephone Encounter (Signed)
 Copied from CRM [id number]. Topic: Clinical - Medication Refill >> May 21, 2023  9:54 AM Corean SAUNDERS wrote: Most Recent Primary Care Visit:  Provider: TOMIE ROZ SAILOR  Department: [facility]  Visit Type: MEDICARE AWV, SEQUENTIAL  Date: 12/26/2022 oxyCODONE -acetaminophen  (PERCOCET) 10-325 MG tablet  Medication:  Has the patient contacted their pharmacy? Out of re-fills (Agent: If no, request that the patient contact the pharmacy for the refill. If patient does not wish to contact the pharmacy document the reason why and proceed with request.) (Agent: If yes, when and what did the pharmacy advise?)  Is this the correct pharmacy for this prescription? Yes If no, delete pharmacy and type the correct one.  This is the patient's preferred pharmacy:   [id number] [place], [place] - [address] [place] [address] Phone: [phone] Fax: [phone]    Has the prescription been filled recently? Yes  Is the patient out of the medication? Yes  Has the patient been seen for an appointment in the last year OR does the patient have an upcoming appointment? Yes  Can we respond through MyChart? No  Agent: Please be advised that Rx refills may take up to 3 business days. We ask that you follow-up with your pharmacy.

## 2023-05-22 MED ORDER — OXYCODONE-ACETAMINOPHEN 10-325 MG PO TABS: 1.0000 | ORAL_TABLET | Freq: Four times a day (QID) | ORAL | 0 refills | Status: DC | PRN

## 2023-06-21 ENCOUNTER — Other Ambulatory Visit: Payer: Self-pay | Admitting: Internal Medicine

## 2023-06-21 DIAGNOSIS — M5416 Radiculopathy, lumbar region: Secondary | ICD-10-CM

## 2023-06-21 DIAGNOSIS — M159 Polyosteoarthritis, unspecified: Secondary | ICD-10-CM

## 2023-06-21 NOTE — Telephone Encounter (Signed)
Copied from CRM [id number]. Topic: Clinical - Medication Refill >> Jun 21, 2023  8:35 AM Drema Balzarine wrote: Most Recent Primary Care Visit:  Provider: Mickeal Needy  Department: [facility]  Visit Type: MEDICARE AWV, SEQUENTIAL  Date: 12/26/2022  Medication: oxyCODONE-acetaminophen (PERCOCET) 10-325 MG tablet  Has the patient contacted their pharmacy? Yes (Agent: If no, request that the patient contact the pharmacy for the refill. If patient does not wish to contact the pharmacy document the reason why and proceed with request.) (Agent: If yes, when and what did the pharmacy advise?)  Is this the correct pharmacy for this prescription? Yes If no, delete pharmacy and type the correct one.  This is the patient's preferred pharmacy:   [id number] [place], [place] - [address] [place] [address] Phone: [phone] Fax: [phone]   Has the prescription been filled recently? Yes  Is the patient out of the medication? Yes  Has the patient been seen for an appointment in the last year OR does the patient have an upcoming appointment? Yes  Can we respond through MyChart? No  Agent: Please be advised that Rx refills may take up to 3 business days. We ask that you follow-up with your pharmacy.

## 2023-06-24 ENCOUNTER — Other Ambulatory Visit: Payer: Self-pay | Admitting: Internal Medicine

## 2023-06-25 ENCOUNTER — Other Ambulatory Visit: Payer: Self-pay | Admitting: Internal Medicine

## 2023-06-25 DIAGNOSIS — E785 Hyperlipidemia, unspecified: Secondary | ICD-10-CM

## 2023-06-26 ENCOUNTER — Ambulatory Visit: Payer: Self-pay | Admitting: Internal Medicine

## 2023-06-26 ENCOUNTER — Ambulatory Visit: Payer: Medicare Other | Admitting: Internal Medicine

## 2023-06-26 NOTE — Telephone Encounter (Signed)
  Chief Complaint: Back Pain/needing pain medication refilled.  Symptoms: back pain Frequency: increased since receiving pain shots in back Disposition: [] ED /[] Urgent Care (no appt availability in office) / [x] Appointment(In office/virtual)/ []  [facility]/ [] Home Care/ [] Refused Recommended Disposition /[] [facility]/ []  Follow-up with PCP Additional Notes: patient called initially questioning if his prescription for Oxycodone was sent to his pharmacy. Upon review, medication refill was denied due to needing an appointment. Patient called and updated. Patient describes pain shot he received for his back not helping and needing his pain medication filled. Appointment made for 07/01/2023 at 10:40 am to see PCP. Patient is asking for pain medication to be refilled and to call him when it has been sent. Patient verbalized understanding and all questions answered.    Copied from CRM [id number]. Topic: Clinical - Prescription Issue >> Jun 26, 2023  9:30 AM Florestine Avers wrote: Reason for CRM: Patient wanted to know if his prescription for  oxyCODONE-acetaminophen (PERCOCET) 10-325 MG tablet was sent over to the pharmacy. I saw a CRM done on 2/14. Patient would like a update on the medication. Reason for Disposition  Back pain present > 2 weeks  Answer Assessment - Initial Assessment Questions 1. ONSET: "When did the pain begin?"      Chronic back pain 2. LOCATION: "Where does it hurt?" (upper, mid or lower back)     Lower back pain 3. SEVERITY: "How bad is the pain?"  (e.g., Scale 1-10; mild, moderate, or severe)   - MILD (1-3): Doesn't interfere with normal activities.    - MODERATE (4-7): Interferes with normal activities or awakens from sleep.    - SEVERE (8-10): Excruciating pain, unable to do any normal activities.      Severe 4. PATTERN: "Is the pain constant?" (e.g., yes, no; constant, intermittent)      Constant 5. RADIATION: "Does the pain shoot into your legs or  somewhere else?"     No 6. CAUSE:  "What do you think is causing the back pain?"      Chronic back pain 7. BACK OVERUSE:  "Any recent lifting of heavy objects, strenuous work or exercise?"     No 8. MEDICINES: "What have you taken so far for the pain?" (e.g., nothing, acetaminophen, NSAIDS)     Has been taking pain medication-is out and needing to be refilled. Patient states he got a shot in his back a few weeks ago and believes shot isn't working 9. NEUROLOGIC SYMPTOMS: "Do you have any weakness, numbness, or problems with bowel/bladder control?"     No 10. OTHER SYMPTOMS: "Do you have any other symptoms?" (e.g., fever, abdomen pain, burning with urination, blood in urine)       No  Protocols used: Back Pain-A-AH

## 2023-07-01 ENCOUNTER — Ambulatory Visit (INDEPENDENT_AMBULATORY_CARE_PROVIDER_SITE_OTHER): Payer: Medicare Other | Admitting: Internal Medicine

## 2023-07-01 ENCOUNTER — Encounter: Payer: Self-pay | Admitting: Internal Medicine

## 2023-07-01 VITALS — BP 146/86 | HR 62 | Temp 97.8°F | Resp 16 | Ht 69.0 in | Wt 198.8 lb

## 2023-07-01 DIAGNOSIS — I10 Essential (primary) hypertension: Secondary | ICD-10-CM | POA: Diagnosis not present

## 2023-07-01 DIAGNOSIS — D510 Vitamin B12 deficiency anemia due to intrinsic factor deficiency: Secondary | ICD-10-CM | POA: Diagnosis not present

## 2023-07-01 DIAGNOSIS — I5032 Chronic diastolic (congestive) heart failure: Secondary | ICD-10-CM

## 2023-07-01 DIAGNOSIS — M159 Polyosteoarthritis, unspecified: Secondary | ICD-10-CM

## 2023-07-01 DIAGNOSIS — Z23 Encounter for immunization: Secondary | ICD-10-CM | POA: Insufficient documentation

## 2023-07-01 DIAGNOSIS — M5416 Radiculopathy, lumbar region: Secondary | ICD-10-CM

## 2023-07-01 MED ORDER — OXYCODONE-ACETAMINOPHEN 10-325 MG PO TABS: 1.0000 | ORAL_TABLET | Freq: Four times a day (QID) | ORAL | 0 refills | Status: DC | PRN

## 2023-07-01 MED ORDER — CYANOCOBALAMIN 1000 MCG/ML IJ SOLN
1000.0000 ug | Freq: Once | INTRAMUSCULAR | Status: AC
  Administered 2023-07-01: 1000 ug via INTRAMUSCULAR

## 2023-07-01 NOTE — Patient Instructions (Signed)

## 2023-07-01 NOTE — Progress Notes (Signed)
 Subjective:  Patient ID: Adrian Miller, male    DOB: [birth year]  Age: 83 y.o. MRN: [id number]  CC: Back Pain and Hypertension   HPI JAMERIUS BOECKMAN presents for f/up -----  Discussed the use of AI scribe software for clinical note transcription with the patient, who gave verbal consent to proceed.  History of Present Illness   Adrian Miller is an 83 year old male who presents with generalized pain and shortness of breath.  He experiences generalized pain, describing it as 'hurting all over.' He received an injection in his back from another physician approximately two weeks ago and is seeking pain medication refills, indicating it is 'past time' for a refill.  He has shortness of breath, which he noticed after being discharged from the hospital. He has been hospitalized two or three times, with the most recent hospitalization occurring in November. No chest pain, hemoptysis, or wheezing. He is unsure about wheezing, stating 'not what he said.'  He has a history of knee replacements, stating 'I got two, you know,' and describes occasional pain above his knee and shoulder, which he alleviates by rubbing to 'get some blood circulating.'  He expresses interest in receiving a flu shot and inquires about a B12 injection, which he recalls was previously given to him for energy.       Outpatient Medications Prior to Visit  Medication Sig Dispense Refill   albuterol (VENTOLIN HFA) 108 (90 Base) MCG/ACT inhaler Inhale 2 puffs into the lungs every 4 (four) hours as needed for wheezing or shortness of breath. 1 each 0   atorvastatin (LIPITOR) 40 MG tablet TAKE 1 TABLET BY MOUTH EVERY DAY 90 tablet 1   clopidogrel (PLAVIX) 75 MG tablet Take 1 tablet (75 mg total) by mouth daily. 30 tablet 2   KLOR-CON M20 20 MEQ tablet TAKE 1 TABLET BY MOUTH TWICE A DAY 180 tablet 0   metoprolol succinate (TOPROL-XL) 25 MG 24 hr tablet Take 25 mg by mouth daily.     pantoprazole (PROTONIX) 40 MG tablet TAKE 1 TABLET  BY MOUTH EVERY DAY (Patient taking differently: 40 mg every morning.) 90 tablet 0   oxyCODONE-acetaminophen (PERCOCET) 10-325 MG tablet Take 1 tablet by mouth every 6 (six) hours as needed for pain. 100 tablet 0   Ferric Maltol (ACCRUFER) 30 MG CAPS Take 1 capsule (30 mg total) by mouth in the morning and at bedtime. (Patient not taking: Reported on 07/01/2023) 180 capsule 1   nitroGLYCERIN (NITROSTAT) 0.4 MG SL tablet Place 1 tablet (0.4 mg total) under the tongue every 5 (five) minutes as needed for chest pain. (Patient not taking: Reported on 07/01/2023) 30 tablet 2   sacubitril-valsartan (ENTRESTO) 24-26 MG Take 1 tablet by mouth 2 (two) times daily. (Patient not taking: Reported on 07/01/2023) 180 tablet 0   aspirin EC 81 MG tablet Take 1 tablet (81 mg total) by mouth daily. 30 tablet 2   No facility-administered medications prior to visit.    ROS Review of Systems  Constitutional:  Negative for appetite change, chills, diaphoresis and fatigue.  HENT: Negative.    Eyes: Negative.   Respiratory:  Positive for shortness of breath. Negative for cough, chest tightness and wheezing.   Cardiovascular:  Negative for chest pain, palpitations and leg swelling.  Gastrointestinal:  Negative for abdominal pain, constipation, diarrhea, nausea and vomiting.  Endocrine: Negative.   Genitourinary: Negative.  Negative for difficulty urinating.  Musculoskeletal:  Positive for arthralgias, back pain and gait problem. Negative  for myalgias.  Skin: Negative.   Neurological:  Negative for dizziness and weakness.  Hematological:  Negative for adenopathy. Does not bruise/bleed easily.  Psychiatric/Behavioral:  Positive for confusion and decreased concentration. Negative for sleep disturbance. The patient is not nervous/anxious.     Objective:  BP (!) 146/86 (BP Location: Left Arm, Patient Position: Sitting, Cuff Size: Normal)   Pulse 62   Temp 97.8 F (36.6 C) (Oral)   Resp 16   Ht 5\' 9"  (1.753 m)   Wt  198 lb 12.8 oz (90.2 kg)   SpO2 94%   BMI 29.36 kg/m   BP Readings from Last 3 Encounters:  07/01/23 (!) 146/86  03/30/23 (!) 148/86  12/20/22 124/82    Wt Readings from Last 3 Encounters:  07/01/23 198 lb 12.8 oz (90.2 kg)  03/30/23 140 lb (63.5 kg)  12/26/22 184 lb (83.5 kg)    Physical Exam Vitals reviewed.  Constitutional:      General: He is not in acute distress.    Appearance: He is ill-appearing (sitting in a wheelchair). He is not toxic-appearing or diaphoretic.  HENT:     Mouth/Throat:     Mouth: Mucous membranes are moist.  Eyes:     General: No scleral icterus.    Conjunctiva/sclera: Conjunctivae normal.  Cardiovascular:     Rate and Rhythm: Normal rate and regular rhythm.     Heart sounds: No murmur heard.    No friction rub. No gallop.  Pulmonary:     Effort: Pulmonary effort is normal.     Breath sounds: No stridor. Examination of the right-lower field reveals rhonchi. Rhonchi present. No decreased breath sounds, wheezing or rales.  Abdominal:     General: Abdomen is flat.     Palpations: There is no mass.     Tenderness: There is no abdominal tenderness. There is no guarding.     Hernia: No hernia is present.  Musculoskeletal:        General: Normal range of motion.     Cervical back: Neck supple.     Right lower leg: No edema.     Left lower leg: No edema.  Lymphadenopathy:     Cervical: No cervical adenopathy.  Skin:    General: Skin is warm and dry.  Neurological:     General: No focal deficit present.     Mental Status: He is alert. Mental status is at baseline.  Psychiatric:        Mood and Affect: Mood normal.        Behavior: Behavior normal.     Lab Results  Component Value Date   WBC 8.2 03/30/2023   HGB 13.1 03/30/2023   HCT 40.0 03/30/2023   PLT 327 03/30/2023   GLUCOSE 87 03/30/2023   CHOL 109 12/19/2022   TRIG 76.0 12/19/2022   HDL 40.10 12/19/2022   LDLDIRECT 47.0 10/22/2019   LDLCALC 53 12/19/2022   ALT 15 12/26/2021    AST 18 12/26/2021   NA 137 03/30/2023   K 4.4 03/30/2023   CL 106 03/30/2023   CREATININE 1.13 03/30/2023   BUN 17 03/30/2023   CO2 24 03/30/2023   TSH 2.24 12/19/2022   PSA 0.87 03/18/2014   INR 1.2 09/20/2021   HGBA1C 5.6 08/08/2022    DG Chest 2 View Result Date: 03/30/2023 CLINICAL DATA:  Shortness of breath. EXAM: CHEST - 2 VIEW COMPARISON:  12/19/2022 FINDINGS: The lungs are clear without focal pneumonia, edema, pneumothorax or pleural effusion. The cardiopericardial silhouette  is within normal limits for size. No acute bony abnormality. Status post right shoulder replacement. Degenerative changes noted left shoulder. Telemetry leads overlie the chest. IMPRESSION: No active cardiopulmonary disease. Electronically Signed   By: Kennith Center M.D.   On: 03/30/2023 10:10    Assessment & Plan:  Need for immunization against influenza -     Flu Vaccine Trivalent High Dose (Fluad)  Left lumbar radiculopathy -     oxyCODONE-Acetaminophen; Take 1 tablet by mouth every 6 (six) hours as needed for pain.  Dispense: 100 tablet; Refill: 0  Generalized OA -     oxyCODONE-Acetaminophen; Take 1 tablet by mouth every 6 (six) hours as needed for pain.  Dispense: 100 tablet; Refill: 0  Vitamin B12 deficiency anemia due to intrinsic factor deficiency -     Cyanocobalamin  Benign essential HTN- BP is well controlled.  Chronic diastolic CHF (congestive heart failure) (HCC)- He has a normal status.     Follow-up: Return in about 3 months (around 09/28/2023).  Sanda Linger, MD

## 2023-07-19 ENCOUNTER — Other Ambulatory Visit: Payer: Self-pay | Admitting: Internal Medicine

## 2023-07-19 DIAGNOSIS — M5416 Radiculopathy, lumbar region: Secondary | ICD-10-CM

## 2023-07-19 DIAGNOSIS — M159 Polyosteoarthritis, unspecified: Secondary | ICD-10-CM

## 2023-07-19 MED ORDER — OXYCODONE-ACETAMINOPHEN 10-325 MG PO TABS: 1.0000 | ORAL_TABLET | Freq: Four times a day (QID) | ORAL | 0 refills | Status: DC | PRN

## 2023-07-19 NOTE — Telephone Encounter (Signed)
 Copied from CRM [id number]. Topic: Clinical - Medication Refill >> Jul 19, 2023 10:04 AM Theodis Sato wrote: Most Recent Primary Care Visit:  Provider: Etta Grandchild  Department: [facility] [place]  Visit Type: OFFICE VISIT  Date: 07/01/2023  Medication: oxyCODONE-acetaminophen (PERCOCET) 10-325 MG tablet  Has the patient contacted their pharmacy? No, controlled substance  (Agent: If no, request that the patient contact the pharmacy for the refill. If patient does not wish to contact the pharmacy document the reason why and proceed with request.) (Agent: If yes, when and what did the pharmacy advise?)  Is this the correct pharmacy for this prescription? Yes If no, delete pharmacy and type the correct one.  This is the patient's preferred pharmacy:  [id number] [place], [place] - [address] [place] [address] Phone: [phone] Fax: [phone]    Has the prescription been filled recently? Yes  Is the patient out of the medication? No, 3 left.  Has the patient been seen for an appointment in the last year OR does the patient have an upcoming appointment? Yes  Can we respond through MyChart? No  Agent: Please be advised that Rx refills may take up to 3 business days. We ask that you follow-up with your pharmacy.

## 2023-07-21 ENCOUNTER — Other Ambulatory Visit: Payer: Self-pay | Admitting: Internal Medicine

## 2023-08-01 ENCOUNTER — Encounter: Payer: Self-pay | Admitting: Internal Medicine

## 2023-08-01 ENCOUNTER — Ambulatory Visit (INDEPENDENT_AMBULATORY_CARE_PROVIDER_SITE_OTHER)

## 2023-08-01 ENCOUNTER — Ambulatory Visit (INDEPENDENT_AMBULATORY_CARE_PROVIDER_SITE_OTHER): Payer: Medicare Other | Admitting: Internal Medicine

## 2023-08-01 VITALS — BP 160/86 | HR 58 | Temp 97.6°F | Ht 69.0 in | Wt 196.4 lb

## 2023-08-01 DIAGNOSIS — R052 Subacute cough: Secondary | ICD-10-CM

## 2023-08-01 DIAGNOSIS — R059 Cough, unspecified: Secondary | ICD-10-CM | POA: Diagnosis not present

## 2023-08-01 DIAGNOSIS — R7989 Other specified abnormal findings of blood chemistry: Secondary | ICD-10-CM | POA: Diagnosis not present

## 2023-08-01 DIAGNOSIS — R001 Bradycardia, unspecified: Secondary | ICD-10-CM | POA: Diagnosis not present

## 2023-08-01 DIAGNOSIS — I10 Essential (primary) hypertension: Secondary | ICD-10-CM

## 2023-08-01 DIAGNOSIS — R079 Chest pain, unspecified: Secondary | ICD-10-CM

## 2023-08-01 DIAGNOSIS — R9431 Abnormal electrocardiogram [ECG] [EKG]: Secondary | ICD-10-CM | POA: Insufficient documentation

## 2023-08-01 DIAGNOSIS — I5032 Chronic diastolic (congestive) heart failure: Secondary | ICD-10-CM | POA: Diagnosis not present

## 2023-08-01 LAB — CBC WITH DIFFERENTIAL/PLATELET
Basophils Absolute: 0.1 10*3/uL (ref 0.0–0.1)
Basophils Relative: 0.9 % (ref 0.0–3.0)
Eosinophils Absolute: 0.2 10*3/uL (ref 0.0–0.7)
Eosinophils Relative: 2.9 % (ref 0.0–5.0)
HCT: 31.3 % — ABNORMAL LOW (ref 39.0–52.0)
Hemoglobin: 9.9 g/dL — ABNORMAL LOW (ref 13.0–17.0)
Lymphocytes Relative: 28.5 % (ref 12.0–46.0)
Lymphs Abs: 2.2 10*3/uL (ref 0.7–4.0)
MCHC: 31.7 g/dL (ref 30.0–36.0)
MCV: 88.1 fl (ref 78.0–100.0)
Monocytes Absolute: 0.7 10*3/uL (ref 0.1–1.0)
Monocytes Relative: 9.6 % (ref 3.0–12.0)
Neutro Abs: 4.4 10*3/uL (ref 1.4–7.7)
Neutrophils Relative %: 58.1 % (ref 43.0–77.0)
Platelets: 387 10*3/uL (ref 150.0–400.0)
RBC: 3.55 Mil/uL — ABNORMAL LOW (ref 4.22–5.81)
RDW: 15.9 % — ABNORMAL HIGH (ref 11.5–15.5)
WBC: 7.6 10*3/uL (ref 4.0–10.5)

## 2023-08-01 LAB — BASIC METABOLIC PANEL WITH GFR
BUN: 16 mg/dL (ref 6–23)
CO2: 26 meq/L (ref 19–32)
Calcium: 9.5 mg/dL (ref 8.4–10.5)
Chloride: 106 meq/L (ref 96–112)
Creatinine, Ser: 1.07 mg/dL (ref 0.40–1.50)
GFR: 64.75 mL/min (ref >60.00)
Glucose, Bld: 86 mg/dL (ref 70–99)
Potassium: 4.2 meq/L (ref 3.5–5.1)
Sodium: 139 meq/L (ref 135–145)

## 2023-08-01 LAB — TROPONIN I (HIGH SENSITIVITY): High Sens Troponin I: 10 ng/L (ref 2–17)

## 2023-08-01 LAB — BRAIN NATRIURETIC PEPTIDE: Pro B Natriuretic peptide (BNP): 247 pg/mL — ABNORMAL HIGH (ref 0.0–100.0)

## 2023-08-01 NOTE — Patient Instructions (Signed)

## 2023-08-01 NOTE — Progress Notes (Signed)
 Subjective:  Patient ID: Adrian Miller, male    DOB: 12/07/1940  Age: 83 y.o. MRN: [id number]  CC: Hypertension, Coronary Artery Disease, and Cough   HPI Adrian Miller presents for f/up ----  Discussed the use of AI scribe software for clinical note transcription with the patient, who gave verbal consent to proceed.  History of Present Illness   Adrian Miller is an 83 year old male who presents with chest pain and shortness of breath.  He has a new onset of chest pain described as a 'burning' sensation. The exact onset is unclear, but it is a recent development. The pain is mild and not associated with leg swelling.  He experiences shortness of breath, described as 'subtle wind.' He uses an albuterol inhaler when he feels he cannot catch his breath, but he is currently out of his inhaler. He has reduced his smoking to less than half of his previous usage.  He reports dizziness and lightheadedness this morning.  His bowel movements have been irregular, with the last significant movement occurring about six days ago, and only a small amount yesterday.       Outpatient Medications Prior to Visit  Medication Sig Dispense Refill   albuterol (VENTOLIN HFA) 108 (90 Base) MCG/ACT inhaler Inhale 2 puffs into the lungs every 4 (four) hours as needed for wheezing or shortness of breath. 1 each 0   atorvastatin (LIPITOR) 40 MG tablet TAKE 1 TABLET BY MOUTH EVERY DAY 90 tablet 1   clopidogrel (PLAVIX) 75 MG tablet Take 1 tablet (75 mg total) by mouth daily. 30 tablet 2   Ferric Maltol (ACCRUFER) 30 MG CAPS Take 1 capsule (30 mg total) by mouth in the morning and at bedtime. 180 capsule 1   KLOR-CON M20 20 MEQ tablet TAKE 1 TABLET BY MOUTH TWICE A DAY 180 tablet 0   metoprolol succinate (TOPROL-XL) 25 MG 24 hr tablet TAKE 1/2 TABLET BY MOUTH EVERY DAY 30 tablet 0   nitroGLYCERIN (NITROSTAT) 0.4 MG SL tablet Place 1 tablet (0.4 mg total) under the tongue every 5 (five) minutes as needed for chest  pain. 30 tablet 2   oxyCODONE-acetaminophen (PERCOCET) 10-325 MG tablet Take 1 tablet by mouth every 6 (six) hours as needed for pain. 100 tablet 0   pantoprazole (PROTONIX) 40 MG tablet TAKE 1 TABLET BY MOUTH EVERY DAY (Patient taking differently: 40 mg every morning.) 90 tablet 0   sacubitril-valsartan (ENTRESTO) 24-26 MG Take 1 tablet by mouth 2 (two) times daily. 180 tablet 0   No facility-administered medications prior to visit.    ROS Review of Systems  Constitutional:  Positive for fatigue. Negative for appetite change, chills, diaphoresis and unexpected weight change.  HENT: Negative.    Eyes: Negative.   Respiratory:  Positive for cough and shortness of breath. Negative for chest tightness and wheezing.   Cardiovascular:  Positive for chest pain. Negative for palpitations and leg swelling.  Gastrointestinal:  Negative for abdominal pain, constipation, diarrhea, nausea and vomiting.  Endocrine: Negative.   Genitourinary: Negative.   Musculoskeletal:  Positive for arthralgias, back pain and gait problem.  Skin: Negative.   Neurological:  Positive for dizziness and light-headedness.  Hematological:  Negative for adenopathy. Does not bruise/bleed easily.    Objective:  BP (!) 160/86 (BP Location: Left Arm, Patient Position: Sitting, Cuff Size: Normal)   Pulse (!) 58   Temp 97.6 F (36.4 C) (Oral)   Ht 5\' 9"  (1.753 m)   Wt 196  lb 6.4 oz (89.1 kg)   SpO2 98%   BMI 29.00 kg/m   BP Readings from Last 3 Encounters:  08/01/23 (!) 160/86  07/01/23 (!) 146/86  03/30/23 (!) 148/86    Wt Readings from Last 3 Encounters:  08/01/23 196 lb 6.4 oz (89.1 kg)  07/01/23 198 lb 12.8 oz (90.2 kg)  03/30/23 140 lb (63.5 kg)    Physical Exam Vitals reviewed.  Constitutional:      Appearance: Normal appearance.  HENT:     Nose: Nose normal.     Mouth/Throat:     Mouth: Mucous membranes are moist.  Eyes:     General: No scleral icterus.    Conjunctiva/sclera: Conjunctivae  normal.  Cardiovascular:     Rate and Rhythm: Regular rhythm. Bradycardia present.     Heart sounds: No murmur heard.    No friction rub. No gallop.     Comments: EKG ---  SB, 53 bpm Anteroseptal infarct pattern is not new TWI in lateral leads is new No LVH Pulmonary:     Effort: Pulmonary effort is normal.     Breath sounds: No stridor. No wheezing, rhonchi or rales.  Abdominal:     General: Abdomen is flat.     Palpations: There is no mass.     Tenderness: There is no abdominal tenderness. There is no guarding.     Hernia: No hernia is present.  Musculoskeletal:        General: Normal range of motion.     Cervical back: Neck supple.  Skin:    General: Skin is warm and dry.  Neurological:     General: No focal deficit present.     Mental Status: He is alert.     Lab Results  Component Value Date   WBC 8.2 03/30/2023   HGB 13.1 03/30/2023   HCT 40.0 03/30/2023   PLT 327 03/30/2023   GLUCOSE 87 03/30/2023   CHOL 109 12/19/2022   TRIG 76.0 12/19/2022   HDL 40.10 12/19/2022   LDLDIRECT 47.0 10/22/2019   LDLCALC 53 12/19/2022   ALT 15 12/26/2021   AST 18 12/26/2021   NA 137 03/30/2023   K 4.4 03/30/2023   CL 106 03/30/2023   CREATININE 1.13 03/30/2023   BUN 17 03/30/2023   CO2 24 03/30/2023   TSH 2.24 12/19/2022   PSA 0.87 03/18/2014   INR 1.2 09/20/2021   HGBA1C 5.6 08/08/2022    DG Chest 2 View Result Date: 03/30/2023 CLINICAL DATA:  Shortness of breath. EXAM: CHEST - 2 VIEW COMPARISON:  12/19/2022 FINDINGS: The lungs are clear without focal pneumonia, edema, pneumothorax or pleural effusion. The cardiopericardial silhouette is within normal limits for size. No acute bony abnormality. Status post right shoulder replacement. Degenerative changes noted left shoulder. Telemetry leads overlie the chest. IMPRESSION: No active cardiopulmonary disease. Electronically Signed   By: Kennith Center M.D.   On: 03/30/2023 10:10    DG Chest 2 View Result Date:  08/01/2023 CLINICAL DATA:  Chest pain, cough. EXAM: CHEST - 2 VIEW COMPARISON:  March 30, 2023. FINDINGS: The heart size and mediastinal contours are within normal limits. Both lungs are clear. The visualized skeletal structures are unremarkable. IMPRESSION: No active cardiopulmonary disease. Electronically Signed   By: Lupita Raider M.D.   On: 08/01/2023 12:29     Assessment & Plan:   Bradycardia - He is asx wit this -     EKG 12-Lead -     TSH; Future  Chest pain,  unspecified type -     EKG 12-Lead -     CBC with Differential/Platelet; Future -     Troponin I (High Sensitivity); Future -     Brain natriuretic peptide; Future -     DG Chest 2 View; Future  Abnormal EKG- BNP is elevated. Will evaluate with an ECHO. -     CBC with Differential/Platelet; Future -     Troponin I (High Sensitivity); Future -     Brain natriuretic peptide; Future -     ECHOCARDIOGRAM COMPLETE; Future  Subacute cough- CXR is normal. -     DG Chest 2 View; Future  Benign essential HTN -     Basic metabolic panel with GFR; Future -     CBC with Differential/Platelet; Future -     TSH; Future  Chronic diastolic CHF (congestive heart failure) (HCC) -     ECHOCARDIOGRAM COMPLETE; Future  Elevated brain natriuretic peptide (BNP) level -     ECHOCARDIOGRAM COMPLETE; Future     Follow-up: Return in about 3 months (around 11/01/2023), or if symptoms worsen or fail to improve.  Sanda Linger, MD

## 2023-08-02 LAB — TSH: TSH: 2.67 u[IU]/mL (ref 0.35–5.50)

## 2023-08-20 ENCOUNTER — Other Ambulatory Visit: Payer: Self-pay | Admitting: Internal Medicine

## 2023-08-20 DIAGNOSIS — M159 Polyosteoarthritis, unspecified: Secondary | ICD-10-CM

## 2023-08-20 DIAGNOSIS — M5416 Radiculopathy, lumbar region: Secondary | ICD-10-CM

## 2023-08-20 MED ORDER — OXYCODONE-ACETAMINOPHEN 10-325 MG PO TABS: 1.0000 | ORAL_TABLET | Freq: Four times a day (QID) | ORAL | 0 refills | Status: DC | PRN

## 2023-08-20 NOTE — Telephone Encounter (Signed)
 Copied from CRM [id number]. Topic: Clinical - Medication Refill >> Aug 20, 2023 12:04 PM Alyse July wrote: Most Recent Primary Care Visit:  Provider: Arcadio Knuckles  Department: [facility]  Visit Type: OFFICE VISIT  Date: 08/01/2023  Medication: oxyCODONE-acetaminophen (PERCOCET) 10-325 MG tablet  Has the patient contacted their pharmacy? Yes (Agent: If no, request that the patient contact the pharmacy for the refill. If patient does not wish to contact the pharmacy document the reason why and proceed with request.) (Agent: If yes, when and what did the pharmacy advise?)  Is this the correct pharmacy for this prescription? Yes If no, delete pharmacy and type the correct one.  This is the patient's preferred pharmacy:  [id number] [name], [place] - [address] [place] [address] Phone: [phone] Fax: [phone]  Has the prescription been filled recently? No  Is the patient out of the medication? No  Has the patient been seen for an appointment in the last year OR does the patient have an upcoming appointment? Yes  Can we respond through MyChart? No  Agent: Please be advised that Rx refills may take up to 3 business days. We ask that you follow-up with your pharmacy.

## 2023-08-22 ENCOUNTER — Ambulatory Visit (INDEPENDENT_AMBULATORY_CARE_PROVIDER_SITE_OTHER)

## 2023-08-22 DIAGNOSIS — R9431 Abnormal electrocardiogram [ECG] [EKG]: Secondary | ICD-10-CM

## 2023-08-22 DIAGNOSIS — I5032 Chronic diastolic (congestive) heart failure: Secondary | ICD-10-CM | POA: Diagnosis not present

## 2023-08-22 DIAGNOSIS — R7989 Other specified abnormal findings of blood chemistry: Secondary | ICD-10-CM | POA: Diagnosis not present

## 2023-08-22 LAB — ECHOCARDIOGRAM COMPLETE
AR max vel: 2.89 cm2
AV Area VTI: 3.98 cm2
AV Area mean vel: 3.16 cm2
AV Mean grad: 4 mmHg
AV Peak grad: 7.4 mmHg
AV Vena cont: 0.2 cm
Ao pk vel: 1.36 m/s
Area-P 1/2: 3 cm2
Calc EF: 54.6 %
Est EF: 45
P 1/2 time: 583 ms
S' Lateral: 3.49 cm
Single Plane A2C EF: 51.6 %
Single Plane A4C EF: 50.5 %

## 2023-08-26 ENCOUNTER — Encounter: Payer: Self-pay | Admitting: Internal Medicine

## 2023-09-19 ENCOUNTER — Other Ambulatory Visit: Payer: Self-pay | Admitting: Internal Medicine

## 2023-09-19 DIAGNOSIS — M5416 Radiculopathy, lumbar region: Secondary | ICD-10-CM

## 2023-09-19 DIAGNOSIS — M159 Polyosteoarthritis, unspecified: Secondary | ICD-10-CM

## 2023-09-19 MED ORDER — OXYCODONE-ACETAMINOPHEN 10-325 MG PO TABS: 1.0000 | ORAL_TABLET | Freq: Four times a day (QID) | ORAL | 0 refills | Status: DC | PRN

## 2023-09-19 NOTE — Telephone Encounter (Signed)
 Copied from CRM [id number]. Topic: Clinical - Medication Refill >> Sep 19, 2023 11:13 AM Trula Gable C wrote: Medication: oxyCODONE -acetaminophen  (PERCOCET) 10-325 MG tablet   Has the patient contacted their pharmacy? Yes (Agent: If no, request that the patient contact the pharmacy for the refill. If patient does not wish to contact the pharmacy document the reason why and proceed with request.) (Agent: If yes, when and what did the pharmacy advise?)  This is the patient's preferred pharmacy:  [id number] [name] [facility] - [address] [place] [address] Phone: [phone] Fax: [phone]   Is this the correct pharmacy for this prescription? Yes If no, delete pharmacy and type the correct one.   Has the prescription been filled recently? No  Is the patient out of the medication? Yes  Has the patient been seen for an appointment in the last year OR does the patient have an upcoming appointment? Yes  Can we respond through MyChart? Yes  Agent: Please be advised that Rx refills may take up to 3 business days. We ask that you follow-up with your pharmacy.

## 2023-10-18 ENCOUNTER — Other Ambulatory Visit: Payer: Self-pay | Admitting: Internal Medicine

## 2023-10-18 DIAGNOSIS — M5416 Radiculopathy, lumbar region: Secondary | ICD-10-CM

## 2023-10-18 DIAGNOSIS — M159 Polyosteoarthritis, unspecified: Secondary | ICD-10-CM

## 2023-10-18 NOTE — Telephone Encounter (Unsigned)
 Copied from CRM [id number]. Topic: Clinical - Medication Refill >> Oct 18, 2023 10:10 AM Adrian Miller wrote: Medication: oxyCODONE -acetaminophen  (PERCOCET) 10-325 MG tablet  Has the patient contacted their pharmacy? Yes (Agent: If no, request that the patient contact the pharmacy for the refill. If patient does not wish to contact the pharmacy document the reason why and proceed with request.) (Agent: If yes, when and what did the pharmacy advise?)  This is the patient's preferred pharmacy:  [id number] [name] [facility] - [address] [place] [address] Phone: [phone] Fax: [phone]  Is this the correct pharmacy for this prescription? Yes If no, delete pharmacy and type the correct one.   Has the prescription been filled recently? No  Is the patient out of the medication? Yes  Has the patient been seen for an appointment in the last year OR does the patient have an upcoming appointment? Yes  Can we respond through MyChart? Yes  Agent: Please be advised that Rx refills may take up to 3 business days. We ask that you follow-up with your pharmacy.

## 2023-10-20 MED ORDER — OXYCODONE-ACETAMINOPHEN 10-325 MG PO TABS: 1.0000 | ORAL_TABLET | Freq: Four times a day (QID) | ORAL | 0 refills | Status: DC | PRN

## 2023-11-11 ENCOUNTER — Emergency Department (HOSPITAL_BASED_OUTPATIENT_CLINIC_OR_DEPARTMENT_OTHER)
Admission: EM | Admit: 2023-11-11 | Discharge: 2023-11-11 | Disposition: A | Discharge status: Home / Self Care | Attending: Emergency Medicine | Admitting: Emergency Medicine

## 2023-11-11 ENCOUNTER — Emergency Department (HOSPITAL_BASED_OUTPATIENT_CLINIC_OR_DEPARTMENT_OTHER)

## 2023-11-11 ENCOUNTER — Other Ambulatory Visit: Payer: Self-pay

## 2023-11-11 ENCOUNTER — Encounter (HOSPITAL_BASED_OUTPATIENT_CLINIC_OR_DEPARTMENT_OTHER): Payer: Self-pay | Admitting: Emergency Medicine

## 2023-11-11 DIAGNOSIS — R062 Wheezing: Secondary | ICD-10-CM | POA: Diagnosis not present

## 2023-11-11 DIAGNOSIS — I110 Hypertensive heart disease with heart failure: Secondary | ICD-10-CM | POA: Diagnosis not present

## 2023-11-11 DIAGNOSIS — I509 Heart failure, unspecified: Secondary | ICD-10-CM | POA: Insufficient documentation

## 2023-11-11 DIAGNOSIS — I700 Atherosclerosis of aorta: Secondary | ICD-10-CM | POA: Diagnosis not present

## 2023-11-11 DIAGNOSIS — I771 Stricture of artery: Secondary | ICD-10-CM | POA: Diagnosis not present

## 2023-11-11 DIAGNOSIS — R0602 Shortness of breath: Secondary | ICD-10-CM | POA: Insufficient documentation

## 2023-11-11 LAB — CBC WITH DIFFERENTIAL/PLATELET
Abs Immature Granulocytes: 0.01 K/uL (ref 0.00–0.07)
Basophils Absolute: 0 K/uL (ref 0.0–0.1)
Basophils Relative: 1 %
Eosinophils Absolute: 0.1 K/uL (ref 0.0–0.5)
Eosinophils Relative: 2 %
HCT: 30.7 % — ABNORMAL LOW (ref 39.0–52.0)
Hemoglobin: 9.4 g/dL — ABNORMAL LOW (ref 13.0–17.0)
Immature Granulocytes: 0 %
Lymphocytes Relative: 24 %
Lymphs Abs: 1.5 K/uL (ref 0.7–4.0)
MCH: 26 pg (ref 26.0–34.0)
MCHC: 30.6 g/dL (ref 30.0–36.0)
MCV: 84.8 fL (ref 80.0–100.0)
Monocytes Absolute: 0.5 K/uL (ref 0.1–1.0)
Monocytes Relative: 8 %
Neutro Abs: 4.1 K/uL (ref 1.7–7.7)
Neutrophils Relative %: 65 %
Platelets: 387 K/uL (ref 150–400)
RBC: 3.62 MIL/uL — ABNORMAL LOW (ref 4.22–5.81)
RDW: 24.4 % — ABNORMAL HIGH (ref 11.5–15.5)
WBC: 6.3 K/uL (ref 4.0–10.5)
nRBC: 0 % (ref 0.0–0.2)

## 2023-11-11 LAB — COMPREHENSIVE METABOLIC PANEL WITH GFR
ALT: 6 U/L (ref 0–44)
AST: 16 U/L (ref 15–41)
Albumin: 4 g/dL (ref 3.5–5.0)
Alkaline Phosphatase: 106 U/L (ref 38–126)
Anion gap: 10 (ref 5–15)
BUN: 18 mg/dL (ref 8–23)
CO2: 22 mmol/L (ref 22–32)
Calcium: 8.8 mg/dL — ABNORMAL LOW (ref 8.9–10.3)
Chloride: 108 mmol/L (ref 98–111)
Creatinine, Ser: 0.97 mg/dL (ref 0.61–1.24)
GFR, Estimated: >60 mL/min (ref >60)
Glucose, Bld: 109 mg/dL — ABNORMAL HIGH (ref 70–99)
Potassium: 3.9 mmol/L (ref 3.5–5.1)
Sodium: 141 mmol/L (ref 135–145)
Total Bilirubin: 0.3 mg/dL (ref 0.0–1.2)
Total Protein: 7 g/dL (ref 6.5–8.1)

## 2023-11-11 LAB — PRO BRAIN NATRIURETIC PEPTIDE: Pro Brain Natriuretic Peptide: 364 pg/mL — ABNORMAL HIGH (ref <300.0)

## 2023-11-11 LAB — TROPONIN T, HIGH SENSITIVITY
Troponin T High Sensitivity: 16 ng/L (ref <19)
Troponin T High Sensitivity: 16 ng/L (ref <19)

## 2023-11-11 MED ORDER — PREDNISONE 50 MG PO TABS
60.0000 mg | ORAL_TABLET | Freq: Once | ORAL | Status: AC
  Administered 2023-11-11: 60 mg via ORAL
  Filled 2023-11-11: qty 1

## 2023-11-11 MED ORDER — ALBUTEROL SULFATE (2.5 MG/3ML) 0.083% IN NEBU
5.0000 mg | INHALATION_SOLUTION | Freq: Once | RESPIRATORY_TRACT | Status: AC
  Administered 2023-11-11: 5 mg via RESPIRATORY_TRACT

## 2023-11-11 MED ORDER — PREDNISONE 10 MG PO TABS: 40.0000 mg | ORAL_TABLET | Freq: Every day | ORAL | 0 refills | Status: AC

## 2023-11-11 MED ORDER — ALBUTEROL SULFATE HFA 108 (90 BASE) MCG/ACT IN AERS
2.0000 | INHALATION_SPRAY | Freq: Once | RESPIRATORY_TRACT | Status: AC
  Administered 2023-11-11: 2 via RESPIRATORY_TRACT
  Filled 2023-11-11: qty 6.7

## 2023-11-11 MED ORDER — ALBUTEROL SULFATE (2.5 MG/3ML) 0.083% IN NEBU
INHALATION_SOLUTION | RESPIRATORY_TRACT | Status: AC
  Administered 2023-11-11: 5 mg via RESPIRATORY_TRACT
  Filled 2023-11-11: qty 6

## 2023-11-11 NOTE — ED Provider Notes (Signed)
 [place] [facility] AT [facility] Provider Note   CSN: [id number] Arrival date & time: 11/11/23  1022     Patient presents with: Shortness of Breath   Adrian Miller is a 83 y.o. male.  {Add pertinent medical, surgical, social history, OB history to HPI:32947} HPI     83 year old male with a history of hypertension, upper GI bleeding due to gastric ulcer, lower GI bleeding due to diverticulosis, NSTEMI, congestive heart failure   Had back pain a few weeks ago, took some BCs, then had bloody stool 2 weeks, then has been better without continued bloody stool, did take iron  and had black stool and a little bit now  3 weeks of dyspnea, worse when first get up in the morning  Slowly worsening over the last 3 weeks Most severe today Very little chest pain Very little headache Little bit of cough, not coughing anything up No fever  Pain all day long every day. No leg swelling No abdominal pain. Lays down flat without problems  Grandson reports it is hot in the home, daughter stays with him sometimes  Hx smoking, has done so for a long time. No known copd or asthma   Past Medical History:  Diagnosis Date   Acute esophagitis 06/01/2015   Arthritis    Benign essential HTN 05/31/2015   Duodenal ulcer hemorrhage    Esophageal stricture 06/01/2015   Gastric ulcer    Hiatal hernia 06/01/2015     Prior to Admission medications   Medication Sig Start Date End Date Taking? Authorizing Provider  albuterol  (VENTOLIN  HFA) 108 (90 Base) MCG/ACT inhaler Inhale 2 puffs into the lungs every 4 (four) hours as needed for wheezing or shortness of breath. 03/30/23   Kingsley, Victoria K, DO  atorvastatin  (LIPITOR) 40 MG tablet TAKE 1 TABLET BY MOUTH EVERY DAY 06/27/23   Joshua Debby LITTIE, MD  clopidogrel  (PLAVIX ) 75 MG tablet Take 1 tablet (75 mg total) by mouth daily. 09/23/21   Patwardhan, Newman PARAS, MD  Ferric Maltol  (ACCRUFER ) 30 MG CAPS Take 1 capsule (30 mg total) by mouth  in the morning and at bedtime. 12/19/22   Joshua Debby LITTIE, MD  KLOR-CON  M20 20 MEQ tablet TAKE 1 TABLET BY MOUTH TWICE A DAY 06/22/22   Joshua Debby LITTIE, MD  metoprolol  succinate (TOPROL -XL) 25 MG 24 hr tablet TAKE 1/2 TABLET BY MOUTH EVERY DAY 07/23/23   Joshua Debby LITTIE, MD  nitroGLYCERIN  (NITROSTAT ) 0.4 MG SL tablet Place 1 tablet (0.4 mg total) under the tongue every 5 (five) minutes as needed for chest pain. 09/22/21   Patwardhan, Newman PARAS, MD  oxyCODONE -acetaminophen  (PERCOCET) 10-325 MG tablet Take 1 tablet by mouth every 6 (six) hours as needed for pain. 10/20/23   Joshua Debby LITTIE, MD  pantoprazole  (PROTONIX ) 40 MG tablet TAKE 1 TABLET BY MOUTH EVERY DAY Patient taking differently: 40 mg every morning. 05/22/21   Aneita Gwendlyn DASEN, MD  sacubitril -valsartan  (ENTRESTO ) 24-26 MG Take 1 tablet by mouth 2 (two) times daily. 12/21/22   Joshua Debby LITTIE, MD    Allergies: Dorethia Jenkins ] and [name] [aspirin -salicylamide-caffeine]    Review of Systems  Updated Vital Signs BP (!) 115/93 (BP Location: Left Arm)   Pulse 83   Temp 98.6 F (37 C)   Resp 20   SpO2 98%   Physical Exam  (all labs ordered are listed, but only abnormal results are displayed) Labs Reviewed - No data to display  EKG: None  Radiology:  No results found.  {Document cardiac monitor, telemetry assessment procedure when appropriate:32947} Procedures   Medications Ordered in the ED  albuterol  (PROVENTIL ) (2.5 MG/3ML) 0.083% nebulizer solution 5 mg (has no administration in time range)  albuterol  (PROVENTIL ) (2.5 MG/3ML) 0.083% nebulizer solution (has no administration in time range)      {Click here for ABCD2, HEART and other calculators REFRESH Note before signing:1}                              Medical Decision Making Amount and/or Complexity of Data Reviewed Labs: ordered. Radiology: ordered.  Risk Prescription drug management.   ***  {Document critical care time when appropriate  Document  review of labs and clinical decision tools ie CHADS2VASC2, etc  Document your independent review of radiology images and any outside records  Document your discussion with family members, caretakers and with consultants  Document social determinants of health affecting pt's care  Document your decision making why or why not admission, treatments were needed:32947:::1}   Final diagnoses:  None    ED Discharge Orders     None

## 2023-11-11 NOTE — ED Notes (Signed)
 Attempted to contact patient regarding cane left behind, pt did not answer. Cane placed in [facility] lost and found, Diplomatic Services operational officer notified.

## 2023-11-11 NOTE — ED Triage Notes (Signed)
 C/o SHOB x couple weeks. Worse with exertion. Denies COPD or asthma. Poor historian.

## 2023-11-11 NOTE — ED Notes (Signed)
 Pt d/c instructions, medications, and follow-up care reviewed with pt. Pt verbalized understanding and had no further questions at time of d/c. Pt CA&Ox4 and in NAD at time of d/c

## 2023-11-12 DIAGNOSIS — M47816 Spondylosis without myelopathy or radiculopathy, lumbar region: Secondary | ICD-10-CM | POA: Diagnosis not present

## 2023-11-12 NOTE — ED Notes (Signed)
 Spoke with Adrian Miller by phone and let him know his cane is here, he advised he was at the doctor's office and would come by to get it when he leaves the doctor's office. Registration was advised pt will be coming to pick up cane, cane left with registration.

## 2023-11-19 ENCOUNTER — Telehealth: Payer: Self-pay | Admitting: Internal Medicine

## 2023-11-19 DIAGNOSIS — M159 Polyosteoarthritis, unspecified: Secondary | ICD-10-CM

## 2023-11-19 DIAGNOSIS — M5416 Radiculopathy, lumbar region: Secondary | ICD-10-CM

## 2023-11-19 MED ORDER — OXYCODONE-ACETAMINOPHEN 10-325 MG PO TABS: 1.0000 | ORAL_TABLET | Freq: Four times a day (QID) | ORAL | 0 refills | Status: DC | PRN

## 2023-11-19 NOTE — Telephone Encounter (Signed)
 Copied from CRM [id number]. Topic: Clinical - Medication Refill >> Nov 19, 2023  8:47 AM Wess RAMAN wrote: Medication: oxyCODONE -acetaminophen  (PERCOCET) 10-325 MG tablet   Has the patient contacted their pharmacy? No (Agent: If no, request that the patient contact the pharmacy for the refill. If patient does not wish to contact the pharmacy document the reason why and proceed with request.) (Agent: If yes, when and what did the pharmacy advise?)  This is the patient's preferred pharmacy:  [id number] [place] [facility] - [address] [place] [address] Phone: [phone] Fax: [phone]  Is this the correct pharmacy for this prescription? Yes If no, delete pharmacy and type the correct one.   Has the prescription been filled recently? Yes  Is the patient out of the medication? Yes  Has the patient been seen for an appointment in the last year OR does the patient have an upcoming appointment? Yes  Can we respond through MyChart? No  Agent: Please be advised that Rx refills may take up to 3 business days. We ask that you follow-up with your pharmacy.

## 2023-11-20 DIAGNOSIS — M47816 Spondylosis without myelopathy or radiculopathy, lumbar region: Secondary | ICD-10-CM | POA: Diagnosis not present

## 2023-12-20 ENCOUNTER — Other Ambulatory Visit: Payer: Self-pay | Admitting: Internal Medicine

## 2023-12-20 DIAGNOSIS — M159 Polyosteoarthritis, unspecified: Secondary | ICD-10-CM

## 2023-12-20 DIAGNOSIS — M5416 Radiculopathy, lumbar region: Secondary | ICD-10-CM

## 2023-12-20 NOTE — Telephone Encounter (Signed)
 Copied from CRM #[id number]. Topic: Clinical - Medication Refill >> Dec 20, 2023  9:22 AM Pinkey ORN wrote: Medication: oxyCODONE -acetaminophen  (PERCOCET) 10-325 MG tablet  Has the patient contacted their pharmacy? Yes (Agent: If no, request that the patient contact the pharmacy for the refill. If patient does not wish to contact the pharmacy document the reason why and proceed with request.) (Agent: If yes, when and what did the pharmacy advise?)  This is the patient's preferred pharmacy:  [id number] [place] [facility] - [address] [place] [address] Phone: [phone] Fax: [phone]   Is this the correct pharmacy for this prescription? Yes If no, delete pharmacy and type the correct one.   Has the prescription been filled recently? No  Is the patient out of the medication? Yes  Has the patient been seen for an appointment in the last year OR does the patient have an upcoming appointment? Yes  Can we respond through MyChart? Yes  Agent: Please be advised that Rx refills may take up to 3 business days. We ask that you follow-up with your pharmacy.

## 2023-12-24 ENCOUNTER — Other Ambulatory Visit: Payer: Self-pay | Admitting: Internal Medicine

## 2023-12-24 DIAGNOSIS — E785 Hyperlipidemia, unspecified: Secondary | ICD-10-CM

## 2023-12-25 MED ORDER — OXYCODONE-ACETAMINOPHEN 10-325 MG PO TABS: 1.0000 | ORAL_TABLET | Freq: Four times a day (QID) | ORAL | 0 refills | Status: DC | PRN

## 2023-12-25 NOTE — Telephone Encounter (Signed)
 Copied from CRM [id number]. Topic: Clinical - Medication Refill >> Dec 25, 2023  8:55 AM Kathrin PARAS wrote: Medication: oxyCODONE -acetaminophen  (PERCOCET) 10-325 MG tablet  Has the patient contacted their pharmacy? No (Agent: If no, request that the patient contact the pharmacy for the refill. If patient does not wish to contact the pharmacy document the reason why and proceed with request.) (Agent: If yes, when and what did the pharmacy advise?)  This is the patient's preferred pharmacy:  [id number] [place], [place] - [address] [place] [address] Phone: [phone] Fax: [phone]  [facility] [place], [place] - [id number] [place] [address] Phone: [phone] Fax: [phone]  Is this the correct pharmacy for this prescription? Yes If no, delete pharmacy and type the correct one.   Has the prescription been filled recently? Yes  Is the patient out of the medication? Yes  Has the patient been seen for an appointment in the last year OR does the patient have an upcoming appointment? Yes  Can we respond through MyChart? Yes  Agent: Please be advised that Rx refills may take up to 3 business days. We ask that you follow-up with your pharmacy.

## 2023-12-26 ENCOUNTER — Ambulatory Visit (INDEPENDENT_AMBULATORY_CARE_PROVIDER_SITE_OTHER)

## 2023-12-26 VITALS — Ht 70.0 in | Wt 196.0 lb

## 2023-12-26 DIAGNOSIS — I739 Peripheral vascular disease, unspecified: Secondary | ICD-10-CM | POA: Diagnosis not present

## 2023-12-26 DIAGNOSIS — N182 Chronic kidney disease, stage 2 (mild): Secondary | ICD-10-CM

## 2023-12-26 DIAGNOSIS — Z0000 Encounter for general adult medical examination without abnormal findings: Secondary | ICD-10-CM

## 2023-12-26 NOTE — Patient Instructions (Signed)
 Mr. Blue , Thank you for taking time out of your busy schedule to complete your Annual Wellness Visit with me. I enjoyed our conversation and look forward to speaking with you again next year. I, as well as your care team,  appreciate your ongoing commitment to your health goals. Please review the following plan we discussed and let me know if I can assist you in the future. Your Game plan/ To Do List    Follow up Visits: We will see or speak with you next year for your Next Medicare AWV with our clinical staff Have you seen your provider in the last 6 months (3 months if uncontrolled diabetes)? Yes.  Last office visit on 08/01/2023.  Clinician Recommendations:  Aim for 30 minutes of exercise or brisk walking, 6-8 glasses of water , and 5 servings of fruits and vegetables each day. You are due for a foot exam, a A1c check and a kidney evaluation.  You can get these done during your next office visit with Dr. Joshua.      This is a list of the screenings recommended for you:  Health Maintenance  Topic Date Due   Yearly kidney health urinalysis for diabetes  Never done   Eye exam for diabetics  02/25/2015   COVID-19 Vaccine (2 - 2024-25 season) 01/06/2023   Hemoglobin A1C  02/07/2023   Complete foot exam   04/05/2023   Medicare Annual Wellness Visit  12/26/2023   Flu Shot  12/06/2023   DTaP/Tdap/Td vaccine (2 - Td or Tdap) 06/30/2024*   Yearly kidney function blood test for diabetes  11/10/2024   Pneumococcal Vaccine for age over 31  Completed   HPV Vaccine  Aged Out   Meningitis B Vaccine  Aged Out   Screening for Lung Cancer  Discontinued   Hepatitis C Screening  Discontinued   Zoster (Shingles) Vaccine  Discontinued  *Topic was postponed. The date shown is not the original due date.    Advanced directives: (Copy Requested) Please bring a copy of your health care power of attorney and living will to the office to be added to your chart at your convenience. You can mail to [facility] [address] [place], [place] [address] or email to [email or web address] Advance Care Planning is important because it:  [x]  Makes sure you receive the medical care that is consistent with your values, goals, and preferences  [x]  It provides guidance to your family and loved ones and reduces their decisional burden about whether or not they are making the right decisions based on your wishes.  Follow the link provided in your after visit summary or read over the paperwork we have mailed to you to help you started getting your Advance Directives in place. If you need assistance in completing these, please reach out to us  so that we can help you!  See attachments for Preventive Care and Fall Prevention Tips.

## 2023-12-26 NOTE — Progress Notes (Signed)
 Subjective:   Adrian Miller is a 83 y.o. who presents for a Medicare Wellness preventive visit.  As a reminder, Annual Wellness Visits don't include a physical exam, and some assessments may be limited, especially if this visit is performed virtually. We may recommend an in-person follow-up visit with your provider if needed.  Visit Complete: Virtual I connected with  Adrian Miller on 12/26/23 by a audio enabled telemedicine application and verified that I am speaking with the correct person using two identifiers.  Patient Location: Home  Provider Location: Office/Clinic  I discussed the limitations of evaluation and management by telemedicine. The patient expressed understanding and agreed to proceed.  Vital Signs: Because this visit was a virtual/telehealth visit, some criteria may be missing or patient reported. Any vitals not documented were not able to be obtained and vitals that have been documented are patient reported.  VideoDeclined- This patient declined Librarian, academic. Therefore the visit was completed with audio only.  Persons Participating in Visit: Patient.  AWV Questionnaire: No: Patient Medicare AWV questionnaire was not completed prior to this visit.  Cardiac Risk Factors include: advanced age (>23men, >72 women);male gender;Other (see comment), Risk factor comments: PAD, CHF     Objective:    Today's Vitals   12/26/23 1401  Weight: 196 lb (88.9 kg)  Height: 5' 10 (1.778 m)  PainSc: 10-Worst pain ever   Body mass index is 28.12 kg/m.     12/26/2023    2:11 PM 11/11/2023   10:32 AM 03/30/2023    9:01 AM 12/26/2022   10:37 AM 12/25/2021   10:40 AM 09/20/2021    6:21 PM 06/10/2021    7:38 AM  Advanced Directives  Does Patient Have a Medical Advance Directive? Yes No No Yes No Unable to assess, patient is non-responsive or altered mental status No  Type of Advance Directive Healthcare Power of [place];Living will   Healthcare  Power of [place];Living will     Copy of Healthcare Power of Attorney in Chart? No - copy requested   No - copy requested     Would patient like information on creating a medical advance directive?  No - Patient declined   No - Patient declined  No - Patient declined    Current Medications (verified) Outpatient Encounter Medications as of 12/26/2023  Medication Sig   albuterol  (VENTOLIN  HFA) 108 (90 Base) MCG/ACT inhaler Inhale 2 puffs into the lungs every 4 (four) hours as needed for wheezing or shortness of breath.   atorvastatin  (LIPITOR) 40 MG tablet TAKE 1 TABLET BY MOUTH EVERY DAY   clopidogrel  (PLAVIX ) 75 MG tablet Take 1 tablet (75 mg total) by mouth daily.   Ferric Maltol  (ACCRUFER ) 30 MG CAPS Take 1 capsule (30 mg total) by mouth in the morning and at bedtime.   KLOR-CON  M20 20 MEQ tablet TAKE 1 TABLET BY MOUTH TWICE A DAY   metoprolol  succinate (TOPROL -XL) 25 MG 24 hr tablet TAKE 1/2 TABLET BY MOUTH EVERY DAY   nitroGLYCERIN  (NITROSTAT ) 0.4 MG SL tablet Place 1 tablet (0.4 mg total) under the tongue every 5 (five) minutes as needed for chest pain.   oxyCODONE -acetaminophen  (PERCOCET) 10-325 MG tablet Take 1 tablet by mouth every 6 (six) hours as needed for pain.   pantoprazole  (PROTONIX ) 40 MG tablet TAKE 1 TABLET BY MOUTH EVERY DAY (Patient taking differently: 40 mg every morning.)   sacubitril -valsartan  (ENTRESTO ) 24-26 MG Take 1 tablet by mouth 2 (two) times daily.   No  facility-administered encounter medications on file as of 12/26/2023.    Allergies (verified) Adrian Miller ] and Adrian Miller [aspirin -salicylamide-caffeine]   History: Past Medical History:  Diagnosis Date   Acute esophagitis 06/01/2015   Arthritis    Benign essential HTN 05/31/2015   Duodenal ulcer hemorrhage    Esophageal stricture 06/01/2015   Gastric ulcer    Hiatal hernia 06/01/2015   Past Surgical History:  Procedure Laterality Date   BIOPSY  05/20/2020   Procedure: BIOPSY;  Surgeon:  San Sandor GAILS, DO;  Location: [facility];  Service: Gastroenterology;;   CHOLECYSTECTOMY N/A 09/30/2017   Procedure: LAPAROSCOPIC CHOLECYSTECTOMY WITH INTRAOPERATIVE CHOLANGIOGRAM;  Surgeon: Mikell Katz, MD;  Location: [facility];  Service: General;  Laterality: N/A;   COLONOSCOPY     ESOPHAGOGASTRODUODENOSCOPY N/A 05/31/2015   Procedure: ESOPHAGOGASTRODUODENOSCOPY (EGD);  Surgeon: Gwendlyn ONEIDA Buddy, MD;  Location: [facility];  Service: Endoscopy;  Laterality: N/A;   ESOPHAGOGASTRODUODENOSCOPY (EGD) WITH PROPOFOL  N/A 05/20/2020   Procedure: ESOPHAGOGASTRODUODENOSCOPY (EGD) WITH PROPOFOL ;  Surgeon: San Sandor GAILS, DO;  Location: [facility];  Service: Gastroenterology;  Laterality: N/A;   HERNIA REPAIR  1975   LEFT HEART CATH AND CORONARY ANGIOGRAPHY N/A 09/21/2021   Procedure: LEFT HEART CATH AND CORONARY ANGIOGRAPHY;  Surgeon: Elmira Newman PARAS, MD;  Location: [facility];  Service: Cardiovascular;  Laterality: N/A;   LEFT ROTATOR CUFF REPAIR  2002   REPLACEMENT TOTAL KNEE Left    REVERSE SHOULDER ARTHROPLASTY Right 03/20/2018   Procedure: RIGHT REVERSE SHOULDER ARTHROPLASTY;  Surgeon: Dozier Soulier, MD;  Location: [facility];  Service: Orthopedics;  Laterality: Right;   TOTAL KNEE ARTHROPLASTY Right 02/22/2015   Procedure: TOTAL KNEE ARTHROPLASTY;  Surgeon: Maude Herald, MD;  Location: [facility];  Service: Orthopedics;  Laterality: Right;   Family History  Problem Relation Age of Onset   Hypertension Mother    Heart attack Sister    Colon cancer Neg Hx    Esophageal cancer Neg Hx    Rectal cancer Neg Hx    Stomach cancer Neg Hx    Social History   Socioeconomic History   Marital status: Single    Spouse name: Not on file   Number of children: Not on file   Years of education: Not on file   Highest education level: Not on file  Occupational History   Occupation: RETIRED  Tobacco Use   Smoking status: Every Day    Current packs/day: 0.50    Average packs/day:  0.5 packs/day for 51.0 years (25.5 ttl pk-yrs)    Types: Cigarettes   Smokeless tobacco: Former    Quit date: 11/03/2014  Vaping Use   Vaping status: Never Used  Substance and Sexual Activity   Alcohol use: No    Alcohol/week: 0.0 standard drinks of alcohol   Drug use: No   Sexual activity: Never  Other Topics Concern   Not on file  Social History Narrative   Lives with daughter/2025   Social Drivers of Health   Financial Resource Strain: High Risk (12/26/2023)   Overall Financial Resource Strain (CARDIA)    Difficulty of Paying Living Expenses: Very hard  Food Insecurity: No Food Insecurity (12/26/2023)   Hunger Vital Sign    Worried About Running Out of Food in the Last Year: Never true    Ran Out of Food in the Last Year: Never true  Transportation Needs: No Transportation Needs (12/26/2023)   PRAPARE - Transportation    Lack of Transportation (Medical): No    Lack  of Transportation (Non-Medical): No  Physical Activity: Inactive (12/26/2023)   Exercise Vital Sign    Days of Exercise per Week: 0 days    Minutes of Exercise per Session: 0 min  Stress: No Stress Concern Present (12/26/2023)   [employer] of Occupational Health - Occupational Stress Questionnaire    Feeling of Stress: Not at all  Social Connections: Socially Isolated (12/26/2023)   Social Connection and Isolation Panel    Frequency of Communication with Friends and Family: More than three times a week    Frequency of Social Gatherings with Friends and Family: Once a week    Attends Religious Services: Never    Database administrator or Organizations: No    Attends Banker Meetings: Never    Marital Status: Widowed    Tobacco Counseling Ready to quit: Not Answered Counseling given: Not Answered    Clinical Intake:  Pre-visit preparation completed: Yes  Pain : 0-10 Pain Score: 10-Worst pain ever Pain Type: Chronic pain Pain Location: Back (both hips and shoulders) Pain Descriptors  / Indicators: Aching, Discomfort Pain Onset: More than a month ago Pain Frequency: Constant Pain Relieving Factors: Oxycodone , Tylenol  Effect of Pain on Daily Activities: moving around  Pain Relieving Factors: Oxycodone , Tylenol   BMI - recorded: 28.12 Nutritional Status: BMI 25 -29 Overweight Nutritional Risks: None Diabetes: No  Lab Results  Component Value Date   HGBA1C 5.6 08/08/2022   HGBA1C 5.7 (H) 09/20/2021   HGBA1C 6.2 03/23/2021     How often do you need to have someone help you when you read instructions, pamphlets, or other written materials from your doctor or pharmacy?: 1 - Never  Interpreter Needed?: No  Information entered by :: Eldred Sooy, RMA   Activities of Daily Living     12/26/2023    2:07 PM  In your present state of health, do you have any difficulty performing the following activities:  Hearing? 1  Comment Has issues with hearing  Vision? 0  Difficulty concentrating or making decisions? 0  Walking or climbing stairs? 0  Dressing or bathing? 0  Doing errands, shopping? 0  Comment very little -per pt/daughter drives him around  Preparing Food and eating ? N  Using the Toilet? N  In the past six months, have you accidently leaked urine? N  Do you have problems with loss of bowel control? N  Managing your Medications? N  Managing your Finances? N  Housekeeping or managing your Housekeeping? N    Patient Care Team: Joshua Debby CROME, MD as PCP - General (Internal Medicine)  I have updated your Care Teams any recent Medical Services you may have received from other providers in the past year.     Assessment:   This is a routine wellness examination for Adrian Miller.  Hearing/Vision screen Hearing Screening - Comments:: Has issues with hearing Vision Screening - Comments:: Wears eyeglasses/   Goals Addressed   None    Depression Screen     12/26/2023    2:13 PM 12/26/2022   10:40 AM 12/26/2021    8:33 AM 12/25/2021   10:40 AM 12/25/2021    10:35 AM 06/27/2021    1:14 PM 12/02/2018    8:16 AM  PHQ 2/9 Scores  PHQ - 2 Score 2 0 0 0 0 0 0  PHQ- 9 Score 4 2 0        Fall Risk     12/26/2023    2:11 PM 12/26/2022   10:40 AM 12/25/2021  10:42 AM 12/25/2021   10:41 AM 06/27/2021    1:14 PM  Fall Risk   Falls in the past year? 0 0 0 0 0  Number falls in past yr: 0 0 0 0   Injury with Fall? 0 0 0 0   Risk for fall due to :  No Fall Risks     Follow up Falls evaluation completed;Falls prevention discussed Falls prevention discussed Falls evaluation completed;Education provided  Falls evaluation completed;Education provided    Comment   uses cane and walker        Data saved with a previous flowsheet row definition    MEDICARE RISK AT HOME:  Medicare Risk at Home Any stairs in or around the home?: Yes (basemant steps) If so, are there any without handrails?: No Home free of loose throw rugs in walkways, pet beds, electrical cords, etc?: Yes Adequate lighting in your home to reduce risk of falls?: Yes Life alert?: No Use of a cane, walker or w/c?: Yes (walker and cane) Grab bars in the bathroom?: Yes Shower chair or bench in shower?: Yes Elevated toilet seat or a handicapped toilet?: Yes  TIMED UP AND GO:  Was the test performed?  No  Cognitive Function: Declined/Normal: No cognitive concerns noted by patient or family. Patient alert, oriented, able to answer questions appropriately and recall recent events. No signs of memory loss or confusion.        12/26/2022   10:43 AM 12/25/2021   10:42 AM 12/25/2021   10:41 AM  6CIT Screen  What Year? 0 points 0 points 0 points  What month? 0 points 0 points 0 points  What time? 0 points 0 points 0 points  Count back from 20 0 points 0 points 0 points  Months in reverse 0 points 0 points 0 points  Repeat phrase 0 points 0 points 0 points  Total Score 0 points 0 points 0 points    Immunizations Immunization History  Administered Date(s) Administered   Fluad Quad(high  Dose 65+) 04/29/2019, 04/25/2020, 06/27/2021   Fluad Trivalent(High Dose 65+) 07/01/2023   Influenza Split 02/07/2012   Influenza, High Dose Seasonal PF 01/22/2018   Influenza,inj,Quad PF,6+ Mos 03/18/2014, 02/24/2015, 12/26/2015   PFIZER Comirnaty(Gray Top)Covid-19 Tri-Sucrose Vaccine 06/02/2020   Pneumococcal Conjugate-13 03/18/2014   Pneumococcal Polysaccharide-23 11/14/2010, 10/17/2017, 06/27/2021   Tdap 11/14/2010   Zoster, Live 02/14/2016    Screening Tests Health Maintenance  Topic Date Due   Diabetic kidney evaluation - Urine ACR  Never done   OPHTHALMOLOGY EXAM  02/25/2015   COVID-19 Vaccine (2 - 2024-25 season) 01/06/2023   HEMOGLOBIN A1C  02/07/2023   FOOT EXAM  04/05/2023   Medicare Annual Wellness (AWV)  12/26/2023   INFLUENZA VACCINE  12/06/2023   DTaP/Tdap/Td (2 - Td or Tdap) 06/30/2024 (Originally 11/13/2020)   Diabetic kidney evaluation - eGFR measurement  11/10/2024   Pneumococcal Vaccine: 50+ Years  Completed   HPV VACCINES  Aged Out   Meningococcal B Vaccine  Aged Out   Lung Cancer Screening  Discontinued   Hepatitis C Screening  Discontinued   Zoster Vaccines- Shingrix   Discontinued    Health Maintenance  Health Maintenance Due  Topic Date Due   Diabetic kidney evaluation - Urine ACR  Never done   OPHTHALMOLOGY EXAM  02/25/2015   COVID-19 Vaccine (2 - 2024-25 season) 01/06/2023   HEMOGLOBIN A1C  02/07/2023   FOOT EXAM  04/05/2023   Medicare Annual Wellness (AWV)  12/26/2023   INFLUENZA VACCINE  12/06/2023   Health Maintenance Items Addressed: See Nurse Notes at the end of this note  Additional Screening:  Vision Screening: Recommended annual ophthalmology exams for early detection of glaucoma and other disorders of the eye. Would you like a referral to an eye doctor? No    Dental Screening: Recommended annual dental exams for proper oral hygiene  Community Resource Referral / Chronic Care Management: CRR required this visit?  No   CCM  required this visit?  No   Plan:    I have personally reviewed and noted the following in the patient's chart:   Medical and social history Use of alcohol, tobacco or illicit drugs  Current medications and supplements including opioid prescriptions. Patient is currently taking opioid prescriptions. Information provided to patient regarding non-opioid alternatives. Patient advised to discuss non-opioid treatment plan with their provider. Functional ability and status Nutritional status Physical activity Advanced directives List of other physicians Hospitalizations, surgeries, and ER visits in previous 12 months Vitals Screenings to include cognitive, depression, and falls Referrals and appointments  In addition, I have reviewed and discussed with patient certain preventive protocols, quality metrics, and best practice recommendations. A written personalized care plan for preventive services as well as general preventive health recommendations were provided to patient.   Eulas Schweitzer L Halbert Jesson, CMA   12/26/2023   After Visit Summary: (Mail) Due to this being a telephonic visit, the after visit summary with patients personalized plan was offered to patient via mail   Notes: Patient is due for a foot exam, a A1C check and a UACR, which order for UACR has been placed today.  Patient stated that he has been to the eye doctor this year.  I have sent a request for his record out today.

## 2023-12-30 ENCOUNTER — Ambulatory Visit: Payer: Medicare Other

## 2024-01-20 ENCOUNTER — Other Ambulatory Visit: Payer: Self-pay | Admitting: Internal Medicine

## 2024-01-20 DIAGNOSIS — M5416 Radiculopathy, lumbar region: Secondary | ICD-10-CM

## 2024-01-20 DIAGNOSIS — M159 Polyosteoarthritis, unspecified: Secondary | ICD-10-CM

## 2024-01-20 MED ORDER — OXYCODONE-ACETAMINOPHEN 10-325 MG PO TABS
1.0000 | ORAL_TABLET | Freq: Four times a day (QID) | ORAL | 0 refills | Status: DC | PRN
Start: 2024-01-20 — End: 2024-02-19

## 2024-01-20 NOTE — Telephone Encounter (Signed)
 Copied from CRM #[id number]. Topic: Clinical - Medication Refill >> Jan 20, 2024  8:36 AM Rosina BIRCH wrote: Medication: oxyCODONE -acetaminophen  (PERCOCET) 10-325 MG tablet  Has the patient contacted their pharmacy? yes (Agent: If no, request that the patient contact the pharmacy for the refill. If patient does not wish to contact the pharmacy document the reason why and proceed with request.) (Agent: If yes, when and what did the pharmacy advise?)  This is the patient's preferred pharmacy:  [id number] [address] [facility] - [address] [place] [address] Phone: [phone] Fax: [phone]  Is this the correct pharmacy for this prescription? Yes If no, delete pharmacy and type the correct one.   Has the prescription been filled recently? Yes  Is the patient out of the medication? Yes  Has the patient been seen for an appointment in the last year OR does the patient have an upcoming appointment? Yes  Can we respond through MyChart? No  Agent: Please be advised that Rx refills may take up to 3 business days. We ask that you follow-up with your pharmacy.

## 2024-01-28 ENCOUNTER — Ambulatory Visit: Admitting: Internal Medicine

## 2024-01-28 ENCOUNTER — Encounter: Payer: Self-pay | Admitting: Internal Medicine

## 2024-01-28 VITALS — BP 138/78 | HR 63 | Temp 97.7°F | Resp 16 | Ht 70.0 in | Wt 186.4 lb

## 2024-01-28 DIAGNOSIS — Z23 Encounter for immunization: Secondary | ICD-10-CM

## 2024-01-28 DIAGNOSIS — D510 Vitamin B12 deficiency anemia due to intrinsic factor deficiency: Secondary | ICD-10-CM | POA: Diagnosis not present

## 2024-01-28 DIAGNOSIS — I5042 Chronic combined systolic (congestive) and diastolic (congestive) heart failure: Secondary | ICD-10-CM

## 2024-01-28 DIAGNOSIS — Z0000 Encounter for general adult medical examination without abnormal findings: Secondary | ICD-10-CM | POA: Diagnosis not present

## 2024-01-28 DIAGNOSIS — R739 Hyperglycemia, unspecified: Secondary | ICD-10-CM

## 2024-01-28 DIAGNOSIS — I10 Essential (primary) hypertension: Secondary | ICD-10-CM | POA: Diagnosis not present

## 2024-01-28 DIAGNOSIS — J42 Unspecified chronic bronchitis: Secondary | ICD-10-CM

## 2024-01-28 DIAGNOSIS — D509 Iron deficiency anemia, unspecified: Secondary | ICD-10-CM

## 2024-01-28 DIAGNOSIS — Z0001 Encounter for general adult medical examination with abnormal findings: Secondary | ICD-10-CM

## 2024-01-28 LAB — CBC WITH DIFFERENTIAL/PLATELET
Basophils Absolute: 0.1 K/uL (ref 0.0–0.1)
Basophils Relative: 0.7 % (ref 0.0–3.0)
Eosinophils Absolute: 0.2 K/uL (ref 0.0–0.7)
Eosinophils Relative: 1.8 % (ref 0.0–5.0)
HCT: 33.7 % — ABNORMAL LOW (ref 39.0–52.0)
Hemoglobin: 10.6 g/dL — ABNORMAL LOW (ref 13.0–17.0)
Lymphocytes Relative: 28.6 % (ref 12.0–46.0)
Lymphs Abs: 2.6 K/uL (ref 0.7–4.0)
MCHC: 31.5 g/dL (ref 30.0–36.0)
MCV: 81.9 fl (ref 78.0–100.0)
Monocytes Absolute: 0.8 K/uL (ref 0.1–1.0)
Monocytes Relative: 9 % (ref 3.0–12.0)
Neutro Abs: 5.4 K/uL (ref 1.4–7.7)
Neutrophils Relative %: 59.9 % (ref 43.0–77.0)
Platelets: 403 K/uL — ABNORMAL HIGH (ref 150.0–400.0)
RBC: 4.11 Mil/uL — ABNORMAL LOW (ref 4.22–5.81)
RDW: 21.8 % — ABNORMAL HIGH (ref 11.5–15.5)
WBC: 9.1 K/uL (ref 4.0–10.5)

## 2024-01-28 LAB — IBC + FERRITIN
Ferritin: 13.6 ng/mL — ABNORMAL LOW (ref 22.0–322.0)
Iron: 20 ug/dL — ABNORMAL LOW (ref 42–165)
Saturation Ratios: 4.7 % — ABNORMAL LOW (ref 20.0–50.0)
TIBC: 428.4 ug/dL (ref 250.0–450.0)
Transferrin: 306 mg/dL (ref 212.0–360.0)

## 2024-01-28 LAB — HEMOGLOBIN A1C: Hgb A1c MFr Bld: 6.4 % (ref 4.6–6.5)

## 2024-01-28 LAB — FOLATE: Folate: 15.9 ng/mL (ref >5.9)

## 2024-01-28 MED ORDER — CYANOCOBALAMIN 1000 MCG/ML IJ SOLN
1000.0000 ug | Freq: Once | INTRAMUSCULAR | Status: AC
  Administered 2024-01-28: 1000 ug via INTRAMUSCULAR

## 2024-01-28 MED ORDER — ACCRUFER 30 MG PO CAPS: 1.0000 | ORAL_CAPSULE | Freq: Two times a day (BID) | ORAL | 1 refills | Status: DC

## 2024-01-28 MED ORDER — ALBUTEROL SULFATE HFA 108 (90 BASE) MCG/ACT IN AERS: 2.0000 | INHALATION_SPRAY | RESPIRATORY_TRACT | 5 refills | Status: DC | PRN

## 2024-01-28 MED ORDER — YUPELRI 175 MCG/3ML IN SOLN: 175.0000 ug | Freq: Every day | RESPIRATORY_TRACT | 1 refills | Status: AC

## 2024-01-28 MED ORDER — COVID-19 MRNA VAC-TRIS(PFIZER) 30 MCG/0.3ML IM SUSY: 0.3000 mL | PREFILLED_SYRINGE | Freq: Once | INTRAMUSCULAR | 0 refills | Status: AC

## 2024-01-28 NOTE — Progress Notes (Addendum)
 Subjective:  Patient ID: Adrian Miller, male    DOB: 04/13/1941  Age: 83 y.o. MRN: [id number]  CC: Diabetes, Annual Exam, Hypertension, and COPD   HPI Adrian Miller presents for a CPX and f/up ----  Discussed the use of AI scribe software for clinical note transcription with the patient, who gave verbal consent to proceed.  History of Present Illness Adrian Miller is an 83 year old male who presents with generalized pain and difficulty breathing.  He experiences generalized pain throughout his body, which is alleviated by oxycodone . He emphasizes the necessity of this medication for his well-being and requests a refill. He also mentions receiving steroid injections, which he finds beneficial.  He has difficulty breathing and uses albuterol  four to five times a day. He reports chest pain and difficulty breathing. He mentions a previous hospital visit in June. He finds quitting smoking challenging, noting that cigarettes help ease his pain.  He occasionally takes an iron  supplement and last received a B12 injection about a year ago. He recalls receiving a booster shot that made him feel better and expresses interest in receiving a B12 shot, which he associates with feeling good.     Outpatient Medications Prior to Visit  Medication Sig Dispense Refill   atorvastatin  (LIPITOR) 40 MG tablet TAKE 1 TABLET BY MOUTH EVERY DAY 90 tablet 0   clopidogrel  (PLAVIX ) 75 MG tablet Take 1 tablet (75 mg total) by mouth daily. 30 tablet 2   KLOR-CON  M20 20 MEQ tablet TAKE 1 TABLET BY MOUTH TWICE A DAY 180 tablet 0   metoprolol  succinate (TOPROL -XL) 25 MG 24 hr tablet TAKE 1/2 TABLET BY MOUTH EVERY DAY 30 tablet 0   nitroGLYCERIN  (NITROSTAT ) 0.4 MG SL tablet Place 1 tablet (0.4 mg total) under the tongue every 5 (five) minutes as needed for chest pain. 30 tablet 2   oxyCODONE -acetaminophen  (PERCOCET) 10-325 MG tablet Take 1 tablet by mouth every 6 (six) hours as needed for pain. 100 tablet 0    pantoprazole  (PROTONIX ) 40 MG tablet TAKE 1 TABLET BY MOUTH EVERY DAY (Patient taking differently: 40 mg every morning.) 90 tablet 0   sacubitril -valsartan  (ENTRESTO ) 24-26 MG Take 1 tablet by mouth 2 (two) times daily. 180 tablet 0   albuterol  (VENTOLIN  HFA) 108 (90 Base) MCG/ACT inhaler Inhale 2 puffs into the lungs every 4 (four) hours as needed for wheezing or shortness of breath. 1 each 0   Ferric Maltol  (ACCRUFER ) 30 MG CAPS Take 1 capsule (30 mg total) by mouth in the morning and at bedtime. 180 capsule 1   No facility-administered medications prior to visit.    ROS Review of Systems  Constitutional:  Negative for appetite change, chills, diaphoresis, fatigue and fever.  HENT: Negative.    Eyes: Negative.   Respiratory:  Positive for cough, shortness of breath and wheezing. Negative for choking, chest tightness and stridor.   Cardiovascular:  Negative for chest pain, palpitations and leg swelling.  Gastrointestinal: Negative.  Negative for abdominal pain, blood in stool, constipation, diarrhea, nausea and vomiting.  Endocrine: Negative.   Genitourinary: Negative.  Negative for difficulty urinating and dysuria.  Musculoskeletal:  Positive for arthralgias, back pain and gait problem. Negative for myalgias.  Skin: Negative.   Neurological:  Negative for dizziness, weakness, light-headedness and numbness.  Hematological:  Negative for adenopathy. Does not bruise/bleed easily.  Psychiatric/Behavioral:  Positive for confusion and decreased concentration.     Objective:  BP 138/78 (BP Location: Left Arm, Patient Position:  Sitting, Cuff Size: Normal)   Pulse 63   Temp 97.7 F (36.5 C) (Oral)   Resp 16   Ht 5' 10 (1.778 m)   Wt 186 lb 6.4 oz (84.6 kg)   SpO2 92%   BMI 26.75 kg/m   BP Readings from Last 3 Encounters:  01/28/24 138/78  11/11/23 138/64  08/01/23 (!) 160/86    Wt Readings from Last 3 Encounters:  01/28/24 186 lb 6.4 oz (84.6 kg)  12/26/23 196 lb (88.9 kg)   08/01/23 196 lb 6.4 oz (89.1 kg)    Physical Exam Vitals reviewed.  Constitutional:      General: He is not in acute distress.    Appearance: He is not toxic-appearing or diaphoretic.  HENT:     Nose: Nose normal.     Mouth/Throat:     Mouth: Mucous membranes are moist.  Eyes:     General: No scleral icterus.    Conjunctiva/sclera: Conjunctivae normal.  Cardiovascular:     Rate and Rhythm: Normal rate and regular rhythm.     Heart sounds: No murmur heard.    No gallop.  Pulmonary:     Effort: Pulmonary effort is normal. No tachypnea or respiratory distress.     Breath sounds: No stridor. Examination of the right-upper field reveals rhonchi. Examination of the left-upper field reveals rhonchi. Examination of the right-middle field reveals rhonchi. Examination of the left-middle field reveals rhonchi. Examination of the right-lower field reveals wheezing and rhonchi. Examination of the left-lower field reveals wheezing and rhonchi. Wheezing and rhonchi present. No decreased breath sounds or rales.  Chest:     Chest wall: No tenderness.  Abdominal:     General: Abdomen is flat.     Palpations: There is no mass.     Tenderness: There is no abdominal tenderness. There is no guarding.     Hernia: No hernia is present.  Musculoskeletal:        General: Normal range of motion.     Cervical back: Neck supple.     Right lower leg: No edema.     Left lower leg: No edema.  Lymphadenopathy:     Cervical: No cervical adenopathy.  Skin:    General: Skin is warm and dry.     Findings: No rash.  Neurological:     General: No focal deficit present.     Mental Status: He is alert.  Psychiatric:        Mood and Affect: Mood normal.        Behavior: Behavior normal.     Lab Results  Component Value Date   WBC 9.1 01/28/2024   HGB 10.6 (L) 01/28/2024   HCT 33.7 (L) 01/28/2024   PLT 403.0 (H) 01/28/2024   GLUCOSE 109 (H) 11/11/2023   CHOL 109 12/19/2022   TRIG 76.0 12/19/2022    HDL 40.10 12/19/2022   LDLDIRECT 47.0 10/22/2019   LDLCALC 53 12/19/2022   ALT 6 11/11/2023   AST 16 11/11/2023   NA 141 11/11/2023   K 3.9 11/11/2023   CL 108 11/11/2023   CREATININE 0.97 11/11/2023   BUN 18 11/11/2023   CO2 22 11/11/2023   TSH 2.67 08/01/2023   PSA 0.87 03/18/2014   INR 1.2 09/20/2021   HGBA1C 6.4 01/28/2024    DG Chest Portable 1 View Result Date: 11/11/2023 CLINICAL DATA:  sob EXAM: PORTABLE CHEST - 1 VIEW COMPARISON:  August 01, 2023 FINDINGS: No focal airspace consolidation, pleural effusion, or pneumothorax. No cardiomegaly. Tortuous  aorta with aortic atherosclerosis. Multilevel thoracic osteophytosis. No acute fracture or destructive lesion. Glenohumeral joint arthroplasty. IMPRESSION: No acute cardiopulmonary abnormality. Electronically Signed   By: Rogelia Myers M.D.   On: 11/11/2023 11:27    Assessment & Plan:   Vitamin B12 deficiency anemia due to intrinsic factor deficiency- His H/H have improved. -     CBC with Differential/Platelet; Future -     Folate; Future -     Cyanocobalamin   Need for immunization against influenza -     Flu vaccine HIGH DOSE PF(Fluzone Trivalent)  Chronic iron  deficiency anemia- Will restart the iron . -     IBC + Ferritin; Future -     ACCRUFeR ; Take 1 capsule (30 mg total) by mouth in the morning and at bedtime.  Dispense: 180 capsule; Refill: 1  Benign essential HTN- BP is well controlled.  Chronic combined systolic and diastolic congestive heart failure, NYHA class 1 (HCC)- No signs of fluid overload.  Chronic bronchitis with wheezing (HCC)- Will start a LAMA. -     Albuterol  Sulfate HFA; Inhale 2 puffs into the lungs every 4 (four) hours as needed for wheezing or shortness of breath.  Dispense: 18 g; Refill: 5 -     Yupelri ; Take 3 mLs (175 mcg total) by nebulization daily.  Dispense: 90 mL; Refill: 1  Chronic hyperglycemia -     Hemoglobin A1c; Future  Encounter for general adult medical examination with  abnormal findings- Exam completed, labs reviewed, vaccines reviewed and updated, no cancer screenings indicated, pt ed material was given.   Other orders -     COVID-19 mRNA Vac-TriS(Pfizer); Inject 0.3 mLs into the muscle once for 1 dose.  Dispense: 0.3 mL; Refill: 0     Follow-up: Return in about 3 months (around 04/28/2024).  Debby Molt, MD

## 2024-01-28 NOTE — Patient Instructions (Signed)
 Health Maintenance, Male  Adopting a healthy lifestyle and getting preventive care are important in promoting health and wellness. Ask your health care provider about:  The right schedule for you to have regular tests and exams.  Things you can do on your own to prevent diseases and keep yourself healthy.  What should I know about diet, weight, and exercise?  Eat a healthy diet    Eat a diet that includes plenty of vegetables, fruits, low-fat dairy products, and lean protein.  Do not eat a lot of foods that are high in solid fats, added sugars, or sodium.  Maintain a healthy weight  Body mass index (BMI) is a measurement that can be used to identify possible weight problems. It estimates body fat based on height and weight. Your health care provider can help determine your BMI and help you achieve or maintain a healthy weight.  Get regular exercise  Get regular exercise. This is one of the most important things you can do for your health. Most adults should:  Exercise for at least 150 minutes each week. The exercise should increase your heart rate and make you sweat (moderate-intensity exercise).  Do strengthening exercises at least twice a week. This is in addition to the moderate-intensity exercise.  Spend less time sitting. Even light physical activity can be beneficial.  Watch cholesterol and blood lipids  Have your blood tested for lipids and cholesterol at 82 years of age, then have this test every 5 years.  You may need to have your cholesterol levels checked more often if:  Your lipid or cholesterol levels are high.  You are older than 82 years of age.  You are at high risk for heart disease.  What should I know about cancer screening?  Many types of cancers can be detected early and may often be prevented. Depending on your health history and family history, you may need to have cancer screening at various ages. This may include screening for:  Colorectal cancer.  Prostate cancer.  Skin cancer.  Lung  cancer.  What should I know about heart disease, diabetes, and high blood pressure?  Blood pressure and heart disease  High blood pressure causes heart disease and increases the risk of stroke. This is more likely to develop in people who have high blood pressure readings or are overweight.  Talk with your health care provider about your target blood pressure readings.  Have your blood pressure checked:  Every 3-5 years if you are 24-52 years of age.  Every year if you are 3 years old or older.  If you are between the ages of 60 and 72 and are a current or former smoker, ask your health care provider if you should have a one-time screening for abdominal aortic aneurysm (AAA).  Diabetes  Have regular diabetes screenings. This checks your fasting blood sugar level. Have the screening done:  Once every three years after age 66 if you are at a normal weight and have a low risk for diabetes.  More often and at a younger age if you are overweight or have a high risk for diabetes.  What should I know about preventing infection?  Hepatitis B  If you have a higher risk for hepatitis B, you should be screened for this virus. Talk with your health care provider to find out if you are at risk for hepatitis B infection.  Hepatitis C  Blood testing is recommended for:  Everyone born from [birth year] through 1964.  Anyone  with known risk factors for hepatitis C.  Sexually transmitted infections (STIs)  You should be screened each year for STIs, including gonorrhea and chlamydia, if:  You are sexually active and are younger than 82 years of age.  You are older than 82 years of age and your health care provider tells you that you are at risk for this type of infection.  Your sexual activity has changed since you were last screened, and you are at increased risk for chlamydia or gonorrhea. Ask your health care provider if you are at risk.  Ask your health care provider about whether you are at high risk for HIV. Your health care provider  may recommend a prescription medicine to help prevent HIV infection. If you choose to take medicine to prevent HIV, you should first get tested for HIV. You should then be tested every 3 months for as long as you are taking the medicine.  Follow these instructions at home:  Alcohol use  Do not drink alcohol if your health care provider tells you not to drink.  If you drink alcohol:  Limit how much you have to 0-2 drinks a day.  Know how much alcohol is in your drink. In the [place] one drink equals one 12 oz bottle of beer (355 mL), one 5 oz glass of wine (148 mL), or one 1 oz glass of hard liquor (44 mL).  Lifestyle  Do not use any products that contain nicotine or tobacco. These products include cigarettes, chewing tobacco, and vaping devices, such as e-cigarettes. If you need help quitting, ask your health care provider.  Do not use street drugs.  Do not share needles.  Ask your health care provider for help if you need support or information about quitting drugs.  General instructions  Schedule regular health, dental, and eye exams.  Stay current with your vaccines.  Tell your health care provider if:  You often feel depressed.  You have ever been abused or do not feel safe at home.  Summary  Adopting a healthy lifestyle and getting preventive care are important in promoting health and wellness.  Follow your health care provider's instructions about healthy diet, exercising, and getting tested or screened for diseases.  Follow your health care provider's instructions on monitoring your cholesterol and blood pressure.  This information is not intended to replace advice given to you by your health care provider. Make sure you discuss any questions you have with your health care provider.  Document Revised: 09/12/2020 Document Reviewed: 09/12/2020  Elsevier Patient Education  2024 [employer].

## 2024-01-30 ENCOUNTER — Ambulatory Visit: Payer: Self-pay | Admitting: Internal Medicine

## 2024-02-04 MED ORDER — ACCRUFER 30 MG PO CAPS: 1.0000 | ORAL_CAPSULE | Freq: Two times a day (BID) | ORAL | 1 refills | Status: AC

## 2024-02-04 NOTE — Addendum Note (Signed)
 Addended by: EZZARD EDSEL HERO on: 02/04/2024 04:13 PM   Modules accepted: Orders

## 2024-02-19 ENCOUNTER — Other Ambulatory Visit: Payer: Self-pay | Admitting: Internal Medicine

## 2024-02-19 DIAGNOSIS — M5416 Radiculopathy, lumbar region: Secondary | ICD-10-CM

## 2024-02-19 DIAGNOSIS — M159 Polyosteoarthritis, unspecified: Secondary | ICD-10-CM

## 2024-02-19 NOTE — Telephone Encounter (Signed)
 Copied from CRM [id number]. Topic: Clinical - Medication Refill >> Feb 19, 2024  9:51 AM Pinkey ORN wrote: Medication: oxyCODONE -acetaminophen  (PERCOCET) 10-325 MG tablet  Has the patient contacted their pharmacy? Yes (Agent: If no, request that the patient contact the pharmacy for the refill. If patient does not wish to contact the pharmacy document the reason why and proceed with request.) (Agent: If yes, when and what did the pharmacy advise?)  This is the patient's preferred pharmacy:  [id number] [address] [facility] - [address] [place] [address] Phone: [phone] Fax: [phone]   Is this the correct pharmacy for this prescription? Yes If no, delete pharmacy and type the correct one.   Has the prescription been filled recently? No  Is the patient out of the medication? Yes  Has the patient been seen for an appointment in the last year OR does the patient have an upcoming appointment? Yes  Can we respond through MyChart? No  Agent: Please be advised that Rx refills may take up to 3 business days. We ask that you follow-up with your pharmacy.

## 2024-02-20 MED ORDER — OXYCODONE-ACETAMINOPHEN 10-325 MG PO TABS
1.0000 | ORAL_TABLET | Freq: Four times a day (QID) | ORAL | 0 refills | Status: DC | PRN
Start: 2024-02-20 — End: 2024-03-19

## 2024-03-19 ENCOUNTER — Other Ambulatory Visit: Payer: Self-pay | Admitting: Internal Medicine

## 2024-03-19 DIAGNOSIS — M5416 Radiculopathy, lumbar region: Secondary | ICD-10-CM

## 2024-03-19 DIAGNOSIS — M159 Polyosteoarthritis, unspecified: Secondary | ICD-10-CM

## 2024-03-19 NOTE — Telephone Encounter (Signed)
 Copied from CRM #[id number]. Topic: Clinical - Medication Refill >> Mar 19, 2024 11:02 AM Suzen RAMAN wrote: Medication: oxyCODONE -acetaminophen  (PERCOCET) 10-325 MG tablet   Has the patient contacted their pharmacy? Yes   This is the patient's preferred pharmacy:  [id number] [address] [place] - [address] [place] [address] Phone: [phone] Fax: [phone]  Is this the correct pharmacy for this prescription? Yes If no, delete pharmacy and type the correct one.   Has the prescription been filled recently? No  Is the patient out of the medication? No  Has the patient been seen for an appointment in the last year OR does the patient have an upcoming appointment? Yes  Can we respond through MyChart? No  Agent: Please be advised that Rx refills may take up to 3 business days. We ask that you follow-up with your pharmacy.

## 2024-03-21 MED ORDER — OXYCODONE-ACETAMINOPHEN 10-325 MG PO TABS: 1.0000 | ORAL_TABLET | Freq: Four times a day (QID) | ORAL | 0 refills | Status: DC | PRN

## 2024-04-20 ENCOUNTER — Other Ambulatory Visit: Payer: Self-pay | Admitting: Internal Medicine

## 2024-04-20 DIAGNOSIS — M159 Polyosteoarthritis, unspecified: Secondary | ICD-10-CM

## 2024-04-20 DIAGNOSIS — M5416 Radiculopathy, lumbar region: Secondary | ICD-10-CM

## 2024-04-20 NOTE — Telephone Encounter (Unsigned)
 Copied from CRM #[id number]. Topic: Clinical - Medication Refill >> Apr 20, 2024  9:06 AM Gustabo D wrote: Medication: oxyCODONE -acetaminophen  (PERCOCET) 10-325 MG tablet  Has the patient contacted their pharmacy? No (Agent: If no, request that the patient contact the pharmacy for the refill. If patient does not wish to contact the pharmacy document the reason why and proceed with request.) (Agent: If yes, when and what did the pharmacy advise?)  This is the patient's preferred pharmacy:  [id number] [address] [facility] - [address] [place] [address] Phone: [phone] Fax: [phone]    Is this the correct pharmacy for this prescription? Yes If no, delete pharmacy and type the correct one.   Has the prescription been filled recently? No  Is the patient out of the medication? Yes  Has the patient been seen for an appointment in the last year OR does the patient have an upcoming appointment? Yes  Can we respond through MyChart? No  Agent: Please be advised that Rx refills may take up to 3 business days. We ask that you follow-up with your pharmacy.

## 2024-04-21 MED ORDER — OXYCODONE-ACETAMINOPHEN 10-325 MG PO TABS: 1.0000 | ORAL_TABLET | Freq: Four times a day (QID) | ORAL | 0 refills | Status: DC | PRN

## 2024-05-06 ENCOUNTER — Other Ambulatory Visit: Payer: Self-pay | Admitting: Internal Medicine

## 2024-05-06 DIAGNOSIS — E785 Hyperlipidemia, unspecified: Secondary | ICD-10-CM

## 2024-05-20 ENCOUNTER — Other Ambulatory Visit: Payer: Self-pay | Admitting: Internal Medicine

## 2024-05-20 DIAGNOSIS — M159 Polyosteoarthritis, unspecified: Secondary | ICD-10-CM

## 2024-05-20 DIAGNOSIS — J42 Unspecified chronic bronchitis: Secondary | ICD-10-CM

## 2024-05-20 DIAGNOSIS — M5416 Radiculopathy, lumbar region: Secondary | ICD-10-CM

## 2024-05-20 MED ORDER — OXYCODONE-ACETAMINOPHEN 10-325 MG PO TABS: 1.0000 | ORAL_TABLET | Freq: Four times a day (QID) | ORAL | 0 refills | Status: AC | PRN

## 2024-05-20 MED ORDER — ALBUTEROL SULFATE HFA 108 (90 BASE) MCG/ACT IN AERS: 2.0000 | INHALATION_SPRAY | RESPIRATORY_TRACT | 5 refills | Status: AC | PRN

## 2024-05-20 NOTE — Telephone Encounter (Signed)
 Copied from CRM [id number]. Topic: Clinical - Medication Refill >> May 20, 2024  9:58 AM Rea ORN wrote: Medication:  oxyCODONE -acetaminophen  (PERCOCET) 10-325 MG tablet  albuterol  (VENTOLIN  HFA) 108 (90 Base) MCG/ACT inhaler   Has the patient contacted their pharmacy? Yes (Agent: If no, request that the patient contact the pharmacy for the refill. If patient does not wish to contact the pharmacy document the reason why and proceed with request.) (Agent: If yes, when and what did the pharmacy advise?)  This is the patient's preferred pharmacy:  [id number] [address] [facility] - [address] [place] [address] Phone: [phone] Fax: [phone] Is this the correct pharmacy for this prescription? Yes If no, delete pharmacy and type the correct one.   Has the prescription been filled recently? No  Is the patient out of the medication? No  Has the patient been seen for an appointment in the last year OR does the patient have an upcoming appointment? Yes  Can we respond through MyChart? No  Agent: Please be advised that Rx refills may take up to 3 business days. We ask that you follow-up with your pharmacy.
